# Patient Record
Sex: Female | Born: 1940 | Race: White | Hispanic: No | Smoking: Never smoker
Health system: Southern US, Community
[De-identification: ages and names within clinical notes are randomized; demographics above are authoritative.]

## PROBLEM LIST (undated history)

## (undated) DIAGNOSIS — K222 Esophageal obstruction: Secondary | ICD-10-CM

## (undated) DIAGNOSIS — H469 Unspecified optic neuritis: Secondary | ICD-10-CM

## (undated) DIAGNOSIS — K581 Irritable bowel syndrome with constipation: Secondary | ICD-10-CM

## (undated) DIAGNOSIS — K219 Gastro-esophageal reflux disease without esophagitis: Secondary | ICD-10-CM

## (undated) DIAGNOSIS — I10 Essential (primary) hypertension: Secondary | ICD-10-CM

## (undated) DIAGNOSIS — E669 Obesity, unspecified: Secondary | ICD-10-CM

## (undated) DIAGNOSIS — D649 Anemia, unspecified: Secondary | ICD-10-CM

## (undated) DIAGNOSIS — H6980 Other specified disorders of Eustachian tube, unspecified ear: Secondary | ICD-10-CM

## (undated) DIAGNOSIS — H543 Unqualified visual loss, both eyes: Secondary | ICD-10-CM

## (undated) DIAGNOSIS — G500 Trigeminal neuralgia: Secondary | ICD-10-CM

## (undated) DIAGNOSIS — I2510 Atherosclerotic heart disease of native coronary artery without angina pectoris: Secondary | ICD-10-CM

## (undated) DIAGNOSIS — K5790 Diverticulosis of intestine, part unspecified, without perforation or abscess without bleeding: Secondary | ICD-10-CM

## (undated) DIAGNOSIS — E785 Hyperlipidemia, unspecified: Secondary | ICD-10-CM

## (undated) DIAGNOSIS — E559 Vitamin D deficiency, unspecified: Secondary | ICD-10-CM

## (undated) DIAGNOSIS — G629 Polyneuropathy, unspecified: Secondary | ICD-10-CM

## (undated) DIAGNOSIS — F32A Depression, unspecified: Secondary | ICD-10-CM

## (undated) DIAGNOSIS — K279 Peptic ulcer, site unspecified, unspecified as acute or chronic, without hemorrhage or perforation: Secondary | ICD-10-CM

## (undated) DIAGNOSIS — F419 Anxiety disorder, unspecified: Secondary | ICD-10-CM

## (undated) DIAGNOSIS — R7989 Other specified abnormal findings of blood chemistry: Secondary | ICD-10-CM

## (undated) DIAGNOSIS — N318 Other neuromuscular dysfunction of bladder: Secondary | ICD-10-CM

## (undated) DIAGNOSIS — T7840XA Allergy, unspecified, initial encounter: Secondary | ICD-10-CM

## (undated) DIAGNOSIS — Z8719 Personal history of other diseases of the digestive system: Secondary | ICD-10-CM

## (undated) DIAGNOSIS — F329 Major depressive disorder, single episode, unspecified: Secondary | ICD-10-CM

## (undated) DIAGNOSIS — H6990 Unspecified Eustachian tube disorder, unspecified ear: Secondary | ICD-10-CM

## (undated) DIAGNOSIS — R945 Abnormal results of liver function studies: Secondary | ICD-10-CM

## (undated) DIAGNOSIS — K649 Unspecified hemorrhoids: Secondary | ICD-10-CM

## (undated) HISTORY — DX: Polyneuropathy, unspecified: G62.9

## (undated) HISTORY — DX: Other specified abnormal findings of blood chemistry: R79.89

## (undated) HISTORY — PX: ABDOMINAL HYSTERECTOMY: SHX81

## (undated) HISTORY — DX: Unspecified eustachian tube disorder, unspecified ear: H69.90

## (undated) HISTORY — PX: CAROTID ENDARTERECTOMY: SUR193

## (undated) HISTORY — DX: Hyperlipidemia, unspecified: E78.5

## (undated) HISTORY — DX: Unspecified hemorrhoids: K64.9

## (undated) HISTORY — DX: Other specified disorders of Eustachian tube, unspecified ear: H69.80

## (undated) HISTORY — DX: Allergy, unspecified, initial encounter: T78.40XA

## (undated) HISTORY — DX: Major depressive disorder, single episode, unspecified: F32.9

## (undated) HISTORY — DX: Trigeminal neuralgia: G50.0

## (undated) HISTORY — DX: Peptic ulcer, site unspecified, unspecified as acute or chronic, without hemorrhage or perforation: K27.9

## (undated) HISTORY — DX: Esophageal obstruction: K22.2

## (undated) HISTORY — DX: Depression, unspecified: F32.A

## (undated) HISTORY — PX: TONSILLECTOMY: SUR1361

## (undated) HISTORY — DX: Anemia, unspecified: D64.9

## (undated) HISTORY — DX: Other neuromuscular dysfunction of bladder: N31.8

## (undated) HISTORY — DX: Vitamin D deficiency, unspecified: E55.9

## (undated) HISTORY — PX: COLONOSCOPY: SHX174

## (undated) HISTORY — DX: Obesity, unspecified: E66.9

## (undated) HISTORY — DX: Unspecified optic neuritis: H46.9

## (undated) HISTORY — DX: Anxiety disorder, unspecified: F41.9

## (undated) HISTORY — DX: Essential (primary) hypertension: I10

## (undated) HISTORY — DX: Irritable bowel syndrome with constipation: K58.1

## (undated) HISTORY — DX: Personal history of other diseases of the digestive system: Z87.19

## (undated) HISTORY — DX: Gastro-esophageal reflux disease without esophagitis: K21.9

## (undated) HISTORY — DX: Abnormal results of liver function studies: R94.5

## (undated) HISTORY — DX: Unqualified visual loss, both eyes: H54.3

## (undated) HISTORY — DX: Diverticulosis of intestine, part unspecified, without perforation or abscess without bleeding: K57.90

## (undated) HISTORY — PX: APPENDECTOMY: SHX54

## (undated) HISTORY — DX: Atherosclerotic heart disease of native coronary artery without angina pectoris: I25.10

---

## 1999-04-12 ENCOUNTER — Inpatient Hospital Stay (HOSPITAL_COMMUNITY): Admission: EM | Admit: 1999-04-12 | Discharge: 1999-04-14 | Payer: Self-pay | Admitting: Internal Medicine

## 1999-04-13 ENCOUNTER — Encounter: Payer: Self-pay | Admitting: Internal Medicine

## 2000-09-24 DIAGNOSIS — K222 Esophageal obstruction: Secondary | ICD-10-CM

## 2000-09-24 HISTORY — PX: UPPER GASTROINTESTINAL ENDOSCOPY: SHX188

## 2000-09-24 HISTORY — DX: Esophageal obstruction: K22.2

## 2000-10-16 ENCOUNTER — Encounter: Payer: Self-pay | Admitting: *Deleted

## 2000-10-18 ENCOUNTER — Encounter (INDEPENDENT_AMBULATORY_CARE_PROVIDER_SITE_OTHER): Payer: Self-pay | Admitting: Specialist

## 2000-10-18 ENCOUNTER — Inpatient Hospital Stay (HOSPITAL_COMMUNITY): Admission: RE | Admit: 2000-10-18 | Discharge: 2000-10-20 | Payer: Self-pay | Admitting: *Deleted

## 2001-08-08 ENCOUNTER — Encounter: Payer: Self-pay | Admitting: Gastroenterology

## 2002-03-02 ENCOUNTER — Ambulatory Visit (HOSPITAL_COMMUNITY): Admission: RE | Admit: 2002-03-02 | Discharge: 2002-03-02 | Payer: Self-pay | Admitting: Gastroenterology

## 2002-03-02 ENCOUNTER — Encounter: Payer: Self-pay | Admitting: Gastroenterology

## 2002-04-21 ENCOUNTER — Encounter: Payer: Self-pay | Admitting: Neurology

## 2002-04-21 ENCOUNTER — Ambulatory Visit (HOSPITAL_COMMUNITY): Admission: RE | Admit: 2002-04-21 | Discharge: 2002-04-21 | Payer: Self-pay | Admitting: Neurology

## 2003-06-24 ENCOUNTER — Ambulatory Visit (HOSPITAL_COMMUNITY): Admission: RE | Admit: 2003-06-24 | Discharge: 2003-06-24 | Payer: Self-pay | Admitting: Internal Medicine

## 2003-06-24 ENCOUNTER — Encounter: Payer: Self-pay | Admitting: Internal Medicine

## 2003-12-20 ENCOUNTER — Inpatient Hospital Stay (HOSPITAL_COMMUNITY): Admission: EM | Admit: 2003-12-20 | Discharge: 2003-12-22 | Payer: Self-pay | Admitting: Internal Medicine

## 2004-02-23 ENCOUNTER — Ambulatory Visit (HOSPITAL_COMMUNITY): Admission: RE | Admit: 2004-02-23 | Discharge: 2004-02-23 | Payer: Self-pay | Admitting: Gastroenterology

## 2004-02-28 ENCOUNTER — Ambulatory Visit (HOSPITAL_COMMUNITY): Admission: RE | Admit: 2004-02-28 | Discharge: 2004-02-28 | Payer: Self-pay | Admitting: Gastroenterology

## 2004-05-24 ENCOUNTER — Encounter: Admission: RE | Admit: 2004-05-24 | Discharge: 2004-05-24 | Payer: Self-pay | Admitting: Orthopedic Surgery

## 2004-10-20 ENCOUNTER — Ambulatory Visit: Payer: Self-pay | Admitting: Internal Medicine

## 2004-10-25 ENCOUNTER — Inpatient Hospital Stay (HOSPITAL_COMMUNITY): Admission: RE | Admit: 2004-10-25 | Discharge: 2004-11-03 | Payer: Self-pay | Admitting: Psychiatry

## 2004-10-25 ENCOUNTER — Ambulatory Visit: Payer: Self-pay | Admitting: Psychiatry

## 2004-10-26 ENCOUNTER — Encounter (HOSPITAL_COMMUNITY): Payer: Self-pay | Admitting: Psychiatry

## 2004-11-09 ENCOUNTER — Ambulatory Visit: Payer: Self-pay | Admitting: Internal Medicine

## 2004-12-04 ENCOUNTER — Other Ambulatory Visit: Admission: RE | Admit: 2004-12-04 | Discharge: 2004-12-04 | Payer: Self-pay | Admitting: Gynecology

## 2004-12-05 ENCOUNTER — Ambulatory Visit: Payer: Self-pay | Admitting: Internal Medicine

## 2004-12-19 ENCOUNTER — Ambulatory Visit: Payer: Self-pay | Admitting: Internal Medicine

## 2005-02-01 ENCOUNTER — Ambulatory Visit: Payer: Self-pay | Admitting: Internal Medicine

## 2005-07-06 ENCOUNTER — Ambulatory Visit: Payer: Self-pay | Admitting: Internal Medicine

## 2005-07-10 ENCOUNTER — Ambulatory Visit: Payer: Self-pay | Admitting: Internal Medicine

## 2005-07-16 ENCOUNTER — Ambulatory Visit: Payer: Self-pay | Admitting: Internal Medicine

## 2005-07-27 ENCOUNTER — Ambulatory Visit: Payer: Self-pay | Admitting: Internal Medicine

## 2005-08-02 ENCOUNTER — Ambulatory Visit: Payer: Self-pay | Admitting: Internal Medicine

## 2005-08-07 ENCOUNTER — Ambulatory Visit: Payer: Self-pay

## 2005-08-08 ENCOUNTER — Ambulatory Visit: Payer: Self-pay | Admitting: Gastroenterology

## 2005-08-09 ENCOUNTER — Ambulatory Visit: Payer: Self-pay | Admitting: Gastroenterology

## 2005-08-09 ENCOUNTER — Encounter (INDEPENDENT_AMBULATORY_CARE_PROVIDER_SITE_OTHER): Payer: Self-pay | Admitting: *Deleted

## 2006-01-31 ENCOUNTER — Ambulatory Visit: Payer: Self-pay | Admitting: Internal Medicine

## 2006-08-01 ENCOUNTER — Ambulatory Visit: Payer: Self-pay | Admitting: Internal Medicine

## 2006-08-26 ENCOUNTER — Ambulatory Visit: Payer: Self-pay | Admitting: Gastroenterology

## 2006-08-26 ENCOUNTER — Ambulatory Visit (HOSPITAL_COMMUNITY): Admission: RE | Admit: 2006-08-26 | Discharge: 2006-08-26 | Payer: Self-pay | Admitting: Gastroenterology

## 2006-08-30 ENCOUNTER — Ambulatory Visit (HOSPITAL_COMMUNITY): Admission: RE | Admit: 2006-08-30 | Discharge: 2006-08-30 | Payer: Self-pay | Admitting: Gastroenterology

## 2006-09-04 ENCOUNTER — Ambulatory Visit: Payer: Self-pay | Admitting: Gastroenterology

## 2007-01-15 ENCOUNTER — Ambulatory Visit: Payer: Self-pay | Admitting: Gastroenterology

## 2007-02-06 ENCOUNTER — Ambulatory Visit: Payer: Self-pay | Admitting: Gastroenterology

## 2007-02-18 ENCOUNTER — Ambulatory Visit: Payer: Self-pay | Admitting: Gastroenterology

## 2007-04-17 ENCOUNTER — Ambulatory Visit: Payer: Self-pay | Admitting: Internal Medicine

## 2007-07-30 ENCOUNTER — Encounter: Payer: Self-pay | Admitting: Internal Medicine

## 2007-07-30 ENCOUNTER — Ambulatory Visit: Payer: Self-pay

## 2007-08-01 DIAGNOSIS — J3089 Other allergic rhinitis: Secondary | ICD-10-CM

## 2007-08-01 DIAGNOSIS — J4520 Mild intermittent asthma, uncomplicated: Secondary | ICD-10-CM | POA: Insufficient documentation

## 2007-08-01 DIAGNOSIS — H6980 Other specified disorders of Eustachian tube, unspecified ear: Secondary | ICD-10-CM

## 2007-08-01 DIAGNOSIS — J302 Other seasonal allergic rhinitis: Secondary | ICD-10-CM

## 2007-08-06 ENCOUNTER — Ambulatory Visit: Payer: Self-pay | Admitting: Internal Medicine

## 2007-09-01 ENCOUNTER — Telehealth (INDEPENDENT_AMBULATORY_CARE_PROVIDER_SITE_OTHER): Payer: Self-pay | Admitting: *Deleted

## 2007-09-02 ENCOUNTER — Encounter: Payer: Self-pay | Admitting: Internal Medicine

## 2007-09-03 ENCOUNTER — Ambulatory Visit: Payer: Self-pay | Admitting: Internal Medicine

## 2007-11-20 ENCOUNTER — Ambulatory Visit: Payer: Self-pay | Admitting: Internal Medicine

## 2007-11-20 DIAGNOSIS — I10 Essential (primary) hypertension: Secondary | ICD-10-CM | POA: Insufficient documentation

## 2007-12-04 ENCOUNTER — Ambulatory Visit: Payer: Self-pay | Admitting: Internal Medicine

## 2008-01-06 DIAGNOSIS — K644 Residual hemorrhoidal skin tags: Secondary | ICD-10-CM | POA: Insufficient documentation

## 2008-01-06 DIAGNOSIS — F323 Major depressive disorder, single episode, severe with psychotic features: Secondary | ICD-10-CM | POA: Insufficient documentation

## 2008-01-06 DIAGNOSIS — F411 Generalized anxiety disorder: Secondary | ICD-10-CM | POA: Insufficient documentation

## 2008-01-06 DIAGNOSIS — K648 Other hemorrhoids: Secondary | ICD-10-CM | POA: Insufficient documentation

## 2008-01-06 DIAGNOSIS — F418 Other specified anxiety disorders: Secondary | ICD-10-CM

## 2008-01-23 ENCOUNTER — Ambulatory Visit: Payer: Self-pay | Admitting: Internal Medicine

## 2008-01-23 LAB — CONVERTED CEMR LAB: Vit D, 1,25-Dihydroxy: 20 — ABNORMAL LOW (ref 30–89)

## 2008-01-26 LAB — CONVERTED CEMR LAB
ALT: 23 units/L (ref 0–35)
Alkaline Phosphatase: 110 units/L (ref 39–117)
Basophils Absolute: 0 10*3/uL (ref 0.0–0.1)
CO2: 32 meq/L (ref 19–32)
Calcium: 8.9 mg/dL (ref 8.4–10.5)
Chloride: 107 meq/L (ref 96–112)
Cholesterol: 290 mg/dL (ref 0–200)
Eosinophils Absolute: 0.1 10*3/uL (ref 0.0–0.7)
GFR calc Af Amer: 92 mL/min
GFR calc non Af Amer: 76 mL/min
HCT: 39.5 % (ref 36.0–46.0)
Hemoglobin: 13.2 g/dL (ref 12.0–15.0)
MCHC: 33.4 g/dL (ref 30.0–36.0)
MCV: 92 fL (ref 78.0–100.0)
Monocytes Absolute: 0.4 10*3/uL (ref 0.1–1.0)
Platelets: 342 10*3/uL (ref 150–400)
RDW: 12.9 % (ref 11.5–14.6)
Triglycerides: 121 mg/dL (ref 0–149)

## 2008-01-27 ENCOUNTER — Encounter: Payer: Self-pay | Admitting: Internal Medicine

## 2008-04-12 ENCOUNTER — Ambulatory Visit: Payer: Self-pay | Admitting: Internal Medicine

## 2008-04-12 LAB — CONVERTED CEMR LAB
Eosinophils Relative: 1.1 % (ref 0.0–5.0)
IgE (Immunoglobulin E), Serum: 46 intl units/mL (ref 0.0–180.0)
Lymphocytes Relative: 26.4 % (ref 12.0–46.0)
MCHC: 33.3 g/dL (ref 30.0–36.0)
Neutrophils Relative %: 67.2 % (ref 43.0–77.0)
Platelets: 322 10*3/uL (ref 150–400)
RBC: 4.51 M/uL (ref 3.87–5.11)
RDW: 12.9 % (ref 11.5–14.6)
WBC: 7 10*3/uL (ref 4.5–10.5)

## 2008-05-06 ENCOUNTER — Telehealth (INDEPENDENT_AMBULATORY_CARE_PROVIDER_SITE_OTHER): Payer: Self-pay | Admitting: *Deleted

## 2008-05-14 ENCOUNTER — Ambulatory Visit: Payer: Self-pay | Admitting: Internal Medicine

## 2008-05-14 DIAGNOSIS — R21 Rash and other nonspecific skin eruption: Secondary | ICD-10-CM | POA: Insufficient documentation

## 2008-05-19 ENCOUNTER — Ambulatory Visit: Payer: Self-pay | Admitting: Internal Medicine

## 2008-05-19 DIAGNOSIS — E559 Vitamin D deficiency, unspecified: Secondary | ICD-10-CM | POA: Insufficient documentation

## 2008-05-19 DIAGNOSIS — E785 Hyperlipidemia, unspecified: Secondary | ICD-10-CM | POA: Insufficient documentation

## 2008-05-24 ENCOUNTER — Ambulatory Visit: Payer: Self-pay | Admitting: Cardiology

## 2008-08-17 ENCOUNTER — Ambulatory Visit: Payer: Self-pay | Admitting: Internal Medicine

## 2008-08-17 DIAGNOSIS — I739 Peripheral vascular disease, unspecified: Secondary | ICD-10-CM

## 2008-08-17 DIAGNOSIS — Z8739 Personal history of other diseases of the musculoskeletal system and connective tissue: Secondary | ICD-10-CM | POA: Insufficient documentation

## 2008-08-17 LAB — CONVERTED CEMR LAB
BUN: 13 mg/dL (ref 6–23)
Calcium: 9.2 mg/dL (ref 8.4–10.5)
Chloride: 105 meq/L (ref 96–112)
Creatinine, Ser: 0.7 mg/dL (ref 0.4–1.2)
GFR calc Af Amer: 107 mL/min
GFR calc non Af Amer: 88 mL/min
Sodium: 141 meq/L (ref 135–145)

## 2008-08-27 ENCOUNTER — Telehealth: Payer: Self-pay | Admitting: Internal Medicine

## 2008-08-30 ENCOUNTER — Encounter: Payer: Self-pay | Admitting: Internal Medicine

## 2008-09-06 ENCOUNTER — Telehealth: Payer: Self-pay | Admitting: Internal Medicine

## 2008-11-12 ENCOUNTER — Ambulatory Visit: Payer: Self-pay | Admitting: Internal Medicine

## 2008-12-02 ENCOUNTER — Telehealth: Payer: Self-pay | Admitting: Internal Medicine

## 2008-12-02 ENCOUNTER — Ambulatory Visit: Payer: Self-pay | Admitting: Internal Medicine

## 2008-12-02 DIAGNOSIS — K620 Anal polyp: Secondary | ICD-10-CM | POA: Insufficient documentation

## 2008-12-02 DIAGNOSIS — K5900 Constipation, unspecified: Secondary | ICD-10-CM | POA: Insufficient documentation

## 2008-12-02 DIAGNOSIS — K5730 Diverticulosis of large intestine without perforation or abscess without bleeding: Secondary | ICD-10-CM | POA: Insufficient documentation

## 2008-12-02 DIAGNOSIS — R1084 Generalized abdominal pain: Secondary | ICD-10-CM

## 2008-12-02 DIAGNOSIS — K589 Irritable bowel syndrome without diarrhea: Secondary | ICD-10-CM

## 2008-12-02 DIAGNOSIS — K621 Rectal polyp: Secondary | ICD-10-CM

## 2008-12-02 LAB — CONVERTED CEMR LAB
Basophils Relative: 0.6 % (ref 0.0–3.0)
Eosinophils Relative: 2.1 % (ref 0.0–5.0)
HCT: 35.6 % — ABNORMAL LOW (ref 36.0–46.0)
Monocytes Absolute: 0.4 10*3/uL (ref 0.1–1.0)
Platelets: 329 10*3/uL (ref 150–400)
RDW: 13 % (ref 11.5–14.6)
WBC: 6.9 10*3/uL (ref 4.5–10.5)

## 2008-12-03 ENCOUNTER — Telehealth: Payer: Self-pay | Admitting: Physician Assistant

## 2008-12-08 ENCOUNTER — Telehealth: Payer: Self-pay | Admitting: Physician Assistant

## 2008-12-20 ENCOUNTER — Ambulatory Visit: Payer: Self-pay | Admitting: Internal Medicine

## 2009-02-17 ENCOUNTER — Encounter: Payer: Self-pay | Admitting: Internal Medicine

## 2009-03-04 ENCOUNTER — Ambulatory Visit: Payer: Self-pay | Admitting: Internal Medicine

## 2009-03-04 ENCOUNTER — Telehealth: Payer: Self-pay | Admitting: Internal Medicine

## 2009-03-04 LAB — CONVERTED CEMR LAB
Basophils Absolute: 0 10*3/uL (ref 0.0–0.1)
Bilirubin Urine: NEGATIVE
Hemoglobin: 12 g/dL (ref 12.0–15.0)
Ketones, ur: NEGATIVE mg/dL
MCV: 89.6 fL (ref 78.0–100.0)
Monocytes Relative: 8.5 % (ref 3.0–12.0)
Neutro Abs: 6.4 10*3/uL (ref 1.4–7.7)
Platelets: 242 10*3/uL (ref 150.0–400.0)
RBC: 3.92 M/uL (ref 3.87–5.11)
Specific Gravity, Urine: <=1.005 (ref 1.000–1.030)
WBC: 9.5 10*3/uL (ref 4.5–10.5)
pH: 5.5 (ref 5.0–8.0)

## 2009-03-07 ENCOUNTER — Telehealth: Payer: Self-pay | Admitting: Internal Medicine

## 2009-05-05 ENCOUNTER — Ambulatory Visit: Payer: Self-pay | Admitting: Internal Medicine

## 2009-05-05 DIAGNOSIS — M810 Age-related osteoporosis without current pathological fracture: Secondary | ICD-10-CM

## 2009-05-05 DIAGNOSIS — E663 Overweight: Secondary | ICD-10-CM

## 2009-05-05 DIAGNOSIS — M7710 Lateral epicondylitis, unspecified elbow: Secondary | ICD-10-CM | POA: Insufficient documentation

## 2009-05-12 ENCOUNTER — Telehealth: Payer: Self-pay | Admitting: Internal Medicine

## 2009-05-13 ENCOUNTER — Ambulatory Visit: Payer: Self-pay | Admitting: Internal Medicine

## 2009-05-13 LAB — CONVERTED CEMR LAB
Bilirubin Urine: NEGATIVE
Ketones, ur: NEGATIVE mg/dL
Nitrite: NEGATIVE
Urobilinogen, UA: 0.2 (ref 0.0–1.0)

## 2009-05-23 ENCOUNTER — Telehealth: Payer: Self-pay | Admitting: Internal Medicine

## 2009-06-07 ENCOUNTER — Encounter: Payer: Self-pay | Admitting: Internal Medicine

## 2009-06-29 ENCOUNTER — Ambulatory Visit: Payer: Self-pay | Admitting: Pulmonary Disease

## 2009-09-09 ENCOUNTER — Encounter: Payer: Self-pay | Admitting: Internal Medicine

## 2009-09-20 ENCOUNTER — Telehealth: Payer: Self-pay | Admitting: Internal Medicine

## 2009-11-11 ENCOUNTER — Ambulatory Visit: Payer: Self-pay | Admitting: Internal Medicine

## 2009-11-15 ENCOUNTER — Ambulatory Visit (HOSPITAL_COMMUNITY)
Admission: RE | Admit: 2009-11-15 | Discharge: 2009-11-15 | Payer: Self-pay | Source: Home / Self Care | Admitting: Internal Medicine

## 2009-11-22 ENCOUNTER — Telehealth (INDEPENDENT_AMBULATORY_CARE_PROVIDER_SITE_OTHER): Payer: Self-pay | Admitting: *Deleted

## 2009-12-09 ENCOUNTER — Ambulatory Visit: Payer: Self-pay | Admitting: Internal Medicine

## 2010-02-06 ENCOUNTER — Encounter: Payer: Self-pay | Admitting: Internal Medicine

## 2010-04-28 ENCOUNTER — Encounter: Payer: Self-pay | Admitting: Internal Medicine

## 2010-05-02 ENCOUNTER — Encounter: Payer: Self-pay | Admitting: Internal Medicine

## 2010-05-08 ENCOUNTER — Ambulatory Visit: Payer: Self-pay | Admitting: Internal Medicine

## 2010-05-08 ENCOUNTER — Telehealth: Payer: Self-pay | Admitting: Internal Medicine

## 2010-05-08 LAB — CONVERTED CEMR LAB
CRP, High Sensitivity: 2.98 (ref 0.00–5.00)
Hemoglobin: 13.4 g/dL (ref 12.0–15.0)
Lymphs Abs: 2.5 10*3/uL (ref 0.7–4.0)
MCV: 91 fL (ref 78.0–100.0)
Monocytes Relative: 5.4 % (ref 3.0–12.0)
Neutro Abs: 4.5 10*3/uL (ref 1.4–7.7)
Neutrophils Relative %: 59.2 % (ref 43.0–77.0)

## 2010-05-09 ENCOUNTER — Telehealth: Payer: Self-pay | Admitting: Internal Medicine

## 2010-05-09 ENCOUNTER — Encounter: Payer: Self-pay | Admitting: Internal Medicine

## 2010-05-11 ENCOUNTER — Ambulatory Visit: Payer: Self-pay | Admitting: Internal Medicine

## 2010-05-11 DIAGNOSIS — H469 Unspecified optic neuritis: Secondary | ICD-10-CM | POA: Insufficient documentation

## 2010-05-11 LAB — CONVERTED CEMR LAB
Alkaline Phosphatase: 117 units/L (ref 39–117)
Bilirubin, Direct: 0.1 mg/dL (ref 0.0–0.3)
CO2: 24 meq/L (ref 19–32)
Cholesterol: 287 mg/dL — ABNORMAL HIGH (ref 0–200)
Creatinine, Ser: 0.9 mg/dL (ref 0.4–1.2)
GFR calc non Af Amer: 70.11 mL/min (ref >60)
Magnesium: 2.3 mg/dL (ref 1.5–2.5)
Potassium: 4.3 meq/L (ref 3.5–5.1)
Sodium: 141 meq/L (ref 135–145)
TSH: 0.35 microintl units/mL (ref 0.35–5.50)
Total CHOL/HDL Ratio: 4
Triglycerides: 84 mg/dL (ref 0.0–149.0)
Vitamin B-12: 250 pg/mL (ref 211–911)

## 2010-05-12 ENCOUNTER — Telehealth: Payer: Self-pay | Admitting: Internal Medicine

## 2010-05-25 ENCOUNTER — Telehealth: Payer: Self-pay | Admitting: Internal Medicine

## 2010-05-26 ENCOUNTER — Encounter: Payer: Self-pay | Admitting: Internal Medicine

## 2010-06-26 ENCOUNTER — Telehealth: Payer: Self-pay | Admitting: Internal Medicine

## 2010-07-06 ENCOUNTER — Ambulatory Visit: Payer: Self-pay | Admitting: Internal Medicine

## 2010-07-06 LAB — CONVERTED CEMR LAB
Basophils Absolute: 0.1 10*3/uL (ref 0.0–0.1)
Basophils Relative: 1.3 % (ref 0.0–3.0)
Eosinophils Relative: 4 % (ref 0.0–5.0)
HDL: 65.2 mg/dL (ref >39.00)
Hemoglobin: 12.9 g/dL (ref 12.0–15.0)
MCHC: 34.2 g/dL (ref 30.0–36.0)
MCV: 90.4 fL (ref 78.0–100.0)
Monocytes Absolute: 0.4 10*3/uL (ref 0.1–1.0)
Platelets: 248 10*3/uL (ref 150.0–400.0)
RBC: 4.18 M/uL (ref 3.87–5.11)
RDW: 14.4 % (ref 11.5–14.6)
Sed Rate: 32 mm/hr — ABNORMAL HIGH (ref 0–22)
Total CHOL/HDL Ratio: 3
Triglycerides: 87 mg/dL (ref 0.0–149.0)

## 2010-07-10 ENCOUNTER — Telehealth: Payer: Self-pay | Admitting: Internal Medicine

## 2010-07-13 ENCOUNTER — Telehealth: Payer: Self-pay | Admitting: Internal Medicine

## 2010-08-09 ENCOUNTER — Encounter: Payer: Self-pay | Admitting: Internal Medicine

## 2010-09-15 ENCOUNTER — Ambulatory Visit (HOSPITAL_COMMUNITY)
Admission: RE | Admit: 2010-09-15 | Discharge: 2010-09-15 | Payer: Self-pay | Source: Home / Self Care | Attending: Psychiatry | Admitting: Psychiatry

## 2010-09-18 ENCOUNTER — Emergency Department (HOSPITAL_COMMUNITY)
Admission: EM | Admit: 2010-09-18 | Discharge: 2010-09-20 | Disposition: A | Discharge status: Other Acute Inpatient Hospital | Payer: Self-pay | Source: Home / Self Care | Admitting: Emergency Medicine

## 2010-10-15 ENCOUNTER — Encounter: Payer: Self-pay | Admitting: Internal Medicine

## 2010-10-26 NOTE — Letter (Signed)
Summary: [facility]   Imported By: Lester Tatamy 05/09/2010 08:15:12  _____________________________________________________________________  External Attachment:    Type:   Image     Comment:   External Document

## 2010-10-26 NOTE — Assessment & Plan Note (Signed)
Summary: YEARLY F/U / # / CD--OK MEN   Vital Signs:  Patient profile:   70 year old female Height:      61 inches Weight:      154 pounds BMI:     29.20 O2 Sat:      96 % on Room air Temp:     98.0 degrees F oral Pulse rate:   76 / minute BP sitting:   140 / 88  (left arm) Cuff size:   regular  Vitals Entered By: Bill Salinas CMA (May 11, 2010 10:43 AM)  O2 Flow:  Room air CC: Pt her for yearly physical/ ab  Does patient need assistance? Ambulation Impaired:Risk for fall Comments Pt lives at [facility] where her meals are prepared for her.   Primary Care Taquanna Borras:  Illene Regulus,  MD  CC:  Pt her for yearly physical/ ab.  History of Present Illness: Patient presents for annual physical exam. She has recently had sudden loss of vision in the right eye approximately 1 week ago. 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 With the sudden loss of vision she was seen by Dr. Elmer Picker. She subsequently has had MRI August 8th that was unremarkable including normal occular and  vascular structures. She was seen by neuroopthalmology at [facility] with a working diagnosis of ischemic optic neuritis. She did have an ESR 36. She has seen Dr. Pollyann Kennedy who will be doing a temporal artery biopsy. She has had additional lab with a normal CRP and CBC. She was seen by Dr. 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  Patient sees Dr. Donell Beers. 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   She has no other medical complaints.   Preventive Screening-Counseling & Management  Alcohol-Tobacco     Alcohol drinks/day: 0     Smoking Status: never  Caffeine-Diet-Exercise     Caffeine use/day: 1 cup daily     Does Patient Exercise: yes     Type of exercise: walking/ arm strecthes     Exercise (avg: min/session): <30     Times/week:  <3  Hep-HIV-STD-Contraception     Dental Visit-last 6 months no     Sun Exposure-Excessive: no  Safety-Violence-Falls     Seat Belt Use: yes     Firearms in the Home: no firearms in the home     Smoke Detectors: yes     Violence in the Home: no risk noted     Sexual Abuse: no     Fall Risk: no falls in the past year      Drug Use:  never.    Current Medications (verified): 1)  Amitriptyline Hcl 50 Mg  Tabs (Amitriptyline Hcl) .... Take One Tablet At Bedtime 2)  Zantac 150 Mg  Tabs (Ranitidine Hcl) .... Take 1 Tablet By Mouth Once A Day As Needed 3)  Chlordiazepoxide Hcl 25 Mg  Caps (Chlordiazepoxide Hcl) .... Take One Capsule  At Bedtime 4)  Chlordiazepoxide Hcl 10 Mg  Caps (Chlordiazepoxide Hcl) .... Take By Mouth As Needed 5)  Miralax   Powd (Polyethylene Glycol 3350) .... Take 1 Dose Daily Adjust For Effect 6)  Proair Hfa 108 (90 Base) Mcg/act  Aers (Albuterol Sulfate) .... 2 Puffs Four Times A Day As Needed 7)  Caltrate 600+d 600-400 Mg-Unit  Tabs (Calcium Carbonate-Vitamin D) .... Take 1  Tablet By Mouth Two Times A Day 8)  Kyolic Liquid .... 1/4 Teaspoon Once Daily 9)  Fish Oil 1000 Mg  Caps (Omega-3 Fatty Acids) .... Take 1 Tablet By Mouth Two Times A Day 10)  Flonase 50 Mcg/act  Susp (Fluticasone Propionate) .Marland Kitchen.. 1-2 Puffs Each Nostril Daily 11)  Alprazolam 0.25 Mg  Tabs (Alprazolam) .... Take 1/2 By Mouth As Needed 12)  Ambien 10 Mg Tabs (Zolpidem Tartrate) .... Take 1/2 At Bedtime If Needed. 13)  Vitamin D 2000 Unit Tabs (Cholecalciferol) .... Once Daily 14)  Coq 10 .... Take 1 Tablet By Mouth Once A Day 15)  Furosemide 20 Mg Tabs (Furosemide) .Marland Kitchen.. 1 By Mouth Qam Diuretic For Peripheral Edema 16)  Aclovate 0.05 % Crea (Alclometasone Dipropionate) .... As Needed 17)  Udo Choice Mvi For Seniors .... Take 1 Tablet By Mouth Once A Day As Needed  Allergies (verified): 1)  ! * Antibiotics Frequently Cause Thrush 2)  ! Penicillin 3)  ! Sulfa 4)  ! Doxycycline 5)  ! Zithromax  (Azithromycin) 6)  ! * Magic Mouthwash 7)  ! 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 father - deceased @ 57: CAD/MI, AAA mother - deceased @ 15: alzheimer;s Neg- breast or colon cancer; DM Sister ovarian cancer brother - lung cancer, AAA  Social History: Reviewed history from 12/09/2009 and no changes required. HSG, business college Married '59- 12yrs/ divorced 2 sons - '60,  '66; 2 granddaughters : Diplomatic Services operational officer, travel Solicitor. Lives - friends home in a studio apartment. Patient never smoked. Did have second hand snmoke exposure childhood and work. Caffeine use/day:  1 cup daily Does Patient Exercise:  yes Dental Care w/in 6 mos.:  no Sun Exposure-Excessive:  no Seat Belt Use:  yes Fall Risk:  no falls in the past year Drug Use:  never  Review of Systems  The patient denies anorexia, fever, weight loss, weight gain, chest pain, syncope, dyspnea on exertion, hemoptysis, abdominal pain, severe indigestion/heartburn, incontinence, transient blindness, difficulty walking, depression, abnormal bleeding, enlarged lymph nodes, and angioedema.    Physical Exam  General:  heavy-set white female in no acute distress who is very voluable in regard to her delusions. Head:  normocephalic, atraumatic, and no abnormalities observed.   Eyes:  deferred to recent exams Ears:  R ear normal and L ear normal.   Nose:  no external deformity and no external erythema.   Mouth:  no oral lesions noted. Neck:  supple, full ROM, and no thyromegaly.   Chest  Wall:  no deformities.   Breasts:  deferred Lungs:  normal respiratory effort, no intercostal retractions, no accessory muscle use, normal breath sounds, and no wheezes.   Heart:  normal rate and regular rhythm.   Abdomen:  soft, non-tender, and normal bowel sounds.   Genitalia:  deferred Msk:  no joint tenderness, no joint swelling, no joint warmth, and no redness over joints.   Pulses:  2+ radial puse Extremities:  no edema or deformity Neurologic:  alert & oriented X3 and cranial nerves II-XII intact except that vision was not checked. EOMI. Strength normal. Normal gait.   Skin:  turgor normal, color normal, and no rashes.  Cervical Nodes:  no anterior cervical adenopathy and no posterior cervical adenopathy.   Psych:  Oriented X3, memory intact for recent and remote, and normally interactive.  Relates a very structured and detailed paranoid delusion which she relays to other staff in a consistent fashion.   Impression & Recommendations:  Problem # 1:  OPTIC NEURITIS (ICD-377.30) Patient with acute visual loss with a working diagnosis, per neuroopthalmology at [facility], ischemic optic neuritis. Both ESR and CRP are not significantly elevated. No palpable temporal artery on exam.   Plan - she is seeing Dr. Azzie Roup for rheumatology and she has been started on high dose steroids           she is scheduled for a temporal artery biopsy.           She will continue to follow with Dr. Elmer Picker  Orders: TLB-Lipid Panel (80061-LIPID) TLB-Hepatic/Liver Function Pnl (80076-HEPATIC) TLB-TSH (Thyroid Stimulating Hormone) (84443-TSH) TLB-B12 + Folate Pnl (21308_65784-O96/EXB) TLB-Magnesium (Mg) (83735-MG)  Problem # 2:  OSTEOPOROSIS (ICD-733.00) Chart reviewed: diagnosed by Dr. Greta Doom in '09. she was intolerant of bisphosphonates. She saw Dr. Talmage Nap for endocrinology in '10 and was started on Forteo. Patient also with a history of vitamin D deficiency. No recent DXA available for review. Patient  does not report taking forteo at this time. She does take vitamin D.  Plan - copy to Dr. Greta Doom who may have a recent DXA scan.  Problem # 3:  IRRITABLE BOWEL SYNDROME (ICD-564.1) Patient has no GI complaints at this time  Problem # 4:  DYSLIPIDEMIA (ICD-272.4)  Lab reveals LDL 195! she is not on medical therapy. In a setting of possible ischemic neuritis will need to work with her to take medical therapy and will try to start with crestor 10mg  once daily.   Her updated medication list for this problem includes:    Crestor 10 Mg Tabs (Rosuvastatin calcium) .Marland Kitchen... 1 by mouth qpm for control of cholesterol  Problem # 5:  ANXIETY (ICD-300.00) Patient continues to follow with Dr. Donell Beers. She seems stable and is living in a semi-supervised setting. Her delusion does not seem to physically threaten her and although she is paranoid she does comply with her medical regimen.  Plan - defer medication management to Dr. Donell Beers  Her updated medication list for this problem includes:    Amitriptyline Hcl 50 Mg Tabs (Amitriptyline hcl) .Marland Kitchen... Take one tablet at bedtime    Chlordiazepoxide Hcl 25 Mg Caps (Chlordiazepoxide hcl) .Marland Kitchen... Take one capsule  at bedtime    Chlordiazepoxide Hcl 10 Mg Caps (Chlordiazepoxide hcl) .Marland Kitchen... Take by mouth as needed    Alprazolam 0.25 Mg Tabs (Alprazolam) .Marland Kitchen... Take 1/2 by mouth as needed  Problem # 6:  ELEVATED BLOOD PRESSURE WITHOUT DIAGNOSIS OF HYPERTENSION (ICD-796.2)  Her updated medication list for this problem includes:    Furosemide 20 Mg Tabs (Furosemide) .Marland Kitchen... 1 by mouth qam diuretic for peripheral edema  Orders: TLB-BMP (Basic Metabolic Panel-BMET) (80048-METABOL)  BP today: 140/88 Prior BP: 114/74 (12/09/2009)  Adequate control on daily furosemide  Problem # 7:  ASTHMA (ICD-493.90) Last visit to Dr. Maple Hudson in March '11 at which time she was stable. She is asymptomatic at today's visit.  Her updated medication list for this problem includes:     Proair Hfa 108 (90 Base) Mcg/act Aers (Albuterol sulfate) .Marland Kitchen... 2 puffs four times a day as needed  Problem # 8:  Preventive Health Care (ICD-V70.0)  Problems as listed. Exam is limited but aside from opthal  issues normal. She is not complaining of facial pain-previously addressed by Dr. Sandria Manly. Lab is significant for very elevated lipids but otherwise normal. Last colonoscopy '08. No mammogram report in EMR but she does see Dr. Greta Doom who may have recent reports. Immunizations: tetnus '10, pneumonia vaccine '10.  Patient lives at [facility]- she does require assistance with ADLs due to vision loss but is independent in personal care. She has well eveloped delusions but does not demonstrate signs of depression. She has increased fall and injury risk due to her visual limitations but is in a safe environment and does exercise caution.  Orders: [facility] -Subsequent Annual Wellness Visit [phone])  Complete Medication List: 1)  Amitriptyline Hcl 50 Mg Tabs (Amitriptyline hcl) .... Take one tablet at bedtime 2)  Zantac 150 Mg Tabs (Ranitidine hcl) .... Take 1 tablet by mouth once a day as needed 3)  Chlordiazepoxide Hcl 25 Mg Caps (Chlordiazepoxide hcl) .... Take one capsule  at bedtime 4)  Chlordiazepoxide Hcl 10 Mg Caps (Chlordiazepoxide hcl) .... Take by mouth as needed 5)  Miralax Powd (Polyethylene glycol 3350) .... Take 1 dose daily adjust for effect 6)  Proair Hfa 108 (90 Base) Mcg/act Aers (Albuterol sulfate) .... 2 puffs four times a day as needed 7)  Caltrate 600+d 600-400 Mg-unit Tabs (Calcium carbonate-vitamin d) .... Take 1 tablet by mouth two times a day 8)  Kyolic Liquid  .... 1/4 teaspoon once daily 9)  Fish Oil 1000 Mg Caps (Omega-3 fatty acids) .... Take 1 tablet by mouth two times a day 10)  Flonase 50 Mcg/act Susp (Fluticasone propionate) .Marland Kitchen.. 1-2 puffs each nostril daily 11)  Alprazolam 0.25 Mg Tabs (Alprazolam) .... Take 1/2 by mouth as needed 12)  Ambien 10 Mg Tabs (Zolpidem  tartrate) .... Take 1/2 at bedtime if needed. 13)  Vitamin D 2000 Unit Tabs (Cholecalciferol) .... Once daily 14)  Coq 10  .... Take 1 tablet by mouth once a day 15)  Furosemide 20 Mg Tabs (Furosemide) .Marland Kitchen.. 1 by mouth qam diuretic for peripheral edema 16)  Aclovate 0.05 % Crea (Alclometasone dipropionate) .... As needed 17)  Udo Choice Mvi For Seniors  .... Take 1 tablet by mouth once a day as needed 18)  Crestor 10 Mg Tabs (Rosuvastatin calcium) .Marland Kitchen.. 1 by mouth qpm for control of cholesterol   Patient: Jean [name] Note: All result statuses are Final unless otherwise noted.  Tests: (1) BMP (METABOL)   Sodium                    141 mEq/L                   135-145   Potassium                 4.3 mEq/L                   3.5-5.1   Chloride                  105 mEq/L                   96-112   Carbon Dioxide            24 mEq/L                    19-32   Glucose                   88  mg/dL                    16-10   BUN                  [H]  25 mg/dL                    9-60   Creatinine                0.9 mg/dL                   4.5-4.0   Calcium                   9.3 mg/dL                   9.8-11.9   GFR                       70.11 mL/min                >60  Tests: (2) Lipid Panel (LIPID)   Cholesterol          [H]  287 mg/dL                   1-478     ATP III Classification            Desirable:  < 200 mg/dL                    Borderline High:  200 - 239 mg/dL               High:  > = 240 mg/dL   Triglycerides             84.0 mg/dL                  2.9-562.1     Normal:  <150 mg/dL     Borderline High:  308 - 199 mg/dL   HDL                       65.78 mg/dL                 >46.96   VLDL Cholesterol          16.8 mg/dL                  2.9-52.8  CHO/HDL Ratio:  CHD Risk                             4                    Men          Women     1/2 Average Risk     3.4          3.3     Average Risk          5.0          4.4     2X Average Risk          9.6          7.1     3X  Average Risk          15.0          11.0  Tests: (3) Hepatic/Liver Function Panel (HEPATIC)   Total Bilirubin           0.5 mg/dL                   0.4-5.4   Direct Bilirubin          0.1 mg/dL                   0.9-8.1   Alkaline Phosphatase      117 U/L                     39-117   AST                       25 U/L                      0-37   ALT                       22 U/L                      0-35   Total Protein             6.8 g/dL                    1.9-1.4   Albumin                   4.0 g/dL                    7.8-2.9  Tests: (4) TSH (TSH)   FastTSH                   0.35 uIU/mL                 0.35-5.50  Tests: (5) B12 + Folate Panel (B12/FOL)   Vitamin B12               250 pg/mL                   211-911   Folate                    12.6 ng/mL     Deficient  0.4 - 3.4 ng/mL     Indeterminate  3.4 - 5.4 ng/mL     Normal  >5.4 ng/mL  Tests: (6) Magnesium (MG)   Magnesium                 2.3 mg/dL                   5.6-2.1  Tests: (7) Cholesterol LDL - Direct (DIRLDL)  Cholesterol LDL - Direct                             195.0 mg/dL     Optimal:  <308 mg/dL     Near or Above Optimal:  100-129 mg/dL     Borderline High:  657-846 mg/dL     High:  962-952 mg/dL     Very High:  >841 mg/dL Prescriptions: CRESTOR 10 MG TABS (ROSUVASTATIN CALCIUM) 1 by mouth qpm for control of cholesterol  #30 x 12   Entered and Authorized by:   Jacques Navy MD   Signed by:  Jacques Navy MD on 05/12/2010   Method used:   Electronically to        [facility] (retail)       [address]       [place], [place]  [address]       Ph: [phone] or [phone]       Fax: [phone]   RxID:   [id number]   Preventive Care Screening  Last Flu Shot:    Date:  07/05/2009    Results:  given

## 2010-10-26 NOTE — Assessment & Plan Note (Signed)
Summary: rov 1 month ///kp   Primary Provider/Referring Provider:  Illene Regulus,  MD  CC:  follow up visit.  History of Present Illness: 11/12/08-Asthma, allergic rhinitis, eustachian dysfunction Good winter. Plans to go on a vegetarian diet.had seasonal flu vax. No colds at all. 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 She would like to lose weight. Had a pleuritic pain after bruising rib- saw orthopod and is resolving. Uses Proair rescue inhaler very occasionally- helps.   November 11, 2009- Asthma, allergic rhinitis, eustachian dysfunction Saw Dr Shelle Iron for acut visit in October.  After trip in late September she has felt pressure frontal area and tender right eyebrow ridge  Sometimes blows from nose. Today tender in nose. For a year, if she bends over, she has been able to hear some wheeze. Needs flu vax.  December 09, 2009- Asthma, allergic rhinitis, eustachian dysfunction She notes some mild frontal headache c/w pollen, but not bad enough that she wants to do anything. CT sinuses -normal CXR- low lung volumes chronic, some basilar atelectasis, NAD stable.      Current Medications (verified): 1)  Amitriptyline Hcl 50 Mg  Tabs (Amitriptyline Hcl) .... Take One Tablet At Bedtime 2)  Zantac 150 Mg  Tabs (Ranitidine Hcl) .... Take 1 Tablet By Mouth Once A Day As Needed 3)  Chlordiazepoxide Hcl 25 Mg  Caps (Chlordiazepoxide Hcl) .... Take One Capsule  At Bedtime 4)  Chlordiazepoxide Hcl 10 Mg  Caps (Chlordiazepoxide Hcl) .... Take By Mouth As Needed 5)  Miralax   Powd (Polyethylene Glycol 3350) .... Take 1 Dose Daily Adjust For Effect 6)  Proair Hfa 108 (90 Base) Mcg/act  Aers (Albuterol Sulfate) .... 2 Puffs Four Times A Day As Needed 7)  Caltrate 600+d 600-400 Mg-Unit  Tabs (Calcium Carbonate-Vitamin D) .... Take 1 Tablet By Mouth Two Times A Day 8)  Kyolic Liquid .... 1/4 Teaspoon Once Daily 9)  Fish Oil 1000 Mg  Caps  (Omega-3 Fatty Acids) .... Take 1 Tablet By Mouth Two Times A Day 10)  Flonase 50 Mcg/act  Susp (Fluticasone Propionate) .Marland Kitchen.. 1-2 Puffs Each Nostril Daily 11)  Alprazolam 0.25 Mg  Tabs (Alprazolam) .... Take 1/2 By Mouth As Needed 12)  Ambien 10 Mg Tabs (Zolpidem Tartrate) .... Take 1/2 At Bedtime If Needed. 13)  Vitamin D 2000 Unit Tabs (Cholecalciferol) .... Once Daily 14)  Coq 10 .... Take 1 Tablet By Mouth Once A Day 15)  Furosemide 20 Mg Tabs (Furosemide) .Marland Kitchen.. 1 By Mouth Qam Diuretic For Peripheral Edema 16)  Aclovate 0.05 % Crea (Alclometasone Dipropionate) .... As Needed 17)  Udo Choice Mvi For Seniors .... Take 1 Tablet By Mouth Once A Day As Needed  Allergies (verified): 1)  ! * Antibiotics Frequently Cause Thrush 2)  ! Penicillin 3)  ! Sulfa 4)  ! Doxycycline 5)  ! Zithromax (Azithromycin) 6)  ! * Magic Mouthwash 7)  ! * Shellfish  Past History:  Past Medical History: Last updated: 12/20/2008 ESOPHAGEAL REFLUX (ICD-530.81) EUSTACHIAN TUBE DYSFUNCTION (ICD-381.81) ALLERGIC RHINITIS (ICD-477.9) ASTHMA (ICD-493.90) irritable bowel/ constipation diverticulosis peptic ulcer disease osteoporosis peripheral neuropathy trigeminal neuralgia (prior injury)   Past Surgical History: Last updated: 12/04/2007 tonsillectomy appendectomy hysterectomy carotid endarterectomy  Family History: Last updated: 08-30-2008 father - deceased @ 10: CAD/MI, AAA mother - deceased @ 65: alzheimer;s Neg- breast or colon cancer; DM Sister ovarian cancer brother - lung cancer, AAA  Social History: Last updated: 12/09/2009 HSG,  business college Married '59- 70yrs/ divorced 2 sons - '60,  '66; 2 granddaughters : Diplomatic Services operational officer, travel Solicitor. Lives - friends home in a studio apartment. Patient never smoked. Did have second hand snmoke exposure childhood and work.  Risk Factors: Smoking Status: never (12/04/2007)  Social History: HSG, business college Married '59- 10yrs/ divorced 2  sons - '60,  '66; 2 granddaughters : Diplomatic Services operational officer, travel Solicitor. Lives - friends home in a studio apartment. Patient never smoked. Did have second hand snmoke exposure childhood and work.  Review of Systems      See HPI  The patient denies anorexia, fever, weight loss, weight gain, vision loss, decreased hearing, hoarseness, chest pain, syncope, dyspnea on exertion, peripheral edema, prolonged cough, headaches, hemoptysis, abdominal pain, and severe indigestion/heartburn.    Vital Signs:  Patient profile:   70 year old female Height:      61 inches Weight:      154.25 pounds BMI:     29.25 O2 Sat:      98 % on Room air Pulse rate:   84 / minute BP sitting:   114 / 74  (left arm) Cuff size:   regular  Vitals Entered By: Reynaldo Minium CMA (December 09, 2009 2:39 PM)  O2 Flow:  Room air  Physical Exam  Additional Exam:  General: A/Ox3; pleasant and cooperative, NAD, SKIN: no rash, lesions NODES: no lymphadenopathy HEENT: [place]/AT, EOM- WNL, Conjuctivae- clear, PERRLA, TM-WNL, Nose- clear, Throat- clear and wnl,. Mallampati  II NECK: Supple w/ fair ROM, JVD- none, normal carotid impulses w/o bruits Thyroid-  CHEST: Clear to P&A HEART: RRR, no m/g/r heard ABDOMEN: Soft and nl; XBM:WUXL, nl pulses, no edema  NEURO: Grossly intact to observation      Impression & Recommendations:  Problem # 1:  ALLERGIC RHINITIS (ICD-477.9)  Flonase is doing well. Right now she doesn't need more.  Her updated medication list for this problem includes:    Flonase 50 Mcg/act Susp (Fluticasone propionate) .Marland Kitchen... 1-2 puffs each nostril daily  Problem # 2:  ASTHMA (ICD-493.90) Good control   Other Orders: Est. Patient Level III (24401)  Patient Instructions: 1)  Please schedule a follow-up appointment in 1 year. 2)  Continue the fluticasone/ flonase nose spray through the Spring. Call if needed sooner.

## 2010-10-26 NOTE — Letter (Signed)
Summary: [facility]  [facility]   Imported By: Sherian Rein 02/09/2010 15:01:20  _____________________________________________________________________  External Attachment:    Type:   Image     Comment:   External Document

## 2010-10-26 NOTE — Progress Notes (Signed)
Summary: CRESTOR  Phone Note Call from Patient Call back at Home Phone [phone]   Summary of Call: Patient is requesting generic alternative to crestor.  Initial call taken by: Lamar Sprinkles, CMA,  June 26, 2010 8:57 AM  Follow-up for Phone Call        1) No -With the medical problems present - occlusion of retinal vessels causing blindness and the need to dramatically reduce cholesterol from LDL=195 I do not believe there is a generic that can be given at a safe dose that will equal crestor. 2) needs follow-up lab since starting crestor - with dose adjustment as needed to get LDl to 80 or less.  272.4  Follow-up by: Jacques Navy MD,  June 26, 2010 9:32 AM  Additional Follow-up for Phone Call Additional follow up Details #1::        Left vm with above and to call office regarding need for labs.  Additional Follow-up by: Lamar Sprinkles, CMA,  June 26, 2010 5:34 PM    Additional Follow-up for Phone Call Additional follow up Details #2::    Pt returned call and had questions, left mess to call office back..............Marland KitchenLamar Sprinkles, CMA  June 28, 2010 5:51 PM   no answer...............Marland KitchenLamar Sprinkles, CMA  June 29, 2010 6:10 PM   Spoke with patient, she will continue crestor. Pt also needs, labs from Dr Elmer Picker, Order in [facility].  Follow-up by: Lamar Sprinkles, CMA,  July 03, 2010 11:40 AM

## 2010-10-26 NOTE — Letter (Signed)
Summary: [facility]  [facility]   Imported By: Lester Tishomingo 09/26/2009 08:00:13  _____________________________________________________________________  External Attachment:    Type:   Image     Comment:   External Document

## 2010-10-26 NOTE — Progress Notes (Signed)
Summary: CALL  Phone Note Call from Patient   Summary of Call: Patient is requesting a call back, has additional questions regarding last phone note.  Initial call taken by: Lamar Sprinkles, CMA,  May 25, 2010 9:46 AM  Follow-up for Phone Call        Spoke w/pt, she would like copy of labs mailed to her home, done. Pt wants to know if we can test her prednisone levels. She is being treated by Dr Dareen Piano w/60mg  daily. She says she feels no change from being on medication.   Per MD, no test for prednisone level avail, needs to talk to dr Dareen Piano. Follow-up by: Lamar Sprinkles, CMA,  May 25, 2010 11:53 AM  Additional Follow-up for Phone Call Additional follow up Details #1::        Pt informed, she says she will discuss with dr Elmer Picker who is "the head of all this" and see what she says.  Additional Follow-up by: Lamar Sprinkles, CMA,  May 25, 2010 11:54 AM

## 2010-10-26 NOTE — Progress Notes (Signed)
  Phone Note From Other Clinic   Summary of Call: Dr [facility] office is requesting pt to have labs at our office so she can take to MD at [facility]. They will fax order for requested labs to my attention.  Initial call taken by: Lamar Sprinkles, CMA,  May 08, 2010 9:48 AM  Follow-up for Phone Call        Patient brought in lab request. See EMR, labs were completed.  Follow-up by: Lamar Sprinkles, CMA,  May 09, 2010 1:47 PM

## 2010-10-26 NOTE — Letter (Signed)
Summary: [facility]  [facility]   Imported By: Sherian Rein 08/15/2010 12:13:04  _____________________________________________________________________  External Attachment:    Type:   Image     Comment:   External Document

## 2010-10-26 NOTE — Progress Notes (Signed)
Summary: cholestrol level  Phone Note Call from Patient   Caller: Patient Summary of Call: Wants to know results of blood work, specifically her cholesterol level--good, bad, together, since been on crestor.  Initial call taken by: Alysia Penna,  July 13, 2010 2:13 PM  Follow-up for Phone Call        LDL 86 - fabulous, HDL good, liver functions normal, CRP normal Follow-up by: Jacques Navy MD,  July 13, 2010 3:56 PM  Additional Follow-up for Phone Call Additional follow up Details #1::        informed pt of results Additional Follow-up by: Ami Bullins CMA,  July 13, 2010 4:04 PM

## 2010-10-26 NOTE — Progress Notes (Signed)
Summary: Referral  Phone Note Outgoing Call   Summary of Call: Plese call patient: cholesterol is very high. Needs to start crestor 10mg  once a day in PM. Rx eScribed to [facility] [address] Will need follow-up lab: lipid 272.4, hepatic 995.20, in 3 weeks. Remainder of lab normal. Initial call taken by: Jacques Navy MD,  May 12, 2010 4:38 AM  Follow-up for Phone Call        Phone busy, will try back later Follow-up by: Ami Bullins CMA,  May 12, 2010 11:49 AM  Additional Follow-up for Phone Call Additional follow up Details #1::        pt said she has tried crestor and it affects her muscles.  what do you advise? Additional Follow-up by: Ami Bullins CMA,  May 12, 2010 4:18 PM    Additional Follow-up for Phone Call Additional follow up Details #2::    she will need to be referred to lipid clinic. [facility] notified Follow-up by: Jacques Navy MD,  May 12, 2010 5:17 PM  Additional Follow-up for Phone Call Additional follow up Details #3:: Details for Additional Follow-up Action Taken: Left message on machine to call back to office. Lucious Groves CMA  May 15, 2010 1:32 PM   Per [facility] notes pt declined apt................Marland KitchenLamar Sprinkles, CMA  May 20, 2010 9:37 AM   Called pt: she has decided to take crestor.  Additional Follow-up by: Jacques Navy MD,  May 22, 2010 12:14 PM

## 2010-10-26 NOTE — Assessment & Plan Note (Signed)
Summary: 12 months/apc   Primary Provider/Referring Provider:  Illene Regulus,  MD  CC:  Yearly follow up visit-rattles/wheezing at times. .  History of Present Illness: 05/14/08- 70 year old woman returns for follow-up of allergic rhinitis and asthma.  Was complaining last visit that her face hurt.  Associated this with flushing or rash on cheeks of her chest and arms.  She went to an urgent care and was referred to for a second opinion  Dermatology evaluation after being given Depo-Medrol and prednisone.  The dermatology visit is pending.  She uses no make up.  Her first dermatologist biopsied. her skin and gave a cortisone topical preparation.  She still puts a cool pack on her upper chest at bed time.  She is watching her diet and exercising and with this  feels better. Laboratory: CBC, normal.  In vitro/RAST testing negative, IgE 46.  11/12/08-Asthma, allergic rhinitis, eustachian dysfunction Good winter. Plans to go on a vegetarian diet.had seasonal flu vax. No colds at all. 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 She would like to lose weight. Had a pleuritic pain after bruising rib- saw orthopod and is resolving. Uses Proair rescue inhaler very occasionally- helps.   November 11, 2009- Asthma, allergic rhinitis, eustachian dysfunction Saw Dr Shelle Iron for acut visit in October.  After trip in late September she has felt pressure frontal area and tender right eyebrow ridge  Sometimes blows from nose. Today tender in nose. For a year, if she bends over, she has been able to hear some wheeze. Needs flu vax.   Current Medications (verified): 1)  Amitriptyline Hcl 50 Mg  Tabs (Amitriptyline Hcl) .... Take One Tablet At Bedtime 2)  Zantac 150 Mg  Tabs (Ranitidine Hcl) .... Take 1 Tablet By Mouth Once A Day As Needed 3)  Chlordiazepoxide Hcl 25 Mg  Caps (Chlordiazepoxide Hcl) .... Take One Capsule  At Bedtime 4)  Chlordiazepoxide Hcl 10  Mg  Caps (Chlordiazepoxide Hcl) .... Take By Mouth As Needed 5)  Miralax   Powd (Polyethylene Glycol 3350) .... Take 1 Dose Daily Adjust For Effect 6)  Proair Hfa 108 (90 Base) Mcg/act  Aers (Albuterol Sulfate) .... 2 Puffs Four Times A Day As Needed 7)  Caltrate 600+d 600-400 Mg-Unit  Tabs (Calcium Carbonate-Vitamin D) .... Take 1 Tablet By Mouth Two Times A Day 8)  Kyolic Liquid .... 1/4 Teaspoon Once Daily 9)  Fish Oil 1000 Mg  Caps (Omega-3 Fatty Acids) .... Take 1 Tablet By Mouth Two Times A Day 10)  Flonase 50 Mcg/act  Susp (Fluticasone Propionate) .Marland Kitchen.. 1-2 Puffs Each Nostril Daily 11)  Alprazolam 0.25 Mg  Tabs (Alprazolam) .... Take 1/2 By Mouth As Needed 12)  Ambien 10 Mg Tabs (Zolpidem Tartrate) .... Take 1/2 At Bedtime If Needed. 13)  Vitamin D 2000 Unit Tabs (Cholecalciferol) .... Once Daily 14)  Coq 10 .... Take 1 Tablet By Mouth Once A Day 15)  Furosemide 20 Mg Tabs (Furosemide) .Marland Kitchen.. 1 By Mouth Qam Diuretic For Peripheral Edema 16)  Aclovate 0.05 % Crea (Alclometasone Dipropionate) .... As Needed 17)  Udo Choice Mvi For Seniors .... Take 1 Tablet By Mouth Once A Day As Needed  Allergies (verified): 1)  ! * Antibiotics Frequently Cause Thrush 2)  ! Penicillin 3)  ! Sulfa 4)  ! Doxycycline 5)  ! Zithromax (Azithromycin) 6)  ! * Magic Mouthwash 7)  ! * Shellfish  Past History:  Past Medical History: Last  updated: 12/20/2008 ESOPHAGEAL REFLUX (ICD-530.81) EUSTACHIAN TUBE DYSFUNCTION (ICD-381.81) ALLERGIC RHINITIS (ICD-477.9) ASTHMA (ICD-493.90) irritable bowel/ constipation diverticulosis peptic ulcer disease osteoporosis peripheral neuropathy trigeminal neuralgia (prior injury)   Past Surgical History: Last updated: 12/04/2007 tonsillectomy appendectomy hysterectomy carotid endarterectomy  Family History: Last updated: Sep 11, 2008 father - deceased @ 40: CAD/MI, AAA mother - deceased @ 61: alzheimer;s Neg- breast or colon cancer; DM Sister ovarian  cancer brother - lung cancer, AAA  Social History: Last updated: 11-Sep-2008 HSG, business college Married '59- 41yrs/ divorced 2 sons - '60,  '66; 2 granddaughters : Diplomatic Services operational officer, travel Solicitor. Lives - friends home in a studio apartment. Patient never smoked.   Risk Factors: Smoking Status: never (12/04/2007)  Review of Systems      See HPI  The patient denies anorexia, fever, weight loss, weight gain, vision loss, decreased hearing, hoarseness, chest pain, syncope, dyspnea on exertion, peripheral edema, prolonged cough, headaches, hemoptysis, abdominal pain, and severe indigestion/heartburn.    Vital Signs:  Patient profile:   70 year old female Height:      61 inches Weight:      151.50 pounds BMI:     28.73 O2 Sat:      98 % on Room air Pulse rate:   82 / minute BP sitting:   118 / 80  (left arm) Cuff size:   regular  Vitals Entered By: Reynaldo Minium CMA (November 11, 2009 2:19 PM)  O2 Flow:  Room air  Physical Exam  Additional Exam:  General: A/Ox3; pleasant and cooperative, NAD, SKIN: no rash, lesions NODES: no lymphadenopathy HEENT: [place]/AT, EOM- WNL, Conjuctivae- clear, PERRLA, TM-WNL, Nose- clear, Throat- clear and wnl,. Mallampati  II NECK: Supple w/ fair ROM, JVD- none, normal carotid impulses w/o bruits Thyroid-  CHEST: Clear to P&A HEART: RRR, no m/g/r heard ABDOMEN: Soft and nl; KGM:WNUU, nl pulses, no edema  NEURO: Grossly intact to observation      Impression & Recommendations:  Problem # 1:  ALLERGIC RHINITIS (ICD-477.9)  She complains of tenderness and we discussed options. I can get limited CTwith question as to whether she is describing a frontal sinusitis. Her updated medication list for this problem includes:    Flonase 50 Mcg/act Susp (Fluticasone propionate) .Marland Kitchen... 1-2 puffs each nostril daily  Problem # 2:  ASTHMA (ICD-493.90) She is anxious. I don't hear much now, but we will update CXR.  Medications Added to Medication List This  Visit: 1)  Udo Choice Mvi For Seniors  .... Take 1 tablet by mouth once a day as needed  Other Orders: Est. Patient Level III (72536) T-2 View CXR (71020TC) Radiology Referral (Radiology)  Patient Instructions: 1)  Please schedule a follow-up appointment in 1 month. 2)  A chest x-ray has been recommended.  Your imaging study may require preauthorization.  3)  A Limited Sinus CT has been recommended.  Your imaging study may require preauthorization.  4)  Scripts for [employer] were sent to your drug store Prescriptions: FLONASE 50 MCG/ACT  SUSP (FLUTICASONE PROPIONATE) 1-2 puffs each nostril daily  #1 x prn   Entered and Authorized by:   Waymon Budge MD   Signed by:   Waymon Budge MD on 11/11/2009   Method used:   Electronically to        [id number] ([facility])       [address]       [place], [place]  [address]       Ph: [phone] or [phone]  Fax: [phone]   RxID:   [id number] PROAIR HFA 108 (90 BASE) MCG/ACT  AERS (ALBUTEROL SULFATE) 2 puffs four times a day as needed  #1 x 12   Entered and Authorized by:   Waymon Budge MD   Signed by:   Waymon Budge MD on 11/11/2009   Method used:   Electronically to        [id number] (retail)       [address]       [place], [place]  [address]       Ph: [phone] or [phone]       Fax: [phone]   RxID:   [id number]

## 2010-10-26 NOTE — Progress Notes (Signed)
Summary: UPDATE  Phone Note From Other Clinic   Summary of Call: Spoke with Dr Elmer Picker - She spoke w/Dr Dareen Piano this afternoon who will be contacting the patient for consult today or first thing in the am. Dr Dareen Piano does high dose solumedrol in his office. Pt was also seen by Dr Pollyann Kennedy this am who will be setting pt up for termporal artery biopsy later this week. Both Dr Elmer Picker & Dareen Piano are leaning against admission at this time.  Initial call taken by: Lamar Sprinkles, CMA,  May 09, 2010 4:02 PM  Follow-up for Phone Call        [facility]. Info noted. Follow-up by: Jacques Navy MD,  May 10, 2010 5:01 AM

## 2010-10-26 NOTE — Letter (Signed)
Summary: [facility]  [facility]   Imported By: Lester [place] 05/11/2010 10:27:24  _____________________________________________________________________  External Attachment:    Type:   Image     Comment:   External Document

## 2010-10-26 NOTE — Letter (Signed)
Summary: [facility]  [facility]   Imported By: Lester [place]man 06/07/2010 12:53:56  _____________________________________________________________________  External Attachment:    Type:   Image     Comment:   External Document

## 2010-10-26 NOTE — Progress Notes (Signed)
Summary: test results  Phone Note Call from Patient Call back at Home Phone [phone]   Caller: Patient Call For: young Summary of Call: Wants results of ct sinus Initial call taken by: Darletta Moll,  November 22, 2009 9:38 AM  Follow-up for Phone Call        called and spoke with pt. pt aware of ct and cxr results.  Aundra Millet Reynolds LPN  November 23, 6211 9:42 AM

## 2010-10-26 NOTE — Letter (Signed)
Summary: [facility]  [facility]   Imported By: Sherian Rein 05/31/2010 15:49:31  _____________________________________________________________________  External Attachment:    Type:   Image     Comment:   External Document

## 2010-10-26 NOTE — Progress Notes (Signed)
Summary: Recent labs   Phone Note Other Incoming   Caller: Dr Deirdre Evener office Summary of Call: please fax 07-06-10 labs to Dr. Deirdre Evener office @ [phone]. Initial call taken by: Lanier Prude, [facility]),  July 10, 2010 2:51 PM  Follow-up for Phone Call        Done Follow-up by: Lamar Sprinkles, CMA,  July 12, 2010 5:49 PM

## 2010-10-26 NOTE — Letter (Signed)
Summary: [facility]  [facility]   Imported By: Lennie Odor 09/29/2009 12:03:24  _____________________________________________________________________  External Attachment:    Type:   Image     Comment:   External Document

## 2010-10-26 NOTE — Letter (Signed)
Summary: [facility]   Imported By: Lester Ilion 05/18/2010 09:37:50  _____________________________________________________________________  External Attachment:    Type:   Image     Comment:   External Document

## 2010-10-26 NOTE — Letter (Signed)
Summary: [facility]  [facility]   Imported By: Lester [place] 05/11/2010 10:25:55  _____________________________________________________________________  External Attachment:    Type:   Image     Comment:   External Document

## 2010-10-31 ENCOUNTER — Other Ambulatory Visit: Payer: Self-pay | Admitting: Internal Medicine

## 2010-10-31 ENCOUNTER — Ambulatory Visit (INDEPENDENT_AMBULATORY_CARE_PROVIDER_SITE_OTHER): Payer: MEDICARE | Admitting: Internal Medicine

## 2010-10-31 ENCOUNTER — Encounter (INDEPENDENT_AMBULATORY_CARE_PROVIDER_SITE_OTHER): Payer: Self-pay | Admitting: *Deleted

## 2010-10-31 ENCOUNTER — Encounter: Payer: Self-pay | Admitting: Internal Medicine

## 2010-10-31 ENCOUNTER — Other Ambulatory Visit: Payer: Medicare Other

## 2010-10-31 DIAGNOSIS — R030 Elevated blood-pressure reading, without diagnosis of hypertension: Secondary | ICD-10-CM

## 2010-10-31 DIAGNOSIS — H469 Unspecified optic neuritis: Secondary | ICD-10-CM

## 2010-10-31 DIAGNOSIS — E785 Hyperlipidemia, unspecified: Secondary | ICD-10-CM

## 2010-10-31 DIAGNOSIS — F329 Major depressive disorder, single episode, unspecified: Secondary | ICD-10-CM

## 2010-10-31 LAB — BASIC METABOLIC PANEL
BUN: 19 mg/dL (ref 6–23)
CO2: 27 mEq/L (ref 19–32)
GFR: 67.27 mL/min (ref >60.00)
Glucose, Bld: 94 mg/dL (ref 70–99)
Potassium: 4.2 mEq/L (ref 3.5–5.1)

## 2010-10-31 LAB — HEPATIC FUNCTION PANEL
ALT: 20 U/L (ref 0–35)
AST: 25 U/L (ref 0–37)
Alkaline Phosphatase: 115 U/L (ref 39–117)
Bilirubin, Direct: 0.1 mg/dL (ref 0.0–0.3)
Total Bilirubin: 0.3 mg/dL (ref 0.3–1.2)
Total Protein: 7.7 g/dL (ref 6.0–8.3)

## 2010-10-31 LAB — LIPID PANEL
LDL Cholesterol: 89 mg/dL (ref 0–99)
Triglycerides: 94 mg/dL (ref 0.0–149.0)
VLDL: 18.8 mg/dL (ref 0.0–40.0)

## 2010-11-01 ENCOUNTER — Telehealth: Payer: Self-pay | Admitting: Internal Medicine

## 2010-11-08 ENCOUNTER — Telehealth: Payer: Self-pay | Admitting: Internal Medicine

## 2010-11-08 ENCOUNTER — Encounter: Payer: Self-pay | Admitting: Internal Medicine

## 2010-11-09 NOTE — Assessment & Plan Note (Signed)
Summary: rx consult-lb   Vital Signs:  Patient profile:   70 year old female Height:      61 inches Weight:      146 pounds BMI:     27.69 O2 Sat:      98 % on Room air Temp:     97.5 degrees F oral Pulse rate:   80 / minute BP sitting:   154 / 92  (left arm) Cuff size:   regular  Vitals Entered By: Bill Salinas CMA (October 31, 2010 11:24 AM)  O2 Flow:  Room air CC: ov to discuss medications   Primary Care Provider:  Illene Regulus,  MD  CC:  ov to discuss medications.  History of Present Illness: Patinet presents to discuss her supplements and medications. 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 She is contemplating cataract surgery left eye.  she is concerned about the supplements she is taking. 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 She does take several psychotropic drugs under the direction of Dr. Donell Beers. She is asking for renewals on alprazolam, Remus Loffler which will be deferred to Dr. Donell Beers.   She is asking for monitoring of her lipids.   Current Medications (verified): 1)  Amitriptyline Hcl 50 Mg  Tabs (Amitriptyline Hcl) .... Take One Tablet At Bedtime 2)  Zantac 150 Mg  Tabs (Ranitidine Hcl) .... Take 1 Tablet By Mouth Once A Day As Needed 3)  Chlordiazepoxide Hcl 25 Mg  Caps (Chlordiazepoxide Hcl) .... Take One Capsule  At Bedtime 4)  Chlordiazepoxide Hcl 10 Mg  Caps (Chlordiazepoxide Hcl) .... Take By Mouth As Needed 5)  Miralax   Powd (Polyethylene Glycol 3350) .... Take 1 Dose Daily Adjust For Effect 6)  Proair Hfa 108 (90 Base) Mcg/act  Aers (Albuterol Sulfate) .... 2 Puffs Four Times A Day As Needed 7)  Caltrate 600+d 600-400 Mg-Unit  Tabs (Calcium Carbonate-Vitamin D) .... Take 1 Tablet By Mouth Two Times A Day 8)  Kyolic Liquid .... 1/4 Teaspoon Once Daily 9)  Fish Oil 1000 Mg  Caps (Omega-3 Fatty Acids) .... Take 1 Tablet By Mouth Two Times A Day 10)  Flonase 50  Mcg/act  Susp (Fluticasone Propionate) .Marland Kitchen.. 1-2 Puffs Each Nostril Daily 11)  Alprazolam 0.25 Mg  Tabs (Alprazolam) .... Take 1/2 By Mouth As Needed 12)  Ambien 10 Mg Tabs (Zolpidem Tartrate) .... Take 1/2 At Bedtime If Needed. 13)  Vitamin D 2000 Unit Tabs (Cholecalciferol) .... Once Daily 14)  Coq 10 .... Take 1 Tablet By Mouth Once A Day 15)  Furosemide 20 Mg Tabs (Furosemide) .Marland Kitchen.. 1 By Mouth Qam Diuretic For Peripheral Edema 16)  Aclovate 0.05 % Crea (Alclometasone Dipropionate) .... As Needed 17)  Udo Choice Mvi For Seniors .... Take 1 Tablet By Mouth Once A Day As Needed 18)  Crestor 10 Mg Tabs (Rosuvastatin Calcium) .Marland Kitchen.. 1 By Mouth Qpm For Control of Cholesterol 19)  Centrum Silver  Tabs (Multiple Vitamins-Minerals) .Marland Kitchen.. 1 Tab Once Daily 20)  Docusate Sodium 100 Mg Tabs (Docusate Sodium) .Marland Kitchen.. 1 Tablet Two Times A Day 21)  Lamictal 25 Mg Tabs (Lamotrigine) .Marland Kitchen.. 1 Two Times A Day 22)  Aspirin 81 Mg Tbec (Aspirin) .Marland Kitchen.. 1 Tablet Once Daily  Allergies (verified): 1)  ! * Antibiotics Frequently Cause Thrush 2)  ! Penicillin 3)  ! Sulfa 4)  ! Doxycycline 5)  ! Zithromax (Azithromycin) 6)  ! * Magic Mouthwash  7)  ! 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 Lives - [facility] in a studio apartment. Patient never smoked. Did have second hand snmoke exposure childhood and work.  Family History: Reviewed history from 2008-08-21 and no changes required. father - deceased @ 27: CAD/MI, AAA mother - deceased @ 3: alzheimer;s Neg- breast or colon cancer; DM Sister ovarian cancer brother - lung cancer, AAA  Social History: Reviewed history from 12/09/2009 and no changes required. HSG, business college Married '59- 68yrs/ divorced 2 sons - '60,  '66; 2 granddaughters : Diplomatic Services operational officer, travel Solicitor. Lives - [facility] in a studio apartment. Patient never smoked. Did have second hand snmoke exposure childhood and work.  Review of Systems       The patient complains of vision loss.  The patient denies anorexia, fever, weight loss, weight gain, hoarseness, chest pain, dyspnea on exertion, prolonged cough, abdominal pain, severe indigestion/heartburn, muscle weakness, difficulty walking, unusual weight change, and angioedema.    Physical Exam  General:  elderly white woman in no distress. Head:  normocephalic, atraumatic, and no abnormalities palpated.   Eyes:  vision grossly intact, pupils equal, pupils round, and corneas and lenses clear.   Ears:  R ear normal and L ear normal.   Nose:  no external deformity and no external erythema.   Neck:  supple and full ROM.  Easily palpable carotid artery on the left Chest Wall:  no deformities and no tenderness.   Lungs:  normal respiratory effort and normal breath sounds.   Heart:  normal rate and regular rhythm.   Abdomen:  soft and normal bowel sounds.   Msk:  no joint tenderness, no joint swelling, and no joint warmth.   Pulses:  2+ radial Neurologic:  alert & oriented X3.  blind. gait normal Cervical Nodes:  no anterior cervical adenopathy and no posterior  cervical adenopathy.   Psych:  oriented to person and place. No overt hallucinosis or psychotic  behavior.    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 She has been resistent to aggressive managment of hyperlipidemia.  Plan - defer to opthalmology  Problem # 2:  DYSLIPIDEMIA (ICD-272.4) She has been taking crestor, after much cajoling  Plan - routine lab follow -up  Her updated medication list for this problem includes:    Crestor 10 Mg Tabs (Rosuvastatin calcium) .Marland Kitchen... 1 by mouth qpm for control of cholesterol  Orders: TLB-Lipid Panel (80061-LIPID) TLB-Hepatic/Liver Function Pnl (80076-HEPATIC)  addendum - LDL 89 - at goal of less than 100  Problem # 3:  DEPRESSION (ICD-311) Patient with significant psychiatric issues now followed by Dr. Donell Beers. Will defer psychotropic medication management to him.  The following medications were removed from the medication list:    Alprazolam 0.25 Mg Tabs (Alprazolam) .Marland KitchenMarland KitchenMarland KitchenMarland Kitchen  Take 1/2 by mouth as needed Her updated medication list for this problem includes:    Amitriptyline Hcl 75 Mg Tabs (Amitriptyline hcl) .Marland Kitchen... 1 by mouth at bedtime    Chlordiazepoxide Hcl 25 Mg Caps (Chlordiazepoxide hcl) .Marland Kitchen... Take one capsule  at bedtime    Chlordiazepoxide Hcl 10 Mg Caps (Chlordiazepoxide hcl) .Marland Kitchen... Take by mouth as needed  Problem # 4:  ELEVATED BLOOD PRESSURE WITHOUT DIAGNOSIS OF HYPERTENSION (ICD-796.2) Her updated medication list for this problem includes:    Furosemide 20 Mg Tabs (Furosemide) .Marland Kitchen... 1 by mouth qam diuretic for peripheral edema  Orders: TLB-BMP (Basic Metabolic Panel-BMET) (80048-METABOL)  BP today: 154/92 Prior BP: 140/88 (05/11/2010)  suboptimal control but will not change medications at this time.  Complete Medication List: 1)  Amitriptyline Hcl 75 Mg Tabs (Amitriptyline hcl) .Marland Kitchen.. 1 by mouth at bedtime 2)  Chlordiazepoxide Hcl 25 Mg Caps (Chlordiazepoxide hcl) ....  Take one capsule  at bedtime 3)  Chlordiazepoxide Hcl 10 Mg Caps (Chlordiazepoxide hcl) .... Take by mouth as needed 4)  Proair Hfa 108 (90 Base) Mcg/act Aers (Albuterol sulfate) .... 2 puffs four times a day as needed 5)  Caltrate 600+d 600-400 Mg-unit Tabs (Calcium carbonate-vitamin d) .... Take 1 tablet by mouth two times a day 6)  Kyolic Liquid  .... 1/4 teaspoon once daily 7)  Fish Oil 1000 Mg Caps (Omega-3 fatty acids) .... Take 1 tablet by mouth two times a day 8)  Flonase 50 Mcg/act Susp (Fluticasone propionate) .Marland Kitchen.. 1-2 puffs each nostril daily 9)  Vitamin D 2000 Unit Tabs (Cholecalciferol) .... Once daily 10)  Coq 10  .... Take 1 tablet by mouth once a day 11)  Furosemide 20 Mg Tabs (Furosemide) .Marland Kitchen.. 1 by mouth qam diuretic for peripheral edema 12)  Aclovate 0.05 % Crea (Alclometasone dipropionate) .... As needed 13)  Udo Choice Mvi For Seniors  .... Take 1 tablet by mouth once a day as needed 14)  Crestor 10 Mg Tabs (Rosuvastatin calcium) .Marland Kitchen.. 1 by mouth qpm for control of cholesterol 15)  Centrum Silver Tabs (Multiple vitamins-minerals) .Marland Kitchen.. 1 tab once daily 16)  Docusate Sodium 100 Mg Tabs (Docusate sodium) .Marland Kitchen.. 1 tablet two times a day 17)  Lamictal 25 Mg Tabs (Lamotrigine) .Marland Kitchen.. 1 two times a day 18)  Aspirin 81 Mg Tbec (Aspirin) .Marland Kitchen.. 1 tablet once daily 19)  Navane 2 Mg Caps (Thiothixene) .Marland Kitchen.. 1 by mouth three times a day  Patient: KALILAH BARUA Note: All result statuses are Final unless otherwise noted.  Tests: (1) Lipid Panel (LIPID)   Cholesterol               191 mg/dL                   0-347     ATP III Classification            Desirable:  < 200 mg/dL                    Borderline High:  200 - 239 mg/dL               High:  > = 240 mg/dL   Triglycerides             94.0 mg/dL                  4.2-595.6     Normal:  <150 mg/dL     Borderline High:  387 - 199 mg/dL  HDL                       16.10 mg/dL                 >96.04   VLDL Cholesterol          18.8 mg/dL                   5.4-09.8   LDL Cholesterol           89 mg/dL                    1-19  CHO/HDL Ratio:  CHD Risk                             2                    Men          Women     1/2 Average Risk     3.4          3.3     Average Risk          5.0          4.4     2X Average Risk          9.6          7.1     3X Average Risk          15.0          11.0                           Tests: (2) Hepatic/Liver Function Panel (HEPATIC)   Total Bilirubin           0.3 mg/dL                   1.4-7.8   Direct Bilirubin          0.1 mg/dL                   2.9-5.6   Alkaline Phosphatase      115 U/L                     39-117   AST                       25 U/L                      0-37   ALT                       20 U/L                      0-35   Total Protein             7.7 g/dL                    2.1-3.0   Albumin                   4.2 g/dL                    8.6-5.7  Tests: (3) BMP (METABOL)   Sodium  142 mEq/L                   135-145   Potassium                 4.2 mEq/L                   3.5-5.1   Chloride                  102 mEq/L                   96-112   Carbon Dioxide            27 mEq/L                    19-32   Glucose                   94 mg/dL                    16-10   BUN                       19 mg/dL                    9-60   Creatinine                0.9 mg/dL                   4.5-4.0   Calcium                   9.4 mg/dL                   9.8-11.9   GFR                       67.27 mL/min                >60.00  Patient Instructions: 1)  The list of medications below is a reconciliation to the list from [facility] and our list. In regard to sleeping medications or meds for anxiety like xanax  Dr. Donell Beers should be managing these. . There is no Problem with any of your supplements. 2)  Cholesterol  - for lab today 3)  Opthal - per Dr. Elmer Picker   Orders Added: 1)  TLB-Lipid Panel [80061-LIPID] 2)  TLB-Hepatic/Liver Function Pnl  [80076-HEPATIC] 3)  TLB-BMP (Basic Metabolic Panel-BMET) [80048-METABOL] 4)  Est. Patient Level IV [14782]

## 2010-11-10 ENCOUNTER — Other Ambulatory Visit: Payer: 59

## 2010-11-10 ENCOUNTER — Encounter (INDEPENDENT_AMBULATORY_CARE_PROVIDER_SITE_OTHER): Payer: Self-pay | Admitting: *Deleted

## 2010-11-10 ENCOUNTER — Other Ambulatory Visit: Payer: Self-pay | Admitting: Internal Medicine

## 2010-11-10 ENCOUNTER — Telehealth: Payer: Self-pay | Admitting: Internal Medicine

## 2010-11-10 DIAGNOSIS — N3000 Acute cystitis without hematuria: Secondary | ICD-10-CM

## 2010-11-10 LAB — URINALYSIS, ROUTINE W REFLEX MICROSCOPIC
Bilirubin Urine: NEGATIVE
Ketones, ur: NEGATIVE
Urine Glucose: NEGATIVE
pH: 6 (ref 5.0–8.0)

## 2010-11-13 ENCOUNTER — Telehealth: Payer: Self-pay | Admitting: Internal Medicine

## 2010-11-13 ENCOUNTER — Encounter: Payer: Self-pay | Admitting: Internal Medicine

## 2010-11-15 NOTE — Progress Notes (Signed)
Summary: Urination problems  Phone Note Call from Patient   Caller: ([phone] - Jean [name], [facility] nurse) Summary of Call: Pt left vm at 5:02pm today- unable to urinate today. She took fluid pill in the am and was able to urinate. Says now it wore off and can not urinate - drank large amount of water. Pt wants order to go to [facility] for additional med to help her urinate.  Initial call taken by: Lamar Sprinkles, CMA,  November 08, 2010 5:45 PM  Follow-up for Phone Call        Attempted to call pt - no answer. [facility]. Advised that pt's MD is out of office and no other MD's avail at this time except on call MD. Nurse will access patient and if she is unable to urinate will do in and out cath to keep pt out of the ER tonight. If there are any further problems or complications nurse will call office and speak w/oncall for advisement take pt to ER.  Nurse or patient will call office first thing in the am with update Follow-up by: Lamar Sprinkles, CMA,  November 08, 2010 5:52 PM  Additional Follow-up for Phone Call Additional follow up Details #1::        Update? is the patient able to urinate? Did she have large volume at I&O cath? If still having a problem with urinary retention please contact psychiatry about possible problems with lamictal or navane.  Additional Follow-up by: Jacques Navy MD,  November 09, 2010 1:19 PM    Additional Follow-up for Phone Call Additional follow up Details #2::    Spoke w/nurse - she did I & O b/c pt was c/o not being able to urinate. There was only 95 cc and nurse feels that she is not having retention problems. Pt reports that she feels better today. Now has some bowel complaints but they are resolved from otc laxatives. Nurse will monitor and call office if needed. Follow-up by: Lamar Sprinkles, CMA,  November 09, 2010 4:15 PM

## 2010-11-15 NOTE — Progress Notes (Signed)
Summary: ?'s  Phone Note Call from Patient Call back at [phone]   Summary of Call: Patient is requesting a call back - has questions about cholesterol med. Initial call taken by: Lamar Sprinkles, CMA,  November 01, 2010 3:38 PM  Follow-up for Phone Call        left mess to call office back..........Marland KitchenLamar Sprinkles, CMA  November 01, 2010 5:57 PM   Pt had to stop her cholesterol medication b/c it was causing muscle pain. Also wants to ask more questions....................Marland KitchenLamar Sprinkles, CMA  November 03, 2010 4:35 PM   Additional Follow-up for Phone Call Additional follow up Details #1::        Spoke w/pt  1. She says crestor caused leg pain and weakness. She would like alt rx that is generic.  2. Forgot other questions - she will call office back when she remembers.  Additional Follow-up by: Lamar Sprinkles, CMA,  November 06, 2010 4:55 PM    Additional Follow-up for Phone Call Additional follow up Details #2::    1. for cholesterol : gemfibrozil 600mg : take 1/2 tablet daily x 5 days, 1/2 tablet two times a day x 5 days, 1 tablet Am 1/2 table PM x 5 days then 1 tablet two times a day. Lipid panel in 6 weeks  272.4  Follow-up by: Jacques Navy MD,  November 06, 2010 5:24 PM  Additional Follow-up for Phone Call Additional follow up Details #3:: Details for Additional Follow-up Action Taken: Pt will need written order to go to [facility] Fax [phone] - stop crestor and start new med. Pt wants to take generic lipitor 10mg .............Marland KitchenLamar Sprinkles, CMA  November 07, 2010 3:03 PM   OK for lipitor 10mg  qPM. Additional Follow-up by: Jacques Navy MD,  November 08, 2010 12:38 PM  New/Updated Medications: GEMFIBROZIL 600 MG TABS (GEMFIBROZIL) 1 by mouth bid

## 2010-11-15 NOTE — Progress Notes (Signed)
Summary: UA-RESULTS READY  Phone Note Call from Patient Call back at Home Phone [phone] P PH     Caller: Patient Reason for Call: Talk to Nurse Summary of Call: REQUEST UA, HAVING SOME PAIN Initial call taken by: Migdalia Dk,  November 10, 2010 10:31 AM  Follow-up for Phone Call        [facility] per MD, hold MD open for results. Follow-up by: Lamar Sprinkles, CMA,  November 10, 2010 10:35 AM  Additional Follow-up for Phone Call Additional follow up Details #1::        U/a ready, please advise. Additional Follow-up by: Lamar Sprinkles, CMA,  November 10, 2010 2:48 PM    Additional Follow-up for Phone Call Additional follow up Details #2::    negative UA. No indication for antibiotics. Follow-up by: Jacques Navy MD,  November 10, 2010 5:38 PM  Additional Follow-up for Phone Call Additional follow up Details #3:: Details for Additional Follow-up Action Taken: Left detailed vm on pt's hm # Additional Follow-up by: Lamar Sprinkles, CMA,  November 10, 2010 5:44 PM

## 2010-11-20 ENCOUNTER — Encounter: Payer: Self-pay | Admitting: Internal Medicine

## 2010-11-21 ENCOUNTER — Encounter: Payer: Self-pay | Admitting: Internal Medicine

## 2010-11-21 NOTE — Progress Notes (Signed)
Summary: LIPITOR  Phone Note From Other Clinic   Caller: [phone] - Melissa Summary of Call: Need to call and talk to Blaine Hamper to discuss lipitor.  Initial call taken by: Lamar Sprinkles, CMA,  November 13, 2010 12:22 PM  Follow-up for Phone Call        Spoke w/Melissa earlier this afternoon. They have faxed order for MD to address. Informed her that lipitor was req from the patient. Nurse will talk w/pt and clarify if she really has an allergy. Follow-up by: Lamar Sprinkles, CMA,  November 13, 2010 6:26 PM  Additional Follow-up for Phone Call Additional follow up Details #1::        waiting for response from nurse Additional Follow-up by: Jacques Navy MD,  November 14, 2010 1:53 PM    Additional Follow-up for Phone Call Additional follow up Details #2::    Nurse, Efraim Kaufmann called back. Pt states that pt has had muscle aches and pains from lipitor. Ok to do previous rx - gemfibrozil 600mg : take 1/2 tablet daily x 5 days, 1/2 tablet two times a day x 5 days, 1 tablet Am 1/2 table PM x 5 days then 1 tablet two times a day. Lipid panel in 6 weeks  272.4  and fax order to [phone].Lamar Sprinkles, CMA  November 15, 2010 4:11 PM   k Follow-up by: Jacques Navy MD,  November 15, 2010 4:55 PM  Additional Follow-up for Phone Call Additional follow up Details #3:: Details for Additional Follow-up Action Taken: order to be faxed tomorrow...Marland KitchenMarland KitchenLamar Sprinkles, CMA  November 15, 2010 6:02 PM

## 2010-11-21 NOTE — Medication Information (Signed)
Summary: Atrovastatin/[facility]   Imported By: Sherian Rein 11/13/2010 11:10:28  _____________________________________________________________________  External Attachment:    Type:   Image     Comment:   External Document

## 2010-11-25 ENCOUNTER — Encounter: Payer: Self-pay | Admitting: Internal Medicine

## 2010-11-30 NOTE — Miscellaneous (Signed)
Summary: Medication & labs order/[facility]  Medication & labs order/[facility]   Imported By: Sherian Rein 11/20/2010 08:07:20  _____________________________________________________________________  External Attachment:    Type:   Image     Comment:   External Document

## 2010-11-30 NOTE — Miscellaneous (Signed)
Summary: Orders/[facility]  Orders/[facility]   Imported By: Lester Goshen 11/23/2010 07:28:02  _____________________________________________________________________  External Attachment:    Type:   Image     Comment:   External Document

## 2010-11-30 NOTE — Miscellaneous (Signed)
Summary: Orders/[facility]  Orders/[facility]   Imported By: Lester Gilcrest 11/21/2010 07:37:32  _____________________________________________________________________  External Attachment:    Type:   Image     Comment:   External Document

## 2010-12-04 LAB — RAPID URINE DRUG SCREEN, HOSP PERFORMED
Amphetamines: NOT DETECTED
Barbiturates: NOT DETECTED
Benzodiazepines: POSITIVE — AB
Cocaine: NOT DETECTED
Opiates: NOT DETECTED

## 2010-12-04 LAB — DIFFERENTIAL
Lymphocytes Relative: 32 % (ref 12–46)
Lymphs Abs: 2.1 10*3/uL (ref 0.7–4.0)
Monocytes Relative: 6 % (ref 3–12)
Neutrophils Relative %: 59 % (ref 43–77)

## 2010-12-04 LAB — BASIC METABOLIC PANEL
CO2: 26 mEq/L (ref 19–32)
Calcium: 9.3 mg/dL (ref 8.4–10.5)
Chloride: 106 mEq/L (ref 96–112)
Creatinine, Ser: 0.78 mg/dL (ref 0.4–1.2)
GFR calc Af Amer: >60 mL/min (ref >60)
Sodium: 141 mEq/L (ref 135–145)

## 2010-12-04 LAB — CBC
Hemoglobin: 13.6 g/dL (ref 12.0–15.0)
MCV: 91.5 fL (ref 78.0–100.0)
Platelets: 279 10*3/uL (ref 150–400)
RBC: 4.58 MIL/uL (ref 3.87–5.11)
WBC: 6.6 10*3/uL (ref 4.0–10.5)

## 2010-12-05 NOTE — Miscellaneous (Signed)
Summary: Medication order/[facility] [place]  Medication order/[facility] [place]   Imported By: Sherian Rein 11/29/2010 12:11:27  _____________________________________________________________________  External Attachment:    Type:   Image     Comment:   External Document

## 2010-12-08 ENCOUNTER — Ambulatory Visit: Payer: Self-pay | Admitting: Internal Medicine

## 2010-12-20 ENCOUNTER — Telehealth: Payer: Self-pay | Admitting: *Deleted

## 2010-12-20 NOTE — Telephone Encounter (Signed)
Patient requesting advisement from MD. She is currently on lamictal and is concerned b/c her hair is falling out in clumps x 3 wks. She says this is a side effect of the med.

## 2010-12-20 NOTE — Telephone Encounter (Signed)
Please re-direct patient to Dr. Donell Beers in regard to this medication, he is the prescribing doc.

## 2010-12-21 NOTE — Telephone Encounter (Signed)
Patient informed. 

## 2010-12-30 ENCOUNTER — Emergency Department (HOSPITAL_COMMUNITY): Payer: Medicare Other

## 2010-12-30 ENCOUNTER — Inpatient Hospital Stay (HOSPITAL_COMMUNITY)
Admission: EM | Admit: 2010-12-30 | Discharge: 2011-01-01 | DRG: 305 | Disposition: A | Discharge status: Home / Self Care | Payer: Medicare Other | Attending: Internal Medicine | Admitting: Internal Medicine

## 2010-12-30 DIAGNOSIS — F29 Unspecified psychosis not due to a substance or known physiological condition: Secondary | ICD-10-CM | POA: Diagnosis present

## 2010-12-30 DIAGNOSIS — E785 Hyperlipidemia, unspecified: Secondary | ICD-10-CM | POA: Diagnosis present

## 2010-12-30 DIAGNOSIS — K589 Irritable bowel syndrome without diarrhea: Secondary | ICD-10-CM | POA: Diagnosis present

## 2010-12-30 DIAGNOSIS — F411 Generalized anxiety disorder: Secondary | ICD-10-CM | POA: Diagnosis present

## 2010-12-30 DIAGNOSIS — Z880 Allergy status to penicillin: Secondary | ICD-10-CM

## 2010-12-30 DIAGNOSIS — M810 Age-related osteoporosis without current pathological fracture: Secondary | ICD-10-CM | POA: Diagnosis present

## 2010-12-30 DIAGNOSIS — J45909 Unspecified asthma, uncomplicated: Secondary | ICD-10-CM | POA: Diagnosis present

## 2010-12-30 DIAGNOSIS — Z7982 Long term (current) use of aspirin: Secondary | ICD-10-CM

## 2010-12-30 DIAGNOSIS — I10 Essential (primary) hypertension: Principal | ICD-10-CM | POA: Diagnosis present

## 2010-12-30 DIAGNOSIS — K219 Gastro-esophageal reflux disease without esophagitis: Secondary | ICD-10-CM | POA: Diagnosis present

## 2010-12-30 DIAGNOSIS — H548 Legal blindness, as defined in USA: Secondary | ICD-10-CM | POA: Diagnosis present

## 2010-12-30 DIAGNOSIS — G609 Hereditary and idiopathic neuropathy, unspecified: Secondary | ICD-10-CM | POA: Diagnosis present

## 2010-12-30 DIAGNOSIS — Z882 Allergy status to sulfonamides status: Secondary | ICD-10-CM

## 2010-12-30 DIAGNOSIS — Z888 Allergy status to other drugs, medicaments and biological substances status: Secondary | ICD-10-CM

## 2010-12-30 DIAGNOSIS — H9319 Tinnitus, unspecified ear: Secondary | ICD-10-CM | POA: Diagnosis present

## 2010-12-30 DIAGNOSIS — H349 Unspecified retinal vascular occlusion: Secondary | ICD-10-CM | POA: Diagnosis present

## 2010-12-30 DIAGNOSIS — Z881 Allergy status to other antibiotic agents status: Secondary | ICD-10-CM

## 2010-12-30 LAB — CBC
HCT: 38.9 % (ref 36.0–46.0)
Hemoglobin: 13.4 g/dL (ref 12.0–15.0)
MCH: 30.5 pg (ref 26.0–34.0)
MCHC: 34.4 g/dL (ref 30.0–36.0)
MCV: 88.6 fL (ref 78.0–100.0)
RBC: 4.39 MIL/uL (ref 3.87–5.11)

## 2010-12-30 LAB — DIFFERENTIAL
Lymphs Abs: 2.8 10*3/uL (ref 0.7–4.0)
Monocytes Absolute: 0.8 10*3/uL (ref 0.1–1.0)
Monocytes Relative: 8 % (ref 3–12)
Neutro Abs: 5.4 10*3/uL (ref 1.7–7.7)
Neutrophils Relative %: 59 % (ref 43–77)

## 2010-12-31 ENCOUNTER — Emergency Department (HOSPITAL_COMMUNITY): Payer: Medicare Other

## 2010-12-31 ENCOUNTER — Inpatient Hospital Stay (HOSPITAL_COMMUNITY): Payer: Medicare Other

## 2010-12-31 DIAGNOSIS — F411 Generalized anxiety disorder: Secondary | ICD-10-CM

## 2010-12-31 DIAGNOSIS — E785 Hyperlipidemia, unspecified: Secondary | ICD-10-CM

## 2010-12-31 DIAGNOSIS — H9319 Tinnitus, unspecified ear: Secondary | ICD-10-CM

## 2010-12-31 DIAGNOSIS — I10 Essential (primary) hypertension: Secondary | ICD-10-CM

## 2010-12-31 LAB — POCT CARDIAC MARKERS: Myoglobin, poc: 53.3 ng/mL (ref 12–200)

## 2010-12-31 LAB — URINALYSIS, ROUTINE W REFLEX MICROSCOPIC
Glucose, UA: NEGATIVE mg/dL
Protein, ur: NEGATIVE mg/dL
Specific Gravity, Urine: 1.013 (ref 1.005–1.030)
pH: 6 (ref 5.0–8.0)

## 2010-12-31 LAB — BASIC METABOLIC PANEL
BUN: 15 mg/dL (ref 6–23)
CO2: 27 mEq/L (ref 19–32)
Chloride: 104 mEq/L (ref 96–112)
Creatinine, Ser: 0.72 mg/dL (ref 0.4–1.2)
Glucose, Bld: 109 mg/dL — ABNORMAL HIGH (ref 70–99)

## 2010-12-31 LAB — LIPID PANEL
HDL: 65 mg/dL (ref >39)
Triglycerides: 132 mg/dL (ref <150)
VLDL: 26 mg/dL (ref 0–40)

## 2010-12-31 LAB — COMPREHENSIVE METABOLIC PANEL
ALT: 13 U/L (ref 0–35)
AST: 19 U/L (ref 0–37)
CO2: 26 mEq/L (ref 19–32)
Calcium: 9.2 mg/dL (ref 8.4–10.5)
GFR calc Af Amer: >60 mL/min (ref >60)
Sodium: 139 mEq/L (ref 135–145)
Total Protein: 6.6 g/dL (ref 6.0–8.3)

## 2010-12-31 LAB — URINE MICROSCOPIC-ADD ON

## 2010-12-31 LAB — CK TOTAL AND CKMB (NOT AT ARMC)
Relative Index: INVALID (ref 0.0–2.5)
Total CK: 92 U/L (ref 7–177)

## 2010-12-31 LAB — CARDIAC PANEL(CRET KIN+CKTOT+MB+TROPI)
Total CK: 89 U/L (ref 7–177)
Troponin I: 0.04 ng/mL (ref 0.00–0.06)

## 2010-12-31 LAB — VITAMIN B12: Vitamin B-12: 404 pg/mL (ref 211–911)

## 2011-01-01 ENCOUNTER — Telehealth: Payer: Self-pay | Admitting: *Deleted

## 2011-01-01 LAB — BASIC METABOLIC PANEL
CO2: 27 mEq/L (ref 19–32)
Calcium: 9.8 mg/dL (ref 8.4–10.5)
Creatinine, Ser: 0.83 mg/dL (ref 0.4–1.2)
GFR calc Af Amer: >60 mL/min (ref >60)
Glucose, Bld: 107 mg/dL — ABNORMAL HIGH (ref 70–99)

## 2011-01-01 LAB — URINE CULTURE

## 2011-01-01 LAB — CBC
HCT: 37 % (ref 36.0–46.0)
Hemoglobin: 12 g/dL (ref 12.0–15.0)
MCH: 29.4 pg (ref 26.0–34.0)
MCHC: 32.4 g/dL (ref 30.0–36.0)

## 2011-01-01 NOTE — Telephone Encounter (Signed)
Left vm for pt on hm # - ok per [facility] form

## 2011-01-01 NOTE — Telephone Encounter (Signed)
Reviewed meds at time of discharge: she takes librium at bedtime. Did not want to double up on benzodiazepines. She has an appointment with Dr. Donell Beers tomorrow and he can address this issue.

## 2011-01-01 NOTE — Telephone Encounter (Signed)
Spoke with tiffany. The patient was asymptomatic. Her BP in hosptial was much better. Tiffany reports that once the patient calmed down her BP was 150/96. Declined to order xanax since the patient will be seeing her psychiatrist tomorrow.

## 2011-01-01 NOTE — Telephone Encounter (Signed)
Pt was d/c'd from hospital today. She states MD gave verbal ok to her for xanax every 6 hours prn. She needs written order faxed to assisted living center. OK?   ([facility] phone # [phone], need to call and get fax number for nurse)

## 2011-01-01 NOTE — Telephone Encounter (Signed)
Pt was discharged from the hosp today. Tiffany at [facility] took her bp at 3:50pm with reading of 190/112 Please Advise as to what they should do for pt.

## 2011-01-04 NOTE — H&P (Signed)
NAME:  Jean [name], LYN NO.:  [id number]  MEDICAL RECORD NO.:  [id number]           PATIENT TYPE:  LOCATION:                                 FACILITY:  PHYSICIAN:  Richarda Overlie, MD       DATE OF BIRTH:  16-Dec-1940  DATE OF ADMISSION:  12/31/2010 DATE OF DISCHARGE:                             HISTORY & PHYSICAL   PRIMARY CARE PHYSICIAN:  Rosalyn Gess. Norins, MD.  CHIEF COMPLAINT:  Generalized weakness and ringing in the head.  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  The patient states that she had a cataract surgery about 7-8 weeks ago in her left eye. Following surgery, she had no obvious complications.  However, she has noted that the ringing in her head has become worse.  She states that she has had "ringing in her head" for the last several months, but this is worse over the course of the last 1 month.  It is not relieved by antianxiety medications and it does not let her sleep at night.  The patient describes occasional dizziness and her symptoms are associated with a severe headache.  She denies any tinnitus or loss of hearing. She states that she had an ENT evaluation done in the past and the workup was essentially negative.  She denies any chest pain, any shortness of breath, but does complain of difficulty with ambulation over the course of the last 24 hours and feels that both her legs are significantly weaker than they were 24 hours ago.  The patient denies any recent fever, chills, and rigors.  She denies any recent weight loss or weight gain.  She denies any history of prior previous stroke.  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  PAST SURGICAL HISTORY:  Appendectomy, tonsillectomy, hysterectomy, carotid endarterectomy.  FAMILY HISTORY:  Father deceased at 31 from coronary artery disease, MI, and AAA.  Mother disease at the age of 24 from Alzheimer's.  Sister had ovarian cancer.  Brother had lung cancer and AAA  SOCIAL HISTORY:  The patient has two sons, 71 and 24.  She has two granddaughters.  She lives in friend's home in a studio apartment.  She has never smoked.  She did have second hand exposure to smoke during her childhood and at work.  REVIEW OF SYSTEMS:  Complete review of systems was done, as documented in HPI.  HOME MEDICATIONS:  Mylanta, chlordiazepoxide, acetaminophen, phenothiazine, lamotrigine, Colace, calcium, magnesium, vitamin D, vitamin D3, famotidine, coenzyme-Q, Cerovite, aspirin.  ALLERGIES:  PENICILLIN, SULFA, DOXYCYCLINE, ZITHROMAX, MAGIC MOUTHWASH, and SHELLFISH.  She is also allergic to LIPITOR, BIAXIN, FOSAMAX, STATINS, and PERFUMES.  PHYSICAL EXAMINATION:  VITAL SIGNS:  Blood pressure 172/86, pulse of 82,  respirations 18, temperature 98.6. GENERAL:  Currently anxious, comfortable, in no acute cardiopulmonary distress. HEENT:  Grossly nonfocal.  Sclerae anicteric.  Oral mucosa is dry. Grossly decreased visual acuity in both visual field. NECK:  Supple.  No JVD. LUNGS:  Clear to auscultation bilaterally.  No wheezes.  No crackles or rhonchi. CARDIOVASCULAR:  Regular rate and rhythm.  No murmurs, rubs, or gallops. ABDOMEN:  Soft, nontender, nondistended. EXTREMITIES:  Without cyanosis, clubbing, or edema. NEUROLOGIC:  Cranial nerves II-XII grossly intact.  She has mildly decreased motor strength 4/5 in bilateral lower extremities and intact motor strength 5/5 in both upper extremities. PSYCHIATRIC:   The patient appears to be quite anxious.  ASSESSMENT AND PLAN: 1. Possible transient ischemic attack.  The patient did have an     elevated blood pressure and she currently takes furosemide for her     peripheral edema, which is probably the reason for her low     potassium upon admission.  The patient's blood pressure is still     not controlled and I feel that lisinopril would be appropriate in     the setting of her hypokalemia and hypertension.  She does not     report any allergy to ACE inhibitors.  The patient's headache and     ringing, so called ringing in her head, could be related to her     hypertension.  We will do a full stroke workup including an MRI of     the brain without contrast and an MRA of the brain without     contrast.  The patient also reports occipital headache and neck     pain and therefore we will obtain an MRI of the neck as well. 2. Dyslipidemia.  The patient apparently was on Crestor at some point,     but I do not see that on her medication list from the nursing home     today.  Her outpatient medications need to be reconciled.  We will     repeat a lipid panel. 3. Anxiety.  The patient follows Dr. Donell Beers in the outpatient     setting.  She will continue with her outpatient medications namely,     amitriptyline, chlordiazepoxide, and alprazolam.  DISPOSITION:  She is a full code.     Richarda Overlie, MD     NA/MEDQ  D:  12/31/2010  T:  12/31/2010  Job:  [id number]  Electronically Signed by Richarda Overlie MD on 01/04/2011 12:37:56 PM

## 2011-01-09 ENCOUNTER — Encounter: Payer: Self-pay | Admitting: Internal Medicine

## 2011-01-09 ENCOUNTER — Ambulatory Visit (INDEPENDENT_AMBULATORY_CARE_PROVIDER_SITE_OTHER): Payer: Medicare Other | Admitting: Internal Medicine

## 2011-01-09 VITALS — BP 126/90 | HR 88 | Temp 97.5°F | Wt 136.0 lb

## 2011-01-09 DIAGNOSIS — I10 Essential (primary) hypertension: Secondary | ICD-10-CM

## 2011-01-09 DIAGNOSIS — F329 Major depressive disorder, single episode, unspecified: Secondary | ICD-10-CM

## 2011-01-09 DIAGNOSIS — F411 Generalized anxiety disorder: Secondary | ICD-10-CM

## 2011-01-09 DIAGNOSIS — Z23 Encounter for immunization: Secondary | ICD-10-CM

## 2011-01-09 MED ORDER — ZOSTER VACCINE LIVE 19400 UNT/0.65ML ~~LOC~~ SOLR: 0.6500 mL | Freq: Once | SUBCUTANEOUS | Status: DC

## 2011-01-09 NOTE — Progress Notes (Signed)
  Subjective:    Patient ID: Jean [name], female    DOB: 11/27/1940, 70 y.o.   MRN: [id number]  HPI Jean [name] was recently hospitalized for an episode of very high blood pressure and for perceived mental status changes. There was no evidence of stroke. Her blood pressure was well controlled in the hospital. She feels that she gets better control of blood pressure using alprazolam. The is on thiothixene, ativan and librium. She has been told that Dr. Donell Beers is the doctor who should be in charge of psychotropic medications.   She wants the shngles vaccine.     Review of Systems Review of Systems  Constitutional:  Negative for fever, chills, activity change and unexpected weight change.  HENT:  Negative for hearing loss, ear pain, congestion, neck stiffness and postnasal drip.   Eyes: Negative for pain, discharge and visual disturbance.  Respiratory: Negative for chest tightness and wheezing.   Cardiovascular: Negative for chest pain and palpitations.       [No decreased exercise tolerance Gastrointestinal: [No change in bowel habit. No bloating or gas. No reflux or indigestion Genitourinary: Negative for urgency, frequency, flank pain and difficulty urinating.  Musculoskeletal: Negative for myalgias, back pain, arthralgias and gait problem.  Neurological: Negative for dizziness, tremors, weakness and headaches.  Hematological: Negative for adenopathy.  Psychiatric/Behavioral: Negative for behavioral problems and dysphoric mood.       Objective:   Physical Exam  Constitutional: She is oriented to person, place, and time. She appears well-developed and well-nourished. No distress.  HENT:  Head: Normocephalic and atraumatic.  Right Ear: External ear normal.  Left Ear: External ear normal.  Eyes: Conjunctivae and EOM are normal. Pupils are equal, round, and reactive to light.  Neck: Neck supple.  Cardiovascular: Normal rate, regular rhythm and normal heart sounds.     Pulmonary/Chest: Effort normal and breath sounds normal. No respiratory distress. She has no rales. She exhibits no tenderness.  Abdominal: Soft. Bowel sounds are normal.  Musculoskeletal: Normal range of motion. She exhibits no edema and no tenderness.  Neurological: She is alert and oriented to person, place, and time. She has normal reflexes. No cranial nerve deficit.  Skin: Skin is warm and dry.  Psychiatric: She has a normal mood and affect.       A little anxious, no frank hallucinosis.           Assessment & Plan:  1. Blood pressure - good control at this time. Will continue present medication.s  2. Anxiety and hallucinosis - will continue present medications and not add routine alprazolam since she is on librium and ativan.  Will defer to Dr. Donell Beers.

## 2011-01-10 ENCOUNTER — Encounter: Payer: Self-pay | Admitting: Internal Medicine

## 2011-01-14 NOTE — Discharge Summary (Signed)
NAME:  Jean [name], Jean [name] NO.:  [id number]  MEDICAL RECORD NO.:  [id number]           PATIENT TYPE:  I  LOCATION:  3733                         FACILITY:  [facility]  PHYSICIAN:  Rosalyn Gess. Celica Kotowski, MD  DATE OF BIRTH:  June 25, 1941  DATE OF ADMISSION:  12/30/2010 DATE OF DISCHARGE:  01/01/2011                              DISCHARGE SUMMARY   ADMITTING DIAGNOSES: 1. Uncontrolled hypertension. 2. Tinnitus. 3. Dyslipidemia. 4. Anxiety with psychosis.  DISCHARGE DIAGNOSES: 1. Uncontrolled hypertension. 2. Tinnitus. 3. Dyslipidemia. 4. Anxiety with psychosis.  CONSULTANTS:  None.  PROCEDURES: 1. CT of the head performed on December 31, 2010, with unremarkable     noncontrast CT of the head. 2. Chest two-view which showed a stable exam with no acute     cardiopulmonary process.  HISTORY OF PRESENT ILLNESS:  The patient is a 70 year old woman who has a history of uncontrolled hypertension with retinal artery occlusion who is legally blind with some peripheral vision in the left eye.  The patient was in the [facility] where she was noted to have a very high blood pressure.  The patient complained of generalized weakness as well as loud ringing in her head.  This latter phenomena is chronic, but at times gets to the point where she feels that she is unable to walk or ambulate.  The patient reports that her ringing in her head has gotten worse over the last month.  It interferes with sleep.  She also describes occasional dizziness.  She has reported severe headache.  The patient has had an ENT evaluation in the past with a negative workup.  The patient denied any chest pain or any shortness of breath.  She did complain of ambulation difficulties due to the ringing in her ears and perceived weakness in her legs.  She had no fevers, chills, rigors, or any other significant symptoms.  Of note, she had no focal neurologic complaints.  Past medical  history, family history, social history are well documented in the admission note.  ADMISSION EXAMINATION:  VITAL SIGNS:  Significant for blood pressure of 172/86. HEENT:  Unremarkable except for decreased visual acuity. CARDIOVASCULAR:  Regular rate and rhythm without murmurs, rubs, or gallops. NEUROLOGIC:  Read out as showing cranial nerves II through XII grossly intact, mildly decreased motor strength 4/5 that was bilateral in the lower extremities with intact motor strength 5/5 in the upper extremities.  HOSPITAL COURSE: 1. Neuro:  The patient had no evidence of a CNS event.  She had no     focal neurologic findings.  No loss of consciousness.  The ringing     in her head is chronic.  The patient also has a known well     established peripheral neuropathy.  Due to lack of focal findings     and the lack of evidence of a CNS event, the stroke workup     including MRI of brain, MRA of brain, carotid Dopplers, and 2-D     echo was cancelled.  The patient remained stable with no focal     neurologic changes.  She was seen by Speech Pathology who found the     patient to have no deficits.  The patient was seen by Physical     Therapy who felt the patient was independent in ambulation     including stair negotiation and had no further PT needs.  The     patient's telemetry revealed a normal sinus rhythm with occasional     PVCs.  With the patient having no neurologic complaints or     problems, with a negative examination and normal evaluation with     normal laboratory including a normal B12 level, she is thought to     be stable in this regard and ready for discharge. 2. Hypertension:  The patient has a longstanding refractory     hypertension and has recently been under reasonably good control.     She evidently had an acceleration of her blood pressure at the     [facility], but during her hospital stay her     blood pressures have been within reasonable range  for her.  She had     no significant acute hypertension.  Plan is for her to continue on     her present medications at the time of discharge. 3. ID:  The patient had an ambiguous UA, normal white count, no fever.     It was felt she had no evidence to support urinary tract infection.     No antibiotics were indicated.  No Foley catheter was placed. 4. 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  She is followed by Dr. [name] on a regular     basis who manages her psychotropic medications that include     chlordiazepoxide, phenothiazine, and lamotrigine.  The patient will     continue on these medications.  She reports that she did do better     with Xanax in her hospital stay, but she is on chlordiazepoxide at     home and will not double up on her benzodiazepines.  She will     continue on her present medications.  She reports she does have an     appointment to see Dr. Donell Beers on Tuesday, January 02, 2011, and she     will keep this appointment.  With the patient is being neurologically stable with her blood pressure being well controlled, she is now ready for discharge to home.  DISCHARGE EXAMINATION:  VITAL SIGNS:  Temperature 97.9, blood pressure 169/79, pulse 81, respirations 18, and O2 sats 91% on room air. GENERAL APPEARANCE:  This is a pleasant older woman in no acute distress. HEENT:  All signs of trauma or abnormality. NECK:  Supple. CHEST:  The patient is moving air well with no rales, wheezes, or rhonchi. CARDIOVASCULAR:  2+ radial pulse.  No JVD.  No carotid bruit.  She had a quiet precordium with a regular rate and rhythm with occasional PVCs. BREASTS:  Deferred. ABDOMEN:  Soft.  No guarding or rebound.  No organo/splenomegaly.  No tenderness. GENITALIA:  Deferred. EXTREMITIES:  Without clubbing, cyanosis, or edema.  No deformities were noted. NEUROLOGIC:  The patient is awake and alert.  She is oriented to  person, place, time, and context.  Her speech is clear.  Cranial nerves II-XII revealed the patient had significant visual loss.  Extraocular muscles were intact.  Pupils were equal, round, and reactive.  Motor strength was normal.  The patient was  able to move all of her extremities to command.  Cerebellar function, no tremor was noted.  FINAL LABORATORY DATA:  Metabolic panel from the day of discharge with sodium 137, potassium 3.5, chloride 103, CO2 of 27, BUN of 18, creatinine 0.8, and glucose was 107.  CBC from the day of discharge with a white count of 7000, hemoglobin of 12 g, and platelet count 260,000. Cardiac panels were negative x3.  B12 was normal at 404.  TSH was normal at 2.6.  A1c was normal at 6%.  Lipid profile with a cholesterol 258, triglycerides 132, HDL 65, and LDL 167.  Urinalysis showed few bacteria and 3-6 wbc's per high-powered field.  DISCHARGE MEDICATIONS:  The patient will continue on her home medications and are outlined in the medication discharge reconciliation manager.  FOLLOWUP:  The patient will be seen by Dr. Donell Beers on the 10th.  The patient will be seen in the office in 7-10 days for routine followup.  The patient's condition at time of discharge dictation is stable and improved.  Rosalyn Gess Muadh Creasy, MD     MEN/MEDQ  D:  01/01/2011  T:  01/01/2011  Job:  [id number]  cc:   [name], M.D.  Electronically Signed by Illene Regulus MD on 01/14/2011 05:45:41 PM

## 2011-02-06 NOTE — Assessment & Plan Note (Signed)
[facility]                         GASTROENTEROLOGY OFFICE NOTE   Jean, [name]                         MRN:          [id number]  DATE:04/17/2007                            DOB:          04-18-1941    CHIEF COMPLAINT:  Constipation, abdominal pain.   PROBLEMS:  1. Constipation predominant irritable bowel syndrome.  2. Severe diverticulosis, colonoscopy March 21, 2007.  3. Internal/external hemorrhoids.  4. Asthma.  5. Anxiety and depression.  6. Gastroesophageal reflux disease.  7. Allergic rhinitis.  8. Tonsillectomy.  9. Prior hysterectomy.  10.Prior appendectomy.  11.Prior carotid endarterectomy.  12.History of peptic ulcer disease.  13.Osteoporosis.  14.Question component of bacterial overgrowth syndrome in the past.  15.Peripheral neuropathy.   MEDICATIONS:  Listed and reviewed in the chart.   DRUG ALLERGIES:  PENICILLIN, SULFA, Z-PAK, DOXYCYCLINE, MAGIC MOUTHWASH.   HISTORY:  She went to [facility] to visit her son and started to have  constipation problems there despite using her MiraLax daily. She stayed  a little longer because she felt poorly and finally came back here. Last  bowel movement was about 1-1/2 to 2 weeks ago after Dulcolax. She took  three doses of MiraLax yesterday as advised by our triage nurse, but has  not moved her bowels. She feels bloated and gassy. There is no fever.  She has a dull steady pain in the left lower quadrant. She has been  drinking liquids and eating soft foods like pudding. These are similar  to symptoms previously. She is not complaining of fevers or chills. She  has not vomited. There may have been some mild nausea.   PHYSICAL EXAMINATION:  Obese, pleasant white woman. Weight 146 pounds.  Pulse 76, blood pressure 134/80.  NECK: Is supple.  CHEST: Is clear anteriorly.  HEART: S1, S2. No murmurs, rubs or gallops.  ABDOMEN: Is obese, soft. Minimally tender to deep palpation in the left  lower quadrant without organomegaly or mass. There is no obvious hernia.  RECTAL: Examination in the presence of female nursing staff shows  minimal stool. There is no impaction. She has some protruding  hemorrhoids.  LOWER EXTREMITIES: She is free of edema.  NEURO/PSYCH: She is alert and oriented x3.   ASSESSMENT:  I think this is an exacerbation of her constipation  predominant irritable bowel syndrome as well as perhaps diverticular  disease. I do not think she has diverticulitis as she is not that  tender. There is no fever.   PLAN:  1. She will drink two liters of TriLyte this afternoon 15-30 minutes      apart for each glass. If that is not helpful, she is to repeat it      tomorrow.  2. After that, assuming things get opened up, she is to take MiraLax      twice a day.  3. Change from [employer] to [employer] daily.  4. If this does not help, she is to call me back.     Iva Boop, MD,FACG  Electronically Signed    CEG/MedQ  DD: 04/17/2007  DT: 04/17/2007  Job #: [id number]

## 2011-02-06 NOTE — Assessment & Plan Note (Signed)
[facility]                             PULMONARY OFFICE NOTE   Jean [name], Jean [name]                         MRN:          [id number]  DATE:08/06/2007                            DOB:          09/14/1941    PROBLEM LIST:  1. Asthma.  2. Allergic rhinitis.  3. Eustachian dysfunction.  4. Esophageal reflux.   PRIMARY CARE PHYSICIAN:  Rosalyn Gess. Norins, M.D.   HISTORY:  One-year follow-up.  She says sinus congestion has done well.  She rarely uses her Proair rescue inhaler and does not want Advair.  She  had Pneumococcal vaccine in 2007.  She feels a little tight today.  At  night sometimes lying in bed she has a hot burning flushing feeling of  her face and neck.  She puts ice on her neck.  She feels sometimes a  little short of breath talking.  She gets little exercise and has gained  some weight.  Not much sputum.  No chest pain or palpitations.  No  fever.   MEDICATIONS:  1. Amitriptyline 25 mg.  2. Clotrimazole 10 mg.  3. Zantac.  4. Fluticasone nasal spray.  5. Albuterol rescue inhaler used rarely.   ALLERGIES:  PENICILLIN, SULFA, ZITHROMAX, DOXYCYCLINE, MAGIC MOUTHWASH.   OBJECTIVE:  VITAL SIGNS:  Weight 145 pounds, blood pressure 118/70,  pulse 73, room air saturation 100%.  GENERAL:  She is talkative and seems in no evident distress.  HEART:  Sounds are regular without murmur.  NECK:  There is no neck vein distention.  No evident flushing or rash  now.  Nasal airway is clear.  Voice quality normal.  Thyroid not  enlarged.  Conjunctivae clear.  LUNGS:  Clear.   IMPRESSION:  1. Nonspecific episodes of facial flushing that she seems to associate      with lying in bed. This may be a positional effect.  2. Asthma and rhinitis, otherwise stable.   PLAN:  PFT.  Flu vaccine discussed and given.  Chest x-ray.  Schedule  return in four months, earlier p.r.n.     Clinton D. Maple Hudson, MD, Tonny Bollman, FACP  Electronically Signed    CDY/MedQ  DD:  08/10/2007  DT: 08/11/2007  Job #: [id number]

## 2011-02-06 NOTE — Assessment & Plan Note (Signed)
[facility]                               LIPID CLINIC NOTE   Jean [name], Jean [name]                         MRN:          [id number]  DATE:05/24/2008                            DOB:          Jul 07, 1941    Jean [name] is seen in the Lipid Clinic for further evaluation and  medication titration associated with hyperlipidemia in the setting of  multiple medication intolerances.  The patient states that she sees a  number of physicians.  She sees Dr. Maple Hudson, Dr. Debby Bud, Dr. Milus Glazier,  Dr. Terri Piedra, Dr. Madilyn Fireman, and Dr. Charyl Dancer.  She has had multiple  intolerances to many medications.  She has had difficulty in tolerating  lipid-lowering therapies in the past.   FAMILY HISTORY:  She, on review of family history, describes positive  family history, and she further describes that she has concern that her  sons, one aged 34 and one aged 31, want her not to take any medications  that this is a sign of weakness.   PAST MEDICAL HISTORY:  Pertinent for;  1. Hypercholesterolemia.  2. Allergic rhinitis.  3. Esophageal reflux.  4. Asthma.   CURRENT MEDICATIONS INCLUDE:  1. Amitriptyline 50 mg daily at bedtime.  2. Zantac 150 mg daily at bedtime as needed.  3. Chlordiazepoxide 25 mg at bedtime.  4. Chlordiazepoxide 10 mg daily in the morning as needed.  5. MiraLax twice daily as needed.  6. Align, discontinued.  7. Kyolic liquid one-quarter teaspoon daily.  8. Vitamin D 50,000 International Units a week for 8 weeks, on      Mondays.  9. Ambien 5 mg p.o. nightly p.r.n.  10.Alprazolam 0.125 mg every day as needed.  11.Flonase 1-2 sprays every day as needed.   ALLERGIES INCLUDE:  PENICILLIN, SULFA, Z-PAK, DOXY, and MAGIC MOUTHWASH.   REVIEW OF SYSTEMS:  As stated in the HPI and otherwise negative.   PHYSICAL EXAMINATION:  VITAL SIGNS:  Weight today is 148.25 pounds,  blood pressure is 156/82 in the left arm and 151/86 in the right arm,  heart rate is 80, and  respirations are 17.   LABORATORY VALUES:  Labs on Jan 23, 2008 revealed an LDL directly  measured of 660.6.   ASSESSMENT:  We spent greater than 45 minutes with the patient today  talking about how HMG-CoA reductase inhibitors work, how muscle aches  that are associated with these are oftentimes not associated with  meaningful muscle harm.  The patient has not had problems tolerating  this in the past.  We will ask the patient begin Coenzyme Q10 if she  will consider it and potentially try Zetia at this time if she is  unwilling to try something again like Crestor.  The samples have been  given to the patient to facilitate compliance, and I will call the  patient in 1 week to see how she is doing, and we will proceed  accordingly.      Jean [name], PharmD, BCPS, CPP  Electronically Signed      Jean Rotunda, MD, [facility]  Electronically Signed   MP/MedQ  DD: 06/04/2008  DT: 06/05/2008  Job #: [id number]   cc:   Jean Gess. Norins, MD

## 2011-02-09 NOTE — H&P (Signed)
[place]. [facility]  Patient:    Jean [name], Jean [name]                        MRN: [id number] Adm. Date:  10/18/00 Dictator:   Marlowe Kays, P.A. CC:         Dr. Langley Adie F. Alwyn Ren, M.D. [facility]   History and Physical  DATE OF BIRTH:  [birth year]  CHIEF COMPLAINT:  Carotid artery disease.  HISTORY OF PRESENT ILLNESS:  Ms. Jean [name] is a pleasant, 70 year old, white female referred by Titus Dubin. Alwyn Ren, M.D. for evaluation of carotid artery disease. During a routine physical exam at Dr. Titus Dubin. [place] office, carotid bruits were heard. The follow-up Doppler in July 2001 revealed carotid bulb disease of 70 to 80%. This was followed up with a repeat Doppler in January 2002 which revealed significant progression of the disease with continued bulb stenosis now 80 to 99%. Upon these findings, Dr. Madilyn Fireman recommended to proceed with left CEA to decrease the risk of stroke. She denies any headache, nausea, vomiting, or vertigo. She does have some "weak spells" accompanied by some dizziness which resolved quickly. She denies any history of fall, seizures, numbness, tingling, muscle weakness, speech impairment, dysphagia, vision changes, syncope or presyncope. No memory loss or confusion. 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 No recent vision changes.  PAST MEDICAL HISTORY: 1. Carotid artery disease. 2. Allergic rhinitis. 3. 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 History of asthma as a child. 5. History of hiatal hernia - GERD. 6. Hypertension. 7. Hemorrhoids. 8. Status post ______ 2001.  PAST SURGICAL HISTORY: 1. Status post tonsillectomy in the 1960s. 2. Status post hysterectomy in 1980. 3. Status post heart catheterization, 2000.  CURRENT MEDICATIONS: 1. Limbitrol DS 1 p.o.  q.h.s. The patient stopped on October 16, 2000. 2. Premarin 0.3 mg one p.o. q.d. 3. Caltrate 600 mg p.o. q.d. 4. Baby aspirin q.d. 5. Robinul 1 mg p.o. q.d. 6. Flonase nasal spray 2 p.o. in each nostril q.d. 7. Prilosec 20 mg p.o. q.d.  ALLERGIES:  The patient has multiple food, lotions, and face cream allergies for which she takes allergy shots. In drugs, she is allergic to PENICILLIN, ______ .  REVIEW OF SYSTEMS:  See HPI and past medical history for significant positives.  FAMILY HISTORY:  Mother died at 5 of MI. Father died at 26 of MI. He also had a history of triple A. One sister alive with a history of ovarian cancer, and one brother alive with a history of CVA.  SOCIAL HISTORY:  The patient is divorced. She has two children who live out of town. She is retired. She denies any tobacco or alcohol intake.  PHYSICAL EXAMINATION:  GENERAL:  This is a well-developed, well-nourished, 70 year old, white female in no acute distress. Alert and oriented x 3. Highly anxious.  VITAL SIGNS:  Blood pressure 130/80, pulse 90, respirations 20.  HEENT:  Head:  Normocephalic, atraumatic. Right PERRLA and right EOMI. In the left side, she has central vision loss with normal peripheral vision. Funduscopic exam:  Left pallor is visible.  NECK:  Supple. No JVD. Left bruit is heard. No lymphadenopathy.  CHEST:  Symmetrical on inspirations.  LUNGS:  Clear to auscultation  bilaterally.  CARDIOVASCULAR:  Regular rate and rhythm without murmurs, rubs, or gallops. There is a split S2.  ABDOMEN:  Soft, nontender. Bowel sounds x 4. No masses or bruits.  GENITOURINARY:  Deferred.  RECTAL:  Deferred.  EXTREMITIES:  No clubbing, cyanosis, or edema. No ulcerations.  SKIN:  Temperature warm.  PERIPHERAL PULSES:  Carotid 2+ bilaterally with an audible bruit on the left. Femoral, popliteal, dorsalis pedis, and posterior tibialis 2+ bilaterally.  NEUROLOGICAL:  Nonfocal. Normal gait. DTRs 2+  bilaterally. Muscle strength 5/5.  ASSESSMENT AND PLAN:  Left carotid artery disease for left carotid endarterectomy at [facility] on October 18, 2000. Dr. Madilyn Fireman has seen and evaluated this patient prior to the admission and has explained the risks and benefits involved in the procedure. The patient has agreed to continue. DD:  10/16/00 TD:  10/16/00 Job: [id number]

## 2011-02-09 NOTE — Discharge Summary (Signed)
[place]. [facility]  Patient:    Jean [name], Jean [name]                         MRN: [id number] Adm. Date:  [id number] Attending:  Melvenia Needles Dictator:   Maxwell Marion, RNFA CC:         Titus Dubin. Alwyn Ren, M.D. [facility]   Discharge Summary  DATE OF BIRTH:  [birth year]  ADMISSION DIAGNOSIS:  Left carotid artery stenosis.  PAST MEDICAL HISTORY: 1. Mild stroke at age 70, probable embolic event to her eye. 2. Hypertension. 3. Allergic rhinitis. 4. 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 5. History of hiatal hernia/GERD. 6. Status post cardiac catheterization August 2000.  ALLERGIES:  Ms. Farha is allergic to PENICILLIN and LIPITOR.  Her skin is also sensitive to MANY LOTIONS AND CREAMS.  DISCHARGE DIAGNOSIS:  Left carotid artery stenosis, status post left carotid endarterectomy.  HISTORY:   Ms. Delpino is a 70 year old white female who was evaluated in Dr. Madilyn Fireman office on January 17 after referral from her primary care Jacob Cicero, Dr. Marga Melnick.  On routine examination, Dr. Alwyn Ren heard bilateral carotid artery bruits.  A carotid Doppler study from July 2001 revealed left carotid artery stenosis estimated to be 70 to 80%. A repeat study performed January 2002 revealed significant progression of the carotid disease with the stenosis now approximately 80 to 99%.  Dr. Madilyn Fireman recommended left carotid endarterectomy as preferred treatment choice for this woman.  HOSPITAL COURSE:  On October 18, 2000, Ms. Casco was admitted to [facility] by Dr. Denman George and underwent uncomplicated left carotid endarterectomy with Dacron patch angioplasty.  At the conclusion of the procedure, she was transferred to the PACU in stable condition.  She was noted to be neurologically intact following her surgery.  Ms. Chandran postoperative course has been uneventful, and it is anticipated she will be ready for discharge home tomorrow, October 20, 2000.  CONDITION UPON DISCHARGE:  Ms. Ow condition is improved.  DISCHARGE INSTRUCTIONS:  She has been instructed regarding diet, activity, medications, wound care, and followup appointments.  DISCHARGE MEDICATIONS: 1. Tylox 1 to 2 p.o. q.4-6h. p.r.n. pain.  She has been instructed to resume her home medications 2. Limbitrol DS 1 q.h.s. 3. Centrum Silver multivitamin 1 p.o. q.d. 4. Robinul 1 mg p.o. q.d. 5. Premarin 0.3 mg p.o. q.d. 6. Caltrate 600 mg 1 q.a.m., 1 q.p.m. 7. Flonase 2 puffs q.d. 8. Aspirin 81 mg 1 p.o. q.d. 9. Fosamax weekly.  FOLLOWUP:  Ms. Glassburn has a followup appointment to see Dr. Madilyn Fireman at the [facility] office on November 04, 2000, at 12:30. DD:  10/19/00 TD:  10/19/00 Job: [id number]

## 2011-02-09 NOTE — Discharge Summary (Signed)
NAME:  Jean [name], Jean [name] NO.:  [id number]   MEDICAL RECORD NO.:  [id number]          PATIENT TYPE:  BIPS   LOCATION:                                FACILITY:  [facility]   PHYSICIAN:  Geoffery Lyons, M.D.           DATE OF BIRTH:   DATE OF ADMISSION:  10/25/2004  DATE OF DISCHARGE:  11/03/2004                                 DISCHARGE SUMMARY   CHIEF COMPLAINT AND PRESENT ILLNESS:  This was the first admission to [facility] for this 70 year old divorced white female presently  admitted.  History of psychosis having paranoid ideas, believing that things  were happening at her extended stay where she has been living while looking  for a home.  She had been hearing noises like boxes have been dropping on  her head; that people are following her with a car. Claimed that the only  time that she felt safe was with her family.  She used to feel safe when she  was in a car, but she felt like the accelerator was just going without her  even  touching it.  Wondering if people in the hotel were injecting  something into her that was causing her to hurt everywhere.   PAST PSYCHIATRIC HISTORY:  Present admission at [facility], sees Dr.  Lafayette Dragon.   ALCOHOL AND DRUG HISTORY:  Denies the use of abuse of any substances.   PAST MEDICAL HISTORY:  Positive for some nerve damage in her face, status  post carotid endarterectomy, hypercholesterolemia, reflux, and asthma.   MEDICATIONS:  Amitriptyline 15 at night, Librium 15, prednisolone for sinus  infection, being tapered.   PHYSICAL EXAMINATION:  Performed and failed show any acute findings.   LABORATORY WORKUP:  Hemoglobin 15.1. Drug screen positive for  benzodiazepines.  TSH 1.793.  CT scan was negative for acute findings.   MENTAL STATUS EXAM:  The patient is an alert female, cooperative, fair eye  contact.  Speech was rambling and tangential.  Mood was anxious, and  appeared to be guarded.  Thought processes were  positive for delusional  ideas, paranoia, no hallucination, no suicidal ideation.  Cognition well  preserved.  ADMISSION DIAGNOSIS   AXIS I:  Psychotic disorder, not otherwise specified.   AXIS II:  No diagnosis.   AXIS III:  1.  Asthma.  2.  Gastroesophageal reflux disease.  3.  Hypercholesterolemia.  4.  Status post carotid endarterectomy.   AXIS IV:  Moderate.   AXIS V:  1.  Global assessment of functioning upon admission:  30.  2.  Highest global assessment of functioning in the last year:  29.   COURSE IN THE HOSPITAL:  She was admitted and started in individual and  group psychotherapy.  She was maintained on amitriptyline 50 mg at night,  hydrocodone 5 ___________ q.6h. for pain, Librium 25 per day.  She was on a  prednisone taper 4 mg Librium was discontinued.  She was given some Haldol  5.5 in the morning and 2 mg at night, Cogentin 0.5 twice  a day as needed.  She was given clonidine 0.25 twice a day, and 0.5 at night and Geodon was  discontinued.  She endorsed that she had a sensation of being torched in  several parts of her body. She feels that there were people behind this.  Someone was trying to control her.  Felt that people were following her.  She has been staying at extended hotels for 6 months.  Felt that this  contributed to what was going on; that people were spying.  Felt unsafe,  said she was staying with a friend.  She was supposed to be going to an  apartment at [facility], but she changed her mind, unable to make a  decision.  She did say that she did have an endarterectomy and her mood  worsened after that.  When on amitriptyline, stating that she was not used  to using generic.  Her speech was both fast and pressured.  Mood was  elevated and expansive.  She was compliant with medications.  We worked with  Haldol to adjust the dose.  He had some tiredness and sluggishness in the  morning.  She was also endorsing chronic tinnitus.  He  continued to be  somatically focused, but she was sleeping better.  She was trying to be  without the Elavil.  We held the Elavil, decreased the Elavil to 25.  She  could not sleep, and so went ahead and went back to 50.  By Friday the 20th  she was better in full contact with reality.  She, herself, knew that she  was better.  Objectively, she was brighter, speech was normal, rate and  production.  No suicidal ideas, no hallucinations.  She is going to stay  with a friend and make her way to ____________ care.   DISCHARGE DIAGNOSES   AXIS I:  Psychotic disorder, not otherwise specified.  Anxiety disorder, not otherwise specified.   AXIS II:  No diagnosis.   AXIS III:  1.  Asthma.  2.  Gastroesophageal reflux disease.  3.  Hypercholesterolemia.   AXIS IV:  Moderate.   AXIS V:  Global assessment of functioning 50.   DISCHARGE MEDICATIONS:  1.  Haldol 0.5 twice a day and 2.5 at night.  2.  Pepcid 20 mg twice a day.  3.  Librium 10 in the morning and at night.  4.  Nasonex 50 mg once spray twice a day.  5.  Elavil 50 mg at night.  6.  Vicodin 5/500 one q.6h. as necessary for pain.  7.  Ambien 10 at bedtime for sleep.   FOLLOWUP:  With _____________.      IL/MEDQ  D:  12/05/2004  T:  12/06/2004  Job:  [id number]

## 2011-02-09 NOTE — Assessment & Plan Note (Signed)
[facility]                         GASTROENTEROLOGY OFFICE NOTE   BRENIYAH, ROMM                         MRN:          [id number]  DATE:01/15/2007                            DOB:          Jul 17, 1941    PROBLEM:  Chronic constipation and bloating.   HISTORY:  Kathie Rhodes comes in for followup relating that she has had a  worsening of her constipation and abdominal bloating over the past month  or so. She feels this may in part be related to a change in her diet.  She had multiple teeth extracted recently and has been on a very bland  soft diet. She says she has been drinking lots of fluids, but despite  that has had more problems with the constipation. She has had chronic  constipation always as far back as she can remember. She has been  treated with various regimens over the years. Currently, is supposed to  be taking MiraLax, but admits that she does not take it on a regular  basis though it is helpful when she takes it. She was treated in  December with a course of Xifaxan and Flora-Q for potential bacterial  overgrowth and definitely feels that that helped her symptoms quite a  bit. She actually one other course of Flora-Q and Xifaxan since that  time. She had called here last week and was started back on Xifaxan with  samples and comes in today for followup. She says she started back on  Flora-Q and asks if she can take it on a daily basis because she feels  that it is helpful. She would also like another course of Xifaxan. She  has noticed a change in her stool caliber, which is concerning to her  and says she usually has very small narrow stools and pellet like  stools, though after a regimen of Xifaxan and Flora-Q will get back to  more formed bowel movement.   CURRENT MEDICATIONS:  1. Amitriptyline 50 q nightly.  2. Chlordiapoxide 25 mg daily.  3. Zantac 150 daily p.r.n.  4. Flora-Q daily.   ALLERGIES:  1. PENICILLIN.  2. SULFA.  3.  Z-PAK.  4. DOXYCYCLINE.   PHYSICAL EXAMINATION:  Well-developed, white female in no acute  distress. Weight is 145, blood pressure 122/86, pulse 84.  CARDIOVASCULAR: Regular rate and rhythm with S1 and S2. There is no  murmur, rub or gallop.  PULMONARY: Clear to A&P.  ABDOMEN: Soft and nontender. Bowel sounds are active. She had some very  minimal tenderness in the right lower quadrant. No guarding or rebound.  No mass or hepatosplenomegaly.  RECTAL: Was not done today.   IMPRESSION:  58A 70 year old white female with chronic constipation and  bloating with component of irritable bowel syndrome. Has responded to  treatment for bacterial overgrowth previously.   PLAN:  1. Schedule colonoscopy for surveillance. Last colonoscopy was done in      October 2001, showed left colon diverticulosis and internal and      external hemorrhoids.  2. Re-encouraged her to use the MiraLax on a regular basis which she  says she will do and if once daily dosing is not effective, she may      periodically take b.i.d. dosing.  3. Continue Flora-Q one p.o. daily.  4. Re-trial of Xifaxan 200 mg b.i.d. x10 days.  5. The patient currently living at [facility] and expresses desire      to escape to [facility] as she feels she is too young for      retirement home and was encouraged per Dr. Victorino Dike to proceed      with her plan to relocate to [facility].      Mike Gip, PA-C  Electronically Signed      Ulyess Mort, MD  Electronically Signed   AE/MedQ  DD: 01/15/2007  DT: 01/15/2007  Job #: [id number]

## 2011-02-09 NOTE — Op Note (Signed)
[place]. [facility]  Patient:    CHASTELYN, ATHENS                         MRN: [id number] Proc. Date: 10/18/00 Adm. Date:  [id number] Attending:  Melvenia Needles CC:         Titus Dubin. Alwyn Ren, M.D. [facility]   Operative Report  PREOPERATIVE DIAGNOSIS:  Severe left internal carotid artery stenosis.  POSTOPERATIVE DIAGNOSIS:  Severe left internal carotid artery stenosis.  PROCEDURES:  Left carotid endarterectomy, Dacron patch angioplasty.  SURGEON:  Denman George, M.D.  ASSISTANTS:  Di Kindle. Edilia Bo, M.D., and [facility]  ANESTHESIA:  General endotracheal.  ANESTHESIOLOGIST:  Bedelia Person, M.D.  CLINICAL NOTE:  This is a 70 year old female referred for evaluation of left carotid stenosis.  She had a remote history of visual loss in her left eye. Doppler evaluation verified the severe left carotid stenosis.  The patient was seen and evaluated in the office.  It was recommended that she undergo left carotid endarterectomy for reduction of stroke risk.  The patient presented for surgery.  DESCRIPTION OF PROCEDURE:  Patient brought to the operating room in stable condition.  Placed in the supine position.  General endotracheal anesthesia induced.  Foley catheter and arterial line inserted.  Left neck prepped and draped in a sterile fashion.  Curvilinear skin incision made along the anterior border of the left sternomastoid muscle.  Incision extended and deepened through the subcutaneous tissue.  Dissection carried down with electrocautery to the platysma.  The platysma divided.  Deep dissection carried along the anterior border of the sternomastoid muscle.  The common carotid artery mobilized proximally and encircled with vessel loop.  Omohyoid muscle divided.  The carotid bifurcation exposed.  The origin of the superior thyroid and the external carotid encircled with vessel loops.  The internal carotid artery followed distally up to the  posterior belly of the digastric muscle.  The hypoglossal nerve reflected superiorly.  The vagus nerve identified posteriorly and preserved. The distal internal carotid artery encircled with a vessel loop.  The patient administered 6000 units of heparin intravenously.  Adequate circulation time permitted.  The carotid vessels controlled with clamps.  A longitudinal arteriotomy made in the distal common carotid artery.  The arteriotomy extended across the carotid bulb and up into the internal carotid artery.  There was approximately an 80% left internal carotid artery stenosis present. A shunt was inserted.  An endarterectomy elevator used to raise the plaque from the underlying vessel.  The endarterectomy carried down into the common carotid artery, where the plaque was divided transversely with Potts scissors.  The plaque then raised up into the bulb, where the superior thyroid and external carotid were endarterectomized using an eversion technique.  The plaque feathered well out of the internal carotid artery.  Fragments of plaque removed with plaque forceps.  The site irrigated with heparin and and saline solution.  A preclotted knitted Dacron patch was then placed over the endarterectomy site using running 6-0 Prolene suture.  The shunt was removed.  All vessels well flushed.  Clamps removed, directing initial antegrade flow up the external carotid artery.  Following this, the internal carotid artery was released.  Excellent pulse and Doppler signal of distal internal carotid artery.  The patient administered 30 mg of protamine intravenously.  The sternomastoid fascia then closed with running 2-0 Vicryl suture.  Platysma was closed with running 3-0 Vicryl sutures.  Skin closed  with 4-0 Monocryl. Half-inch Steri-Strips applied.  The patient transferred to recovery room in stable condition. DD:  10/18/00 TD:  10/19/00 Job: [id number]

## 2011-02-09 NOTE — Assessment & Plan Note (Signed)
[facility]                         GASTROENTEROLOGY OFFICE NOTE   SHANESHA, BEDNARZ                         MRN:          [id number]  DATE:09/04/2006                            DOB:          January 11, 1941    Jean [name] comes in, says she has not been taking her Miralax, feels very  bloated, gets abdominal pain, no blood in her stool.  She says her  bowels still cannot move.  She has had, on review of her chart,  bacterial overgrowth problems over the past, was treated with  tetracycline.  We had never followed this up any time in the recent  past.  She saw Dr. Christella Hartigan recently because she was having difficulty  urinating, and she was having some abdominal pain, complained of chronic  constipation, and she said she needed upper and lower endoscopies.  She  is worried she has a blockage of her bowel.  She is going to [facility]  over Christmas, I think she wanted to be checked out prior to that time.  She does have asthma intermittently and some rhinitis as well as  seasonal allergies, a history of GERD, lots of anxieties.  She did have  an upper endoscopy in November 2006, which revealed some Monilia, and  she also had some gastritis of chronic degree.  Her last colonoscopic  examination that I see was in 2001, it was done by Dr. Jarold Motto,  revealed some diverticular disease and some hemorrhoids.   PHYSICAL EXAMINATION:  Today she weighed 147, blood pressure 134/86,  pulse 75 and regular.  OROPHARYNX:  Negative.  NECK:  Negative.  CHEST:  Clear.  HEART:  Revealed a regular rate and rhythm without a significant murmur.  ABDOMEN:  Soft, no masses or organomegaly, nontender.  There were no  bruits or rubs.  RECTAL:  Deferred.   IMPRESSION:  1. Probably irritable bowel syndrome with a constipation component.  2. Asthma.  3. Anxiety and depression.  4. Gastroesophageal reflux disease.  5. Allergic rhinitis.  6. Tonsillectomy.  7. Hysterectomy and  appendectomy.  8. Status post carotid endarterectomy.  9. History of documented peptic ulcer disease.  10.Osteoporosis.  11.Diverticulosis and external hemorrhoids.   RECOMMENDATIONS:  We will try stronger laxatives.  I told her to try  some Senokot and Miralax in turns, Senokot to be taken at night and  Miralax until she has daily bowel activities.  I also gave her some  samples of some Xifaxan to take 2 b.i.d. for ten days, and to try some  Flora-Q.  Hopefully the above will help her.  I told her to call us back  when she returns from  [facility] after Christmas if she is not any better.  I WONDER WHETHER SHE  DOES NOT  NEED A COLONOSCOPIC EXAMINATION SOME TIME IN THE NEAR FUTURE IF HER  SYMPTOMS DO NOT ABATE.     Ulyess Mort, MD  Electronically Signed    SML/MedQ  DD: 09/04/2006  DT: 09/05/2006  Job #: [id number]

## 2011-02-09 NOTE — Discharge Summary (Signed)
NAMEMarland Kitchen  Jean [name], Jean [name] NO.:  [id number]   MEDICAL RECORD NO.:  [id number]                   PATIENT TYPE:  INP   LOCATION:  0448                                 FACILITY:  [facility]   PHYSICIAN:  Rene Paci, M.D. [facility]          DATE OF BIRTH:  10-Jun-1941   DATE OF ADMISSION:  12/20/2003  DATE OF DISCHARGE:  12/22/2003                                 DISCHARGE SUMMARY   DISCHARGE DIAGNOSES:  1. Abdominal pain. Question secondary to constipation versus resolving     diverticulosis, symptomatically improved.  2. Thrush secondary to recent antibiotic course for diverticulitis, improved     on Nystatin.  3. History of neuropathy.   DISCHARGE MEDICATIONS:  Nystatin swish and swallow one teaspoon t.i.d. times  one additional week.  No further antibiotics. Other medications as prior to  admission and include Elavil 50 mg p.o. q.h.s., Flonase p.r.n., Zantac  p.r.n.   DISPOSITION:  Discharged home.   CONDITION ON DISCHARGE:  Good.   DIET:  Tolerating regular diet without pain.   HOSPITAL FOLLOWUP:  To be arranged by patient and her primary care  physician, Dr. Debby Bud, in the next two to three weeks.   BRIEF HOSPITAL COURSE:   PROBLEM #1:  Abdominal pain.  Recent diverticulitis. The patient is a 70-  year-old woman who had been diagnosed as an outpatient with diverticulitis  approximately ten days prior to admission and treated with a clinically  diagnosed diverticulitis with Cipro plus Flagyl. She initially improved,  then had increasing intolerance of the medication secondary to thrush and  stopped the medication after approximately five days of treatment. After  three days of this she had again worsening of pain, persistent thrush and  saw her primary care physician for further information. Because of her pain  and being afebrile, she was sent over for a CT to re-evaluate diverticulitis  and to continue empiric therapy for this in addition to  beginning Nystatin  for her thrush. CT of the abdomen and pelvis showed diverticulosis without  any evidence of infection or abscess, and so no further need for antibiotic  treatment for this was pursued. Her white count was normal and she was  afebrile. Of note, the patient's abdominal pain improved greatly after a  bowel movement, prompted by the oral contrast for the CT. She is feeling  well, tolerating a regular diet, and improved on Nystatin for her thrush  symptoms. She is stable for discharge home. Other medications are as prior  to admission.                                               Rene Paci, M.D. [facility]    VL/MEDQ  D:  12/22/2003  T:  12/22/2003  Job:  [id number]

## 2011-02-09 NOTE — Assessment & Plan Note (Signed)
[facility]                         GASTROENTEROLOGY OFFICE NOTE   YANELLI, ZAPANTA                         MRN:          [id number]  DATE:08/26/2006                            DOB:          [birth year]    This is a patient of Ulyess Mort, M.D.   Ms. Blizard called today saying that she has not urinated very well in 3-  4 days.  She has also had some abdominal discomfort.  She was arranged  to be fit in for an urgent visit.   She says that since Friday, she has urinated very little, just a little  dribble a couple of times a day.  She has been drinking quite a lot of  fluids and this has not improved things.  She has gotten increasingly  uncomfortable low in her abdomen radiating down to both of her groins.  She has had no fevers or chills.  She has chronic constipation and has  not moved her bowels in 3 days.  This is a bit more than her usual  constipation.  She also has upper back pain.  She feels she needs upper  and lower endoscopies.   CURRENT MEDICATIONS:  Amitriptyline, chlordiazepoxide, clotrimazole, and  Zantac.   PHYSICAL EXAMINATION:  CONSTITUTIONAL:  Generally well appearing in no  acute distress.  NEUROLOGY:  Alert and oriented x3.  LUNGS:  Clear to auscultation bilaterally.  HEART:  Regular rate and rhythm.  ABDOMEN:  Soft, mild to moderately tender in her suprapubic region.  No  peritoneal signs.   ASSESSMENT/PLAN:  A 70 year old woman with bilateral lower abdominal  pain, trouble urinating for the past 3 days.  No fevers or chills.   Ms. Lauman was very adamant about having a colonoscopy and upper  endoscopy.  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 Corinda Gubler about a year ago showed no stricturing disease.  She does think that she has some difficulty swallowing at times.  She is  concerned that she may have a bowel blockage.  It is not clear if that  really is the case.  She does  have rather chronic constipation and she  moved her bowels Friday, but has not moved them since.  This is not very  abnormal for her, but is a bit out of the usual.  I recommend that she  try taking MiraLax for this.  The most striking thing is that she has  not really urinated very well in over 3 days.  I am concerned that she  may have a urinary blockage.  She became very tearful when I explained  that not urinating in 2-3 days can potentially be a serious problem and  she says she has several obligations tomorrow, the next day, and will be  traveling out of town soon.  I will arrange for her to have labs done  today including a complete metabolic profile, a CBC, urinalysis, and  urine culture.  I am also getting an obstructive series of her abdomen  just  to make sure there is  no air fluid levels or intestinal obstruction.  If  this workup is unrevealing, I will arrange for CT scan abd/pelvis.     Rachael Fee, MD  Electronically Signed    DPJ/MedQ  DD: 08/26/2006  DT: 08/27/2006  Job #: [id number]   cc:   Rosalyn Gess. Norins, MD  Ulyess Mort, MD

## 2011-02-09 NOTE — Assessment & Plan Note (Signed)
[facility]                               PULMONARY OFFICE NOTE   Jean [name], Jean [name]                         MRN:          [id number]  DATE:08/01/2006                            DOB:          Oct 08, 1940    PRIMARY PHYSICIAN:  Dr. Illene Regulus.   GI PHYSICIAN:  Dr. Victorino Dike.   PSYCHIATRY:  Dr. [name].   PROBLEM:  1. Asthma.  2. Allergic rhinitis.  3. Eustachian dysfunction.  4. Esophageal reflux.   HISTORY:  Since last here, she stopped Advair because she saw a TV ad that  worried her about osteoporosis from the cortisone component.  She had been  using the 100/50 dose.  She notes a little wheeze when she bends over but  otherwise not and she does not seem to have had any exacerbations or  significant shortness of breath.  She is using saline nasal spray and  Fluticasone.  This seems to have kept her nose open enough.  She does not  recognize active reflux symptoms and has not had any acute event.  She is  interested in getting her first pneumococcal vaccine and this year's flu  vaccine.  We discussed both.   MEDICATIONS:  1. Amitriptyline _________ 25 mg.  2. Clotrimazole 10 mg.  3. Zantac.  4. Fluticasone nasal spray.  5. Albuterol rescue inhaler used occasionally.   ALLERGIES:  Drug intolerant to PENICILLIN, SULFA, ZITHROMAX, DOXYCYCLINE  with GI upset, and MAGIC MOUTHWASH.   OBJECTIVE:  Weight 147 pounds, blood pressure 118/82, pulse regular 92, room  air saturation 96%.  She is talkative and seems comfortable.  There is some  white mucous in both nostrils, non obstructing, without visible polyps,  obvious postnasal drainage or pharyngeal erythema.  Voice quality is normal.  There is no stridor or neck vein distention.  Lungs are clear, breathing is  unlabored.  Heart sounds are regular without murmur.   IMPRESSION:  Stable rhinitis and mild intermittent asthma with potential for  exacerbations related to seasonal  allergy, reflux and viral triggers.  Currently she is doing well.   PLAN:  1. Continue nasal fluticasone.  2. Medication education done.  3. Stay off of Advair for now.  4. Flu vaccine and pneumococcal vaccine were discussed and given.  5. Schedule return 1 year, earlier p.r.n.     Clinton D. Maple Hudson, MD, Tonny Bollman, FACP  Electronically Signed    CDY/MedQ  DD: 08/04/2006  DT: 08/04/2006  Job #: [id number]

## 2011-02-09 NOTE — H&P (Signed)
NAMEMarland [name]  ASJIA, BERRIOS NO.:  [id number]   MEDICAL RECORD NO.:  [id number]                   PATIENT TYPE:  INP   LOCATION:  0448                                 FACILITY:  [facility]   PHYSICIAN:  Rosalyn Gess. Norins, M.D. [facility]         DATE OF BIRTH:  18-Oct-1940   DATE OF ADMISSION:  12/20/2003  DATE OF DISCHARGE:                                HISTORY & PHYSICAL   CHIEF COMPLAINT:  Abdominal pain.   HISTORY OF PRESENT ILLNESS:  Ms. Jean [name] is a 70 year old Caucasian woman who  was seen in the office approximately ten days prior to admission. She was  diagnosed by Dr. Posey Rea as having diverticulitis and was started on  Flagyl 250 mg t.i.d. and Ceftin 500 mg b.i.d. The patient completed seven  days of therapy and then developed a diffuse set of symptoms including  shaking all over and increasing tinnitus. She was advised to stop medication  on the 7th day of 10 days of treatment.  She presents to the office today  for follow up. Of note she continues to have abdominal pain and discomfort.  On examination, the patient is afebrile at 98.3. she is noted to be very  tender in the left lower quadrant of the abdomen. In addition, the patient  is complaining of painful throat and mouth, and thinks she has thrush. She  is now admitted for IV antibiotics, Nystatin, and for further evaluation to  rule out diverticular abscess.   PAST MEDICAL HISTORY:  Surgical: Tonsillectomy, remote hysterectomy in 1983  with an incidental appendectomy, carotid endarterectomy in 2001, trauma  fracture of the right wrist in 2001.  Medical history: The patient has had  anemia 20 years ago and is currently stable. She has a history of childhood  asthma with recurrence in the last year or two. She had the usual childhood  diseases. She has a history of diverticulitis times three with the last  episode in 2001. History of headache which she reports comes in rapid  succession and will have  long periods of time without headache. No formal  diagnosis has been made. History of significant GERD. History of irritable  bowel syndrome. The patient has undergone esophageal dilatation in the past.  She has a history of esophageal duodenoscopy, documented ulcers, but no  significant hemorrhage. History of allergies. History of mild hypertension.  History of hyperlipidemia with intolerance to statin drugs and Lopid.  History of osteoporosis, intolerant to Fosamax. History of tinnitus with  decreased hearing. History of retinal artery occlusion in 1985. History of  neuropathy of the left side that she attributes a dental procedure.   GYNECOLOGIC HISTORY:  The patient had normal menstrual history. She is a  gravida 2, para 2, and is status post hysterectomy as noted.   PHYSICIAN ROSTER:  Dr. Real Cons for gynecology; Dr. Sandria Manly for neurology; Dr.  Madilyn Fireman for CVTS; Dr. Andria Meuse for allergy; Dr. Venia Carbon  Montez Morita for orthopedics;  Dr. Sheryn Bison for GI.   CURRENT MEDICATIONS:  1. Amitriptyline only, unspecified dose.  2. Flonase p.r.n.  3. Zantac p.r.n.   FAMILY HISTORY:  Noted for alcoholism in her brother. Father with severe  arthritis. Sister with uterine cancer. Brother with prostate cancer. Heart  disease present in the father who died of a heart attack and grandfather as  well. Brother had a stroke. Hypertension in both parents.   SOCIAL HISTORY:  The patient has a 12th education with some community  college training. 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 She has  two sons, one who is overseas and one lives in [place]. The patient herself  recently returned from living in [place] and is working out a complex living  arrangement with her sister. The patient has two grown grandchildren.   PHYSICAL EXAMINATION:  VITAL SIGNS: On admission the patient is afebrile at  98.3, blood pressure 140/80, pulse 70, weight 142.  GENERAL APPEARANCE: This is a well-nourished woman who  looks her stated age.  She is in no acute distress, but is emotionally fragile.  HEENT: Normocephalic and atraumatic. Conjunctiva and sclera clear. EACs and  TMs normal. Oropharynx reveals a whitish coating to the tongue and posterior  pharynx.  NECK: Supple.  CHEST: Clear with no rales, rhonchi, or wheezes.  CARDIAC: 2+ radial pulse. She had a regular rate and rhythm without murmurs,  rubs, or gallops.  BREASTS: Deferred.  ABDOMEN: The patient had positive bowel sounds in all four quadrants. She  had significant tenderness in the left lower quadrant. No rebound was noted.  No peritonitis signs noted.  PELVIC/RECTAL EXAM: Deferred.  EXTREMITIES: Unremarkable.   ASSESSMENT/PLAN:  1. Diverticulitis. Patient with a history of diverticulitis in the past, now     presenting with incompletely treated diverticulitis with ongoing     abdominal pain and discomfort. Plan is to have the patient admitted to     the hospital. Will start her on IV Cipro 400 mg q.12h. Will continue     Flagyl at 500 mg t.i.d. Will give Phenergan 12.5 mg IV q.6h. for nausea.     Will obtain a CT scan of the abdomen and pelvis to confirm diverticulitis     and rule out diverticular abscess.  2. Thrush. Patient with probable thrush secondary to antibiotics. Plan is to     give the patient Nystatin swish and swallow q.4h.  3. Neuropathy. The patient will be continued on her amitriptyline.   DISPOSITION:  If the patient's laboratory is unremarkable, if her CT scan is  negative for diverticular disease, and if there is no signs of acute  diverticulitis, will try to convert the patient to oral Cipro, continue  Flagyl, and hopefully discharge the patient in 24-48 hours.                                               Rosalyn Gess Norins, M.D. [facility]    MEN/MEDQ  D:  12/20/2003  T:  12/20/2003  Job:  [id number]

## 2011-02-09 NOTE — H&P (Signed)
NAME:  Jean [name], Jean [name] NO.:  [id number]   MEDICAL RECORD NO.:  [id number]          PATIENT TYPE:  IPS   LOCATION:  0404                          [facility]ILITY[facility]  [facility]   PHYSICIAN:  Geoffery Lyons, M.D.      DATE OF BIRTH:  1940-10-27   DATE OF ADMISSION:  10/25/2004  DATE OF DISCHARGE:                         PSYCHIATRIC ADMISSION ASSESSMENT   IDENTIFYING INFORMATION:  This is a 70 year old divorced white female,  voluntarily admitted on October 25, 2004.   HISTORY OF PRESENT ILLNESS:  The patient presents with a history of  psychosis, having increased anxiety, paranoid ideation.  The patient  believes things were happening at her extended stay where the patient has  been living while looking for a home.  She has been hearing noises, feeling  like boxes have been dropping overhead.  People have been following her with  a car.  The patient feels that the only time she feels safe is with her  family.  She states that she used to feel safe when she was in a car but now  feels like the accelerator is just going without her even touching it.  The  patient reports that she has noted problems on herself such as a vein in her  arm that she has never noticed before.  She is wondering if the people at  this hotel have injected something into her that is causing her hurting  everywhere.  She also feels that these people have put something on line to  target her.  She states that she has been sleep satisfactorily, is eating  again satisfactorily.  The patient denies any hallucinations.   PAST PSYCHIATRIC HISTORY:  First admission to [facility], sees  Dr. Evelene Croon as an outpatient.   SOCIAL HISTORY:  She is a 70 year old divorced white female, has 2 older  children, 32 and 33.  Also has 2 grandchildren.  She has been living with a  friend.  She has a son that is currently living in [place].  The patient  states she was laid off while she was working at [employer].  The patient  moved  from [place], [place] 2 years ago, moving to [place].   FAMILY HISTORY:  None.   ALCOHOL DRUG HISTORY:  Nonsmoker, denies any drug use.  She reports she has  been drinking rarely.   PAST MEDICAL HISTORY:  Primary care Teren Zurcher is Dr. Maple Hudson at [place].  Sees  Dr. 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 Debby Bud.  Medical problems are status post carotid endarterectomy 4 years ago,  elevated cholesterol, reflux and asthma.   MEDICATIONS:  The patient has been on amitriptyline 15 mg at bedtime,  Librium 25 mg, prednisone for sinus infection which is currently being  tapered.   DRUG ALLERGIES:  STRONG ANTIBIOTICS, and DIFLUCAN.   PHYSICAL EXAMINATION:  The patient was assessed at [facility].  This is a  well-nourished, well-developed female, anxious appearing but otherwise no  acute distress.  Her temperature is 97.6, 95 heart rate, 20 respirations,  blood pressure is 166/88.  She  is 5 feet 2 inches, 142 pounds.   LABORATORY DATA:  CBC:  Hemoglobin is 15.1.  Urinalysis is negative.  Urine  drug screen is positive for benzodiazepines.  TSH is 1.793.   MENTAL STATUS EXAM:  She is an alert, elderly female, cooperative, fair eye  contact, nicely dressed.  Speech is rambling, tangential.  Mood is anxious,  affect is also very anxious.  Does not appear to be guarded.  Thought  processes are positive delusions with paranoid ideation, no hallucinations,  no suicide ideation.  Cognitive function intact.  Memory is fair, judgment  and insight are fair.  She is a poor, confusing historian.   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   AXIS II:  Deferred.   AXIS III:  Asthma, reflux, elevated cholesterol, and status post carotid  endarterectomy 4 years ago.   AXIS IV:  Problems with primary support group, housing problems, medical  problems.   AXIS V:  Current is 30, past year 56.   PLAN:  Stabilize  mood and thinking, place her on the 400 hall for close  monitoring.  We will resume her medications.  We will have Klonopin  available for anxiety.  We will obtain a CT scan to rule out any organic  lesions.  We will also initiate a small dose of an antipsychotic to decrease  the patient's psychotic symptoms.  We will obtain a family session with the  patient's support group.  The patient is to follow up with Dr. Evelene Croon.   TENTATIVE LENGTH OF CARE:  4-5 days or more depending on the patient's  response to medication.      JO/MEDQ  D:  11/03/2004  T:  11/03/2004  Job:  [id number]

## 2011-02-23 ENCOUNTER — Encounter: Payer: Self-pay | Admitting: Nurse Practitioner

## 2011-02-23 ENCOUNTER — Ambulatory Visit (INDEPENDENT_AMBULATORY_CARE_PROVIDER_SITE_OTHER): Payer: Medicare Other | Admitting: Nurse Practitioner

## 2011-02-23 ENCOUNTER — Telehealth: Payer: Self-pay | Admitting: Internal Medicine

## 2011-02-23 VITALS — BP 130/80 | HR 80 | Ht 65.0 in | Wt 134.2 lb

## 2011-02-23 DIAGNOSIS — R1032 Left lower quadrant pain: Secondary | ICD-10-CM | POA: Insufficient documentation

## 2011-02-23 DIAGNOSIS — K5909 Other constipation: Secondary | ICD-10-CM

## 2011-02-23 DIAGNOSIS — K5900 Constipation, unspecified: Secondary | ICD-10-CM

## 2011-02-23 MED ORDER — POLYETHYLENE GLYCOL 3350 17 GM/SCOOP PO POWD: ORAL | Status: DC

## 2011-02-23 MED ORDER — RIFAXIMIN 200 MG PO TABS: ORAL_TABLET | ORAL | Status: DC

## 2011-02-23 NOTE — Assessment & Plan Note (Addendum)
Recurrent left lower quadrant pain associated with acute on chronic constipation. Her pain is reminiscent of previous episodes. Patient gives a history of diverticulitis, I think she may be confusing this with diverticulosis. Will restart MiraLax and give her another course of Xifaxan as she had success with that a few years ago.  We called the pharmacy to check on the cost of Xifaxan for the patient. The patient does have mild to moderate left lower quadrant tenderness on exam so diverticulitis is not excluded. Will check a CBC. If symptoms don't rapidly improve with bowel purge and Xifaxan will treat her empirically for diverticulitis. Patient asked to call our office condition update on Monday morning.

## 2011-02-23 NOTE — Progress Notes (Signed)
Reviewed and agree with management. Karam Dunson T. Naava Janeway MD FACG 

## 2011-02-23 NOTE — Telephone Encounter (Signed)
Patient did come for appointment today.

## 2011-02-23 NOTE — Assessment & Plan Note (Signed)
Patient has tried various therapies through the years. For unclear reasons she has been off MiraLax but would like to give it another try.

## 2011-02-23 NOTE — Telephone Encounter (Signed)
Patient c/o LLQ pain and constipation that has been present for 3 days.  She has put herself on a clear liquid diet and is asking for antibiotics to be called in.  The patient hasn't been seen here in 2 years.  She is asking for the medicine Dr Doreatha Martin used to give me samples for per chart review was xifaxan.  Discussed with Willette Cluster RNP I am awating a return call to see if she can see her this afternoon.

## 2011-02-23 NOTE — Progress Notes (Signed)
Jean [name] [id number] [birth year]   HISTORY OR PRESENT ILLNESS : Ms. Crayton is a 70 year old female followed by Dr. Leone Payor for history of constipation predominant irritable bowel syndrome. Patient gives a history of diverticulitis but I am unable to find any documentation of such.  CT scans in 2005 and 2007 for abdominal pain revealed diverticulosis without diverticulitis.  Her recurrent left lower quadrant pain started Tuesday, it is constant. She is bloated and constipated as usual, tried warm prune juice without relief. She no longer takes MiraLax, unsure why. She is interested in trying it again. Xifaxan has helped these exact symptoms in the past and patient would like a refill but concerned about the cost.    Current Medications, Allergies, Past Medical History, Past Surgical History, Family History and Social History were reviewed in [employer] record.   PHYSICAL EXAMINATION : General: Well developed white female in no acute distress Head: Normocephalic and atraumatic Eyes:  conjunctive pink. Poor vision (legally blind) Ears: Normal auditory acuity Mouth: No deformity or lesions Neck: Supple, no masses.  Lungs: Clear throughout to auscultation Heart: Regular rate and rhythm; no murmurs heard Abdomen: Soft, nondistended, mild to moderate left lower quadrant tenderness .No masses or hepatomegaly noted. Normal bowel sounds Rectal: Not done Musculoskeletal: Symmetrical with no gross deformities  Skin: No lesions on visible extremities Extremities: No edema or deformities noted Neurological: Alert oriented x 4, grossly nonfocal Cervical Nodes:  No significant cervical adenopathy Psychological:  Alert and cooperative. Normal mood and affect  ASSESSMENT AND PLAN :

## 2011-02-23 NOTE — Patient Instructions (Signed)
Please go to the basement level to have your labs drawn.  We phoned in a prescription at [facility] for [place].  Take 1 dose of Miralax before bedtime. If you do not have a bowel movement, then take 2 doses daily until you feel like you have moved your bowels on a regular basis. Once regular you can take Miralax once daily.

## 2011-02-23 NOTE — Telephone Encounter (Signed)
Per Willette Cluster RNP patient to be put on to be seen this afternoon at 2:30.  The patient has another appt today with her eye doctor she is to call if she can't be here on time and will review with Willette Cluster RNP at the time.

## 2011-02-27 ENCOUNTER — Telehealth: Payer: Self-pay | Admitting: *Deleted

## 2011-02-27 NOTE — Telephone Encounter (Signed)
Message copied by Daphine Deutscher on Tue Feb 27, 2011 11:32 AM ------      Message from: Meredith Pel      Created: Mon Feb 26, 2011 10:05 PM       Yes, I wanted labs. Can you find out how she feels. If still having abdominal pain then I need to know because she will definitely need labs and treatment for what could be diverticulitis. Thanks      ----- Message -----         From: Daphine Deutscher, RN         Sent: 02/26/2011   2:44 PM           To: Meredith Pel, NP            Gunnar Fusi,      It looks as if you ordered a CBC/diff on this patient on 02/23/11. I do not see that she had it drawn.      Rene Kocher

## 2011-02-27 NOTE — Telephone Encounter (Signed)
Spoke with patient and she is feeling better. A little abdominal pain but nothing like last week. She is not able to get labs until Thursday. Spoke with Willette Cluster, NP and patient does not have to get labs but she should schedule an OV in the next couple of weeks. Patient aware. She wants to call back and schedule the OV since she has had a death in her family.

## 2011-03-14 ENCOUNTER — Telehealth: Payer: Self-pay | Admitting: *Deleted

## 2011-03-14 NOTE — Telephone Encounter (Signed)
Called patient about scheduling follow up from Willette Cluster, NP visit. Patient wants to call me tomorrow to schedule after she talks with her son.

## 2011-03-14 NOTE — Telephone Encounter (Signed)
Message copied by Daphine Deutscher on Wed Mar 14, 2011  4:29 PM ------      Message from: Daphine Deutscher      Created: Tue Feb 27, 2011 12:51 PM       Did patient schedule a OV for f/u per Gunnar Fusi?

## 2011-03-15 NOTE — Telephone Encounter (Signed)
Spoke with patient and scheduled her with Willette Cluster, NP on 03/20/11 at 10:30 AM.

## 2011-03-20 ENCOUNTER — Ambulatory Visit (INDEPENDENT_AMBULATORY_CARE_PROVIDER_SITE_OTHER): Admitting: Nurse Practitioner

## 2011-03-20 ENCOUNTER — Encounter: Payer: Self-pay | Admitting: Nurse Practitioner

## 2011-03-20 VITALS — BP 130/90 | HR 72 | Ht 61.0 in | Wt 133.6 lb

## 2011-03-20 DIAGNOSIS — K589 Irritable bowel syndrome without diarrhea: Secondary | ICD-10-CM

## 2011-03-20 NOTE — Patient Instructions (Addendum)
Call us if any recurrent pain.  Call [phone] and ask for [facility] or Dr Marvell Fuller nurse Lavonna Rua.

## 2011-03-20 NOTE — Assessment & Plan Note (Signed)
Constipation predominant irritable bowel syndrome associated with left lower quadrant pain. Symptoms resolved after course of Xifaxan and resumption of MiraLax. Patient is up-to-date on her colorectal cancer screening. She will call us as needed for any recurrent pain, worsening constipation, or other gastrointestinal problems.

## 2011-03-20 NOTE — Progress Notes (Signed)
HAELYN FORGEY [id number] 1941/09/24   HISTORY OR PRESENT ILLNESS : 70 year old female known to Dr. Leone Payor for history of constipation predominant irritable bowel syndrome. I saw the patient on June 1st  for evaluation of recurrent left lower abdominal pain and constipation. Patient had been off MiraLax for unclear reasons. She had tried several things such as prune juice without much improvement. For constipation the patient was restarted on MiraLax. She was also given a course of Xifaxan which has helped her with these exact symptoms in the past. Ms. Blacklock comes in today feeling much better. She is taking MiraLax daily along with Colace. Her left lower quadrant pain has totally resolved. She is walking for exercise.  Current Medications, Allergies, Past Medical History, Past Surgical History, Family History and Social History were reviewed in [employer] record.   PHYSICAL EXAMINATION : General: Well developed  female in no acute distress Head: Normocephalic and atraumatic Eyes:  sclerae anicteric,conjunctive pink. Mouth: No deformity or lesions Neck: Supple, no masses.  Lungs: Clear throughout to auscultation Heart: Regular rate and rhythm; no murmurs heard Abdomen: Soft, nondistended, nontender. No masses or hepatomegaly noted. Normal bowel sounds Rectal: not done Musculoskeletal: Symmetrical with no gross deformities  Skin: No lesions on visible extremities Extremities: No edema or deformities noted Neurological: Alert oriented x 4, grossly nonfocal Cervical Nodes:  No significant cervical adenopathy Psychological:  Alert and cooperative. Normal mood and affect  ASSESSMENT AND PLAN :

## 2011-03-21 NOTE — Progress Notes (Signed)
Agree 

## 2011-03-26 ENCOUNTER — Telehealth: Payer: Self-pay | Admitting: Internal Medicine

## 2011-03-26 MED ORDER — FLUTICASONE PROPIONATE 50 MCG/ACT NA SUSP: 2.0000 | Freq: Every day | NASAL | Status: DC | PRN

## 2011-03-26 NOTE — Telephone Encounter (Signed)
Pt requesting refill on flonase. I advised needs to sched appt, last seen over 1 yr ago. Pt is now sched to see CDY on 04/24/11 at 3 pm. She states will keep appt to get more refills.

## 2011-03-26 NOTE — Telephone Encounter (Signed)
Will close encounter, as this was creates in error and was under the wrong pt's chart.

## 2011-04-24 ENCOUNTER — Ambulatory Visit (INDEPENDENT_AMBULATORY_CARE_PROVIDER_SITE_OTHER): Payer: Medicare Other | Admitting: Internal Medicine

## 2011-04-24 ENCOUNTER — Encounter: Payer: Self-pay | Admitting: Internal Medicine

## 2011-04-24 VITALS — BP 120/80 | HR 83 | Ht 61.0 in | Wt 131.8 lb

## 2011-04-24 DIAGNOSIS — J45909 Unspecified asthma, uncomplicated: Secondary | ICD-10-CM

## 2011-04-24 DIAGNOSIS — J309 Allergic rhinitis, unspecified: Secondary | ICD-10-CM

## 2011-04-24 MED ORDER — ALBUTEROL SULFATE HFA 108 (90 BASE) MCG/ACT IN AERS: 2.0000 | INHALATION_SPRAY | Freq: Four times a day (QID) | RESPIRATORY_TRACT | Status: DC | PRN

## 2011-04-24 NOTE — Patient Instructions (Signed)
Try neosporin ointment twice daily for the sore in your right nostril   Refill script for Proair rescue inhaler

## 2011-04-24 NOTE — Assessment & Plan Note (Signed)
Mild intermittent. Current meds are sufficient.

## 2011-04-24 NOTE — Assessment & Plan Note (Signed)
Ok to continue fluticasone. Suggested neosporin for the sore in right nostril

## 2011-04-24 NOTE — Progress Notes (Signed)
Subjective:    Patient ID: Jean [name], female    DOB: 1941/02/18, 70 y.o.   MRN: [id number]  HPI 04/24/11- [age]F never smoker Last here December 09, 2009.  Since last here only needed rescue inhaler 2-3 times since last here. Watery nose at times, with sneezing.  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 Son drove her here.  Incidental small sore just came up in right nostril at verge.   Review of Systems Constitutional:   No-   weight loss, night sweats, fevers, chills, fatigue, lassitude. HEENT:   No-   headaches, difficulty swallowing, tooth/dental problems, sore throat,                  No-   sneezing, itching, ear ache, nasal congestion, post nasal drip,   CV:  No-   chest pain, orthopnea, PND, swelling in lower extremities, anasarca,                                  dizziness, palpitations  GI:  No-   heartburn, indigestion, abdominal pain, nausea, vomiting, diarrhea,                 change in bowel habits, loss of appetite  Resp: No-   shortness of breath with exertion or at rest.  No-  excess mucus,             No-   productive cough,  No non-productive cough,  No-  coughing up of blood.              No-   change in color of mucus.  No- wheezing.    Skin: No-   rash or lesions.  GU: No-   dysuria, change in color of urine, no urgency or frequency.  No- flank pain.  MS:  No-   joint pain or swelling.  No- decreased range of motion.  No- back pain.  Psych:  No- change in mood or affect. No depression or anxiety.  No memory loss.      Objective:   Physical Exam General- Alert, Oriented, Affect-appropriate, Distress- none acute   calm Skin- rash-none, lesions- none, excoriation- none Lymphadenopathy- none Head- atraumatic            Eyes-PERRLA, conjunctivae clear secretions   Able to see me only as a large shape.             Ears- Hearing, canals normal            Nose- Clear, No-Septal dev, mucus, polyps, erosion, perforation   Small  foliculitis right nostril            Throat- Mallampati II , mucosa clear , drainage- none, tonsils- atrophic Neck- flexible , trachea midline, no stridor , thyroid nl, carotid no bruit Chest - symmetrical excursion , unlabored           Heart/CV- RRR , no murmur , no gallop  , no rub, nl s1 s2                           - JVD- none , edema- none, stasis changes- none, varices- none           Lung- clear to P&A, wheeze- none, cough- none , dullness-none, rub- none           Chest wall-  Abd- tender-no, distended-no, bowel sounds-present, HSM- no Br/ Gen/ Rectal- Not done, not indicated Extrem- cyanosis- none, clubbing, none, atrophy- none, strength- nl Neuro- grossly intact to observation         Assessment & Plan:   No problem-specific assessment & plan notes found for this encounter.

## 2011-07-12 ENCOUNTER — Other Ambulatory Visit: Payer: Self-pay | Admitting: Internal Medicine

## 2011-07-13 ENCOUNTER — Telehealth: Payer: Self-pay | Admitting: Internal Medicine

## 2011-07-13 MED ORDER — FLUTICASONE PROPIONATE 50 MCG/ACT NA SUSP: 2.0000 | Freq: Every day | NASAL | Status: DC | PRN

## 2011-07-13 NOTE — Telephone Encounter (Signed)
Pt aware rx for flonase was sent to the pharmacy. Nothing further was needed

## 2011-12-25 ENCOUNTER — Telehealth: Payer: Self-pay | Admitting: *Deleted

## 2011-12-25 NOTE — Telephone Encounter (Signed)
Patient requesting Rx for Lift Chair [last OV 04.17.12].

## 2011-12-25 NOTE — Telephone Encounter (Signed)
Last ov April '12. No documentation of disability that justifies lift chair. Recommend OV

## 2011-12-25 NOTE — Telephone Encounter (Signed)
OV scheduled for Mon 04.08.13 @ 11:00am/SLS

## 2011-12-31 ENCOUNTER — Encounter: Payer: Self-pay | Admitting: Internal Medicine

## 2011-12-31 ENCOUNTER — Ambulatory Visit (INDEPENDENT_AMBULATORY_CARE_PROVIDER_SITE_OTHER): Payer: Medicare Other | Admitting: Internal Medicine

## 2011-12-31 VITALS — BP 128/98 | HR 74 | Temp 97.5°F | Resp 14 | Wt 131.0 lb

## 2011-12-31 DIAGNOSIS — F329 Major depressive disorder, single episode, unspecified: Secondary | ICD-10-CM

## 2011-12-31 DIAGNOSIS — I779 Disorder of arteries and arterioles, unspecified: Secondary | ICD-10-CM

## 2011-12-31 DIAGNOSIS — I10 Essential (primary) hypertension: Secondary | ICD-10-CM

## 2011-12-31 DIAGNOSIS — E785 Hyperlipidemia, unspecified: Secondary | ICD-10-CM

## 2011-12-31 DIAGNOSIS — H469 Unspecified optic neuritis: Secondary | ICD-10-CM

## 2011-12-31 MED ORDER — AMLODIPINE BESYLATE 5 MG PO TABS: 5.0000 mg | ORAL_TABLET | Freq: Every day | ORAL | Status: DC

## 2011-12-31 MED ORDER — EZETIMIBE 10 MG PO TABS: 10.0000 mg | ORAL_TABLET | Freq: Every day | ORAL | Status: DC

## 2011-12-31 NOTE — Patient Instructions (Signed)
Lift - chair - I am sorry to say that I cannot attest to your inability to rise from a chair since you were able to rise from the chair today. You need to look for a chair with a foot rest that is more firm and more shallow than you Lazyboy.  Blood pressure - will start amlodipine 5 mg once a day to bring your blood pressure under better control.   Cholesterol - last lab revealed an LDL of 167 with a goal of 119 or less due to your history of carotid artery disease. Plan is to start Zetia 10 mg once a day and to return for lab in 1 month.  Carotid artery disease - will get you scheduled for a repeat carotid doppler exam at [facility] on [address].

## 2011-12-31 NOTE — Progress Notes (Signed)
Subjective:    Patient ID: Jean [name], female    DOB: October 28, 1940, 71 y.o.   MRN: [id number]  HPI Jean [name] presents for certification as to need for lift-chair. She has a lazy boy and it is very difficult to get up.  She has had declining vision now with just peripheral vision remaining.   Her psychiatric condition managed by Dr. Donell Beers: she remains on Navane, ativan, elavil.  BP has been up a litte: 140's/90s on furosemide as single agent.   She continues to have joint pain unrelieved by meloxicam.   Past Medical History  Diagnosis Date  . Allergy   . Asthma   . Esophageal reflux   . Dysfunction of eustachian tube   . Irritable bowel syndrome with constipation   . Diverticulosis   . Peptic ulcer disease   . Osteoporosis   . Peripheral neuropathy   . Trigeminal neuralgia     prior injury  . 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 Appendectomy   . Abdominal hysterectomy   . Tonsillectomy   . Carotid endarterectomy    Family History  Problem Relation Age of Onset  . Alzheimer's disease Mother   . Coronary artery disease Father   . Heart attack Father   . Heart disease Father     AAA  . Ovarian cancer Sister   . Lung cancer Brother   . Heart disease Brother     AAA  . Diabetes Neg Hx   . Colon cancer Neg Hx    History   Social History  . Marital Status: Divorced    Spouse Name: N/A    Number of Children: 2  . Years of Education: N/A   Occupational History  . retired    Social History Main Topics  . Smoking status: Never Smoker   . Smokeless tobacco: Not on file  . Alcohol Use: No  . Drug Use: No  . Sexually Active: Not on file   Other Topics Concern  . Not on file   Social History Narrative   HSG, business collegeMarried 59-37yrs/divorced2 son-'60; 2 granddaughtersWork: Diplomatic Services operational officer, travel clerkLives-friends home in a studio apartmentPatient  never smoked. Did have second hand smoke exposure childhood and work    Current Outpatient Prescriptions on File Prior to Visit  Medication Sig Dispense Refill  . albuterol (PROAIR HFA) 108 (90 BASE) MCG/ACT inhaler Inhale 2 puffs into the lungs 4 (four) times daily as needed for wheezing or shortness of breath.  1 Inhaler  prn  . amitriptyline (ELAVIL) 75 MG tablet Take by mouth at bedtime.        Marland Kitchen aspirin 81 MG EC tablet Take 81 mg by mouth daily.        . Calcium Carbonate-Vitamin D (CALTRATE 600+D) 600-400 MG-UNIT per tablet Take 1 tablet by mouth 2 (two) times daily.        . carboxymethylcellulose (REFRESH) 1 % ophthalmic solution Apply 2 drops to eye 3 (three) times daily.        . chlordiazePOXIDE (LIBRIUM) 25 MG capsule Take 25 mg by mouth at bedtime and may repeat dose one time if needed.        . Cholecalciferol (VITAMIN D3) 1000 UNITS CAPS Take 2 capsules by mouth daily.        . Coenzyme Q10 (COQ-10 PO) Take 1 tablet by mouth daily.        Marland Kitchen  docusate sodium (COLACE) 100 MG capsule Take 200 mg by mouth 2 (two) times daily.       . famotidine (PEPCID) 20 MG tablet       . fish oil-omega-3 fatty acids 1000 MG capsule Take 2 g by mouth daily.       . fluticasone (FLONASE) 50 MCG/ACT nasal spray Place 2 sprays into the nose daily as needed.  16 g  3  . furosemide (LASIX) 20 MG tablet Take 20 mg by mouth every morning. 1 by mouth every am diuretic for peripheral edema       . LORazepam (ATIVAN) 0.5 MG tablet Take 0.5 mg by mouth 2 (two) times daily.        . Multiple Vitamins-Minerals (CENTRUM SILVER PO) Take 1 tablet by mouth.        . thiothixene (NAVANE) 2 MG capsule        Current Facility-Administered Medications on File Prior to Visit  Medication Dose Route Frequency Provider Last Rate Last Dose  . zoster vaccine live (PF) (ZOSTAVAX) injection 19,400 Units  0.65 mL Subcutaneous Once Jacques Navy, MD          Review of Systems System review is negative for any  constitutional, cardiac, pulmonary, GI or neuro symptoms or complaints other than as described in the HPI.     Objective:   Physical Exam Filed Vitals:   12/31/11 1115  BP: 128/98  Pulse: 74  Temp: 97.5 F (36.4 C)  Resp: 14   Wt Readings from Last 3 Encounters:  12/31/11 131 lb (59.421 kg)  04/24/11 131 lb 12.8 oz (59.784 kg)  03/20/11 133 lb 9.6 oz (60.601 kg)   Gwen'l- WNWD white woman with mild psycho-motor retardation HEENT- [place]/AT, C&S clear Neck well healed CEA scare left. Mild asymmetry in the anterior neck Chest- Clear Cor - 2+ raidal , RRR, no carotids.  Neuro - slowed affect, able to stand from seated position w/o assistance.          Assessment & Plan:  Evaluation for lift chair - patient is able to rise from seated position w/o assistance. Does not qualify for lift chair.

## 2012-01-01 NOTE — Assessment & Plan Note (Signed)
Lab Results  Component Value Date   CHOL  Value: 258               12/31/2010   HDL 65 12/31/2010   LDLCALC  Value: 167         12/31/2010   LDLDIRECT 195.0 05/11/2010   TRIG 132 12/31/2010   CHOLHDL 4.0 12/31/2010   Patient with vascular disease, s/p CAE. She has been intolerant of "statins."  Plan - start zetia 10 mg daily           Follow-up lab in 1 month with recommendations to follow

## 2012-01-01 NOTE — Assessment & Plan Note (Signed)
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 Donell Beers. She is on a complex medical regimen and seems to be doing well

## 2012-01-01 NOTE — Assessment & Plan Note (Signed)
No bruits on exam.  Plan - follow-up carotid doppler

## 2012-01-01 NOTE — Assessment & Plan Note (Signed)
BP Readings from Last 3 Encounters:  12/31/11 128/98  04/24/11 120/80  03/20/11 130/90   Good control on present medications

## 2012-01-01 NOTE — Assessment & Plan Note (Signed)
Stable with significant visual loss  Plan - follow up with Dr. Elmer Picker

## 2012-01-16 DIAGNOSIS — Z0279 Encounter for issue of other medical certificate: Secondary | ICD-10-CM

## 2012-02-01 ENCOUNTER — Other Ambulatory Visit: Payer: Self-pay | Admitting: Nurse Practitioner

## 2012-02-11 DIAGNOSIS — Z0279 Encounter for issue of other medical certificate: Secondary | ICD-10-CM

## 2012-03-19 ENCOUNTER — Other Ambulatory Visit: Payer: Self-pay | Admitting: Cardiology

## 2012-03-19 DIAGNOSIS — I6529 Occlusion and stenosis of unspecified carotid artery: Secondary | ICD-10-CM

## 2012-03-24 ENCOUNTER — Encounter (INDEPENDENT_AMBULATORY_CARE_PROVIDER_SITE_OTHER): Payer: Medicare Other

## 2012-03-24 ENCOUNTER — Other Ambulatory Visit (INDEPENDENT_AMBULATORY_CARE_PROVIDER_SITE_OTHER): Payer: Medicare Other

## 2012-03-24 DIAGNOSIS — I10 Essential (primary) hypertension: Secondary | ICD-10-CM

## 2012-03-24 DIAGNOSIS — E785 Hyperlipidemia, unspecified: Secondary | ICD-10-CM

## 2012-03-24 DIAGNOSIS — I6529 Occlusion and stenosis of unspecified carotid artery: Secondary | ICD-10-CM

## 2012-03-24 LAB — COMPREHENSIVE METABOLIC PANEL
ALT: 12 U/L (ref 0–35)
Albumin: 3.9 g/dL (ref 3.5–5.2)
BUN: 16 mg/dL (ref 6–23)
CO2: 28 mEq/L (ref 19–32)
Calcium: 9 mg/dL (ref 8.4–10.5)
Chloride: 102 mEq/L (ref 96–112)
Creatinine, Ser: 0.9 mg/dL (ref 0.4–1.2)
GFR: 69.74 mL/min (ref >60.00)

## 2012-03-24 LAB — LIPID PANEL: HDL: 80.6 mg/dL (ref >39.00)

## 2012-03-31 ENCOUNTER — Encounter: Payer: Self-pay | Admitting: Internal Medicine

## 2012-03-31 DIAGNOSIS — Z0279 Encounter for issue of other medical certificate: Secondary | ICD-10-CM

## 2012-04-01 ENCOUNTER — Telehealth: Payer: Self-pay | Admitting: *Deleted

## 2012-04-01 NOTE — Telephone Encounter (Signed)
Called patient. No answer. Will try later.  

## 2012-04-01 NOTE — Telephone Encounter (Signed)
Message copied by Elnora Morrison on Tue Apr 01, 2012  9:19 AM ------      Message from: Jacques Navy      Created: Tue Apr 01, 2012  8:05 AM       Please call patient: carotid doppler study was normal with no blockages.

## 2012-04-07 ENCOUNTER — Telehealth: Payer: Self-pay | Admitting: General Practice

## 2012-04-07 NOTE — Telephone Encounter (Signed)
Jacki Cones called from [facility] in regard to a signed medication order for July on Jean [name]  dob Oct 29, 1940.  Relayed this information to [employer].

## 2012-05-13 ENCOUNTER — Ambulatory Visit: Payer: Medicare Other | Admitting: Internal Medicine

## 2012-05-28 DIAGNOSIS — Z0279 Encounter for issue of other medical certificate: Secondary | ICD-10-CM

## 2012-06-25 ENCOUNTER — Telehealth: Payer: Self-pay | Admitting: *Deleted

## 2012-06-25 MED ORDER — EZETIMIBE 10 MG PO TABS: 10.0000 mg | ORAL_TABLET | Freq: Every day | ORAL | Status: DC

## 2012-06-25 NOTE — Telephone Encounter (Signed)
Yes please - needs to take zetia.

## 2012-06-25 NOTE — Telephone Encounter (Signed)
APARTMENT NURSE AT [facility] CALLED TO RESPOND TO LETTER SENT TO PATIENT SON OF QUESTION IF PATIENT IS STILL TAKING HER ZETIA? NURSE SANTIANNA LETTING YOU KNOW PATIENT IS NOT TAKING ZETIA. WAS NOT ON MEDICATION ORDER. IF YOU WILL SEND A NEW SCRIPT FOR ZETIA SHE WILL RESUME THE MEDICATION.Marland Kitchen FAX TO [facility] #[phone]. ANY QUESTIONS CALL SANTANNA AT [phone]

## 2012-06-26 ENCOUNTER — Telehealth: Payer: Self-pay | Admitting: *Deleted

## 2012-06-26 NOTE — Telephone Encounter (Signed)
Rx faxed to  [facility] to [facility] for patient to continue medication Zetia.

## 2012-07-07 ENCOUNTER — Telehealth: Payer: Self-pay | Admitting: Internal Medicine

## 2012-07-07 MED ORDER — POLYETHYLENE GLYCOL 3350 17 GM/SCOOP PO POWD: 17.0000 g | ORAL | Status: DC | PRN

## 2012-07-07 NOTE — Telephone Encounter (Signed)
Miralax refilled as requested.

## 2013-01-27 ENCOUNTER — Other Ambulatory Visit: Payer: Self-pay | Admitting: Internal Medicine

## 2013-01-30 ENCOUNTER — Encounter: Payer: Self-pay | Admitting: Internal Medicine

## 2013-01-30 ENCOUNTER — Ambulatory Visit (INDEPENDENT_AMBULATORY_CARE_PROVIDER_SITE_OTHER): Payer: Medicare Other | Admitting: Internal Medicine

## 2013-01-30 VITALS — BP 104/70 | HR 77 | Ht 61.0 in | Wt 137.0 lb

## 2013-01-30 DIAGNOSIS — J4520 Mild intermittent asthma, uncomplicated: Secondary | ICD-10-CM

## 2013-01-30 DIAGNOSIS — J45909 Unspecified asthma, uncomplicated: Secondary | ICD-10-CM

## 2013-01-30 DIAGNOSIS — J309 Allergic rhinitis, unspecified: Secondary | ICD-10-CM

## 2013-01-30 DIAGNOSIS — J302 Other seasonal allergic rhinitis: Secondary | ICD-10-CM

## 2013-01-30 MED ORDER — FLUTICASONE PROPIONATE 50 MCG/ACT NA SUSP: 2.0000 | Freq: Every day | NASAL | Status: DC | PRN

## 2013-01-30 NOTE — Progress Notes (Signed)
Subjective:    Patient ID: Jean [name], female    DOB: 03/29/1941, 72 y.o.   MRN: [id number]  HPI 04/24/11- [age]F never smoker Last here December 09, 2009.  Since last here only needed rescue inhaler 2-3 times since last here. Watery nose at times, with sneezing.  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 Son drove her here.  Incidental small sore just came up in right nostril at verge.   01/30/13- 71 yoF never smoker followed for asthma, allergic rhinitis FOLLOWS FOR: Breathing has been doing well. Denies chest pain/tightness, SOB or coughing. Still using Flonase, it is working well. She states she no longer wheezes and has not been needing a rescue inhaler. Her son can take her for a 1 mile walk without noticing wheezing. She asks refill of Flonase and denies epistaxis.  ROS-see HPI Constitutional:   No-   weight loss, night sweats, fevers, chills, fatigue, lassitude. HEENT:   No-  headaches, difficulty swallowing, tooth/dental problems, sore throat,       No-  sneezing, itching, ear ache, +nasal congestion, post nasal drip,  CV:  No-   chest pain, orthopnea, PND, swelling in lower extremities, anasarca,                                  dizziness, palpitations Resp: No-   shortness of breath with exertion or at rest.              No-   productive cough,  No non-productive cough,  No- coughing up of blood.              No-   change in color of mucus.  No- wheezing.   Skin: No-   rash or lesions. GI:  No-   heartburn, indigestion, abdominal pain, nausea, vomiting,  GU: . MS:  No-   joint pain or swelling.   Neuro-     nothing unusual Psych:  No- change in mood or affect. No depression or anxiety.  No memory loss.    Objective:   Physical Exam General- Alert, Oriented, Affect-appropriate, Distress- none acute   calm Skin- rash-none, lesions- none, excoriation- none Lymphadenopathy- none Head- atraumatic            Eyes-Nearly blind, able to see me only as a  large shape.             Ears- Hearing, canals normal            Nose- Clear, No-Septal dev, mucus, polyps, erosion, perforation               Throat- Mallampati II , mucosa clear , drainage- none, tonsils- atrophic Neck- flexible , trachea midline, no stridor , thyroid nl, carotid no bruit Chest - symmetrical excursion , unlabored           Heart/CV- RRR , no murmur , no gallop  , no rub, nl s1 s2                           - JVD- none , edema- none, stasis changes- none, varices- none           Lung- clear to P&A, wheeze- none, cough- none , dullness-none, rub- none           Chest wall-  Abd-  Br/ Gen/ Rectal- Not done, not indicated  Extrem- cyanosis- none, clubbing, none, atrophy- none, strength- nl Neuro- grossly intact to observation         Assessment & Plan:

## 2013-01-30 NOTE — Patient Instructions (Addendum)
Script for Flonase sent  Please call as needed   You can ask Dr Debby Bud if he would like to refill your Flonase in the future, so you would have one less doctor to see.

## 2013-02-11 NOTE — Assessment & Plan Note (Signed)
This has not been active in considerable time. She has not been wheezing and has not needed her rescue inhaler. It may be a resolved problem.

## 2013-02-11 NOTE — Assessment & Plan Note (Signed)
Seasonal exacerbation. I will refill Flonase this time as discussed. To simplify things for her, I suggested that she get her primary physician to fill this in the future, since it is all she is needing now from me.

## 2013-03-27 ENCOUNTER — Other Ambulatory Visit: Payer: Self-pay | Admitting: Internal Medicine

## 2013-03-27 MED ORDER — CHLORDIAZEPOXIDE HCL 25 MG PO CAPS: 25.0000 mg | ORAL_CAPSULE | Freq: Every evening | ORAL | Status: DC | PRN

## 2013-03-30 ENCOUNTER — Telehealth: Payer: Self-pay

## 2013-03-30 NOTE — Telephone Encounter (Signed)
Script for Chlordiazepoxide 25 mg has been faxed to [facility] [phone] for qty 40 plus 5 refills. Take 1 capsule by mouth at bedtime as needed and may repeat dose one time if needed.

## 2013-03-31 ENCOUNTER — Emergency Department (HOSPITAL_BASED_OUTPATIENT_CLINIC_OR_DEPARTMENT_OTHER): Payer: Medicare Other

## 2013-03-31 ENCOUNTER — Telehealth: Payer: Self-pay

## 2013-03-31 ENCOUNTER — Encounter (HOSPITAL_BASED_OUTPATIENT_CLINIC_OR_DEPARTMENT_OTHER): Payer: Self-pay | Admitting: *Deleted

## 2013-03-31 ENCOUNTER — Emergency Department (HOSPITAL_BASED_OUTPATIENT_CLINIC_OR_DEPARTMENT_OTHER)
Admission: EM | Admit: 2013-03-31 | Discharge: 2013-03-31 | Disposition: A | Discharge status: Skilled Nursing Facility | Payer: Medicare Other | Attending: Emergency Medicine | Admitting: Emergency Medicine

## 2013-03-31 DIAGNOSIS — W19XXXA Unspecified fall, initial encounter: Secondary | ICD-10-CM

## 2013-03-31 DIAGNOSIS — Z79899 Other long term (current) drug therapy: Secondary | ICD-10-CM | POA: Insufficient documentation

## 2013-03-31 DIAGNOSIS — Y929 Unspecified place or not applicable: Secondary | ICD-10-CM | POA: Insufficient documentation

## 2013-03-31 DIAGNOSIS — K589 Irritable bowel syndrome without diarrhea: Secondary | ICD-10-CM | POA: Insufficient documentation

## 2013-03-31 DIAGNOSIS — S42202A Unspecified fracture of upper end of left humerus, initial encounter for closed fracture: Secondary | ICD-10-CM

## 2013-03-31 DIAGNOSIS — Y9389 Activity, other specified: Secondary | ICD-10-CM | POA: Insufficient documentation

## 2013-03-31 DIAGNOSIS — H543 Unqualified visual loss, both eyes: Secondary | ICD-10-CM | POA: Insufficient documentation

## 2013-03-31 DIAGNOSIS — S42293A Other displaced fracture of upper end of unspecified humerus, initial encounter for closed fracture: Secondary | ICD-10-CM | POA: Insufficient documentation

## 2013-03-31 DIAGNOSIS — Z8669 Personal history of other diseases of the nervous system and sense organs: Secondary | ICD-10-CM | POA: Insufficient documentation

## 2013-03-31 DIAGNOSIS — Z880 Allergy status to penicillin: Secondary | ICD-10-CM | POA: Insufficient documentation

## 2013-03-31 DIAGNOSIS — Z8719 Personal history of other diseases of the digestive system: Secondary | ICD-10-CM | POA: Insufficient documentation

## 2013-03-31 DIAGNOSIS — K219 Gastro-esophageal reflux disease without esophagitis: Secondary | ICD-10-CM | POA: Insufficient documentation

## 2013-03-31 DIAGNOSIS — J45909 Unspecified asthma, uncomplicated: Secondary | ICD-10-CM | POA: Insufficient documentation

## 2013-03-31 DIAGNOSIS — M810 Age-related osteoporosis without current pathological fracture: Secondary | ICD-10-CM | POA: Insufficient documentation

## 2013-03-31 DIAGNOSIS — W1809XA Striking against other object with subsequent fall, initial encounter: Secondary | ICD-10-CM | POA: Insufficient documentation

## 2013-03-31 DIAGNOSIS — Z8711 Personal history of peptic ulcer disease: Secondary | ICD-10-CM | POA: Insufficient documentation

## 2013-03-31 DIAGNOSIS — S0990XA Unspecified injury of head, initial encounter: Secondary | ICD-10-CM | POA: Insufficient documentation

## 2013-03-31 DIAGNOSIS — Z7982 Long term (current) use of aspirin: Secondary | ICD-10-CM | POA: Insufficient documentation

## 2013-03-31 MED ORDER — TRAMADOL HCL 50 MG PO TABS
50.0000 mg | ORAL_TABLET | Freq: Four times a day (QID) | ORAL | Status: DC | PRN
Start: 2013-03-31 — End: 2013-10-01

## 2013-03-31 MED ORDER — SODIUM CHLORIDE 0.9 % IV SOLN: INTRAVENOUS | Status: DC

## 2013-03-31 NOTE — ED Notes (Signed)
Patient ambulates around RN station without difficulty. 

## 2013-03-31 NOTE — ED Notes (Signed)
Pt undressed.  Pts keys clipped onto pts purse per her request.  Pt taken to radiology

## 2013-03-31 NOTE — Telephone Encounter (Signed)
Phone call from Valatie with [facility] Hydrologist nursing dept). [phone]. She would like you to call directly so she can go over medications patient should or should not be taking.

## 2013-03-31 NOTE — ED Provider Notes (Signed)
History    CSN: [id number] Arrival date & time 03/31/13  1206  First MD Initiated Contact with Patient 03/31/13 1233     Chief Complaint  Patient presents with  . Fall  . Shoulder Pain   (Consider location/radiation/quality/duration/timing/severity/associated sxs/prior Treatment) Patient is a 72 y.o. female presenting with fall and shoulder pain. The history is provided by the patient.  Fall This is a new problem. Pertinent negatives include no chest pain, no abdominal pain, no headaches and no shortness of breath.  Shoulder Pain Pertinent negatives include no chest pain, no abdominal pain, no headaches and no shortness of breath.   patient states that she fell while going to the bathroom last night. States that she hit her head on the bathtub and has pain in her left shoulder and hip. She states she was unable to get up and is laid on the floor since then. No confusion. No loss of consciousness. No bowel pain. No chest pain. No vomiting. No dysuria Past Medical History  Diagnosis Date  . Allergy   . Asthma   . Esophageal reflux   . Dysfunction of eustachian tube   . Irritable bowel syndrome with constipation   . Diverticulosis   . Peptic ulcer disease   . Osteoporosis   . Peripheral neuropathy   . Trigeminal neuralgia     prior injury  . 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 Appendectomy    . Abdominal hysterectomy    . Tonsillectomy    . Carotid endarterectomy     Family History  Problem Relation Age of Onset  . Alzheimer's disease Mother   . Coronary artery disease Father   . Heart attack Father   . Heart disease Father     AAA  . Ovarian cancer Sister   . Lung cancer Brother   . Heart disease Brother     AAA  . Diabetes Neg Hx   . Colon cancer Neg Hx    History  Substance Use Topics  . Smoking status: Never Smoker   . Smokeless tobacco: Not  on file  . Alcohol Use: No   OB History   Grav Para Term Preterm Abortions TAB SAB Ect Mult Living                 Review of Systems  Constitutional: Negative for activity change and appetite change.  HENT: Negative for neck stiffness.   Eyes: Negative for pain.  Respiratory: Negative for chest tightness and shortness of breath.   Cardiovascular: Negative for chest pain and leg swelling.  Gastrointestinal: Negative for nausea, vomiting, abdominal pain and diarrhea.  Genitourinary: Negative for flank pain.  Musculoskeletal: Negative for back pain.       Left shoulder pain left hip pain  Skin: Negative for rash.  Neurological: Negative for weakness, numbness and headaches.  Psychiatric/Behavioral: Negative for behavioral problems.    Allergies  Azithromycin; Clarithromycin; Doxycycline; Geodon; Lipitor; Penicillins; and Sulfonamide derivatives  Home Medications   Current Outpatient Rx  Name  Route  Sig  Dispense  Refill  . albuterol (PROAIR HFA) 108 (90 BASE) MCG/ACT inhaler   Inhalation   Inhale 2 puffs into the lungs 4 (four) times daily as needed for wheezing or shortness of breath.   1 Inhaler   prn   . amitriptyline (ELAVIL) 75 MG tablet   Oral  Take by mouth at bedtime.           Marland Kitchen amLODipine (NORVASC) 5 MG tablet   Oral   Take 1 tablet (5 mg total) by mouth daily.   30 tablet   11   . aspirin 81 MG EC tablet   Oral   Take 81 mg by mouth daily.           . Calcium Carbonate-Vitamin D (CALTRATE 600+D) 600-400 MG-UNIT per tablet   Oral   Take 1 tablet by mouth 2 (two) times daily.           . carboxymethylcellulose (REFRESH) 1 % ophthalmic solution   Ophthalmic   Apply 2 drops to eye 3 (three) times daily.           . chlordiazePOXIDE (LIBRIUM) 25 MG capsule   Oral   Take 1 capsule (25 mg total) by mouth at bedtime and may repeat dose one time if needed.   40 capsule   5   . Cholecalciferol (VITAMIN D3) 1000 UNITS CAPS   Oral   Take 2  capsules by mouth daily.           . Coenzyme Q10 (COQ-10 PO)   Oral   Take 1 tablet by mouth daily.           Marland Kitchen docusate sodium (COLACE) 100 MG capsule   Oral   Take 200 mg by mouth 2 (two) times daily.          Marland Kitchen ezetimibe (ZETIA) 10 MG tablet   Oral   Take 1 tablet (10 mg total) by mouth daily.   30 tablet   11   . famotidine (PEPCID) 20 MG tablet               . fish oil-omega-3 fatty acids 1000 MG capsule   Oral   Take 2 g by mouth daily.          . fluticasone (FLONASE) 50 MCG/ACT nasal spray   Nasal   Place 2 sprays into the nose daily as needed.   16 g   prn   . furosemide (LASIX) 20 MG tablet   Oral   Take 20 mg by mouth every morning. 1 by mouth every am diuretic for peripheral edema          . LORazepam (ATIVAN) 0.5 MG tablet   Oral   Take 0.5 mg by mouth 2 (two) times daily.           . meloxicam (MOBIC) 7.5 MG tablet   Oral   Take 7.5 mg by mouth daily.         . Multiple Vitamins-Minerals (CENTRUM SILVER PO)   Oral   Take 1 tablet by mouth.           . polyethylene glycol powder (GLYCOLAX/MIRALAX) powder   Oral   Take 17 g by mouth as needed.   527 g   1   . thiothixene (NAVANE) 2 MG capsule               . traMADol (ULTRAM) 50 MG tablet   Oral   Take 1 tablet (50 mg total) by mouth every 6 (six) hours as needed for pain.   15 tablet   0    BP 142/77  Pulse 96  Temp(Src) 98.4 F (36.9 C) (Oral)  Resp 18  SpO2 96% Physical Exam  Nursing note and vitals reviewed. Constitutional: She is oriented to person, place,  and time. She appears well-developed and well-nourished.  HENT:  Head: Normocephalic and atraumatic.  Eyes: EOM are normal. Pupils are equal, round, and reactive to light.  Neck: Normal range of motion. Neck supple.  Cardiovascular: Normal rate, regular rhythm and normal heart sounds.   No murmur heard. Pulmonary/Chest: Effort normal and breath sounds normal. No respiratory distress. She has no  wheezes. She has no rales.  Abdominal: Soft. Bowel sounds are normal. She exhibits no distension. There is no tenderness. There is no rebound and no guarding.  Musculoskeletal: Normal range of motion. She exhibits tenderness.  No evidence of trauma on head. No cervical spine tenderness. Tenderness over proximal left humerus. Decreased range of motion. No tenderness of her elbow on left side. Tenderness of her wrist on left side. Neurovascularly intact over hand. Radial pulse intact. Tenderness over left hip laterally. Decreased range of motion leg due to pain. No knee or ankle tenderness. Mild tenderness to right elbow. Decreased range of motion right elbow. No deformity. No tenderness of her right wrist or right shoulder. No right lower extremity tenderness.  Neurological: She is alert and oriented to person, place, and time. No cranial nerve deficit.  Skin: Skin is warm and dry.  Psychiatric: She has a normal mood and affect. Her speech is normal.    ED Course  Procedures (including critical care time) Labs Reviewed - No data to display Dg Elbow Complete Left  03/31/2013   *RADIOLOGY REPORT*  Clinical Data: Left shoulder and left elbow pain, fall  LEFT ELBOW - COMPLETE 3+ VIEW  Comparison: None  Findings: Four views of the left elbow submitted.  No acute fracture or subluxation.  No posterior fat pad sign.  No radiopaque foreign body.  IMPRESSION: No acute fracture or subluxation.   Original Report Authenticated By: Natasha Mead, M.D.   Dg Wrist Complete Left  03/31/2013   *RADIOLOGY REPORT*  Clinical Data: Fall, shoulder pain  LEFT WRIST - COMPLETE 3+ VIEW  Comparison: None.  Findings: Four views of the left wrist submitted.  There is diffuse osteopenia.  No acute fracture or subluxation.  IMPRESSION: Diffuse osteopenia.  No acute fracture or subluxation.   Original Report Authenticated By: Natasha Mead, M.D.   Dg Hip Complete Left  03/31/2013   *RADIOLOGY REPORT*  Clinical Data: Pain post fall, left  shoulder pain  LEFT HIP - COMPLETE 2+ VIEW  Comparison: None.  Findings: Three views of the left hip submitted.  There is diffuse osteopenia.  No acute fracture or subluxation.  Mild degenerative changes bilateral hip joints with narrowing superior joint space.  IMPRESSION: No acute fracture or subluxation.  Mild degenerative changes bilateral hip joints.   Original Report Authenticated By: Natasha Mead, M.D.   Ct Head Wo Contrast  03/31/2013   *RADIOLOGY REPORT*  Clinical Data:  Fall last night.  Headache.  Shoulder pain.  CT HEAD WITHOUT CONTRAST CT CERVICAL SPINE WITHOUT CONTRAST  Technique:  Multidetector CT imaging of the head and cervical spine was performed following the standard protocol without intravenous contrast.  Multiplanar CT image reconstructions of the cervical spine were also generated.  Comparison:  CT head without contrast 12/30/2010.  CT HEAD  Findings: Soft tissue swelling is present over the high left parietal scalp without an underlying fracture.  The paranasal sinuses and mastoid air cells are clear.  The osseous skull is intact.  No acute cortical infarct, hemorrhage, or mass lesion is present. The ventricles are of normal size.  No significant extra-axial fluid  collection is present.  IMPRESSION:  1.  High left parietal scalp soft tissue swelling/hematoma. 2.  Normal CT appearance of the brain. 3.  No underlying skull fracture.  CT CERVICAL SPINE  Findings: The cervical spine is imaged from skull base through T1- 2.  The focal chronic end plate degenerative change uncovertebral spurring is present at C5-6.  There is slight degenerative retrolisthesis at C5-6.  Minimal degenerative changes are noted at C4-5.  No acute fracture or traumatic subluxation is evident.  Carotid artery calcifications are present bilaterally.  The soft tissues of the neck are otherwise within normal limits.  The lung apices are clear.  IMPRESSION:  1.  Mild degenerative changes of the cervical spine are most evident  at C5-6. 2.  No acute abnormality.   Original Report Authenticated By: Marin Roberts, M.D.   Ct Cervical Spine Wo Contrast  03/31/2013   *RADIOLOGY REPORT*  Clinical Data:  Fall last night.  Headache.  Shoulder pain.  CT HEAD WITHOUT CONTRAST CT CERVICAL SPINE WITHOUT CONTRAST  Technique:  Multidetector CT imaging of the head and cervical spine was performed following the standard protocol without intravenous contrast.  Multiplanar CT image reconstructions of the cervical spine were also generated.  Comparison:  CT head without contrast 12/30/2010.  CT HEAD  Findings: Soft tissue swelling is present over the high left parietal scalp without an underlying fracture.  The paranasal sinuses and mastoid air cells are clear.  The osseous skull is intact.  No acute cortical infarct, hemorrhage, or mass lesion is present. The ventricles are of normal size.  No significant extra-axial fluid collection is present.  IMPRESSION:  1.  High left parietal scalp soft tissue swelling/hematoma. 2.  Normal CT appearance of the brain. 3.  No underlying skull fracture.  CT CERVICAL SPINE  Findings: The cervical spine is imaged from skull base through T1- 2.  The focal chronic end plate degenerative change uncovertebral spurring is present at C5-6.  There is slight degenerative retrolisthesis at C5-6.  Minimal degenerative changes are noted at C4-5.  No acute fracture or traumatic subluxation is evident.  Carotid artery calcifications are present bilaterally.  The soft tissues of the neck are otherwise within normal limits.  The lung apices are clear.  IMPRESSION:  1.  Mild degenerative changes of the cervical spine are most evident at C5-6. 2.  No acute abnormality.   Original Report Authenticated By: Marin Roberts, M.D.   Dg Shoulder Left  03/31/2013   *RADIOLOGY REPORT*  Clinical Data: Pain post fall  LEFT SHOULDER - 2+ VIEW  Comparison: None.  Findings: Two views of the left shoulder submitted.  There is mild  displaced subcapital fracture of proximal left humerus.  IMPRESSION: Mild displaced subcapital fracture of proximal left humerus.   Original Report Authenticated By: Natasha Mead, M.D.   1. Fall, initial encounter   2. Fracture of proximal humerus, left, closed, initial encounter     MDM  Patient with fall. Proximal humerus fracture. Some hip pain, however she's been able and weight has a negative x-ray. Will be discharged back to her assisted-living/independent living. Discussed with her son also  Juliet Rude. Rubin Payor, MD 03/31/13 [phone]

## 2013-03-31 NOTE — ED Notes (Signed)
Per EMS:  [facility]  States that pt fell last night at 1230am states that she was found this morning by staff.  Pt reports (L) shoulder pain.  No deformity, swelling or bruising noted.  Denies neck or back pain

## 2013-04-01 NOTE — Telephone Encounter (Signed)
Spoke to someone at [facility] [phone]. A few meds were mentioned that I do not show on her med list we have. I was told her Co enzyme was d/c yesterday. Should she be taking Tylenol 325 mg qhs, thiothixene 2 mg BID/4 mg QHS, Dulcolax suppositories prn and also Dulcolax 5 mg po, Fleet enema prn, Milk of Mag take 30 ml po. Please advise thanks

## 2013-04-01 NOTE — Telephone Encounter (Signed)
Left message to return call 

## 2013-04-01 NOTE — Telephone Encounter (Signed)
Please call for me. Her med list there should match our med list. For any variances please note and I will address later.  Thank you

## 2013-04-01 NOTE — Telephone Encounter (Signed)
1. Thiothixene was on her list but evidently has been increased by psychiatry and this is ok.  2. Tylenol at bedtime is ok. 3. All the prn's for constipation - dulcolax, fleets, MOM are ok   Thanks for your help with this.

## 2013-04-02 ENCOUNTER — Non-Acute Institutional Stay (SKILLED_NURSING_FACILITY): Payer: Medicare Other | Admitting: Nurse Practitioner

## 2013-04-02 DIAGNOSIS — S42201A Unspecified fracture of upper end of right humerus, initial encounter for closed fracture: Secondary | ICD-10-CM | POA: Insufficient documentation

## 2013-04-02 DIAGNOSIS — K5900 Constipation, unspecified: Secondary | ICD-10-CM

## 2013-04-02 DIAGNOSIS — S42209A Unspecified fracture of upper end of unspecified humerus, initial encounter for closed fracture: Secondary | ICD-10-CM | POA: Insufficient documentation

## 2013-04-02 DIAGNOSIS — F411 Generalized anxiety disorder: Secondary | ICD-10-CM

## 2013-04-02 DIAGNOSIS — K5909 Other constipation: Secondary | ICD-10-CM

## 2013-04-02 DIAGNOSIS — E785 Hyperlipidemia, unspecified: Secondary | ICD-10-CM

## 2013-04-02 DIAGNOSIS — R609 Edema, unspecified: Secondary | ICD-10-CM

## 2013-04-02 DIAGNOSIS — I10 Essential (primary) hypertension: Secondary | ICD-10-CM

## 2013-04-02 NOTE — Assessment & Plan Note (Signed)
Controlled w/o antihypertensive agent.   

## 2013-04-02 NOTE — Assessment & Plan Note (Signed)
Not apparent edema seen

## 2013-04-02 NOTE — Progress Notes (Signed)
Patient ID: Jean [name], female   DOB: 02-18-1941, 72 y.o.   MRN: [id number] Code Status: Full Code  Allergies  Allergen Reactions  . Azithromycin   . Clarithromycin   . Doxycycline   . Geodon (Ziprasidone Hydrochloride)   . Lipitor (Atorvastatin Calcium)   . Penicillins   . Sulfonamide Derivatives     Chief Complaint  Patient presents with  . Medical Managment of Chronic Issues    s/p fall, left humerus fx.     HPI: Patient is a 72 y.o. female seen in the SNF at [facility] today for evaluation of left humerus fx,  Problem List Items Addressed This Visit   ANXIETY     With psychotic features, f/u psychiatry regularly, takes Lorazepam 05mg  bid, Thiothixene 2mg  bid, Amitriptyline 50mg  hs, and Chlordiazepoxide 25mg  hs. Will check CBC, CMP, Hgb A1c, lipid panel, and TSH. Psych hospitalization 09/20/10-10/06/10 and 10/11/10-11/10/10 at [facility]. Staff stated she has been observed crying, paranoia, and obsession behaviors.      Chronic constipation - Primary     Takes Colace 200mg  bid, prn-Dulcolax suppository I daily, Bisacodyl 5mg  po daily, Feet 4.5Oz PR daily, MOM 30mg  po daily, and MiraLax daily. The patient stated she has no had BM for a few days-will have prn laxative administered and observe the patient.     Closed fracture of unspecified part of upper end of humerus     Tylenol 650mg  hs and Meloxicam 7.5mg  daily for pain. F/u Ortho. SNF for care needs for now. Tramadol prn. ED evaluation 03/31/13 after she was found on floor in her apartment by staff at [facility]. She stated she slid and hit her left shoulder on bathtub in her bathroom around 12am and stayed on floor until next noon. Sling to support the left shoulder/arm--f/u Ortho next week.     DYSLIPIDEMIA     Takes Zetia, check lipid panel.     PERIPHERAL EDEMA     Not apparent edema seen    Unspecified essential hypertension     Controlled w/o antihypertensive agent       Review of Systems:  Review of Systems   Constitutional: Positive for malaise/fatigue. Negative for fever, chills, weight loss and diaphoresis.  HENT: Positive for hearing loss. Negative for ear pain, nosebleeds, congestion, sore throat, neck pain, tinnitus and ear discharge.   Eyes: Negative for blurred vision, double vision, photophobia, pain, discharge and redness.  Respiratory: Negative for cough, hemoptysis, sputum production, shortness of breath, wheezing and stridor.   Cardiovascular: Positive for leg swelling. Negative for chest pain, palpitations, orthopnea, claudication and PND.  Gastrointestinal: Positive for constipation. Negative for heartburn, nausea, vomiting, abdominal pain, diarrhea, blood in stool and melena.  Genitourinary: Positive for frequency (adult depends). Negative for dysuria, urgency and flank pain.  Musculoskeletal: Positive for joint pain (left shoudler) and falls. Negative for myalgias and back pain.  Skin: Negative for itching and rash.  Neurological: Positive for tremors (diffused rigidity. ) and weakness (generalized). Negative for dizziness, tingling, sensory change, speech change, focal weakness, seizures, loss of consciousness and headaches.  Endo/Heme/Allergies: Negative for environmental allergies and polydipsia. Does not bruise/bleed easily.  Psychiatric/Behavioral: Positive for depression and memory loss. Negative for suicidal ideas, hallucinations and substance abuse. The patient is nervous/anxious. The patient does not have insomnia.   Marland Kitchenros   Past Medical History  Diagnosis Date  . Allergy   . Asthma   . Esophageal reflux   . Dysfunction of eustachian tube   . Irritable bowel syndrome  with constipation   . Diverticulosis   . Peptic ulcer disease   . Osteoporosis   . Peripheral neuropathy   . Trigeminal neuralgia     prior injury  . 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 Appendectomy    . Abdominal hysterectomy    . Tonsillectomy    . Carotid endarterectomy     Social History:   reports that she has never smoked. She does not have any smokeless tobacco history on file. She reports that she does not drink alcohol or use illicit drugs.  Family History  Problem Relation Age of Onset  . Alzheimer's disease Mother   . Coronary artery disease Father   . Heart attack Father   . Heart disease Father     AAA  . Ovarian cancer Sister   . Lung cancer Brother   . Heart disease Brother     AAA  . Diabetes Neg Hx   . Colon cancer Neg Hx     Medications: Patient's Medications  New Prescriptions   No medications on file  Previous Medications   ALBUTEROL (PROAIR HFA) 108 (90 BASE) MCG/ACT INHALER    Inhale 2 puffs into the lungs 4 (four) times daily as needed for wheezing or shortness of breath.   AMITRIPTYLINE (ELAVIL) 75 MG TABLET    Take by mouth at bedtime.     AMLODIPINE (NORVASC) 5 MG TABLET    Take 1 tablet (5 mg total) by mouth daily.   ASPIRIN 81 MG EC TABLET    Take 81 mg by mouth daily.     CALCIUM CARBONATE-VITAMIN D (CALTRATE 600+D) 600-400 MG-UNIT PER TABLET    Take 1 tablet by mouth 2 (two) times daily.     CARBOXYMETHYLCELLULOSE (REFRESH) 1 % OPHTHALMIC SOLUTION    Apply 2 drops to eye 3 (three) times daily.     CHLORDIAZEPOXIDE (LIBRIUM) 25 MG CAPSULE    Take 1 capsule (25 mg total) by mouth at bedtime and may repeat dose one time if needed.   CHOLECALCIFEROL (VITAMIN D3) 1000 UNITS CAPS    Take 2 capsules by mouth daily.     COENZYME Q10 (COQ-10 PO)    Take 1 tablet by mouth daily.     DOCUSATE SODIUM (COLACE) 100 MG CAPSULE    Take 200 mg by mouth 2 (two) times daily.    EZETIMIBE (ZETIA) 10 MG TABLET    Take 1 tablet (10 mg total) by mouth daily.   FAMOTIDINE (PEPCID) 20 MG TABLET       FISH OIL-OMEGA-3 FATTY ACIDS 1000 MG CAPSULE    Take 2 g by mouth daily.    FLUTICASONE (FLONASE) 50 MCG/ACT NASAL SPRAY    Place 2 sprays into the  nose daily as needed.   FUROSEMIDE (LASIX) 20 MG TABLET    Take 20 mg by mouth every morning. 1 by mouth every am diuretic for peripheral edema    LORAZEPAM (ATIVAN) 0.5 MG TABLET    Take 0.5 mg by mouth 2 (two) times daily.     MELOXICAM (MOBIC) 7.5 MG TABLET    Take 7.5 mg by mouth daily.   MULTIPLE VITAMINS-MINERALS (CENTRUM SILVER PO)    Take 1 tablet by mouth.     POLYETHYLENE GLYCOL POWDER (GLYCOLAX/MIRALAX) POWDER    Take 17 g by mouth as needed.   THIOTHIXENE (NAVANE) 2 MG  CAPSULE       TRAMADOL (ULTRAM) 50 MG TABLET    Take 1 tablet (50 mg total) by mouth every 6 (six) hours as needed for pain.  Modified Medications   No medications on file  Discontinued Medications   No medications on file     Physical Exam: Physical Exam  Constitutional: She is oriented to person, place, and time. She appears well-developed and well-nourished. No distress.  HENT:  Head: Normocephalic and atraumatic.  Right Ear: External ear normal.  Left Ear: External ear normal.  Eyes: Conjunctivae and EOM are normal. Pupils are equal, round, and reactive to light.  Neck: Neck supple.  Cardiovascular: Normal rate, regular rhythm and normal heart sounds.   Pulmonary/Chest: Effort normal and breath sounds normal. No respiratory distress. She has no rales. She exhibits no tenderness.  Abdominal: Soft. Bowel sounds are normal.  Musculoskeletal: She exhibits no edema and no tenderness.       Left shoulder: She exhibits decreased range of motion, tenderness, bony tenderness, swelling and pain. She exhibits no effusion, no crepitus, no deformity, no laceration, no spasm, normal pulse and normal strength.  Left proximal humerus fx.   Neurological: She is alert and oriented to person, place, and time. She has normal reflexes. No cranial nerve deficit.  Skin: Skin is warm and dry.  Psychiatric: Her mood appears not anxious. Her affect is not angry, not blunt, not labile and not inappropriate. Her speech is not rapid  and/or pressured, not delayed, not tangential and not slurred. She is slowed. She is not agitated, not hyperactive, not withdrawn, not actively hallucinating and not combative. Cognition and memory are impaired. She exhibits a depressed mood. She is communicative. She exhibits abnormal recent memory.  A little anxious/flat affect/slow in responding, no frank hallucinosis.  She is attentive.    Filed Vitals:   04/02/13 1526  BP: 130/87  Pulse: 90  Temp: 98.4 F (36.9 C)  TempSrc: Tympanic  Resp: 20      Labs reviewed: Basic Metabolic Panel: No results found for this basename: NA, K, CL, CO2, GLUCOSE, BUN, CREATININE, CALCIUM, MG, PHOS, TSH,  in the last 8760 hours Liver Function Tests: No results found for this basename: AST, ALT, ALKPHOS, BILITOT, PROT, ALBUMIN,  in the last 8760 hours No results found for this basename: LIPASE, AMYLASE,  in the last 8760 hours No results found for this basename: AMMONIA,  in the last 8760 hours CBC: No results found for this basename: WBC, NEUTROABS, HGB, HCT, MCV, PLT,  in the last 8760 hours Lipid Panel: No results found for this basename: CHOL, HDL, LDLCALC, TRIG, CHOLHDL, LDLDIRECT,  in the last 8760 hours Anemia Panel: No results found for this basename: FOLATE, IRON, VITAMINB12,  in the last 8760 hours  Past Procedures: 03/31/13 X-ray L wrist IMPRESSION: Diffuse osteopenia.  No acute fracture or subluxation  03/31/13 X-ray L shoulder IMPRESSION: Mild displaced subcapital fracture of proximal left humerus.  03/31/13 X-ray L hip IMPRESSION: No acute fracture or subluxation.  Mild degenerative changes bilateral hip joints.  03/31/13 X-ray L elbow IMPRESSION: No acute fracture or subluxation  03/31/13 CT w/o CM  C-spine IMPRESSION:   1.  Mild degenerative changes of the cervical spine are most evident at C5-6. 2.  No acute abnormality.  03/31/13 CT w/o CM head IMPRESSION:   1.  High left parietal scalp soft tissue  swelling/hematoma. 2.  Normal CT appearance of the brain. 3.  No underlying skull fracture.  Assessment/Plan ANXIETY With psychotic features, f/u psychiatry regularly, takes Lorazepam 05mg  bid, Thiothixene 2mg  bid, Amitriptyline 50mg  hs, and Chlordiazepoxide 25mg  hs. Will check CBC, CMP, Hgb A1c, lipid panel, and TSH. Psych hospitalization 09/20/10-10/06/10 and 10/11/10-11/10/10 at [facility]. Staff stated she has been observed crying, paranoia, and obsession behaviors.    Closed fracture of unspecified part of upper end of humerus Tylenol 650mg  hs and Meloxicam 7.5mg  daily for pain. F/u Ortho. SNF for care needs for now. Tramadol prn. ED evaluation 03/31/13 after she was found on floor in her apartment by staff at [facility]. She stated she slid and hit her left shoulder on bathtub in her bathroom around 12am and stayed on floor until next noon. Sling to support the left shoulder/arm--f/u Ortho next week.   Chronic constipation Takes Colace 200mg  bid, prn-Dulcolax suppository I daily, Bisacodyl 5mg  po daily, Feet 4.5Oz PR daily, MOM 30mg  po daily, and MiraLax daily. The patient stated she has no had BM for a few days-will have prn laxative administered and observe the patient.   Unspecified essential hypertension Controlled w/o antihypertensive agent  DYSLIPIDEMIA Takes Zetia, check lipid panel.   PERIPHERAL EDEMA Not apparent edema seen    Family/ Staff Communication: observe the patient and provide assistance with ADL  Goals of Care: IL when able to   Labs/tests ordered: CBC, CMP, TSH, lipid, Hgb A1c

## 2013-04-02 NOTE — Assessment & Plan Note (Addendum)
Tylenol 650mg  hs and Meloxicam 7.5mg  daily for pain. F/u Ortho. SNF for care needs for now. Tramadol prn. ED evaluation 03/31/13 after she was found on floor in her apartment by staff at [facility]. She stated she slid and hit her left shoulder on bathtub in her bathroom around 12am and stayed on floor until next noon. Sling to support the left shoulder/arm--f/u Ortho next week.

## 2013-04-02 NOTE — Assessment & Plan Note (Addendum)
With psychotic features, f/u psychiatry regularly, takes Lorazepam 05mg  bid, Thiothixene 2mg  bid, Amitriptyline 50mg  hs, and Chlordiazepoxide 25mg  hs. Will check CBC, CMP, Hgb A1c, lipid panel, and TSH. Psych hospitalization 09/20/10-10/06/10 and 10/11/10-11/10/10 at [facility]. Staff stated she has been observed crying, paranoia, and obsession behaviors.

## 2013-04-02 NOTE — Assessment & Plan Note (Addendum)
Takes Colace 200mg bid, prn-Dulcolax suppository I daily, Bisacodyl 5mg po daily, Feet 4.5Oz PR daily, MOM 30mg po daily, and MiraLax daily. The patient stated she has no had BM for a few days-will have prn laxative administered and observe the patient.    

## 2013-04-02 NOTE — Assessment & Plan Note (Signed)
Takes Zetia, check lipid panel.

## 2013-04-04 LAB — HEPATIC FUNCTION PANEL
AST: 99 U/L — AB (ref 13–35)
Bilirubin, Total: 0.3 mg/dL

## 2013-04-04 LAB — BASIC METABOLIC PANEL
BUN: 24 mg/dL — AB (ref 4–21)
Creatinine: 1 mg/dL (ref 0.5–1.1)
Potassium: 3.5 mmol/L (ref 3.4–5.3)
Sodium: 137 mmol/L (ref 137–147)

## 2013-04-04 LAB — LIPID PANEL: Triglycerides: 108 mg/dL (ref 40–160)

## 2013-04-04 LAB — TSH: TSH: 1.37 u[IU]/mL (ref 0.41–5.90)

## 2013-04-06 NOTE — Telephone Encounter (Signed)
Kathie Rhodes notified med list is fine.

## 2013-04-13 ENCOUNTER — Non-Acute Institutional Stay (SKILLED_NURSING_FACILITY): Payer: Medicare Other | Admitting: Nurse Practitioner

## 2013-04-13 ENCOUNTER — Encounter: Payer: Self-pay | Admitting: Nurse Practitioner

## 2013-04-13 DIAGNOSIS — I10 Essential (primary) hypertension: Secondary | ICD-10-CM

## 2013-04-13 DIAGNOSIS — K219 Gastro-esophageal reflux disease without esophagitis: Secondary | ICD-10-CM

## 2013-04-13 DIAGNOSIS — E785 Hyperlipidemia, unspecified: Secondary | ICD-10-CM

## 2013-04-13 DIAGNOSIS — N318 Other neuromuscular dysfunction of bladder: Secondary | ICD-10-CM

## 2013-04-13 DIAGNOSIS — R748 Abnormal levels of other serum enzymes: Secondary | ICD-10-CM

## 2013-04-13 DIAGNOSIS — R609 Edema, unspecified: Secondary | ICD-10-CM

## 2013-04-13 NOTE — Assessment & Plan Note (Signed)
Controlled w/o antihypertensive agent.   

## 2013-04-13 NOTE — Progress Notes (Signed)
Patient ID: Jean [name], female   DOB: 09-May-1941, 72 y.o.   MRN: [id number] Code Status: Full Code  Allergies  Allergen Reactions  . Azithromycin   . Clarithromycin   . Doxycycline   . Geodon (Ziprasidone Hydrochloride)   . Lipitor (Atorvastatin Calcium)   . Penicillins   . Sulfonamide Derivatives     Chief Complaint  Patient presents with  . Medical Managment of Chronic Issues    urinary frequency.     HPI: Patient is a 72 y.o. female seen in the SNF  at [facility] today for urinary frequency and other chronic medical conditions.  Problem List Items Addressed This Visit   DYSLIPIDEMIA     LDL 80 04/04/13, takes Zetia.Hold Zetia 2nd to elevated LFT.     GERD (gastroesophageal reflux disease)     Stabilized on Famotidine 20mg  daily.     Hypertonicity of bladder     Will obtain UA C/S in am to rule out UTI, may consider Myrbetriq 25mg  daily.     Liver enzyme elevation - Primary     Will change routine Tylenol and Meloxicam to prn, repeat CMP in one week.     PERIPHERAL EDEMA     Trace on Furosemide 20mg  daily.     Unspecified essential hypertension     Controlled w/o antihypertensive agent         Review of Systems:  Review of Systems  Constitutional: Negative for fever, chills, weight loss, malaise/fatigue and diaphoresis.  HENT: Positive for hearing loss. Negative for ear pain, nosebleeds, congestion, sore throat, neck pain, tinnitus and ear discharge.   Eyes: Negative for blurred vision, double vision, photophobia, pain, discharge and redness.  Respiratory: Negative for cough, hemoptysis, sputum production, shortness of breath, wheezing and stridor.   Cardiovascular: Positive for leg swelling. Negative for chest pain, palpitations, orthopnea, claudication and PND.  Gastrointestinal: Positive for constipation. Negative for heartburn, nausea, vomiting, abdominal pain, diarrhea, blood in stool and melena.  Genitourinary: Positive for frequency (adult  depends). Negative for dysuria, urgency and flank pain.  Musculoskeletal: Positive for joint pain (left shoudler) and falls. Negative for myalgias and back pain.  Skin: Negative for itching and rash.  Neurological: Positive for tremors (diffused rigidity. ) and weakness (generalized). Negative for dizziness, tingling, sensory change, speech change, focal weakness, seizures, loss of consciousness and headaches.  Endo/Heme/Allergies: Negative for environmental allergies and polydipsia. Does not bruise/bleed easily.  Psychiatric/Behavioral: Positive for depression and memory loss. Negative for suicidal ideas, hallucinations and substance abuse. The patient is nervous/anxious. The patient does not have insomnia.      Past Medical History  Diagnosis Date  . Allergy   . Asthma   . Esophageal reflux   . Dysfunction of eustachian tube   . Irritable bowel syndrome with constipation   . Diverticulosis   . Peptic ulcer disease   . Osteoporosis   . Peripheral neuropathy   . Trigeminal neuralgia     prior injury  . 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 Appendectomy    . Abdominal hysterectomy    . Tonsillectomy    . Carotid endarterectomy     Social History:   reports that she has never smoked. She does not have any smokeless tobacco history on file. She reports that she does not drink alcohol or use illicit drugs.  Family History  Problem  Relation Age of Onset  . Alzheimer's disease Mother   . Coronary artery disease Father   . Heart attack Father   . Heart disease Father     AAA  . Ovarian cancer Sister   . Lung cancer Brother   . Heart disease Brother     AAA  . Diabetes Neg Hx   . Colon cancer Neg Hx     Medications: Patient's Medications  New Prescriptions   No medications on file  Previous Medications   ALBUTEROL (PROAIR HFA) 108 (90 BASE) MCG/ACT INHALER     Inhale 2 puffs into the lungs 4 (four) times daily as needed for wheezing or shortness of breath.   AMITRIPTYLINE (ELAVIL) 75 MG TABLET    Take by mouth at bedtime.     AMLODIPINE (NORVASC) 5 MG TABLET    Take 1 tablet (5 mg total) by mouth daily.   ASPIRIN 81 MG EC TABLET    Take 81 mg by mouth daily.     CALCIUM CARBONATE-VITAMIN D (CALTRATE 600+D) 600-400 MG-UNIT PER TABLET    Take 1 tablet by mouth 2 (two) times daily.     CARBOXYMETHYLCELLULOSE (REFRESH) 1 % OPHTHALMIC SOLUTION    Apply 2 drops to eye 3 (three) times daily.     CHLORDIAZEPOXIDE (LIBRIUM) 25 MG CAPSULE    Take 1 capsule (25 mg total) by mouth at bedtime and may repeat dose one time if needed.   CHOLECALCIFEROL (VITAMIN D3) 1000 UNITS CAPS    Take 2 capsules by mouth daily.     COENZYME Q10 (COQ-10 PO)    Take 1 tablet by mouth daily.     DOCUSATE SODIUM (COLACE) 100 MG CAPSULE    Take 200 mg by mouth 2 (two) times daily.    EZETIMIBE (ZETIA) 10 MG TABLET    Take 1 tablet (10 mg total) by mouth daily.   FAMOTIDINE (PEPCID) 20 MG TABLET       FISH OIL-OMEGA-3 FATTY ACIDS 1000 MG CAPSULE    Take 2 g by mouth daily.    FLUTICASONE (FLONASE) 50 MCG/ACT NASAL SPRAY    Place 2 sprays into the nose daily as needed.   FUROSEMIDE (LASIX) 20 MG TABLET    Take 20 mg by mouth every morning. 1 by mouth every am diuretic for peripheral edema    LORAZEPAM (ATIVAN) 0.5 MG TABLET    Take 0.5 mg by mouth 2 (two) times daily.     MELOXICAM (MOBIC) 7.5 MG TABLET    Take 7.5 mg by mouth daily.   MULTIPLE VITAMINS-MINERALS (CENTRUM SILVER PO)    Take 1 tablet by mouth.     POLYETHYLENE GLYCOL POWDER (GLYCOLAX/MIRALAX) POWDER    Take 17 g by mouth as needed.   THIOTHIXENE (NAVANE) 2 MG CAPSULE       TRAMADOL (ULTRAM) 50 MG TABLET    Take 1 tablet (50 mg total) by mouth every 6 (six) hours as needed for pain.  Modified Medications   No medications on file  Discontinued Medications   No medications on file     Physical Exam: Physical Exam   Constitutional: She is oriented to person, place, and time. She appears well-developed and well-nourished. No distress.  HENT:  Head: Normocephalic and atraumatic.  Right Ear: External ear normal.  Left Ear: External ear normal.  Eyes: Conjunctivae and EOM are normal. Pupils are equal, round, and reactive to light.  Neck: Neck supple.  Cardiovascular: Normal rate, regular rhythm and normal heart sounds.  Pulmonary/Chest: Effort normal and breath sounds normal. No respiratory distress. She has no rales. She exhibits no tenderness.  Abdominal: Soft. Bowel sounds are normal.  Musculoskeletal: She exhibits edema (trace). She exhibits no tenderness.       Left shoulder: She exhibits decreased range of motion, tenderness, bony tenderness, swelling and pain. She exhibits no effusion, no crepitus, no deformity, no laceration, no spasm, normal pulse and normal strength.  Left proximal humerus fx.   Neurological: She is alert and oriented to person, place, and time. She has normal reflexes. No cranial nerve deficit.  Skin: Skin is warm and dry.  Psychiatric: Her mood appears not anxious. Her affect is not angry, not blunt, not labile and not inappropriate. Her speech is not rapid and/or pressured, not delayed, not tangential and not slurred. She is slowed. She is not agitated, not hyperactive, not withdrawn, not actively hallucinating and not combative. Cognition and memory are impaired. She exhibits a depressed mood. She is communicative. She exhibits abnormal recent memory.  A little anxious/flat affect/slow in responding, no frank hallucinosis.  She is attentive.    Filed Vitals:   04/13/13 1418  BP: 130/80  Pulse: 80  Temp: 98.9 F (37.2 C)  TempSrc: Tympanic  Resp: 18      Labs reviewed: Basic Metabolic Panel:  Recent Labs  16/10/96  NA 137  K 3.5  BUN 24*  CREATININE 1.0  TSH 1.37   Liver Function Tests:  Recent Labs  04/04/13  AST 99*  ALT 97*  ALKPHOS 125   No  results found for this basename: LIPASE, AMYLASE,  in the last 8760 hours No results found for this basename: AMMONIA,  in the last 8760 hours CBC:  Recent Labs  04/04/13  WBC 10.2  HGB 10.1*  HCT 30*   Lipid Panel:  Recent Labs  04/04/13  CHOL 155  HDL 53  LDLCALC 80  TRIG 108   Anemia Panel: No results found for this basename: FOLATE, IRON, VITAMINB12,  in the last 8760 hours  Past Procedures:     Assessment/Plan Liver enzyme elevation Will change routine Tylenol and Meloxicam to prn, repeat CMP in one week.   Hypertonicity of bladder Will obtain UA C/S in am to rule out UTI, may consider Myrbetriq 25mg  daily.   GERD (gastroesophageal reflux disease) Stabilized on Famotidine 20mg  daily.   PERIPHERAL EDEMA Trace on Furosemide 20mg  daily.   Unspecified essential hypertension Controlled w/o antihypertensive agent    DYSLIPIDEMIA LDL 80 04/04/13, takes Zetia.Hold Zetia 2nd to elevated LFT.     Family/ Staff Communication:   Goals of Care:  Labs/tests ordered:

## 2013-04-13 NOTE — Assessment & Plan Note (Signed)
LDL 80 04/04/13, takes Zetia.Hold Zetia 2nd to elevated LFT.

## 2013-04-13 NOTE — Assessment & Plan Note (Signed)
Stabilized on Famotidine 20mg  daily.

## 2013-04-13 NOTE — Assessment & Plan Note (Addendum)
Will change routine Tylenol and Meloxicam to prn, repeat CMP in one week. Also will obtain US liver, gallbladder, pancrease, lipase, amylase, hepatitis panel.

## 2013-04-13 NOTE — Assessment & Plan Note (Signed)
Will obtain UA C/S in am to rule out UTI, may consider Myrbetriq 25mg  daily.

## 2013-04-13 NOTE — Assessment & Plan Note (Signed)
Trace on Furosemide 20mg  daily.

## 2013-04-21 LAB — HEPATIC FUNCTION PANEL: Bilirubin, Total: 0.3 mg/dL

## 2013-04-21 LAB — BASIC METABOLIC PANEL
BUN: 21 mg/dL (ref 4–21)
Creatinine: 0.8 mg/dL (ref 0.5–1.1)

## 2013-05-06 ENCOUNTER — Non-Acute Institutional Stay (SKILLED_NURSING_FACILITY): Payer: Medicare Other | Admitting: Nurse Practitioner

## 2013-05-06 DIAGNOSIS — F411 Generalized anxiety disorder: Secondary | ICD-10-CM

## 2013-05-06 DIAGNOSIS — R1084 Generalized abdominal pain: Secondary | ICD-10-CM

## 2013-05-06 DIAGNOSIS — K219 Gastro-esophageal reflux disease without esophagitis: Secondary | ICD-10-CM

## 2013-05-06 DIAGNOSIS — I10 Essential (primary) hypertension: Secondary | ICD-10-CM

## 2013-05-06 DIAGNOSIS — R748 Abnormal levels of other serum enzymes: Secondary | ICD-10-CM

## 2013-05-06 DIAGNOSIS — S42209A Unspecified fracture of upper end of unspecified humerus, initial encounter for closed fracture: Secondary | ICD-10-CM

## 2013-05-06 DIAGNOSIS — N318 Other neuromuscular dysfunction of bladder: Secondary | ICD-10-CM

## 2013-05-06 DIAGNOSIS — R609 Edema, unspecified: Secondary | ICD-10-CM

## 2013-05-07 ENCOUNTER — Encounter: Payer: Self-pay | Admitting: Nurse Practitioner

## 2013-05-07 NOTE — Assessment & Plan Note (Signed)
Seems better with  Myrbetriq 25mg daily.    

## 2013-05-07 NOTE — Assessment & Plan Note (Signed)
Resolved, 04/15/13 abd US unremarkable. 04/16/13 Amylase 19, Lipase <10.

## 2013-05-07 NOTE — Assessment & Plan Note (Signed)
Stabilized on Famotidine 20mg daily.  

## 2013-05-07 NOTE — Assessment & Plan Note (Signed)
With psychotic features, f/u psychiatry regularly, takes Lorazepam 05mg  bid, Thiothixene 2mg  am and 4mg  since 05/04/13 per Psychiatry, Amitriptyline 50mg  hs, and Chlordiazepoxide 25mg  hs Psych hospitalization 09/20/10-10/06/10 and 10/11/10-11/10/10 at [facility]. Staff stated she has been observed crying, paranoia, and obsession behaviors-too soon to eval since Thiothixene was increased 05/04/13

## 2013-05-07 NOTE — Progress Notes (Signed)
Patient ID: Jean [name], female   DOB: 1941/05/30, 72 y.o.   MRN: [id number] Code Status: DNR  Allergies  Allergen Reactions  . Azithromycin   . Clarithromycin   . Doxycycline   . Geodon [Ziprasidone Hydrochloride]   . Lipitor [Atorvastatin Calcium]   . Penicillins   . Sulfonamide Derivatives     Chief Complaint  Patient presents with  . Medical Managment of Chronic Issues    HPI: Patient is a 72 y.o. female seen in the SNF at [facility] today for evaluation of  Left arm pain and other chronic medical conditions.  Problem List Items Addressed This Visit   Abdominal pain, generalized - Primary     Resolved, 04/15/13 abd US unremarkable. 04/16/13 Amylase 19, Lipase <10.    ANXIETY     With psychotic features, f/u psychiatry regularly, takes Lorazepam 05mg  bid, Thiothixene 2mg  am and 4mg  since 05/04/13 per Psychiatry, Amitriptyline 50mg  hs, and Chlordiazepoxide 25mg  hs Psych hospitalization 09/20/10-10/06/10 and 10/11/10-11/10/10 at [facility]. Staff stated she has been observed crying, paranoia, and obsession behaviors-too soon to eval since Thiothixene was increased 05/04/13      Closed fracture of unspecified part of upper end of humerus     Saw Ortho 05/01/13--PT/OT full PROM L shoulder, no active motion yet and ok to dc sling and use arm at waist level w/o lifting, f/u 4 wks.     GERD (gastroesophageal reflux disease)     Stabilized on Famotidine 20mg  daily.       Hypertonicity of bladder     Seems better with  Myrbetriq 25mg  daily.       Liver enzyme elevation     Normalized since Tylenol prn and Mobic dc'd. Lipase, Amylase, abd US unremarkable.       PERIPHERAL EDEMA     Trace BLE. Takes Furosemide 20mg     Unspecified essential hypertension     Controlled w/o antihypertensive agent           Review of Systems:  Review of Systems  Constitutional: Negative for fever, chills, weight loss, malaise/fatigue and diaphoresis.  HENT: Positive for hearing  loss. Negative for ear pain, nosebleeds, congestion, sore throat, neck pain, tinnitus and ear discharge.   Eyes: Negative for blurred vision, double vision, photophobia, pain, discharge and redness.  Respiratory: Negative for cough, hemoptysis, sputum production, shortness of breath, wheezing and stridor.   Cardiovascular: Positive for leg swelling. Negative for chest pain, palpitations, orthopnea, claudication and PND.  Gastrointestinal: Positive for constipation. Negative for heartburn, nausea, vomiting, abdominal pain, diarrhea, blood in stool and melena.  Genitourinary: Positive for frequency (adult depends). Negative for dysuria, urgency and flank pain.  Musculoskeletal: Positive for joint pain (left shoudler) and falls. Negative for myalgias and back pain.  Skin: Negative for itching and rash.  Neurological: Positive for tremors (diffused rigidity. ) and weakness (generalized). Negative for dizziness, tingling, sensory change, speech change, focal weakness, seizures, loss of consciousness and headaches.  Endo/Heme/Allergies: Negative for environmental allergies and polydipsia. Does not bruise/bleed easily.  Psychiatric/Behavioral: Positive for depression and memory loss. Negative for suicidal ideas, hallucinations and substance abuse. The patient is nervous/anxious. The patient does not have insomnia.      Past Medical History  Diagnosis Date  . Allergy   . Asthma   . Esophageal reflux   . Dysfunction of eustachian tube   . Irritable bowel syndrome with constipation   . Diverticulosis   . Peptic ulcer disease   . Osteoporosis   . Peripheral  neuropathy   . Trigeminal neuralgia     prior injury  . 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 Appendectomy    . Abdominal hysterectomy    . Tonsillectomy    . Carotid endarterectomy     Social History:   reports that she has  never smoked. She does not have any smokeless tobacco history on file. She reports that she does not drink alcohol or use illicit drugs.  Family History  Problem Relation Age of Onset  . Alzheimer's disease Mother   . Coronary artery disease Father   . Heart attack Father   . Heart disease Father     AAA  . Ovarian cancer Sister   . Lung cancer Brother   . Heart disease Brother     AAA  . Diabetes Neg Hx   . Colon cancer Neg Hx     Medications: Patient's Medications  New Prescriptions   No medications on file  Previous Medications   AMITRIPTYLINE (ELAVIL) 75 MG TABLET    Take 50 mg by mouth at bedtime.    ASPIRIN 81 MG EC TABLET    Take 81 mg by mouth daily.     CALCIUM CARBONATE-VITAMIN D (CALTRATE 600+D) 600-400 MG-UNIT PER TABLET    Take 1 tablet by mouth 2 (two) times daily.     CHLORDIAZEPOXIDE (LIBRIUM) 25 MG CAPSULE    Take 1 capsule (25 mg total) by mouth at bedtime and may repeat dose one time if needed.   DOCUSATE SODIUM (COLACE) 100 MG CAPSULE    Take 200 mg by mouth 2 (two) times daily.    EZETIMIBE (ZETIA) 10 MG TABLET    Take 1 tablet (10 mg total) by mouth daily.   FAMOTIDINE (PEPCID) 20 MG TABLET       FUROSEMIDE (LASIX) 20 MG TABLET    Take 20 mg by mouth every morning. 1 by mouth every am diuretic for peripheral edema    LORAZEPAM (ATIVAN) 0.5 MG TABLET    Take 0.5 mg by mouth 2 (two) times daily.     MIRABEGRON ER (MYRBETRIQ) 25 MG TB24 TABLET    Take 25 mg by mouth daily.   MULTIPLE VITAMINS-MINERALS (CENTRUM SILVER PO)    Take 1 tablet by mouth.     POLYETHYLENE GLYCOL POWDER (GLYCOLAX/MIRALAX) POWDER    Take 17 g by mouth as needed.   THIOTHIXENE (NAVANE) 2 MG CAPSULE    Take 2 mg by mouth 2 (two) times daily. Takes one am and two qhs since 05/04/13 per [facility].   TRAMADOL (ULTRAM) 50 MG TABLET    Take 1 tablet (50 mg total) by mouth every 6 (six) hours as needed for pain.  Modified Medications   No medications on file   Discontinued Medications   ALBUTEROL (PROAIR HFA) 108 (90 BASE) MCG/ACT INHALER    Inhale 2 puffs into the lungs 4 (four) times daily as needed for wheezing or shortness of breath.   AMLODIPINE (NORVASC) 5 MG TABLET    Take 1 tablet (5 mg total) by mouth daily.   CARBOXYMETHYLCELLULOSE (REFRESH) 1 % OPHTHALMIC SOLUTION    Apply 2 drops to eye 3 (three) times daily.     CHOLECALCIFEROL (VITAMIN D3) 1000 UNITS CAPS    Take 2 capsules by mouth daily.     COENZYME Q10 (COQ-10 PO)  Take 1 tablet by mouth daily.     FISH OIL-OMEGA-3 FATTY ACIDS 1000 MG CAPSULE    Take 2 g by mouth daily.    FLUTICASONE (FLONASE) 50 MCG/ACT NASAL SPRAY    Place 2 sprays into the nose daily as needed.   MELOXICAM (MOBIC) 7.5 MG TABLET    Take 7.5 mg by mouth daily.     Physical Exam: Physical Exam  Constitutional: She is oriented to person, place, and time. She appears well-developed and well-nourished. No distress.  HENT:  Head: Normocephalic and atraumatic.  Right Ear: External ear normal.  Left Ear: External ear normal.  Eyes: Conjunctivae and EOM are normal. Pupils are equal, round, and reactive to light.  Neck: Neck supple.  Cardiovascular: Normal rate, regular rhythm and normal heart sounds.   Pulmonary/Chest: Effort normal and breath sounds normal. No respiratory distress. She has no rales. She exhibits no tenderness.  Abdominal: Soft. Bowel sounds are normal.  Musculoskeletal: She exhibits edema (trace). She exhibits no tenderness.       Left shoulder: She exhibits decreased range of motion, bony tenderness, swelling and pain. She exhibits no effusion, no crepitus, no deformity, no laceration, no spasm, normal pulse and normal strength.  Left proximal humerus fx-healing.  Neurological: She is alert and oriented to person, place, and time. She has normal reflexes. No cranial nerve deficit.  Skin: Skin is warm and dry.  Psychiatric: Her mood appears not anxious. Her affect is not angry, not blunt, not  labile and not inappropriate. Her speech is not rapid and/or pressured, not delayed, not tangential and not slurred. She is slowed. She is not agitated, not hyperactive, not withdrawn, not actively hallucinating and not combative. Thought content is not paranoid and not delusional. Cognition and memory are impaired. She does not express impulsivity or inappropriate judgment. She exhibits a depressed mood. She expresses no homicidal and no suicidal ideation. She expresses no suicidal plans and no homicidal plans. She is communicative. She exhibits abnormal recent memory.  A little anxious/flat affect/slow in responding, no frank hallucinosis.  She is attentive.    Filed Vitals:   05/07/13 1053  BP: 140/82  Pulse: 80  Temp: 98.3 F (36.8 C)  TempSrc: Tympanic  Resp: 16      Labs reviewed: Basic Metabolic Panel:  Recent Labs  16/10/96 04/21/13  NA 137 140  K 3.5 4.4  BUN 24* 21  CREATININE 1.0 0.8  TSH 1.37  --    Liver Function Tests:  Recent Labs  04/04/13 04/21/13  AST 99* 16  ALT 97* 10  ALKPHOS 125 111    CBC:  Recent Labs  04/04/13  WBC 10.2  HGB 10.1*  HCT 30*   Lipid Panel:  Recent Labs  04/04/13  CHOL 155  HDL 53  LDLCALC 80  TRIG 108    Past Procedures: 04/15/13 Liver US: mild diffuse fatty infiltration of the live with otherwise normal sonographic appearance to the liver and gallbladder.     Assessment/Plan Abdominal pain, generalized Resolved, 04/15/13 abd US unremarkable. 04/16/13 Amylase 19, Lipase <10.  ANXIETY With psychotic features, f/u psychiatry regularly, takes Lorazepam 05mg  bid, Thiothixene 2mg  am and 4mg  since 05/04/13 per Psychiatry, Amitriptyline 50mg  hs, and Chlordiazepoxide 25mg  hs Psych hospitalization 09/20/10-10/06/10 and 10/11/10-11/10/10 at [facility]. Staff stated she has been observed crying, paranoia, and obsession behaviors-too soon to eval since Thiothixene was increased 05/04/13    Hypertonicity of bladder Seems better  with  Myrbetriq 25mg  daily.     Liver  enzyme elevation Normalized since Tylenol prn and Mobic dc'd. Lipase, Amylase, abd US unremarkable.     Unspecified essential hypertension Controlled w/o antihypertensive agent      GERD (gastroesophageal reflux disease) Stabilized on Famotidine 20mg  daily.     PERIPHERAL EDEMA Trace BLE. Takes Furosemide 20mg   Closed fracture of unspecified part of upper end of humerus Saw Ortho 05/01/13--PT/OT full PROM L shoulder, no active motion yet and ok to dc sling and use arm at waist level w/o lifting, f/u 4 wks.     Family/ Staff Communication: observe the patient  Goals of Care: SNF  Labs/tests ordered: none

## 2013-05-07 NOTE — Assessment & Plan Note (Addendum)
Saw Ortho 05/01/13--PT/OT full PROM L shoulder, no active motion yet and ok to dc sling and use arm at waist level w/o lifting, f/u 4 wks.

## 2013-05-07 NOTE — Assessment & Plan Note (Signed)
Trace BLE. Takes Furosemide 20mg 

## 2013-05-07 NOTE — Assessment & Plan Note (Signed)
Controlled w/o antihypertensive agent.   

## 2013-05-07 NOTE — Assessment & Plan Note (Signed)
Normalized since Tylenol prn and Mobic dc'd. Lipase, Amylase, abd US unremarkable.

## 2013-06-03 ENCOUNTER — Non-Acute Institutional Stay (SKILLED_NURSING_FACILITY): Payer: Medicare Other | Admitting: Nurse Practitioner

## 2013-06-03 ENCOUNTER — Encounter: Payer: Self-pay | Admitting: Nurse Practitioner

## 2013-06-03 DIAGNOSIS — F411 Generalized anxiety disorder: Secondary | ICD-10-CM

## 2013-06-03 DIAGNOSIS — S42209A Unspecified fracture of upper end of unspecified humerus, initial encounter for closed fracture: Secondary | ICD-10-CM

## 2013-06-03 DIAGNOSIS — K219 Gastro-esophageal reflux disease without esophagitis: Secondary | ICD-10-CM

## 2013-06-03 DIAGNOSIS — J4520 Mild intermittent asthma, uncomplicated: Secondary | ICD-10-CM

## 2013-06-03 DIAGNOSIS — K5900 Constipation, unspecified: Secondary | ICD-10-CM

## 2013-06-03 DIAGNOSIS — J45909 Unspecified asthma, uncomplicated: Secondary | ICD-10-CM

## 2013-06-03 DIAGNOSIS — I10 Essential (primary) hypertension: Secondary | ICD-10-CM

## 2013-06-03 DIAGNOSIS — N318 Other neuromuscular dysfunction of bladder: Secondary | ICD-10-CM

## 2013-06-03 DIAGNOSIS — R609 Edema, unspecified: Secondary | ICD-10-CM

## 2013-06-03 NOTE — Assessment & Plan Note (Signed)
Takes Colace 200mg  bid, prn-Dulcolax suppository I daily, Bisacodyl 5mg  po daily, Feet 4.5Oz PR daily, MOM 30mg  po daily, and MiraLax daily. The patient stated she has no had BM for a few days-will have prn laxative administered and observe the patient.

## 2013-06-03 NOTE — Progress Notes (Signed)
Patient ID: Jean [name], female   DOB: 06-06-1941, 72 y.o.   MRN: [id number] Code Status: DNR  Allergies  Allergen Reactions  . Azithromycin   . Clarithromycin   . Doxycycline   . Geodon [Ziprasidone Hydrochloride]   . Lipitor [Atorvastatin Calcium]   . Penicillins   . Sulfonamide Derivatives     Chief Complaint  Patient presents with  . Medical Managment of Chronic Issues    HPI: Patient is a 72 y.o. female seen in the SNF at [facility] today for evaluation of  Left arm pain and other chronic medical conditions.  Problem List Items Addressed This Visit   ANXIETY - Primary     With psychotic features, f/u psychiatry regularly, takes Lorazepam 05mg  bid, Thiothixene 2mg  am and 4mg  since 05/04/13 per Psychiatry, Amitriptyline 50mg  hs, and Chlordiazepoxide 25mg  hs Psych hospitalization 09/20/10-10/06/10 and 10/11/10-11/10/10 at [facility]. Stabilized.         Asthma, mild intermittent     Stable     Closed fracture of unspecified part of upper end of humerus     Left proximal humerus fx-f/u Ortho 05/29/13-advanced to active ROM as tolerated and f/u in 4weeks/X-ray. Limited over head ROM    CONSTIPATION     Takes Colace 200mg  bid, prn-Dulcolax suppository I daily, Bisacodyl 5mg  po daily, Feet 4.5Oz PR daily, MOM 30mg  po daily, and MiraLax daily. The patient stated she has no had BM for a few days-will have prn laxative administered and observe the patient.       GERD (gastroesophageal reflux disease)     Stabilized on Famotidine 20mg  daily.         Hypertonicity of bladder     Seems better with  Myrbetriq 25mg  daily.         PERIPHERAL EDEMA     Not apparent edema seen. Takes Furosemide 20mg  daily. Bun/creat 21/0.77 04/21/13      Unspecified essential hypertension     Controlled w/o antihypertensive agent             Review of Systems:  Review of Systems  Constitutional: Negative for fever, chills, weight loss, malaise/fatigue and diaphoresis.   HENT: Positive for hearing loss. Negative for ear pain, nosebleeds, congestion, sore throat, neck pain, tinnitus and ear discharge.   Eyes: Negative for blurred vision, double vision, photophobia, pain, discharge and redness.  Respiratory: Negative for cough, hemoptysis, sputum production, shortness of breath, wheezing and stridor.   Cardiovascular: Positive for leg swelling. Negative for chest pain, palpitations, orthopnea, claudication and PND.  Gastrointestinal: Positive for constipation. Negative for heartburn, nausea, vomiting, abdominal pain, diarrhea, blood in stool and melena.  Genitourinary: Positive for frequency (adult depends). Negative for dysuria, urgency and flank pain.  Musculoskeletal: Positive for joint pain (left shoudler) and falls. Negative for myalgias and back pain.  Skin: Negative for itching and rash.  Neurological: Positive for tremors (diffused rigidity. ) and weakness (generalized). Negative for dizziness, tingling, sensory change, speech change, focal weakness, seizures, loss of consciousness and headaches.  Endo/Heme/Allergies: Negative for environmental allergies and polydipsia. Does not bruise/bleed easily.  Psychiatric/Behavioral: Positive for depression and memory loss. Negative for suicidal ideas, hallucinations and substance abuse. The patient is nervous/anxious. The patient does not have insomnia.      Past Medical History  Diagnosis Date  . Allergy   . Asthma   . Esophageal reflux   . Dysfunction of eustachian tube   . Irritable bowel syndrome with constipation   . Diverticulosis   .  Peptic ulcer disease   . Osteoporosis   . Peripheral neuropathy   . Trigeminal neuralgia     prior injury  . 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 Appendectomy    . Abdominal hysterectomy    . Tonsillectomy    . Carotid endarterectomy     Social  History:   reports that she has never smoked. She does not have any smokeless tobacco history on file. She reports that she does not drink alcohol or use illicit drugs.  Family History  Problem Relation Age of Onset  . Alzheimer's disease Mother   . Coronary artery disease Father   . Heart attack Father   . Heart disease Father     AAA  . Ovarian cancer Sister   . Lung cancer Brother   . Heart disease Brother     AAA  . Diabetes Neg Hx   . Colon cancer Neg Hx     Medications: Patient's Medications  New Prescriptions   No medications on file  Previous Medications   AMITRIPTYLINE (ELAVIL) 75 MG TABLET    Take 50 mg by mouth at bedtime.    ASPIRIN 81 MG EC TABLET    Take 81 mg by mouth daily.     CALCIUM CARBONATE-VITAMIN D (CALTRATE 600+D) 600-400 MG-UNIT PER TABLET    Take 1 tablet by mouth 2 (two) times daily.     CHLORDIAZEPOXIDE (LIBRIUM) 25 MG CAPSULE    Take 1 capsule (25 mg total) by mouth at bedtime and may repeat dose one time if needed.   DOCUSATE SODIUM (COLACE) 100 MG CAPSULE    Take 200 mg by mouth 2 (two) times daily.    EZETIMIBE (ZETIA) 10 MG TABLET    Take 1 tablet (10 mg total) by mouth daily.   FAMOTIDINE (PEPCID) 20 MG TABLET       FUROSEMIDE (LASIX) 20 MG TABLET    Take 20 mg by mouth every morning. 1 by mouth every am diuretic for peripheral edema    LORAZEPAM (ATIVAN) 0.5 MG TABLET    Take 0.5 mg by mouth 2 (two) times daily.     MIRABEGRON ER (MYRBETRIQ) 25 MG TB24 TABLET    Take 25 mg by mouth daily.   MULTIPLE VITAMINS-MINERALS (CENTRUM SILVER PO)    Take 1 tablet by mouth.     POLYETHYLENE GLYCOL POWDER (GLYCOLAX/MIRALAX) POWDER    Take 17 g by mouth as needed.   THIOTHIXENE (NAVANE) 2 MG CAPSULE    Take 2 mg by mouth 2 (two) times daily. Takes one am and two qhs since 05/04/13 per [facility].   TRAMADOL (ULTRAM) 50 MG TABLET    Take 1 tablet (50 mg total) by mouth every 6 (six) hours as needed for pain.  Modified Medications   No  medications on file  Discontinued Medications   No medications on file     Physical Exam: Physical Exam  Constitutional: She is oriented to person, place, and time. She appears well-developed and well-nourished. No distress.  HENT:  Head: Normocephalic and atraumatic.  Right Ear: External ear normal.  Left Ear: External ear normal.  Eyes: Conjunctivae and EOM are normal. Pupils are equal, round, and reactive to light.  Neck: Neck supple.  Cardiovascular: Normal rate, regular rhythm and normal heart sounds.   Pulmonary/Chest: Effort normal and breath sounds normal. No  respiratory distress. She has no rales. She exhibits no tenderness.  Abdominal: Soft. Bowel sounds are normal.  Musculoskeletal: She exhibits edema (trace). She exhibits no tenderness.       Left shoulder: She exhibits decreased range of motion, bony tenderness, swelling and pain. She exhibits no effusion, no crepitus, no deformity, no laceration, no spasm, normal pulse and normal strength.  Left proximal humerus fx-healing.  Neurological: She is alert and oriented to person, place, and time. She has normal reflexes. No cranial nerve deficit.  Skin: Skin is warm and dry.  Psychiatric: Her mood appears not anxious. Her affect is not angry, not blunt, not labile and not inappropriate. Her speech is not rapid and/or pressured, not delayed, not tangential and not slurred. She is slowed. She is not agitated, not hyperactive, not withdrawn, not actively hallucinating and not combative. Thought content is not paranoid and not delusional. Cognition and memory are impaired. She does not express impulsivity or inappropriate judgment. She exhibits a depressed mood. She expresses no homicidal and no suicidal ideation. She expresses no suicidal plans and no homicidal plans. She is communicative. She exhibits abnormal recent memory.  A little anxious/flat affect/slow in responding, no frank hallucinosis.  She is attentive.    Filed Vitals:    06/03/13 1517  BP: 127/74  Pulse: 79  Temp: 98.2 F (36.8 C)  TempSrc: Tympanic  Resp: 19      Labs reviewed: Basic Metabolic Panel:  Recent Labs  16/10/96 04/21/13  NA 137 140  K 3.5 4.4  BUN 24* 21  CREATININE 1.0 0.8  TSH 1.37  --    Liver Function Tests:  Recent Labs  04/04/13 04/21/13  AST 99* 16  ALT 97* 10  ALKPHOS 125 111    CBC:  Recent Labs  04/04/13  WBC 10.2  HGB 10.1*  HCT 30*   Lipid Panel:  Recent Labs  04/04/13  CHOL 155  HDL 53  LDLCALC 80  TRIG 108    Past Procedures: 04/15/13 Liver US: mild diffuse fatty infiltration of the live with otherwise normal sonographic appearance to the liver and gallbladder.     Assessment/Plan ANXIETY With psychotic features, f/u psychiatry regularly, takes Lorazepam 05mg  bid, Thiothixene 2mg  am and 4mg  since 05/04/13 per Psychiatry, Amitriptyline 50mg  hs, and Chlordiazepoxide 25mg  hs Psych hospitalization 09/20/10-10/06/10 and 10/11/10-11/10/10 at [facility]. Stabilized.       Asthma, mild intermittent Stable   CONSTIPATION Takes Colace 200mg  bid, prn-Dulcolax suppository I daily, Bisacodyl 5mg  po daily, Feet 4.5Oz PR daily, MOM 30mg  po daily, and MiraLax daily. The patient stated she has no had BM for a few days-will have prn laxative administered and observe the patient.     Hypertonicity of bladder Seems better with  Myrbetriq 25mg  daily.       Closed fracture of unspecified part of upper end of humerus Left proximal humerus fx-f/u Ortho 05/29/13-advanced to active ROM as tolerated and f/u in 4weeks/X-ray. Limited over head ROM  Unspecified essential hypertension Controlled w/o antihypertensive agent        GERD (gastroesophageal reflux disease) Stabilized on Famotidine 20mg  daily.       PERIPHERAL EDEMA Not apparent edema seen. Takes Furosemide 20mg  daily. Bun/creat 21/0.77 04/21/13      Family/ Staff Communication: observe the patient  Goals of Care:  SNF  Labs/tests ordered: none

## 2013-06-03 NOTE — Assessment & Plan Note (Signed)
Seems better with  Myrbetriq 25mg daily.    

## 2013-06-03 NOTE — Assessment & Plan Note (Signed)
With psychotic features, f/u psychiatry regularly, takes Lorazepam 05mg bid, Thiothixene 2mg am and 4mg since 05/04/13 per Psychiatry, Amitriptyline 50mg hs, and Chlordiazepoxide 25mg hs Psych hospitalization 09/20/10-10/06/10 and 10/11/10-11/10/10 at [place]. Stabilized.        

## 2013-06-03 NOTE — Assessment & Plan Note (Signed)
Stable

## 2013-06-03 NOTE — Assessment & Plan Note (Signed)
Stabilized on Famotidine 20mg daily.  

## 2013-06-03 NOTE — Assessment & Plan Note (Signed)
Controlled w/o antihypertensive agent.   

## 2013-06-03 NOTE — Assessment & Plan Note (Signed)
Not apparent edema seen. Takes Furosemide 20mg  daily. Bun/creat 21/0.77 04/21/13

## 2013-06-03 NOTE — Assessment & Plan Note (Signed)
Left proximal humerus fx-f/u Ortho 05/29/13-advanced to active ROM as tolerated and f/u in 4weeks/X-ray. Limited over head ROM

## 2013-07-06 ENCOUNTER — Encounter: Payer: Self-pay | Admitting: *Deleted

## 2013-10-01 ENCOUNTER — Non-Acute Institutional Stay: Payer: Medicare Other | Admitting: Nurse Practitioner

## 2013-10-01 ENCOUNTER — Encounter: Payer: Self-pay | Admitting: Nurse Practitioner

## 2013-10-01 DIAGNOSIS — D638 Anemia in other chronic diseases classified elsewhere: Secondary | ICD-10-CM | POA: Insufficient documentation

## 2013-10-01 DIAGNOSIS — J3089 Other allergic rhinitis: Secondary | ICD-10-CM

## 2013-10-01 DIAGNOSIS — J309 Allergic rhinitis, unspecified: Secondary | ICD-10-CM

## 2013-10-01 DIAGNOSIS — K219 Gastro-esophageal reflux disease without esophagitis: Secondary | ICD-10-CM

## 2013-10-01 DIAGNOSIS — J302 Other seasonal allergic rhinitis: Secondary | ICD-10-CM

## 2013-10-01 DIAGNOSIS — N318 Other neuromuscular dysfunction of bladder: Secondary | ICD-10-CM

## 2013-10-01 DIAGNOSIS — R05 Cough: Secondary | ICD-10-CM | POA: Insufficient documentation

## 2013-10-01 DIAGNOSIS — F329 Major depressive disorder, single episode, unspecified: Secondary | ICD-10-CM

## 2013-10-01 DIAGNOSIS — K5909 Other constipation: Secondary | ICD-10-CM

## 2013-10-01 DIAGNOSIS — R609 Edema, unspecified: Secondary | ICD-10-CM

## 2013-10-01 DIAGNOSIS — S42209A Unspecified fracture of upper end of unspecified humerus, initial encounter for closed fracture: Secondary | ICD-10-CM

## 2013-10-01 DIAGNOSIS — I10 Essential (primary) hypertension: Secondary | ICD-10-CM

## 2013-10-01 DIAGNOSIS — K5900 Constipation, unspecified: Secondary | ICD-10-CM

## 2013-10-01 DIAGNOSIS — F3289 Other specified depressive episodes: Secondary | ICD-10-CM

## 2013-10-01 DIAGNOSIS — R059 Cough, unspecified: Secondary | ICD-10-CM | POA: Insufficient documentation

## 2013-10-01 NOTE — Assessment & Plan Note (Signed)
Seems better with  Myrbetriq 25mg daily.    

## 2013-10-01 NOTE — Assessment & Plan Note (Signed)
With psychotic features, f/u psychiatry regularly, takes Lorazepam 05mg  bid, Thiothixene 2mg  am and 4mg  since 05/04/13 per Psychiatry, Amitriptyline 50mg  hs, and Chlordiazepoxide 25mg  hs Psych hospitalization 09/20/10-10/06/10 and 10/11/10-11/10/10 at [facility]. Stabilized.

## 2013-10-01 NOTE — Assessment & Plan Note (Signed)
Controlled w/o antihypertensive agent.   

## 2013-10-01 NOTE — Assessment & Plan Note (Signed)
Healed. Takes Tylenol nightly and prn-adequate for pain control.

## 2013-10-01 NOTE — Assessment & Plan Note (Signed)
Takes Colace 200mg  bid and prn Laxatives-adequate.

## 2013-10-01 NOTE — Assessment & Plan Note (Signed)
Stabilized on Omeprazole 20mg  daily.

## 2013-10-01 NOTE — Assessment & Plan Note (Signed)
Mild, last Hgb was 10.1 04/04/13, update CBC

## 2013-10-01 NOTE — Assessment & Plan Note (Addendum)
Hacking, non-productive, doesn't interfere her sleep at night, not associated with swallow, CXR 09/22/13 unremarkable, will try Tussionex 18ml bid for 4 weeks. Continue with Flonase.

## 2013-10-01 NOTE — Progress Notes (Signed)
Patient ID: Jean [name], female   DOB: 1941-04-20, 73 y.o.   MRN: [id number]   Code Status: DNR  Allergies  Allergen Reactions  . Azithromycin   . Clarithromycin   . Doxycycline   . Geodon [Ziprasidone Hydrochloride]   . Lipitor [Atorvastatin Calcium]   . Penicillins   . Sulfonamide Derivatives     Chief Complaint  Patient presents with  . Medical Managment of Chronic Issues    cough  . Acute Visit    HPI: Patient is a 73 y.o. female seen in the [facility] at [facility] today for evaluation of cough  and other chronic medical conditions.  Problem List Items Addressed This Visit   DEPRESSION     With psychotic features, f/u psychiatry regularly, takes Lorazepam 05mg  bid, Thiothixene 2mg  am and 4mg  since 05/04/13 per Psychiatry, Amitriptyline 50mg  hs, and Chlordiazepoxide 25mg  hs Psych hospitalization 09/20/10-10/06/10 and 10/11/10-11/10/10 at [facility]. Stabilized.           Seasonal and perennial allergic rhinitis     Stable on Flonase     Relevant Medications      fluticasone (FLONASE) 50 MCG/ACT nasal spray   PERIPHERAL EDEMA     Not apparent, update BMP    Unspecified essential hypertension     Controlled w/o antihypertensive agent            Chronic constipation     Takes Colace 200mg  bid and prn Laxatives-adequate.         Closed fracture of unspecified part of upper end of humerus     Healed. Takes Tylenol nightly and prn-adequate for pain control.     Hypertonicity of bladder     Seems better with  Myrbetriq 25mg  daily.           GERD (gastroesophageal reflux disease)     Stabilized on Omeprazole 20mg  daily.           Relevant Medications      omeprazole (PRILOSEC) 20 MG capsule   Cough - Primary     Hacking, non-productive, doesn't interfere her sleep at night, not associated with swallow, CXR 09/22/13 unremarkable, will try Tussionex 43ml bid for 4 weeks. Continue with Flonase.     Anemia of chronic disease   Mild, last Hgb was 10.1 04/04/13, update CBC       Review of Systems:  Review of Systems  Constitutional: Negative for fever, chills, weight loss, malaise/fatigue and diaphoresis.  HENT: Positive for hearing loss. Negative for congestion, ear discharge, ear pain, nosebleeds, sore throat and tinnitus.   Eyes: Negative for blurred vision, double vision, photophobia, pain, discharge and redness.  Respiratory: Positive for cough. Negative for hemoptysis, sputum production, shortness of breath, wheezing and stridor.   Cardiovascular: Negative for chest pain, palpitations, orthopnea, claudication, leg swelling and PND.  Gastrointestinal: Positive for constipation. Negative for heartburn, nausea, vomiting, abdominal pain, diarrhea, blood in stool and melena.  Genitourinary: Positive for frequency (adult depends). Negative for dysuria, urgency and flank pain.  Musculoskeletal: Positive for falls and joint pain (left shoudler). Negative for back pain, myalgias and neck pain.  Skin: Negative for itching and rash.  Neurological: Positive for tremors (diffused rigidity. ). Negative for dizziness, tingling, sensory change, speech change, focal weakness, seizures, loss of consciousness, weakness (generalized) and headaches.  Endo/Heme/Allergies: Negative for environmental allergies and polydipsia. Does not bruise/bleed easily.  Psychiatric/Behavioral: Positive for depression and memory loss. Negative for suicidal ideas, hallucinations and substance abuse. The patient is nervous/anxious. The patient does  not have insomnia.      Past Medical History  Diagnosis Date  . Allergy   . Asthma   . Esophageal reflux   . Dysfunction of eustachian tube   . Irritable bowel syndrome with constipation   . Diverticulosis   . Peptic ulcer disease   . Osteoporosis   . Peripheral neuropathy   . Trigeminal neuralgia     prior injury  . 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 Appendectomy    . Abdominal hysterectomy    . Tonsillectomy    . Carotid endarterectomy     Social History:   reports that she has never smoked. She does not have any smokeless tobacco history on file. She reports that she does not drink alcohol or use illicit drugs.  Family History  Problem Relation Age of Onset  . Alzheimer's disease Mother   . Coronary artery disease Father   . Heart attack Father   . Heart disease Father     AAA  . Ovarian cancer Sister   . Lung cancer Brother   . Heart disease Brother     AAA  . Diabetes Neg Hx   . Colon cancer Neg Hx     Medications: Patient's Medications  New Prescriptions   No medications on file  Previous Medications   ACETAMINOPHEN (TYLENOL) 325 MG TABLET    Take 650 mg by mouth at bedtime. And q6hr prn   AMITRIPTYLINE (ELAVIL) 75 MG TABLET    Take 50 mg by mouth at bedtime.    ASPIRIN 81 MG EC TABLET    Take 81 mg by mouth daily.     CALCIUM CARBONATE-VITAMIN D (CALTRATE 600+D) 600-400 MG-UNIT PER TABLET    Take 1 tablet by mouth 2 (two) times daily.     CHLORDIAZEPOXIDE (LIBRIUM) 25 MG CAPSULE    Take 1 capsule (25 mg total) by mouth at bedtime and may repeat dose one time if needed.   DOCUSATE SODIUM (COLACE) 100 MG CAPSULE    Take 200 mg by mouth 2 (two) times daily.    FLUTICASONE (FLONASE) 50 MCG/ACT NASAL SPRAY    Place 1 spray into both nostrils 2 (two) times daily.   FUROSEMIDE (LASIX) 20 MG TABLET    Take 20 mg by mouth every morning. 1 by mouth every am diuretic for peripheral edema    LORAZEPAM (ATIVAN) 0.5 MG TABLET    Take 0.5 mg by mouth 2 (two) times daily.     MIRABEGRON ER (MYRBETRIQ) 25 MG TB24 TABLET    Take 25 mg by mouth daily.   MULTIPLE VITAMINS-MINERALS (CENTRUM SILVER PO)    Take 1 tablet by mouth.     OMEPRAZOLE (PRILOSEC) 20 MG CAPSULE    Take 20 mg by mouth daily.   POLYETHYLENE GLYCOL POWDER (GLYCOLAX/MIRALAX) POWDER    Take 17  g by mouth as needed.   THIOTHIXENE (NAVANE) 2 MG CAPSULE    Take 2 mg by mouth 2 (two) times daily. Takes one am and two qhs since 05/04/13 per [place].  Modified Medications   No medications on file  Discontinued Medications   EZETIMIBE (ZETIA) 10 MG TABLET    Take 1 tablet (10 mg total) by mouth daily.   FAMOTIDINE (PEPCID) 20 MG TABLET    Take 20 mg by mouth daily.    MELOXICAM (MOBIC)  7.5 MG TABLET    Take 7.5 mg by mouth daily as needed for pain. Take with food.   TRAMADOL (ULTRAM) 50 MG TABLET    Take 1 tablet (50 mg total) by mouth every 6 (six) hours as needed for pain.   TRAMADOL (ULTRAM-ER) 100 MG 24 HR TABLET    Take 100 mg by mouth daily as needed for pain. Take 1 tablet by mouth every 6 hours as needed for severe pain.     Physical Exam: Physical Exam  Constitutional: She is oriented to person, place, and time. She appears well-developed and well-nourished. No distress.  HENT:  Head: Normocephalic and atraumatic.  Right Ear: External ear normal.  Left Ear: External ear normal.  Eyes: Conjunctivae and EOM are normal. Pupils are equal, round, and reactive to light.  Neck: Neck supple.  Cardiovascular: Normal rate, regular rhythm and normal heart sounds.   Pulmonary/Chest: Effort normal and breath sounds normal. No respiratory distress. She has no rales. She exhibits no tenderness.  Abdominal: Soft. Bowel sounds are normal.  Musculoskeletal: She exhibits no edema (trace) and no tenderness.       Left shoulder: She exhibits decreased range of motion, bony tenderness, swelling and pain. She exhibits no effusion, no crepitus, no deformity, no laceration, no spasm, normal pulse and normal strength.  Left proximal humerus fx-healed  Neurological: She is alert and oriented to person, place, and time. She has normal reflexes. No cranial nerve deficit.  involuntary lip movements.   Skin: Skin is warm and dry.  Psychiatric: Her mood appears not anxious. Her  affect is not angry, not blunt, not labile and not inappropriate. Her speech is not rapid and/or pressured, not delayed, not tangential and not slurred. She is slowed. She is not agitated, not hyperactive, not withdrawn, not actively hallucinating and not combative. Thought content is not paranoid and not delusional. Cognition and memory are impaired. She does not express impulsivity or inappropriate judgment. She exhibits a depressed mood. She expresses no homicidal and no suicidal ideation. She expresses no suicidal plans and no homicidal plans. She is communicative. She exhibits abnormal recent memory.  A little anxious/flat affect/slow in responding, no frank hallucinosis.  She is attentive.    Filed Vitals:   10/01/13 1347  BP: 118/74  Pulse: 80  Temp: 98.2 F (36.8 C)  TempSrc: Tympanic  Resp: 18      Labs reviewed: Basic Metabolic Panel:  Recent Labs  67/61/95 04/21/13  NA 137 140  K 3.5 4.4  BUN 24* 21  CREATININE 1.0 0.8  TSH 1.37  --    Liver Function Tests:  Recent Labs  04/04/13 04/21/13  AST 99* 16  ALT 97* 10  ALKPHOS 125 111   CBC:  Recent Labs  04/04/13  WBC 10.2  HGB 10.1*  HCT 30*   Lipid Panel:  Recent Labs  04/04/13  CHOL 155  HDL 53  LDLCALC 80  TRIG 108     Past Procedures:  09/22/13 CXR no cardiomeagly, pulmonary vascular congestion, or pleural effusion, no inflammatory consolidate or suspicious nodule, minimal atelectasis at eh mid and lower lungs bilaterally.   Assessment/Plan Cough Hacking, non-productive, doesn't interfere her sleep at night, not associated with swallow, CXR 09/22/13 unremarkable, will try Tussionex 30ml bid for 4 weeks. Continue with Flonase.   GERD (gastroesophageal reflux disease) Stabilized on Omeprazole 20mg  daily.         Hypertonicity of bladder Seems better with  Myrbetriq 25mg  daily.  Closed fracture of unspecified part of upper end of humerus Healed. Takes Tylenol nightly and  prn-adequate for pain control.   Chronic constipation Takes Colace 200mg  bid and prn Laxatives-adequate.       Unspecified essential hypertension Controlled w/o antihypertensive agent          PERIPHERAL EDEMA Not apparent, update BMP  Seasonal and perennial allergic rhinitis Stable on Flonase   DEPRESSION With psychotic features, f/u psychiatry regularly, takes Lorazepam 05mg  bid, Thiothixene 2mg  am and 4mg  since 05/04/13 per Psychiatry, Amitriptyline 50mg  hs, and Chlordiazepoxide 25mg  hs Psych hospitalization 09/20/10-10/06/10 and 10/11/10-11/10/10 at [facility]. Stabilized.         Anemia of chronic disease Mild, last Hgb was 10.1 04/04/13, update CBC    Family/ Staff Communication: observe the patient.   Goals of Care: AL  Labs/tests ordered: CBC and BMP

## 2013-10-01 NOTE — Assessment & Plan Note (Signed)
Stable on Flonase.

## 2013-10-01 NOTE — Assessment & Plan Note (Signed)
Not apparent, update BMP

## 2013-10-08 LAB — BASIC METABOLIC PANEL
BUN: 17 mg/dL (ref 4–21)
CREATININE: 0.9 mg/dL (ref 0.5–1.1)
Glucose: 71 mg/dL
POTASSIUM: 4.4 mmol/L (ref 3.4–5.3)
Sodium: 138 mmol/L (ref 137–147)

## 2013-10-08 LAB — CBC AND DIFFERENTIAL
HCT: 38 % (ref 36–46)
Hemoglobin: 13.1 g/dL (ref 12.0–16.0)
PLATELETS: 344 10*3/uL (ref 150–399)
WBC: 7 10^3/mL

## 2013-12-18 ENCOUNTER — Encounter: Payer: Self-pay | Admitting: Internal Medicine

## 2013-12-24 ENCOUNTER — Encounter: Payer: Self-pay | Admitting: Internal Medicine

## 2014-02-04 ENCOUNTER — Non-Acute Institutional Stay: Payer: Medicare Other | Admitting: Internal Medicine

## 2014-02-04 ENCOUNTER — Encounter: Payer: Self-pay | Admitting: Internal Medicine

## 2014-02-04 VITALS — BP 120/70 | HR 66 | Temp 98.2°F | Resp 18 | Wt 139.8 lb

## 2014-02-04 DIAGNOSIS — R609 Edema, unspecified: Secondary | ICD-10-CM

## 2014-02-04 DIAGNOSIS — K5900 Constipation, unspecified: Secondary | ICD-10-CM

## 2014-02-04 DIAGNOSIS — R05 Cough: Secondary | ICD-10-CM

## 2014-02-04 DIAGNOSIS — K5909 Other constipation: Secondary | ICD-10-CM

## 2014-02-04 DIAGNOSIS — I10 Essential (primary) hypertension: Secondary | ICD-10-CM

## 2014-02-04 DIAGNOSIS — K219 Gastro-esophageal reflux disease without esophagitis: Secondary | ICD-10-CM

## 2014-02-04 DIAGNOSIS — R059 Cough, unspecified: Secondary | ICD-10-CM

## 2014-02-09 MED ORDER — SENNOSIDES-DOCUSATE SODIUM 8.6-50 MG PO TABS: ORAL_TABLET | ORAL | Status: DC

## 2014-02-09 NOTE — Progress Notes (Signed)
Patient ID: Jean [name], female   DOB: October 07, 1940, 73 y.o.   MRN: [id number]    Location:  [facility]  Place of Service: CLINIC   Allergies  Allergen Reactions  . Azithromycin   . Clarithromycin   . Doxycycline   . Geodon [Ziprasidone Hydrochloride]   . Lipitor [Atorvastatin Calcium]   . Penicillins   . Sulfonamide Derivatives     Chief Complaint  Patient presents with  . Medical Management of Chronic Issues    HPI:  Constipation: using Colace. Miralax seemed too strong  Hypertension: controlled  PERIPHERAL EDEMA: improved  Cough: improved  GERD (gastroesophageal reflux disease): asymptomatic    Medications: Patient's Medications  New Prescriptions   No medications on file  Previous Medications   ACETAMINOPHEN (TYLENOL) 325 MG TABLET    Take 650 mg by mouth at bedtime. And q6hr prn   AMITRIPTYLINE (ELAVIL) 75 MG TABLET    Take 50 mg by mouth at bedtime.    ASPIRIN 81 MG EC TABLET    Take 81 mg by mouth daily.     CALCIUM CARBONATE-VITAMIN D (CALTRATE 600+D) 600-400 MG-UNIT PER TABLET    Take 1 tablet by mouth 2 (two) times daily.     CHLORDIAZEPOXIDE (LIBRIUM) 25 MG CAPSULE    Take 1 capsule (25 mg total) by mouth at bedtime and may repeat dose one time if needed.   DOCUSATE SODIUM (COLACE) 100 MG CAPSULE    Take 200 mg by mouth 2 (two) times daily.    FLUTICASONE (FLONASE) 50 MCG/ACT NASAL SPRAY    Place 1 spray into both nostrils 2 (two) times daily.   FUROSEMIDE (LASIX) 20 MG TABLET    Take 20 mg by mouth every morning. 1 by mouth every am diuretic for peripheral edema    LORAZEPAM (ATIVAN) 0.5 MG TABLET    Take 0.5 mg by mouth 2 (two) times daily.     MIRABEGRON ER (MYRBETRIQ) 25 MG TB24 TABLET    Take 25 mg by mouth daily.   MULTIPLE VITAMINS-MINERALS (CENTRUM SILVER PO)    Take 1 tablet by mouth.     OMEPRAZOLE (PRILOSEC) 20 MG CAPSULE    Take 20 mg by mouth daily.   POLYETHYLENE GLYCOL POWDER (GLYCOLAX/MIRALAX) POWDER    Take 17 g by mouth as needed.   THIOTHIXENE (NAVANE) 2 MG CAPSULE    Take 2 mg by mouth 2 (two) times daily. Takes one am and two qhs since 05/04/13 per [place].  Modified Medications   No medications on file  Discontinued Medications   No medications on file     Review of Systems  Constitutional: Negative for fever, chills, activity change, appetite change and fatigue.  HENT: Positive for hearing loss. Negative for sneezing, sore throat, tinnitus and trouble swallowing.   Eyes: Negative.   Respiratory: Positive for cough and shortness of breath. Negative for choking.   Cardiovascular: Negative for chest pain, palpitations and leg swelling.  Gastrointestinal: Positive for constipation. Negative for diarrhea, blood in stool and abdominal distention.  Endocrine: Negative.   Genitourinary: Negative for dysuria, frequency, flank pain, vaginal discharge, enuresis and difficulty urinating.  Skin: Negative.   Allergic/Immunologic: Negative.   Neurological:       Memory loss  Psychiatric/Behavioral: Positive for sleep disturbance and dysphoric mood. Negative for behavioral problems and confusion. The patient is nervous/anxious.     Filed Vitals:   02/04/14 1604  BP: 120/70  Pulse: 66  Temp: 98.2 F (36.8 C)  TempSrc:  Oral  Resp: 18  Weight: 139 lb 12.8 oz (63.413 kg)   Body mass index is 26.43 kg/(m^2).  Physical Exam  Constitutional: She appears well-developed and well-nourished. No distress.  HENT:  Right Ear: External ear normal.  Left Ear: External ear normal.  Nose: Nose normal.  Mouth/Throat: Oropharynx is clear and moist. No oropharyngeal exudate.  Eyes: Conjunctivae and EOM are normal. Pupils are equal, round, and reactive to light.  Neck: No JVD present. No tracheal deviation present. No thyromegaly present.  Cardiovascular: Normal rate, regular rhythm, normal heart sounds and intact distal pulses.  Exam reveals no gallop and no friction rub.   No murmur heard. Abdominal:  She exhibits no distension and no mass. There is no tenderness.  Musculoskeletal: Normal range of motion. She exhibits no edema and no tenderness.  Lymphadenopathy:    She has no cervical adenopathy.  Neurological: She is alert. No cranial nerve deficit. Coordination normal.  Memory loss  Skin: No rash noted. No erythema. No pallor.  Psychiatric: She has a normal mood and affect. Her behavior is normal. Thought content normal.     Labs reviewed: No visits with results within 3 Month(s) from this visit. Latest known visit with results is:  Nursing Home on 05/06/2013  Component Date Value Ref Range Status  . Glucose 04/21/2013 83   Final  . BUN 04/21/2013 21  4 - 21 mg/dL Final  . Creatinine 04/21/2013 0.8  0.5 - 1.1 mg/dL Final  . Potassium 04/21/2013 4.4  3.4 - 5.3 mmol/L Final  . Sodium 04/21/2013 140  137 - 147 mmol/L Final  . Alkaline Phosphatase 04/21/2013 111  25 - 125 U/L Final  . ALT 04/21/2013 10  7 - 35 U/L Final  . AST 04/21/2013 16  13 - 35 U/L Final  . Bilirubin, Total 04/21/2013 0.3   Final      Assessment/Plan  1. Constipation - senna-docusate (SENOKOT S) 8.6-50 MG per tablet; 2 tablets nightly to help constipation  Dispense: 60 tablet; Refill: 5  2. Hypertension controlled  3. PERIPHERAL EDEMA improved  4. Cough improved  5. GERD (gastroesophageal reflux disease) stable

## 2014-02-11 ENCOUNTER — Encounter: Payer: Self-pay | Admitting: Nurse Practitioner

## 2014-02-11 ENCOUNTER — Other Ambulatory Visit: Payer: Self-pay | Admitting: Nurse Practitioner

## 2014-02-11 ENCOUNTER — Non-Acute Institutional Stay: Payer: Medicare Other | Admitting: Nurse Practitioner

## 2014-02-11 VITALS — BP 110/74 | HR 80 | Wt 142.0 lb

## 2014-02-11 DIAGNOSIS — K5909 Other constipation: Secondary | ICD-10-CM

## 2014-02-11 DIAGNOSIS — N318 Other neuromuscular dysfunction of bladder: Secondary | ICD-10-CM

## 2014-02-11 DIAGNOSIS — F411 Generalized anxiety disorder: Secondary | ICD-10-CM

## 2014-02-11 DIAGNOSIS — K219 Gastro-esophageal reflux disease without esophagitis: Secondary | ICD-10-CM

## 2014-02-11 DIAGNOSIS — F329 Major depressive disorder, single episode, unspecified: Secondary | ICD-10-CM

## 2014-02-11 DIAGNOSIS — K5900 Constipation, unspecified: Secondary | ICD-10-CM

## 2014-02-11 DIAGNOSIS — R29818 Other symptoms and signs involving the nervous system: Secondary | ICD-10-CM

## 2014-02-11 DIAGNOSIS — F3289 Other specified depressive episodes: Secondary | ICD-10-CM

## 2014-02-11 DIAGNOSIS — I10 Essential (primary) hypertension: Secondary | ICD-10-CM

## 2014-02-11 DIAGNOSIS — K649 Unspecified hemorrhoids: Secondary | ICD-10-CM | POA: Insufficient documentation

## 2014-02-11 DIAGNOSIS — R609 Edema, unspecified: Secondary | ICD-10-CM

## 2014-02-11 NOTE — Assessment & Plan Note (Addendum)
Persisted since MiraLax daily and Senokot II qhs. Adding Amitiza 28mcg bid. Observe. Continue prn Dulcolax suppository daily, Dulcolax 5mg  po daily prn, Fleet enema qod prn, MOM 72ml daily prn.

## 2014-02-11 NOTE — Assessment & Plan Note (Signed)
Takes Tylenol 650mg qhs-adequate 

## 2014-02-11 NOTE — Assessment & Plan Note (Signed)
Stabilized on Omeprazole 20mg  daily.

## 2014-02-11 NOTE — Assessment & Plan Note (Signed)
Controlled.  

## 2014-02-11 NOTE — Assessment & Plan Note (Signed)
Managed with Amitriptyline 50mg  hs, Lorazepam 0.5mg  bid, and Librium 25mg  hs.

## 2014-02-11 NOTE — Assessment & Plan Note (Signed)
Not apparent. Takes Furosemide 20mg.  

## 2014-02-11 NOTE — Assessment & Plan Note (Signed)
Takes Tylenol 650mg  nightly-adequate.

## 2014-02-11 NOTE — Assessment & Plan Note (Signed)
Stable. Off Myrbetriq 25mg  daily.

## 2014-02-11 NOTE — Assessment & Plan Note (Signed)
External and internal: no bleeding.

## 2014-02-11 NOTE — Assessment & Plan Note (Signed)
With psychotic features, f/u psychiatry regularly, takes Lorazepam 05mg bid,  Amitriptyline 50mg hs, and Chlordiazepoxide 25mg hs, and Fluphenazine 1mg daily.  Psych hospitalization x2 in the past.  Stabilized.  

## 2014-02-11 NOTE — Assessment & Plan Note (Signed)
Better controlled since Senokot II qhs and MiraLax daily since 02/04/14

## 2014-02-11 NOTE — Progress Notes (Signed)
Patient ID: Jean [name], female   DOB: [birth year], 73 y.o.   MRN: [id number]   Code Status: DNR  Allergies  Allergen Reactions  . Azithromycin   . Clarithromycin   . Doxycycline   . Geodon [Ziprasidone Hydrochloride]   . Lipitor [Atorvastatin Calcium]   . Penicillins   . Sulfonamide Derivatives     Chief Complaint  Patient presents with  . Constipation    still constipated, now has gas from new medication.  c/o bottom is sore.    HPI: Patient is a 73 y.o. female seen in the Clinic at [facility] today for evaluation of persisted constipation and other chronic medical conditions.  Problem List Items Addressed This Visit   ANXIETY     With psychotic features, f/u psychiatry regularly, takes Lorazepam 05mg  bid,  Amitriptyline 50mg  hs, and Chlordiazepoxide 25mg  hs, and Fluphenazine 1mg  daily.  Psych hospitalization x2 in the past.  Stabilized.        Constipation - Primary     Persisted since MiraLax daily and Senokot II qhs. Adding Amitiza 71mcg bid. Observe. Continue prn Dulcolax suppository daily, Dulcolax 5mg  po daily prn, Fleet enema qod prn, MOM 19ml daily prn.     Relevant Medications      bisacodyl (DULCOLAX) 10 MG suppository      bisacodyl (BISACODYL) 5 MG EC tablet      magnesium hydroxide (MILK OF MAGNESIA) 400 MG/5ML suspension   GERD (gastroesophageal reflux disease)     Stable. Continue Omeprazole 20mg  and prn Mintox 71ml q4h prn.     Relevant Medications      bisacodyl (DULCOLAX) 10 MG suppository      bisacodyl (BISACODYL) 5 MG EC tablet      magnesium hydroxide (MILK OF MAGNESIA) 400 MG/5ML suspension      alum & mag hydroxide-simeth (MYLANTA) 200-200-20 MG/5ML suspension   Hemorrhoids     External and internal: no bleeding.     Hypertension     Controlled     MUSCULOSKELETAL PAIN     Takes Tylenol 650mg  qhs-adequate.     PERIPHERAL EDEMA     Not apparent. Takes Furosemide 20mg         Review of Systems:  Review of Systems    Constitutional: Negative for fever, chills, weight loss, malaise/fatigue and diaphoresis.  HENT: Positive for hearing loss. Negative for congestion, ear discharge, ear pain, nosebleeds, sore throat and tinnitus.   Eyes: Negative for blurred vision, double vision, photophobia, pain, discharge and redness.  Respiratory: Positive for cough. Negative for hemoptysis, sputum production, shortness of breath, wheezing and stridor.   Cardiovascular: Negative for chest pain, palpitations, orthopnea, claudication, leg swelling and PND.  Gastrointestinal: Positive for constipation. Negative for heartburn, nausea, vomiting, abdominal pain, diarrhea, blood in stool and melena.  Genitourinary: Positive for frequency (adult depends). Negative for dysuria, urgency and flank pain.  Musculoskeletal: Positive for falls and joint pain (left shoudler). Negative for back pain, myalgias and neck pain.  Skin: Negative for itching and rash.  Neurological: Positive for tremors (diffused rigidity. ). Negative for dizziness, tingling, sensory change, speech change, focal weakness, seizures, loss of consciousness, weakness (generalized) and headaches.       Lips smacking  Endo/Heme/Allergies: Negative for environmental allergies and polydipsia. Does not bruise/bleed easily.  Psychiatric/Behavioral: Positive for depression and memory loss. Negative for suicidal ideas, hallucinations and substance abuse. The patient is nervous/anxious. The patient does not have insomnia.      Past Medical History  Diagnosis Date  .  Allergy   . Asthma   . Esophageal reflux   . Dysfunction of eustachian tube   . Irritable bowel syndrome with constipation   . Diverticulosis   . Peptic ulcer disease   . Osteoporosis   . Peripheral neuropathy   . Trigeminal neuralgia     prior injury  . 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 Appendectomy    . Abdominal hysterectomy    . Tonsillectomy    . Carotid endarterectomy     Social History:   reports that she has never smoked. She does not have any smokeless tobacco history on file. She reports that she does not drink alcohol or use illicit drugs.  Family History  Problem Relation Age of Onset  . Alzheimer's disease Mother   . Coronary artery disease Father   . Heart attack Father   . Heart disease Father     AAA  . Ovarian cancer Sister   . Lung cancer Brother   . Heart disease Brother     AAA  . Diabetes Neg Hx   . Colon cancer Neg Hx     Medications: Patient's Medications  New Prescriptions   No medications on file  Previous Medications   ACETAMINOPHEN (TYLENOL) 325 MG TABLET    Take 650 mg by mouth at bedtime. And q6hr prn   ALUM & MAG HYDROXIDE-SIMETH (MYLANTA) 846-962-95 MG/5ML SUSPENSION    Take by mouth. Take 40ml every 4 hours as needed for indigestion   AMITRIPTYLINE (ELAVIL) 75 MG TABLET    Take 50 mg by mouth at bedtime.    ASPIRIN 81 MG EC TABLET    Take 81 mg by mouth daily.     BISACODYL (BISACODYL) 5 MG EC TABLET    Take 5 mg by mouth. Take one tablet by mouth every day as needed for constipation   BISACODYL (DULCOLAX) 10 MG SUPPOSITORY    Place 10 mg rectally. Insert 1 supp rectally every day as needed for constipation   CALCIUM CARBONATE-VITAMIN D (CALTRATE 600+D) 600-400 MG-UNIT PER TABLET    Take 1 tablet by mouth 2 (two) times daily.     CHLORDIAZEPOXIDE (LIBRIUM) 25 MG CAPSULE    Take 1 capsule (25 mg total) by mouth at bedtime and may repeat dose one time if needed.   FLUPHENAZINE (PROLIXIN) 1 MG TABLET    Take 1 mg by mouth at bedtime.   FLUTICASONE (FLONASE) 50 MCG/ACT NASAL SPRAY    Place 1 spray into both nostrils 2 (two) times daily.   FUROSEMIDE (LASIX) 20 MG TABLET    Take 20 mg by mouth every morning. 1 by mouth every am diuretic for peripheral edema    LORAZEPAM (ATIVAN) 0.5 MG TABLET    Take 0.5 mg by mouth 2 (two) times  daily.     MAGNESIUM HYDROXIDE (MILK OF MAGNESIA) 400 MG/5ML SUSPENSION    Take by mouth. Take 30 ml by mouth every day as needed for constipation   MIRABEGRON ER (MYRBETRIQ) 25 MG TB24 TABLET    Take 25 mg by mouth daily.   MULTIPLE VITAMINS-MINERALS (CENTRUM SILVER PO)    Take 1 tablet by mouth.     OMEPRAZOLE (PRILOSEC) 20 MG CAPSULE    Take 20 mg by mouth daily.   POLYETHYLENE GLYCOL POWDER (GLYCOLAX/MIRALAX) POWDER    Take 17 g by mouth as needed.  SENNA-DOCUSATE (SENOKOT S) 8.6-50 MG PER TABLET    2 tablets nightly to help constipation   THIOTHIXENE (NAVANE) 2 MG CAPSULE    Take 2 mg by mouth 2 (two) times daily. Takes one am and two qhs since 05/04/13 per [place].  Modified Medications   No medications on file  Discontinued Medications   No medications on file     Physical Exam: Physical Exam  Constitutional: She is oriented to person, place, and time. She appears well-developed and well-nourished. No distress.  HENT:  Head: Normocephalic and atraumatic.  Right Ear: External ear normal.  Left Ear: External ear normal.  Eyes: Conjunctivae and EOM are normal. Pupils are equal, round, and reactive to light.  Neck: Neck supple.  Cardiovascular: Normal rate, regular rhythm and normal heart sounds.   Pulmonary/Chest: Effort normal and breath sounds normal. No respiratory distress. She has no rales. She exhibits no tenderness.  Abdominal: Soft. Bowel sounds are normal. She exhibits no distension. There is no tenderness. There is no rebound and no guarding.  External hemorrhoids-not inflamed.   Musculoskeletal: She exhibits no edema (trace) and no tenderness.       Left shoulder: She exhibits decreased range of motion, bony tenderness, swelling and pain. She exhibits no effusion, no crepitus, no deformity, no laceration, no spasm, normal pulse and normal strength.  Left proximal humerus fx-healed  Neurological: She is alert and oriented to person, place, and  time. She has normal reflexes. No cranial nerve deficit.  involuntary lip movements.   Skin: Skin is warm and dry.  Psychiatric: Her mood appears not anxious. Her affect is not angry, not blunt, not labile and not inappropriate. Her speech is not rapid and/or pressured, not delayed, not tangential and not slurred. She is slowed. She is not agitated, not hyperactive, not withdrawn, not actively hallucinating and not combative. Thought content is not paranoid and not delusional. Cognition and memory are impaired. She does not express impulsivity or inappropriate judgment. She exhibits a depressed mood. She expresses no homicidal and no suicidal ideation. She expresses no suicidal plans and no homicidal plans. She is communicative. She exhibits abnormal recent memory.  A little anxious/flat affect/slow in responding, no frank hallucinosis.  She is attentive.    Filed Vitals:   02/11/14 1340  BP: 110/74  Pulse: 80  Weight: 142 lb (64.411 kg)      Labs reviewed: Basic Metabolic Panel:  Recent Labs  04/04/13 04/21/13 10/08/13  NA 137 140 138  K 3.5 4.4 4.4  BUN 24* 21 17  CREATININE 1.0 0.8 0.9  TSH 1.37  --   --    Liver Function Tests:  Recent Labs  04/04/13 04/21/13  AST 99* 16  ALT 97* 10  ALKPHOS 125 111   CBC:  Recent Labs  04/04/13 10/08/13  WBC 10.2 7.0  HGB 10.1* 13.1  HCT 30* 38  PLT  --  344   Lipid Panel:  Recent Labs  04/04/13  CHOL 155  HDL 53  LDLCALC 80  TRIG 108     Past Procedures:  09/22/13 CXR no cardiomeagly, pulmonary vascular congestion, or pleural effusion, no inflammatory consolidate or suspicious nodule, minimal atelectasis at eh mid and lower lungs bilaterally.   Assessment/Plan Constipation Persisted since MiraLax daily and Senokot II qhs. Adding Amitiza 30mcg bid. Observe. Continue prn Dulcolax suppository daily, Dulcolax 5mg  po daily prn, Fleet enema qod prn, MOM 62ml daily prn.   ANXIETY With psychotic features, f/u psychiatry  regularly,  takes Lorazepam 05mg  bid,  Amitriptyline 50mg  hs, and Chlordiazepoxide 25mg  hs, and Fluphenazine 1mg  daily.  Psych hospitalization x2 in the past.  Stabilized.      Hypertension Controlled   PERIPHERAL EDEMA Not apparent. Takes Furosemide 20mg    GERD (gastroesophageal reflux disease) Stable. Continue Omeprazole 20mg  and prn Mintox 22ml q4h prn.   MUSCULOSKELETAL PAIN Takes Tylenol 650mg  qhs-adequate.   Hemorrhoids External and internal: no bleeding.     Family/ Staff Communication: observe the patient.   Goals of Care: AL  Labs/tests ordered: none

## 2014-02-11 NOTE — Assessment & Plan Note (Signed)
Not apparent, taking Furosmide 20mg  daily.

## 2014-02-11 NOTE — Assessment & Plan Note (Addendum)
Stable. Continue Omeprazole 20mg and prn Mintox 30ml q4h prn.  

## 2014-02-11 NOTE — Assessment & Plan Note (Signed)
Flat affect. But able to function in AL setting.

## 2014-02-25 ENCOUNTER — Non-Acute Institutional Stay: Payer: Medicare Other | Admitting: Nurse Practitioner

## 2014-02-25 ENCOUNTER — Encounter: Payer: Self-pay | Admitting: Nurse Practitioner

## 2014-02-25 VITALS — BP 140/82 | HR 80 | Wt 141.0 lb

## 2014-02-25 DIAGNOSIS — K5909 Other constipation: Secondary | ICD-10-CM

## 2014-02-25 DIAGNOSIS — F411 Generalized anxiety disorder: Secondary | ICD-10-CM

## 2014-02-25 DIAGNOSIS — R29818 Other symptoms and signs involving the nervous system: Secondary | ICD-10-CM

## 2014-02-25 DIAGNOSIS — R609 Edema, unspecified: Secondary | ICD-10-CM

## 2014-02-25 DIAGNOSIS — K219 Gastro-esophageal reflux disease without esophagitis: Secondary | ICD-10-CM

## 2014-02-25 DIAGNOSIS — K5900 Constipation, unspecified: Secondary | ICD-10-CM

## 2014-02-25 NOTE — Assessment & Plan Note (Addendum)
C/o loose stools, no abd pain, but did c/o her stomach swollen all way up to her rib cage. Takes MiraLax daily and Senokot II qhs and Amitiza 25mcg bid. Observe. Continue prn Dulcolax suppository daily, Dulcolax 5mg  po daily prn, Fleet enema qod prn, MOM 30ml daily prn. CBC, CMP, TSH. GI consultation needed.

## 2014-02-25 NOTE — Assessment & Plan Note (Signed)
Not apparent. Takes Furosemide 20mg . Update CMP

## 2014-02-25 NOTE — Assessment & Plan Note (Signed)
Stable. Continue Omeprazole 20mg and prn Mintox 30ml q4h prn.  

## 2014-02-25 NOTE — Progress Notes (Signed)
Patient ID: Jean [name], female   DOB: Jul 12, 1941, 73 y.o.   MRN: [id number]   Code Status: DNR  Allergies  Allergen Reactions  . Azithromycin   . Clarithromycin   . Doxycycline   . Geodon [Ziprasidone Hydrochloride]   . Lipitor [Atorvastatin Calcium]   . Penicillins   . Sulfonamide Derivatives     Chief Complaint  Patient presents with  . Medical Management of Chronic Issues    constipation follow-up after starting new medication    HPI: Patient is a 73 y.o. female seen in the Clinic at [facility] today for evaluation of persisted constipation and other chronic medical conditions.  Problem List Items Addressed This Visit   PERIPHERAL EDEMA     Not apparent. Takes Furosemide 20mg . Update CMP     MUSCULOSKELETAL PAIN     Takes Tylenol 650mg  qhs-adequate    GERD (gastroesophageal reflux disease)     Stable. Continue Omeprazole 20mg  and prn Mintox 59ml q4h prn.      Relevant Medications      sodium phosphate (FLEET) 7-19 GM/118ML ENEM      lubiprostone (AMITIZA) 24 MCG capsule   Constipation - Primary     C/o loose stools, no abd pain, but did c/o her stomach swollen all way up to her rib cage. Takes MiraLax daily and Senokot II qhs and Amitiza 46mcg bid. Observe. Continue prn Dulcolax suppository daily, Dulcolax 5mg  po daily prn, Fleet enema qod prn, MOM 42ml daily prn. CBC, CMP, TSH. GI consultation needed.     Relevant Medications      sodium phosphate (FLEET) 7-19 GM/118ML ENEM   ANXIETY     With psychotic features, f/u psychiatry regularly, takes Lorazepam 05mg  bid,  Amitriptyline 50mg  hs, and Chlordiazepoxide 25mg  hs, and Fluphenazine 1mg  daily.  Psych hospitalization x2 in the past.  Stabilized.           Review of Systems:  Review of Systems  Constitutional: Negative for fever, chills, weight loss, malaise/fatigue and diaphoresis.  HENT: Positive for hearing loss. Negative for congestion, ear discharge, ear pain, nosebleeds, sore throat and  tinnitus.   Eyes: Negative for blurred vision, double vision, photophobia, pain, discharge and redness.  Respiratory: Positive for cough. Negative for hemoptysis, sputum production, shortness of breath, wheezing and stridor.   Cardiovascular: Negative for chest pain, palpitations, orthopnea, claudication, leg swelling and PND.  Gastrointestinal: Positive for constipation. Negative for heartburn, nausea, vomiting, abdominal pain, diarrhea, blood in stool and melena.  Genitourinary: Positive for frequency (adult depends). Negative for dysuria, urgency and flank pain.  Musculoskeletal: Positive for falls and joint pain (left shoudler). Negative for back pain, myalgias and neck pain.  Skin: Negative for itching and rash.  Neurological: Positive for tremors (diffused rigidity. ). Negative for dizziness, tingling, sensory change, speech change, focal weakness, seizures, loss of consciousness, weakness (generalized) and headaches.       Lips smacking  Endo/Heme/Allergies: Negative for environmental allergies and polydipsia. Does not bruise/bleed easily.  Psychiatric/Behavioral: Positive for depression and memory loss. Negative for suicidal ideas, hallucinations and substance abuse. The patient is nervous/anxious. The patient does not have insomnia.      Past Medical History  Diagnosis Date  . Allergy   . Asthma   . Esophageal reflux   . Dysfunction of eustachian tube   . Irritable bowel syndrome with constipation   . Diverticulosis   . Peptic ulcer disease   . Osteoporosis   . Peripheral neuropathy   . Trigeminal neuralgia  prior injury  . 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 Appendectomy    . Abdominal hysterectomy    . Tonsillectomy    . Carotid endarterectomy     Social History:   reports that she has never smoked. She does not have any smokeless tobacco history on  file. She reports that she does not drink alcohol or use illicit drugs.  Family History  Problem Relation Age of Onset  . Alzheimer's disease Mother   . Coronary artery disease Father   . Heart attack Father   . Heart disease Father     AAA  . Ovarian cancer Sister   . Lung cancer Brother   . Heart disease Brother     AAA  . Diabetes Neg Hx   . Colon cancer Neg Hx     Medications: Patient's Medications  New Prescriptions   No medications on file  Previous Medications   ACETAMINOPHEN (TYLENOL) 325 MG TABLET    Take 650 mg by mouth at bedtime. And q6hr prn   ALUM & MAG HYDROXIDE-SIMETH (MYLANTA) 409-811-91 MG/5ML SUSPENSION    Take by mouth. Take 61ml every 4 hours as needed for indigestion   AMITRIPTYLINE (ELAVIL) 75 MG TABLET    Take 50 mg by mouth at bedtime.    ASPIRIN 81 MG EC TABLET    Take 81 mg by mouth daily.     BISACODYL (BISACODYL) 5 MG EC TABLET    Take 5 mg by mouth. Take one tablet by mouth every day as needed for constipation   BISACODYL (DULCOLAX) 10 MG SUPPOSITORY    Place 10 mg rectally. Insert 1 supp rectally every day as needed for constipation   CALCIUM CARBONATE-VITAMIN D (CALTRATE 600+D) 600-400 MG-UNIT PER TABLET    Take 1 tablet by mouth 2 (two) times daily.     CHLORDIAZEPOXIDE (LIBRIUM) 25 MG CAPSULE    Take 1 capsule (25 mg total) by mouth at bedtime and may repeat dose one time if needed.   FLUPHENAZINE (PROLIXIN) 1 MG TABLET    Take 1 mg by mouth at bedtime.   FLUTICASONE (FLONASE) 50 MCG/ACT NASAL SPRAY    Place 1 spray into both nostrils 2 (two) times daily.   FUROSEMIDE (LASIX) 20 MG TABLET    Take 20 mg by mouth every morning. 1 by mouth every am diuretic for peripheral edema    LORAZEPAM (ATIVAN) 0.5 MG TABLET    Take 0.5 mg by mouth 2 (two) times daily.     LUBIPROSTONE (AMITIZA) 24 MCG CAPSULE    Take 24 mcg by mouth. Take one twice daily   MAGNESIUM HYDROXIDE (MILK OF MAGNESIA) 400 MG/5ML SUSPENSION    Take by mouth. Take 30 ml by mouth every  day as needed for constipation   MIRABEGRON ER (MYRBETRIQ) 25 MG TB24 TABLET    Take 25 mg by mouth daily.   MULTIPLE VITAMINS-MINERALS (CENTRUM SILVER PO)    Take 1 tablet by mouth.     OMEPRAZOLE (PRILOSEC) 20 MG CAPSULE    Take 20 mg by mouth daily.   POLYETHYLENE GLYCOL POWDER (GLYCOLAX/MIRALAX) POWDER    Take 17 g by mouth as needed.   SENNA-DOCUSATE (SENOKOT S) 8.6-50 MG PER TABLET    2 tablets nightly to help constipation   SODIUM PHOSPHATE (FLEET) 7-19 GM/118ML ENEM    Place 1 enema rectally. One rectally  daily as needed   THIOTHIXENE (NAVANE) 2 MG CAPSULE    Take 2 mg by mouth 2 (two) times daily. Takes one am and two qhs since 05/04/13 per [place].   TRAMADOL (ULTRAM) 50 MG TABLET    Take by mouth. Take one every 6 hours as needed for pain  Modified Medications   No medications on file  Discontinued Medications   No medications on file     Physical Exam: Physical Exam  Constitutional: She is oriented to person, place, and time. She appears well-developed and well-nourished. No distress.  HENT:  Head: Normocephalic and atraumatic.  Right Ear: External ear normal.  Left Ear: External ear normal.  Eyes: Conjunctivae and EOM are normal. Pupils are equal, round, and reactive to light.  Neck: Neck supple.  Cardiovascular: Normal rate, regular rhythm and normal heart sounds.   Pulmonary/Chest: Effort normal and breath sounds normal. No respiratory distress. She has no rales. She exhibits no tenderness.  Abdominal: Soft. Bowel sounds are normal. She exhibits no distension. There is no tenderness. There is no rebound and no guarding.  External hemorrhoids-not inflamed.   Musculoskeletal: She exhibits no edema (trace) and no tenderness.       Left shoulder: She exhibits decreased range of motion, bony tenderness, swelling and pain. She exhibits no effusion, no crepitus, no deformity, no laceration, no spasm, normal pulse and normal strength.  Left proximal  humerus fx-healed  Neurological: She is alert and oriented to person, place, and time. She has normal reflexes. No cranial nerve deficit.  involuntary lip movements.   Skin: Skin is warm and dry.  Psychiatric: Her mood appears not anxious. Her affect is not angry, not blunt, not labile and not inappropriate. Her speech is not rapid and/or pressured, not delayed, not tangential and not slurred. She is slowed. She is not agitated, not hyperactive, not withdrawn, not actively hallucinating and not combative. Thought content is not paranoid and not delusional. Cognition and memory are impaired. She does not express impulsivity or inappropriate judgment. She exhibits a depressed mood. She expresses no homicidal and no suicidal ideation. She expresses no suicidal plans and no homicidal plans. She is communicative. She exhibits abnormal recent memory.  A little anxious/flat affect/slow in responding, no frank hallucinosis.  She is attentive.    Filed Vitals:   02/25/14 1442  BP: 140/82  Pulse: 80  Weight: 141 lb (63.957 kg)      Labs reviewed: Basic Metabolic Panel:  Recent Labs  04/04/13 04/21/13 10/08/13  NA 137 140 138  K 3.5 4.4 4.4  BUN 24* 21 17  CREATININE 1.0 0.8 0.9  TSH 1.37  --   --    Liver Function Tests:  Recent Labs  04/04/13 04/21/13  AST 99* 16  ALT 97* 10  ALKPHOS 125 111   CBC:  Recent Labs  04/04/13 10/08/13  WBC 10.2 7.0  HGB 10.1* 13.1  HCT 30* 38  PLT  --  344   Lipid Panel:  Recent Labs  04/04/13  CHOL 155  HDL 53  LDLCALC 80  TRIG 108     Past Procedures:  09/22/13 CXR no cardiomeagly, pulmonary vascular congestion, or pleural effusion, no inflammatory consolidate or suspicious nodule, minimal atelectasis at eh mid and lower lungs bilaterally.   Assessment/Plan Constipation C/o loose stools, no abd pain, but did c/o her stomach swollen all way up to her rib cage. Takes MiraLax daily and Senokot II qhs and Amitiza 22mcg bid. Observe.  Continue prn Dulcolax suppository daily, Dulcolax 5mg  po daily prn, Fleet enema qod prn, MOM 59ml daily prn. CBC, CMP, TSH. GI consultation needed.   PERIPHERAL EDEMA Not apparent. Takes Furosemide 20mg . Update CMP   GERD (gastroesophageal reflux disease) Stable. Continue Omeprazole 20mg  and prn Mintox 88ml q4h prn.    ANXIETY With psychotic features, f/u psychiatry regularly, takes Lorazepam 05mg  bid,  Amitriptyline 50mg  hs, and Chlordiazepoxide 25mg  hs, and Fluphenazine 1mg  daily.  Psych hospitalization x2 in the past.  Stabilized.      MUSCULOSKELETAL PAIN Takes Tylenol 650mg  qhs-adequate    Family/ Staff Communication: observe the patient. GI referral.   Goals of Care: AL  Labs/tests ordered: CBC, CMP, TSH

## 2014-02-25 NOTE — Assessment & Plan Note (Signed)
Takes Tylenol 650mg  qhs-adequate

## 2014-02-25 NOTE — Assessment & Plan Note (Signed)
With psychotic features, f/u psychiatry regularly, takes Lorazepam 05mg bid,  Amitriptyline 50mg hs, and Chlordiazepoxide 25mg hs, and Fluphenazine 1mg daily.  Psych hospitalization x2 in the past.  Stabilized.  

## 2014-03-02 ENCOUNTER — Ambulatory Visit: Payer: Medicare Other | Admitting: Gastroenterology

## 2014-03-02 ENCOUNTER — Encounter: Payer: Self-pay | Admitting: Gastroenterology

## 2014-03-02 ENCOUNTER — Ambulatory Visit (INDEPENDENT_AMBULATORY_CARE_PROVIDER_SITE_OTHER): Payer: Medicare Other | Admitting: Gastroenterology

## 2014-03-02 VITALS — BP 110/70 | HR 70 | Ht 61.0 in | Wt 143.0 lb

## 2014-03-02 DIAGNOSIS — K5900 Constipation, unspecified: Secondary | ICD-10-CM

## 2014-03-02 DIAGNOSIS — R198 Other specified symptoms and signs involving the digestive system and abdomen: Secondary | ICD-10-CM

## 2014-03-02 DIAGNOSIS — R194 Change in bowel habit: Secondary | ICD-10-CM | POA: Insufficient documentation

## 2014-03-02 LAB — BASIC METABOLIC PANEL
BUN: 17 mg/dL (ref 4–21)
Creatinine: 0.8 mg/dL (ref 0.5–1.1)
GLUCOSE: 82 mg/dL
POTASSIUM: 4.3 mmol/L (ref 3.4–5.3)
SODIUM: 139 mmol/L (ref 137–147)

## 2014-03-02 LAB — TSH: TSH: 2.48 u[IU]/mL (ref 0.41–5.90)

## 2014-03-02 LAB — CBC AND DIFFERENTIAL
HCT: 35 % — AB (ref 36–46)
HEMOGLOBIN: 11.5 g/dL — AB (ref 12.0–16.0)
Platelets: 451 10*3/uL — AB (ref 150–399)
WBC: 8.3 10^3/mL

## 2014-03-02 LAB — HEPATIC FUNCTION PANEL
ALT: 17 U/L (ref 7–35)
AST: 19 U/L (ref 13–35)
Alkaline Phosphatase: 115 U/L (ref 25–125)
Bilirubin, Total: 0.2 mg/dL

## 2014-03-02 MED ORDER — METAMUCIL CLEAR & NATURAL PO POWD: ORAL | Status: DC

## 2014-03-02 MED ORDER — LUBIPROSTONE 8 MCG PO CAPS: 8.0000 ug | ORAL_CAPSULE | Freq: Two times a day (BID) | ORAL | Status: DC

## 2014-03-02 MED ORDER — MOVIPREP 100 G PO SOLR: 1.0000 | Freq: Once | ORAL | Status: DC

## 2014-03-02 NOTE — Progress Notes (Signed)
     03/02/2014 Jean [name] [id number] 26-Dec-1940   History of Present Illness:  Patient is a 73 year old female who was previously known to Dr. Velora Heckler.  She tells me that her care was turned over to Dr. Carlean Purl and that she has seen him before, but I only see where she has seen our NP in the past (on one occasion a note was signed by Dr. Fuller Plan and on another occasion a note was signed by Dr. Henrene Pastor).  But either way, patient is requesting Dr. Carlean Purl for her new physician.  She comes in to our office today for complaints of constipation and bloating.  She says that she passes liquid contents from her rectum several times per day (she does not understand that this is stool just not in solid form).  She is on SEVERAL medications including Miralax daily, Amitiza 24 mcg BID (which she says was just started a couple of weeks ago), senokot-S 2 tablets at bedtime, MOM prn, dulcolax suppositories and tablets prn.  She says that she just started the [place] a couple of weeks ago.  She complains of constant bloating and discomfort in her lower abdomen.  She is requesting a colonoscopy so we can "figure out what is wrong".  CBC and BMP were normal in 09/2013.  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  Her last colonoscopy was in 01/2007 by Dr. Velora Heckler at which time she was found to have diverticulosis and internal hemorrhoids.     Current Medications, Allergies, Past Medical History, Past Surgical History, Family History and Social History were reviewed in [employer] record.   Physical Exam: BP 110/70  Pulse 70  Ht 5\' 1"  (1.549 m)  Wt 143 lb (64.864 kg)  BMI 27.03 kg/m2 General: Well developed white female in no acute distress Head: Normocephalic and atraumatic Eyes:  Sclerae anicteric, conjunctiva pink  Ears: Normal auditory acuity Lungs: Clear throughout to auscultation Heart: Regular rate and rhythm Abdomen: Soft,  non-distended.  Normal bowel sounds.  Minimal lower abdominal TTP without R/R/G. Rectal:  Deferred.  Will be done at the time of colonoscopy. Musculoskeletal: Symmetrical with no gross deformities  Extremities: No edema  Neurological: Alert oriented x 4, grossly non-focal; has lip-smacking Psychological:  Alert and cooperative. Normal mood and affect  Assessment and Recommendations: -Change in bowel habits with constipation:  Saying that she has liquid stools now.  She is requesting colonoscopy.  Her last was 7 years ago.  She is on SEVERAL medications for constipation, which likely explains the liquid stools.  Will decrease the amitiza from 24 mcg BID to amitiza 8 mcg BID and it should be taken with food.  Discontinue miralax and Senna-S for now.  Need to added Metamucil daily.

## 2014-03-02 NOTE — Patient Instructions (Signed)
You have been scheduled for a colonoscopy with propofol with Dr. Carlean Purl. Please follow written instructions given to you at your visit today.  Please pick up your prep kit at the pharmacy within the next 1-3 days. If you use inhalers (even only as needed), please bring them with you on the day of your procedure. Your physician has requested that you go to www.startemmi.com and enter the access code given to you at your visit today. This web site gives a general overview about your procedure. However, you should still follow specific instructions given to you by our office regarding your preparation for the procedure.

## 2014-03-04 ENCOUNTER — Other Ambulatory Visit: Payer: Self-pay | Admitting: Nurse Practitioner

## 2014-03-04 DIAGNOSIS — R1032 Left lower quadrant pain: Secondary | ICD-10-CM

## 2014-03-04 DIAGNOSIS — R748 Abnormal levels of other serum enzymes: Secondary | ICD-10-CM

## 2014-03-04 DIAGNOSIS — F411 Generalized anxiety disorder: Secondary | ICD-10-CM

## 2014-03-04 DIAGNOSIS — D638 Anemia in other chronic diseases classified elsewhere: Secondary | ICD-10-CM

## 2014-03-04 NOTE — Progress Notes (Signed)
Agree 

## 2014-03-12 ENCOUNTER — Encounter: Payer: Self-pay | Admitting: Internal Medicine

## 2014-03-18 ENCOUNTER — Encounter: Payer: Self-pay | Admitting: Nurse Practitioner

## 2014-03-18 ENCOUNTER — Ambulatory Visit (AMBULATORY_SURGERY_CENTER): Payer: Medicare Other | Admitting: Internal Medicine

## 2014-03-18 ENCOUNTER — Encounter: Payer: Self-pay | Admitting: Internal Medicine

## 2014-03-18 VITALS — BP 135/74 | HR 74 | Temp 96.0°F | Resp 31 | Ht 61.0 in | Wt 143.0 lb

## 2014-03-18 DIAGNOSIS — K644 Residual hemorrhoidal skin tags: Secondary | ICD-10-CM

## 2014-03-18 DIAGNOSIS — R198 Other specified symptoms and signs involving the digestive system and abdomen: Secondary | ICD-10-CM

## 2014-03-18 DIAGNOSIS — K5730 Diverticulosis of large intestine without perforation or abscess without bleeding: Secondary | ICD-10-CM

## 2014-03-18 MED ORDER — SODIUM CHLORIDE 0.9 % IV SOLN: 500.0000 mL | INTRAVENOUS | Status: DC

## 2014-03-18 NOTE — Op Note (Signed)
[place]  [address]. [place], [address]   COLONOSCOPY PROCEDURE REPORT  PATIENT: Jean [name], Jean [name]  MR#: [id number] BIRTHDATE: [birth year] , 33  yrs. old GENDER: Female ENDOSCOPIST: Gatha Mayer, MD, [facility] PROCEDURE DATE:  03/18/2014 PROCEDURE:   Colonoscopy, diagnostic First Screening Colonoscopy - Avg.  risk and is 50 yrs.  old or older - No.  Prior Negative Screening - Now for repeat screening. N/A  History of Adenoma - Now for follow-up colonoscopy & has been > or = to 3 yrs.  N/A  Polyps Removed Today? No.  Recommend repeat exam, <10 yrs? No. ASA CLASS:   Class III INDICATIONS:Change in bowel habits. MEDICATIONS: Propofol (Diprivan) 180 mg IV, MAC sedation, administered by CRNA, and These medications were titrated to patient response per physician's verbal order  DESCRIPTION OF PROCEDURE:   After the risks benefits and alternatives of the procedure were thoroughly explained, informed consent was obtained.  A digital rectal exam revealed no rectal mass and A digital rectal exam revealed several skin tags.   The LB [id number]-[id number]  endoscope was introduced through the anus and advanced to the cecum, which was identified by both the appendix and ileocecal valve. No adverse events experienced.   The quality of the prep was adequate, using MoviPrep  The instrument was then slowly withdrawn as the colon was fully examined.  COLON FINDINGS: Severe diverticulosis was noted in the sigmoid colon.   Mild diverticulosis was noted The finding was in the right colon.   The colon mucosa was otherwise normal.   A right colon retroflexion was performed.  Retroflexed views revealed no abnormalities. The time to cecum=3 minutes 05 seconds.  Withdrawal time=7 minutes 28 seconds.  The scope was withdrawn and the procedure completed. COMPLICATIONS: There were no complications.  ENDOSCOPIC IMPRESSION: 1.   Severe diverticulosis was noted in the sigmoid colon 2.   Mild  diverticulosis was noted in the right colon 3.   The colon mucosa was otherwise normal - adequate prep  RECOMMENDATIONS: If current regimen is not adequately treating constipation then she needs to return to see me for more thorough rectal evaluation (? defecation dysfunction) and consideration of therapy changes   eSigned:  Gatha Mayer, MD, [facility] 03/18/2014 9:34 AM   cc: [facility]

## 2014-03-18 NOTE — Patient Instructions (Addendum)
You have diverticulosis and some anal skin tags. No active hemorrhoids. Cause of constipation could be from many things - medications, weak muscles.  If your current regimen is not helping then schedule a follow-up visit to see me in the office and we can adjust things.  I appreciate the opportunity to care for you. Gatha Mayer, MD, FACG  YOU HAD AN ENDOSCOPIC PROCEDURE TODAY AT [facility]: Refer to the procedure report that was given to you for any specific questions about what was found during the examination.  If the procedure report does not answer your questions, please call your gastroenterologist to clarify.  If you requested that your care partner not be given the details of your procedure findings, then the procedure report has been included in a sealed envelope for you to review at your convenience later.  YOU SHOULD EXPECT: Some feelings of bloating in the abdomen. Passage of more gas than usual.  Walking can help get rid of the air that was put into your GI tract during the procedure and reduce the bloating. If you had a lower endoscopy (such as a colonoscopy or flexible sigmoidoscopy) you may notice spotting of blood in your stool or on the toilet paper. If you underwent a bowel prep for your procedure, then you may not have a normal bowel movement for a few days.  DIET: Your first meal following the procedure should be a light meal and then it is ok to progress to your normal diet.  A half-sandwich or bowl of soup is an example of a good first meal.  Heavy or fried foods are harder to digest and may make you feel nauseous or bloated.  Likewise meals heavy in dairy and vegetables can cause extra gas to form and this can also increase the bloating.  Drink plenty of fluids but you should avoid alcoholic beverages for 24 hours.  ACTIVITY: Your care partner should take you home directly after the procedure.  You should plan to take it easy, moving slowly for the rest of  the day.  You can resume normal activity the day after the procedure however you should NOT DRIVE or use heavy machinery for 24 hours (because of the sedation medicines used during the test).    SYMPTOMS TO REPORT IMMEDIATELY: A gastroenterologist can be reached at any hour.  During normal business hours, 8:30 AM to 5:00 PM Monday through Friday, call [phone].  After hours and on weekends, please call the GI answering service at [phone] who will take a message and have the physician on call contact you.   Following lower endoscopy (colonoscopy or flexible sigmoidoscopy):  Excessive amounts of blood in the stool  Significant tenderness or worsening of abdominal pains  Swelling of the abdomen that is new, acute  Fever of 100F or higher      FOLLOW UP: If any biopsies were taken you will be contacted by phone or by letter within the next 1-3 weeks.  Call your gastroenterologist if you have not heard about the biopsies in 3 weeks.  Our staff will call the home number listed on your records the next business day following your procedure to check on you and address any questions or concerns that you may have at that time regarding the information given to you following your procedure. This is a courtesy call and so if there is no answer at the home number and we have not heard from you through the emergency physician  on call, we will assume that you have returned to your regular daily activities without incident.  SIGNATURES/CONFIDENTIALITY: You and/or your care partner have signed paperwork which will be entered into your electronic medical record.  These signatures attest to the fact that that the information above on your After Visit Summary has been reviewed and is understood.  Full responsibility of the confidentiality of this discharge information lies with you and/or your care-partner.  Diverticulosis information given.

## 2014-03-18 NOTE — Progress Notes (Signed)
Report to PACU, RN, vss, BBS= Clear.  

## 2014-03-18 NOTE — Progress Notes (Signed)
Pt. Returned to  [facility] with CNA via transportation.

## 2014-03-19 ENCOUNTER — Telehealth: Payer: Self-pay

## 2014-03-19 NOTE — Telephone Encounter (Signed)
Unable to reach anyone and unable to leave message.

## 2014-04-22 ENCOUNTER — Ambulatory Visit (INDEPENDENT_AMBULATORY_CARE_PROVIDER_SITE_OTHER): Payer: Medicare Other | Admitting: Internal Medicine

## 2014-04-22 ENCOUNTER — Ambulatory Visit: Payer: Medicare Other | Admitting: Internal Medicine

## 2014-04-22 ENCOUNTER — Encounter: Payer: Self-pay | Admitting: Internal Medicine

## 2014-04-22 ENCOUNTER — Encounter (INDEPENDENT_AMBULATORY_CARE_PROVIDER_SITE_OTHER): Payer: Self-pay

## 2014-04-22 VITALS — BP 122/80 | HR 72 | Ht 61.0 in | Wt 138.8 lb

## 2014-04-22 DIAGNOSIS — J45909 Unspecified asthma, uncomplicated: Secondary | ICD-10-CM

## 2014-04-22 DIAGNOSIS — J302 Other seasonal allergic rhinitis: Secondary | ICD-10-CM

## 2014-04-22 DIAGNOSIS — J3089 Other allergic rhinitis: Principal | ICD-10-CM

## 2014-04-22 DIAGNOSIS — J4520 Mild intermittent asthma, uncomplicated: Secondary | ICD-10-CM

## 2014-04-22 DIAGNOSIS — J309 Allergic rhinitis, unspecified: Secondary | ICD-10-CM

## 2014-04-22 NOTE — Patient Instructions (Signed)
Ok to continue flonase 1 puff twice daily in each nostril  Since you are doing so well, your primary physician can take care of your breathing needs. We will be happy to see you again if needed.

## 2014-04-22 NOTE — Progress Notes (Signed)
Subjective:    Patient ID: Jean [name], female    DOB: 09/08/1941, 73 y.o.   MRN: [id number]  HPI 04/24/11- [age]F never smoker Last here December 09, 2009.  Since last here only needed rescue inhaler 2-3 times since last here. Watery nose at times, with sneezing.  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 Son drove her here.  Incidental small sore just came up in right nostril at verge.   01/30/13- 71 yoF never smoker followed for asthma, allergic rhinitis FOLLOWS FOR: Breathing has been doing well. Denies chest pain/tightness, SOB or coughing. Still using Flonase, it is working well. She states she no longer wheezes and has not been needing a rescue inhaler. Her son can take her for a 1 mile walk without noticing wheezing. She asks refill of Flonase and denies epistaxis.  04/22/14- 72 yoF never smoker followed for asthma, allergic rhinitis, complicated by blind Follows [facility] runny nose, sneezing, sob or wheezing Breathing comfortably. Lives at assisted living. No epistaxis with nasal steroid spray. Chest clear and she does not feel she needs a medication. Does admit some dry cough without wheeze.  ROS-see HPI Constitutional:   No-   weight loss, night sweats, fevers, chills, fatigue, lassitude. HEENT:   No-  headaches, difficulty swallowing, tooth/dental problems, sore throat,       No-  sneezing, itching, ear ache, +nasal congestion, post nasal drip,  CV:  No-   chest pain, orthopnea, PND, swelling in lower extremities, anasarca, dizziness, palpitations Resp: No-   shortness of breath with exertion or at rest.              No-   productive cough,  + non-productive cough,  No- coughing up of blood.              No-   change in color of mucus.  No- wheezing.   Skin: No-   rash or lesions. GI:  No-   heartburn, indigestion, abdominal pain, nausea, vomiting,  GU: . MS:  No-   joint pain or swelling.   Neuro-     nothing unusual Psych:  No- change in mood or  affect. No depression or anxiety.  No memory loss.    Objective:   Physical Exam General- Alert, Oriented, Affect-appropriate, Distress- none acute   calm Skin- rash-none, lesions- none, excoriation- none Lymphadenopathy- none Head- atraumatic            Eyes-+Nearly blind, able to see me only as a large shape.             Ears- Hearing, canals normal            Nose- Clear, No-Septal dev, mucus, polyps, erosion, perforation               Throat- Mallampati II , mucosa clear , drainage- none, tonsils- atrophic Neck- flexible , trachea midline, no stridor , thyroid nl, carotid no bruit Chest - symmetrical excursion , unlabored           Heart/CV- RRR , no murmur , no gallop  , no rub, nl s1 s2                           - JVD- none , edema- none, stasis changes- none, varices- none           Lung- clear to P&A, wheeze- none, cough- none , dullness-none, rub- none  Chest wall-  Abd-  Br/ Gen/ Rectal- Not done, not indicated Extrem- cyanosis- none, clubbing, none, atrophy- none, strength- nl Neuro- grossly intact to observation         Assessment & Plan:

## 2014-05-27 ENCOUNTER — Encounter: Payer: Self-pay | Admitting: Gastroenterology

## 2014-06-10 ENCOUNTER — Non-Acute Institutional Stay: Payer: Medicare Other | Admitting: Nurse Practitioner

## 2014-06-10 ENCOUNTER — Encounter: Payer: Self-pay | Admitting: Nurse Practitioner

## 2014-06-10 DIAGNOSIS — K5732 Diverticulitis of large intestine without perforation or abscess without bleeding: Secondary | ICD-10-CM

## 2014-06-10 DIAGNOSIS — R198 Other specified symptoms and signs involving the digestive system and abdomen: Secondary | ICD-10-CM

## 2014-06-10 DIAGNOSIS — F3289 Other specified depressive episodes: Secondary | ICD-10-CM

## 2014-06-10 DIAGNOSIS — R748 Abnormal levels of other serum enzymes: Secondary | ICD-10-CM

## 2014-06-10 DIAGNOSIS — R194 Change in bowel habit: Secondary | ICD-10-CM

## 2014-06-10 DIAGNOSIS — K5909 Other constipation: Secondary | ICD-10-CM

## 2014-06-10 DIAGNOSIS — F329 Major depressive disorder, single episode, unspecified: Secondary | ICD-10-CM

## 2014-06-10 DIAGNOSIS — N318 Other neuromuscular dysfunction of bladder: Secondary | ICD-10-CM

## 2014-06-10 DIAGNOSIS — K219 Gastro-esophageal reflux disease without esophagitis: Secondary | ICD-10-CM

## 2014-06-10 DIAGNOSIS — D638 Anemia in other chronic diseases classified elsewhere: Secondary | ICD-10-CM

## 2014-06-10 DIAGNOSIS — F411 Generalized anxiety disorder: Secondary | ICD-10-CM

## 2014-06-10 DIAGNOSIS — K5900 Constipation, unspecified: Secondary | ICD-10-CM

## 2014-06-10 DIAGNOSIS — R609 Edema, unspecified: Secondary | ICD-10-CM

## 2014-06-10 DIAGNOSIS — I10 Essential (primary) hypertension: Secondary | ICD-10-CM

## 2014-06-10 NOTE — Assessment & Plan Note (Signed)
Controlled.  

## 2014-06-10 NOTE — Assessment & Plan Note (Signed)
Stable. Continue Omeprazole 20mg and prn Mintox 30ml q4h prn.  

## 2014-06-10 NOTE — Assessment & Plan Note (Signed)
With psychotic features, f/u psychiatry regularly, takes Lorazepam 05mg bid,  Amitriptyline 50mg hs, and Chlordiazepoxide 25mg hs, and Fluphenazine 1mg daily.  Psych hospitalization x2 in the past.  Stabilized.  

## 2014-06-10 NOTE — Assessment & Plan Note (Addendum)
Per patient. Presenting symptoms abd pain which is on and off chronically. Denied nausea, vomiting, or diarrhea. She is afebrile. Will empirical treating: Flagyl 500mg  q8hr and Cipro 500mg  bid po(the patient declined Cipro) x total 10 days along with FloraStor bid. Observe the patient. Update CBC and BMP

## 2014-06-10 NOTE — Assessment & Plan Note (Signed)
Seems better with  Myrbetriq 25mg  daily.

## 2014-06-10 NOTE — Assessment & Plan Note (Signed)
Resolved. 03/02/14 AST 19, ALT 17, Alk phos 115, total bilirubin total 0.2

## 2014-06-10 NOTE — Assessment & Plan Note (Signed)
Not apparent. Takes Furosemide 20mg .

## 2014-06-10 NOTE — Assessment & Plan Note (Signed)
Takes Colace 200mg  bid, prn-Dulcolax suppository I daily, Bisacodyl 5mg  po daily, Feet 4.5Oz PR daily, MOM 30mg  po daily, and MiraLax daily.

## 2014-06-10 NOTE — Progress Notes (Signed)
Patient ID: Jean [name], female   DOB: 1941/04/27, 73 y.o.   MRN: [id number]   Code Status: DNR  Allergies  Allergen Reactions  . Azithromycin   . Clarithromycin   . Doxycycline   . Geodon [Ziprasidone Hydrochloride]   . Lipitor [Atorvastatin Calcium]   . Penicillins   . Sulfonamide Derivatives     Chief Complaint  Patient presents with  . Medical Management of Chronic Issues  . Acute Visit    flare up diverticulitis.     HPI: Patient is a 73 y.o. female seen in the Clinic at [facility] today for evaluation of abd pain-flare up diverticulitis per patient and other chronic medical conditions.  Problem List Items Addressed This Visit   PERIPHERAL EDEMA     Not apparent. Takes Furosemide 45m.     Liver enzyme elevation     Resolved. 03/02/14 AST 19, ALT 17, Alk phos 115, total bilirubin total 0.2     Hypertonicity of bladder     Seems better with  Myrbetriq 228mdaily.       Hypertension     Controlled      GERD (gastroesophageal reflux disease)     Stable. Continue Omeprazole 2097mnd prn Mintox 30m28mh prn.     Diverticulitis of colon (without mention of hemorrhage) - Primary     Per patient. Presenting symptoms abd pain which is on and off chronically. Denied nausea, vomiting, or diarrhea. She is afebrile. Will empirical treating: Flagyl 500mg58mr and Cipro 500mg 50mpo(the patient declined Cipro) x total 10 days along with FloraStor bid. Observe the patient. Update CBC and BMP    DEPRESSION     With psychotic features, f/u psychiatry regularly, takes Lorazepam 05mg b13m Amitriptyline 50mg hs63md Chlordiazepoxide 25mg hs,39m Fluphenazine 1mg daily35mPsych hospitalization x2 in the past.  Stabilized.        Constipation     03/18/14 Dr: Gessner CoCarlean Purlpy: severe diverticulosis was noted in the sigmoid colon, mild diverticulosis was noted in the right colon, the colon mucosa was otherwise normal.   If current regimen is not adequately treating  constipation then she needs to return to see me for more thorough rectal evaluation(?defecation dysfunction).      Change in bowel habits     Takes Colace 200mg bid, 48mDulcolax suppository I daily, Bisacodyl 5mg po dail74mFeet 4.5Oz PR daily, MOM 30mg po dail25mnd MiraLax daily.       ANXIETY     With psychotic features, f/u psychiatry regularly, takes Lorazepam 05mg bid,  Am40mptyline 50mg hs, and C80mdiazepoxide 25mg hs, and Fl18mnazine 1mg daily.  Psyc57mospitalization x2 in the past.  Stabilized.       Anemia of chronic disease     03/02/14 Hgb 11.5        Review of Systems:  Review of Systems  Constitutional: Negative for fever, chills, weight loss, malaise/fatigue and diaphoresis.  HENT: Positive for hearing loss. Negative for congestion, ear discharge, ear pain, nosebleeds, sore throat and tinnitus.   Eyes: Negative for blurred vision, double vision, photophobia, pain, discharge and redness.  Respiratory: Positive for cough. Negative for hemoptysis, sputum production, shortness of breath, wheezing and stridor.   Cardiovascular: Negative for chest pain, palpitations, orthopnea, claudication, leg swelling and PND.  Gastrointestinal: Positive for abdominal pain and constipation. Negative for heartburn, nausea, vomiting, diarrhea, blood in stool and melena.       C/o lower abd pain L>R-worse with walking-had similar abd  pain in the past when fare up( hx of diverticulitis)  Genitourinary: Positive for frequency (adult depends). Negative for dysuria, urgency and flank pain.  Musculoskeletal: Positive for falls and joint pain (left shoudler). Negative for back pain, myalgias and neck pain.  Skin: Negative for itching and rash.  Neurological: Positive for tremors (diffused rigidity. ). Negative for dizziness, tingling, sensory change, speech change, focal weakness, seizures, loss of consciousness, weakness (generalized) and headaches.       Lips smacking  Endo/Heme/Allergies:  Negative for environmental allergies and polydipsia. Does not bruise/bleed easily.  Psychiatric/Behavioral: Positive for depression and memory loss. Negative for suicidal ideas, hallucinations and substance abuse. The patient is nervous/anxious. The patient does not have insomnia.      Past Medical History  Diagnosis Date  . Allergy   . Asthma   . Esophageal reflux   . Dysfunction of eustachian tube   . Irritable bowel syndrome with constipation   . Diverticulosis   . Peptic ulcer disease   . Osteoporosis   . Peripheral neuropathy   . Trigeminal neuralgia     prior injury  . 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 Vitamin D deficiency   . HLD (hyperlipidemia)   . Obesity   . Anxiety   . Depression   . Optic neuritis   . Hemorrhoids   . History of rectal polyps   . HTN (hypertension)   . CAD (coronary artery disease)   . Elevated LFTs   . Hypertonicity of bladder   . Anemia   . Esophageal stricture 2002   Past Surgical History  Procedure Laterality Date  . Appendectomy    . Abdominal hysterectomy    . Tonsillectomy    . Carotid endarterectomy    . Colonoscopy  2008, 2015  . Upper gastrointestinal endoscopy  2002   Social History:   reports that she has never smoked. She does not have any smokeless tobacco history on file. She reports that she does not drink alcohol or use illicit drugs.  Family History  Problem Relation Age of Onset  . Alzheimer's disease Mother   . Coronary artery disease Father   . Heart attack Father   . Heart disease Father   . Ovarian cancer Sister   . Lung cancer Brother   . Heart disease Brother   . Diabetes Neg Hx   . Colon cancer Neg Hx   . Anuerysm Father     AAA  . Anuerysm Brother     AAA    Medications: Patient's Medications  New Prescriptions   No medications on file  Previous Medications   ACETAMINOPHEN (TYLENOL) 325 MG TABLET    Take 650 mg by mouth at  bedtime. And q6hr prn   ALUM & MAG HYDROXIDE-SIMETH (MYLANTA) 323-557-32 MG/5ML SUSPENSION    Take by mouth. Take 59m every 4 hours as needed for indigestion   AMITRIPTYLINE (ELAVIL) 75 MG TABLET    Take 50 mg by mouth at bedtime.    ASPIRIN 81 MG EC TABLET    Take 81 mg by mouth daily.     BISACODYL (BISACODYL) 5 MG EC TABLET    Take 5 mg by mouth. Take one tablet by mouth every day as needed for constipation   BISACODYL (DULCOLAX) 10 MG SUPPOSITORY    Place 10 mg rectally. Insert 1 supp rectally every day as needed for constipation   CALCIUM CARBONATE-VITAMIN D (CALTRATE 600+D)  600-400 MG-UNIT PER TABLET    Take 1 tablet by mouth 2 (two) times daily.     CHLORDIAZEPOXIDE (LIBRIUM) 25 MG CAPSULE    Take 1 capsule (25 mg total) by mouth at bedtime and may repeat dose one time if needed.   FLUPHENAZINE (PROLIXIN) 1 MG TABLET    Take 1 mg by mouth at bedtime.   FLUTICASONE (FLONASE) 50 MCG/ACT NASAL SPRAY    Place 1 spray into both nostrils 2 (two) times daily.   FUROSEMIDE (LASIX) 20 MG TABLET    Take 20 mg by mouth every morning. 1 by mouth every am diuretic for peripheral edema    INULIN (METAMUCIL CLEAR & NATURAL) POWD    Take capful daily   LORAZEPAM (ATIVAN) 0.5 MG TABLET    Take 0.5 mg by mouth 2 (two) times daily.     LUBIPROSTONE (AMITIZA) 8 MCG CAPSULE    Take 1 capsule (8 mcg total) by mouth 2 (two) times daily with a meal.   MAGNESIUM HYDROXIDE (MILK OF MAGNESIA) 400 MG/5ML SUSPENSION    Take by mouth. Take 30 ml by mouth every day as needed for constipation   MULTIPLE VITAMINS-MINERALS (CENTRUM SILVER PO)    Take 1 tablet by mouth.     OMEPRAZOLE (PRILOSEC) 20 MG CAPSULE    Take 20 mg by mouth daily.   POLYETHYLENE GLYCOL POWDER (GLYCOLAX/MIRALAX) POWDER    Take 17 g by mouth as needed.   SENNA-DOCUSATE (SENOKOT S) 8.6-50 MG PER TABLET    2 tablets nightly to help constipation   SODIUM PHOSPHATE (FLEET) 7-19 GM/118ML ENEM    Place 1 enema rectally. One rectally daily as needed    TRAMADOL (ULTRAM) 50 MG TABLET    Take by mouth. Take one every 6 hours as needed for pain  Modified Medications   No medications on file  Discontinued Medications   No medications on file     Physical Exam: Physical Exam  Constitutional: She is oriented to person, place, and time. She appears well-developed and well-nourished. No distress.  HENT:  Head: Normocephalic and atraumatic.  Right Ear: External ear normal.  Left Ear: External ear normal.  Eyes: Conjunctivae and EOM are normal. Pupils are equal, round, and reactive to light.  Neck: Neck supple.  Cardiovascular: Normal rate, regular rhythm and normal heart sounds.   Pulmonary/Chest: Effort normal and breath sounds normal. No respiratory distress. She has no rales. She exhibits no tenderness.  Abdominal: Soft. Bowel sounds are normal. She exhibits no distension. There is no tenderness. There is no rebound and no guarding.  External hemorrhoids-not inflamed.   Musculoskeletal: She exhibits no edema (trace) and no tenderness.       Left shoulder: She exhibits decreased range of motion, bony tenderness, swelling and pain. She exhibits no effusion, no crepitus, no deformity, no laceration, no spasm, normal pulse and normal strength.  Left proximal humerus fx-healed  Neurological: She is alert and oriented to person, place, and time. She has normal reflexes. No cranial nerve deficit.  involuntary lip movements.   Skin: Skin is warm and dry.  Psychiatric: Her mood appears not anxious. Her affect is not angry, not blunt, not labile and not inappropriate. Her speech is not rapid and/or pressured, not delayed, not tangential and not slurred. She is slowed. She is not agitated, not hyperactive, not withdrawn, not actively hallucinating and not combative. Thought content is not paranoid and not delusional. Cognition and memory are impaired. She does not express impulsivity or inappropriate  judgment. She exhibits a depressed mood. She expresses  no homicidal and no suicidal ideation. She expresses no suicidal plans and no homicidal plans. She is communicative. She exhibits abnormal recent memory.  A little anxious/flat affect/slow in responding, no frank hallucinosis.  She is attentive.    Filed Vitals:   06/10/14 1145  BP: 138/72  Pulse: 60  Temp: 98 F (36.7 C)  TempSrc: Tympanic  Resp: 20      Labs reviewed: Basic Metabolic Panel:  Recent Labs  10/08/13 03/02/14  NA 138 139  K 4.4 4.3  BUN 17 17  CREATININE 0.9 0.8  TSH  --  2.48   Liver Function Tests:  Recent Labs  03/02/14  AST 19  ALT 17  ALKPHOS 115   CBC:  Recent Labs  10/08/13 03/02/14  WBC 7.0 8.3  HGB 13.1 11.5*  HCT 38 35*  PLT 344 451*   Lipid Panel: No results found for this basename: CHOL, HDL, LDLCALC, TRIG, CHOLHDL, LDLDIRECT,  in the last 8760 hours   Past Procedures:  09/22/13 CXR no cardiomeagly, pulmonary vascular congestion, or pleural effusion, no inflammatory consolidate or suspicious nodule, minimal atelectasis at eh mid and lower lungs bilaterally.   Assessment/Plan Diverticulitis of colon (without mention of hemorrhage) Per patient. Presenting symptoms abd pain which is on and off chronically. Denied nausea, vomiting, or diarrhea. She is afebrile. Will empirical treating: Flagyl 553m q8hr and Cipro 507mbid po(the patient declined Cipro) x total 10 days along with FloraStor bid. Observe the patient. Update CBC and BMP  Anemia of chronic disease 03/02/14 Hgb 11.5   ANXIETY With psychotic features, f/u psychiatry regularly, takes Lorazepam 0566mid,  Amitriptyline 63m69m, and Chlordiazepoxide 25mg5m and Fluphenazine 1mg d18my.  Psych hospitalization x2 in the past.  Stabilized.     Change in bowel habits Takes Colace 200mg b30mprn-Dulcolax suppository I daily, Bisacodyl 5mg po 51mly, Feet 4.5Oz PR daily, MOM 30mg po 24my, and MiraLax daily.     Constipation 03/18/14 Dr: Gessner CCarlean Purlopy: severe  diverticulosis was noted in the sigmoid colon, mild diverticulosis was noted in the right colon, the colon mucosa was otherwise normal.   If current regimen is not adequately treating constipation then she needs to return to see me for more thorough rectal evaluation(?defecation dysfunction).    DEPRESSION With psychotic features, f/u psychiatry regularly, takes Lorazepam 05mg bid,2mitriptyline 63mg hs, a90mhlordiazepoxide 25mg hs, an70muphenazine 1mg daily.  65mch hospitalization x2 in the past.  Stabilized.      GERD (gastroesophageal reflux disease) Stable. Continue Omeprazole 20mg and prn 25mox 30ml q4h prn. 63mpertension Controlled    Hypertonicity of bladder Seems better with  Myrbetriq 25mg daily.    81mer enzyme elevation Resolved. 03/02/14 AST 19, ALT 17, Alk phos 115, total bilirubin total 0.2   PERIPHERAL EDEMA Not apparent. Takes Furosemide 20mg.     Family63maff Communication: observe the patient. GI referral.   Goals of Care: AL  Labs/tests ordered: CBC and BMP and FOBT x3

## 2014-06-10 NOTE — Assessment & Plan Note (Signed)
03/18/14 Dr: Carlean Purl Colonoscopy: severe diverticulosis was noted in the sigmoid colon, mild diverticulosis was noted in the right colon, the colon mucosa was otherwise normal.   If current regimen is not adequately treating constipation then she needs to return to see me for more thorough rectal evaluation(?defecation dysfunction).

## 2014-06-10 NOTE — Assessment & Plan Note (Signed)
03/02/14 Hgb 11.5

## 2014-06-15 LAB — BASIC METABOLIC PANEL
BUN: 18 mg/dL (ref 4–21)
Creatinine: 0.9 mg/dL (ref 0.5–1.1)
Glucose: 86 mg/dL
Potassium: 4.1 mmol/L (ref 3.4–5.3)
Sodium: 143 mmol/L (ref 137–147)

## 2014-06-15 LAB — CBC AND DIFFERENTIAL
HEMATOCRIT: 37 % (ref 36–46)
Hemoglobin: 11.5 g/dL — AB (ref 12.0–16.0)
PLATELETS: 295 10*3/uL (ref 150–399)
WBC: 5.5 10^3/mL

## 2014-06-16 ENCOUNTER — Ambulatory Visit: Payer: Medicare Other | Admitting: Internal Medicine

## 2014-06-28 ENCOUNTER — Encounter: Payer: Self-pay | Admitting: Internal Medicine

## 2014-06-28 ENCOUNTER — Ambulatory Visit (INDEPENDENT_AMBULATORY_CARE_PROVIDER_SITE_OTHER): Payer: Medicare Other | Admitting: Internal Medicine

## 2014-06-28 VITALS — BP 138/80 | HR 84 | Ht 61.0 in | Wt 144.1 lb

## 2014-06-28 DIAGNOSIS — K5900 Constipation, unspecified: Secondary | ICD-10-CM

## 2014-06-28 DIAGNOSIS — N8184 Pelvic muscle wasting: Secondary | ICD-10-CM

## 2014-06-28 DIAGNOSIS — K5909 Other constipation: Secondary | ICD-10-CM

## 2014-06-28 DIAGNOSIS — M6289 Other specified disorders of muscle: Secondary | ICD-10-CM | POA: Insufficient documentation

## 2014-06-28 DIAGNOSIS — N816 Rectocele: Secondary | ICD-10-CM | POA: Insufficient documentation

## 2014-06-28 NOTE — Progress Notes (Addendum)
   Subjective:    Patient ID: Jean [name], female    DOB: [birth year], 73 y.o.   MRN: [id number]  HPI The patient is here for followup of constipation which is chronic. She had a colonoscopy because of change in bowel habits earlier this year and it showed diverticulosis. She describes an urge to defecate but she can only produced a small incomplete soft stool that is unsatisfying. This has really been a problem for many years. She is on Linzess now, 290 mcg daily. There is a host of when necessary including bisacodyl, and Fleet enema which are not use. She believes she is on Colace and says she wants to stop that. She also takes prunes and milk of magnesia without benefit other than the small soft stool. She denies any urinary incontinence. Has difficulty with urination also. Medications, allergies, past medical history, past surgical history, family history and social history are reviewed and updated in the EMR.   Review of Systems As above    Objective:   Physical Exam The patient is alert and appropriate. She has oral signs of tardive dyskinesia. Abdomen is soft and nontender no hernia Rectal exam shows moderately large anal tags, this is performed in the presence of female staff. There is slightly reduced resting tone, there is no mass. There is no significant stool in the vault. There is a small-to-moderate rectocele. Anal wink was present. Voluntary squeeze is reduced. There is appropriate abdominal contraction but reduced descent and relaxation with simulated defecation.    Assessment & Plan:   1. Chronic constipation   2. Pelvic floor dysfunction   3. Rectocele     It sounds like she has pelvic floor problems. I think she seems to get an urge to defecate cannot produce a good stool. I'm going to schedule her for an anorectal manometry. We'll stop Colace if she is on that. Have her get a Fleet enema every 3 days. Further plans pending the above.   I appreciate the  opportunity to care for this patient. Will copy Donne Hazel.D.

## 2014-06-28 NOTE — Patient Instructions (Addendum)
You have been set up for an anal rectal manometry test at [facility] on October 12th 2015 at 10:30am, arrive at 10:00am. Have nothing to eat or drink after midnight the night before the test.  Stop colace if taking.   See other orders.   I appreciate the opportunity to care for you.

## 2014-07-05 ENCOUNTER — Ambulatory Visit (HOSPITAL_COMMUNITY)
Admission: RE | Admit: 2014-07-05 | Discharge: 2014-07-05 | Disposition: A | Discharge status: Home / Self Care | Payer: Medicare Other | Source: Ambulatory Visit | Attending: General Surgery | Admitting: General Surgery

## 2014-07-05 ENCOUNTER — Encounter (HOSPITAL_COMMUNITY)
Admission: RE | Disposition: A | Discharge status: Home / Self Care | Payer: Self-pay | Source: Ambulatory Visit | Attending: General Surgery

## 2014-07-05 DIAGNOSIS — J4520 Mild intermittent asthma, uncomplicated: Secondary | ICD-10-CM | POA: Insufficient documentation

## 2014-07-05 DIAGNOSIS — E785 Hyperlipidemia, unspecified: Secondary | ICD-10-CM | POA: Insufficient documentation

## 2014-07-05 DIAGNOSIS — K648 Other hemorrhoids: Secondary | ICD-10-CM | POA: Diagnosis not present

## 2014-07-05 DIAGNOSIS — F411 Generalized anxiety disorder: Secondary | ICD-10-CM | POA: Insufficient documentation

## 2014-07-05 DIAGNOSIS — K589 Irritable bowel syndrome without diarrhea: Secondary | ICD-10-CM | POA: Diagnosis not present

## 2014-07-05 DIAGNOSIS — E559 Vitamin D deficiency, unspecified: Secondary | ICD-10-CM | POA: Diagnosis not present

## 2014-07-05 DIAGNOSIS — I10 Essential (primary) hypertension: Secondary | ICD-10-CM | POA: Diagnosis not present

## 2014-07-05 DIAGNOSIS — R194 Change in bowel habit: Secondary | ICD-10-CM | POA: Insufficient documentation

## 2014-07-05 DIAGNOSIS — H6980 Other specified disorders of Eustachian tube, unspecified ear: Secondary | ICD-10-CM | POA: Insufficient documentation

## 2014-07-05 DIAGNOSIS — Z880 Allergy status to penicillin: Secondary | ICD-10-CM | POA: Diagnosis not present

## 2014-07-05 DIAGNOSIS — Z881 Allergy status to other antibiotic agents status: Secondary | ICD-10-CM | POA: Diagnosis not present

## 2014-07-05 DIAGNOSIS — N816 Rectocele: Secondary | ICD-10-CM | POA: Diagnosis not present

## 2014-07-05 DIAGNOSIS — N8184 Pelvic muscle wasting: Secondary | ICD-10-CM | POA: Diagnosis not present

## 2014-07-05 DIAGNOSIS — F329 Major depressive disorder, single episode, unspecified: Secondary | ICD-10-CM | POA: Insufficient documentation

## 2014-07-05 DIAGNOSIS — Z888 Allergy status to other drugs, medicaments and biological substances status: Secondary | ICD-10-CM | POA: Insufficient documentation

## 2014-07-05 DIAGNOSIS — H469 Unspecified optic neuritis: Secondary | ICD-10-CM | POA: Insufficient documentation

## 2014-07-05 DIAGNOSIS — N318 Other neuromuscular dysfunction of bladder: Secondary | ICD-10-CM | POA: Diagnosis not present

## 2014-07-05 DIAGNOSIS — K5730 Diverticulosis of large intestine without perforation or abscess without bleeding: Secondary | ICD-10-CM | POA: Insufficient documentation

## 2014-07-05 DIAGNOSIS — K219 Gastro-esophageal reflux disease without esophagitis: Secondary | ICD-10-CM | POA: Insufficient documentation

## 2014-07-05 DIAGNOSIS — E663 Overweight: Secondary | ICD-10-CM | POA: Diagnosis not present

## 2014-07-05 DIAGNOSIS — M810 Age-related osteoporosis without current pathological fracture: Secondary | ICD-10-CM | POA: Diagnosis not present

## 2014-07-05 DIAGNOSIS — I779 Disorder of arteries and arterioles, unspecified: Secondary | ICD-10-CM | POA: Insufficient documentation

## 2014-07-05 DIAGNOSIS — K5900 Constipation, unspecified: Secondary | ICD-10-CM | POA: Insufficient documentation

## 2014-07-05 HISTORY — PX: ANAL RECTAL MANOMETRY: SHX6358

## 2014-07-05 SURGERY — MANOMETRY, ANORECTAL

## 2014-07-06 ENCOUNTER — Encounter (HOSPITAL_COMMUNITY): Payer: Self-pay | Admitting: General Surgery

## 2014-07-08 ENCOUNTER — Other Ambulatory Visit: Payer: Self-pay | Admitting: *Deleted

## 2014-07-08 MED ORDER — CHLORDIAZEPOXIDE HCL 25 MG PO CAPS: ORAL_CAPSULE | ORAL | Status: DC

## 2014-07-08 NOTE — Telephone Encounter (Signed)
[facility] 

## 2014-07-15 ENCOUNTER — Encounter: Payer: Self-pay | Admitting: Nurse Practitioner

## 2014-07-19 ENCOUNTER — Telehealth: Payer: Self-pay

## 2014-07-19 NOTE — Telephone Encounter (Signed)
Left message for [facility] to call me back to set up appointment.  Left message for [facility] to call me back to discuss the information Dr. Carlean Purl listed below.

## 2014-07-19 NOTE — Telephone Encounter (Signed)
Message copied by [place], Chetara Kropp E on Mon Jul 19, 2014  2:05 PM ------      Message from: Gatha Mayer      Created: Mon Jul 19, 2014 12:58 PM       Let them./patient know that the manometry shows some pelvic floor dysfunction.            We need to refer her to Ile[facility] Roup PT to consider for possible biofeedback therapy.            Stay on q 3 d enema but not necessary to give if having adequate defecation                   ----- Message -----         From: Aaro Meyers E [place], [place]: 07/19/2014  12:00 PM           To: Gatha Mayer, MD            [facility] would like to know since the patient has had her rectal manometry test how long would you like to continue the order for the fleets enema every 3 days.  Also they want to know if they hold it on that 3 day if bowels are "running".   Please advise Sir.       ------

## 2014-07-21 NOTE — Telephone Encounter (Signed)
Spoke with [facility] and told them we are awaiting to set Jean [name] up an appointment for pelvic floor dysfunction and told her the enema orders as well.  I will fax this to her once I get the appointment date/time.

## 2014-07-21 NOTE — Telephone Encounter (Signed)
Left message for [facility] to call me back to discuss orders.

## 2014-07-23 ENCOUNTER — Ambulatory Visit: Payer: Medicare Other | Admitting: Internal Medicine

## 2014-07-23 ENCOUNTER — Telehealth: Payer: Self-pay | Admitting: Internal Medicine

## 2014-07-23 NOTE — Telephone Encounter (Signed)
Unable to reach patient and no voice mail. Will try again later.

## 2014-07-23 NOTE — Telephone Encounter (Signed)
See 10/26 phone note She has pelvic floor dysfunction' We are trying to arrange PT

## 2014-07-23 NOTE — Telephone Encounter (Signed)
Patient given results and recommendations. 

## 2014-07-23 NOTE — Telephone Encounter (Signed)
Patient is calling for manometry results. Please, advise. 

## 2014-07-26 NOTE — Telephone Encounter (Signed)
Left another message for Melody at [facility] to call me back regarding patient's appointment.

## 2014-07-27 NOTE — Telephone Encounter (Signed)
Melody with [facility] called back with appointment. I have spoken to Shawn at [facility] (phone [phone]) who confirms that she has gotten our faxed information regarding patient's appointment at [facility] on 08/12/14 @ 12:00 pm.

## 2014-07-29 ENCOUNTER — Non-Acute Institutional Stay: Payer: Medicare Other | Admitting: Nurse Practitioner

## 2014-07-29 ENCOUNTER — Encounter: Payer: Self-pay | Admitting: Nurse Practitioner

## 2014-07-29 VITALS — BP 120/68 | HR 72 | Temp 98.1°F | Ht 61.0 in | Wt 144.0 lb

## 2014-07-29 DIAGNOSIS — Z299 Encounter for prophylactic measures, unspecified: Secondary | ICD-10-CM

## 2014-07-29 DIAGNOSIS — K219 Gastro-esophageal reflux disease without esophagitis: Secondary | ICD-10-CM

## 2014-07-29 DIAGNOSIS — Z418 Encounter for other procedures for purposes other than remedying health state: Secondary | ICD-10-CM

## 2014-07-29 DIAGNOSIS — K649 Unspecified hemorrhoids: Secondary | ICD-10-CM

## 2014-07-29 DIAGNOSIS — F411 Generalized anxiety disorder: Secondary | ICD-10-CM

## 2014-07-29 DIAGNOSIS — K5900 Constipation, unspecified: Secondary | ICD-10-CM

## 2014-07-29 DIAGNOSIS — F418 Other specified anxiety disorders: Secondary | ICD-10-CM

## 2014-07-29 DIAGNOSIS — K5909 Other constipation: Secondary | ICD-10-CM

## 2014-07-29 DIAGNOSIS — R609 Edema, unspecified: Secondary | ICD-10-CM

## 2014-07-29 DIAGNOSIS — N318 Other neuromuscular dysfunction of bladder: Secondary | ICD-10-CM

## 2014-07-29 DIAGNOSIS — D638 Anemia in other chronic diseases classified elsewhere: Secondary | ICD-10-CM

## 2014-07-29 DIAGNOSIS — I10 Essential (primary) hypertension: Secondary | ICD-10-CM

## 2014-07-29 NOTE — Assessment & Plan Note (Signed)
With psychotic features, f/u psychiatry regularly, takes Lorazepam 05mg bid,  Amitriptyline 50mg hs, and Chlordiazepoxide 25mg hs, and Fluphenazine 1mg daily.  Psych hospitalization x2 in the past.  Stabilized.  

## 2014-07-29 NOTE — Assessment & Plan Note (Signed)
Stable. Continue Omeprazole 20mg and prn Mintox 30ml q4h prn.  

## 2014-07-29 NOTE — Assessment & Plan Note (Signed)
03/02/14 Hgb 11.5 922/15 Hgb 11.5

## 2014-07-29 NOTE — Assessment & Plan Note (Addendum)
03/18/14 Dr: Carlean Purl Colonoscopy: severe diverticulosis was noted in the sigmoid colon, mild diverticulosis was noted in the right colon, the colon mucosa was otherwise normal.   If current regimen is not adequately treating constipation then she needs to return to see me for more thorough rectal evaluation(?defecation dysfunction).   Taking Fleet enema q3days, Linzess 237mcg daily, MOM nightly, and prn Dulcolax PR/PO  07/29/14 f/u GI for further recommendation.

## 2014-07-29 NOTE — Assessment & Plan Note (Signed)
Controlled.  

## 2014-07-29 NOTE — Assessment & Plan Note (Signed)
Not apparent. Takes Furosemide 20mg .

## 2014-07-29 NOTE — Assessment & Plan Note (Signed)
No change, off Myrbetriq

## 2014-08-01 NOTE — Assessment & Plan Note (Signed)
Adequate control with Flonase

## 2014-08-01 NOTE — Assessment & Plan Note (Signed)
Satisfactory control. No intervention needed now

## 2014-08-02 NOTE — Assessment & Plan Note (Signed)
With psychotic features, f/u psychiatry regularly, takes Lorazepam 05mg bid,  Amitriptyline 50mg hs, and Chlordiazepoxide 25mg hs, and Fluphenazine 1mg daily.  Psych hospitalization x2 in the past.  Stabilized.  

## 2014-08-02 NOTE — Progress Notes (Signed)
Patient ID: Jean [name], female   DOB: Sep 15, 1941, 73 y.o.   MRN: [id number]   Code Status: DNR  Allergies  Allergen Reactions  . Azithromycin   . Biaxin [Clarithromycin]   . Clarithromycin   . Doxycycline   . Fosamax [Alendronate Sodium]   . Geodon [Ziprasidone Hydrochloride]   . Lipitor [Atorvastatin Calcium]   . Penicillins   . Pravachol [Pravastatin Sodium]   . Sulfonamide Derivatives   . Zocor [Simvastatin]     Chief Complaint  Patient presents with  . Annual Exam    Comprehensive exam: blood pressure, cholesterol, IBS  . Abdominal Pain    swelling, needs to go to bathroom for BM and can't go    HPI: Patient is a 73 y.o. female seen in the Clinic at [facility] today for evaluation of persisted constipation and chronic medical conditions.  Problem List Items Addressed This Visit    PERIPHERAL EDEMA    Not apparent. Takes Furosemide 20mg .      Hypertonicity of bladder    No change, off Myrbetriq       Hemorrhoids    External x2-not injured.     GERD (gastroesophageal reflux disease)    Stable. Continue Omeprazole 20mg  and prn Mintox 79ml q4h prn.      Essential hypertension    Controlled       Depression with anxiety    With psychotic features, f/u psychiatry regularly, takes Lorazepam 05mg  bid,  Amitriptyline 50mg  hs, and Chlordiazepoxide 25mg  hs, and Fluphenazine 1mg  daily.  Psych hospitalization x2 in the past.  Stabilized.         Constipation - Primary    03/18/14 Dr: Carlean Purl Colonoscopy: severe diverticulosis was noted in the sigmoid colon, mild diverticulosis was noted in the right colon, the colon mucosa was otherwise normal.   If current regimen is not adequately treating constipation then she needs to return to see me for more thorough rectal evaluation(?defecation dysfunction).   Taking Fleet enema q3days, Linzess 275mcg daily, MOM nightly, and prn Dulcolax PR/PO  07/29/14 f/u GI for further recommendation.     Anxiety state      With psychotic features, f/u psychiatry regularly, takes Lorazepam 05mg  bid,  Amitriptyline 50mg  hs, and Chlordiazepoxide 25mg  hs, and Fluphenazine 1mg  daily.  Psych hospitalization x2 in the past.  Stabilized.        Anemia of chronic disease    03/02/14 Hgb 11.5 922/15 Hgb 11.5      Other Visit Diagnoses    Preventive measure        Relevant Orders       DNR (Do Not Resuscitate)       Review of Systems:  Review of Systems  Constitutional: Negative for fever, chills, weight loss, malaise/fatigue and diaphoresis.  HENT: Positive for hearing loss. Negative for congestion, ear discharge, ear pain, nosebleeds, sore throat and tinnitus.   Eyes: Negative for blurred vision, double vision, photophobia, pain, discharge and redness.       Legally blind.   Respiratory: Positive for cough. Negative for hemoptysis, sputum production, shortness of breath, wheezing and stridor.   Cardiovascular: Negative for chest pain, palpitations, orthopnea, claudication, leg swelling and PND.  Gastrointestinal: Positive for constipation. Negative for heartburn, nausea, vomiting, abdominal pain, diarrhea, blood in stool and melena.       C/o abd pain on and off. Not upon today's visit.   Genitourinary: Positive for frequency (adult depends). Negative for dysuria, urgency and flank pain.  Musculoskeletal: Positive for  joint pain (left shoudler) and falls. Negative for myalgias, back pain and neck pain.  Skin: Negative for itching and rash.  Neurological: Positive for tremors (diffused rigidity. ). Negative for dizziness, tingling, sensory change, speech change, focal weakness, seizures, loss of consciousness, weakness (generalized) and headaches.       Lips smacking  Endo/Heme/Allergies: Negative for environmental allergies and polydipsia. Does not bruise/bleed easily.  Psychiatric/Behavioral: Positive for depression and memory loss. Negative for suicidal ideas, hallucinations and substance abuse. The  patient is nervous/anxious. The patient does not have insomnia.      Past Medical History  Diagnosis Date  . Allergy   . Asthma   . Esophageal reflux   . Dysfunction of eustachian tube   . Irritable bowel syndrome with constipation   . Diverticulosis   . Peptic ulcer disease   . Osteoporosis   . Peripheral neuropathy   . Trigeminal neuralgia     prior injury  . 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 Vitamin D deficiency   . HLD (hyperlipidemia)   . Obesity   . Anxiety   . Depression   . Optic neuritis   . Hemorrhoids   . History of rectal polyps   . HTN (hypertension)   . CAD (coronary artery disease)   . Elevated LFTs   . Hypertonicity of bladder   . Anemia   . Esophageal stricture 2002   Past Surgical History  Procedure Laterality Date  . Appendectomy    . Abdominal hysterectomy    . Tonsillectomy    . Carotid endarterectomy    . Colonoscopy  2008, 2015  . Upper gastrointestinal endoscopy  2002  . Anal rectal manometry N/A 07/05/2014    Procedure: ANO RECTAL MANOMETRY;  Surgeon: Leighton Ruff, MD;  Location: [facility];  Service: Endoscopy;  Laterality: N/A;   Social History:   reports that she has never smoked. She has never used smokeless tobacco. She reports that she does not drink alcohol or use illicit drugs.  Family History  Problem Relation Age of Onset  . Alzheimer's disease Mother   . Coronary artery disease Father   . Heart attack Father   . Heart disease Father   . Ovarian cancer Sister   . Lung cancer Brother   . Heart disease Brother   . Diabetes Neg Hx   . Colon cancer Neg Hx   . Anuerysm Father     AAA  . Anuerysm Brother     AAA    Medications: Patient's Medications  New Prescriptions   No medications on file  Previous Medications   ACETAMINOPHEN (TYLENOL) 325 MG TABLET    Take 650 mg by mouth at bedtime. And q6hr prn   ALUM & MAG HYDROXIDE-SIMETH (MYLANTA)  536-644-03 MG/5ML SUSPENSION    Take by mouth. Take 58ml every 4 hours as needed for indigestion   AMITRIPTYLINE (ELAVIL) 75 MG TABLET    Take 50 mg by mouth at bedtime.    ASPIRIN 81 MG EC TABLET    Take 81 mg by mouth daily.     BISACODYL (BISACODYL) 5 MG EC TABLET    Take 5 mg by mouth. Take one tablet by mouth every day as needed for constipation   BISACODYL (DULCOLAX) 10 MG SUPPOSITORY    Place 10 mg rectally. Insert 1 supp rectally every day as needed for constipation   CALCIUM CARBONATE-VITAMIN D (CALTRATE 600+D) 600-400 MG-UNIT PER  TABLET    Take 1 tablet by mouth 2 (two) times daily. 250/125   CHLORDIAZEPOXIDE (LIBRIUM) 25 MG CAPSULE    Take one capsule by mouth at bedtime for anxiety   FLUPHENAZINE (PROLIXIN) 1 MG TABLET    Take 1 mg by mouth at bedtime.   FLUTICASONE (FLONASE) 50 MCG/ACT NASAL SPRAY    Place 1 spray into both nostrils 2 (two) times daily.   FUROSEMIDE (LASIX) 20 MG TABLET    Take 20 mg by mouth every morning. 1 by mouth every am diuretic for peripheral edema    LINACLOTIDE (LINZESS) 290 MCG CAPS CAPSULE    Take 290 mcg by mouth daily.   LORAZEPAM (ATIVAN) 0.5 MG TABLET    Take 0.5 mg by mouth 2 (two) times daily.     MAGNESIUM HYDROXIDE (MILK OF MAGNESIA) 400 MG/5ML SUSPENSION    Take by mouth. Take 30 ml by mouth every day as needed for constipation   MULTIPLE VITAMINS-MINERALS (CENTRUM SILVER PO)    Take 1 tablet by mouth.     OMEPRAZOLE (PRILOSEC) 20 MG CAPSULE    Take 20 mg by mouth daily.   SODIUM PHOSPHATE (FLEET) 7-19 GM/118ML ENEM    Place 1 enema rectally. One rectally daily as needed   TRAMADOL (ULTRAM) 50 MG TABLET    Take by mouth. Take one every 6 hours as needed for pain  Modified Medications   No medications on file  Discontinued Medications   No medications on file     Physical Exam: Physical Exam  Constitutional: She is oriented to person, place, and time. She appears well-developed and well-nourished. No distress.  HENT:  Head: Normocephalic  and atraumatic.  Right Ear: External ear normal.  Left Ear: External ear normal.  Eyes: Conjunctivae and EOM are normal. Pupils are equal, round, and reactive to light.  Neck: Neck supple.  Cardiovascular: Normal rate, regular rhythm and normal heart sounds.   Pulmonary/Chest: Effort normal and breath sounds normal. No respiratory distress. She has no rales. She exhibits no tenderness.  Abdominal: Soft. Bowel sounds are normal. She exhibits no distension. There is no tenderness. There is no rebound and no guarding.  External hemorrhoidsx2-not inflamed.   Genitourinary: Guaiac negative stool.  No hard stools in her rectal vault.   Musculoskeletal: She exhibits no edema (trace) or tenderness.       Left shoulder: She exhibits decreased range of motion, bony tenderness, swelling and pain. She exhibits no effusion, no crepitus, no deformity, no laceration, no spasm, normal pulse and normal strength.  Left proximal humerus fx-healed  Neurological: She is alert and oriented to person, place, and time. She has normal reflexes. No cranial nerve deficit.  involuntary lip movements.   Skin: Skin is warm and dry.  Psychiatric: Her mood appears not anxious. Her affect is not angry, not blunt, not labile and not inappropriate. Her speech is not rapid and/or pressured, not delayed, not tangential and not slurred. She is slowed. She is not agitated, not hyperactive, not withdrawn, not actively hallucinating and not combative. Thought content is not paranoid and not delusional. Cognition and memory are impaired. She does not express impulsivity or inappropriate judgment. She exhibits a depressed mood. She expresses no homicidal and no suicidal ideation. She expresses no suicidal plans and no homicidal plans. She is communicative. She exhibits abnormal recent memory.  A little anxious/flat affect/slow in responding, no frank hallucinosis.  She is attentive.    Filed Vitals:   07/29/14 1622  BP: 120/68  Pulse:  72  Temp: 98.1 F (36.7 C)  TempSrc: Oral  Height: 5\' 1"  (1.549 m)  Weight: 144 lb (65.318 kg)  SpO2: 99%      Labs reviewed: Basic Metabolic Panel:  Recent Labs  10/08/13 03/02/14 06/15/14  NA 138 139 143  K 4.4 4.3 4.1  BUN 17 17 18   CREATININE 0.9 0.8 0.9  TSH  --  2.48  --    Liver Function Tests:  Recent Labs  03/02/14  AST 19  ALT 17  ALKPHOS 115   CBC:  Recent Labs  10/08/13 03/02/14 06/15/14  WBC 7.0 8.3 5.5  HGB 13.1 11.5* 11.5*  HCT 38 35* 37  PLT 344 451* 295   Lipid Panel: No results for input(s): CHOL, HDL, LDLCALC, TRIG, CHOLHDL, LDLDIRECT in the last 8760 hours.   Past Procedures:  09/22/13 CXR no cardiomeagly, pulmonary vascular congestion, or pleural effusion, no inflammatory consolidate or suspicious nodule, minimal atelectasis at eh mid and lower lungs bilaterally.   Assessment/Plan Constipation 03/18/14 Dr: Carlean Purl Colonoscopy: severe diverticulosis was noted in the sigmoid colon, mild diverticulosis was noted in the right colon, the colon mucosa was otherwise normal.   If current regimen is not adequately treating constipation then she needs to return to see me for more thorough rectal evaluation(?defecation dysfunction).   Taking Fleet enema q3days, Linzess 244mcg daily, MOM nightly, and prn Dulcolax PR/PO  07/29/14 f/u GI for further recommendation.   GERD (gastroesophageal reflux disease) Stable. Continue Omeprazole 20mg  and prn Mintox 37ml q4h prn.    PERIPHERAL EDEMA Not apparent. Takes Furosemide 20mg .    Depression with anxiety With psychotic features, f/u psychiatry regularly, takes Lorazepam 05mg  bid,  Amitriptyline 50mg  hs, and Chlordiazepoxide 25mg  hs, and Fluphenazine 1mg  daily.  Psych hospitalization x2 in the past.  Stabilized.       Essential hypertension Controlled     Hypertonicity of bladder No change, off Myrbetriq     Anemia of chronic disease 03/02/14 Hgb 11.5 922/15 Hgb  11.5   Hemorrhoids External x2-not injured.   Anxiety state With psychotic features, f/u psychiatry regularly, takes Lorazepam 05mg  bid,  Amitriptyline 50mg  hs, and Chlordiazepoxide 25mg  hs, and Fluphenazine 1mg  daily.  Psych hospitalization x2 in the past.  Stabilized.        Family/ Staff Communication: observe the patient. GI f/u  Goals of Care: AL  Labs/tests ordered: none.

## 2014-08-02 NOTE — Assessment & Plan Note (Signed)
External x2-not injured.

## 2014-08-06 ENCOUNTER — Encounter: Payer: Self-pay | Admitting: Physician Assistant

## 2014-08-06 ENCOUNTER — Ambulatory Visit (INDEPENDENT_AMBULATORY_CARE_PROVIDER_SITE_OTHER): Payer: Medicare Other | Admitting: Physician Assistant

## 2014-08-06 ENCOUNTER — Other Ambulatory Visit (INDEPENDENT_AMBULATORY_CARE_PROVIDER_SITE_OTHER): Payer: Medicare Other

## 2014-08-06 VITALS — BP 120/64 | HR 80 | Ht 60.05 in | Wt 142.0 lb

## 2014-08-06 DIAGNOSIS — N8184 Pelvic muscle wasting: Secondary | ICD-10-CM

## 2014-08-06 DIAGNOSIS — K5909 Other constipation: Secondary | ICD-10-CM | POA: Insufficient documentation

## 2014-08-06 DIAGNOSIS — M6289 Other specified disorders of muscle: Secondary | ICD-10-CM

## 2014-08-06 LAB — URINALYSIS, ROUTINE W REFLEX MICROSCOPIC
Bilirubin Urine: NEGATIVE
Ketones, ur: NEGATIVE
LEUKOCYTES UA: NEGATIVE
NITRITE: NEGATIVE
Specific Gravity, Urine: 1.01 (ref 1.000–1.030)
Total Protein, Urine: NEGATIVE
Urine Glucose: NEGATIVE
Urobilinogen, UA: 0.2 (ref 0.0–1.0)
pH: 7.5 (ref 5.0–8.0)

## 2014-08-06 LAB — URINALYSIS
Bilirubin Urine: NEGATIVE
Ketones, ur: NEGATIVE
Leukocytes, UA: NEGATIVE
NITRITE: NEGATIVE
Specific Gravity, Urine: 1.01 (ref 1.000–1.030)
TOTAL PROTEIN, URINE-UPE24: NEGATIVE
URINE GLUCOSE: NEGATIVE
Urobilinogen, UA: 0.2 (ref 0.0–1.0)
pH: 7.5 (ref 5.0–8.0)

## 2014-08-06 NOTE — Progress Notes (Addendum)
Patient ID: Jean [name], female   DOB: April 22, 1941, 73 y.o.   MRN: [id number]     History of Present Illness: His follow-up for this 73 year old female with chronic constipation. Currently she is on Linzess 290 g daily and an enema every third day. She states she is still having very small bowel movements and feels distended. At her last visit she was noted to have reduced rectal sphincter tone and a rectocele. She was sent for anal rectal manometry which revealed some pelvic floor dysfunction. The patient is here with continued complaints of constipation. A referral had been made to Ileana Roup of physical therapy at [facility] for some pelvic floor strengthening but the patient was not aware of this.She states she has had some mild burning on urination and feels for the past several days that she has to strain to start her urinary flow. She denies fever or chills.   Past Medical History  Diagnosis Date  . Allergy   . Asthma   . Esophageal reflux   . Dysfunction of eustachian tube   . Irritable bowel syndrome with constipation   . Diverticulosis   . Peptic ulcer disease   . Osteoporosis   . Peripheral neuropathy   . Trigeminal neuralgia     prior injury  . 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 Vitamin D deficiency   . HLD (hyperlipidemia)   . Obesity   . Anxiety   . Depression   . Optic neuritis   . Hemorrhoids   . History of rectal polyps   . HTN (hypertension)   . CAD (coronary artery disease)   . Elevated LFTs   . Hypertonicity of bladder   . Anemia   . Esophageal stricture 2002    Past Surgical History  Procedure Laterality Date  . Appendectomy    . Abdominal hysterectomy    . Tonsillectomy    . Carotid endarterectomy    . Colonoscopy  2008, 2015  . Upper gastrointestinal endoscopy  2002  . Anal rectal manometry N/A 07/05/2014    Procedure: ANO RECTAL MANOMETRY;  Surgeon: Leighton Ruff, MD;  Location:  [facility];  Service: Endoscopy;  Laterality: N/A;   Family History  Problem Relation Age of Onset  . Alzheimer's disease Mother   . Coronary artery disease Father   . Heart attack Father   . Heart disease Father   . Ovarian cancer Sister   . Lung cancer Brother   . Heart disease Brother   . Diabetes Neg Hx   . Colon cancer Neg Hx   . Anuerysm Father     AAA  . Anuerysm Brother     AAA   History  Substance Use Topics  . Smoking status: Never Smoker   . Smokeless tobacco: Never Used  . Alcohol Use: No   Current Outpatient Prescriptions  Medication Sig Dispense Refill  . acetaminophen (TYLENOL) 325 MG tablet Take 650 mg by mouth at bedtime. And q6hr prn    . alum & mag hydroxide-simeth (MYLANTA) 200-200-20 MG/5ML suspension Take by mouth. Take 26ml every 4 hours as needed for indigestion    . amitriptyline (ELAVIL) 75 MG tablet Take 50 mg by mouth at bedtime.     Marland Kitchen aspirin 81 MG EC tablet Take 81 mg by mouth daily.      . bisacodyl (BISACODYL) 5 MG EC tablet Take 5 mg by mouth. Take one tablet by mouth  every day as needed for constipation    . bisacodyl (DULCOLAX) 10 MG suppository Place 10 mg rectally. Insert 1 supp rectally every day as needed for constipation    . Calcium Carbonate-Vitamin D (CALTRATE 600+D) 600-400 MG-UNIT per tablet Take 1 tablet by mouth 2 (two) times daily. 250/125    . chlordiazePOXIDE (LIBRIUM) 25 MG capsule Take one capsule by mouth at bedtime for anxiety (Patient taking differently: Take one capsule by mouth at bedtime for anxiety. Take one daily as needed) 30 capsule 5  . fluPHENAZine (PROLIXIN) 1 MG tablet Take 1 mg by mouth at bedtime.    . fluticasone (FLONASE) 50 MCG/ACT nasal spray Place 1 spray into both nostrils 2 (two) times daily.    . furosemide (LASIX) 20 MG tablet Take 20 mg by mouth every morning. 1 by mouth every am diuretic for peripheral edema     . Linaclotide (LINZESS) 290 MCG CAPS capsule Take 290 mcg by mouth daily.    Marland Kitchen LORazepam  (ATIVAN) 0.5 MG tablet Take 0.5 mg by mouth 2 (two) times daily.      . magnesium hydroxide (MILK OF MAGNESIA) 400 MG/5ML suspension Take by mouth. Take 30 ml by mouth every day as needed for constipation    . Multiple Vitamins-Minerals (CENTRUM SILVER PO) Take 1 tablet by mouth.      Marland Kitchen omeprazole (PRILOSEC) 20 MG capsule Take 20 mg by mouth daily.    . sodium phosphate (FLEET) 7-19 GM/118ML ENEM Place 1 enema rectally. One rectally daily as needed    . traMADol (ULTRAM) 50 MG tablet Take by mouth. Take one every 6 hours as needed for pain     Current Facility-Administered Medications  Medication Dose Route Frequency Provider Last Rate Last Dose  . zoster vaccine live (PF) (ZOSTAVAX) injection 19,400 Units  0.65 mL Subcutaneous Once Neena Rhymes, MD       Allergies  Allergen Reactions  . Azithromycin   . Biaxin [Clarithromycin]   . Clarithromycin   . Doxycycline   . Fosamax [Alendronate Sodium]   . Geodon [Ziprasidone Hydrochloride]   . Lipitor [Atorvastatin Calcium]   . Penicillins   . Pravachol [Pravastatin Sodium]   . Sulfonamide Derivatives   . Zocor [Simvastatin]       Review of Systems: Gen: Denies any fever, chills, sweats, anorexia, fatigue, weakness, malaise, weight loss, and sleep disorder CV: Denies chest pain, angina, palpitations, syncope, orthopnea, PND, peripheral edema, and claudication. Resp: Denies dyspnea at rest, dyspnea with exercise, cough, sputum, wheezing, coughing up blood, and pleurisy. GI: Denies vomiting blood, jaundice, and fecal incontinence.   Denies dysphagia or odynophagia. GU : Denies urinary burning, blood in urine, urinary frequency, urinary hesitancy, nocturnal urination, and urinary incontinence. MS: Denies joint pain, limitation of movement, and swelling, stiffness, low back pain, extremity pain. Denies muscle weakness, cramps, atrophy.  Derm: Denies rash, itching, dry skin, hives, moles, warts, or unhealing ulcers.  Psych: Denies  depression, anxiety, memory loss, suicidal ideation, hallucinations, paranoia, and confusion. Heme: Denies bruising, bleeding, and enlarged lymph nodes. Neuro:  Denies any headaches, dizziness, paresthesia Endo:  Denies any problems with DM, thyroid, adrenal    Physical Exam: General: Pleasant, well developed ,female in no acute distress Head: Normocephalic and atraumatic. Lip smacking Eyes:  sclerae anicteric, conjunctiva pink  Ears: Normal auditory acuity Lungs: Clear throughout to auscultation Heart: Regular rate and rhythm Abdomen: Soft, non distended, mild diffuse tenderness but no rebound or guarding,No masses, no hepatomegaly. Normal bowel sounds Musculoskeletal: Symmetrical  with no gross deformities  Extremities: No edema  Neurological: Alert oriented x 4, grossly nonfocal Psychological:  Alert and cooperative. Normal mood and affect  Assessment and Recommendations: Patient is a 73 year old female with chronic constipation and pelvic floor dysfunction here for follow-up.She has been instructed to continue her current dose of Linzess and continue a Fleet's enema every third day. She has been scheduled to start physical therapy with Ileana Roup at [facility] next week. A urinalysis and urine culture and sensitivity have been ordered due to her complaints of dysuria. She will follow up in 2-3 months, sooner if needed.   Delvecchio Madole, Deloris Ping 08/06/2014,   Agree w/ Ms. Kaimen Peine's note and mangement.

## 2014-08-06 NOTE — Patient Instructions (Signed)
Keep your appointment with [facility] on 08/12/2014 at 12pm  Shawn at [facility] is aware of this appointment   Continue Linzess Use Enemas every three days as needed  Go to the basements for urin test today

## 2014-08-10 ENCOUNTER — Telehealth: Payer: Self-pay | Admitting: Physician Assistant

## 2014-08-10 NOTE — Telephone Encounter (Signed)
Left detailed message for Maudie Mercury at [facility] that I was faxing office notes over for patient's upcoming visit.

## 2014-08-18 ENCOUNTER — Ambulatory Visit: Payer: Medicare Other | Admitting: Internal Medicine

## 2014-10-20 ENCOUNTER — Encounter: Payer: Self-pay | Admitting: Nurse Practitioner

## 2014-10-20 NOTE — Progress Notes (Signed)
This encounter was created in error - please disregard.

## 2014-10-27 ENCOUNTER — Telehealth: Payer: Self-pay | Admitting: Internal Medicine

## 2014-10-27 NOTE — Telephone Encounter (Signed)
Please advise Sir? 

## 2014-10-28 MED ORDER — SENNA 8.6 MG PO TABS: 2.0000 | ORAL_TABLET | Freq: Every day | ORAL | Status: DC

## 2014-10-28 NOTE — Telephone Encounter (Signed)
Informed Tywan what Dr Carlean Purl advised.  [facility] , fax # [phone] per her request.

## 2014-10-28 NOTE — Telephone Encounter (Signed)
Try SenoKot OTC 2 each night

## 2014-12-02 ENCOUNTER — Other Ambulatory Visit: Payer: Self-pay | Admitting: Nurse Practitioner

## 2015-01-21 ENCOUNTER — Encounter: Payer: Self-pay | Admitting: Nurse Practitioner

## 2015-01-21 ENCOUNTER — Non-Acute Institutional Stay: Payer: Medicare Other | Admitting: Nurse Practitioner

## 2015-01-21 DIAGNOSIS — I10 Essential (primary) hypertension: Secondary | ICD-10-CM

## 2015-01-21 DIAGNOSIS — F411 Generalized anxiety disorder: Secondary | ICD-10-CM

## 2015-01-21 DIAGNOSIS — S83207A Unspecified tear of unspecified meniscus, current injury, left knee, initial encounter: Secondary | ICD-10-CM | POA: Insufficient documentation

## 2015-01-21 DIAGNOSIS — S83209A Unspecified tear of unspecified meniscus, current injury, unspecified knee, initial encounter: Secondary | ICD-10-CM

## 2015-01-21 DIAGNOSIS — K5900 Constipation, unspecified: Secondary | ICD-10-CM

## 2015-01-21 DIAGNOSIS — H9313 Tinnitus, bilateral: Secondary | ICD-10-CM | POA: Diagnosis not present

## 2015-01-21 DIAGNOSIS — J302 Other seasonal allergic rhinitis: Secondary | ICD-10-CM

## 2015-01-21 DIAGNOSIS — M79605 Pain in left leg: Secondary | ICD-10-CM

## 2015-01-21 DIAGNOSIS — K5909 Other constipation: Secondary | ICD-10-CM

## 2015-01-21 DIAGNOSIS — J3089 Other allergic rhinitis: Secondary | ICD-10-CM

## 2015-01-21 DIAGNOSIS — R609 Edema, unspecified: Secondary | ICD-10-CM | POA: Diagnosis not present

## 2015-01-21 DIAGNOSIS — J309 Allergic rhinitis, unspecified: Secondary | ICD-10-CM | POA: Diagnosis not present

## 2015-01-21 NOTE — Assessment & Plan Note (Signed)
Not apparent. Takes Furosemide 20mg , update CMP

## 2015-01-21 NOTE — Assessment & Plan Note (Signed)
03/18/14 Dr: Carlean Purl Colonoscopy: severe diverticulosis was noted in the sigmoid colon, mild diverticulosis was noted in the right colon, the colon mucosa was otherwise normal.   If current regimen is not adequately treating constipation then she needs to return to see me for more thorough rectal evaluation(?defecation dysfunction).   Taking Linzess 272mcg daily, MOM nightly, and prn Dulcolax PR/PO  Seeing GI regularly.

## 2015-01-21 NOTE — Assessment & Plan Note (Signed)
With psychotic features, f/u psychiatry regularly, takes Lorazepam 05mg  bid,  Amitriptyline 50mg  hs, and Chlordiazepoxide 25mg  hs and daily prn, and Fluphenazine 1mg  daily.  Psych hospitalization x2 in the past.  Stabilized.

## 2015-01-21 NOTE — Assessment & Plan Note (Signed)
Managed with Flonase bid.

## 2015-01-21 NOTE — Progress Notes (Signed)
Patient ID: Jean [name], female   DOB: 05-Dec-1940, 74 y.o.   MRN: [id number]   Code Status: DNR  Allergies  Allergen Reactions  . Azithromycin   . Biaxin [Clarithromycin]   . Clarithromycin   . Doxycycline   . Fosamax [Alendronate Sodium]   . Geodon [Ziprasidone Hydrochloride]   . Lipitor [Atorvastatin Calcium]   . Penicillins   . Pravachol [Pravastatin Sodium]   . Sulfonamide Derivatives   . Zocor [Simvastatin]     Chief Complaint  Patient presents with  . Medical Management of Chronic Issues  . Acute Visit    left hip/thigh/knee pain since fall 01/13/15, ringing in ears    HPI: Patient is a 74 y.o. female seen in the [facility] at [facility] today for evaluation of left hip/thigh/knee pain with ambulation since her fall 01/13/15, ringing in ears,  and other chronic medical conditions.  Problem List Items Addressed This Visit    Anxiety state    With psychotic features, f/u psychiatry regularly, takes Lorazepam 05mg  bid,  Amitriptyline 50mg  hs, and Chlordiazepoxide 25mg  hs and daily prn, and Fluphenazine 1mg  daily.  Psych hospitalization x2 in the past.  Stabilized.          Seasonal and perennial allergic rhinitis    Managed with Flonase bid.       PERIPHERAL EDEMA    Not apparent. Takes Furosemide 20mg , update CMP      Essential hypertension    Controlled        Constipation    03/18/14 Dr: Carlean Purl Colonoscopy: severe diverticulosis was noted in the sigmoid colon, mild diverticulosis was noted in the right colon, the colon mucosa was otherwise normal.   If current regimen is not adequately treating constipation then she needs to return to see me for more thorough rectal evaluation(?defecation dysfunction).   Taking Linzess 282mcg daily, MOM nightly, and prn Dulcolax PR/PO  Seeing GI regularly.        Tinnitus of both ears - Primary   Left leg pain    Sustained from the fall 01/13/15. Pain in her left hip, thigh, and knee with ambulation. Negative  straight leg raise. Will X-ray pelvis, left hip, femur, and knee to r/o fxs. Tylenol prn in addition to 650mg  qhs available to her which is effective. CBC and UA C/S as well. Observe the patient.          Review of Systems:  Review of Systems  Constitutional: Negative for fever, chills and diaphoresis.  HENT: Positive for hearing loss and tinnitus. Negative for congestion, ear discharge, ear pain, nosebleeds and sore throat.   Eyes: Negative for photophobia, pain, discharge and redness.       Legally blind.   Respiratory: Positive for cough. Negative for shortness of breath, wheezing and stridor.   Cardiovascular: Negative for chest pain, palpitations and leg swelling.  Gastrointestinal: Positive for constipation. Negative for nausea, vomiting, abdominal pain, diarrhea and blood in stool.       C/o abd pain on and off. Not upon today's visit.   Endocrine: Negative for polydipsia.  Genitourinary: Positive for frequency (adult depends). Negative for dysuria, urgency and flank pain.  Musculoskeletal: Positive for gait problem. Negative for myalgias, back pain and neck pain.       C/o left hip, thigh, and knee pain with walking since her fall 01/13/15  Skin: Negative for rash.  Allergic/Immunologic: Negative for environmental allergies.  Neurological: Positive for tremors (diffused rigidity. ). Negative for dizziness, seizures, weakness (generalized) and  headaches.       Lips smacking  Hematological: Does not bruise/bleed easily.  Psychiatric/Behavioral: Negative for suicidal ideas and hallucinations. The patient is nervous/anxious.     Past Medical History  Diagnosis Date  . Allergy   . Asthma   . Esophageal reflux   . Dysfunction of eustachian tube   . Irritable bowel syndrome with constipation   . Diverticulosis   . Peptic ulcer disease   . Osteoporosis   . Peripheral neuropathy   . Trigeminal neuralgia     prior injury  . Visual loss, bilateral     left- retinal artery  occulusion vs  optic neuritis, 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  . Vitamin D deficiency   . HLD (hyperlipidemia)   . Obesity   . Anxiety   . Depression   . Optic neuritis   . Hemorrhoids   . History of rectal polyps   . HTN (hypertension)   . CAD (coronary artery disease)   . Elevated LFTs   . Hypertonicity of bladder   . Anemia   . Esophageal stricture 2002   Past Surgical History  Procedure Laterality Date  . Appendectomy    . Abdominal hysterectomy    . Tonsillectomy    . Carotid endarterectomy    . Colonoscopy  2008, 2015  . Upper gastrointestinal endoscopy  2002  . Anal rectal manometry N/A 07/05/2014    Procedure: ANO RECTAL MANOMETRY;  Surgeon: Leighton Ruff, MD;  Location: [facility];  Service: Endoscopy;  Laterality: N/A;   Social History:   reports that she has never smoked. She has never used smokeless tobacco. She reports that she does not drink alcohol or use illicit drugs.  Family History  Problem Relation Age of Onset  . Alzheimer's disease Mother   . Coronary artery disease Father   . Heart attack Father   . Heart disease Father   . Ovarian cancer Sister   . Lung cancer Brother   . Heart disease Brother   . Diabetes Neg Hx   . Colon cancer Neg Hx   . Anuerysm Father     AAA  . Anuerysm Brother     AAA    Medications: Patient's Medications  New Prescriptions   No medications on file  Previous Medications   ACETAMINOPHEN (TYLENOL) 325 MG TABLET    Take 650 mg by mouth at bedtime. And q6hr prn   ALUM & MAG HYDROXIDE-SIMETH (MYLANTA) 233-007-62 MG/5ML SUSPENSION    Take by mouth. Take 64ml every 4 hours as needed for indigestion   AMITRIPTYLINE (ELAVIL) 75 MG TABLET    Take 50 mg by mouth at bedtime.    ASPIRIN 81 MG EC TABLET    Take 81 mg by mouth daily.     BISACODYL (BISACODYL) 5 MG EC TABLET    Take 5 mg by mouth. Take one tablet by mouth every day as needed for constipation   BISACODYL (DULCOLAX) 10 MG SUPPOSITORY    Place 10 mg rectally. Insert 1 supp  rectally every day as needed for constipation   CALCIUM CARBONATE-VITAMIN D (CALTRATE 600+D) 600-400 MG-UNIT PER TABLET    Take 1 tablet by mouth 2 (two) times daily. 250/125   CHLORDIAZEPOXIDE (LIBRIUM) 25 MG CAPSULE    Take one capsule by mouth at bedtime for anxiety   FLUPHENAZINE (PROLIXIN) 1 MG TABLET    Take 1 mg by mouth at bedtime.   FLUTICASONE (FLONASE) 50 MCG/ACT NASAL SPRAY    Place 1 spray into both nostrils  2 (two) times daily.   FUROSEMIDE (LASIX) 20 MG TABLET    Take 20 mg by mouth every morning. 1 by mouth every am diuretic for peripheral edema    LORAZEPAM (ATIVAN) 0.5 MG TABLET    Take 0.5 mg by mouth 2 (two) times daily.     MAGNESIUM HYDROXIDE (MILK OF MAGNESIA) 400 MG/5ML SUSPENSION    Take by mouth. Take 30 ml by mouth every day as needed for constipation   MULTIPLE VITAMINS-MINERALS (CENTRUM SILVER PO)    Take 1 tablet by mouth.     OMEPRAZOLE (PRILOSEC) 20 MG CAPSULE    Take 20 mg by mouth daily.   SENNA (SENOKOT) 8.6 MG TABS TABLET    Take 2 tablets (17.2 mg total) by mouth at bedtime.   SODIUM PHOSPHATE (FLEET) 7-19 GM/118ML ENEM    Place 1 enema rectally. One rectally daily as needed  Modified Medications   No medications on file  Discontinued Medications   No medications on file     Physical Exam: Physical Exam  Constitutional: She is oriented to person, place, and time. She appears well-developed and well-nourished. No distress.  HENT:  Head: Normocephalic and atraumatic.  Right Ear: External ear normal. No lacerations. No drainage, swelling or tenderness. No foreign bodies. No mastoid tenderness. Tympanic membrane is not injected, not scarred, not perforated, not erythematous, not retracted and not bulging. No middle ear effusion. No hemotympanum. Decreased hearing is noted.  Left Ear: External ear normal. No lacerations. No drainage, swelling or tenderness. No foreign bodies. No mastoid tenderness. Tympanic membrane is not injected, not scarred, not  perforated, not erythematous, not retracted and not bulging.  No middle ear effusion. No hemotympanum. Decreased hearing is noted.  Eyes: Conjunctivae and EOM are normal. Pupils are equal, round, and reactive to light.  Neck: Neck supple.  Cardiovascular: Normal rate, regular rhythm and normal heart sounds.   Pulmonary/Chest: Effort normal and breath sounds normal. No respiratory distress. She has no rales. She exhibits no tenderness.  Abdominal: Soft. Bowel sounds are normal. She exhibits no distension. There is no tenderness. There is no rebound and no guarding.  External hemorrhoidsx2-not inflamed.   Genitourinary: Guaiac negative stool.  No hard stools in her rectal vault.   Musculoskeletal: She exhibits no edema (trace) or tenderness.       Left shoulder: She exhibits decreased range of motion, bony tenderness, swelling and pain. She exhibits no effusion, no crepitus, no deformity, no laceration, no spasm, normal pulse and normal strength.  Left proximal humerus fx-healed  Neurological: She is alert and oriented to person, place, and time. She has normal reflexes. No cranial nerve deficit.  involuntary lip movements.   Skin: Skin is warm and dry.  Psychiatric: Her mood appears not anxious. Her affect is not angry, not blunt, not labile and not inappropriate. Her speech is not rapid and/or pressured, not delayed, not tangential and not slurred. She is slowed. She is not agitated, not hyperactive, not withdrawn, not actively hallucinating and not combative. Thought content is not paranoid and not delusional. Cognition and memory are impaired. She does not express impulsivity or inappropriate judgment. She exhibits a depressed mood. She expresses no homicidal and no suicidal ideation. She expresses no suicidal plans and no homicidal plans. She is communicative. She exhibits abnormal recent memory.  A little anxious/flat affect/slow in responding, no frank hallucinosis.  She is attentive.     Filed Vitals:   01/20/15 1042  BP: 131/75  Pulse: 72  Temp:  98.4 F (36.9 C)  TempSrc: Tympanic  Resp: 18      Labs reviewed: Basic Metabolic Panel:  Recent Labs  03/02/14 06/15/14  NA 139 143  K 4.3 4.1  BUN 17 18  CREATININE 0.8 0.9  TSH 2.48  --    Liver Function Tests:  Recent Labs  03/02/14  AST 19  ALT 17  ALKPHOS 115   No results for input(s): LIPASE, AMYLASE in the last 8760 hours. No results for input(s): AMMONIA in the last 8760 hours. CBC:  Recent Labs  03/02/14 06/15/14  WBC 8.3 5.5  HGB 11.5* 11.5*  HCT 35* 37  PLT 451* 295   Lipid Panel: No results for input(s): CHOL, HDL, LDLCALC, TRIG, CHOLHDL, LDLDIRECT in the last 8760 hours.   Past Procedures:  09/22/13 CXR no cardiomeagly, pulmonary vascular congestion, or pleural effusion, no inflammatory consolidate or suspicious nodule, minimal atelectasis at eh mid and lower lungs bilaterally.   Assessment/Plan Left leg pain Sustained from the fall 01/13/15. Pain in her left hip, thigh, and knee with ambulation. Negative straight leg raise. Will X-ray pelvis, left hip, femur, and knee to r/o fxs. Tylenol prn in addition to 650mg  qhs available to her which is effective. CBC and UA C/S as well. Observe the patient.    Anxiety state With psychotic features, f/u psychiatry regularly, takes Lorazepam 05mg  bid,  Amitriptyline 50mg  hs, and Chlordiazepoxide 25mg  hs and daily prn, and Fluphenazine 1mg  daily.  Psych hospitalization x2 in the past.  Stabilized.       Constipation 03/18/14 Dr: Carlean Purl Colonoscopy: severe diverticulosis was noted in the sigmoid colon, mild diverticulosis was noted in the right colon, the colon mucosa was otherwise normal.   If current regimen is not adequately treating constipation then she needs to return to see me for more thorough rectal evaluation(?defecation dysfunction).   Taking Linzess 238mcg daily, MOM nightly, and prn Dulcolax PR/PO  Seeing GI regularly.      PERIPHERAL EDEMA Not apparent. Takes Furosemide 20mg , update CMP   Essential hypertension Controlled     Seasonal and perennial allergic rhinitis Managed with Flonase bid.      Family/ Staff Communication: observe the patient.   Goals of Care: AL  Labs/tests ordered: X-ray pelvis, left hip, left femur, left knee, CBC, CMP, UA C/S

## 2015-01-21 NOTE — Assessment & Plan Note (Signed)
Controlled.  

## 2015-01-21 NOTE — Assessment & Plan Note (Addendum)
Sustained from the fall 01/13/15. Pain in her left hip, thigh, and knee with ambulation. Negative straight leg raise. Will X-ray pelvis, left hip, femur, and knee to r/o fxs. Tylenol prn in addition to 650mg  qhs available to her which is effective. CBC and UA C/S as well. Observe the patient.

## 2015-01-22 LAB — BASIC METABOLIC PANEL
BUN: 15 mg/dL (ref 4–21)
Creatinine: 0.9 mg/dL (ref 0.5–1.1)
Glucose: 96 mg/dL
Potassium: 4.2 mmol/L (ref 3.4–5.3)
SODIUM: 139 mmol/L (ref 137–147)

## 2015-01-22 LAB — CBC AND DIFFERENTIAL
HCT: 38 % (ref 36–46)
HEMOGLOBIN: 12.5 g/dL (ref 12.0–16.0)
Platelets: 351 10*3/uL (ref 150–399)
WBC: 7.1 10^3/mL

## 2015-01-22 LAB — HEPATIC FUNCTION PANEL
ALT: 16 U/L (ref 7–35)
AST: 22 U/L (ref 13–35)
Alkaline Phosphatase: 141 U/L — AB (ref 25–125)
Bilirubin, Total: 0.3 mg/dL

## 2015-01-22 LAB — TSH: TSH: 1.99 u[IU]/mL (ref 0.41–5.90)

## 2015-01-27 ENCOUNTER — Encounter: Payer: Self-pay | Admitting: Nurse Practitioner

## 2015-01-27 ENCOUNTER — Non-Acute Institutional Stay: Payer: Medicare Other | Admitting: Nurse Practitioner

## 2015-01-27 DIAGNOSIS — K5909 Other constipation: Secondary | ICD-10-CM

## 2015-01-27 DIAGNOSIS — F411 Generalized anxiety disorder: Secondary | ICD-10-CM | POA: Diagnosis not present

## 2015-01-27 DIAGNOSIS — I10 Essential (primary) hypertension: Secondary | ICD-10-CM

## 2015-01-27 DIAGNOSIS — K219 Gastro-esophageal reflux disease without esophagitis: Secondary | ICD-10-CM

## 2015-01-27 DIAGNOSIS — M79605 Pain in left leg: Secondary | ICD-10-CM

## 2015-01-27 DIAGNOSIS — R609 Edema, unspecified: Secondary | ICD-10-CM

## 2015-01-27 DIAGNOSIS — N390 Urinary tract infection, site not specified: Secondary | ICD-10-CM

## 2015-01-27 NOTE — Assessment & Plan Note (Signed)
Stable. Continue Omeprazole 20mg  and prn Mintox 28ml q4h prn.

## 2015-01-27 NOTE — Assessment & Plan Note (Signed)
With psychotic features, f/u psychiatry regularly, takes Lorazepam 05mg  bid,  Amitriptyline 50mg  hs, and Chlordiazepoxide 25mg  hs and daily prn, and Fluphenazine 1mg  daily.  Psych hospitalization x2 in the past.  Stabilized.

## 2015-01-27 NOTE — Assessment & Plan Note (Signed)
03/18/14 Dr: Carlean Purl Colonoscopy: severe diverticulosis was noted in the sigmoid colon, mild diverticulosis was noted in the right colon, the colon mucosa was otherwise normal.   If current regimen is not adequately treating constipation then she needs to return to see me for more thorough rectal evaluation(?defecation dysfunction).   Taking Linzess 245mcg daily, MOM nightly, and prn Dulcolax PR/PO  Seeing GI regularly.

## 2015-01-27 NOTE — Assessment & Plan Note (Signed)
Not apparent. Takes Furosemide 20mg ,

## 2015-01-27 NOTE — Progress Notes (Signed)
Patient ID: Jean [name], female   DOB: 09-10-1941, 74 y.o.   MRN: [id number]   Code Status: DNR  Allergies  Allergen Reactions  . Azithromycin   . Biaxin [Clarithromycin]   . Clarithromycin   . Doxycycline   . Fosamax [Alendronate Sodium]   . Geodon [Ziprasidone Hydrochloride]   . Lipitor [Atorvastatin Calcium]   . Penicillins   . Pravachol [Pravastatin Sodium]   . Sulfonamide Derivatives   . Zocor [Simvastatin]     Chief Complaint  Patient presents with  . Medical Management of Chronic Issues  . Acute Visit    UTI, aches allover, left lower leg pain.     HPI: Patient is a 74 y.o. female seen in the [facility] at [facility] today for evaluation of left lateral lower leg pain since fall 01/13/15, aching all over, UTI,  and other chronic medical conditions.  Problem List Items Addressed This Visit    Anxiety state    With psychotic features, f/u psychiatry regularly, takes Lorazepam 05mg  bid,  Amitriptyline 50mg  hs, and Chlordiazepoxide 25mg  hs and daily prn, and Fluphenazine 1mg  daily.  Psych hospitalization x2 in the past.  Stabilized.          PERIPHERAL EDEMA    Not apparent. Takes Furosemide 20mg ,      Essential hypertension    Controlled, takes Furosemide 20mg  daily.        GERD (gastroesophageal reflux disease)    Stable. Continue Omeprazole 20mg  and prn Mintox 80ml q4h prn.         Other constipation    03/18/14 Dr: Carlean Purl Colonoscopy: severe diverticulosis was noted in the sigmoid colon, mild diverticulosis was noted in the right colon, the colon mucosa was otherwise normal.   If current regimen is not adequately treating constipation then she needs to return to see me for more thorough rectal evaluation(?defecation dysfunction).   Taking Linzess 216mcg daily, MOM nightly, and prn Dulcolax PR/PO  Seeing GI regularly.         Left leg pain - Primary    01/20/15 X-ray pelvis, left hip, femur, and knee: no acute fx 01/27/15 c/o lateral left  lower leg pain with ambulation. X-ray Left knee, tibia, fibula, and ankle-adding Flexeril 2.5mg  daily in addition to Tylenol for pain.        UTI (urinary tract infection)    01/25/15 Urine culture enterococcus 7 day course of Nitrofurantoin 100mg  bid started           Review of Systems:  Review of Systems  Constitutional: Negative for fever, chills and diaphoresis.  HENT: Positive for hearing loss and tinnitus. Negative for congestion, ear discharge, ear pain, nosebleeds and sore throat.   Eyes: Negative for photophobia, pain, discharge and redness.       Legally blind.   Respiratory: Positive for cough. Negative for shortness of breath, wheezing and stridor.   Cardiovascular: Negative for chest pain, palpitations and leg swelling.  Gastrointestinal: Positive for constipation. Negative for nausea, vomiting, abdominal pain, diarrhea and blood in stool.       C/o abd pain on and off. Not upon today's visit.   Endocrine: Negative for polydipsia.  Genitourinary: Positive for frequency (adult depends). Negative for dysuria, urgency and flank pain.       Pressure in her bladder area.   Musculoskeletal: Positive for gait problem. Negative for myalgias, back pain and neck pain.       C/o left hip, thigh, and knee pain with walking since her fall  01/13/15 New c/o of the lateral left lower leg pain and aches allover  Skin: Negative for rash.  Allergic/Immunologic: Negative for environmental allergies.  Neurological: Positive for tremors (diffused rigidity. ). Negative for dizziness, seizures, weakness (generalized) and headaches.       Lips smacking  Hematological: Does not bruise/bleed easily.  Psychiatric/Behavioral: Negative for suicidal ideas and hallucinations. The patient is nervous/anxious.     Past Medical History  Diagnosis Date  . Allergy   . Asthma   . Esophageal reflux   . Dysfunction of eustachian tube   . Irritable bowel syndrome with constipation   . Diverticulosis   .  Peptic ulcer disease   . Osteoporosis   . Peripheral neuropathy   . Trigeminal neuralgia     prior injury  . 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 Vitamin D deficiency   . HLD (hyperlipidemia)   . Obesity   . Anxiety   . Depression   . Optic neuritis   . Hemorrhoids   . History of rectal polyps   . HTN (hypertension)   . CAD (coronary artery disease)   . Elevated LFTs   . Hypertonicity of bladder   . Anemia   . Esophageal stricture 2002   Past Surgical History  Procedure Laterality Date  . Appendectomy    . Abdominal hysterectomy    . Tonsillectomy    . Carotid endarterectomy    . Colonoscopy  2008, 2015  . Upper gastrointestinal endoscopy  2002  . Anal rectal manometry N/A 07/05/2014    Procedure: ANO RECTAL MANOMETRY;  Surgeon: Leighton Ruff, MD;  Location: [facility];  Service: Endoscopy;  Laterality: N/A;   Social History:   reports that she has never smoked. She has never used smokeless tobacco. She reports that she does not drink alcohol or use illicit drugs.  Family History  Problem Relation Age of Onset  . Alzheimer's disease Mother   . Coronary artery disease Father   . Heart attack Father   . Heart disease Father   . Ovarian cancer Sister   . Lung cancer Brother   . Heart disease Brother   . Diabetes Neg Hx   . Colon cancer Neg Hx   . Anuerysm Father     AAA  . Anuerysm Brother     AAA    Medications: Patient's Medications  New Prescriptions   No medications on file  Previous Medications   ACETAMINOPHEN (TYLENOL) 325 MG TABLET    Take 650 mg by mouth at bedtime. And q6hr prn   ALUM & MAG HYDROXIDE-SIMETH (MYLANTA) 409-811-91 MG/5ML SUSPENSION    Take by mouth. Take 76ml every 4 hours as needed for indigestion   AMITRIPTYLINE (ELAVIL) 75 MG TABLET    Take 50 mg by mouth at bedtime.    ASPIRIN 81 MG EC TABLET    Take 81 mg by mouth daily.     BISACODYL (BISACODYL) 5 MG EC TABLET     Take 5 mg by mouth. Take one tablet by mouth every day as needed for constipation   BISACODYL (DULCOLAX) 10 MG SUPPOSITORY    Place 10 mg rectally. Insert 1 supp rectally every day as needed for constipation   CALCIUM CARBONATE-VITAMIN D (CALTRATE 600+D) 600-400 MG-UNIT PER TABLET    Take 1 tablet by mouth 2 (two) times daily. 250/125   CHLORDIAZEPOXIDE (LIBRIUM) 25 MG CAPSULE    Take one capsule by  mouth at bedtime for anxiety   FLUPHENAZINE (PROLIXIN) 1 MG TABLET    Take 1 mg by mouth at bedtime.   FLUTICASONE (FLONASE) 50 MCG/ACT NASAL SPRAY    Place 1 spray into both nostrils 2 (two) times daily.   FUROSEMIDE (LASIX) 20 MG TABLET    Take 20 mg by mouth every morning. 1 by mouth every am diuretic for peripheral edema    LORAZEPAM (ATIVAN) 0.5 MG TABLET    Take 0.5 mg by mouth 2 (two) times daily.     MAGNESIUM HYDROXIDE (MILK OF MAGNESIA) 400 MG/5ML SUSPENSION    Take by mouth. Take 30 ml by mouth every day as needed for constipation   MULTIPLE VITAMINS-MINERALS (CENTRUM SILVER PO)    Take 1 tablet by mouth.     OMEPRAZOLE (PRILOSEC) 20 MG CAPSULE    Take 20 mg by mouth daily.   SENNA (SENOKOT) 8.6 MG TABS TABLET    Take 2 tablets (17.2 mg total) by mouth at bedtime.   SODIUM PHOSPHATE (FLEET) 7-19 GM/118ML ENEM    Place 1 enema rectally. One rectally daily as needed  Modified Medications   No medications on file  Discontinued Medications   No medications on file     Physical Exam: Physical Exam  Constitutional: She is oriented to person, place, and time. She appears well-developed and well-nourished. No distress.  HENT:  Head: Normocephalic and atraumatic.  Right Ear: External ear normal. No lacerations. No drainage, swelling or tenderness. No foreign bodies. No mastoid tenderness. Tympanic membrane is not injected, not scarred, not perforated, not erythematous, not retracted and not bulging. No middle ear effusion. No hemotympanum. Decreased hearing is noted.  Left Ear: External ear  normal. No lacerations. No drainage, swelling or tenderness. No foreign bodies. No mastoid tenderness. Tympanic membrane is not injected, not scarred, not perforated, not erythematous, not retracted and not bulging.  No middle ear effusion. No hemotympanum. Decreased hearing is noted.  Eyes: Conjunctivae and EOM are normal. Pupils are equal, round, and reactive to light.  Neck: Neck supple.  Cardiovascular: Normal rate, regular rhythm and normal heart sounds.   Pulmonary/Chest: Effort normal and breath sounds normal. No respiratory distress. She has no rales. She exhibits no tenderness.  Abdominal: Soft. Bowel sounds are normal. She exhibits no distension. There is no tenderness. There is no rebound and no guarding.  External hemorrhoidsx2-not inflamed.   Genitourinary: Guaiac negative stool.  No hard stools in her rectal vault.   Musculoskeletal: She exhibits no edema (trace) or tenderness.       Left shoulder: She exhibits decreased range of motion, bony tenderness, swelling and pain. She exhibits no effusion, no crepitus, no deformity, no laceration, no spasm, normal pulse and normal strength.  Left proximal humerus fx-healed C/o lateral left lower leg pain with walking.   Neurological: She is alert and oriented to person, place, and time. She has normal reflexes. No cranial nerve deficit.  involuntary lip movements.   Skin: Skin is warm and dry.  Psychiatric: Her mood appears not anxious. Her affect is not angry, not blunt, not labile and not inappropriate. Her speech is not rapid and/or pressured, not delayed, not tangential and not slurred. She is slowed. She is not agitated, not hyperactive, not withdrawn, not actively hallucinating and not combative. Thought content is not paranoid and not delusional. Cognition and memory are impaired. She does not express impulsivity or inappropriate judgment. She exhibits a depressed mood. She expresses no homicidal and no suicidal ideation. She  expresses  no suicidal plans and no homicidal plans. She is communicative. She exhibits abnormal recent memory.  A little anxious/flat affect/slow in responding, no frank hallucinosis.  She is attentive.    Filed Vitals:   01/27/15 1131  BP: 140/70  Pulse: 66  Temp: 96.8 F (36 C)  TempSrc: Tympanic  Resp: 20      Labs reviewed: Basic Metabolic Panel:  Recent Labs  03/02/14 06/15/14 01/22/15  NA 139 143 139  K 4.3 4.1 4.2  BUN 17 18 15   CREATININE 0.8 0.9 0.9  TSH 2.48  --  1.99   Liver Function Tests:  Recent Labs  03/02/14 01/22/15  AST 19 22  ALT 17 16  ALKPHOS 115 141*   No results for input(s): LIPASE, AMYLASE in the last 8760 hours. No results for input(s): AMMONIA in the last 8760 hours. CBC:  Recent Labs  03/02/14 06/15/14 01/22/15  WBC 8.3 5.5 7.1  HGB 11.5* 11.5* 12.5  HCT 35* 37 38  PLT 451* 295 351   Lipid Panel: No results for input(s): CHOL, HDL, LDLCALC, TRIG, CHOLHDL, LDLDIRECT in the last 8760 hours.   Past Procedures:  09/22/13 CXR no cardiomeagly, pulmonary vascular congestion, or pleural effusion, no inflammatory consolidate or suspicious nodule, minimal atelectasis at eh mid and lower lungs bilaterally.   01/20/15 X-ray L femur, hip, knee, pelvis: no acute fracture.   Assessment/Plan Left leg pain 01/20/15 X-ray pelvis, left hip, femur, and knee: no acute fx 01/27/15 c/o lateral left lower leg pain with ambulation. X-ray Left knee, tibia, fibula, and ankle-adding Flexeril 2.5mg  daily in addition to Tylenol for pain.     UTI (urinary tract infection) 01/25/15 Urine culture enterococcus 7 day course of Nitrofurantoin 100mg  bid started     Other constipation 03/18/14 Dr: Carlean Purl Colonoscopy: severe diverticulosis was noted in the sigmoid colon, mild diverticulosis was noted in the right colon, the colon mucosa was otherwise normal.   If current regimen is not adequately treating constipation then she needs to return to see me for more thorough  rectal evaluation(?defecation dysfunction).   Taking Linzess 213mcg daily, MOM nightly, and prn Dulcolax PR/PO  Seeing GI regularly.      Essential hypertension Controlled, takes Furosemide 20mg  daily.     GERD (gastroesophageal reflux disease) Stable. Continue Omeprazole 20mg  and prn Mintox 64ml q4h prn.      Anxiety state With psychotic features, f/u psychiatry regularly, takes Lorazepam 05mg  bid,  Amitriptyline 50mg  hs, and Chlordiazepoxide 25mg  hs and daily prn, and Fluphenazine 1mg  daily.  Psych hospitalization x2 in the past.  Stabilized.       PERIPHERAL EDEMA Not apparent. Takes Furosemide 20mg ,     Family/ Staff Communication: observe the patient.   Goals of Care: AL  Labs/tests ordered: X-ray left knee, tibia, fibula, and ankle.

## 2015-01-27 NOTE — Assessment & Plan Note (Signed)
01/25/15 Urine culture enterococcus 7 day course of Nitrofurantoin 100mg  bid started

## 2015-01-27 NOTE — Assessment & Plan Note (Signed)
Controlled, takes Furosemide 20mg  daily.

## 2015-01-27 NOTE — Assessment & Plan Note (Signed)
01/20/15 X-ray pelvis, left hip, femur, and knee: no acute fx 01/27/15 c/o lateral left lower leg pain with ambulation. X-ray Left knee, tibia, fibula, and ankle-adding Flexeril 2.5mg  daily in addition to Tylenol for pain.

## 2015-02-17 ENCOUNTER — Encounter: Payer: Self-pay | Admitting: Nurse Practitioner

## 2015-02-17 DIAGNOSIS — R0781 Pleurodynia: Secondary | ICD-10-CM | POA: Insufficient documentation

## 2015-03-03 ENCOUNTER — Encounter: Payer: Self-pay | Admitting: Nurse Practitioner

## 2015-03-14 ENCOUNTER — Other Ambulatory Visit: Payer: Self-pay | Admitting: *Deleted

## 2015-03-24 ENCOUNTER — Ambulatory Visit (INDEPENDENT_AMBULATORY_CARE_PROVIDER_SITE_OTHER): Payer: Medicare Other | Admitting: Internal Medicine

## 2015-03-24 ENCOUNTER — Encounter: Payer: Self-pay | Admitting: Internal Medicine

## 2015-03-24 VITALS — BP 124/74 | HR 66 | Ht 61.0 in | Wt 146.0 lb

## 2015-03-24 DIAGNOSIS — N8184 Pelvic muscle wasting: Secondary | ICD-10-CM | POA: Diagnosis not present

## 2015-03-24 DIAGNOSIS — K5909 Other constipation: Secondary | ICD-10-CM

## 2015-03-24 DIAGNOSIS — K5900 Constipation, unspecified: Secondary | ICD-10-CM | POA: Diagnosis not present

## 2015-03-24 DIAGNOSIS — K589 Irritable bowel syndrome without diarrhea: Secondary | ICD-10-CM | POA: Diagnosis not present

## 2015-03-24 DIAGNOSIS — M6289 Other specified disorders of muscle: Secondary | ICD-10-CM

## 2015-03-24 DIAGNOSIS — R140 Abdominal distension (gaseous): Secondary | ICD-10-CM | POA: Diagnosis not present

## 2015-03-24 MED ORDER — VSL#3 PO CAPS: 2.0000 | ORAL_CAPSULE | Freq: Every day | ORAL | Status: DC

## 2015-03-24 NOTE — Patient Instructions (Signed)
   Please start taking VSL # 3 2 capsules daily to help bloating. See if you can wean down on the Norco  I appreciate the opportunity to care for you. Gatha Mayer, MD, [facility]

## 2015-03-24 NOTE — Progress Notes (Signed)
   Subjective:    Patient ID: Jean [name], female    DOB: 11-Nov-1940, 74 y.o.   MRN: [id number] Cc bloating HPI Returns in f/u w/ known IBS, chronic constipation and pelvic floor dysfx. Daily defecation w/ Linzess 290 ug qd, prunes, senokot . Also some MOM. Having bloating "from here (pelvis) to my boobs". Feels better after defecation. Fell 2 months ago and hairline tibia Fx left - on Norco bid since.  Medications, allergies, past medical history, past surgical history, family history and social history are reviewed and updated in the EMR.    Review of Systems As above    Objective:   Physical Exam @BP  146/84 mmHg  Pulse 66  Ht 5\' 1"  (1.549 m)  Wt 146 lb (66.225 kg)  BMI 27.60 kg/m2@  General:  NAD Eyes:   Anicteric, legally blind Mouth: Tardive dyskinesia Abdomen:  Protuberant soft and nontender, BS+     Data Reviewed:  Wt Readings from Last 3 Encounters:  03/24/15 146 lb (66.225 kg)  08/06/14 142 lb (64.411 kg)  07/29/14 144 lb (65.318 kg)   [facility] notes, med lists      Assessment & Plan:  IBS (irritable bowel syndrome)  Chronic constipation  Pelvic floor dysfunction  Bloating  Overall is improved w/ Linzess vs. Before Will add VSL # 3 2 caps daily (probiotic) Reassured Try to wean NOrco See me prn  Cc: [facility]

## 2015-04-01 ENCOUNTER — Non-Acute Institutional Stay: Payer: Medicare Other | Admitting: Nurse Practitioner

## 2015-04-01 ENCOUNTER — Encounter: Payer: Self-pay | Admitting: Nurse Practitioner

## 2015-04-01 DIAGNOSIS — K5900 Constipation, unspecified: Secondary | ICD-10-CM

## 2015-04-01 DIAGNOSIS — N390 Urinary tract infection, site not specified: Secondary | ICD-10-CM

## 2015-04-01 DIAGNOSIS — R609 Edema, unspecified: Secondary | ICD-10-CM | POA: Diagnosis not present

## 2015-04-01 DIAGNOSIS — F418 Other specified anxiety disorders: Secondary | ICD-10-CM | POA: Diagnosis not present

## 2015-04-01 DIAGNOSIS — K219 Gastro-esophageal reflux disease without esophagitis: Secondary | ICD-10-CM

## 2015-04-01 DIAGNOSIS — K5909 Other constipation: Secondary | ICD-10-CM

## 2015-04-01 DIAGNOSIS — R0781 Pleurodynia: Secondary | ICD-10-CM | POA: Diagnosis not present

## 2015-04-01 DIAGNOSIS — I10 Essential (primary) hypertension: Secondary | ICD-10-CM | POA: Diagnosis not present

## 2015-04-01 DIAGNOSIS — K589 Irritable bowel syndrome without diarrhea: Secondary | ICD-10-CM | POA: Diagnosis not present

## 2015-04-01 NOTE — Assessment & Plan Note (Signed)
Controlled, takes Furosemide 20mg  daily.

## 2015-04-01 NOTE — Assessment & Plan Note (Signed)
With psychotic features, f/u psychiatry regularly, takes Lorazepam 05mg  bid,  Amitriptyline 50mg  hs, and Chlordiazepoxide 25mg  hs, and Fluphenazine 1mg  daily.  Psych hospitalization x2 in the past.  Stabilized.

## 2015-04-01 NOTE — Assessment & Plan Note (Signed)
03/18/14 Dr: Carlean Purl Colonoscopy: severe diverticulosis was noted in the sigmoid colon, mild diverticulosis was noted in the right colon, the colon mucosa was otherwise normal.   If current regimen is not adequately treating constipation then she needs to return to see me for more thorough rectal evaluation(?defecation dysfunction).   Taking Linzess 269mcg daily, MOM nightly, and prn Dulcolax PR/PO  Seeing GI regularly.   03/24/15 GI recommended to reduce Norco

## 2015-04-01 NOTE — Assessment & Plan Note (Signed)
03/24/15 GI consult: weaning Norco-reduce Norco to daily from Bid.

## 2015-04-01 NOTE — Assessment & Plan Note (Signed)
Not apparent. Takes Furosemide 20mg ,

## 2015-04-01 NOTE — Progress Notes (Signed)
Patient ID: Jean [name], female   DOB: 23-Nov-1940, 74 y.o.   MRN: [id number]   Code Status: DNR  Allergies  Allergen Reactions  . Azithromycin   . Biaxin [Clarithromycin]   . Clarithromycin   . Doxycycline   . Fosamax [Alendronate Sodium]   . Geodon [Ziprasidone Hydrochloride]   . Lipitor [Atorvastatin Calcium]   . Penicillins   . Pravachol [Pravastatin Sodium]   . Sulfonamide Derivatives   . Zocor [Simvastatin]     Chief Complaint  Patient presents with  . Medical Management of Chronic Issues    HPI: Patient is a 74 y.o. female seen in the [facility] at [facility] today for evaluate of chronic medical conditions.  Problem List Items Addressed This Visit    Depression with anxiety    With psychotic features, f/u psychiatry regularly, takes Lorazepam 05mg  bid,  Amitriptyline 50mg  hs, and Chlordiazepoxide 25mg  hs, and Fluphenazine 1mg  daily.  Psych hospitalization x2 in the past.  Stabilized.       Irritable bowel syndrome - Primary    03/24/15 GI consult: weaning Norco-reduce Norco to daily from Bid.      PERIPHERAL EDEMA    Not apparent. Takes Furosemide 20mg ,      Essential hypertension    Controlled, takes Furosemide 20mg  daily.        Constipation    03/18/14 Dr: Carlean Purl Colonoscopy: severe diverticulosis was noted in the sigmoid colon, mild diverticulosis was noted in the right colon, the colon mucosa was otherwise normal.   If current regimen is not adequately treating constipation then she needs to return to see me for more thorough rectal evaluation(?defecation dysfunction).   Taking Linzess 257mcg daily, MOM nightly, and prn Dulcolax PR/PO  Seeing GI regularly.   03/24/15 GI recommended to reduce Norco        GERD (gastroesophageal reflux disease)    Stable. Continue Omeprazole 20mg  and prn Mintox 73ml q4h prn.       UTI (urinary tract infection)    01/25/15 Urine culture enterococcus 7 day course of Nitrofurantoin 100mg  bid started  03/26/15  urine culture 55,000c/ml K. Pneumoniae and enterococcus-will observe the patient.       Rib pain on right side    02/17/15 X-ray R rib and thoracic spine: no definite radiographic evidence of acute fracture or dislocation of ribs in these projections. No pneumothorax, mild cardiomegaly, mild osteoporosis demonstrated, mild spondylosis. L1(20% loss of height). Norco 5/325mg  bid.   04/01/15 reduce Norco 325mg  po daily.          Review of Systems:  Review of Systems  Constitutional: Negative for fever, chills and diaphoresis.  HENT: Positive for hearing loss and tinnitus. Negative for congestion, ear discharge, ear pain, nosebleeds and sore throat.   Eyes: Negative for photophobia, pain, discharge and redness.       Legally blind.   Respiratory: Positive for cough. Negative for shortness of breath, wheezing and stridor.   Cardiovascular: Negative for chest pain, palpitations and leg swelling.  Gastrointestinal: Positive for constipation. Negative for nausea, vomiting, abdominal pain, diarrhea and blood in stool.       C/o abd pain on and off. Not upon today's visit.   Endocrine: Negative for polydipsia.  Genitourinary: Positive for frequency (adult depends). Negative for dysuria, urgency and flank pain.       Pressure in her bladder area.   Musculoskeletal: Positive for gait problem. Negative for myalgias, back pain and neck pain.       C/o  left hip, thigh, and knee pain with walking since her fall 01/13/15 New c/o of the lateral left lower leg pain and aches allover  Skin: Negative for rash.  Allergic/Immunologic: Negative for environmental allergies.  Neurological: Positive for tremors (diffused rigidity. ). Negative for dizziness, seizures, weakness (generalized) and headaches.       Lips smacking  Hematological: Does not bruise/bleed easily.  Psychiatric/Behavioral: Negative for suicidal ideas and hallucinations. The patient is nervous/anxious.     Past Medical History  Diagnosis  Date  . Allergy   . Asthma   . Esophageal reflux   . Dysfunction of eustachian tube   . Irritable bowel syndrome with constipation   . Diverticulosis   . Peptic ulcer disease   . Osteoporosis   . Peripheral neuropathy   . Trigeminal neuralgia     prior injury  . 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 Vitamin D deficiency   . HLD (hyperlipidemia)   . Obesity   . Anxiety   . Depression   . Optic neuritis   . Hemorrhoids   . History of rectal polyps   . HTN (hypertension)   . CAD (coronary artery disease)   . Elevated LFTs   . Hypertonicity of bladder   . Anemia   . Esophageal stricture 2002   Past Surgical History  Procedure Laterality Date  . Appendectomy    . Abdominal hysterectomy    . Tonsillectomy    . Carotid endarterectomy    . Colonoscopy  2008, 2015  . Upper gastrointestinal endoscopy  2002  . Anal rectal manometry N/A 07/05/2014    Procedure: ANO RECTAL MANOMETRY;  Surgeon: Leighton Ruff, MD;  Location: [facility];  Service: Endoscopy;  Laterality: N/A;   Social History:   reports that she has never smoked. She has never used smokeless tobacco. She reports that she does not drink alcohol or use illicit drugs.  Family History  Problem Relation Age of Onset  . Alzheimer's disease Mother   . Coronary artery disease Father   . Heart attack Father   . Heart disease Father   . Ovarian cancer Sister   . Lung cancer Brother   . Heart disease Brother   . Diabetes Neg Hx   . Colon cancer Neg Hx   . Anuerysm Father     AAA  . Anuerysm Brother     AAA    Medications: Patient's Medications  New Prescriptions   No medications on file  Previous Medications   ACETAMINOPHEN (TYLENOL) 325 MG TABLET    Take 650 mg by mouth at bedtime. And q6hr prn   ALUM & MAG HYDROXIDE-SIMETH (MYLANTA) [id number] MG/5ML SUSPENSION    Take by mouth. Take 52ml every 4 hours as needed for indigestion   AMITRIPTYLINE  (ELAVIL) 75 MG TABLET    Take 50 mg by mouth at bedtime.    ASPIRIN 81 MG EC TABLET    Take 81 mg by mouth daily.     BISACODYL (BISACODYL) 5 MG EC TABLET    Take 5 mg by mouth. Take one tablet by mouth every day as needed for constipation   BISACODYL (DULCOLAX) 10 MG SUPPOSITORY    Place 10 mg rectally. Insert 1 supp rectally every day as needed for constipation   CALCIUM CARBONATE-VITAMIN D (CALTRATE 600+D) 600-400 MG-UNIT PER TABLET    Take 1 tablet by mouth 2 (two) times daily. 250/125   CHLORDIAZEPOXIDE (  LIBRIUM) 25 MG CAPSULE    Take one capsule by mouth at bedtime for anxiety   FLUPHENAZINE (PROLIXIN) 1 MG TABLET    Take 1 mg by mouth at bedtime.   FLUTICASONE (FLONASE) 50 MCG/ACT NASAL SPRAY    Place 1 spray into both nostrils 2 (two) times daily.   FUROSEMIDE (LASIX) 20 MG TABLET    Take 20 mg by mouth every morning. 1 by mouth every am diuretic for peripheral edema    LINACLOTIDE (LINZESS) 290 MCG CAPS CAPSULE    Take 290 mcg by mouth daily.   LORAZEPAM (ATIVAN) 0.5 MG TABLET    Take 0.5 mg by mouth 2 (two) times daily.     MAGNESIUM HYDROXIDE (MILK OF MAGNESIA) 400 MG/5ML SUSPENSION    Take by mouth. Take 30 ml by mouth every day as needed for constipation   MULTIPLE VITAMINS-MINERALS (CENTRUM SILVER PO)    Take 1 tablet by mouth.     OMEPRAZOLE (PRILOSEC) 20 MG CAPSULE    Take 20 mg by mouth daily.   PROBIOTIC PRODUCT (VSL#3) CAPS    Take 2 capsules by mouth daily.   SENNA (SENOKOT) 8.6 MG TABS TABLET    Take 2 tablets (17.2 mg total) by mouth at bedtime.   SODIUM PHOSPHATE (FLEET) 7-19 GM/118ML ENEM    Place 1 enema rectally. One rectally daily as needed  Modified Medications   No medications on file  Discontinued Medications   No medications on file     Physical Exam: Physical Exam  Constitutional: She is oriented to person, place, and time. She appears well-developed and well-nourished. No distress.  HENT:  Head: Normocephalic and atraumatic.  Right Ear: External ear  normal. No lacerations. No drainage, swelling or tenderness. No foreign bodies. No mastoid tenderness. Tympanic membrane is not injected, not scarred, not perforated, not erythematous, not retracted and not bulging. No middle ear effusion. No hemotympanum. Decreased hearing is noted.  Left Ear: External ear normal. No lacerations. No drainage, swelling or tenderness. No foreign bodies. No mastoid tenderness. Tympanic membrane is not injected, not scarred, not perforated, not erythematous, not retracted and not bulging.  No middle ear effusion. No hemotympanum. Decreased hearing is noted.  Eyes: Conjunctivae and EOM are normal. Pupils are equal, round, and reactive to light.  Neck: Neck supple.  Cardiovascular: Normal rate, regular rhythm and normal heart sounds.   Pulmonary/Chest: Effort normal and breath sounds normal. No respiratory distress. She has no rales. She exhibits no tenderness.  Abdominal: Soft. Bowel sounds are normal. She exhibits no distension. There is no tenderness. There is no rebound and no guarding.  External hemorrhoidsx2-not inflamed.   Genitourinary: Guaiac negative stool.  No hard stools in her rectal vault.   Musculoskeletal: She exhibits no edema (trace) or tenderness.       Left shoulder: She exhibits decreased range of motion, bony tenderness, swelling and pain. She exhibits no effusion, no crepitus, no deformity, no laceration, no spasm, normal pulse and normal strength.  Left proximal humerus fx-healed C/o lateral left lower leg pain with walking.   Neurological: She is alert and oriented to person, place, and time. She has normal reflexes. No cranial nerve deficit.  involuntary lip movements.   Skin: Skin is warm and dry.  Psychiatric: Her mood appears not anxious. Her affect is not angry, not blunt, not labile and not inappropriate. Her speech is not rapid and/or pressured, not delayed, not tangential and not slurred. She is slowed. She is not agitated, not  hyperactive, not withdrawn, not actively hallucinating and not combative. Thought content is not paranoid and not delusional. Cognition and memory are impaired. She does not express impulsivity or inappropriate judgment. She exhibits a depressed mood. She expresses no homicidal and no suicidal ideation. She expresses no suicidal plans and no homicidal plans. She is communicative. She exhibits abnormal recent memory.  A little anxious/flat affect/slow in responding, no frank hallucinosis.  She is attentive.    Filed Vitals:   04/01/15 1503  BP: 140/82  Pulse: 62  Temp: 97.8 F (36.6 C)  TempSrc: Tympanic  Resp: 18      Labs reviewed: Basic Metabolic Panel:  Recent Labs  06/15/14 01/22/15  NA 143 139  K 4.1 4.2  BUN 18 15  CREATININE 0.9 0.9  TSH  --  1.99   Liver Function Tests:  Recent Labs  01/22/15  AST 22  ALT 16  ALKPHOS 141*   No results for input(s): LIPASE, AMYLASE in the last 8760 hours. No results for input(s): AMMONIA in the last 8760 hours. CBC:  Recent Labs  06/15/14 01/22/15  WBC 5.5 7.1  HGB 11.5* 12.5  HCT 37 38  PLT 295 351   Lipid Panel: No results for input(s): CHOL, HDL, LDLCALC, TRIG, CHOLHDL, LDLDIRECT in the last 8760 hours.  Past Procedures:  09/22/13 CXR no cardiomeagly, pulmonary vascular congestion, or pleural effusion, no inflammatory consolidate or suspicious nodule, minimal atelectasis at eh mid and lower lungs bilaterally.   01/20/15 X-ray L femur, hip, knee, pelvis: no acute fracture.   Assessment/Plan Irritable bowel syndrome 03/24/15 GI consult: weaning Norco-reduce Norco to daily from Bid.  Constipation 03/18/14 Dr: Carlean Purl Colonoscopy: severe diverticulosis was noted in the sigmoid colon, mild diverticulosis was noted in the right colon, the colon mucosa was otherwise normal.   If current regimen is not adequately treating constipation then she needs to return to see me for more thorough rectal evaluation(?defecation  dysfunction).   Taking Linzess 217mcg daily, MOM nightly, and prn Dulcolax PR/PO  Seeing GI regularly.   03/24/15 GI recommended to reduce Norco    PERIPHERAL EDEMA Not apparent. Takes Furosemide 20mg ,  GERD (gastroesophageal reflux disease) Stable. Continue Omeprazole 20mg  and prn Mintox 12ml q4h prn.   Depression with anxiety With psychotic features, f/u psychiatry regularly, takes Lorazepam 05mg  bid,  Amitriptyline 50mg  hs, and Chlordiazepoxide 25mg  hs, and Fluphenazine 1mg  daily.  Psych hospitalization x2 in the past.  Stabilized.   Essential hypertension Controlled, takes Furosemide 20mg  daily.    Rib pain on right side 02/17/15 X-ray R rib and thoracic spine: no definite radiographic evidence of acute fracture or dislocation of ribs in these projections. No pneumothorax, mild cardiomegaly, mild osteoporosis demonstrated, mild spondylosis. L1(20% loss of height). Norco 5/325mg  bid.   04/01/15 reduce Norco 325mg  po daily.   UTI (urinary tract infection) 01/25/15 Urine culture enterococcus 7 day course of Nitrofurantoin 100mg  bid started  03/26/15 urine culture 55,000c/ml K. Pneumoniae and enterococcus-will observe the patient.     Family/ Staff Communication: observe the patient.   Goals of Care: AL  Labs/tests ordered: none

## 2015-04-01 NOTE — Assessment & Plan Note (Signed)
02/17/15 X-ray R rib and thoracic spine: no definite radiographic evidence of acute fracture or dislocation of ribs in these projections. No pneumothorax, mild cardiomegaly, mild osteoporosis demonstrated, mild spondylosis. L1(20% loss of height). Norco 5/325mg  bid.   04/01/15 reduce Norco 325mg  po daily.

## 2015-04-01 NOTE — Assessment & Plan Note (Signed)
01/25/15 Urine culture enterococcus 7 day course of Nitrofurantoin 100mg  bid started  03/26/15 urine culture 55,000c/ml K. Pneumoniae and enterococcus-will observe the patient.

## 2015-04-01 NOTE — Assessment & Plan Note (Signed)
Stable. Continue Omeprazole 20mg  and prn Mintox 9ml q4h prn.

## 2015-04-25 ENCOUNTER — Encounter: Payer: Self-pay | Admitting: Internal Medicine

## 2015-05-19 ENCOUNTER — Other Ambulatory Visit: Payer: Self-pay

## 2015-05-19 MED ORDER — HYDROCODONE-ACETAMINOPHEN 5-325 MG PO TABS: 1.0000 | ORAL_TABLET | Freq: Two times a day (BID) | ORAL | Status: DC

## 2015-05-19 NOTE — Telephone Encounter (Signed)
RX sent to [facility] @ [phone], phone number [phone]. This is a nursing home refill request.   

## 2015-06-30 ENCOUNTER — Non-Acute Institutional Stay: Payer: Medicare Other | Admitting: Nurse Practitioner

## 2015-06-30 DIAGNOSIS — Z8739 Personal history of other diseases of the musculoskeletal system and connective tissue: Secondary | ICD-10-CM | POA: Diagnosis not present

## 2015-06-30 DIAGNOSIS — I10 Essential (primary) hypertension: Secondary | ICD-10-CM

## 2015-06-30 DIAGNOSIS — D638 Anemia in other chronic diseases classified elsewhere: Secondary | ICD-10-CM | POA: Diagnosis not present

## 2015-06-30 DIAGNOSIS — K219 Gastro-esophageal reflux disease without esophagitis: Secondary | ICD-10-CM | POA: Diagnosis not present

## 2015-06-30 DIAGNOSIS — K5900 Constipation, unspecified: Secondary | ICD-10-CM

## 2015-06-30 DIAGNOSIS — F418 Other specified anxiety disorders: Secondary | ICD-10-CM | POA: Diagnosis not present

## 2015-06-30 DIAGNOSIS — K5909 Other constipation: Secondary | ICD-10-CM

## 2015-06-30 NOTE — Progress Notes (Signed)
Patient ID: Jean [name], female   DOB: [birth year], 74 y.o.   MRN: [id number]  Location:  [facility] Provider:  Marlana Latus NP  Code Status:  DNR Goals of care: Advanced Directive information    Chief Complaint  Patient presents with  . Medical Management of Chronic Issues  . Acute Visit    pain and aches allover     HPI: Patient is a 74 y.o. female seen in the [facility] at [facility] today for evaluation of  C/o pain allover, but pointed to her stomach upon my visit, denied nausea or vomiting, has BM yesterday, afebrile, still walking independent.   Review of Systems:  Review of Systems  Constitutional: Negative for fever, chills and diaphoresis.  HENT: Positive for hearing loss and tinnitus. Negative for congestion, ear discharge, ear pain and nosebleeds.   Eyes: Negative for photophobia, pain and discharge.       Legally blind.   Respiratory: Positive for cough. Negative for shortness of breath and wheezing.   Cardiovascular: Negative for chest pain, palpitations and leg swelling.  Gastrointestinal: Positive for abdominal pain and constipation. Negative for nausea, vomiting and diarrhea.       C/o abd pain on and off. Not upon today's visit.   Genitourinary: Positive for frequency (adult depends). Negative for dysuria, urgency and flank pain.       Pressure in her bladder area.   Musculoskeletal: Negative for myalgias, back pain and neck pain.       C/o left hip, thigh, and knee pain with walking since her fall 01/13/15 New c/o of the lateral left lower leg pain and aches allover  Skin: Negative for rash.  Neurological: Positive for tremors (diffused rigidity. ). Negative for dizziness, seizures, weakness (generalized) and headaches.       Lips smacking  Psychiatric/Behavioral: Negative for suicidal ideas. The patient is nervous/anxious.        Delusion    Past Medical History  Diagnosis Date  . Allergy   . Asthma   . Esophageal reflux   . Dysfunction of eustachian  tube   . Irritable bowel syndrome with constipation   . Diverticulosis   . Peptic ulcer disease   . Osteoporosis   . Peripheral neuropathy ([place])   . Trigeminal neuralgia     prior injury  . 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 Vitamin D deficiency   . HLD (hyperlipidemia)   . Obesity   . Anxiety   . Depression   . Optic neuritis   . Hemorrhoids   . History of rectal polyps   . HTN (hypertension)   . CAD (coronary artery disease)   . Elevated LFTs   . Hypertonicity of bladder   . Anemia   . Esophageal stricture 2002    Patient Active Problem List   Diagnosis Date Noted  . Rib pain on right side 02/17/2015  . UTI (urinary tract infection) 01/27/2015  . Tinnitus of both ears 01/21/2015  . Meniscus tear 01/21/2015  . Other constipation 08/06/2014  . Rectocele 06/28/2014  . Pelvic floor dysfunction 06/28/2014  . Diverticulitis of colon (without mention of hemorrhage) 06/10/2014  . Change in bowel habits 03/02/2014  . Unspecified constipation 03/02/2014  . Hemorrhoids 02/11/2014  . Cough 10/01/2013  . Anemia of chronic disease 10/01/2013  . Liver enzyme elevation 04/13/2013  . Hypertonicity of bladder 04/13/2013  . GERD (gastroesophageal reflux disease) 04/13/2013  .  Closed fracture of unspecified part of upper end of humerus 04/02/2013  . Carotid artery disease ([place]) 12/31/2011  . Constipation 02/23/2011  . OPTIC NEURITIS 05/11/2010  . OVERWEIGHT 05/05/2009  . LATERAL EPICONDYLITIS OF ELBOW 05/05/2009  . OSTEOPOROSIS 05/05/2009  . DIVERTICULOSIS-COLON 12/02/2008  . Irritable bowel syndrome 12/02/2008  . RECTAL POLYPS 12/02/2008  . Abdominal pain, generalized 12/02/2008  . History of muscle pain 08/17/2008  . PERIPHERAL EDEMA 08/17/2008  . VITAMIN D DEFICIENCY 05/19/2008  . Hyperlipidemia 05/19/2008  . SKIN RASH 05/14/2008  . Anxiety state 01/06/2008  . Depression with anxiety 01/06/2008  .  INTERNAL HEMORRHOIDS 01/06/2008  . EXTERNAL HEMORRHOIDS 01/06/2008  . Essential hypertension 11/20/2007  . EUSTACHIAN TUBE DYSFUNCTION 08/01/2007  . Seasonal and perennial allergic rhinitis 08/01/2007  . Asthma, mild intermittent 08/01/2007    Allergies  Allergen Reactions  . Azithromycin   . Biaxin [Clarithromycin]   . Clarithromycin   . Doxycycline   . Fosamax [Alendronate Sodium]   . Geodon [Ziprasidone Hydrochloride]   . Lipitor [Atorvastatin Calcium]   . Penicillins   . Pravachol [Pravastatin Sodium]   . Sulfonamide Derivatives   . Zocor [Simvastatin]     Medications: Patient's Medications  New Prescriptions   No medications on file  Previous Medications   ACETAMINOPHEN (TYLENOL) 325 MG TABLET    Take 650 mg by mouth at bedtime. And q6hr prn   ALUM & MAG HYDROXIDE-SIMETH (MYLANTA) 409-811-91 MG/5ML SUSPENSION    Take by mouth. Take 46ml every 4 hours as needed for indigestion   AMITRIPTYLINE (ELAVIL) 75 MG TABLET    Take 50 mg by mouth at bedtime.    ASPIRIN 81 MG EC TABLET    Take 81 mg by mouth daily.     BISACODYL (BISACODYL) 5 MG EC TABLET    Take 5 mg by mouth. Take one tablet by mouth every day as needed for constipation   BISACODYL (DULCOLAX) 10 MG SUPPOSITORY    Place 10 mg rectally. Insert 1 supp rectally every day as needed for constipation   CALCIUM CARBONATE-VITAMIN D (CALTRATE 600+D) 600-400 MG-UNIT PER TABLET    Take 1 tablet by mouth 2 (two) times daily. 250/125   CHLORDIAZEPOXIDE (LIBRIUM) 25 MG CAPSULE    Take one capsule by mouth at bedtime for anxiety   FLUPHENAZINE (PROLIXIN) 1 MG TABLET    Take 1 mg by mouth at bedtime.   FLUTICASONE (FLONASE) 50 MCG/ACT NASAL SPRAY    Place 1 spray into both nostrils 2 (two) times daily.   FUROSEMIDE (LASIX) 20 MG TABLET    Take 20 mg by mouth every morning. 1 by mouth every am diuretic for peripheral edema    HYDROCODONE-ACETAMINOPHEN (NORCO/VICODIN) 5-325 MG PER TABLET    Take 1 tablet by mouth 2 (two) times daily.     LINACLOTIDE (LINZESS) 290 MCG CAPS CAPSULE    Take 290 mcg by mouth daily.   LORAZEPAM (ATIVAN) 0.5 MG TABLET    Take 0.5 mg by mouth 2 (two) times daily.     MAGNESIUM HYDROXIDE (MILK OF MAGNESIA) 400 MG/5ML SUSPENSION    Take by mouth. Take 30 ml by mouth every day as needed for constipation   MULTIPLE VITAMINS-MINERALS (CENTRUM SILVER PO)    Take 1 tablet by mouth.     OMEPRAZOLE (PRILOSEC) 20 MG CAPSULE    Take 20 mg by mouth daily.   PROBIOTIC PRODUCT (VSL#3) CAPS    Take 2 capsules by mouth daily.   SENNA (SENOKOT) 8.6 MG TABS TABLET  Take 2 tablets (17.2 mg total) by mouth at bedtime.   SODIUM PHOSPHATE (FLEET) 7-19 GM/118ML ENEM    Place 1 enema rectally. One rectally daily as needed  Modified Medications   No medications on file  Discontinued Medications   No medications on file    Physical Exam: Filed Vitals:   06/30/15 0946  BP: 136/80  Pulse: 72  Temp: 97.2 F (36.2 C)  TempSrc: Tympanic  Resp: 16   There is no weight on file to calculate BMI.  Physical Exam  Constitutional: She is oriented to person, place, and time. She appears well-developed and well-nourished. No distress.  HENT:  Head: Normocephalic and atraumatic.  Right Ear: External ear normal. No lacerations. No drainage, swelling or tenderness. No foreign bodies. No mastoid tenderness. Tympanic membrane is not injected, not scarred, not perforated, not erythematous, not retracted and not bulging. No middle ear effusion. No hemotympanum. Decreased hearing is noted.  Left Ear: External ear normal. No lacerations. No drainage, swelling or tenderness. No foreign bodies. No mastoid tenderness. Tympanic membrane is not injected, not scarred, not perforated, not erythematous, not retracted and not bulging.  No middle ear effusion. No hemotympanum. Decreased hearing is noted.  Eyes: Conjunctivae and EOM are normal. Pupils are equal, round, and reactive to light.  Neck: Neck supple.  Cardiovascular: Normal rate,  regular rhythm and normal heart sounds.   Pulmonary/Chest: Effort normal and breath sounds normal. No respiratory distress. She has no rales. She exhibits no tenderness.  Abdominal: Soft. Bowel sounds are normal. She exhibits no distension. There is tenderness. There is no rebound and no guarding.  External hemorrhoidsx2-not inflamed from previous exam. Epigastric area tenderness when palpated.   Genitourinary: Guaiac negative stool.  No hard stools in her rectal vault.   Musculoskeletal: She exhibits no edema (trace) or tenderness.       Left shoulder: She exhibits decreased range of motion, bony tenderness, swelling and pain. She exhibits no effusion, no crepitus, no deformity, no laceration, no spasm, normal pulse and normal strength.  Left proximal humerus fx-healed C/o lateral left lower leg pain with walking.   Neurological: She is alert and oriented to person, place, and time. She has normal reflexes. No cranial nerve deficit.  involuntary lip movements.   Skin: Skin is warm and dry.  Psychiatric: Her mood appears not anxious. Her affect is not angry, not blunt, not labile and not inappropriate. Her speech is not rapid and/or pressured, not delayed, not tangential and not slurred. She is slowed. She is not agitated, not hyperactive, not withdrawn, not actively hallucinating and not combative. Thought content is not paranoid and not delusional. Cognition and memory are impaired. She does not express impulsivity or inappropriate judgment. She exhibits a depressed mood. She expresses no homicidal and no suicidal ideation. She expresses no suicidal plans and no homicidal plans. She is communicative. She exhibits abnormal recent memory.  A little anxious/flat affect/slow in responding, no frank hallucinosis.  She is attentive.    Labs reviewed: Basic Metabolic Panel:  Recent Labs  01/22/15 07/02/15  NA 139 138  K 4.2 4.4  BUN 15 10  CREATININE 0.9 0.8    Liver Function Tests:  Recent  Labs  01/22/15 07/02/15  AST 22 28  ALT 16 19  ALKPHOS 141* 123    CBC:  Recent Labs  01/22/15 07/02/15  WBC 7.1 6.8  HGB 12.5 12.3  HCT 38 38  PLT 351 369    Lab Results  Component Value Date  TSH 1.87 07/02/2015   Lab Results  Component Value Date   HGBA1C * 12/31/2010    6.0 (NOTE)                                                                       According to the ADA Clinical Practice Recommendations for 2011, when HbA1c is used as a screening test:   >=6.5%   Diagnostic of Diabetes Mellitus           (if abnormal result  is confirmed)  5.7-6.4%   Increased risk of developing Diabetes Mellitus  References:Diagnosis and Classification of Diabetes Mellitus,Diabetes GHWE,9937,16(RCVEL 1):S62-S69 and Standards of Medical Care in         Diabetes - 2011,Diabetes Care,2011,34  (Suppl 1):S11-S61.   Lab Results  Component Value Date   CHOL 155 04/04/2013   HDL 53 04/04/2013   LDLCALC 80 04/04/2013   LDLDIRECT 190.0 03/24/2012   TRIG 108 04/04/2013   CHOLHDL 4 03/24/2012    Significant Diagnostic Results since last visit: none  Patient Care Team: Estill Dooms, MD as PCP - General (Internal Medicine) [name], MD (Ophthalmology) [name], MD (Psychiatry) Braiden Rodman X, NP as Nurse Practitioner (Nurse Practitioner)  Assessment/Plan Problem List Items Addressed This Visit    History of muscle pain    Occasionally c/o pain allover, takes Tylenol 650mg  nightly, daily Flexeril 5mg  tid, Norco 325/5 mg bid.       GERD (gastroesophageal reflux disease) - Primary    Complains pain/aches allover, then pointed to her stomach, denied nausea, vomiting, or constipation. CBC, CMP, TSH, KUB unremarkable, continue Famotidine 20mg  and prn Mintox 66ml q4h prn. Adding Carafate 10mg  po ac and hs.       Essential hypertension    Blood pressure is controlled, continue Furosemide 20mg , last Bun 10, creat 0.82 07/02/15.       Depression with anxiety    With psychotic  features, stated there is a chip inserted into her body,  f/u psychiatry regularly, takes Lorazepam 05mg  bid,  Amitriptyline 50mg  hs, and Chlordiazepoxide 25mg  hs, and Fluphenazine 1mg  daily.  Psych hospitalization x2 in the past.        Constipation    03/18/14 Dr: Carlean Purl Colonoscopy: severe diverticulosis was noted in the sigmoid colon, mild diverticulosis was noted in the right colon, the colon mucosa was otherwise normal.   If current regimen is not adequately treating constipation then she needs to return to see me for more thorough rectal evaluation(?defecation dysfunction).   Taking Linzess 276mcg daily, MOM nightly, and prn Dulcolax PR/PO  Seeing GI regularly.   03/24/15 GI recommended to reduce Norco      Anemia of chronic disease    07/02/15 Hgb 12.3          Family/ staff Communication: referral to Psychiatrist ASAP  Labs/tests ordered:  CBC, CMP, TSH, UA C/S, KUB  ManXie Bionca Mckey NP [facility] [address], [place] [address] On Call:  [phone] & follow prompts after 5pm & weekends Office Phone:  [phone] Office Fax:  [phone]

## 2015-07-02 LAB — CBC AND DIFFERENTIAL
HCT: 38 % (ref 36–46)
HEMOGLOBIN: 12.3 g/dL (ref 12.0–16.0)
Platelets: 369 10*3/uL (ref 150–399)
WBC: 6.8 10^3/mL

## 2015-07-02 LAB — BASIC METABOLIC PANEL
BUN: 10 mg/dL (ref 4–21)
Creatinine: 0.8 mg/dL (ref 0.5–1.1)
GLUCOSE: 72 mg/dL
POTASSIUM: 4.4 mmol/L (ref 3.4–5.3)
SODIUM: 138 mmol/L (ref 137–147)

## 2015-07-02 LAB — HEPATIC FUNCTION PANEL
ALK PHOS: 123 U/L (ref 25–125)
ALT: 19 U/L (ref 7–35)
AST: 28 U/L (ref 13–35)
BILIRUBIN, TOTAL: 0.3 mg/dL

## 2015-07-02 LAB — TSH: TSH: 1.87 u[IU]/mL (ref 0.41–5.90)

## 2015-07-07 ENCOUNTER — Encounter: Payer: Self-pay | Admitting: Nurse Practitioner

## 2015-07-07 NOTE — Assessment & Plan Note (Signed)
Blood pressure is controlled, continue Furosemide 20mg , last Bun 10, creat 0.82 07/02/15.

## 2015-07-07 NOTE — Assessment & Plan Note (Signed)
07/02/15 Hgb 12.3

## 2015-07-07 NOTE — Assessment & Plan Note (Signed)
Occasionally c/o pain allover, takes Tylenol 650mg  nightly, daily Flexeril 5mg  tid, Norco 325/5 mg bid.

## 2015-07-07 NOTE — Assessment & Plan Note (Signed)
Complains pain/aches allover, then pointed to her stomach, denied nausea, vomiting, or constipation. CBC, CMP, TSH, KUB unremarkable, continue Famotidine 20mg  and prn Mintox 55ml q4h prn. Adding Carafate 10mg  po ac and hs.

## 2015-07-07 NOTE — Assessment & Plan Note (Signed)
With psychotic features, stated there is a chip inserted into her body,  f/u psychiatry regularly, takes Lorazepam 05mg  bid,  Amitriptyline 50mg  hs, and Chlordiazepoxide 25mg  hs, and Fluphenazine 1mg  daily.  Psych hospitalization x2 in the past.

## 2015-07-07 NOTE — Assessment & Plan Note (Signed)
03/18/14 Dr: Gessner Colonoscopy: severe diverticulosis was noted in the sigmoid colon, mild diverticulosis was noted in the right colon, the colon mucosa was otherwise normal.   If current regimen is not adequately treating constipation then she needs to return to see me for more thorough rectal evaluation(?defecation dysfunction).   Taking Linzess 290mcg daily, MOM nightly, and prn Dulcolax PR/PO  Seeing GI regularly.   03/24/15 GI recommended to reduce Norco   

## 2015-08-11 ENCOUNTER — Other Ambulatory Visit: Payer: Self-pay | Admitting: *Deleted

## 2015-08-11 MED ORDER — HYDROCODONE-ACETAMINOPHEN 5-325 MG PO TABS: ORAL_TABLET | ORAL | Status: DC

## 2015-08-11 NOTE — Telephone Encounter (Signed)
[facility] 

## 2015-09-06 ENCOUNTER — Other Ambulatory Visit: Payer: Self-pay | Admitting: *Deleted

## 2015-09-06 MED ORDER — HYDROCODONE-ACETAMINOPHEN 5-325 MG PO TABS: ORAL_TABLET | ORAL | Status: DC

## 2015-09-06 NOTE — Telephone Encounter (Signed)
[facility] 

## 2015-10-05 ENCOUNTER — Other Ambulatory Visit: Payer: Self-pay | Admitting: *Deleted

## 2015-10-05 MED ORDER — HYDROCODONE-ACETAMINOPHEN 5-325 MG PO TABS: ORAL_TABLET | ORAL | Status: DC

## 2015-10-05 NOTE — Telephone Encounter (Signed)
[facility] 

## 2015-10-18 ENCOUNTER — Other Ambulatory Visit: Payer: Self-pay | Admitting: *Deleted

## 2015-10-18 MED ORDER — LORAZEPAM 0.5 MG PO TABS: ORAL_TABLET | ORAL | Status: DC

## 2015-10-18 NOTE — Telephone Encounter (Signed)
[facility] 

## 2015-10-24 ENCOUNTER — Non-Acute Institutional Stay: Payer: Medicare Other | Admitting: Nurse Practitioner

## 2015-10-24 ENCOUNTER — Encounter: Payer: Self-pay | Admitting: Nurse Practitioner

## 2015-10-24 DIAGNOSIS — F418 Other specified anxiety disorders: Secondary | ICD-10-CM | POA: Diagnosis not present

## 2015-10-24 DIAGNOSIS — K5900 Constipation, unspecified: Secondary | ICD-10-CM

## 2015-10-24 DIAGNOSIS — Z8739 Personal history of other diseases of the musculoskeletal system and connective tissue: Secondary | ICD-10-CM

## 2015-10-24 DIAGNOSIS — I10 Essential (primary) hypertension: Secondary | ICD-10-CM

## 2015-10-24 DIAGNOSIS — K219 Gastro-esophageal reflux disease without esophagitis: Secondary | ICD-10-CM

## 2015-10-24 DIAGNOSIS — R609 Edema, unspecified: Secondary | ICD-10-CM | POA: Diagnosis not present

## 2015-10-24 NOTE — Assessment & Plan Note (Signed)
Occasionally c/o pain allover, takes Tylenol 650mg  nightly, prn Flexeril 5mg  tid, Norco 325/5 mg bid.

## 2015-10-24 NOTE — Progress Notes (Signed)
Patient ID: Jean [name], female   DOB: 10/03/1940, 75 y.o.   MRN: [id number]  Location:  [facility] Provider:  Marlana Latus NP  Code Status:  DNR Goals of care: Advanced Directive information Does patient have an advance directive?: Yes, Type of Advance Directive: Out of facility DNR (pink MOST or yellow form);Healthcare Power of Geographical information systems officer Complaint  Patient presents with  . Medical Management of Chronic Issues    Routine Visit     HPI: Patient is a 75 y.o. female seen in the [facility] at [facility] today for evaluation of constipation, has been stable, Hx of GERD, anxiety, depression, muscle and joints aches pains, HTN, chronic ankle edema.   Review of Systems:  Review of Systems  Constitutional: Negative for fever, chills and diaphoresis.  HENT: Positive for hearing loss and tinnitus. Negative for congestion, ear discharge, ear pain and nosebleeds.   Eyes: Negative for photophobia, pain and discharge.       Legally blind.   Respiratory: Positive for cough. Negative for shortness of breath and wheezing.   Cardiovascular: Negative for chest pain, palpitations and leg swelling.  Gastrointestinal: Positive for constipation. Negative for nausea, vomiting, abdominal pain and diarrhea.       C/o abd pain on and off. Not upon today's visit.   Genitourinary: Positive for frequency (adult depends). Negative for dysuria, urgency and flank pain.       Pressure in her bladder area.   Musculoskeletal: Negative for myalgias, back pain and neck pain.       C/o left hip, thigh, and knee pain with walking since her fall 01/13/15 New c/o of the lateral left lower leg pain and aches allover  Skin: Negative for rash.  Neurological: Positive for tremors (diffused rigidity. ). Negative for dizziness, seizures, weakness (generalized) and headaches.       Lips smacking  Psychiatric/Behavioral: Negative for suicidal ideas. The patient is nervous/anxious.        Hx of delusion, well controlled.  Stated she wakes up a few times at night, but no difficulty return to sleep.     Past Medical History  Diagnosis Date  . Allergy   . Asthma   . Esophageal reflux   . Dysfunction of eustachian tube   . Irritable bowel syndrome with constipation   . Diverticulosis   . Peptic ulcer disease   . Osteoporosis   . Peripheral neuropathy ([place])   . Trigeminal neuralgia     prior injury  . 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 Vitamin D deficiency   . HLD (hyperlipidemia)   . Obesity   . Anxiety   . Depression   . Optic neuritis   . Hemorrhoids   . History of rectal polyps   . HTN (hypertension)   . CAD (coronary artery disease)   . Elevated LFTs   . Hypertonicity of bladder   . Anemia   . Esophageal stricture 2002    Patient Active Problem List   Diagnosis Date Noted  . Rib pain on right side 02/17/2015  . UTI (urinary tract infection) 01/27/2015  . Tinnitus of both ears 01/21/2015  . Meniscus tear 01/21/2015  . Other constipation 08/06/2014  . Rectocele 06/28/2014  . Pelvic floor dysfunction 06/28/2014  . Diverticulitis of colon (without mention of hemorrhage) 06/10/2014  . Change in bowel habits 03/02/2014  . Constipation 03/02/2014  . Hemorrhoids 02/11/2014  . Cough  10/01/2013  . Anemia of chronic disease 10/01/2013  . Liver enzyme elevation 04/13/2013  . Hypertonicity of bladder 04/13/2013  . GERD (gastroesophageal reflux disease) 04/13/2013  . Closed fracture of unspecified part of upper end of humerus 04/02/2013  . Carotid artery disease ([place]) 12/31/2011  . Constipation 02/23/2011  . OPTIC NEURITIS 05/11/2010  . OVERWEIGHT 05/05/2009  . LATERAL EPICONDYLITIS OF ELBOW 05/05/2009  . OSTEOPOROSIS 05/05/2009  . DIVERTICULOSIS-COLON 12/02/2008  . Irritable bowel syndrome 12/02/2008  . RECTAL POLYPS 12/02/2008  . Abdominal pain, generalized 12/02/2008  . History of muscle pain 08/17/2008  . PERIPHERAL  EDEMA 08/17/2008  . VITAMIN D DEFICIENCY 05/19/2008  . Hyperlipidemia 05/19/2008  . SKIN RASH 05/14/2008  . Anxiety state 01/06/2008  . Depression with anxiety 01/06/2008  . INTERNAL HEMORRHOIDS 01/06/2008  . EXTERNAL HEMORRHOIDS 01/06/2008  . Essential hypertension 11/20/2007  . EUSTACHIAN TUBE DYSFUNCTION 08/01/2007  . Seasonal and perennial allergic rhinitis 08/01/2007  . Asthma, mild intermittent 08/01/2007    Allergies  Allergen Reactions  . Azithromycin   . Biaxin [Clarithromycin]   . Clarithromycin   . Doxycycline   . Fosamax [Alendronate Sodium]   . Geodon [Ziprasidone Hydrochloride]   . Lipitor [Atorvastatin Calcium]   . Penicillins   . Pravachol [Pravastatin Sodium]   . Statins   . Sulfonamide Derivatives   . Zocor [Simvastatin]     Medications: Patient's Medications  New Prescriptions   No medications on file  Previous Medications   ACETAMINOPHEN (TYLENOL) 325 MG TABLET    Take 650 mg by mouth at bedtime. And q6hr prn   ALUM & MAG HYDROXIDE-SIMETH (MYLANTA) [id number] MG/5ML SUSPENSION    Take by mouth. Take 21ml every 4 hours as needed for indigestion   AMITRIPTYLINE (ELAVIL) 50 MG TABLET    Take 50 mg by mouth at bedtime.   ASPIRIN 81 MG EC TABLET    Take 81 mg by mouth daily.     BISACODYL (BISACODYL) 5 MG EC TABLET    Take 5 mg by mouth. Take one tablet by mouth every day as needed for constipation   BISACODYL (DULCOLAX) 10 MG SUPPOSITORY    Place 10 mg rectally. Insert 1 supp rectally every day as needed for constipation   CALCIUM CARBONATE-VITAMIN D (CALTRATE 600+D) 600-400 MG-UNIT PER TABLET    Take 1 tablet by mouth 2 (two) times daily. 250/125   CHLORDIAZEPOXIDE (LIBRIUM) 25 MG CAPSULE    Take one capsule by mouth at bedtime for anxiety   CYCLOBENZAPRINE (FLEXERIL) 5 MG TABLET    Take 5 mg by mouth 3 (three) times daily as needed for muscle spasms.   FAMOTIDINE (PEPCID) 20 MG TABLET    Take 20 mg by mouth at bedtime.   FLUTICASONE (FLONASE) 50 MCG/ACT  NASAL SPRAY    Place 1 spray into both nostrils 2 (two) times daily.   FUROSEMIDE (LASIX) 20 MG TABLET    Take 20 mg by mouth every morning. 1 by mouth every am diuretic for peripheral edema    HYDROCODONE-ACETAMINOPHEN (NORCO/VICODIN) 5-325 MG TABLET    Take one tablet by mouth twice daily for pain   LINACLOTIDE (LINZESS) 290 MCG CAPS CAPSULE    Take 290 mcg by mouth daily.   LORAZEPAM (ATIVAN) 0.5 MG TABLET    Take one tablet by mouth twice daily for anxiety   MAGNESIUM HYDROXIDE (MILK OF MAGNESIA) 400 MG/5ML SUSPENSION    Take by mouth. Take 30 ml by mouth every day as needed for constipation Take with prune juice  at bedtime   MULTIPLE VITAMINS-MINERALS (CENTRUM SILVER PO)    Take 1 tablet by mouth.     OMEPRAZOLE (PRILOSEC) 20 MG CAPSULE    Take 20 mg by mouth daily.   POLYVINYL ALCOHOL (LIQUIFILM TEARS) 1.4 % OPHTHALMIC SOLUTION    Place 1 drop into both eyes 3 (three) times daily.   RISPERIDONE (RISPERDAL) 2 MG TABLET    Take 2 mg by mouth at bedtime.  Modified Medications   No medications on file  Discontinued Medications   AMITRIPTYLINE (ELAVIL) 75 MG TABLET    Take 50 mg by mouth at bedtime. Reported on 10/24/2015   FLUPHENAZINE (PROLIXIN) 1 MG TABLET    Take 1 mg by mouth at bedtime.   PROBIOTIC PRODUCT (VSL#3) CAPS    Take 2 capsules by mouth daily.   SENNA (SENOKOT) 8.6 MG TABS TABLET    Take 2 tablets (17.2 mg total) by mouth at bedtime.   SODIUM PHOSPHATE (FLEET) 7-19 GM/118ML ENEM    Place 1 enema rectally. One rectally daily as needed    Physical Exam: There were no vitals filed for this visit. There is no weight on file to calculate BMI.  Physical Exam  Constitutional: She is oriented to person, place, and time. She appears well-developed and well-nourished. No distress.  HENT:  Head: Normocephalic and atraumatic.  Right Ear: External ear normal. No lacerations. No drainage, swelling or tenderness. No foreign bodies. No mastoid tenderness. Tympanic membrane is not  injected, not scarred, not perforated, not erythematous, not retracted and not bulging. No middle ear effusion. No hemotympanum. Decreased hearing is noted.  Left Ear: External ear normal. No lacerations. No drainage, swelling or tenderness. No foreign bodies. No mastoid tenderness. Tympanic membrane is not injected, not scarred, not perforated, not erythematous, not retracted and not bulging.  No middle ear effusion. No hemotympanum. Decreased hearing is noted.  Eyes: Conjunctivae and EOM are normal. Pupils are equal, round, and reactive to light.  Neck: Neck supple.  Cardiovascular: Normal rate, regular rhythm and normal heart sounds.   Pulmonary/Chest: Effort normal and breath sounds normal. No respiratory distress. She has no rales. She exhibits no tenderness.  Abdominal: Soft. Bowel sounds are normal. She exhibits no distension. There is no tenderness. There is no rebound and no guarding.  External hemorrhoidsx2-not inflamed from previous exam.   Genitourinary: Guaiac negative stool.  No hard stools in her rectal vault.   Musculoskeletal: She exhibits no edema (trace) or tenderness.       Left shoulder: She exhibits decreased range of motion, bony tenderness, swelling and pain. She exhibits no effusion, no crepitus, no deformity, no laceration, no spasm, normal pulse and normal strength.  Left proximal humerus fx-healed C/o lateral left lower leg pain with walking.   Neurological: She is alert and oriented to person, place, and time. She has normal reflexes. No cranial nerve deficit.  involuntary lip movements.   Skin: Skin is warm and dry.  Psychiatric: Her mood appears not anxious. Her affect is not angry, not blunt, not labile and not inappropriate. Her speech is not rapid and/or pressured, not delayed, not tangential and not slurred. She is slowed. She is not agitated, not hyperactive, not withdrawn, not actively hallucinating and not combative. Thought content is not paranoid and not  delusional. Cognition and memory are impaired. She does not express impulsivity or inappropriate judgment. She exhibits a depressed mood. She expresses no homicidal and no suicidal ideation. She expresses no suicidal plans and no homicidal plans. She  is communicative. She exhibits abnormal recent memory.  A little anxious/flat affect/slow in responding, no frank hallucinosis.  She is attentive.    Labs reviewed: Basic Metabolic Panel:  Recent Labs  01/22/15 07/02/15  NA 139 138  K 4.2 4.4  BUN 15 10  CREATININE 0.9 0.8    Liver Function Tests:  Recent Labs  01/22/15 07/02/15  AST 22 28  ALT 16 19  ALKPHOS 141* 123    CBC:  Recent Labs  01/22/15 07/02/15  WBC 7.1 6.8  HGB 12.5 12.3  HCT 38 38  PLT 351 369    Lab Results  Component Value Date   TSH 1.87 07/02/2015   Lab Results  Component Value Date   HGBA1C * 12/31/2010    6.0 (NOTE)                                                                       According to the ADA Clinical Practice Recommendations for 2011, when HbA1c is used as a screening test:   >=6.5%   Diagnostic of Diabetes Mellitus           (if abnormal result  is confirmed)  5.7-6.4%   Increased risk of developing Diabetes Mellitus  References:Diagnosis and Classification of Diabetes Mellitus,Diabetes [id number] 1):S62-S69 and Standards of Medical Care in         Diabetes - 2011,Diabetes Care,2011,34  (Suppl 1):S11-S61.   Lab Results  Component Value Date   CHOL 155 04/04/2013   HDL 53 04/04/2013   LDLCALC 80 04/04/2013   LDLDIRECT 190.0 03/24/2012   TRIG 108 04/04/2013   CHOLHDL 4 03/24/2012    Significant Diagnostic Results since last visit: none  Patient Care Team: Estill Dooms, MD as PCP - General (Internal Medicine) [name], MD (Ophthalmology) [name], MD (Psychiatry) Man Mast X, NP as Nurse Practitioner (Nurse Practitioner)  Assessment/Plan Problem List Items Addressed This Visit    PERIPHERAL EDEMA     Only trace edema seen in ankles, continue Furosemide 20mg  daily.       History of muscle pain    Occasionally c/o pain allover, takes Tylenol 650mg  nightly, prn Flexeril 5mg  tid, Norco 325/5 mg bid.       GERD (gastroesophageal reflux disease)    Stable, continue Famotidine 20mg , Omeprazole 20mg ,  prn Mintox 27ml q4h prn.       Relevant Medications   famotidine (PEPCID) 20 MG tablet   Essential hypertension    Blood pressure is controlled, continue Furosemide 20mg , last Bun 10, creat 0.82 07/02/15.       Depression with anxiety    With psychotic features, hx of c/o  there is a chip inserted into her body,  f/u psychiatry regularly, takes Lorazepam 05mg  bid,  Amitriptyline 50mg  hs, and Chlordiazepoxide 25mg  hs, and Risperdal 2mg  daily.  Psych hospitalization x2 in the past.        Constipation - Primary    03/18/14 Dr: Carlean Purl Colonoscopy: severe diverticulosis was noted in the sigmoid colon, mild diverticulosis was noted in the right colon, the colon mucosa was otherwise normal.   If current regimen is not adequately treating constipation then she needs to return to see me for more thorough rectal evaluation(?defecation dysfunction).   Seeing  GI regularly.   03/24/15 GI recommended to reduce Norco  1/301/7 stable, continue Linzess 240mcg daily, prn MOM,  Dulcolax PR/PO          Family/ staff Communication: referral to Psychiatrist ASAP  Labs/tests ordered:  CBC, CMP, TSH, UA C/S, KUB  ManXie Mast NP [facility] [address], [place] [address] On Call:  [phone] & follow prompts after 5pm & weekends Office Phone:  [phone] Office Fax:  [phone]

## 2015-10-24 NOTE — Assessment & Plan Note (Signed)
03/18/14 Dr: Carlean Purl Colonoscopy: severe diverticulosis was noted in the sigmoid colon, mild diverticulosis was noted in the right colon, the colon mucosa was otherwise normal.   If current regimen is not adequately treating constipation then she needs to return to see me for more thorough rectal evaluation(?defecation dysfunction).   Seeing GI regularly.   03/24/15 GI recommended to reduce Norco  1/301/7 stable, continue Linzess 256mcg daily, prn MOM,  Dulcolax PR/PO

## 2015-10-24 NOTE — Assessment & Plan Note (Signed)
With psychotic features, hx of c/o  there is a chip inserted into her body,  f/u psychiatry regularly, takes Lorazepam 05mg  bid,  Amitriptyline 50mg  hs, and Chlordiazepoxide 25mg  hs, and Risperdal 2mg  daily.  Psych hospitalization x2 in the past.

## 2015-10-24 NOTE — Assessment & Plan Note (Signed)
Only trace edema seen in ankles, continue Furosemide 20mg daily.  

## 2015-10-24 NOTE — Assessment & Plan Note (Signed)
Stable, continue Famotidine 20mg , Omeprazole 20mg ,  prn Mintox 66ml q4h prn.

## 2015-10-24 NOTE — Assessment & Plan Note (Signed)
Blood pressure is controlled, continue Furosemide 20mg, last Bun 10, creat 0.82 07/02/15.  

## 2015-12-01 ENCOUNTER — Encounter: Payer: Self-pay | Admitting: Nurse Practitioner

## 2015-12-01 ENCOUNTER — Non-Acute Institutional Stay: Payer: Medicare Other | Admitting: Nurse Practitioner

## 2015-12-01 DIAGNOSIS — R609 Edema, unspecified: Secondary | ICD-10-CM

## 2015-12-01 DIAGNOSIS — K5900 Constipation, unspecified: Secondary | ICD-10-CM | POA: Diagnosis not present

## 2015-12-01 DIAGNOSIS — K219 Gastro-esophageal reflux disease without esophagitis: Secondary | ICD-10-CM

## 2015-12-01 DIAGNOSIS — I10 Essential (primary) hypertension: Secondary | ICD-10-CM | POA: Diagnosis not present

## 2015-12-01 DIAGNOSIS — Z8739 Personal history of other diseases of the musculoskeletal system and connective tissue: Secondary | ICD-10-CM | POA: Diagnosis not present

## 2015-12-01 DIAGNOSIS — F418 Other specified anxiety disorders: Secondary | ICD-10-CM | POA: Diagnosis not present

## 2015-12-01 NOTE — Assessment & Plan Note (Signed)
Stable, continue Famotidine 20mg , Omeprazole 20mg ,  prn Mintox 66ml q4h prn.

## 2015-12-01 NOTE — Assessment & Plan Note (Signed)
03/18/14 Dr: Carlean Purl Colonoscopy: severe diverticulosis was noted in the sigmoid colon, mild diverticulosis was noted in the right colon, the colon mucosa was otherwise normal.   If current regimen is not adequately treating constipation then she needs to return to see me for more thorough rectal evaluation(?defecation dysfunction).   Seeing GI regularly.   03/24/15 GI recommended to reduce Norco  1/301/7 stable, continue Linzess 21mcg daily, prn MOM,  Dulcolax PR/PO  12/01/15 still c/o constipation, last BM this am. F/u GI

## 2015-12-01 NOTE — Assessment & Plan Note (Signed)
Blood pressure is controlled, continue Furosemide 20mg, last Bun 10, creat 0.82 07/02/15.  

## 2015-12-01 NOTE — Assessment & Plan Note (Signed)
With psychotic features, hx of c/o  there is a chip inserted into her body,  f/u psychiatry regularly, takes Lorazepam 05mg  bid,  Amitriptyline 50mg  hs, and Prolixin 1mg  hs.  Psych hospitalization x2 in the past.

## 2015-12-01 NOTE — Assessment & Plan Note (Signed)
Occasionally c/o pain allover, takes Tylenol 650mg  nightly, prn Flexeril 5mg  tid, Norco 325/5 mg bid.

## 2015-12-01 NOTE — Progress Notes (Signed)
Patient ID: Jean [name], female   DOB: 14-Mar-1941, 75 y.o.   MRN: [id number]  Location:  [place] Room Number: [id number] Place of Service: [facility] Provider:  Lennie Odor Dione Mccombie NP  Estill Dooms, MD  Patient Care Team: Estill Dooms, MD as PCP - General (Internal Medicine) [name], MD (Ophthalmology) [name], MD (Psychiatry) Niyla Marone X, NP as Nurse Practitioner (Nurse Practitioner)  Extended Emergency Contact Information Primary Emergency Contact: [name], [place] Work Phone: [phone] Relation: Son Secondary Emergency Contact: [name] [place] Phone: [phone] Relation: Sister  Code Status:  DNR Goals of care: Advanced Directive information Advanced Directives 12/01/2015  Does patient have an advance directive? Yes  Type of Paramedic of [place];Out of facility DNR (pink MOST or yellow form)  Does patient want to make changes to advanced directive? No - Patient declined  Copy of advanced directive(s) in chart? Yes     Chief Complaint  Patient presents with  . Medical Management of Chronic Issues    Routine visit    HPI:  Pt is a 75 y.o. female seen today for medical management of chronic diseases.  Hx of constipation, has been stable, f/u GI,  Hx of GERD, stable while on Famotidine and Omeprazole, anxiety, depression, mood has been stable while on Amitriptyline 50mg , Librium 25mg , Lorazepam bid, muscle and joints aches pains, pain is managed with Norco bid and Flexeril prn HTN, controlled,  chronic ankle edema is minimal while taking Furosemide 20mg .     Past Medical History  Diagnosis Date  . Allergy   . Asthma   . Esophageal reflux   . Dysfunction of eustachian tube   . Irritable bowel syndrome with constipation   . Diverticulosis   . Peptic ulcer disease   . Osteoporosis   . Peripheral neuropathy ([place])   . Trigeminal neuralgia       prior injury  . 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 Vitamin D deficiency   . HLD (hyperlipidemia)   . Obesity   . Anxiety   . Depression   . Optic neuritis   . Hemorrhoids   . History of rectal polyps   . HTN (hypertension)   . CAD (coronary artery disease)   . Elevated LFTs   . Hypertonicity of bladder   . Anemia   . Esophageal stricture 2002   Past Surgical History  Procedure Laterality Date  . Appendectomy    . Abdominal hysterectomy    . Tonsillectomy    . Carotid endarterectomy    . Colonoscopy  2008, 2015  . Upper gastrointestinal endoscopy  2002  . Anal rectal manometry N/A 07/05/2014    Procedure: ANO RECTAL MANOMETRY;  Surgeon: Leighton Ruff, MD;  Location: [facility];  Service: Endoscopy;  Laterality: N/A;    Allergies  Allergen Reactions  . Azithromycin   . Biaxin [Clarithromycin]   . Clarithromycin   . Doxycycline   . Fosamax [Alendronate Sodium]   . Geodon [Ziprasidone Hydrochloride]   . Lipitor [Atorvastatin Calcium]   . Penicillins   . Pravachol [Pravastatin Sodium]   . Statins   . Sulfonamide Derivatives   . Zocor [Simvastatin]       Medication List       This list is accurate as of: 12/01/15  5:55 PM.  Always use your most recent med list.               acetaminophen 325 MG tablet  Commonly known as:  TYLENOL  Take 650 mg by mouth at bedtime. And q6hr prn     amitriptyline 50 MG tablet  Commonly known as:  ELAVIL  Take 50 mg by mouth at bedtime.     aspirin 81 MG EC tablet  Take 81 mg by mouth daily.     bisacodyl 10 MG suppository  Commonly known as:  DULCOLAX  Place 10 mg rectally. Insert 1 supp rectally every day as needed for constipation     bisacodyl 5 MG EC tablet  Generic drug:  bisacodyl  Take 5 mg by mouth. Take one tablet by mouth every day as needed for constipation     calcium-vitamin D 250-125 MG-UNIT tablet  Commonly known as:  OSCAL WITH D   Take 1 tablet by mouth 2 (two) times daily.     CENTRUM SILVER PO  Take 1 tablet by mouth.     chlordiazePOXIDE 25 MG capsule  Commonly known as:  LIBRIUM  Take one capsule by mouth at bedtime for anxiety     cyclobenzaprine 5 MG tablet  Commonly known as:  FLEXERIL  Take 5 mg by mouth 3 (three) times daily as needed for muscle spasms.     famotidine 20 MG tablet  Commonly known as:  PEPCID  Take 20 mg by mouth at bedtime.     fluPHENAZine 1 MG tablet  Commonly known as:  PROLIXIN  Take 1 mg by mouth at bedtime.     fluticasone 50 MCG/ACT nasal spray  Commonly known as:  FLONASE  Place 1 spray into both nostrils 2 (two) times daily.     furosemide 20 MG tablet  Commonly known as:  LASIX  Take 20 mg by mouth every morning. 1 by mouth every am diuretic for peripheral edema     HYDROcodone-acetaminophen 5-325 MG tablet  Commonly known as:  NORCO/VICODIN  Take one tablet by mouth twice daily for pain     LINZESS 290 MCG Caps capsule  Generic drug:  Linaclotide  Take 290 mcg by mouth daily.     LORazepam 0.5 MG tablet  Commonly known as:  ATIVAN  Take one tablet by mouth twice daily for anxiety     magnesium hydroxide 400 MG/5ML suspension  Commonly known as:  MILK OF MAGNESIA  Take by mouth. Take 30 ml by mouth every day as needed for constipation Take with prune juice at bedtime     MYLANTA [id number] MG/5ML suspension  Generic drug:  alum & mag hydroxide-simeth  Take by mouth. Take 60ml every 4 hours as needed for indigestion     omeprazole 20 MG capsule  Commonly known as:  PRILOSEC  Take 20 mg by mouth daily.     polyvinyl alcohol 1.4 % ophthalmic solution  Commonly known as:  LIQUIFILM TEARS  Place 1 drop into both eyes 3 (three) times daily.        Review of Systems  Constitutional: Negative for fever, chills and diaphoresis.  HENT: Positive for hearing loss and tinnitus. Negative for congestion, ear discharge, ear pain and nosebleeds.   Eyes:  Negative for photophobia, pain and discharge.       Legally blind.   Respiratory: Positive for cough. Negative for shortness of breath and wheezing.   Cardiovascular: Negative for chest pain, palpitations and leg swelling.  Gastrointestinal: Positive  for constipation. Negative for nausea, vomiting, abdominal pain and diarrhea.       C/o abd pain on and off. Not upon today's visit.   Genitourinary: Positive for frequency (adult depends). Negative for dysuria, urgency and flank pain.       Pressure in her bladder area.   Musculoskeletal: Negative for myalgias, back pain and neck pain.       C/o left hip, thigh, and knee pain with walking since her fall 01/13/15 New c/o of the lateral left lower leg pain and aches allover  Skin: Negative for rash.  Neurological: Positive for tremors (diffused rigidity. ). Negative for dizziness, seizures, weakness (generalized) and headaches.       Lips smacking  Psychiatric/Behavioral: Negative for suicidal ideas. The patient is nervous/anxious.        Hx of delusion, well controlled. Stated she wakes up a few times at night, but no difficulty return to sleep.     Immunization History  Administered Date(s) Administered  . Influenza Split 07/23/2013  . Influenza Whole 07/05/2009  . Influenza-Unspecified 07/29/2014, 06/14/2015  . PPD Test 06/17/2013  . Pneumococcal Polysaccharide-23 05/05/2009  . Td 05/05/2009  . Zoster 01/09/2011   Pertinent  Health Maintenance Due  Topic Date Due  . DEXA SCAN  07/12/2004  . PNA vac Low Risk Adult (2 of 2 - PCV13) 05/05/2010  . INFLUENZA VACCINE  04/24/2016   Fall Risk  07/29/2014  Falls in the past year? No   Functional Status Survey:    Filed Vitals:   12/01/15 0943  BP: 136/71  Pulse: 70  Temp: 96.5 F (35.8 C)  TempSrc: Oral  Resp: 18  Height: 5\' 1"  (1.549 m)  Weight: 149 lb 6.4 oz (67.767 kg)   Body mass index is 28.24 kg/(m^2). Physical Exam  Constitutional: She is oriented to person, place, and  time. She appears well-developed and well-nourished. No distress.  HENT:  Head: Normocephalic and atraumatic.  Right Ear: External ear normal. No lacerations. No drainage, swelling or tenderness. No foreign bodies. No mastoid tenderness. Tympanic membrane is not injected, not scarred, not perforated, not erythematous, not retracted and not bulging. No middle ear effusion. No hemotympanum. Decreased hearing is noted.  Left Ear: External ear normal. No lacerations. No drainage, swelling or tenderness. No foreign bodies. No mastoid tenderness. Tympanic membrane is not injected, not scarred, not perforated, not erythematous, not retracted and not bulging.  No middle ear effusion. No hemotympanum. Decreased hearing is noted.  Eyes: Conjunctivae and EOM are normal. Pupils are equal, round, and reactive to light.  Neck: Neck supple.  Cardiovascular: Normal rate, regular rhythm and normal heart sounds.   Pulmonary/Chest: Effort normal and breath sounds normal. No respiratory distress. She has no rales. She exhibits no tenderness.  Abdominal: Soft. Bowel sounds are normal. She exhibits no distension. There is no tenderness. There is no rebound and no guarding.  External hemorrhoidsx2-not inflamed from previous exam.   Genitourinary: Guaiac negative stool.  No hard stools in her rectal vault.   Musculoskeletal: She exhibits no edema (trace) or tenderness.       Left shoulder: She exhibits decreased range of motion, bony tenderness, swelling and pain. She exhibits no effusion, no crepitus, no deformity, no laceration, no spasm, normal pulse and normal strength.  Left proximal humerus fx-healed C/o lateral left lower leg pain with walking.   Neurological: She is alert and oriented to person, place, and time. She has normal reflexes. No cranial nerve deficit.  involuntary lip movements.  Skin: Skin is warm and dry.  Psychiatric: Her mood appears not anxious. Her affect is not angry, not blunt, not labile  and not inappropriate. Her speech is not rapid and/or pressured, not delayed, not tangential and not slurred. She is slowed. She is not agitated, not hyperactive, not withdrawn, not actively hallucinating and not combative. Thought content is not paranoid and not delusional. Cognition and memory are impaired. She does not express impulsivity or inappropriate judgment. She exhibits a depressed mood. She expresses no homicidal and no suicidal ideation. She expresses no suicidal plans and no homicidal plans. She is communicative. She exhibits abnormal recent memory.  A little anxious/flat affect/slow in responding, no frank hallucinosis.  She is attentive.    Labs reviewed:  Recent Labs  01/22/15 07/02/15  NA 139 138  K 4.2 4.4  BUN 15 10  CREATININE 0.9 0.8    Recent Labs  01/22/15 07/02/15  AST 22 28  ALT 16 19  ALKPHOS 141* 123    Recent Labs  01/22/15 07/02/15  WBC 7.1 6.8  HGB 12.5 12.3  HCT 38 38  PLT 351 369   Lab Results  Component Value Date   TSH 1.87 07/02/2015   Lab Results  Component Value Date   HGBA1C * 12/31/2010    6.0 (NOTE)                                                                       According to the ADA Clinical Practice Recommendations for 2011, when HbA1c is used as a screening test:   >=6.5%   Diagnostic of Diabetes Mellitus           (if abnormal result  is confirmed)  5.7-6.4%   Increased risk of developing Diabetes Mellitus  References:Diagnosis and Classification of Diabetes Mellitus,Diabetes [id number] 1):S62-S69 and Standards of Medical Care in         Diabetes - 2011,Diabetes Care,2011,34  (Suppl 1):S11-S61.   Lab Results  Component Value Date   CHOL 155 04/04/2013   HDL 53 04/04/2013   LDLCALC 80 04/04/2013   LDLDIRECT 190.0 03/24/2012   TRIG 108 04/04/2013   CHOLHDL 4 03/24/2012    Significant Diagnostic Results in last 30 days:  No results found.  Assessment/Plan  Constipation 03/18/14 Dr: Carlean Purl Colonoscopy:  severe diverticulosis was noted in the sigmoid colon, mild diverticulosis was noted in the right colon, the colon mucosa was otherwise normal.   If current regimen is not adequately treating constipation then she needs to return to see me for more thorough rectal evaluation(?defecation dysfunction).   Seeing GI regularly.   03/24/15 GI recommended to reduce Norco  1/301/7 stable, continue Linzess 261mcg daily, prn MOM,  Dulcolax PR/PO  12/01/15 still c/o constipation, last BM this am. F/u GI  PERIPHERAL EDEMA Only trace edema seen in ankles, continue Furosemide 20mg  daily.   History of muscle pain Occasionally c/o pain allover, takes Tylenol 650mg  nightly, prn Flexeril 5mg  tid, Norco 325/5 mg bid.  GERD (gastroesophageal reflux disease) Stable, continue Famotidine 20mg , Omeprazole 20mg ,  prn Mintox 40ml q4h prn.   Essential hypertension Blood pressure is controlled, continue Furosemide 20mg , last Bun 10, creat 0.82 07/02/15.  Depression with anxiety With psychotic features, hx of c/o  there  is a chip inserted into her body,  f/u psychiatry regularly, takes Lorazepam 05mg  bid,  Amitriptyline 50mg  hs, and Prolixin 1mg  hs.  Psych hospitalization x2 in the past.     Family/ staff Communication: continue [facility] for care needs.   Labs/tests ordered:  none

## 2015-12-01 NOTE — Assessment & Plan Note (Signed)
Only trace edema seen in ankles, continue Furosemide 20mg daily.  

## 2015-12-12 ENCOUNTER — Other Ambulatory Visit: Payer: Self-pay | Admitting: *Deleted

## 2015-12-12 MED ORDER — HYDROCODONE-ACETAMINOPHEN 5-325 MG PO TABS: ORAL_TABLET | ORAL | Status: DC

## 2015-12-12 NOTE — Telephone Encounter (Signed)
[facility] 

## 2015-12-27 ENCOUNTER — Encounter: Payer: Self-pay | Admitting: Internal Medicine

## 2015-12-27 ENCOUNTER — Ambulatory Visit (INDEPENDENT_AMBULATORY_CARE_PROVIDER_SITE_OTHER): Payer: Medicare Other | Admitting: Internal Medicine

## 2015-12-27 VITALS — BP 170/100 | HR 88 | Ht 61.0 in | Wt 154.4 lb

## 2015-12-27 DIAGNOSIS — K589 Irritable bowel syndrome without diarrhea: Secondary | ICD-10-CM

## 2015-12-27 DIAGNOSIS — R140 Abdominal distension (gaseous): Secondary | ICD-10-CM | POA: Diagnosis not present

## 2015-12-27 DIAGNOSIS — N8184 Pelvic muscle wasting: Secondary | ICD-10-CM | POA: Diagnosis not present

## 2015-12-27 DIAGNOSIS — K648 Other hemorrhoids: Secondary | ICD-10-CM

## 2015-12-27 DIAGNOSIS — M6289 Other specified disorders of muscle: Secondary | ICD-10-CM

## 2015-12-27 MED ORDER — ALIGN 4 MG PO CAPS: 1.0000 | ORAL_CAPSULE | Freq: Every day | ORAL | Status: DC

## 2015-12-27 NOTE — Patient Instructions (Signed)
   Your problems are listed in the visit diagnoses.  1) start Align 1 each day 2) Stay on a low fiber diet  I appreciate the opportunity to care for you. Gatha Mayer, MD, [facility]

## 2015-12-27 NOTE — Progress Notes (Signed)
   Subjective:    Patient ID: Jean [name], female    DOB: Aug 09, 1941, 75 y.o.   MRN: [id number] Chief complaint: Bloating constipation HPI Patient is here for chronic problems with bloating and constipation. She says she can't fit into her close because of the bloating that she has gained 10 pounds in a year. Her problems seem to be chronic from the history, postprandial bloating and abdominal distention, constipation which is treated successfully with Linzess and prune juice however. Hemorrhoids will flare and get irritated from time to time. Her current bowel regimen does seem to be working her main problem is gas and bloating which she has had for a long time unfortunately.  Medications, allergies, past medical history, past surgical history, family history and social history are reviewed and updated in the EMR.  Review of Systems As above    Objective:   Physical Exam BP 170/100 mmHg  Pulse 88  Ht 5\' 1"  (1.549 m)  Wt 154 lb 6 oz (70.024 kg)  BMI 29.18 kg/m2 NAD + tardive dyskinesia abd obese, soft and NT, no ascites Ext no edema  I have reviewed nursing home visit notes, my last office visit of June 2016 October 2016 TSH liver chemistries basic metabolic panel and CBC which were all normal.     Assessment & Plan:   1. IBS (irritable bowel syndrome)   2. Pelvic floor dysfunction   3. Hemorrhoids with complication   4. Bloating    These are all chronic problems which I think are treated about as well as we can. I am going to try align 1 each day and put her on a low fiber diet to see if that helps. I don't think any imaging workup etc. is needed given the chronicity of the symptoms. I realize they are bothersome but I don't think there is much more we can do at this point in her situation.  CC: GREEN, Viviann Spare, MD

## 2016-02-02 ENCOUNTER — Telehealth: Payer: Self-pay | Admitting: Internal Medicine

## 2016-02-02 NOTE — Telephone Encounter (Signed)
Caller name: Luetta Nutting Relation to [phone] - [facility] Call back number: [phone]  Ext [phone]   Reason for call:  Sending over a fax that needs to be signed.  fyi

## 2016-02-02 NOTE — Telephone Encounter (Signed)
No, I'll go look on Dr. Celesta Aver desk.

## 2016-02-02 NOTE — Telephone Encounter (Signed)
I called and spoke with Helene Kelp.  She asked that I disregard she has the orders she needed

## 2016-02-02 NOTE — Telephone Encounter (Signed)
PJ have you seen anything on this?

## 2016-02-09 ENCOUNTER — Other Ambulatory Visit: Payer: Self-pay | Admitting: *Deleted

## 2016-02-09 ENCOUNTER — Non-Acute Institutional Stay: Payer: Medicare Other | Admitting: Nurse Practitioner

## 2016-02-09 ENCOUNTER — Encounter: Payer: Self-pay | Admitting: Nurse Practitioner

## 2016-02-09 DIAGNOSIS — K219 Gastro-esophageal reflux disease without esophagitis: Secondary | ICD-10-CM | POA: Diagnosis not present

## 2016-02-09 DIAGNOSIS — F418 Other specified anxiety disorders: Secondary | ICD-10-CM

## 2016-02-09 DIAGNOSIS — R609 Edema, unspecified: Secondary | ICD-10-CM | POA: Diagnosis not present

## 2016-02-09 DIAGNOSIS — K5900 Constipation, unspecified: Secondary | ICD-10-CM | POA: Diagnosis not present

## 2016-02-09 DIAGNOSIS — Z8739 Personal history of other diseases of the musculoskeletal system and connective tissue: Secondary | ICD-10-CM

## 2016-02-09 DIAGNOSIS — I10 Essential (primary) hypertension: Secondary | ICD-10-CM

## 2016-02-09 MED ORDER — HYDROCODONE-ACETAMINOPHEN 5-325 MG PO TABS: ORAL_TABLET | ORAL | Status: DC

## 2016-02-09 NOTE — Progress Notes (Signed)
Patient ID: Jean [name], female   DOB: [birth year], 75 y.o.   MRN: [id number]  Location:  [facility] Room Number: [id number] Place of Service: [facility] Provider:  Lennie Odor Danton Palmateer NP  Estill Dooms, MD  Patient Care Team: Estill Dooms, MD as PCP - General (Internal Medicine) [name], MD (Ophthalmology) [name], MD (Psychiatry) Argelio Granier X, NP as Nurse Practitioner (Nurse Practitioner)  Extended Emergency Contact Information Primary Emergency Contact: Jean [name], [place] Work Phone: [phone] Relation: Son Secondary Emergency Contact: Jean [name] [place] Phone: [phone] Relation: Sister  Code Status:  DNR Goals of care: Advanced Directive information Advanced Directives 02/09/2016  Does patient have an advance directive? Yes  Type of Paramedic of [place];Living will;Out of facility DNR (pink MOST or yellow form)  Does patient want to make changes to advanced directive? No - Patient declined  Copy of advanced directive(s) in chart? Yes     Chief Complaint  Patient presents with  . Medical Management of Chronic Issues    Routine Visit    HPI:  Pt is a 75 y.o. female seen today for medical management of chronic diseases.  Hx of constipation, has been stable, f/u GI,  Hx of GERD, stable while on Famotidine and Omeprazole, anxiety, depression, mood has been stable while on Amitriptyline 50mg , Librium 25mg , Lorazepam bid, muscle and joints aches pains, pain is managed with Norco bid and Flexeril prn HTN, controlled,  chronic ankle edema is minimal while taking Furosemide 20mg .   Past Medical History  Diagnosis Date  . Allergy   . Asthma   . Esophageal reflux   . Dysfunction of eustachian tube   . Irritable bowel syndrome with constipation   . Diverticulosis   . Peptic ulcer disease   . Osteoporosis   . Peripheral neuropathy ([place])   . Trigeminal  neuralgia     prior injury  . 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 Vitamin D deficiency   . HLD (hyperlipidemia)   . Obesity   . Anxiety   . Depression   . Optic neuritis   . Hemorrhoids   . History of rectal polyps   . HTN (hypertension)   . CAD (coronary artery disease)   . Elevated LFTs   . Hypertonicity of bladder   . Anemia   . Esophageal stricture 2002   Past Surgical History  Procedure Laterality Date  . Appendectomy    . Abdominal hysterectomy    . Tonsillectomy    . Carotid endarterectomy    . Colonoscopy  2008, 2015  . Upper gastrointestinal endoscopy  2002  . Anal rectal manometry N/A 07/05/2014    Procedure: ANO RECTAL MANOMETRY;  Surgeon: Leighton Ruff, MD;  Location: [facility];  Service: Endoscopy;  Laterality: N/A;    Allergies  Allergen Reactions  . Azithromycin   . Biaxin [Clarithromycin]   . Clarithromycin   . Doxycycline   . Fosamax [Alendronate Sodium]   . Geodon [Ziprasidone Hydrochloride]   . Lipitor [Atorvastatin Calcium]   . Penicillins   . Pravachol [Pravastatin Sodium]   . Statins   . Sulfonamide Derivatives   . Zocor [Simvastatin]       Medication List       This list is accurate as of: 02/09/16  3:14 PM.  Always  use your most recent med list.               acetaminophen 325 MG tablet  Commonly known as:  TYLENOL  Take 650 mg by mouth every 6 (six) hours as needed. And take 2 tabs by mouth at bedtime     ALIGN 4 MG Caps  Take 1 capsule (4 mg total) by mouth daily.     amitriptyline 50 MG tablet  Commonly known as:  ELAVIL  Take 50 mg by mouth at bedtime.     aspirin 81 MG EC tablet  Take 81 mg by mouth daily.     bisacodyl 10 MG suppository  Commonly known as:  DULCOLAX  Place 10 mg rectally. Insert 1 supp rectally every day as needed for constipation     bisacodyl 5 MG EC tablet  Generic drug:  bisacodyl  Take 5 mg by mouth. Take one tablet by  mouth every day as needed for constipation     calcium-vitamin D 250-125 MG-UNIT tablet  Commonly known as:  OSCAL WITH D  Take 1 tablet by mouth 2 (two) times daily. Reported on 12/27/2015     CENTRUM SILVER PO  Take 1 tablet by mouth.     chlordiazePOXIDE 25 MG capsule  Commonly known as:  LIBRIUM  Take one capsule by mouth at bedtime for anxiety     cyclobenzaprine 5 MG tablet  Commonly known as:  FLEXERIL  Take 5 mg by mouth 3 (three) times daily as needed for muscle spasms.     famotidine 20 MG tablet  Commonly known as:  PEPCID  Take 20 mg by mouth at bedtime.     fluPHENAZine 1 MG tablet  Commonly known as:  PROLIXIN  Take 1 mg by mouth at bedtime.     fluticasone 50 MCG/ACT nasal spray  Commonly known as:  FLONASE  Place 1 spray into both nostrils 2 (two) times daily.     furosemide 20 MG tablet  Commonly known as:  LASIX  1 by mouth every am diuretic for peripheral edema     HYDROcodone-acetaminophen 5-325 MG tablet  Commonly known as:  NORCO/VICODIN  Take one tablet by mouth twice daily for pain     LINZESS 290 MCG Caps capsule  Generic drug:  linaclotide  Take 290 mcg by mouth daily.     LORazepam 0.5 MG tablet  Commonly known as:  ATIVAN  Take one tablet by mouth twice daily for anxiety     magnesium hydroxide 400 MG/5ML suspension  Commonly known as:  MILK OF MAGNESIA  Take by mouth. Take 30 ml by mouth every day as needed for constipation Take with prune juice at bedtime     MYLANTA [id number] MG/5ML suspension  Generic drug:  alum & mag hydroxide-simeth  Take by mouth. Take 55ml every 4 hours as needed and in the evening at 9pm for indigestion     omeprazole 20 MG capsule  Commonly known as:  PRILOSEC  Take 20 mg by mouth daily.     polyvinyl alcohol 1.4 % ophthalmic solution  Commonly known as:  LIQUIFILM TEARS  Place 1 drop into both eyes 3 (three) times daily.        Review of Systems  Constitutional: Negative for fever, chills and  diaphoresis.  HENT: Positive for hearing loss and tinnitus. Negative for congestion, ear discharge, ear pain and nosebleeds.   Eyes: Negative for photophobia, pain and discharge.       Legally  blind.   Respiratory: Positive for cough. Negative for shortness of breath and wheezing.   Cardiovascular: Negative for chest pain, palpitations and leg swelling.  Gastrointestinal: Positive for constipation. Negative for nausea, vomiting, abdominal pain and diarrhea.       C/o abd pain on and off. Not upon today's visit.   Genitourinary: Positive for frequency (adult depends). Negative for dysuria, urgency and flank pain.       Pressure in her bladder area.   Musculoskeletal: Negative for myalgias, back pain and neck pain.       C/o left hip, thigh, and knee pain with walking since her fall 01/13/15 New c/o of the lateral left lower leg pain and aches allover  Skin: Negative for rash.  Neurological: Positive for tremors (diffused rigidity. ). Negative for dizziness, seizures, weakness (generalized) and headaches.       Lips smacking  Psychiatric/Behavioral: Negative for suicidal ideas. The patient is nervous/anxious.        Hx of delusion, well controlled. Stated she wakes up a few times at night, but no difficulty return to sleep.     Immunization History  Administered Date(s) Administered  . Influenza Split 07/23/2013  . Influenza Whole 07/05/2009  . Influenza-Unspecified 07/29/2014, 06/14/2015  . PPD Test 06/17/2013  . Pneumococcal Polysaccharide-23 05/05/2009  . Td 05/05/2009  . Zoster 01/09/2011   Pertinent  Health Maintenance Due  Topic Date Due  . DEXA SCAN  07/12/2004  . PNA vac Low Risk Adult (2 of 2 - PCV13) 05/05/2010  . INFLUENZA VACCINE  04/24/2016   Fall Risk  07/29/2014  Falls in the past year? No   Functional Status Survey:    Filed Vitals:   02/09/16 1032  BP: 126/62  Pulse: 64  Temp: 97.5 F (36.4 C)  TempSrc: Oral  Resp: 16  Height: 5\' 1"  (1.549 m)  Weight:  149 lb 9.6 oz (67.858 kg)   Body mass index is 28.28 kg/(m^2). Physical Exam  Constitutional: She is oriented to person, place, and time. She appears well-developed and well-nourished. No distress.  HENT:  Head: Normocephalic and atraumatic.  Right Ear: External ear normal. No lacerations. No drainage, swelling or tenderness. No foreign bodies. No mastoid tenderness. Tympanic membrane is not injected, not scarred, not perforated, not erythematous, not retracted and not bulging. No middle ear effusion. No hemotympanum. Decreased hearing is noted.  Left Ear: External ear normal. No lacerations. No drainage, swelling or tenderness. No foreign bodies. No mastoid tenderness. Tympanic membrane is not injected, not scarred, not perforated, not erythematous, not retracted and not bulging.  No middle ear effusion. No hemotympanum. Decreased hearing is noted.  Eyes: Conjunctivae and EOM are normal. Pupils are equal, round, and reactive to light.  Neck: Neck supple.  Cardiovascular: Normal rate, regular rhythm and normal heart sounds.   Pulmonary/Chest: Effort normal and breath sounds normal. No respiratory distress. She has no rales. She exhibits no tenderness.  Abdominal: Soft. Bowel sounds are normal. She exhibits no distension. There is no tenderness. There is no rebound and no guarding.  External hemorrhoidsx2-not inflamed from previous exam.   Genitourinary: Guaiac negative stool.  No hard stools in her rectal vault.   Musculoskeletal: She exhibits no edema (trace) or tenderness.       Left shoulder: She exhibits decreased range of motion, bony tenderness, swelling and pain. She exhibits no effusion, no crepitus, no deformity, no laceration, no spasm, normal pulse and normal strength.  Left proximal humerus fx-healed C/o lateral left lower leg  pain with walking.   Neurological: She is alert and oriented to person, place, and time. She has normal reflexes. No cranial nerve deficit.  involuntary lip  movements.   Skin: Skin is warm and dry.  Psychiatric: Her mood appears not anxious. Her affect is not angry, not blunt, not labile and not inappropriate. Her speech is not rapid and/or pressured, not delayed, not tangential and not slurred. She is slowed. She is not agitated, not hyperactive, not withdrawn, not actively hallucinating and not combative. Thought content is not paranoid and not delusional. Cognition and memory are impaired. She does not express impulsivity or inappropriate judgment. She exhibits a depressed mood. She expresses no homicidal and no suicidal ideation. She expresses no suicidal plans and no homicidal plans. She is communicative. She exhibits abnormal recent memory.  A little anxious/flat affect/slow in responding, no frank hallucinosis.  She is attentive.    Labs reviewed:  Recent Labs  07/02/15  NA 138  K 4.4  BUN 10  CREATININE 0.8    Recent Labs  07/02/15  AST 28  ALT 19  ALKPHOS 123    Recent Labs  07/02/15  WBC 6.8  HGB 12.3  HCT 38  PLT 369   Lab Results  Component Value Date   TSH 1.87 07/02/2015   Lab Results  Component Value Date   HGBA1C * 12/31/2010    6.0 (NOTE)                                                                       According to the ADA Clinical Practice Recommendations for 2011, when HbA1c is used as a screening test:   >=6.5%   Diagnostic of Diabetes Mellitus           (if abnormal result  is confirmed)  5.7-6.4%   Increased risk of developing Diabetes Mellitus  References:Diagnosis and Classification of Diabetes Mellitus,Diabetes [id number] 1):S62-S69 and Standards of Medical Care in         Diabetes - 2011,Diabetes Care,2011,34  (Suppl 1):S11-S61.   Lab Results  Component Value Date   CHOL 155 04/04/2013   HDL 53 04/04/2013   LDLCALC 80 04/04/2013   LDLDIRECT 190.0 03/24/2012   TRIG 108 04/04/2013   CHOLHDL 4 03/24/2012    Significant Diagnostic Results in last 30 days:  No results  found.  Assessment/Plan  PERIPHERAL EDEMA Only trace edema seen in ankles, continue Furosemide 20mg  daily. Update CMP   History of muscle pain Occasionally c/o pain allover, takes Tylenol 650mg  nightly, prn Flexeril 5mg  tid, Norco 325/5 mg bid.    GERD (gastroesophageal reflux disease) Stable, continue Famotidine 20mg , Omeprazole 20mg ,  prn Mintox 37ml q4h prn.    Essential hypertension Blood pressure is controlled, continue Furosemide 20mg , last Bun 10, creat 0.82 07/02/15. Update CMP   Depression with anxiety With psychotic features, hx of c/o  there is a chip inserted into her body,  f/u psychiatry regularly, takes Lorazepam 05mg  bid,  Amitriptyline 50mg  hs, and Prolixin 1mg  hs.  Psych hospitalization x2 in the past.  02/09/16 update CBC, TSH, Hgb a1c   Constipation 03/18/14 Dr: Carlean Purl Colonoscopy: severe diverticulosis was noted in the sigmoid colon, mild diverticulosis was noted in the right colon, the colon mucosa was  otherwise normal.   If current regimen is not adequately treating constipation then she needs to return to see me for more thorough rectal evaluation(?defecation dysfunction).   Seeing GI regularly.   03/24/15 GI recommended to reduce Norco  1/301/7 stable, continue Linzess 275mcg daily, prn MOM,  Dulcolax PR/PO  12/01/15 still c/o constipation, last BM this am. F/u GI     Family/ staff Communication: continue [facility] for care needs.   Labs/tests ordered: CBC, CMP, TSH, Hgb a1c

## 2016-02-09 NOTE — Assessment & Plan Note (Signed)
03/18/14 Dr: Carlean Purl Colonoscopy: severe diverticulosis was noted in the sigmoid colon, mild diverticulosis was noted in the right colon, the colon mucosa was otherwise normal.   If current regimen is not adequately treating constipation then she needs to return to see me for more thorough rectal evaluation(?defecation dysfunction).   Seeing GI regularly.   03/24/15 GI recommended to reduce Norco  1/301/7 stable, continue Linzess 253mcg daily, prn MOM,  Dulcolax PR/PO  12/01/15 still c/o constipation, last BM this am. F/u GI

## 2016-02-09 NOTE — Assessment & Plan Note (Signed)
Occasionally c/o pain allover, takes Tylenol 650mg  nightly, prn Flexeril 5mg  tid, Norco 325/5 mg bid.

## 2016-02-09 NOTE — Assessment & Plan Note (Signed)
Only trace edema seen in ankles, continue Furosemide 20mg  daily. Update CMP

## 2016-02-09 NOTE — Assessment & Plan Note (Signed)
Blood pressure is controlled, continue Furosemide 20mg , last Bun 10, creat 0.82 07/02/15. Update CMP

## 2016-02-09 NOTE — Telephone Encounter (Signed)
[facility] 

## 2016-02-09 NOTE — Assessment & Plan Note (Signed)
Stable, continue Famotidine 20mg , Omeprazole 20mg ,  prn Mintox 68ml q4h prn.

## 2016-02-09 NOTE — Assessment & Plan Note (Signed)
With psychotic features, hx of c/o  there is a chip inserted into her body,  f/u psychiatry regularly, takes Lorazepam 05mg  bid,  Amitriptyline 50mg  hs, and Prolixin 1mg  hs.  Psych hospitalization x2 in the past.  02/09/16 update CBC, TSH, Hgb a1c

## 2016-02-14 LAB — TSH: TSH: 1.36 u[IU]/mL (ref <=5.90)

## 2016-02-14 LAB — HEPATIC FUNCTION PANEL
ALK PHOS: 117 U/L (ref 25–125)
ALT: 17 U/L (ref 7–35)
AST: 24 U/L (ref 13–35)
BILIRUBIN, TOTAL: 0.3 mg/dL

## 2016-02-14 LAB — CBC AND DIFFERENTIAL
HEMATOCRIT: 36 % (ref 36–46)
HEMOGLOBIN: 12.1 g/dL (ref 12.0–16.0)
Platelets: 320 10*3/uL (ref 150–399)
WBC: 6.9 10^3/mL

## 2016-02-14 LAB — HEMOGLOBIN A1C: Hemoglobin A1C: 6

## 2016-02-14 LAB — BASIC METABOLIC PANEL
BUN: 13 mg/dL (ref 4–21)
Creatinine: 1 mg/dL (ref <=1.1)
Glucose: 75 mg/dL
Potassium: 4.3 mmol/L (ref 3.4–5.3)
SODIUM: 139 mmol/L (ref 137–147)

## 2016-02-16 ENCOUNTER — Encounter: Payer: Self-pay | Admitting: *Deleted

## 2016-03-13 ENCOUNTER — Non-Acute Institutional Stay: Payer: Medicare Other | Admitting: Nurse Practitioner

## 2016-03-13 ENCOUNTER — Encounter: Payer: Self-pay | Admitting: Nurse Practitioner

## 2016-03-13 DIAGNOSIS — R21 Rash and other nonspecific skin eruption: Secondary | ICD-10-CM | POA: Diagnosis not present

## 2016-03-13 DIAGNOSIS — K2100 Gastro-esophageal reflux disease with esophagitis, without bleeding: Secondary | ICD-10-CM

## 2016-03-13 DIAGNOSIS — I10 Essential (primary) hypertension: Secondary | ICD-10-CM

## 2016-03-13 DIAGNOSIS — N318 Other neuromuscular dysfunction of bladder: Secondary | ICD-10-CM

## 2016-03-13 DIAGNOSIS — M25561 Pain in right knee: Secondary | ICD-10-CM | POA: Diagnosis not present

## 2016-03-13 DIAGNOSIS — K5900 Constipation, unspecified: Secondary | ICD-10-CM

## 2016-03-13 DIAGNOSIS — K5909 Other constipation: Secondary | ICD-10-CM

## 2016-04-15 DIAGNOSIS — M25561 Pain in right knee: Secondary | ICD-10-CM

## 2016-04-15 DIAGNOSIS — M25562 Pain in left knee: Secondary | ICD-10-CM

## 2016-04-15 DIAGNOSIS — G8929 Other chronic pain: Secondary | ICD-10-CM | POA: Insufficient documentation

## 2016-04-15 NOTE — Assessment & Plan Note (Signed)
03/18/14 Dr: Carlean Purl Colonoscopy: severe diverticulosis was noted in the sigmoid colon, mild diverticulosis was noted in the right colon, the colon mucosa was otherwise normal.   If current regimen is not adequately treating constipation then she needs to return to see me for more thorough rectal evaluation(?defecation dysfunction).   Seeing GI regularly.   03/24/15 GI recommended to reduce Norco  1/301/7 stable, continue Linzess 27mcg daily, prn MOM,  Dulcolax PR/PO  12/01/15 still c/o constipation, last BM this am. F/u GI  Chronic condition, has been managed by GI

## 2016-04-15 NOTE — Assessment & Plan Note (Signed)
Blood pressure is controlled, continue Furosemide 20mg , last Bun 10, creat 0.82 07/02/15

## 2016-04-15 NOTE — Progress Notes (Signed)
Patient ID: Jean [name], female   DOB: [birth year], 75 y.o.   MRN: [id number]  Location:    [facility] Room Number: [id number] Place of Service: [facility] Provider:  [facility] Blair Mesina NP  [name], MD  Patient Care Team: Estill Dooms, MD as PCP - General (Internal Medicine) [name], MD (Ophthalmology) [name], MD (Psychiatry) Kendallyn Lippold Otho Darner, NP as Nurse Practitioner (Nurse Practitioner)  Extended Emergency Contact Information Primary Emergency Contact: [name], [place] Work Phone: [phone] Relation: Son Secondary Emergency Contact: [name] [place] Phone: [phone] Relation: Sister  Code Status:  DNR Goals of care: Advanced Directive information Advanced Directives 03/13/2016  Does patient have an advance directive? Yes  Type of Par[employer] of [place];Living will;Out of facility DNR (pink MOST or yellow form)  Does patient want to make changes to advanced directive? No - Patient declined  Copy of advanced directive(s) in chart? Yes  Pre-existing out of facility DNR order (yellow form or pink MOST form) -     Chief Complaint  Patient presents with  . Acute Visit    pain Rt leg x 5 days ago    HPI:  Pt is a 75 y.o. female seen today for medical management of chronic diseases.  Hx of constipation, has been stable, f/u GI,  Hx of GERD, stable while on Famotidine and Omeprazole, anxiety, depression, mood has been stable while on Amitriptyline 50mg , Librium 25mg , Lorazepam bid, muscle and joints aches pains, pain is managed with Norco bid and Flexeril prn HTN, controlled,  chronic ankle edema is minimal while taking Furosemide 20mg .    C/o the right knee pain for 4-5 days, able to ambulate, pain worsens with weight bearing and ROM, denied injury, onset is gradual, no focal weakness or swelling or deformity.   Past Medical History:  Diagnosis Date  . Allergy   . Anemia     . Anxiety   . Asthma   . CAD (coronary artery disease)   . Depression   . Diverticulosis   . Dysfunction of eustachian tube   . Elevated LFTs   . Esophageal reflux   . Esophageal stricture 2002  . Hemorrhoids   . History of rectal polyps   . HLD (hyperlipidemia)   . HTN (hypertension)   . Hypertonicity of bladder   . Irritable bowel syndrome with constipation   . Obesity   . Optic neuritis   . Osteoporosis   . Peptic ulcer disease   . Peripheral neuropathy ([place])   . Trigeminal neuralgia    prior injury  . 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 Vitamin D deficiency    Past Surgical History:  Procedure Laterality Date  . ABDOMINAL HYSTERECTOMY    . ANAL RECTAL MANOMETRY N/A 07/05/2014   Procedure: ANO RECTAL MANOMETRY;  Surgeon: Leighton Ruff, MD;  Location: [facility];  Service: Endoscopy;  Laterality: N/A;  . APPENDECTOMY    . CAROTID ENDARTERECTOMY    . COLONOSCOPY  2008, 2015  . TONSILLECTOMY    . UPPER GASTROINTESTINAL ENDOSCOPY  2002    Allergies  Allergen Reactions  . Azithromycin   . Biaxin [Clarithromycin]   . Clarithromycin   . Doxycycline   . Fosamax [Alendronate Sodium]   . Geodon [Ziprasidone Hydrochloride]   . Lipitor [Atorvastatin Calcium]   . Penicillins   .  Pravachol [Pravastatin Sodium]   . Statins   . Sulfonamide Derivatives   . Zocor [Simvastatin]       Medication List       Accurate as of 03/13/16 11:59 PM. Always use your most recent med list.          acetaminophen 325 MG tablet Commonly known as:  TYLENOL Take 650 mg by mouth every 6 (six) hours as needed. And take 2 tabs by mouth at bedtime   amitriptyline 50 MG tablet Commonly known as:  ELAVIL Take 50 mg by mouth at bedtime.   aspirin 81 MG EC tablet Take 81 mg by mouth daily.   bisacodyl 10 MG suppository Commonly known as:  DULCOLAX Place 10 mg rectally. Insert 1 supp rectally every day as needed for  constipation   bisacodyl 5 MG EC tablet Generic drug:  bisacodyl Take 5 mg by mouth. Take one tablet by mouth every day as needed for constipation   calcium-vitamin D 250-125 MG-UNIT tablet Commonly known as:  OSCAL WITH D Take 1 tablet by mouth 2 (two) times daily. Reported on 12/27/2015   CENTRUM SILVER PO Take 1 tablet by mouth.   chlordiazePOXIDE 25 MG capsule Commonly known as:  LIBRIUM Take one capsule by mouth at bedtime for anxiety   cyclobenzaprine 5 MG tablet Commonly known as:  FLEXERIL Take 5 mg by mouth 3 (three) times daily as needed for muscle spasms.   famotidine 20 MG tablet Commonly known as:  PEPCID Take 20 mg by mouth at bedtime.   fluPHENAZine 1 MG tablet Commonly known as:  PROLIXIN Take 1 mg by mouth at bedtime.   fluticasone 50 MCG/ACT nasal spray Commonly known as:  FLONASE Place 1 spray into both nostrils 2 (two) times daily.   furosemide 20 MG tablet Commonly known as:  LASIX 1 by mouth every am diuretic for peripheral edema   HYDROcodone-acetaminophen 5-325 MG tablet Commonly known as:  NORCO/VICODIN Take one tablet by mouth twice daily for pain   LINZESS 290 MCG Caps capsule Generic drug:  linaclotide Take 290 mcg by mouth daily.   LORazepam 0.5 MG tablet Commonly known as:  ATIVAN Take one tablet by mouth twice daily for anxiety   magnesium hydroxide 400 MG/5ML suspension Commonly known as:  MILK OF MAGNESIA Take by mouth. Take 30 ml by mouth every day as needed for constipation Take with prune juice at bedtime   MYLANTA [id number] MG/5ML suspension Generic drug:  alum & mag hydroxide-simeth Take by mouth. Take 27ml every 4 hours as needed and in the evening at 9pm for indigestion   omeprazole 20 MG capsule Commonly known as:  PRILOSEC Take 20 mg by mouth daily.   polyvinyl alcohol 1.4 % ophthalmic solution Commonly known as:  LIQUIFILM TEARS Place 1 drop into both eyes 3 (three) times daily.       Review of Systems    Constitutional: Negative for chills, diaphoresis and fever.  HENT: Positive for hearing loss and tinnitus. Negative for congestion, ear discharge, ear pain and nosebleeds.   Eyes: Negative for photophobia, pain and discharge.       Legally blind.   Respiratory: Positive for cough. Negative for shortness of breath and wheezing.   Cardiovascular: Negative for chest pain, palpitations and leg swelling.  Gastrointestinal: Positive for constipation. Negative for abdominal pain, diarrhea, nausea and vomiting.       C/o abd pain on and off. Not upon today's visit.   Genitourinary: Positive for frequency (adult  depends). Negative for dysuria, flank pain and urgency.       Pressure in her bladder area.   Musculoskeletal: Negative for back pain, myalgias and neck pain.       C/o right knee pain for 4-5 days, still able to ambulate with walker as usual.   Skin: Negative for rash.  Neurological: Positive for tremors (diffused rigidity. ). Negative for dizziness, seizures, weakness (generalized) and headaches.       Lips smacking  Psychiatric/Behavioral: Negative for suicidal ideas. The patient is nervous/anxious.        Hx of delusion, well controlled. Stated she wakes up a few times at night, but no difficulty return to sleep.     Immunization History  Administered Date(s) Administered  . Influenza Split 07/23/2013  . Influenza Whole 07/05/2009  . Influenza-Unspecified 07/29/2014, 06/14/2015  . PPD Test 06/17/2013  . Pneumococcal Polysaccharide-23 05/05/2009  . Td 05/05/2009  . Zoster 01/09/2011   Pertinent  Health Maintenance Due  Topic Date Due  . DEXA SCAN  07/12/2004  . PNA vac Low Risk Adult (2 of 2 - PCV13) 05/05/2010  . INFLUENZA VACCINE  04/24/2016   Fall Risk  07/29/2014  Falls in the past year? No   Functional Status Survey:    Vitals:   03/13/16 1504  BP: (!) 128/56  Pulse: 64  Resp: 16  Temp: (!) 96.7 F (35.9 C)  TempSrc: Oral  Weight: 149 lb 9.6 oz (67.9 kg)   Height: 5\' 1"  (1.549 m)   Body mass index is 28.27 kg/m. Physical Exam  Constitutional: She is oriented to person, place, and time. She appears well-developed and well-nourished. No distress.  HENT:  Head: Normocephalic and atraumatic.  Right Ear: External ear normal. No lacerations. No drainage, swelling or tenderness. No foreign bodies. No mastoid tenderness. Tympanic membrane is not injected, not scarred, not perforated, not erythematous, not retracted and not bulging. No middle ear effusion. No hemotympanum. Decreased hearing is noted.  Left Ear: External ear normal. No lacerations. No drainage, swelling or tenderness. No foreign bodies. No mastoid tenderness. Tympanic membrane is not injected, not scarred, not perforated, not erythematous, not retracted and not bulging.  No middle ear effusion. No hemotympanum. Decreased hearing is noted.  Eyes: Conjunctivae and EOM are normal. Pupils are equal, round, and reactive to light.  Neck: Neck supple.  Cardiovascular: Normal rate, regular rhythm and normal heart sounds.   Pulmonary/Chest: Effort normal and breath sounds normal. No respiratory distress. She has no rales. She exhibits no tenderness.  Abdominal: Soft. Bowel sounds are normal. She exhibits no distension. There is no tenderness. There is no rebound and no guarding.  External hemorrhoidsx2-not inflamed from previous exam.   Genitourinary: Rectal exam shows guaiac negative stool.  Genitourinary Comments: No hard stools in her rectal vault.   Musculoskeletal: She exhibits no edema (trace) or tenderness.       Left shoulder: She exhibits decreased range of motion, bony tenderness, swelling and pain. She exhibits no effusion, no crepitus, no deformity, no laceration, no spasm, normal pulse and normal strength.  C/o right knee pain with walking. No focal swelling, warmth, or deformity.   Neurological: She is alert and oriented to person, place, and time. She has normal reflexes. No  cranial nerve deficit.  involuntary lip movements.   Skin: Skin is warm and dry.  Psychiatric: Her mood appears not anxious. Her affect is not angry, not blunt, not labile and not inappropriate. Her speech is not rapid and/or pressured, not  delayed, not tangential and not slurred. She is slowed. She is not agitated, not hyperactive, not withdrawn, not actively hallucinating and not combative. Thought content is not paranoid and not delusional. Cognition and memory are impaired. She does not express impulsivity or inappropriate judgment. She exhibits a depressed mood. She expresses no homicidal and no suicidal ideation. She expresses no suicidal plans and no homicidal plans. She is communicative. She exhibits abnormal recent memory.  A little anxious/flat affect/slow in responding, no frank hallucinosis.  She is attentive.    Labs reviewed:  Recent Labs  07/02/15 02/14/16  NA 138 139  K 4.4 4.3  BUN 10 13  CREATININE 0.8 1.0    Recent Labs  07/02/15 02/14/16  AST 28 24  ALT 19 17  ALKPHOS 123 117    Recent Labs  07/02/15 02/14/16  WBC 6.8 6.9  HGB 12.3 12.1  HCT 38 36  PLT 369 320   Lab Results  Component Value Date   TSH 1.36 02/14/2016   Lab Results  Component Value Date   HGBA1C 6.0 02/14/2016   Lab Results  Component Value Date   CHOL 155 04/04/2013   HDL 53 04/04/2013   LDLCALC 80 04/04/2013   LDLDIRECT 190.0 03/24/2012   TRIG 108 04/04/2013   CHOLHDL 4 03/24/2012    Significant Diagnostic Results in last 30 days:  No results found.  Assessment/Plan  Right knee pain Activity as tolerated, X-ray AP and lateral views to evaluate further, prn Tylenol available to her.   Essential hypertension Blood pressure is controlled, continue Furosemide 20mg , last Bun 10, creat 0.82 07/02/15  Constipation 03/18/14 Dr: Carlean Purl Colonoscopy: severe diverticulosis was noted in the sigmoid colon, mild diverticulosis was noted in the right colon, the colon mucosa was  otherwise normal.   If current regimen is not adequately treating constipation then she needs to return to see me for more thorough rectal evaluation(?defecation dysfunction).   Seeing GI regularly.   03/24/15 GI recommended to reduce Norco  1/301/7 stable, continue Linzess 251mcg daily, prn MOM,  Dulcolax PR/PO  12/01/15 still c/o constipation, last BM this am. F/u GI  Chronic condition, has been managed by GI   GERD (gastroesophageal reflux disease) Stable, continue Famotidine 20mg , Omeprazole 20mg ,  prn Mintox 73ml q4h prn.     SKIN RASH Hydrocortisone cream bid to affected area until healed.   Hypertonicity of bladder Adult depends for urine leakage   Family/ staff Communication: continue [facility] for care needs.   Labs/tests ordered: X-ray AP and lateral views of the right knee. Dictation #1 [id number]  [id number]

## 2016-04-15 NOTE — Assessment & Plan Note (Signed)
Hydrocortisone cream bid to affected area until healed.

## 2016-04-15 NOTE — Assessment & Plan Note (Signed)
Stable, continue Famotidine 20mg , Omeprazole 20mg ,  prn Mintox 74ml q4h prn.

## 2016-04-15 NOTE — Assessment & Plan Note (Signed)
Activity as tolerated, X-ray AP and lateral views to evaluate further, prn Tylenol available to her.

## 2016-04-15 NOTE — Assessment & Plan Note (Signed)
Adult depends for urine leakage 

## 2016-05-02 ENCOUNTER — Encounter: Payer: Self-pay | Admitting: Nurse Practitioner

## 2016-05-02 ENCOUNTER — Non-Acute Institutional Stay: Payer: Medicare Other | Admitting: Nurse Practitioner

## 2016-05-02 DIAGNOSIS — R609 Edema, unspecified: Secondary | ICD-10-CM

## 2016-05-02 DIAGNOSIS — K5900 Constipation, unspecified: Secondary | ICD-10-CM | POA: Diagnosis not present

## 2016-05-02 DIAGNOSIS — D638 Anemia in other chronic diseases classified elsewhere: Secondary | ICD-10-CM | POA: Diagnosis not present

## 2016-05-02 DIAGNOSIS — K219 Gastro-esophageal reflux disease without esophagitis: Secondary | ICD-10-CM

## 2016-05-02 DIAGNOSIS — I10 Essential (primary) hypertension: Secondary | ICD-10-CM

## 2016-05-02 DIAGNOSIS — Z8739 Personal history of other diseases of the musculoskeletal system and connective tissue: Secondary | ICD-10-CM | POA: Diagnosis not present

## 2016-05-02 DIAGNOSIS — M25561 Pain in right knee: Secondary | ICD-10-CM | POA: Diagnosis not present

## 2016-05-02 DIAGNOSIS — K581 Irritable bowel syndrome with constipation: Secondary | ICD-10-CM

## 2016-05-02 DIAGNOSIS — F418 Other specified anxiety disorders: Secondary | ICD-10-CM | POA: Diagnosis not present

## 2016-05-02 DIAGNOSIS — N318 Other neuromuscular dysfunction of bladder: Secondary | ICD-10-CM

## 2016-05-02 DIAGNOSIS — K5909 Other constipation: Secondary | ICD-10-CM

## 2016-05-02 NOTE — Assessment & Plan Note (Signed)
Adult depends for urine leakage 

## 2016-05-02 NOTE — Assessment & Plan Note (Addendum)
Chronic, continue to manage pain, ambulate with walker. 03/19/16 X-ray R knee no definite radiographic evidence of acute fx, no joint effusion.  MRI 05/09/16, s/p inj, f/u Ortho, lateral right knee pain, worsen with walking with lifting up

## 2016-05-02 NOTE — Assessment & Plan Note (Signed)
Baseline Hgb 11-12

## 2016-05-02 NOTE — Assessment & Plan Note (Addendum)
Blood pressure is controlled, continue Furosemide 20mg ,  02/14/16 Na 139, K 4.3, Bun 13, creat 0.96

## 2016-05-02 NOTE — Assessment & Plan Note (Addendum)
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  Psych hospitalization x2 in the past.  02/14/16 TSH 1.37

## 2016-05-02 NOTE — Assessment & Plan Note (Addendum)
Only trace edema seen in ankles, continue Furosemide 20mg  daily. 02/14/16 Na 139, K 4.3, Bun 13, creat 0.96

## 2016-05-02 NOTE — Assessment & Plan Note (Signed)
Stable, continue Famotidine 20mg , Omeprazole 20mg ,  prn Mintox 66ml q4h prn.

## 2016-05-02 NOTE — Progress Notes (Signed)
Location:   [place] Room Number: [id number] Place of Service:  SNF (31) Provider:  Marlana Latus  NP   Patient Care Team: Estill Dooms, MD as PCP - General (Internal Medicine) [name], MD (Ophthalmology) [name], MD (Psychiatry) Juvenal Umar Otho Darner, NP as Nurse Practitioner (Nurse Practitioner)c  Extended Emergency Contact Information Primary Emergency Contact: [name], [place] Work Phone: [phone] Relation: Son Secondary Emergency Contact: [name] [place] Phone: [phone] Relation: Sister  Code Status:  DNR Goals of care: Advanced Directive information Advanced Directives 05/02/2016  Does patient have an advance directive? Yes  Type of Paramedic of [place];Living will;Out of facility DNR (pink MOST or yellow form)  Does patient want to make changes to advanced directive? No - Patient declined  Copy of advanced directive(s) in chart? Yes  Pre-existing out of facility DNR order (yellow form or pink MOST form) -     Chief Complaint  Patient presents with  . Medical Management of Chronic Issues    HPI:  Pt is a 75 y.o. female seen today for medical management of chronic diseases.     Hx of constipation, has been stable, f/u GI,  Hx of GERD, stable while on Famotidine and Omeprazole, anxiety, depression, mood has been stable while on Amitriptyline 50mg , Librium 25mg , Lorazepam bid, muscle and joints aches pains, R knee pain,  pain is managed with Norco bid, Tylenol, Flexeril prn HTN, controlled,  chronic ankle edema is minimal while taking Furosemide 20mg    Past Medical History:  Diagnosis Date  . Allergy   . Anemia   . Anxiety   . Asthma   . CAD (coronary artery disease)   . Depression   . Diverticulosis   . Dysfunction of eustachian tube   . Elevated LFTs   . Esophageal reflux   . Esophageal stricture 2002  . Hemorrhoids   . History  of rectal polyps   . HLD (hyperlipidemia)   . HTN (hypertension)   . Hypertonicity of bladder   . Irritable bowel syndrome with constipation   . Obesity   . Optic neuritis   . Osteoporosis   . Peptic ulcer disease   . Peripheral neuropathy ([place])   . Trigeminal neuralgia    prior injury  . 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 Vitamin D deficiency    Past Surgical History:  Procedure Laterality Date  . ABDOMINAL HYSTERECTOMY    . ANAL RECTAL MANOMETRY N/A 07/05/2014   Procedure: ANO RECTAL MANOMETRY;  Surgeon: Leighton Ruff, MD;  Location: [facility];  Service: Endoscopy;  Laterality: N/A;  . APPENDECTOMY    . CAROTID ENDARTERECTOMY    . COLONOSCOPY  2008, 2015  . TONSILLECTOMY    . UPPER GASTROINTESTINAL ENDOSCOPY  2002    Allergies  Allergen Reactions  . Azithromycin   . Biaxin [Clarithromycin]   . Clarithromycin   . Doxycycline   . Fosamax [Alendronate Sodium]   . Geodon [Ziprasidone Hydrochloride]   . Lipitor [Atorvastatin Calcium]   . Penicillins   . Pravachol [Pravastatin Sodium]   . Statins   . Sulfonamide Derivatives   . Zocor [Simvastatin]       Medication List       Accurate as of 05/02/16 11:59 PM. Always use your most recent med list.  acetaminophen 325 MG tablet Commonly known as:  TYLENOL Take 650 mg by mouth every 6 (six) hours as needed. And take 2 tabs by mouth at bedtime   amitriptyline 50 MG tablet Commonly known as:  ELAVIL Take 50 mg by mouth at bedtime.   aspirin 81 MG EC tablet Take 81 mg by mouth daily.   bisacodyl 10 MG suppository Commonly known as:  DULCOLAX Place 10 mg rectally. Insert 1 supp rectally every day as needed for constipation   bisacodyl 5 MG EC tablet Generic drug:  bisacodyl Take 5 mg by mouth. Take one tablet by mouth every day as needed for constipation   calcium-vitamin D 250-125 MG-UNIT tablet Commonly known as:  OSCAL WITH D Take  1 tablet by mouth 2 (two) times daily. Reported on 12/27/2015   CENTRUM SILVER PO Take 1 tablet by mouth.   chlordiazePOXIDE 25 MG capsule Commonly known as:  LIBRIUM Take one capsule by mouth at bedtime for anxiety   cyclobenzaprine 5 MG tablet Commonly known as:  FLEXERIL Take 5 mg by mouth 3 (three) times daily as needed for muscle spasms.   famotidine 20 MG tablet Commonly known as:  PEPCID Take 20 mg by mouth at bedtime.   fluPHENAZine 1 MG tablet Commonly known as:  PROLIXIN Take 1 mg by mouth at bedtime.   fluticasone 50 MCG/ACT nasal spray Commonly known as:  FLONASE Place 1 spray into both nostrils 2 (two) times daily.   furosemide 20 MG tablet Commonly known as:  LASIX 1 by mouth every am diuretic for peripheral edema   HYDROcodone-acetaminophen 5-325 MG tablet Commonly known as:  NORCO/VICODIN Take one tablet by mouth twice daily for pain   LINZESS 290 MCG Caps capsule Generic drug:  linaclotide Take 290 mcg by mouth daily.   LORazepam 0.5 MG tablet Commonly known as:  ATIVAN Take one tablet by mouth twice daily for anxiety   magnesium hydroxide 400 MG/5ML suspension Commonly known as:  MILK OF MAGNESIA Take by mouth. Take 30 ml by mouth every day as needed for constipation Take with prune juice at bedtime   MYLANTA [id number] MG/5ML suspension Generic drug:  alum & mag hydroxide-simeth Take by mouth. Take 82ml every 4 hours as needed and in the evening at 9pm for indigestion   omeprazole 20 MG capsule Commonly known as:  PRILOSEC Take 20 mg by mouth daily.   polyvinyl alcohol 1.4 % ophthalmic solution Commonly known as:  LIQUIFILM TEARS Place 1 drop into both eyes 3 (three) times daily.       Review of Systems  Constitutional: Negative for chills, diaphoresis and fever.  HENT: Positive for hearing loss and tinnitus. Negative for congestion, ear discharge, ear pain and nosebleeds.   Eyes: Negative for photophobia, pain and discharge.        Legally blind.   Respiratory: Positive for cough. Negative for shortness of breath and wheezing.   Cardiovascular: Negative for chest pain, palpitations and leg swelling.  Gastrointestinal: Positive for constipation. Negative for abdominal pain, diarrhea, nausea and vomiting.       C/o abd pain on and off. Not upon today's visit.   Genitourinary: Positive for frequency (adult depends). Negative for dysuria, flank pain and urgency.       Pressure in her bladder area.   Musculoskeletal: Negative for back pain, myalgias and neck pain.       C/o right knee pain for 4-5 days, still able to ambulate with walker as usual.   Skin:  Negative for rash.  Neurological: Positive for tremors (diffused rigidity. ). Negative for dizziness, seizures, weakness (generalized) and headaches.       Lips smacking  Psychiatric/Behavioral: Negative for suicidal ideas. The patient is nervous/anxious.        Hx of delusion, well controlled. Stated she wakes up a few times at night, but no difficulty return to sleep.     Immunization History  Administered Date(s) Administered  . Influenza Split 07/23/2013  . Influenza Whole 07/05/2009  . Influenza-Unspecified 07/29/2014, 06/14/2015  . PPD Test 06/17/2013  . Pneumococcal Polysaccharide-23 05/05/2009  . Td 05/05/2009  . Zoster 01/09/2011   Pertinent  Health Maintenance Due  Topic Date Due  . DEXA SCAN  07/12/2004  . PNA vac Low Risk Adult (2 of 2 - PCV13) 05/05/2010  . INFLUENZA VACCINE  04/24/2016   Fall Risk  07/29/2014  Falls in the past year? No   Functional Status Survey:    Vitals:   05/02/16 1238  BP: 135/80  Pulse: 80  Resp: 18  Temp: (!) 96.9 F (36.1 C)  Weight: 150 lb 3.2 oz (68.1 kg)  Height: 5\' 1"  (1.549 m)   Body mass index is 28.38 kg/m. Physical Exam  Constitutional: She is oriented to person, place, and time. She appears well-developed and well-nourished. No distress.  HENT:  Head: Normocephalic and atraumatic.  Right Ear:  External ear normal. No lacerations. No drainage, swelling or tenderness. No foreign bodies. No mastoid tenderness. Tympanic membrane is not injected, not scarred, not perforated, not erythematous, not retracted and not bulging. No middle ear effusion. No hemotympanum. Decreased hearing is noted.  Left Ear: External ear normal. No lacerations. No drainage, swelling or tenderness. No foreign bodies. No mastoid tenderness. Tympanic membrane is not injected, not scarred, not perforated, not erythematous, not retracted and not bulging.  No middle ear effusion. No hemotympanum. Decreased hearing is noted.  Eyes: Conjunctivae and EOM are normal. Pupils are equal, round, and reactive to light.  Neck: Neck supple.  Cardiovascular: Normal rate, regular rhythm and normal heart sounds.   Pulmonary/Chest: Effort normal and breath sounds normal. No respiratory distress. She has no rales. She exhibits no tenderness.  Abdominal: Soft. Bowel sounds are normal. She exhibits no distension. There is no tenderness. There is no rebound and no guarding.  External hemorrhoidsx2-not inflamed from previous exam.   Genitourinary: Rectal exam shows guaiac negative stool.  Genitourinary Comments: No hard stools in her rectal vault.   Musculoskeletal: She exhibits no edema (trace) or tenderness.       Left shoulder: She exhibits decreased range of motion, bony tenderness, swelling and pain. She exhibits no effusion, no crepitus, no deformity, no laceration, no spasm, normal pulse and normal strength.  C/o right knee pain with walking. No focal swelling, warmth, or deformity.   Neurological: She is alert and oriented to person, place, and time. She has normal reflexes. No cranial nerve deficit.  involuntary lip movements.   Skin: Skin is warm and dry.  Psychiatric: Her mood appears not anxious. Her affect is not angry, not blunt, not labile and not inappropriate. Her speech is not rapid and/or pressured, not delayed, not  tangential and not slurred. She is slowed. She is not agitated, not hyperactive, not withdrawn, not actively hallucinating and not combative. Thought content is not paranoid and not delusional. Cognition and memory are impaired. She does not express impulsivity or inappropriate judgment. She exhibits a depressed mood. She expresses no homicidal and no suicidal ideation. She  expresses no suicidal plans and no homicidal plans. She is communicative. She exhibits abnormal recent memory.  A little anxious/flat affect/slow in responding, no frank hallucinosis.  She is attentive.    Labs reviewed:  Recent Labs  07/02/15 02/14/16  NA 138 139  K 4.4 4.3  BUN 10 13  CREATININE 0.8 1.0    Recent Labs  07/02/15 02/14/16  AST 28 24  ALT 19 17  ALKPHOS 123 117    Recent Labs  07/02/15 02/14/16  WBC 6.8 6.9  HGB 12.3 12.1  HCT 38 36  PLT 369 320   Lab Results  Component Value Date   TSH 1.36 02/14/2016   Lab Results  Component Value Date   HGBA1C 6.0 02/14/2016   Lab Results  Component Value Date   CHOL 155 04/04/2013   HDL 53 04/04/2013   LDLCALC 80 04/04/2013   LDLDIRECT 190.0 03/24/2012   TRIG 108 04/04/2013   CHOLHDL 4 03/24/2012    Significant Diagnostic Results in last 30 days:  No results found.  Assessment/Plan There are no diagnoses linked to this encounter.Essential hypertension Blood pressure is controlled, continue Furosemide 20mg ,  02/14/16 Na 139, K 4.3, Bun 13, creat 0.96    Irritable bowel syndrome 03/18/14 Dr: Carlean Purl Colonoscopy: severe diverticulosis was noted in the sigmoid colon, mild diverticulosis was noted in the right colon, the colon mucosa was otherwise normal.   If current regimen is not adequately treating constipation then she needs to return to see me for more thorough rectal evaluation(?defecation dysfunction).   Seeing GI regularly.   03/24/15 GI recommended to reduce Norco  1/301/7 stable, continue Linzess 235mcg daily, prn MOM,   Dulcolax PR/PO  12/01/15 still c/o constipation, last BM this am. F/u GI  Chronic condition, has been managed by GI    GERD (gastroesophageal reflux disease) Stable, continue Famotidine 20mg , Omeprazole 20mg ,  prn Mintox 29ml q4h prn.   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  Psych hospitalization x2 in the past.  02/14/16 TSH 1.37    History of muscle pain Occasionally c/o pain allover, takes Tylenol 650mg  nightly, prn Flexeril 5mg  tid, Norco 325/5 mg bid.     PERIPHERAL EDEMA Only trace edema seen in ankles, continue Furosemide 20mg  daily. 02/14/16 Na 139, K 4.3, Bun 13, creat 0.96   Anemia of chronic disease Baseline Hgb 11-12  Right knee pain Chronic, continue to manage pain, ambulate with walker. 03/19/16 X-ray R knee no definite radiographic evidence of acute fx, no joint effusion.  MRI 05/09/16, s/p inj, f/u Ortho, lateral right knee pain, worsen with walking with lifting up   Constipation 03/18/14 Dr: Carlean Purl Colonoscopy: severe diverticulosis was noted in the sigmoid colon, mild diverticulosis was noted in the right colon, the colon mucosa was otherwise normal.   If current regimen is not adequately treating constipation then she needs to return to see me for more thorough rectal evaluation(?defecation dysfunction).   Seeing GI regularly.   03/24/15 GI recommended to reduce Norco  1/301/7 stable, continue Linzess 263mcg daily, prn MOM,  Dulcolax PR/PO  12/01/15 still c/o constipation, last BM this am. F/u GI  Chronic condition, has been managed by GI       Family/ staff Communication: continue AL for care assistance  Labs/tests ordered: none

## 2016-05-02 NOTE — Assessment & Plan Note (Signed)
03/18/14 Dr: Carlean Purl Colonoscopy: severe diverticulosis was noted in the sigmoid colon, mild diverticulosis was noted in the right colon, the colon mucosa was otherwise normal.   If current regimen is not adequately treating constipation then she needs to return to see me for more thorough rectal evaluation(?defecation dysfunction).   Seeing GI regularly.   03/24/15 GI recommended to reduce Norco  1/301/7 stable, continue Linzess 262mcg daily, prn MOM,  Dulcolax PR/PO  12/01/15 still c/o constipation, last BM this am. F/u GI  Chronic condition, has been managed by GI

## 2016-05-02 NOTE — Assessment & Plan Note (Signed)
Occasionally c/o pain allover, takes Tylenol 650mg  nightly, prn Flexeril 5mg  tid, Norco 325/5 mg bid.

## 2016-05-03 NOTE — Assessment & Plan Note (Signed)
03/18/14 Dr: Carlean Purl Colonoscopy: severe diverticulosis was noted in the sigmoid colon, mild diverticulosis was noted in the right colon, the colon mucosa was otherwise normal.   If current regimen is not adequately treating constipation then she needs to return to see me for more thorough rectal evaluation(?defecation dysfunction).   Seeing GI regularly.   03/24/15 GI recommended to reduce Norco  1/301/7 stable, continue Linzess 237mcg daily, prn MOM,  Dulcolax PR/PO  12/01/15 still c/o constipation, last BM this am. F/u GI  Chronic condition, has been managed by GI

## 2016-06-01 ENCOUNTER — Non-Acute Institutional Stay: Payer: Medicare Other | Admitting: Internal Medicine

## 2016-06-01 ENCOUNTER — Encounter: Payer: Self-pay | Admitting: Internal Medicine

## 2016-06-01 DIAGNOSIS — M25561 Pain in right knee: Secondary | ICD-10-CM

## 2016-06-01 DIAGNOSIS — I10 Essential (primary) hypertension: Secondary | ICD-10-CM | POA: Diagnosis not present

## 2016-06-01 DIAGNOSIS — H6123 Impacted cerumen, bilateral: Secondary | ICD-10-CM | POA: Diagnosis not present

## 2016-06-01 DIAGNOSIS — H6120 Impacted cerumen, unspecified ear: Secondary | ICD-10-CM | POA: Insufficient documentation

## 2016-06-01 NOTE — Progress Notes (Addendum)
Progress Note    Location:   [facility] Room Number: [place] of Service:  [facility] [phone]) Provider:  [name]  MD  [name], MD  Patient Care Team: Estill Dooms, MD as PCP - General (Internal Medicine) [name], MD (Ophthalmology) [name], MD (Psychiatry) Man Otho Darner, NP as Nurse Practitioner (Nurse Practitioner)  Extended Emergency Contact Information Primary Emergency Contact: [name], [place] Work Phone: [phone] Relation: Son Secondary Emergency Contact: [name] [place] of [place] Phone: [phone] Relation: Sister  Code Status:  DNR Goals of care: Advanced Directive information Advanced Directives 06/01/2016  Does patient have an advance directive? Yes  Type of Paramedic of [place];Living will;Out of facility DNR (pink MOST or yellow form)  Does patient want to make changes to advanced directive? No - Patient declined  Copy of advanced directive(s) in chart? Yes  Pre-existing out of facility DNR order (yellow form or pink MOST form) -     Chief Complaint  Patient presents with  . Acute Visit    Pain in mouth,radiating back to ear     HPI:  Pt is a 75 y.o. female seen today for an acute visit for Evaluation of discomfort in the left ear and along the left jawline. Patient is afebrile. She denies difficulty swallowing or pharyngitis. And no excessive nasal drainage.   Past Medical History:  Diagnosis Date  . Allergy   . Anemia   . Anxiety   . Asthma   . CAD (coronary artery disease)   . Depression   . Diverticulosis   . Dysfunction of eustachian tube   . Elevated LFTs   . Esophageal reflux   . Esophageal stricture 2002  . Hemorrhoids   . History of rectal polyps   . HLD (hyperlipidemia)   . HTN (hypertension)   . Hypertonicity of bladder   . Irritable bowel syndrome with constipation   . Obesity   . Optic  neuritis   . Osteoporosis   . Peptic ulcer disease   . Peripheral neuropathy ([place])   . Trigeminal neuralgia    prior injury  . 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 Vitamin D deficiency    Past Surgical History:  Procedure Laterality Date  . ABDOMINAL HYSTERECTOMY    . ANAL RECTAL MANOMETRY N/A 07/05/2014   Procedure: ANO RECTAL MANOMETRY;  Surgeon: Leighton Ruff, MD;  Location: [facility];  Service: Endoscopy;  Laterality: N/A;  . APPENDECTOMY    . CAROTID ENDARTERECTOMY    . COLONOSCOPY  2008, 2015  . TONSILLECTOMY    . UPPER GASTROINTESTINAL ENDOSCOPY  2002    Allergies  Allergen Reactions  . Azithromycin   . Biaxin [Clarithromycin]   . Clarithromycin   . Doxycycline   . Fosamax [Alendronate Sodium]   . Geodon [Ziprasidone Hydrochloride]   . Lipitor [Atorvastatin Calcium]   . Penicillins   . Pravachol [Pravastatin Sodium]   . Statins   . Sulfonamide Derivatives   . Zocor [Simvastatin]       Medication List       Accurate as of 06/01/16 10:55 AM. Always use your most recent med list.          acetaminophen 325 MG tablet Commonly known as:  TYLENOL Take 650 mg by mouth every 6 (six) hours  as needed. And take 2 tabs by mouth at bedtime   amitriptyline 50 MG tablet Commonly known as:  ELAVIL Take 50 mg by mouth at bedtime.   aspirin 81 MG EC tablet Take 81 mg by mouth daily.   bisacodyl 10 MG suppository Commonly known as:  DULCOLAX Place 10 mg rectally. Insert 1 supp rectally every day as needed for constipation   bisacodyl 5 MG EC tablet Generic drug:  bisacodyl Take 5 mg by mouth. Take one tablet by mouth every day as needed for constipation   calcium-vitamin D 250-125 MG-UNIT tablet Commonly known as:  OSCAL WITH D Take 1 tablet by mouth 2 (two) times daily. Reported on 12/27/2015   CENTRUM SILVER PO Take 1 tablet by mouth.   chlordiazePOXIDE 25 MG capsule Commonly known as:   LIBRIUM Take one capsule by mouth at bedtime for anxiety   cyclobenzaprine 5 MG tablet Commonly known as:  FLEXERIL Take 5 mg by mouth 3 (three) times daily as needed for muscle spasms.   famotidine 20 MG tablet Commonly known as:  PEPCID Take 20 mg by mouth at bedtime.   fluPHENAZine 1 MG tablet Commonly known as:  PROLIXIN Take 1 mg by mouth at bedtime.   fluticasone 50 MCG/ACT nasal spray Commonly known as:  FLONASE Place 1 spray into both nostrils 2 (two) times daily.   furosemide 20 MG tablet Commonly known as:  LASIX 1 by mouth every am diuretic for peripheral edema   HYDROcodone-acetaminophen 5-325 MG tablet Commonly known as:  NORCO/VICODIN Take one tablet by mouth twice daily for pain   LINZESS 290 MCG Caps capsule Generic drug:  linaclotide Take 290 mcg by mouth daily.   LORazepam 0.5 MG tablet Commonly known as:  ATIVAN Take one tablet by mouth twice daily for anxiety   magnesium hydroxide 400 MG/5ML suspension Commonly known as:  MILK OF MAGNESIA Take by mouth. Take 30 ml by mouth every day as needed for constipation Take with prune juice at bedtime   MYLANTA [id number] MG/5ML suspension Generic drug:  alum & mag hydroxide-simeth Take by mouth. Take 5ml every 4 hours as needed and in the evening at 9pm for indigestion   omeprazole 20 MG capsule Commonly known as:  PRILOSEC Take 20 mg by mouth daily.   polyvinyl alcohol 1.4 % ophthalmic solution Commonly known as:  LIQUIFILM TEARS Place 1 drop into both eyes 3 (three) times daily.       Review of Systems  Constitutional: Negative for chills, diaphoresis and fever.  HENT: Positive for dental problem (Tender along the mandible, so she doesn't wear her dentures.), ear pain, hearing loss and tinnitus. Negative for congestion, ear discharge, facial swelling, nosebleeds, sinus pressure, sore throat, trouble swallowing and voice change.   Eyes: Negative for photophobia, pain and discharge.       Legally  blind.   Respiratory: Negative for cough, shortness of breath and wheezing.   Cardiovascular: Negative for chest pain, palpitations and leg swelling.  Gastrointestinal: Positive for constipation. Negative for abdominal pain, diarrhea, nausea and vomiting.       C/o abd pain on and off. Not upon today's visit.   Genitourinary: Positive for frequency (adult depends). Negative for dysuria, flank pain and urgency.       Pressure in her bladder area.   Musculoskeletal: Negative for back pain, myalgias and neck pain.       C/o right knee pain that, still able to ambulate with walker as usual.   Skin:  Negative for rash.  Neurological: Positive for tremors (diffused rigidity. ). Negative for dizziness, seizures, weakness (generalized) and headaches.       Lips smacking  Psychiatric/Behavioral: Negative for suicidal ideas. The patient is nervous/anxious.        Hx of delusion, well controlled. Stated she wakes up a few times at night, but no difficulty return to sleep.     Immunization History  Administered Date(s) Administered  . Influenza Split 07/23/2013  . Influenza Whole 07/05/2009  . Influenza-Unspecified 07/29/2014, 06/14/2015  . PPD Test 06/17/2013  . Pneumococcal Polysaccharide-23 05/05/2009  . Td 05/05/2009  . Zoster 01/09/2011   Pertinent  Health Maintenance Due  Topic Date Due  . DEXA SCAN  07/12/2004  . PNA vac Low Risk Adult (2 of 2 - PCV13) 05/05/2010  . INFLUENZA VACCINE  04/24/2016   Fall Risk  07/29/2014  Falls in the past year? No   Functional Status Survey:    Vitals:   06/01/16 1054  BP: 132/84  Pulse: 71  Resp: 18  Temp: (!) 96.5 F (35.8 C)  Weight: 150 lb 3.2 oz (68.1 kg)  Height: 5\' 1"  (1.549 m)   Body mass index is 28.38 kg/m. Physical Exam  Constitutional: She is oriented to person, place, and time. She appears well-developed and well-nourished. No distress.  HENT:  Head: Normocephalic and atraumatic.  Right Ear: No lacerations. No drainage,  swelling or tenderness. No foreign bodies. No mastoid tenderness. Tympanic membrane is not injected, not scarred, not perforated, not erythematous, not retracted and not bulging. No middle ear effusion. No hemotympanum. Decreased hearing is noted.  Left Ear: No lacerations. No drainage, swelling or tenderness. No foreign bodies. No mastoid tenderness. Tympanic membrane is not injected, not scarred, not perforated, not erythematous, not retracted and not bulging.  No middle ear effusion. No hemotympanum. Decreased hearing is noted.  Mild discomfort at thr left TMJ and along the left mandible. There may be a little swelling of the left parotid. Very hard wax occluding both EAC. Chronic hearing loss bilaterally.  Eyes: Conjunctivae and EOM are normal. Pupils are equal, round, and reactive to light.  Neck: Neck supple.  Cardiovascular: Normal rate, regular rhythm and normal heart sounds.   Pulmonary/Chest: Effort normal and breath sounds normal. No respiratory distress. She has no rales. She exhibits no tenderness.  Abdominal: Soft. Bowel sounds are normal. She exhibits no distension. There is no tenderness. There is no rebound and no guarding.  Hx of External hemorrhoidsx2-  Genitourinary: Rectal exam shows guaiac negative stool.  Musculoskeletal: She exhibits no edema or tenderness.       Left shoulder: She exhibits decreased range of motion, bony tenderness, swelling and pain. She exhibits no effusion, no crepitus, no deformity, no laceration, no spasm, normal pulse and normal strength.  C/o right knee pain with walking. No focal swelling, warmth, or deformity.   Neurological: She is alert and oriented to person, place, and time. She has normal reflexes. No cranial nerve deficit.  involuntary lip movements.   Skin: Skin is warm and dry.  Psychiatric: Her mood appears not anxious. Her affect is not angry, not blunt, not labile and not inappropriate. Her speech is not rapid and/or pressured, not  delayed, not tangential and not slurred. She is slowed. She is not agitated, not hyperactive, not withdrawn, not actively hallucinating and not combative. Thought content is not paranoid and not delusional. Cognition and memory are impaired. She does not express impulsivity or inappropriate judgment. She exhibits a  depressed mood. She expresses no homicidal and no suicidal ideation. She expresses no suicidal plans and no homicidal plans. She is communicative. She exhibits abnormal recent memory.  A little anxious/flat affect/slow in responding, no frank hallucinosis.  She is attentive.    Labs reviewed:  Recent Labs  07/02/15 02/14/16  NA 138 139  K 4.4 4.3  BUN 10 13  CREATININE 0.8 1.0    Recent Labs  07/02/15 02/14/16  AST 28 24  ALT 19 17  ALKPHOS 123 117    Recent Labs  07/02/15 02/14/16  WBC 6.8 6.9  HGB 12.3 12.1  HCT 38 36  PLT 369 320   Lab Results  Component Value Date   TSH 1.36 02/14/2016   Lab Results  Component Value Date   HGBA1C 6.0 02/14/2016   Lab Results  Component Value Date   CHOL 155 04/04/2013   HDL 53 04/04/2013   LDLCALC 80 04/04/2013   LDLDIRECT 190.0 03/24/2012   TRIG 108 04/04/2013   CHOLHDL 4 03/24/2012   Assessment/Plan 1. Cerumen impaction, bilateral Soften wax with olive oil Schedule appt in clinic for lavage in 2 weeks Because of the mild tenderness in the left parotid, I added Cleocin for 7 days.(Penicillin allergy noted)  2. Essential hypertension controlled  3. Right knee pain Continue too see Dr. Tonita Cong for injections

## 2016-06-28 ENCOUNTER — Non-Acute Institutional Stay: Payer: Medicare Other | Admitting: Nurse Practitioner

## 2016-06-28 DIAGNOSIS — F411 Generalized anxiety disorder: Secondary | ICD-10-CM

## 2016-06-28 DIAGNOSIS — N318 Other neuromuscular dysfunction of bladder: Secondary | ICD-10-CM

## 2016-06-28 DIAGNOSIS — M25561 Pain in right knee: Secondary | ICD-10-CM | POA: Diagnosis not present

## 2016-06-28 DIAGNOSIS — R609 Edema, unspecified: Secondary | ICD-10-CM

## 2016-06-28 DIAGNOSIS — K5909 Other constipation: Secondary | ICD-10-CM | POA: Diagnosis not present

## 2016-06-28 DIAGNOSIS — I10 Essential (primary) hypertension: Secondary | ICD-10-CM | POA: Diagnosis not present

## 2016-06-28 DIAGNOSIS — K219 Gastro-esophageal reflux disease without esophagitis: Secondary | ICD-10-CM

## 2016-06-28 DIAGNOSIS — H6120 Impacted cerumen, unspecified ear: Secondary | ICD-10-CM | POA: Diagnosis not present

## 2016-06-28 NOTE — Assessment & Plan Note (Signed)
Chronic, continue to manage pain, ambulate with walker. 03/19/16 X-ray R knee no definite radiographic evidence of acute fx, no joint effusion.  MRI 05/09/16, s/p inj, f/u Ortho, lateral right knee pain, worsen with walking with lifting up

## 2016-06-28 NOTE — Progress Notes (Signed)
Location:   [place] of Service:   clinic  Provider:  Marlana Latus  NP   Patient Care Team: Estill Dooms, MD as PCP - General (Internal Medicine) [name], MD (Ophthalmology) [name], MD (Psychiatry) Alanny Rivers Otho Darner, NP as Nurse Practitioner (Nurse Practitioner) [name], MD as Consulting Physician (Orthopedic Surgery)c  Extended Emergency Contact Information Primary Emergency Contact: [name], [place] Work Phone: [phone] Relation: Son Secondary Emergency Contact: [name] [place] of [place] Phone: [phone] Relation: Sister  Code Status:  DNR Goals of care: Advanced Directive information Advanced Directives 06/01/2016  Does patient have an advance directive? Yes  Type of Paramedic of [place];Living will;Out of facility DNR (pink MOST or yellow form)  Does patient want to make changes to advanced directive? No - Patient declined  Copy of advanced directive(s) in chart? Yes  Pre-existing out of facility DNR order (yellow form or pink MOST form) -     No chief complaint on file.   HPI:  Pt is a 75 y.o. female seen today for medical management of chronic diseases.    Impacted cerumen, removed with ear lavage   Hx of constipation, has been stable, f/u GI,  Hx of GERD, stable while on Famotidine and Omeprazole, anxiety, depression, mood has been stable while on Amitriptyline 50mg , Librium 25mg , Lorazepam bid, muscle and joints aches pains, R knee pain,  pain is managed with Norco bid, Tylenol, Flexeril prn HTN, controlled,  chronic ankle edema is minimal while taking Furosemide 20mg    Past Medical History:  Diagnosis Date  . Allergy   . Anemia   . Anxiety   . Asthma   . CAD (coronary artery disease)   . Depression   . Diverticulosis   . Dysfunction of eustachian tube   . Elevated LFTs   . Esophageal reflux   . Esophageal stricture 2002    . Hemorrhoids   . History of rectal polyps   . HLD (hyperlipidemia)   . HTN (hypertension)   . Hypertonicity of bladder   . Irritable bowel syndrome with constipation   . Obesity   . Optic neuritis   . Osteoporosis   . Peptic ulcer disease   . Peripheral neuropathy ([place])   . Trigeminal neuralgia    prior injury  . 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 Vitamin D deficiency    Past Surgical History:  Procedure Laterality Date  . ABDOMINAL HYSTERECTOMY    . ANAL RECTAL MANOMETRY N/A 07/05/2014   Procedure: ANO RECTAL MANOMETRY;  Surgeon: Leighton Ruff, MD;  Location: [facility];  Service: Endoscopy;  Laterality: N/A;  . APPENDECTOMY    . CAROTID ENDARTERECTOMY    . COLONOSCOPY  2008, 2015  . TONSILLECTOMY    . UPPER GASTROINTESTINAL ENDOSCOPY  2002    Allergies  Allergen Reactions  . Azithromycin   . Biaxin [Clarithromycin]   . Clarithromycin   . Doxycycline   . Fosamax [Alendronate Sodium]   . Geodon [Ziprasidone Hydrochloride]   . Lipitor [Atorvastatin Calcium]   . Penicillins   . Pravachol [Pravastatin Sodium]   . Statins   . Sulfonamide Derivatives   . Zocor [Simvastatin]       Medication List       Accurate as of 06/28/16  4:14 PM. Always use your most recent  med list.          acetaminophen 325 MG tablet Commonly known as:  TYLENOL Take 650 mg by mouth every 6 (six) hours as needed. And take 2 tabs by mouth at bedtime   amitriptyline 50 MG tablet Commonly known as:  ELAVIL Take 50 mg by mouth at bedtime.   aspirin 81 MG EC tablet Take 81 mg by mouth daily.   bisacodyl 10 MG suppository Commonly known as:  DULCOLAX Place 10 mg rectally. Insert 1 supp rectally every day as needed for constipation   bisacodyl 5 MG EC tablet Generic drug:  bisacodyl Take 5 mg by mouth. Take one tablet by mouth every day as needed for constipation   calcium-vitamin D 250-125 MG-UNIT tablet Commonly  known as:  OSCAL WITH D Take 1 tablet by mouth 2 (two) times daily. Reported on 12/27/2015   CENTRUM SILVER PO Take 1 tablet by mouth.   chlordiazePOXIDE 25 MG capsule Commonly known as:  LIBRIUM Take one capsule by mouth at bedtime for anxiety   cyclobenzaprine 5 MG tablet Commonly known as:  FLEXERIL Take 5 mg by mouth 3 (three) times daily as needed for muscle spasms.   famotidine 20 MG tablet Commonly known as:  PEPCID Take 20 mg by mouth at bedtime.   fluPHENAZine 1 MG tablet Commonly known as:  PROLIXIN Take 1 mg by mouth at bedtime.   fluticasone 50 MCG/ACT nasal spray Commonly known as:  FLONASE Place 1 spray into both nostrils 2 (two) times daily.   furosemide 20 MG tablet Commonly known as:  LASIX 1 by mouth every am diuretic for peripheral edema   HYDROcodone-acetaminophen 5-325 MG tablet Commonly known as:  NORCO/VICODIN Take one tablet by mouth twice daily for pain   LINZESS 290 MCG Caps capsule Generic drug:  linaclotide Take 290 mcg by mouth daily.   LORazepam 0.5 MG tablet Commonly known as:  ATIVAN Take one tablet by mouth twice daily for anxiety   magnesium hydroxide 400 MG/5ML suspension Commonly known as:  MILK OF MAGNESIA Take by mouth. Take 30 ml by mouth every day as needed for constipation Take with prune juice at bedtime   MYLANTA [id number] MG/5ML suspension Generic drug:  alum & mag hydroxide-simeth Take by mouth. Take 62ml every 4 hours as needed and in the evening at 9pm for indigestion   omeprazole 20 MG capsule Commonly known as:  PRILOSEC Take 20 mg by mouth daily.   polyvinyl alcohol 1.4 % ophthalmic solution Commonly known as:  LIQUIFILM TEARS Place 1 drop into both eyes 3 (three) times daily.       Review of Systems  Constitutional: Negative for chills, diaphoresis and fever.  HENT: Positive for hearing loss and tinnitus. Negative for congestion, ear discharge, ear pain and nosebleeds.   Eyes: Negative for photophobia,  pain and discharge.       Legally blind.   Respiratory: Negative for cough, shortness of breath and wheezing.   Cardiovascular: Negative for chest pain, palpitations and leg swelling.  Gastrointestinal: Positive for constipation. Negative for abdominal pain, diarrhea, nausea and vomiting.       C/o abd pain on and off. Not upon today's visit.   Genitourinary: Positive for frequency (adult depends). Negative for dysuria, flank pain and urgency.       Pressure in her bladder area.   Musculoskeletal: Negative for back pain, myalgias and neck pain.       C/o right knee pain for 4-5 days, still able  to ambulate with walker as usual.   Skin: Negative for rash.  Neurological: Positive for tremors (diffused rigidity. ). Negative for dizziness, seizures, weakness (generalized) and headaches.       Lips smacking  Psychiatric/Behavioral: Negative for suicidal ideas. The patient is nervous/anxious.        Hx of delusion, well controlled. Stated she wakes up a few times at night, but no difficulty return to sleep.     Immunization History  Administered Date(s) Administered  . Influenza Split 07/23/2013  . Influenza Whole 07/05/2009  . Influenza-Unspecified 07/29/2014, 06/14/2015  . PPD Test 06/17/2013  . Pneumococcal Polysaccharide-23 05/05/2009  . Td 05/05/2009  . Zoster 01/09/2011   Pertinent  Health Maintenance Due  Topic Date Due  . DEXA SCAN  07/12/2004  . PNA vac Low Risk Adult (2 of 2 - PCV13) 05/05/2010  . INFLUENZA VACCINE  04/24/2016   Fall Risk  07/29/2014  Falls in the past year? No   Functional Status Survey:    There were no vitals filed for this visit. There is no height or weight on file to calculate BMI. Physical Exam  Constitutional: She is oriented to person, place, and time. She appears well-developed and well-nourished. No distress.  HENT:  Head: Normocephalic and atraumatic.  Right Ear: External ear normal. No lacerations. No drainage, swelling or tenderness. No  foreign bodies. No mastoid tenderness. Tympanic membrane is not injected, not scarred, not perforated, not erythematous, not retracted and not bulging. No middle ear effusion. No hemotympanum. Decreased hearing is noted.  Left Ear: External ear normal. No lacerations. No drainage, swelling or tenderness. No foreign bodies. No mastoid tenderness. Tympanic membrane is not injected, not scarred, not perforated, not erythematous, not retracted and not bulging.  No middle ear effusion. No hemotympanum. Decreased hearing is noted.  Impacted cerumen, lavage, removed.   Eyes: Conjunctivae and EOM are normal. Pupils are equal, round, and reactive to light.  Neck: Neck supple.  Cardiovascular: Normal rate, regular rhythm and normal heart sounds.   Pulmonary/Chest: Effort normal and breath sounds normal. No respiratory distress. She has no rales. She exhibits no tenderness.  Abdominal: Soft. Bowel sounds are normal. She exhibits no distension. There is no tenderness. There is no rebound and no guarding.  External hemorrhoidsx2-not inflamed from previous exam.   Genitourinary: Rectal exam shows guaiac negative stool.  Genitourinary Comments: No hard stools in her rectal vault.   Musculoskeletal: She exhibits no edema (trace) or tenderness.       Left shoulder: She exhibits decreased range of motion, bony tenderness, swelling and pain. She exhibits no effusion, no crepitus, no deformity, no laceration, no spasm, normal pulse and normal strength.  C/o right knee pain with walking. No focal swelling, warmth, or deformity.   Neurological: She is alert and oriented to person, place, and time. She has normal reflexes. No cranial nerve deficit.  involuntary lip movements.   Skin: Skin is warm and dry.  Psychiatric: Her mood appears not anxious. Her affect is not angry, not blunt, not labile and not inappropriate. Her speech is not rapid and/or pressured, not delayed, not tangential and not slurred. She is slowed. She  is not agitated, not hyperactive, not withdrawn, not actively hallucinating and not combative. Thought content is not paranoid and not delusional. Cognition and memory are impaired. She does not express impulsivity or inappropriate judgment. She exhibits a depressed mood. She expresses no homicidal and no suicidal ideation. She expresses no suicidal plans and no homicidal plans. She  is communicative. She exhibits abnormal recent memory.  A little anxious/flat affect/slow in responding, no frank hallucinosis.  She is attentive.    Labs reviewed:  Recent Labs  07/02/15 02/14/16  NA 138 139  K 4.4 4.3  BUN 10 13  CREATININE 0.8 1.0    Recent Labs  07/02/15 02/14/16  AST 28 24  ALT 19 17  ALKPHOS 123 117    Recent Labs  07/02/15 02/14/16  WBC 6.8 6.9  HGB 12.3 12.1  HCT 38 36  PLT 369 320   Lab Results  Component Value Date   TSH 1.36 02/14/2016   Lab Results  Component Value Date   HGBA1C 6.0 02/14/2016   Lab Results  Component Value Date   CHOL 155 04/04/2013   HDL 53 04/04/2013   LDLCALC 80 04/04/2013   LDLDIRECT 190.0 03/24/2012   TRIG 108 04/04/2013   CHOLHDL 4 03/24/2012    Significant Diagnostic Results in last 30 days:  No results found.  Assessment/Plan There are no diagnoses linked to this encounter.Cerumen impaction Removed, ear lavage, will have debrox weekly.   Essential hypertension Blood pressure is controlled, continue Furosemide 20mg ,  02/14/16 Na 139, K 4.3, Bun 13, creat 0.96    Constipation 03/18/14 Dr: Carlean Purl Colonoscopy: severe diverticulosis was noted in the sigmoid colon, mild diverticulosis was noted in the right colon, the colon mucosa was otherwise normal.   If current regimen is not adequately treating constipation then she needs to return to see me for more thorough rectal evaluation(?defecation dysfunction).   Seeing GI regularly.   03/24/15 GI recommended to reduce Norco  1/301/7 stable, continue Linzess 246mcg daily, prn  MOM,  Dulcolax PR/PO  12/01/15 still c/o constipation, last BM this am. F/u GI  Chronic condition, has been managed by GI    GERD (gastroesophageal reflux disease) Stable, continue Famotidine 20mg , Omeprazole 20mg ,  prn Mintox 77ml q4h prn.    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  Psych hospitalization x2 in the past.  02/14/16 TSH 1.37    PERIPHERAL EDEMA Only trace edema seen in ankles, continue Furosemide 20mg  daily. 02/14/16 Na 139, K 4.3, Bun 13, creat 0.96    Right knee pain Chronic, continue to manage pain, ambulate with walker. 03/19/16 X-ray R knee no definite radiographic evidence of acute fx, no joint effusion.  MRI 05/09/16, s/p inj, f/u Ortho, lateral right knee pain, worsen with walking with lifting up       Family/ staff Communication: continue AL for care assistance  Labs/tests ordered: none

## 2016-06-28 NOTE — Assessment & Plan Note (Signed)
Only trace edema seen in ankles, continue Furosemide 20mg  daily. 02/14/16 Na 139, K 4.3, Bun 13, creat 0.96

## 2016-06-28 NOTE — Assessment & Plan Note (Signed)
Blood pressure is controlled, continue Furosemide 20mg ,  02/14/16 Na 139, K 4.3, Bun 13, creat 0.96

## 2016-06-28 NOTE — Assessment & Plan Note (Signed)
Removed, ear lavage, will have debrox weekly.

## 2016-06-28 NOTE — Assessment & Plan Note (Signed)
Stable, continue Famotidine 20mg , Omeprazole 20mg ,  prn Mintox 93ml q4h prn.

## 2016-06-28 NOTE — Assessment & Plan Note (Signed)
03/18/14 Dr: Carlean Purl Colonoscopy: severe diverticulosis was noted in the sigmoid colon, mild diverticulosis was noted in the right colon, the colon mucosa was otherwise normal.   If current regimen is not adequately treating constipation then she needs to return to see me for more thorough rectal evaluation(?defecation dysfunction).   Seeing GI regularly.   03/24/15 GI recommended to reduce Norco  1/301/7 stable, continue Linzess 245mcg daily, prn MOM,  Dulcolax PR/PO  12/01/15 still c/o constipation, last BM this am. F/u GI  Chronic condition, has been managed by GI

## 2016-06-28 NOTE — Assessment & Plan Note (Signed)
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  Psych hospitalization x2 in the past.  02/14/16 TSH 1.37

## 2016-06-28 NOTE — Assessment & Plan Note (Signed)
Adult depends for urine leakage 

## 2016-07-25 ENCOUNTER — Non-Acute Institutional Stay: Payer: Medicare Other | Admitting: Nurse Practitioner

## 2016-07-25 ENCOUNTER — Encounter: Payer: Self-pay | Admitting: Nurse Practitioner

## 2016-07-25 DIAGNOSIS — K219 Gastro-esophageal reflux disease without esophagitis: Secondary | ICD-10-CM | POA: Diagnosis not present

## 2016-07-25 DIAGNOSIS — K581 Irritable bowel syndrome with constipation: Secondary | ICD-10-CM

## 2016-07-25 DIAGNOSIS — F411 Generalized anxiety disorder: Secondary | ICD-10-CM | POA: Diagnosis not present

## 2016-07-25 DIAGNOSIS — M25561 Pain in right knee: Secondary | ICD-10-CM

## 2016-07-25 DIAGNOSIS — F418 Other specified anxiety disorders: Secondary | ICD-10-CM

## 2016-07-25 DIAGNOSIS — I10 Essential (primary) hypertension: Secondary | ICD-10-CM | POA: Diagnosis not present

## 2016-07-25 DIAGNOSIS — R609 Edema, unspecified: Secondary | ICD-10-CM | POA: Diagnosis not present

## 2016-07-25 DIAGNOSIS — N318 Other neuromuscular dysfunction of bladder: Secondary | ICD-10-CM

## 2016-07-25 NOTE — Assessment & Plan Note (Addendum)
Chronic, continue to manage pain, ambulate with walker. 03/19/16 X-ray R knee no definite radiographic evidence of acute fx, no joint effusion.  MRI 05/09/16, s/p inj, f/u Ortho, lateral right knee pain, worsen with walking with lifting up 05/15/16 Ortho, inj x2, bone bruising and lateral meniscal tear, not recommend surgery Continue Tylenol bid and prn, Norco bid,  Flexeril prn

## 2016-07-25 NOTE — Assessment & Plan Note (Signed)
Only trace edema seen in ankles, continue Furosemide 20mg  daily. 02/14/16 Na 139, K 4.3, Bun 13, creat 0.96 Update CMP

## 2016-07-25 NOTE — Assessment & Plan Note (Signed)
Adult depends for urine leakage 

## 2016-07-25 NOTE — Progress Notes (Signed)
Location:  [facility] Room Number: [phone] Place of Service:  ALF [phone]) Provider:  Anagha Loseke, Manxie    NP [name], MD  Patient Care Team: Estill Dooms, MD as PCP - General (Internal Medicine) [name], MD (Ophthalmology) [name], MD (Psychiatry) [place] Whedbee Otho Darner, NP as Nurse Practitioner (Nurse Practitioner) [name], MD as Consulting Physician (Orthopedic Surgery)  Extended Emergency Contact Information Primary Emergency Contact: [name], [place] of [place] Work Phone: [phone] Relation: Son Secondary Emergency Contact: [name] [place] of [place] Phone: [phone] Relation: Sister  Code Status:  DNR  Goals of care: Advanced Directive information Advanced Directives 07/25/2016  Does patient have an advance directive? Yes  Type of Paramedic of [place];Living will;Out of facility DNR (pink MOST or yellow form)  Does patient want to make changes to advanced directive? No - Patient declined  Copy of advanced directive(s) in chart? Yes  Pre-existing out of facility DNR order (yellow form or pink MOST form) -     Chief Complaint  Patient presents with  . Medical Management of Chronic Issues    HPI:  Pt is a 75 y.o. female seen today for medical management of chronic diseases.      Hx of constipation, has been stable, f/u GI, Hx of GERD, stable while on Famotidine and Omeprazole, anxiety, depression, mood has been stable while on Amitriptyline 50mg , Librium 25mg , Lorazepam bid, muscle and joints aches pains, R knee pain,  pain is managed with Norco bid, Tylenol, Flexeril prn HTN, controlled, chronic ankle edema is minimal while taking Furosemide 20mg , Bun/creat 13/1.0 02/14/16 Past Medical History:  Diagnosis Date  . Allergy   . Anemia   . Anxiety   . Asthma   . CAD (coronary artery disease)   . Depression   . Diverticulosis   . Dysfunction of eustachian  tube   . Elevated LFTs   . Esophageal reflux   . Esophageal stricture 2002  . Hemorrhoids   . History of rectal polyps   . HLD (hyperlipidemia)   . HTN (hypertension)   . Hypertonicity of bladder   . Irritable bowel syndrome with constipation   . Obesity   . Optic neuritis   . Osteoporosis   . Peptic ulcer disease   . Peripheral neuropathy ([place])   . Trigeminal neuralgia    prior injury  . 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 Vitamin D deficiency    Past Surgical History:  Procedure Laterality Date  . ABDOMINAL HYSTERECTOMY    . ANAL RECTAL MANOMETRY N/A 07/05/2014   Procedure: ANO RECTAL MANOMETRY;  Surgeon: Leighton Ruff, MD;  Location: [facility];  Service: Endoscopy;  Laterality: N/A;  . APPENDECTOMY    . CAROTID ENDARTERECTOMY    . COLONOSCOPY  2008, 2015  . TONSILLECTOMY    . UPPER GASTROINTESTINAL ENDOSCOPY  2002    Allergies  Allergen Reactions  . Azithromycin   . Biaxin [Clarithromycin]   . Clarithromycin   . Doxycycline   . Fosamax [Alendronate Sodium]   . Geodon [Ziprasidone Hydrochloride]   . Lipitor [Atorvastatin Calcium]   . Penicillins   . Pravachol [Pravastatin Sodium]   . Statins   . Sulfonamide Derivatives   . Zocor [Simvastatin]       Medication List       Accurate as of 07/25/16  1:57 PM. Always use your most recent med list.          acetaminophen 325 MG tablet Commonly known as:  TYLENOL Take 650 mg by mouth every 6 (six) hours as needed. And take 2 tabs by mouth at bedtime   amitriptyline 50 MG tablet Commonly known as:  ELAVIL Take 50 mg by mouth at bedtime.   aspirin 81 MG EC tablet Take 81 mg by mouth daily.   bisacodyl 10 MG suppository Commonly known as:  DULCOLAX Place 10 mg rectally. Insert 1 supp rectally every day as needed for constipation   bisacodyl 5 MG EC tablet Generic drug:  bisacodyl Take 5 mg by mouth. Take one tablet by mouth every day as  needed for constipation   calcium-vitamin D 250-125 MG-UNIT tablet Commonly known as:  OSCAL WITH D Take 1 tablet by mouth 2 (two) times daily. Reported on 12/27/2015   CENTRUM SILVER PO Take 1 tablet by mouth.   chlordiazePOXIDE 25 MG capsule Commonly known as:  LIBRIUM Take one capsule by mouth at bedtime for anxiety   cyclobenzaprine 5 MG tablet Commonly known as:  FLEXERIL Take 5 mg by mouth 3 (three) times daily as needed for muscle spasms.   famotidine 20 MG tablet Commonly known as:  PEPCID Take 20 mg by mouth at bedtime.   fluPHENAZine 1 MG tablet Commonly known as:  PROLIXIN Take 1 mg by mouth at bedtime.   fluticasone 50 MCG/ACT nasal spray Commonly known as:  FLONASE Place 1 spray into both nostrils 2 (two) times daily.   furosemide 20 MG tablet Commonly known as:  LASIX 1 by mouth every am diuretic for peripheral edema   HYDROcodone-acetaminophen 5-325 MG tablet Commonly known as:  NORCO/VICODIN Take one tablet by mouth twice daily for pain   LINZESS 290 MCG Caps capsule Generic drug:  linaclotide Take 290 mcg by mouth daily.   LORazepam 0.5 MG tablet Commonly known as:  ATIVAN Take one tablet by mouth twice daily for anxiety   magnesium hydroxide 400 MG/5ML suspension Commonly known as:  MILK OF MAGNESIA Take by mouth. Take 30 ml by mouth every day as needed for constipation Take with prune juice at bedtime   mometasone 0.1 % cream Commonly known as:  ELOCON Apply 1 application topically 2 (two) times daily as needed. To face   MYLANTA 200-200-20 MG/5ML suspension Generic drug:  alum & mag hydroxide-simeth Take by mouth. Take 95ml every 4 hours as needed and in the evening at 9pm for indigestion   omeprazole 20 MG capsule Commonly known as:  PRILOSEC Take 20 mg by mouth daily.   polyvinyl alcohol 1.4 % ophthalmic solution Commonly known as:  LIQUIFILM TEARS Place 1 drop into both eyes 3 (three) times daily.       Review of Systems    Constitutional: Negative for chills, diaphoresis and fever.  HENT: Positive for hearing loss and tinnitus. Negative for congestion, ear discharge, ear pain and nosebleeds.   Eyes: Negative for photophobia, pain and discharge.       Legally blind.   Respiratory: Negative for cough, shortness of breath and wheezing.   Cardiovascular: Negative for chest pain, palpitations and leg swelling.  Gastrointestinal: Positive for constipation. Negative for abdominal pain, diarrhea, nausea and vomiting.       C/o abd pain on and off. Not upon today's visit.   Genitourinary: Positive for frequency (adult depends). Negative for dysuria, flank pain and urgency.  Pressure in her bladder area.   Musculoskeletal: Negative for back pain, myalgias and neck pain.       C/o right knee pain for 4-5 days, still able to ambulate with walker as usual.   Skin: Negative for rash.  Neurological: Positive for tremors (diffused rigidity. ). Negative for dizziness, seizures, weakness (generalized) and headaches.       Lips smacking  Psychiatric/Behavioral: Negative for suicidal ideas. The patient is nervous/anxious.        Hx of delusion, well controlled. Stated she wakes up a few times at night, but no difficulty return to sleep.     Immunization History  Administered Date(s) Administered  . Influenza Split 07/23/2013  . Influenza Whole 07/05/2009  . Influenza-Unspecified 07/29/2014, 06/14/2015  . PPD Test 06/17/2013  . Pneumococcal Polysaccharide-23 05/05/2009  . Td 05/05/2009  . Zoster 01/09/2011   Pertinent  Health Maintenance Due  Topic Date Due  . DEXA SCAN  07/12/2004  . PNA vac Low Risk Adult (2 of 2 - PCV13) 05/05/2010  . INFLUENZA VACCINE  04/24/2016   Fall Risk  07/29/2014  Falls in the past year? No   Functional Status Survey:    Vitals:   07/25/16 1311  BP: 130/70  Pulse: 74  Resp: 18  Temp: 97.6 F (36.4 C)  Weight: 149 lb 6.4 oz (67.8 kg)  Height: 5' 1.5" (1.562 m)   Body mass  index is 27.77 kg/m. Physical Exam  Constitutional: She is oriented to person, place, and time. She appears well-developed and well-nourished. No distress.  HENT:  Head: Normocephalic and atraumatic.  Right Ear: External ear normal. No lacerations. No drainage, swelling or tenderness. No foreign bodies. No mastoid tenderness. Tympanic membrane is not injected, not scarred, not perforated, not erythematous, not retracted and not bulging. No middle ear effusion. No hemotympanum. Decreased hearing is noted.  Left Ear: External ear normal. No lacerations. No drainage, swelling or tenderness. No foreign bodies. No mastoid tenderness. Tympanic membrane is not injected, not scarred, not perforated, not erythematous, not retracted and not bulging.  No middle ear effusion. No hemotympanum. Decreased hearing is noted.  Impacted cerumen, lavage, removed.   Eyes: Conjunctivae and EOM are normal. Pupils are equal, round, and reactive to light.  Neck: Neck supple.  Cardiovascular: Normal rate, regular rhythm and normal heart sounds.   Pulmonary/Chest: Effort normal and breath sounds normal. No respiratory distress. She has no rales. She exhibits no tenderness.  Abdominal: Soft. Bowel sounds are normal. She exhibits no distension. There is no tenderness. There is no rebound and no guarding.  External hemorrhoidsx2-not inflamed from previous exam.   Genitourinary: Rectal exam shows guaiac negative stool.  Genitourinary Comments: No hard stools in her rectal vault.   Musculoskeletal: She exhibits no edema (trace) or tenderness.       Left shoulder: She exhibits decreased range of motion, bony tenderness, swelling and pain. She exhibits no effusion, no crepitus, no deformity, no laceration, no spasm, normal pulse and normal strength.  C/o right knee pain with walking. No focal swelling, warmth, or deformity.   Neurological: She is alert and oriented to person, place, and time. She has normal reflexes. No cranial  nerve deficit.  involuntary lip movements.   Skin: Skin is warm and dry.  Psychiatric: Her mood appears not anxious. Her affect is not angry, not blunt, not labile and not inappropriate. Her speech is not rapid and/or pressured, not delayed, not tangential and not slurred. She is slowed. She is not agitated,  not hyperactive, not withdrawn, not actively hallucinating and not combative. Thought content is not paranoid and not delusional. Cognition and memory are impaired. She does not express impulsivity or inappropriate judgment. She exhibits a depressed mood. She expresses no homicidal and no suicidal ideation. She expresses no suicidal plans and no homicidal plans. She is communicative. She exhibits abnormal recent memory.  A little anxious/flat affect/slow in responding, no frank hallucinosis.  She is attentive.    Labs reviewed:  Recent Labs  02/14/16  NA 139  K 4.3  BUN 13  CREATININE 1.0    Recent Labs  02/14/16  AST 24  ALT 17  ALKPHOS 117    Recent Labs  02/14/16  WBC 6.9  HGB 12.1  HCT 36  PLT 320   Lab Results  Component Value Date   TSH 1.36 02/14/2016   Lab Results  Component Value Date   HGBA1C 6.0 02/14/2016   Lab Results  Component Value Date   CHOL 155 04/04/2013   HDL 53 04/04/2013   LDLCALC 80 04/04/2013   LDLDIRECT 190.0 03/24/2012   TRIG 108 04/04/2013   CHOLHDL 4 03/24/2012    Significant Diagnostic Results in last 30 days:  No results found.  Assessment/Plan Essential hypertension Blood pressure is controlled, continue Furosemide 20mg ,  02/14/16 Na 139, K 4.3, Bun 13, creat 0.96 Update CMP  Irritable bowel syndrome 03/18/14 Dr: Carlean Purl Colonoscopy: severe diverticulosis was noted in the sigmoid colon, mild diverticulosis was noted in the right colon, the colon mucosa was otherwise normal.   If current regimen is not adequately treating constipation then she needs to return to see me for more thorough rectal evaluation(?defecation  dysfunction).   Seeing GI regularly.   03/24/15 GI recommended to reduce Norco  1/301/7 stable, continue Linzess 261mcg daily, prn MOM,  Dulcolax PR/PO  12/01/15 still c/o constipation, last BM this am. F/u GI Continue Linzess, MOM, prn Bisacodyl po/PR, prn enema.   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  Psych hospitalization x2 in the past.  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  Psych hospitalization x2 in the past.  02/14/16 TSH 1.37   PERIPHERAL EDEMA Only trace edema seen in ankles, continue Furosemide 20mg  daily. 02/14/16 Na 139, K 4.3, Bun 13, creat 0.96 Update CMP  Right knee pain Chronic, continue to manage pain, ambulate with walker. 03/19/16 X-ray R knee no definite radiographic evidence of acute fx, no joint effusion.  MRI 05/09/16, s/p inj, f/u Ortho, lateral right knee pain, worsen with walking with lifting up 05/15/16 Ortho, inj x2, bone bruising and lateral meniscal tear, not recommend surgery Continue Tylenol bid and prn, Norco bid,  Flexeril prn      Family/ staff Communication: continue AL for care assistance.   Labs/tests ordered:  CBC CMP

## 2016-07-25 NOTE — Assessment & Plan Note (Signed)
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  Psych hospitalization x2 in the past.  02/14/16 TSH 1.37

## 2016-07-25 NOTE — Assessment & Plan Note (Addendum)
Stable, continue Famotidine 20mg , Omeprazole 20mg , Mylanta qhs and prn Update CBC

## 2016-07-25 NOTE — Assessment & Plan Note (Addendum)
03/18/14 Dr: Carlean Purl Colonoscopy: severe diverticulosis was noted in the sigmoid colon, mild diverticulosis was noted in the right colon, the colon mucosa was otherwise normal.   If current regimen is not adequately treating constipation then she needs to return to see me for more thorough rectal evaluation(?defecation dysfunction).   Seeing GI regularly.   03/24/15 GI recommended to reduce Norco  1/301/7 stable, continue Linzess 261mcg daily, prn MOM,  Dulcolax PR/PO  12/01/15 still c/o constipation, last BM this am. F/u GI Continue Linzess, MOM, prn Bisacodyl po/PR, prn enema.   Chronic condition, has been managed by GI

## 2016-07-25 NOTE — Assessment & Plan Note (Signed)
Blood pressure is controlled, continue Furosemide 20mg ,  02/14/16 Na 139, K 4.3, Bun 13, creat 0.96 Update CMP

## 2016-07-26 LAB — CBC AND DIFFERENTIAL
HCT: 36 % (ref 36–46)
Hemoglobin: 12.1 g/dL (ref 12.0–16.0)
PLATELETS: 306 10*3/uL (ref 150–399)
WBC: 7.6 10*3/mL

## 2016-07-26 LAB — BASIC METABOLIC PANEL
BUN: 16 mg/dL (ref 4–21)
CREATININE: 0.9 mg/dL (ref <=1.1)
GLUCOSE: 89 mg/dL
Potassium: 4.2 mmol/L (ref 3.4–5.3)
Sodium: 141 mmol/L (ref 137–147)

## 2016-07-26 LAB — HEPATIC FUNCTION PANEL
ALT: 17 U/L (ref 7–35)
AST: 24 U/L (ref 13–35)
Alkaline Phosphatase: 124 U/L (ref 25–125)
Bilirubin, Total: 0.3 mg/dL

## 2016-08-01 ENCOUNTER — Other Ambulatory Visit: Payer: Self-pay | Admitting: *Deleted

## 2016-08-08 ENCOUNTER — Other Ambulatory Visit: Payer: Self-pay | Admitting: *Deleted

## 2016-08-08 MED ORDER — HYDROCODONE-ACETAMINOPHEN 5-325 MG PO TABS: ORAL_TABLET | ORAL | 0 refills | Status: DC

## 2016-08-08 NOTE — Telephone Encounter (Signed)
[facility] [phone] Fax: [phone]

## 2016-09-06 ENCOUNTER — Non-Acute Institutional Stay: Payer: Medicare Other | Admitting: Nurse Practitioner

## 2016-09-06 ENCOUNTER — Encounter: Payer: Self-pay | Admitting: Nurse Practitioner

## 2016-09-06 DIAGNOSIS — K581 Irritable bowel syndrome with constipation: Secondary | ICD-10-CM

## 2016-09-06 DIAGNOSIS — F411 Generalized anxiety disorder: Secondary | ICD-10-CM

## 2016-09-06 DIAGNOSIS — I10 Essential (primary) hypertension: Secondary | ICD-10-CM | POA: Diagnosis not present

## 2016-09-06 DIAGNOSIS — N318 Other neuromuscular dysfunction of bladder: Secondary | ICD-10-CM

## 2016-09-06 DIAGNOSIS — K219 Gastro-esophageal reflux disease without esophagitis: Secondary | ICD-10-CM | POA: Diagnosis not present

## 2016-09-06 DIAGNOSIS — M25561 Pain in right knee: Secondary | ICD-10-CM

## 2016-09-06 NOTE — Assessment & Plan Note (Signed)
Adult depends for urine leakage 

## 2016-09-06 NOTE — Assessment & Plan Note (Signed)
03/18/14 Dr: Carlean Purl Colonoscopy: severe diverticulosis was noted in the sigmoid colon, mild diverticulosis was noted in the right colon, the colon mucosa was otherwise normal.   If current regimen is not adequately treating constipation then she needs to return to see me for more thorough rectal evaluation(?defecation dysfunction).   Seeing GI regularly.   03/24/15 GI recommended to reduce Norco  1/301/7 stable, continue Linzess 227mcg daily, prn MOM,  Dulcolax PR/PO  12/01/15 still c/o constipation, last BM this am. F/u GI Continue Linzess, MOM, prn Bisacodyl po/PR, prn enema.   Chronic condition, has been managed by GI

## 2016-09-06 NOTE — Assessment & Plan Note (Addendum)
Blood pressure is controlled, continue Furosemide 20mg , 07/26/16 wbc 7.6, Hgb 12.1, plt 306, Na 141, K 4.2, Bun 16, creat 0.87, TP 6.9, albumin 3.9

## 2016-09-06 NOTE — Assessment & Plan Note (Addendum)
Chronic, continue to manage pain, ambulate with walker. 03/19/16 X-ray R knee no definite radiographic evidence of acute fx, no joint effusion.  MRI 05/09/16, s/p inj, f/u Ortho, lateral right knee pain, worsen with walking with lifting up 05/15/16 Ortho, inj x2, bone bruising and lateral meniscal tear, not recommend surgery Continue Tylenol hs and prn, Norco bid,  Flexeril prn

## 2016-09-06 NOTE — Assessment & Plan Note (Signed)
Stable, continue Famotidine 20mg, Omeprazole 20mg, Mylanta qhs and prn 

## 2016-09-06 NOTE — Assessment & Plan Note (Signed)
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  Psych hospitalization x2 in the past.  02/14/16 TSH 1.37

## 2016-09-06 NOTE — Progress Notes (Signed)
Location:  [facility] Room Number: [phone] Place of Service:  SNF (31) Provider:  Mast, Manxie  NP  [name], MD  Patient Care Team: Estill Dooms, MD as PCP - General (Internal Medicine) [name], MD (Ophthalmology) [name], MD (Psychiatry) Man Otho Darner, NP as Nurse Practitioner (Nurse Practitioner) [name], MD as Consulting Physician (Orthopedic Surgery)  Extended Emergency Contact Information Primary Emergency Contact: [name], [place] Work Phone: [phone] Relation: Son Secondary Emergency Contact: [name] [place] Phone: [phone] Relation: Sister  Code Status:  DNR Goals of care: Advanced Directive information Advanced Directives 09/06/2016  Does Patient Have a Medical Advance Directive? Yes  Type of Paramed[place] of [place];Living will;Out of facility DNR (pink MOST or yellow form)  Does patient want to make changes to medical advance directive? No - Patient declined  Copy of [place] in Chart? Yes  Pre-existing out of facility DNR order (yellow form or pink MOST form) -     Chief Complaint  Patient presents with  . Medical Management of Chronic Issues    HPI:  Pt is a 75 y.o. female seen today for medical management of chronic diseases.    Hx of constipation, has been stable, f/u GI, Hx of GERD, stable while on Famotidine and Omeprazole, anxiety, depression, mood has been stable while on Amitriptyline 50mg , Librium 25mg , Lorazepam bid, muscle and joints aches pains, R knee pain, pain is managed with Norco bid, Tylenol, Flexeril prn HTN, controlled, chronic ankle edema is minimal while taking Furosemide 20mg , Bun/creat 16/0.9 07/26/16   Past Medical History:  Diagnosis Date  . Allergy   . Anemia   . Anxiety   . Asthma   . CAD (coronary artery disease)   . Depression   . Diverticulosis   .  Dysfunction of eustachian tube   . Elevated LFTs   . Esophageal reflux   . Esophageal stricture 2002  . Hemorrhoids   . History of rectal polyps   . HLD (hyperlipidemia)   . HTN (hypertension)   . Hypertonicity of bladder   . Irritable bowel syndrome with constipation   . Obesity   . Optic neuritis   . Osteoporosis   . Peptic ulcer disease   . Peripheral neuropathy ([employer])   . Trigeminal neuralgia    prior injury  . 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 Vitamin D deficiency    Past Surgical History:  Procedure Laterality Date  . ABDOMINAL HYSTERECTOMY    . ANAL RECTAL MANOMETRY N/A 07/05/2014   Procedure: ANO RECTAL MANOMETRY;  Surgeon: Leighton Ruff, MD;  Location: [facility];  Service: Endoscopy;  Laterality: N/A;  . APPENDECTOMY    . CAROTID ENDARTERECTOMY    . COLONOSCOPY  2008, 2015  . TONSILLECTOMY    . UPPER GASTROINTESTINAL ENDOSCOPY  2002    Allergies  Allergen Reactions  . Azithromycin   . Biaxin [Clarithromycin]   . Clarithromycin   . Doxycycline   . Fosamax [Alendronate Sodium]   . Geodon [Ziprasidone Hydrochloride]   . Lipitor [Atorvastatin Calcium]   . Penicillins   . Pravachol [Pravastatin Sodium]   . Statins   . Sulfonamide Derivatives   . Zocor [Simvastatin]       Medication List       Accurate as of 09/06/16  1:24 PM. Always use your most recent med list.          acetaminophen 325 MG tablet Commonly known as:  TYLENOL Take 650 mg by mouth every 6 (six) hours as needed. And take 2 tabs by mouth at bedtime   amitriptyline 50 MG tablet Commonly known as:  ELAVIL Take 50 mg by mouth at bedtime.   aspirin 81 MG EC tablet Take 81 mg by mouth daily.   bisacodyl 10 MG suppository Commonly known as:  DULCOLAX Place 10 mg rectally. Insert 1 supp rectally every day as needed for constipation   bisacodyl 5 MG EC tablet Generic drug:  bisacodyl Take 5 mg by mouth. Take one  tablet by mouth every day as needed for constipation   calcium-vitamin D 250-125 MG-UNIT tablet Commonly known as:  OSCAL WITH D Take 1 tablet by mouth 2 (two) times daily. Reported on 12/27/2015   carbamide peroxide 6.5 % otic solution Commonly known as:  DEBROX Place 5 drops into both ears 2 (two) times daily.   CENTRUM SILVER PO Take 1 tablet by mouth.   chlordiazePOXIDE 25 MG capsule Commonly known as:  LIBRIUM Take one capsule by mouth at bedtime for anxiety   cyclobenzaprine 5 MG tablet Commonly known as:  FLEXERIL Take 5 mg by mouth 3 (three) times daily as needed for muscle spasms.   famotidine 20 MG tablet Commonly known as:  PEPCID Take 20 mg by mouth at bedtime.   fluPHENAZine 1 MG tablet Commonly known as:  PROLIXIN Take 1 mg by mouth at bedtime.   fluticasone 50 MCG/ACT nasal spray Commonly known as:  FLONASE Place 1 spray into both nostrils 2 (two) times daily.   furosemide 20 MG tablet Commonly known as:  LASIX 1 by mouth every am diuretic for peripheral edema   HYDROcodone-acetaminophen 5-325 MG tablet Commonly known as:  NORCO/VICODIN Take one tablet by mouth twice daily for pain   hydrocortisone cream 1 % Apply 1 application topically 2 (two) times daily.   LINZESS 290 MCG Caps capsule Generic drug:  linaclotide Take 290 mcg by mouth daily.   LORazepam 0.5 MG tablet Commonly known as:  ATIVAN Take one tablet by mouth twice daily for anxiety   magnesium hydroxide 400 MG/5ML suspension Commonly known as:  MILK OF MAGNESIA Take by mouth. Take 30 ml by mouth every day as needed for constipation Take with prune juice at bedtime   mometasone 0.1 % cream Commonly known as:  ELOCON Apply 1 application topically 2 (two) times daily as needed. To face   MYLANTA 200-200-20 MG/5ML suspension Generic drug:  alum & mag hydroxide-simeth Take by mouth. Take 67ml every 4 hours as needed and in the evening at 9pm for indigestion   omeprazole 20 MG  capsule Commonly known as:  PRILOSEC Take 20 mg by mouth daily.   polyvinyl alcohol 1.4 % ophthalmic solution Commonly known as:  LIQUIFILM TEARS Place 1 drop into both eyes 3 (three) times daily.   saccharomyces boulardii 250 MG capsule Commonly known as:  FLORASTOR Take 250 mg by mouth 2 (two) times daily.       Review of Systems  Constitutional: Negative for chills, diaphoresis and fever.  HENT: Positive for hearing loss and tinnitus. Negative for congestion, ear discharge, ear pain and nosebleeds.   Eyes: Negative for photophobia, pain and discharge.       Legally blind.   Respiratory: Negative for cough, shortness of breath and wheezing.   Cardiovascular: Negative  for chest pain, palpitations and leg swelling.  Gastrointestinal: Positive for constipation. Negative for abdominal pain, diarrhea, nausea and vomiting.       C/o abd pain on and off. Not upon today's visit.   Genitourinary: Positive for frequency (adult depends). Negative for dysuria, flank pain and urgency.       Pressure in her bladder area.   Musculoskeletal: Negative for back pain, myalgias and neck pain.       C/o right knee pain for 4-5 days, still able to ambulate with walker as usual.   Skin: Negative for rash.  Neurological: Positive for tremors (diffused rigidity. ). Negative for dizziness, seizures, weakness (generalized) and headaches.       Lips smacking  Psychiatric/Behavioral: Negative for suicidal ideas. The patient is nervous/anxious.        Hx of delusion, well controlled. Stated she wakes up a few times at night, but no difficulty return to sleep.     Immunization History  Administered Date(s) Administered  . Influenza Split 07/23/2013  . Influenza Whole 07/05/2009  . Influenza-Unspecified 07/29/2014, 06/14/2015, 07/11/2016  . PPD Test 06/17/2013  . Pneumococcal Polysaccharide-23 05/05/2009  . Td 05/05/2009  . Zoster 01/09/2011   Pertinent  Health Maintenance Due  Topic Date Due  .  DEXA SCAN  07/12/2004  . PNA vac Low Risk Adult (2 of 2 - PCV13) 05/05/2010  . INFLUENZA VACCINE  Completed   Fall Risk  07/29/2014  Falls in the past year? No   Functional Status Survey:    Vitals:   09/06/16 1206  BP: 138/72  Pulse: 68  Resp: 16  Temp: 98.3 F (36.8 C)  Weight: 150 lb 3.2 oz (68.1 kg)  Height: 5' 1.5" (1.562 m)   Body mass index is 27.92 kg/m. Physical Exam  Constitutional: She is oriented to person, place, and time. She appears well-developed and well-nourished. No distress.  HENT:  Head: Normocephalic and atraumatic.  Right Ear: External ear normal. No lacerations. No drainage, swelling or tenderness. No foreign bodies. No mastoid tenderness. Tympanic membrane is not injected, not scarred, not perforated, not erythematous, not retracted and not bulging. No middle ear effusion. No hemotympanum. Decreased hearing is noted.  Left Ear: External ear normal. No lacerations. No drainage, swelling or tenderness. No foreign bodies. No mastoid tenderness. Tympanic membrane is not injected, not scarred, not perforated, not erythematous, not retracted and not bulging.  No middle ear effusion. No hemotympanum. Decreased hearing is noted.  Impacted cerumen, lavage, removed.   Eyes: Conjunctivae and EOM are normal. Pupils are equal, round, and reactive to light.  Neck: Neck supple.  Cardiovascular: Normal rate, regular rhythm and normal heart sounds.   Pulmonary/Chest: Effort normal and breath sounds normal. No respiratory distress. She has no rales. She exhibits no tenderness.  Abdominal: Soft. Bowel sounds are normal. She exhibits no distension. There is no tenderness. There is no rebound and no guarding.  External hemorrhoidsx2-not inflamed from previous exam.   Genitourinary: Rectal exam shows guaiac negative stool.  Genitourinary Comments: No hard stools in her rectal vault.   Musculoskeletal: She exhibits no edema (trace) or tenderness.       Left shoulder: She  exhibits decreased range of motion, bony tenderness, swelling and pain. She exhibits no effusion, no crepitus, no deformity, no laceration, no spasm, normal pulse and normal strength.  C/o right knee pain with walking. No focal swelling, warmth, or deformity.   Neurological: She is alert and oriented to person, place, and time. She has  normal reflexes. No cranial nerve deficit.  involuntary lip movements.   Skin: Skin is warm and dry.  Psychiatric: Her mood appears not anxious. Her affect is not angry, not blunt, not labile and not inappropriate. Her speech is not rapid and/or pressured, not delayed, not tangential and not slurred. She is slowed. She is not agitated, not hyperactive, not withdrawn, not actively hallucinating and not combative. Thought content is not paranoid and not delusional. Cognition and memory are impaired. She does not express impulsivity or inappropriate judgment. She exhibits a depressed mood. She expresses no homicidal and no suicidal ideation. She expresses no suicidal plans and no homicidal plans. She is communicative. She exhibits abnormal recent memory.  A little anxious/flat affect/slow in responding, no frank hallucinosis.  She is attentive.    Labs reviewed:  Recent Labs  02/14/16 07/26/16  NA 139 141  K 4.3 4.2  BUN 13 16  CREATININE 1.0 0.9    Recent Labs  02/14/16 07/26/16  AST 24 24  ALT 17 17  ALKPHOS 117 124    Recent Labs  02/14/16 07/26/16  WBC 6.9 7.6  HGB 12.1 12.1  HCT 36 36  PLT 320 306   Lab Results  Component Value Date   TSH 1.36 02/14/2016   Lab Results  Component Value Date   HGBA1C 6.0 02/14/2016   Lab Results  Component Value Date   CHOL 155 04/04/2013   HDL 53 04/04/2013   LDLCALC 80 04/04/2013   LDLDIRECT 190.0 03/24/2012   TRIG 108 04/04/2013   CHOLHDL 4 03/24/2012    Significant Diagnostic Results in last 30 days:  No results found.  Assessment/Plan Essential hypertension Blood pressure is controlled,  continue Furosemide 20mg , 07/26/16 wbc 7.6, Hgb 12.1, plt 306, Na 141, K 4.2, Bun 16, creat 0.87, TP 6.9, albumin 3.9    Irritable bowel syndrome 03/18/14 Dr: Carlean Purl Colonoscopy: severe diverticulosis was noted in the sigmoid colon, mild diverticulosis was noted in the right colon, the colon mucosa was otherwise normal.   If current regimen is not adequately treating constipation then she needs to return to see me for more thorough rectal evaluation(?defecation dysfunction).   Seeing GI regularly.   03/24/15 GI recommended to reduce Norco  1/301/7 stable, continue Linzess 29mcg daily, prn MOM,  Dulcolax PR/PO  12/01/15 still c/o constipation, last BM this am. F/u GI Continue Linzess, MOM, prn Bisacodyl po/PR, prn enema.   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  Psych hospitalization x2 in the past.  02/14/16 TSH 1.37   Right knee pain Chronic, continue to manage pain, ambulate with walker. 03/19/16 X-ray R knee no definite radiographic evidence of acute fx, no joint effusion.  MRI 05/09/16, s/p inj, f/u Ortho, lateral right knee pain, worsen with walking with lifting up 05/15/16 Ortho, inj x2, bone bruising and lateral meniscal tear, not recommend surgery Continue Tylenol hs and prn, Norco bid,  Flexeril prn      Family/ staff Communication: AL  Labs/tests ordered:  none

## 2016-09-07 ENCOUNTER — Other Ambulatory Visit: Payer: Self-pay

## 2016-09-07 MED ORDER — HYDROCODONE-ACETAMINOPHEN 5-325 MG PO TABS: ORAL_TABLET | ORAL | 0 refills | Status: DC

## 2016-09-07 NOTE — Telephone Encounter (Signed)
RX faxed to [facility] @ [phone], phone number [phone] 

## 2016-09-26 ENCOUNTER — Encounter: Payer: Self-pay | Admitting: Nurse Practitioner

## 2016-09-26 DIAGNOSIS — J189 Pneumonia, unspecified organism: Secondary | ICD-10-CM | POA: Insufficient documentation

## 2016-09-26 DIAGNOSIS — J209 Acute bronchitis, unspecified: Secondary | ICD-10-CM | POA: Insufficient documentation

## 2016-10-08 ENCOUNTER — Non-Acute Institutional Stay: Payer: Medicare Other | Admitting: Internal Medicine

## 2016-10-08 ENCOUNTER — Encounter: Payer: Self-pay | Admitting: Internal Medicine

## 2016-10-08 DIAGNOSIS — I10 Essential (primary) hypertension: Secondary | ICD-10-CM | POA: Diagnosis not present

## 2016-10-08 DIAGNOSIS — F418 Other specified anxiety disorders: Secondary | ICD-10-CM | POA: Diagnosis not present

## 2016-10-08 DIAGNOSIS — E785 Hyperlipidemia, unspecified: Secondary | ICD-10-CM | POA: Diagnosis not present

## 2016-10-08 DIAGNOSIS — K581 Irritable bowel syndrome with constipation: Secondary | ICD-10-CM

## 2016-10-08 NOTE — Progress Notes (Signed)
Progress Note    Location:  [facility] Room Number: [place] Place of Service:  ALF [phone]) Provider:  [name], MD  Patient Care Team: Estill Dooms, MD as PCP - General (Internal Medicine) [name], MD (Ophthalmology) [name], MD (Psychiatry) Man Otho Darner, NP as Nurse Practitioner (Nurse Practitioner) [name], MD as Consulting Physician (Orthopedic Surgery)  Extended Emergency Contact Information Primary Emergency Contact: [name], [place] of [place] Work Phone: [phone] Relation: Son Secondary Emergency Contact: [name] [place] of [place] Phone: [phone] Relation: Sister  Code Status:  DNR Goals of care: Advanced Directive information Advanced Directives 10/08/2016  Does Patient Have a Medical Advance Directive? Yes  Type of Paramedic of [place];Living will;Out of facility DNR (pink MOST or yellow form)  Does patient want to make changes to medical advance directive? -  Copy of [place] in Chart? Yes  Pre-existing out of facility DNR order (yellow form or pink MOST form) Yellow form placed in chart (order not valid for inpatient use)     Chief Complaint  Patient presents with  . Medical Management of Chronic Issues    routine visit    HPI:  Pt is a 76 y.o. female seen today for medical management of chronic diseases.    Acute bronchitis, unspecified organism - treated with Medrol dosepak, Augmentin, and Mucinex in late Dec 2017. Resolved.  Essential hypertension - controlled  Hyperlipidemia, unspecified hyperlipidemia type - controlled  Irritable bowel syndrome with constipation - doing better  Depression with anxiety - cheerful today.      Past Medical History:  Diagnosis Date  . Allergy   . Anemia   . Anxiety   . Asthma   . CAD (coronary artery disease)   . Depression   . Diverticulosis   .  Dysfunction of eustachian tube   . Elevated LFTs   . Esophageal reflux   . Esophageal stricture 2002  . Hemorrhoids   . History of rectal polyps   . HLD (hyperlipidemia)   . HTN (hypertension)   . Hypertonicity of bladder   . Irritable bowel syndrome with constipation   . Obesity   . Optic neuritis   . Osteoporosis   . Peptic ulcer disease   . Peripheral neuropathy ([place])   . Trigeminal neuralgia    prior injury  . 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 Vitamin D deficiency    Past Surgical History:  Procedure Laterality Date  . ABDOMINAL HYSTERECTOMY    . ANAL RECTAL MANOMETRY N/A 07/05/2014   Procedure: ANO RECTAL MANOMETRY;  Surgeon: Leighton Ruff, MD;  Location: [facility];  Service: Endoscopy;  Laterality: N/A;  . APPENDECTOMY    . CAROTID ENDARTERECTOMY    . COLONOSCOPY  2008, 2015  . TONSILLECTOMY    . UPPER GASTROINTESTINAL ENDOSCOPY  2002    Allergies  Allergen Reactions  . Azithromycin   . Biaxin [Clarithromycin]   . Clarithromycin   . Doxycycline   . Fosamax [Alendronate Sodium]   . Geodon [Ziprasidone Hydrochloride]   . Lipitor [Atorvastatin Calcium]   . Penicillins   . Pravachol [Pravastatin Sodium]   . Statins   . Sulfonamide Derivatives   . Zocor [Simvastatin]     Allergies as of 10/08/2016      Reactions   Azithromycin  Biaxin [clarithromycin]    Clarithromycin    Doxycycline    Fosamax [alendronate Sodium]    Geodon [ziprasidone Hydrochloride]    Lipitor [atorvastatin Calcium]    Penicillins    Pravachol [pravastatin Sodium]    Statins    Sulfonamide Derivatives    Zocor [simvastatin]       Medication List       Accurate as of 10/08/16  2:04 PM. Always use your most recent med list.          acetaminophen 325 MG tablet Commonly known as:  TYLENOL Take 650 mg by mouth every 6 (six) hours as needed. And take 2 tabs by mouth at bedtime   amitriptyline 50 MG  tablet Commonly known as:  ELAVIL Take 50 mg by mouth at bedtime.   aspirin 81 MG EC tablet Take 81 mg by mouth daily.   bisacodyl 10 MG suppository Commonly known as:  DULCOLAX Place 10 mg rectally. Insert 1 supp rectally every day as needed for constipation   bisacodyl 5 MG EC tablet Generic drug:  bisacodyl Take 5 mg by mouth. Take one tablet by mouth every day as needed for constipation   calcium-vitamin D 250-125 MG-UNIT tablet Commonly known as:  OSCAL WITH D Take 1 tablet by mouth 2 (two) times daily. Reported on 12/27/2015   carbamide peroxide 6.5 % otic solution Commonly known as:  DEBROX Place 5 drops into both ears 2 (two) times daily.   CENTRUM SILVER PO Take 1 tablet by mouth.   chlordiazePOXIDE 25 MG capsule Commonly known as:  LIBRIUM Take one capsule by mouth at bedtime for anxiety   cyclobenzaprine 5 MG tablet Commonly known as:  FLEXERIL Take 5 mg by mouth 3 (three) times daily as needed for muscle spasms.   famotidine 20 MG tablet Commonly known as:  PEPCID Take 20 mg by mouth at bedtime.   fluPHENAZine 1 MG tablet Commonly known as:  PROLIXIN Take 1 mg by mouth at bedtime.   fluticasone 50 MCG/ACT nasal spray Commonly known as:  FLONASE Place 1 spray into both nostrils 2 (two) times daily.   furosemide 20 MG tablet Commonly known as:  LASIX 1 by mouth every am diuretic for peripheral edema   HYDROcodone-acetaminophen 5-325 MG tablet Commonly known as:  NORCO/VICODIN Take one tablet by mouth twice daily for pain   hydrocortisone cream 1 % Apply 1 application topically 2 (two) times daily.   LINZESS 290 MCG Caps capsule Generic drug:  linaclotide Take 290 mcg by mouth daily.   LORazepam 0.5 MG tablet Commonly known as:  ATIVAN Take one tablet by mouth twice daily for anxiety   magnesium hydroxide 400 MG/5ML suspension Commonly known as:  MILK OF MAGNESIA Take by mouth. Take 30 ml by mouth every day as needed for constipation Take with  prune juice at bedtime   mometasone 0.1 % cream Commonly known as:  ELOCON Apply 1 application topically 2 (two) times daily as needed. To face   MYLANTA 200-200-20 MG/5ML suspension Generic drug:  alum & mag hydroxide-simeth Take by mouth. Take 29ml every 4 hours as needed and at bedtime for indigestion   omeprazole 20 MG capsule Commonly known as:  PRILOSEC Take 20 mg by mouth daily.   polyvinyl alcohol 1.4 % ophthalmic solution Commonly known as:  LIQUIFILM TEARS Place 1 drop into both eyes 3 (three) times daily.   saccharomyces boulardii 250 MG capsule Commonly known as:  FLORASTOR Take 250 mg by mouth. Take one  capsule once a day   sodium phosphate Pediatric 3.5-9.5 GM/59ML enema Place 1 enema rectally. Insert one dose every three days       Review of Systems  Constitutional: Negative for chills, diaphoresis and fever.  HENT: Positive for hearing loss and tinnitus. Negative for congestion, ear discharge, ear pain and nosebleeds.   Eyes: Negative for photophobia, pain and discharge.       Legally blind.   Respiratory: Negative for cough, shortness of breath and wheezing.   Cardiovascular: Negative for chest pain, palpitations and leg swelling.  Gastrointestinal: Positive for constipation. Negative for abdominal pain, diarrhea, nausea and vomiting.       C/o abd pain on and off. Not upon today's visit.   Genitourinary: Positive for frequency (adult depends). Negative for dysuria, flank pain and urgency.       Pressure in her bladder area.   Musculoskeletal: Negative for back pain, myalgias and neck pain.       C/o chronic right knee pain of variable intensity, still able to ambulate with walker as usual.   Skin: Negative for rash.  Neurological: Positive for tremors (diffused rigidity. ). Negative for dizziness, seizures, weakness (generalized) and headaches.       Lips smacking  Psychiatric/Behavioral: Negative for suicidal ideas. The patient is nervous/anxious.         Hx of delusion, well controlled. Stated she wakes up a few times at night, but no difficulty return to sleep.     Immunization History  Administered Date(s) Administered  . Influenza Split 07/23/2013  . Influenza Whole 07/05/2009  . Influenza-Unspecified 07/29/2014, 06/14/2015, 07/11/2016  . PPD Test 06/17/2013  . Pneumococcal Polysaccharide-23 05/05/2009  . Td 05/05/2009  . Zoster 01/09/2011   Pertinent  Health Maintenance Due  Topic Date Due  . DEXA SCAN  07/12/2004  . PNA vac Low Risk Adult (2 of 2 - PCV13) 05/05/2010  . INFLUENZA VACCINE  Completed   Fall Risk  07/29/2014  Falls in the past year? No   Functional Status Survey:    Vitals:   10/08/16 1351  BP: 128/72  Pulse: 70  Resp: 18  Temp: 97.1 F (36.2 C)  Weight: 152 lb (68.9 kg)  Height: 5' 1.5" (1.562 m)   Body mass index is 28.26 kg/m. Physical Exam  Constitutional: She is oriented to person, place, and time. She appears well-developed and well-nourished. No distress.  HENT:  Head: Normocephalic and atraumatic.  Right Ear: External ear normal. No lacerations. No drainage, swelling or tenderness. No foreign bodies. No mastoid tenderness. Tympanic membrane is not injected, not scarred, not perforated, not erythematous, not retracted and not bulging. No middle ear effusion. No hemotympanum. Decreased hearing is noted.  Left Ear: External ear normal. No lacerations. No drainage, swelling or tenderness. No foreign bodies. No mastoid tenderness. Tympanic membrane is not injected, not scarred, not perforated, not erythematous, not retracted and not bulging.  No middle ear effusion. No hemotympanum. Decreased hearing is noted.  Impacted cerumen, lavage, removed.   Eyes: Conjunctivae and EOM are normal. Pupils are equal, round, and reactive to light.  Neck: Neck supple.  Cardiovascular: Normal rate, regular rhythm and normal heart sounds.   Pulmonary/Chest: Effort normal and breath sounds normal. No respiratory  distress. She has no rales. She exhibits no tenderness.  Abdominal: Soft. Bowel sounds are normal. She exhibits no distension. There is no tenderness. There is no rebound and no guarding.  External hemorrhoidsx2-not inflamed; from previous exam.  Genitourinary: Rectal exam shows guaiac  negative stool.  Genitourinary Comments: Prior exam did not find hard stools in her rectal vault.   Musculoskeletal: She exhibits no edema (trace) or tenderness.       Left shoulder: She exhibits decreased range of motion, bony tenderness, swelling and pain. She exhibits no effusion, no crepitus, no deformity, no laceration, no spasm, normal pulse and normal strength.  C/o right knee pain with walking. No focal swelling, warmth, or deformity.   Neurological: She is alert and oriented to person, place, and time. She has normal reflexes. No cranial nerve deficit.  involuntary lip movements.   Skin: Skin is warm and dry.  Psychiatric: Her mood appears not anxious. Her affect is not angry, not blunt, not labile and not inappropriate. Her speech is not rapid and/or pressured, not delayed, not tangential and not slurred. She is slowed. She is not agitated, not hyperactive, not withdrawn, not actively hallucinating and not combative. Thought content is not paranoid and not delusional. Cognition and memory are impaired. She does not express impulsivity or inappropriate judgment. She exhibits a depressed mood. She expresses no homicidal and no suicidal ideation. She expresses no suicidal plans and no homicidal plans. She is communicative. She exhibits abnormal recent memory.  A little anxious/flat affect/slow in responding, no frank hallucinosis.  She is attentive.    Labs reviewed:  Recent Labs  02/14/16 07/26/16  NA 139 141  K 4.3 4.2  BUN 13 16  CREATININE 1.0 0.9    Recent Labs  02/14/16 07/26/16  AST 24 24  ALT 17 17  ALKPHOS 117 124    Recent Labs  02/14/16 07/26/16  WBC 6.9 7.6  HGB 12.1 12.1  HCT 36  36  PLT 320 306   Lab Results  Component Value Date   TSH 1.36 02/14/2016   Lab Results  Component Value Date   HGBA1C 6.0 02/14/2016   Lab Results  Component Value Date   CHOL 155 04/04/2013   HDL 53 04/04/2013   LDLCALC 80 04/04/2013   LDLDIRECT 190.0 03/24/2012   TRIG 108 04/04/2013   CHOLHDL 4 03/24/2012    Assessment/Plan  1. Essential hypertension The current medical regimen is effective;  continue present plan and medications.  2. Hyperlipidemia, unspecified hyperlipidemia type The current medical regimen is effective;  continue present plan and medications.  3. Irritable bowel syndrome with constipation The current medical regimen is effective;  continue present plan and medications.  4. Depression with anxiety The current medical regimen is effective;  continue present plan and medications.

## 2016-11-07 ENCOUNTER — Encounter: Payer: Self-pay | Admitting: Nurse Practitioner

## 2016-11-07 NOTE — Progress Notes (Signed)
Location:  [place] Room Number: [phone] Place of Service:  ALF [phone]) Provider:  Mast, Manxie  NP  [name], MD  Patient Care Team: Estill Dooms, MD as PCP - General (Internal Medicine) [name], MD (Ophthalmology) [name], MD (Psychiatry) Man Otho Darner, NP as Nurse Practitioner (Nurse Practitioner) [name], MD as Consulting Physician (Orthopedic Surgery)  Extended Emergency Contact Information Primary Emergency Contact: [name], [place] Work Phone: [phone] Relation: Son Secondary Emergency Contact: [name] [place] of [place] Phone: [phone] Relation: Sister  Code Status:  DNR Goals of care: Advanced Directive information Advanced Directives 11/07/2016  Does Patient Have a Medical Advance Directive? Yes  Type of Paramedic of [place];Living will;Out of facility DNR (pink MOST or yellow form)  Does patient want to make changes to medical advance directive? No - Patient declined  Copy of [place] in Chart? Yes  Pre-existing out of facility DNR order (yellow form or pink MOST form) Yellow form placed in chart (order not valid for inpatient use)     Chief Complaint  Patient presents with  . Medical Management of Chronic Issues    HPI:  Pt is a 76 y.o. female seen today for medical management of chronic diseases.     Past Medical History:  Diagnosis Date  . Allergy   . Anemia   . Anxiety   . Asthma   . CAD (coronary artery disease)   . Depression   . Diverticulosis   . Dysfunction of eustachian tube   . Elevated LFTs   . Esophageal reflux   . Esophageal stricture 2002  . Hemorrhoids   . History of rectal polyps   . HLD (hyperlipidemia)   . HTN (hypertension)   . Hypertonicity of bladder   . Irritable bowel syndrome with constipation   . Obesity   . Optic neuritis   . Osteoporosis   . Peptic ulcer  disease   . Peripheral neuropathy ([place])   . Trigeminal neuralgia    prior injury  . 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 Vitamin D deficiency    Past Surgical History:  Procedure Laterality Date  . ABDOMINAL HYSTERECTOMY    . ANAL RECTAL MANOMETRY N/A 07/05/2014   Procedure: ANO RECTAL MANOMETRY;  Surgeon: Leighton Ruff, MD;  Location: [facility];  Service: Endoscopy;  Laterality: N/A;  . APPENDECTOMY    . CAROTID ENDARTERECTOMY    . COLONOSCOPY  2008, 2015  . TONSILLECTOMY    . UPPER GASTROINTESTINAL ENDOSCOPY  2002    Allergies  Allergen Reactions  . Azithromycin   . Biaxin [Clarithromycin]   . Clarithromycin   . Doxycycline   . Fosamax [Alendronate Sodium]   . Geodon [Ziprasidone Hydrochloride]   . Lipitor [Atorvastatin Calcium]   . Penicillins   . Pravachol [Pravastatin Sodium]   . Statins   . Sulfonamide Derivatives   . Zocor [Simvastatin]     Allergies as of 11/07/2016      Reactions   Azithromycin    Biaxin [clarithromycin]    Clarithromycin    Doxycycline    Fosamax [alendronate Sodium]    Geodon [ziprasidone Hydrochloride]    Lipitor [atorvastatin Calcium]    Penicillins    Pravachol [pravastatin Sodium]    Statins    Sulfonamide Derivatives    Zocor [  simvastatin]       Medication List       Accurate as of 11/07/16 11:06 AM. Always use your most recent med list.          acetaminophen 325 MG tablet Commonly known as:  TYLENOL Take 650 mg by mouth every 6 (six) hours as needed. And take 2 tabs by mouth at bedtime   amitriptyline 50 MG tablet Commonly known as:  ELAVIL Take 50 mg by mouth at bedtime.   aspirin 81 MG EC tablet Take 81 mg by mouth daily.   bisacodyl 10 MG suppository Commonly known as:  DULCOLAX Place 10 mg rectally. Insert 1 supp rectally every day as needed for constipation   bisacodyl 5 MG EC tablet Generic drug:  bisacodyl Take 5 mg by mouth. Take one  tablet by mouth every day as needed for constipation   calcium-vitamin D 250-125 MG-UNIT tablet Commonly known as:  OSCAL WITH D Take 1 tablet by mouth 2 (two) times daily. Reported on 12/27/2015   carbamide peroxide 6.5 % otic solution Commonly known as:  DEBROX Place 5 drops into both ears 2 (two) times daily.   CENTRUM SILVER PO Take 1 tablet by mouth.   chlordiazePOXIDE 25 MG capsule Commonly known as:  LIBRIUM Take one capsule by mouth at bedtime for anxiety   cyclobenzaprine 5 MG tablet Commonly known as:  FLEXERIL Take 5 mg by mouth 3 (three) times daily as needed for muscle spasms.   famotidine 20 MG tablet Commonly known as:  PEPCID Take 20 mg by mouth at bedtime.   fluPHENAZine 1 MG tablet Commonly known as:  PROLIXIN Take 1 mg by mouth at bedtime.   fluticasone 50 MCG/ACT nasal spray Commonly known as:  FLONASE Place 1 spray into both nostrils 2 (two) times daily.   furosemide 20 MG tablet Commonly known as:  LASIX 1 by mouth every am diuretic for peripheral edema   HYDROcodone-acetaminophen 5-325 MG tablet Commonly known as:  NORCO/VICODIN Take one tablet by mouth twice daily for pain   hydrocortisone cream 1 % Apply 1 application topically 2 (two) times daily.   LINZESS 290 MCG Caps capsule Generic drug:  linaclotide Take 290 mcg by mouth daily.   LORazepam 0.5 MG tablet Commonly known as:  ATIVAN Take one tablet by mouth twice daily for anxiety   magnesium hydroxide 400 MG/5ML suspension Commonly known as:  MILK OF MAGNESIA Take by mouth. Take 30 ml by mouth every day as needed for constipation Take with prune juice at bedtime   mometasone 0.1 % cream Commonly known as:  ELOCON Apply 1 application topically 2 (two) times daily as needed. To face   MYLANTA 200-200-20 MG/5ML suspension Generic drug:  alum & mag hydroxide-simeth Take by mouth. Take 39ml every 4 hours as needed and at bedtime for indigestion   omeprazole 20 MG capsule Commonly  known as:  PRILOSEC Take 20 mg by mouth daily.   polyvinyl alcohol 1.4 % ophthalmic solution Commonly known as:  LIQUIFILM TEARS Place 1 drop into both eyes 3 (three) times daily.   ROBAFEN DM 10-100 MG/5ML liquid Generic drug:  Dextromethorphan-Guaifenesin Take 5 mLs by mouth every 6 (six) hours as needed. For cough x 48 hours   saccharomyces boulardii 250 MG capsule Commonly known as:  FLORASTOR Take 250 mg by mouth. Take one capsule once a day   sodium phosphate Pediatric 3.5-9.5 GM/59ML enema Place 1 enema rectally. Insert one dose every three days  Review of Systems  Immunization History  Administered Date(s) Administered  . Influenza Split 07/23/2013  . Influenza Whole 07/05/2009  . Influenza-Unspecified 07/29/2014, 06/14/2015, 07/11/2016  . PPD Test 06/17/2013  . Pneumococcal Polysaccharide-23 05/05/2009  . Td 05/05/2009  . Zoster 01/09/2011   Pertinent  Health Maintenance Due  Topic Date Due  . DEXA SCAN  07/12/2004  . PNA vac Low Risk Adult (2 of 2 - PCV13) 05/05/2010  . INFLUENZA VACCINE  Completed   Fall Risk  07/29/2014  Falls in the past year? No   Functional Status Survey:    Vitals:   11/07/16 1044  BP: 128/72  Pulse: 68  Resp: 18  Temp: 98 F (36.7 C)  Weight: 152 lb (68.9 kg)  Height: 5' 1.5" (1.562 m)   Body mass index is 28.26 kg/m. Physical Exam  Labs reviewed:  Recent Labs  02/14/16 07/26/16  NA 139 141  K 4.3 4.2  BUN 13 16  CREATININE 1.0 0.9    Recent Labs  02/14/16 07/26/16  AST 24 24  ALT 17 17  ALKPHOS 117 124    Recent Labs  02/14/16 07/26/16  WBC 6.9 7.6  HGB 12.1 12.1  HCT 36 36  PLT 320 306   Lab Results  Component Value Date   TSH 1.36 02/14/2016   Lab Results  Component Value Date   HGBA1C 6.0 02/14/2016   Lab Results  Component Value Date   CHOL 155 04/04/2013   HDL 53 04/04/2013   LDLCALC 80 04/04/2013   LDLDIRECT 190.0 03/24/2012   TRIG 108 04/04/2013   CHOLHDL 4 03/24/2012     Significant Diagnostic Results in last 30 days:  No results found.  Assessment/Plan There are no diagnoses linked to this encounter.   Family/ staff Communication:   Labs/tests ordered:      This encounter was created in error - please disregard.

## 2016-12-05 ENCOUNTER — Other Ambulatory Visit: Payer: Self-pay | Admitting: *Deleted

## 2016-12-05 MED ORDER — HYDROCODONE-ACETAMINOPHEN 5-325 MG PO TABS: ORAL_TABLET | ORAL | 0 refills | Status: DC

## 2016-12-05 NOTE — Telephone Encounter (Signed)
[facility] 

## 2016-12-19 ENCOUNTER — Encounter: Payer: Self-pay | Admitting: Nurse Practitioner

## 2016-12-19 ENCOUNTER — Non-Acute Institutional Stay: Payer: Medicare Other | Admitting: Nurse Practitioner

## 2016-12-19 DIAGNOSIS — M25561 Pain in right knee: Secondary | ICD-10-CM

## 2016-12-19 DIAGNOSIS — R609 Edema, unspecified: Secondary | ICD-10-CM | POA: Diagnosis not present

## 2016-12-19 DIAGNOSIS — K219 Gastro-esophageal reflux disease without esophagitis: Secondary | ICD-10-CM | POA: Diagnosis not present

## 2016-12-19 DIAGNOSIS — N318 Other neuromuscular dysfunction of bladder: Secondary | ICD-10-CM | POA: Diagnosis not present

## 2016-12-19 DIAGNOSIS — F411 Generalized anxiety disorder: Secondary | ICD-10-CM

## 2016-12-19 DIAGNOSIS — I10 Essential (primary) hypertension: Secondary | ICD-10-CM

## 2016-12-19 DIAGNOSIS — K5909 Other constipation: Secondary | ICD-10-CM | POA: Diagnosis not present

## 2016-12-19 DIAGNOSIS — F418 Other specified anxiety disorders: Secondary | ICD-10-CM

## 2016-12-19 NOTE — Assessment & Plan Note (Signed)
Stable, continue Lorazepam 05mg  bid,  Amitriptyline 50mg  hs, Librium 25mg  prn, and Prolixin 1mg  hs.  Psych hospitalization x2 in the past.

## 2016-12-19 NOTE — Assessment & Plan Note (Signed)
Continue Linzess, MOM, prn Bisacodyl po/PR, prn enema.   Chronic condition, has been managed by GI

## 2016-12-19 NOTE — Assessment & Plan Note (Signed)
Blood pressure is controlled, continue Furosemide 20mg 

## 2016-12-19 NOTE — Assessment & Plan Note (Signed)
Continue Tylenol hs and prn, Norco bid,  Flexeril prn

## 2016-12-19 NOTE — Progress Notes (Signed)
Location:  [facility] Room Number: [id number] Place of Service:  ALF [phone]) Provider: Marlana Latus  NP  Patient Care Team: Estill Dooms, MD as PCP - General (Internal Medicine) [name], MD (Ophthalmology) [name], MD (Psychiatry) Jasmon Graffam Otho Darner, NP as Nurse Practitioner (Nurse Practitioner) [name], MD as Consulting Physician (Orthopedic Surgery)  Extended Emergency Contact Information Primary Emergency Contact: [name], [place] Work Phone: [phone] Relation: Son Secondary Emergency Contact: [name] [place] Phone: [phone] Relation: Sister  Code Status: DNR Goals of Care: Advanced Directive information Advanced Directives 12/19/2016  Does Patient Have a Medical Advance Directive? Yes  Type of Paramedic of [place];Living will;Out of facility DNR (pink MOST or yellow form)  Does patient want to make changes to medical advance directive? No - Patient declined  Copy of [place] in Chart? Yes  Pre-existing out of facility DNR order (yellow form or pink MOST form) Yellow form placed in chart (order not valid for inpatient use)     Chief Complaint  Patient presents with  . Medical Management of Chronic Issues    HPI: Patient is a 76 y.o. female seen in today for evaluation of chronic medical conditions.     Hx of constipation, has been stable, f/u GI, Hx of GERD, stable while on Famotidine and Omeprazole, anxiety, depression, mood has been stable while on Amitriptyline 50mg , Librium 25mg  prn, Fluphenazine 1mg  hs, Lorazepam bid, muscle and joints aches pains, R knee pain, pain is managed with Norco bid, Tylenol hs and prn, Flexeril prn HTN, controlled, chronic ankle edema is minimal while taking Furosemide 20mg , Bun/creat 16/0.9 07/26/16 Depression screen PHQ 2/9 07/29/2014  Decreased Interest 0  Down, Depressed, Hopeless  0  PHQ - 2 Score 0    Fall Risk  07/29/2014  Falls in the past year? No   No flowsheet data found.   Health Maintenance  Topic Date Due  . DEXA SCAN  07/12/2004  . PNA vac Low Risk Adult (2 of 2 - PCV13) 05/05/2010  . TETANUS/TDAP  05/06/2019  . INFLUENZA VACCINE  Completed    Urinary incontinence? Functional Status Survey:   Exercise? Diet? No exam data present Hearing:   Dentition: Pain:  Past Medical History:  Diagnosis Date  . Allergy   . Anemia   . Anxiety   . Asthma   . CAD (coronary artery disease)   . Depression   . Diverticulosis   . Dysfunction of eustachian tube   . Elevated LFTs   . Esophageal reflux   . Esophageal stricture 2002  . Hemorrhoids   . History of rectal polyps   . HLD (hyperlipidemia)   . HTN (hypertension)   . Hypertonicity of bladder   . Irritable bowel syndrome with constipation   . Obesity   . Optic neuritis   . Osteoporosis   . Peptic ulcer disease   . Peripheral neuropathy ([place])   . Trigeminal neuralgia    prior injury  . 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 Vitamin D deficiency     Past Surgical History:  Procedure Laterality Date  . ABDOMINAL HYSTERECTOMY    . ANAL RECTAL MANOMETRY N/A 07/05/2014   Procedure: ANO RECTAL MANOMETRY;  Surgeon: Leighton Ruff, MD;  Location: [facility];  Service: Endoscopy;  Laterality: N/A;  .  APPENDECTOMY    . CAROTID ENDARTERECTOMY    . COLONOSCOPY  2008, 2015  . TONSILLECTOMY    . UPPER GASTROINTESTINAL ENDOSCOPY  2002    Family History  Problem Relation Age of Onset  . Alzheimer's disease Mother   . Coronary artery disease Father   . Heart attack Father   . Heart disease Father   . Anuerysm Father     AAA  . Ovarian cancer Sister   . Lung cancer Brother   . Heart disease Brother   . Anuerysm Brother     AAA  . Diabetes Neg Hx   . Colon cancer Neg Hx     Social History   Social History  . Marital  status: Divorced    Spouse name: N/A  . Number of children: 2  . Years of education: N/A   Occupational History  . retired Retired   Social History Main Topics  . Smoking status: Never Smoker  . Smokeless tobacco: Never Used  . Alcohol use No  . Drug use: No  . Sexual activity: No   Other Topics Concern  . None   Social History Narrative   HSG, business college   Married 59-63yrs/divorced   2 son-'60; 2 granddaughters   Work: Network engineer, travel clerk   [place] home in a studio apartment, moved to [place] 04/2013   Patient never smoked. Did have second hand smoke exposure childhood and work   Alcohol none   Walks with walker    reports that she has never smoked. She has never used smokeless tobacco. She reports that she does not drink alcohol or use drugs.   Allergies  Allergen Reactions  . Azithromycin   . Biaxin [Clarithromycin]   . Clarithromycin   . Doxycycline   . Fosamax [Alendronate Sodium]   . Geodon [Ziprasidone Hydrochloride]   . Lipitor [Atorvastatin Calcium]   . Penicillins   . Pravachol [Pravastatin Sodium]   . Statins   . Sulfonamide Derivatives   . Zocor [Simvastatin]     Allergies as of 12/19/2016      Reactions   Azithromycin    Biaxin [clarithromycin]    Clarithromycin    Doxycycline    Fosamax [alendronate Sodium]    Geodon [ziprasidone Hydrochloride]    Lipitor [atorvastatin Calcium]    Penicillins    Pravachol [pravastatin Sodium]    Statins    Sulfonamide Derivatives    Zocor [simvastatin]       Medication List       Accurate as of 12/19/16  2:17 PM. Always use your most recent med list.          acetaminophen 325 MG tablet Commonly known as:  TYLENOL Take 650 mg by mouth every 6 (six) hours as needed. And take 2 tabs by mouth at bedtime   amitriptyline 50 MG tablet Commonly known as:  ELAVIL Take 50 mg by mouth at bedtime.   aspirin 81 MG EC tablet Take 81 mg by mouth daily.   bisacodyl 10 MG suppository Commonly  known as:  DULCOLAX Place 10 mg rectally. Insert 1 supp rectally every day as needed for constipation   bisacodyl 5 MG EC tablet Generic drug:  bisacodyl Take 5 mg by mouth. Take one tablet by mouth every day as needed for constipation   calcium-vitamin D 250-125 MG-UNIT tablet Commonly known as:  OSCAL WITH D Take 1 tablet by mouth 2 (two) times daily. Reported on 12/27/2015   carbamide peroxide 6.5 % otic solution  Commonly known as:  DEBROX Place 5 drops into both ears 2 (two) times daily.   CENTRUM SILVER PO Take 1 tablet by mouth.   chlordiazePOXIDE 25 MG capsule Commonly known as:  LIBRIUM Take one capsule by mouth at bedtime for anxiety   cyclobenzaprine 5 MG tablet Commonly known as:  FLEXERIL Take 5 mg by mouth 3 (three) times daily as needed for muscle spasms.   famotidine 20 MG tablet Commonly known as:  PEPCID Take 20 mg by mouth at bedtime.   fluPHENAZine 1 MG tablet Commonly known as:  PROLIXIN Take 1 mg by mouth at bedtime.   fluticasone 50 MCG/ACT nasal spray Commonly known as:  FLONASE Place 1 spray into both nostrils 2 (two) times daily.   furosemide 20 MG tablet Commonly known as:  LASIX 1 by mouth every am diuretic for peripheral edema   HYDROcodone-acetaminophen 5-325 MG tablet Commonly known as:  NORCO/VICODIN Take one tablet by mouth twice daily for pain   hydrocortisone cream 1 % Apply 1 application topically 2 (two) times daily.   LINZESS 290 MCG Caps capsule Generic drug:  linaclotide Take 290 mcg by mouth daily.   LORazepam 0.5 MG tablet Commonly known as:  ATIVAN Take one tablet by mouth twice daily for anxiety   magnesium hydroxide 400 MG/5ML suspension Commonly known as:  MILK OF MAGNESIA Take by mouth. Take 30 ml by mouth every day as needed for constipation Take with prune juice at bedtime   mometasone 0.1 % cream Commonly known as:  ELOCON Apply 1 application topically 2 (two) times daily as needed. To face   MYLANTA  200-200-20 MG/5ML suspension Generic drug:  alum & mag hydroxide-simeth Take by mouth. Take 55ml every 4 hours as needed and at bedtime for indigestion   omeprazole 20 MG capsule Commonly known as:  PRILOSEC Take 20 mg by mouth daily.   polyvinyl alcohol 1.4 % ophthalmic solution Commonly known as:  LIQUIFILM TEARS Place 1 drop into both eyes 3 (three) times daily.   ROBAFEN DM 10-100 MG/5ML liquid Generic drug:  Dextromethorphan-Guaifenesin Take 5 mLs by mouth every 6 (six) hours as needed. For cough x 48 hours   saccharomyces boulardii 250 MG capsule Commonly known as:  FLORASTOR Take 250 mg by mouth. Take one capsule once a day   sodium phosphate Pediatric 3.5-9.5 GM/59ML enema Place 1 enema rectally. Insert one dose every three days        Review of Systems:  Review of Systems  Constitutional: Negative for chills, diaphoresis and fever.  HENT: Positive for hearing loss and tinnitus. Negative for congestion, ear discharge, ear pain and nosebleeds.   Eyes: Negative for photophobia, pain and discharge.       Legally blind.   Respiratory: Negative for cough, shortness of breath and wheezing.   Cardiovascular: Negative for chest pain, palpitations and leg swelling.  Gastrointestinal: Positive for constipation. Negative for abdominal pain, diarrhea, nausea and vomiting.       C/o abd pain on and off. Not upon today's visit.   Genitourinary: Positive for frequency (adult depends). Negative for dysuria, flank pain and urgency.       Pressure in her bladder area.   Musculoskeletal: Negative for back pain, myalgias and neck pain.       C/o chronic right knee pain of variable intensity, still able to ambulate with walker as usual.   Skin: Negative for rash.  Neurological: Positive for tremors (diffused rigidity. ). Negative for dizziness, seizures, weakness (generalized)  and headaches.       Lips smacking  Psychiatric/Behavioral: Negative for suicidal ideas. The patient is  nervous/anxious.        Hx of delusion, well controlled. Stated she wakes up a few times at night, but no difficulty return to sleep.     Physical Exam: Vitals:   12/19/16 1238  BP: 138/66  Pulse: 70  Resp: 18  Temp: 97.3 F (36.3 C)  Weight: 153 lb (69.4 kg)  Height: 5' 1.5" (1.562 m)   Body mass index is 28.44 kg/m. Physical Exam  Constitutional: She is oriented to person, place, and time. She appears well-developed and well-nourished. No distress.  HENT:  Head: Normocephalic and atraumatic.  Right Ear: External ear normal. No lacerations. No drainage, swelling or tenderness. No foreign bodies. No mastoid tenderness. Tympanic membrane is not injected, not scarred, not perforated, not erythematous, not retracted and not bulging. No middle ear effusion. No hemotympanum. Decreased hearing is noted.  Left Ear: External ear normal. No lacerations. No drainage, swelling or tenderness. No foreign bodies. No mastoid tenderness. Tympanic membrane is not injected, not scarred, not perforated, not erythematous, not retracted and not bulging.  No middle ear effusion. No hemotympanum. Decreased hearing is noted.  Impacted cerumen, lavage, removed.   Eyes: Conjunctivae and EOM are normal. Pupils are equal, round, and reactive to light.  Neck: Neck supple.  Cardiovascular: Normal rate, regular rhythm and normal heart sounds.   Pulmonary/Chest: Effort normal and breath sounds normal. No respiratory distress. She has no rales. She exhibits no tenderness.  Abdominal: Soft. Bowel sounds are normal. She exhibits no distension. There is no tenderness. There is no rebound and no guarding.  External hemorrhoidsx2-not inflamed; from previous exam.  Genitourinary: Rectal exam shows guaiac negative stool.  Genitourinary Comments: Prior exam did not find hard stools in her rectal vault.   Musculoskeletal: She exhibits no edema (trace) or tenderness.       Left shoulder: She exhibits decreased range of  motion, bony tenderness, swelling and pain. She exhibits no effusion, no crepitus, no deformity, no laceration, no spasm, normal pulse and normal strength.  C/o right knee pain with walking. No focal swelling, warmth, or deformity.   Neurological: She is alert and oriented to person, place, and time. She has normal reflexes. No cranial nerve deficit.  involuntary lip movements.   Skin: Skin is warm and dry.  Psychiatric: Her mood appears not anxious. Her affect is not angry, not blunt, not labile and not inappropriate. Her speech is not rapid and/or pressured, not delayed, not tangential and not slurred. She is slowed. She is not agitated, not hyperactive, not withdrawn, not actively hallucinating and not combative. Thought content is not paranoid and not delusional. Cognition and memory are impaired. She does not express impulsivity or inappropriate judgment. She exhibits a depressed mood. She expresses no homicidal and no suicidal ideation. She expresses no suicidal plans and no homicidal plans. She is communicative. She exhibits abnormal recent memory.  A little anxious/flat affect/slow in responding, no frank hallucinosis.  She is attentive.    Labs reviewed: Basic Metabolic Panel:  Recent Labs  02/14/16 07/26/16  NA 139 141  K 4.3 4.2  BUN 13 16  CREATININE 1.0 0.9  TSH 1.36  --    Liver Function Tests:  Recent Labs  02/14/16 07/26/16  AST 24 24  ALT 17 17  ALKPHOS 117 124   No results for input(s): LIPASE, AMYLASE in the last 8760 hours. No results for  input(s): AMMONIA in the last 8760 hours. CBC:  Recent Labs  02/14/16 07/26/16  WBC 6.9 7.6  HGB 12.1 12.1  HCT 36 36  PLT 320 306   Lipid Panel: No results for input(s): CHOL, HDL, LDLCALC, TRIG, CHOLHDL, LDLDIRECT in the last 8760 hours. Lab Results  Component Value Date   HGBA1C 6.0 02/14/2016    Procedures: No results found.  Assessment/Plan Essential hypertension Blood pressure is controlled, continue  Furosemide 20mg   GERD (gastroesophageal reflux disease) Stable, continue Famotidine 20mg , Omeprazole 20mg , Mylanta qhs and prn  Constipation Continue Linzess, MOM, prn Bisacodyl po/PR, prn enema.   Chronic condition, has been managed by GI   Hypertonicity of bladder Adult depends for urine leakage  Depression with anxiety Stable, continue Lorazepam 05mg  bid,  Amitriptyline 50mg  hs, Librium 25mg  prn, and Prolixin 1mg  hs.  Psych hospitalization x2 in the past.   PERIPHERAL EDEMA Only trace edema seen in ankles, continue Furosemide 20mg  daily.   Right knee pain Continue Tylenol hs and prn, Norco bid,  Flexeril prn  Anxiety state f/u psychiatry regularly, takes Lorazepam 05mg  bid,  Amitriptyline 50mg  hs, Librium 25mg  prn, and Prolixin 1mg  hs.      Labs/tests ordered: none Next appt:  prn

## 2016-12-19 NOTE — Assessment & Plan Note (Signed)
Stable, continue Famotidine 20mg , Omeprazole 20mg , Mylanta qhs and prn

## 2016-12-19 NOTE — Assessment & Plan Note (Signed)
f/u psychiatry regularly, takes Lorazepam 05mg  bid,  Amitriptyline 50mg  hs, Librium 25mg  prn, and Prolixin 1mg  hs.

## 2016-12-19 NOTE — Assessment & Plan Note (Signed)
Adult depends for urine leakage

## 2016-12-19 NOTE — Assessment & Plan Note (Signed)
Only trace edema seen in ankles, continue Furosemide 20mg  daily.

## 2016-12-20 ENCOUNTER — Encounter: Payer: Self-pay | Admitting: Internal Medicine

## 2016-12-20 ENCOUNTER — Other Ambulatory Visit (INDEPENDENT_AMBULATORY_CARE_PROVIDER_SITE_OTHER): Payer: Medicare Other

## 2016-12-20 ENCOUNTER — Ambulatory Visit (INDEPENDENT_AMBULATORY_CARE_PROVIDER_SITE_OTHER): Payer: Medicare Other | Admitting: Internal Medicine

## 2016-12-20 VITALS — BP 140/94 | HR 84 | Ht 59.5 in | Wt 149.2 lb

## 2016-12-20 DIAGNOSIS — K581 Irritable bowel syndrome with constipation: Secondary | ICD-10-CM | POA: Diagnosis not present

## 2016-12-20 DIAGNOSIS — R1032 Left lower quadrant pain: Secondary | ICD-10-CM

## 2016-12-20 DIAGNOSIS — R10814 Left lower quadrant abdominal tenderness: Secondary | ICD-10-CM

## 2016-12-20 LAB — CREATININE, SERUM: Creatinine, Ser: 1.01 mg/dL (ref 0.40–1.20)

## 2016-12-20 LAB — BUN: BUN: 15 mg/dL (ref 6–23)

## 2016-12-20 NOTE — Progress Notes (Signed)
   Jean [name] 76 y.o. 1941-04-15 [id number]  Assessment & Plan:   Encounter Diagnoses  Name Primary?  Marland Kitchen LLQ pain Yes  . Left lower quadrant abdominal tenderness without rebound tenderness   . Irritable bowel syndrome with constipation    I think this is probably all her IBS again but she insists things are worse since she does have some focal tenderness that could represent diverticulitis. I am going to evaluate with a CT of the abdomen and pelvis. Further plans pending that.  I appreciate the opportunity to care for this patient. CC: Jean Hubert, MD    Subjective:   Chief Complaint:Bloating, abdominal pain  HPI Jean [name] is here complaining of intensification of lower abdominal pain especially in the left lower quadrant, bloating gas and pressure that actually says it pushes on her bladder. While she's had chronic symptoms like this she swears it's worse. She has tried to lose weight to see if that would help but it doesn't. She is asking for an MRI to find out what is going on. As a liquid stool every morning using Linzess Wt Readings from Last 3 Encounters:  12/20/16 149 lb 4 oz (67.7 kg)  12/19/16 153 lb (69.4 kg)  11/07/16 152 lb (68.9 kg)   Medications, allergies, past medical history, past surgical history, family history and social history are reviewed and updated in the EMR.  Review of Systems As above  Objective:   Physical Exam BP (!) 140/94 (BP Location: Right Arm, Patient Position: Sitting, Cuff Size: Normal)   Pulse 84   Ht 4' 11.5" (1.511 m) Comment: height measured without shoes  Wt 149 lb 4 oz (67.7 kg)   BMI 29.64 kg/m  Mildly chronically ill elderly no acute distress Eyes anicteric Abdomen is mildly distended, narrative is a ventral hernia below the umbilicus, soft nontender there but she has some focal left lower quadrant tenderness, I'll suprapubic tenderness as well. No rebound. She appears mildly anxious but hasn't otherwise appropriate  affect alert and oriented Gait is slow require some assistance she generally uses a walker  I reviewed primary care note from yesterday, I reviewed labs in the computer from late 2017 Gen. normal CBC and chemistries then lastly 2015 diverticulosis hemorrhoids

## 2016-12-20 NOTE — Patient Instructions (Addendum)
Your physician has requested that you go to the basement for the lab work before leaving today.     You have been scheduled for a CT scan of the abdomen and pelvis at [facility] ([address]---this is in the same building as Press [facility]).   You are scheduled on 12/31/16 at 1:00pm. You should arrive 15 minutes prior to your appointment time for registration. Please follow the written instructions below on the day of your exam:  WARNING: IF YOU ARE ALLERGIC TO IODINE/X-RAY DYE, PLEASE NOTIFY RADIOLOGY IMMEDIATELY AT [phone]! YOU WILL BE GIVEN A 13 HOUR PREMEDICATION PREP.  1) Do not eat or drink anything after 9:00AM (4 hours prior to your test) 2) You have been given 2 bottles of oral contrast to drink. The solution may taste   better if refrigerated, but do NOT add ice or any other liquid to this solution. Shake well before drinking.    Drink 1 bottle of contrast @ 11:00am (2 hours prior to your exam)  Drink 1 bottle of contrast @ 12:00pm (1 hour prior to your exam)  You may take any medications as prescribed with a small amount of water except for the following: Metformin, Glucophage, Glucovance, Avandamet, Riomet, Fortamet, Actoplus Met, Janumet, Glumetza or Metaglip. The above medications must be held the day of the exam AND 48 hours after the exam.  The purpose of you drinking the oral contrast is to aid in the visualization of your intestinal tract. The contrast solution may cause some diarrhea. Before your exam is started, you will be given a small amount of fluid to drink. Depending on your individual set of symptoms, you may also receive an intravenous injection of x-ray contrast/dye. Plan on being at [facility] for 30 minutes or longer, depending on the type of exam you are having performed.  This test typically takes 30-45 minutes to complete.  If you have any questions regarding your exam or if you need to reschedule, you may call the CT department  at [phone] between the hours of 8:00 am and 5:00 pm, Monday-Friday.  ________________________________________________________________________    I appreciate the opportunity to care for you. Silvano Rusk, MD, [facility]

## 2016-12-28 ENCOUNTER — Other Ambulatory Visit: Payer: Medicare Other

## 2016-12-31 ENCOUNTER — Ambulatory Visit (INDEPENDENT_AMBULATORY_CARE_PROVIDER_SITE_OTHER)
Admission: RE | Admit: 2016-12-31 | Discharge: 2016-12-31 | Disposition: A | Discharge status: Home / Self Care | Payer: Medicare Other | Source: Ambulatory Visit | Attending: Internal Medicine | Admitting: Internal Medicine

## 2016-12-31 DIAGNOSIS — R10814 Left lower quadrant abdominal tenderness: Secondary | ICD-10-CM

## 2016-12-31 DIAGNOSIS — R1032 Left lower quadrant pain: Secondary | ICD-10-CM

## 2016-12-31 MED ORDER — IOPAMIDOL (ISOVUE-300) INJECTION 61%
100.0000 mL | Freq: Once | INTRAVENOUS | Status: AC | PRN
  Administered 2016-12-31: 100 mL via INTRAVENOUS

## 2017-01-01 ENCOUNTER — Encounter: Payer: Self-pay | Admitting: Internal Medicine

## 2017-01-01 NOTE — Progress Notes (Signed)
OK Please refer her to Dr. Bjorn Loser at [facility] urology re: pelvic floor laxity/weakness also has exophytic kidney lesion - small and probably benign but please weigh in I spoke to Dr. Matilde Sprang and he thinks might be able to offer non-operative Tx Am ccing Dr. Elder Negus her PCP

## 2017-01-01 NOTE — Progress Notes (Signed)
CT shows extreme weakness and dropping of pelvic floor. There is a small lesion, cyst most likely on the kidney that is slightly bigger than it was in 2007 and very unlikely to be anything bad. We or PCP can consider f/u imaging 1 year.   I think surgery would be necessary to fix this pelvic floor issue but she may not be a candidate. Ask her if she has seen urology in past - I thought she had but cannot find notes.

## 2017-01-02 ENCOUNTER — Telehealth: Payer: Self-pay | Admitting: Internal Medicine

## 2017-01-02 NOTE — Telephone Encounter (Signed)
See CT results for details

## 2017-01-07 ENCOUNTER — Other Ambulatory Visit: Payer: Self-pay | Admitting: *Deleted

## 2017-01-07 MED ORDER — HYDROCODONE-ACETAMINOPHEN 5-325 MG PO TABS: ORAL_TABLET | ORAL | 0 refills | Status: DC

## 2017-01-07 NOTE — Telephone Encounter (Signed)
[facility] #[phone] Fax: [phone]  

## 2017-01-08 ENCOUNTER — Other Ambulatory Visit: Payer: Self-pay | Admitting: *Deleted

## 2017-01-08 MED ORDER — HYDROCODONE-ACETAMINOPHEN 5-325 MG PO TABS: ORAL_TABLET | ORAL | 0 refills | Status: DC

## 2017-01-08 NOTE — Telephone Encounter (Signed)
[facility] #[phone] Fax: [phone]  

## 2017-01-21 ENCOUNTER — Encounter: Payer: Self-pay | Admitting: Internal Medicine

## 2017-01-21 ENCOUNTER — Non-Acute Institutional Stay: Payer: Medicare Other | Admitting: Internal Medicine

## 2017-01-21 DIAGNOSIS — I10 Essential (primary) hypertension: Secondary | ICD-10-CM | POA: Diagnosis not present

## 2017-01-21 DIAGNOSIS — K581 Irritable bowel syndrome with constipation: Secondary | ICD-10-CM | POA: Diagnosis not present

## 2017-01-21 DIAGNOSIS — R609 Edema, unspecified: Secondary | ICD-10-CM

## 2017-01-21 DIAGNOSIS — M25561 Pain in right knee: Secondary | ICD-10-CM | POA: Diagnosis not present

## 2017-01-21 DIAGNOSIS — K219 Gastro-esophageal reflux disease without esophagitis: Secondary | ICD-10-CM | POA: Diagnosis not present

## 2017-01-21 NOTE — Progress Notes (Signed)
Progress Note    Location:  [facility] Room Number: [phone] Place of Service:  ALF [phone]) Provider:  [name], MD  Patient Care Team: Estill Dooms, MD as PCP - General (Internal Medicine) [name], MD (Ophthalmology) [name], MD (Psychiatry) Man Otho Darner, NP as Nurse Practitioner (Nurse Practitioner) [name], MD as Consulting Physician (Orthopedic Surgery)  Extended Emergency Contact Information Primary Emergency Contact: [name], [place] of [place] Work Phone: [phone] Relation: Son Secondary Emergency Contact: [name] [place] of [place] Phone: [phone] Relation: Sister  Code Status:  DNR Goals of care: Advanced Directive information Advanced Directives 01/21/2017  Does Patient Have a Medical Advance Directive? Yes  Type of Paramedic of [place];Living will;Out of facility DNR (pink MOST or yellow form)  Does patient want to make changes to medical advance directive? -  Copy of Advance in Chart? Yes  Pre-existing out of facility DNR order (yellow form or pink MOST form) Yellow form placed in chart (order not valid for inpatient use)     Chief Complaint  Patient presents with  . Medical Management of Chronic Issues    routine visit    HPI:  Pt is a 76 y.o. female seen today for medical management of chronic diseases.    Essential hypertension - controlled  Gastroesophageal reflux disease without esophagitis - denies symptoms  Irritable bowel syndrome with constipation - no recent problems with abd discomfort, pain, constipoation, or diarrhea.  Edema, unspecified type - improved  Right knee pain, unspecified chronicity - unchanged. Limits walking.     Past Medical History:  Diagnosis Date  . Allergy   . Anemia   . Anxiety   . Asthma   . CAD (coronary artery disease)   . Depression   . Diverticulosis   .  Dysfunction of eustachian tube   . Elevated LFTs   . Esophageal reflux   . Esophageal stricture 2002  . Hemorrhoids   . History of rectal polyps   . HLD (hyperlipidemia)   . HTN (hypertension)   . Hypertonicity of bladder   . Irritable bowel syndrome with constipation   . Obesity   . Optic neuritis   . Osteoporosis   . Peptic ulcer disease   . Peripheral neuropathy   . Trigeminal neuralgia    prior injury  . 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 Vitamin D deficiency    Past Surgical History:  Procedure Laterality Date  . ABDOMINAL HYSTERECTOMY    . ANAL RECTAL MANOMETRY N/A 07/05/2014   Procedure: ANO RECTAL MANOMETRY;  Surgeon: Leighton Ruff, MD;  Location: [facility];  Service: Endoscopy;  Laterality: N/A;  . APPENDECTOMY    . CAROTID ENDARTERECTOMY    . COLONOSCOPY  2008, 2015  . TONSILLECTOMY    . UPPER GASTROINTESTINAL ENDOSCOPY  2002    Allergies  Allergen Reactions  . Azithromycin   . Biaxin [Clarithromycin]   . Clarithromycin   . Doxycycline   . Fosamax [Alendronate Sodium]   . Geodon [Ziprasidone Hydrochloride]   . Lipitor [Atorvastatin Calcium]   . Penicillins   . Pravachol [Pravastatin Sodium]   . Statins   . Sulfonamide Derivatives   . Zocor [Simvastatin]     Outpatient Encounter Prescriptions as of 01/21/2017  Medication Sig  . acetaminophen (TYLENOL) 325 MG tablet  Take 650 mg by mouth every 6 (six) hours as needed. And take 2 tabs by mouth at bedtime  . acetaminophen (TYLENOL) 650 MG CR tablet Take 650 mg by mouth at bedtime as needed for pain.  Marland Kitchen alum & mag hydroxide-simeth (MYLANTA) 200-200-20 MG/5ML suspension Take by mouth. Take 85ml every 4 hours as needed and at bedtime for indigestion  . amitriptyline (ELAVIL) 50 MG tablet Take 50 mg by mouth at bedtime.  Marland Kitchen aspirin 81 MG EC tablet Take 81 mg by mouth daily.    . bisacodyl (BISACODYL) 5 MG EC tablet Take 5 mg by mouth. Take one  tablet by mouth every day as needed for constipation  . bisacodyl (DULCOLAX) 10 MG suppository Place 10 mg rectally. Insert 1 supp rectally every day as needed for constipation  . calcium-vitamin D (OSCAL WITH D) 250-125 MG-UNIT tablet Take 1 tablet by mouth 2 (two) times daily. Reported on 12/27/2015  . carbamide peroxide (DEBROX) 6.5 % otic solution Place 5 drops into both ears. Once a day on Wednesday  . chlordiazePOXIDE (LIBRIUM) 25 MG capsule Take 25 mg by mouth. Take one capsule daily as needed for anxiety; take one capsule at bedtime  . cyclobenzaprine (FLEXERIL) 5 MG tablet Take 5 mg by mouth 3 (three) times daily as needed for muscle spasms.  . famotidine (PEPCID) 20 MG tablet Take 20 mg by mouth at bedtime.  . fluPHENAZine (PROLIXIN) 1 MG tablet Take 1 mg by mouth at bedtime.  . fluticasone (FLONASE) 50 MCG/ACT nasal spray Place 1 spray into both nostrils 2 (two) times daily.  . furosemide (LASIX) 20 MG tablet 1 by mouth every am diuretic for peripheral edema  . HYDROcodone-acetaminophen (NORCO/VICODIN) 5-325 MG tablet Take one tablet by mouth twice daily for pain  . hydrocortisone cream 1 % Apply 1 application topically 2 (two) times daily.  . Linaclotide (LINZESS) 290 MCG CAPS capsule Take 290 mcg by mouth daily.  Marland Kitchen LORazepam (ATIVAN) 0.5 MG tablet Take one tablet by mouth twice daily for anxiety  . magnesium hydroxide (MILK OF MAGNESIA) 400 MG/5ML suspension Take by mouth. Take 30 ml by mouth every day as needed for constipation Take with prune juice at bedtime  . mometasone (ELOCON) 0.1 % cream Apply 1 application topically 2 (two) times daily as needed. To face  . Multiple Vitamins-Minerals (CENTRUM SILVER PO) Take 1 tablet by mouth.    Marland Kitchen omeprazole (PRILOSEC) 20 MG capsule Take 20 mg by mouth daily.  . polyvinyl alcohol (LIQUIFILM TEARS) 1.4 % ophthalmic solution Place 1 drop into both eyes 3 (three) times daily.  Marland Kitchen saccharomyces boulardii (FLORASTOR) 250 MG capsule Take 250 mg by  mouth. Take one capsule once a day  . sodium phosphate Pediatric (FLEET) 3.5-9.5 GM/59ML enema Place 1 enema rectally. Insert one dose every three days  . [DISCONTINUED] chlordiazePOXIDE (LIBRIUM) 25 MG capsule Take one capsule by mouth at bedtime for anxiety (Patient taking differently: Take one capsule by mouth at bedtime for anxiety. Tak one capsule daily as needed for anxiety)  . [DISCONTINUED] Dextromethorphan-Guaifenesin (ROBAFEN DM) 10-100 MG/5ML liquid Take 5 mLs by mouth every 6 (six) hours as needed. For cough x 48 hours   No facility-administered encounter medications on file as of 01/21/2017.     Review of Systems  Constitutional: Negative for chills, diaphoresis and fever.  HENT: Positive for hearing loss and tinnitus. Negative for congestion, ear discharge, ear pain and nosebleeds.   Eyes: Negative for photophobia, pain and discharge.  Legally blind.   Respiratory: Negative for cough, shortness of breath and wheezing.   Cardiovascular: Negative for chest pain, palpitations and leg swelling.  Gastrointestinal: Negative for abdominal pain, constipation, diarrhea, nausea and vomiting.       C/o abd pain on and off. Not upon today's visit.   Genitourinary: Positive for frequency (adult depends). Negative for dysuria, flank pain and urgency.       Pressure in her bladder area.   Musculoskeletal: Negative for back pain, myalgias and neck pain.       C/o chronic right knee pain of variable intensity, still able to ambulate with walker as usual.   Skin: Negative for rash.  Neurological: Positive for tremors (diffused rigidity. ). Negative for dizziness, seizures, weakness (generalized) and headaches.       Lips smacking  Psychiatric/Behavioral: Negative for suicidal ideas. The patient is nervous/anxious.        Hx of delusion, well controlled. Stated she wakes up a few times at night, but no difficulty return to sleep.     Immunization History  Administered Date(s) Administered   . Influenza Split 07/23/2013  . Influenza Whole 07/05/2009  . Influenza-Unspecified 07/29/2014, 06/14/2015, 07/11/2016  . PPD Test 06/17/2013  . Pneumococcal Polysaccharide-23 05/05/2009  . Td 05/05/2009  . Zoster 01/09/2011   Pertinent  Health Maintenance Due  Topic Date Due  . DEXA SCAN  07/12/2004  . PNA vac Low Risk Adult (2 of 2 - PCV13) 05/05/2010  . INFLUENZA VACCINE  04/24/2017   Fall Risk  07/29/2014  Falls in the past year? No   Functional Status Survey:    Vitals:   01/21/17 1639  BP: (!) 118/56  Pulse: 64  Resp: 16  Temp: 97.2 F (36.2 C)  Weight: 148 lb 12.8 oz (67.5 kg)  Height: 5' 1.5" (1.562 m)   Body mass index is 27.66 kg/m. Physical Exam  Constitutional: She is oriented to person, place, and time. She appears well-developed and well-nourished. No distress.  HENT:  Head: Normocephalic and atraumatic.  Right Ear: External ear normal. No lacerations. No drainage, swelling or tenderness. No foreign bodies. No mastoid tenderness. Tympanic membrane is not injected, not scarred, not perforated, not erythematous, not retracted and not bulging. No middle ear effusion. No hemotympanum. Decreased hearing is noted.  Left Ear: External ear normal. No lacerations. No drainage, swelling or tenderness. No foreign bodies. No mastoid tenderness. Tympanic membrane is not injected, not scarred, not perforated, not erythematous, not retracted and not bulging.  No middle ear effusion. No hemotympanum. Decreased hearing is noted.  Impacted cerumen, lavage, removed.   Eyes: Conjunctivae and EOM are normal. Pupils are equal, round, and reactive to light.  Neck: Neck supple.  Cardiovascular: Normal rate, regular rhythm and normal heart sounds.   Pulmonary/Chest: Effort normal and breath sounds normal. No respiratory distress. She has no rales. She exhibits no tenderness.  Abdominal: Soft. Bowel sounds are normal. She exhibits no distension. There is no tenderness. There is no  rebound and no guarding.  External hemorrhoidsx2-not inflamed; from previous exam.  Genitourinary: Rectal exam shows guaiac negative stool.  Genitourinary Comments: Prior exam did not find hard stools in her rectal vault.   Musculoskeletal: She exhibits no edema (trace) or tenderness.       Left shoulder: She exhibits decreased range of motion, bony tenderness, swelling and pain. She exhibits no effusion, no crepitus, no deformity, no laceration, no spasm, normal pulse and normal strength.  C/o right knee pain with walking. No  focal swelling, warmth, or deformity.   Neurological: She is alert and oriented to person, place, and time. She has normal reflexes. No cranial nerve deficit.  involuntary lip movements.   Skin: Skin is warm and dry.  Psychiatric: Her mood appears not anxious. Her affect is not angry, not blunt, not labile and not inappropriate. Her speech is not rapid and/or pressured, not delayed, not tangential and not slurred. She is slowed. She is not agitated, not hyperactive, not withdrawn, not actively hallucinating and not combative. Thought content is not paranoid and not delusional. Cognition and memory are impaired. She does not express impulsivity or inappropriate judgment. She exhibits a depressed mood. She expresses no homicidal and no suicidal ideation. She expresses no suicidal plans and no homicidal plans. She is communicative. She exhibits abnormal recent memory.  A little anxious/flat affect/slow in responding, no frank hallucinosis.  She is attentive.    Labs reviewed:  Recent Labs  02/14/16 07/26/16 12/20/16 1644  NA 139 141  --   K 4.3 4.2  --   BUN 13 16 15   CREATININE 1.0 0.9 1.01    Recent Labs  02/14/16 07/26/16  AST 24 24  ALT 17 17  ALKPHOS 117 124    Recent Labs  02/14/16 07/26/16  WBC 6.9 7.6  HGB 12.1 12.1  HCT 36 36  PLT 320 306   Lab Results  Component Value Date   TSH 1.36 02/14/2016   Lab Results  Component Value Date   HGBA1C 6.0  02/14/2016   Lab Results  Component Value Date   CHOL 155 04/04/2013   HDL 53 04/04/2013   LDLCALC 80 04/04/2013   LDLDIRECT 190.0 03/24/2012   TRIG 108 04/04/2013   CHOLHDL 4 03/24/2012    Significant Diagnostic Results in last 30 days:  Ct Abdomen Pelvis W Contrast  Result Date: 12/31/2016 CLINICAL DATA:  6 months to 1 year hx of intermittent and bloating, increased gas, LLQ pain and tenderness. EXAM: CT ABDOMEN AND PELVIS WITH CONTRAST TECHNIQUE: Multidetector CT imaging of the abdomen and pelvis was performed using the standard protocol following bolus administration of intravenous contrast. CONTRAST:  131mL ISOVUE-300 IOPAMIDOL (ISOVUE-300) INJECTION 61% COMPARISON:  Multiple exams, including 08/30/2006 FINDINGS: Lower chest: 4 by 3 mm right middle lobe pleural-based nodule, image 2/3, no change from 2007, considered benign. Mild adjacent scarring in the right middle lobe. Mild scarring in the posterior basal segments of both lower lobes. Mild cardiomegaly with atherosclerotic calcification of the descending thoracic aorta. Small type 1 hiatal hernia. Hepatobiliary: Unremarkable Pancreas: Stable pancreatic atrophy. Spleen: Unremarkable Adrenals/Urinary Tract: 1.2 cm exophytic lesion of the left kidney upper pole, image 23/2, formerly 0.7 cm. This lesion has a portal venous phase density of 40 Hounsfield units and a delayed phase density of 37 Hounsfield units on the axial images, indicating complexity. 1.0 cm fluid density exophytic lesion of the left kidney lower pole, likely a cyst given the appearance. Several other tiny hypodense lesions of the left kidney are technically nonspecific although statistically likely to be cysts. Adrenal glands normal. Stomach/Bowel: Sigmoid colon diverticulosis. No current active diverticulitis. No dilated small bowel. Vascular/Lymphatic: Aortoiliac atherosclerotic vascular disease. Small retroperitoneal and pelvic lymph nodes are not pathologically enlarged by  size criteria. Reproductive: Uterus absent.  Adnexa unremarkable. Other: No supplemental non-categorized findings. Musculoskeletal: Mild rectus diastases with a small umbilical hernia. Pelvic floor laxity with low position of the anorectal junction and herniation of adipose tissue from the cul-de-sac region well below the pubococcygeal line  as shown on image 54/7. No cystocele noted. 40% superior endplate compression fracture at L1, likely old. Hemangiomas in the T9 and T12 vertebral bodies. Lumbar spondylosis and degenerative disc disease likely leading to mild impingement at L4-5 and L5-S1. Moderate to prominent degenerative chondral thinning in both hips. IMPRESSION: 1. Sigmoid diverticulosis without active diverticulitis. 2. 1.2 cm exophytic lesion from the left kidney upper pole is complex with portal venous phase density of 40 Hounsfield units and delayed phase density 37 Hounsfield units. This lesion was present in 2007 but smaller. Statistically this is most likely to be a complex but benign cyst, but a small slow growing renal cell carcinomas not entirely excluded. I do not have precontrast exam to compare to in order to be able to definitively assigned a Bosniak classification. Taking into account the very small size of the lesion and the patient's age, reasonable pathways might include renal protocol MRI with and without contrast to definitively characterize this lesion, or periodic surveillance for example in 1 years time on follow up CT in order to further assess. 3. Other imaging findings of potential clinical significance: Mild cardiomegaly. Small type 1 hiatal hernia. Aortoiliac atherosclerotic vascular disease. Pelvic floor laxity. Chronic 40% superior endplate compression fracture at L1. Potential mild impingement at L4-5 and L5-S1 due to spondylosis and degenerative disc disease. Electronically Signed   By: Van Clines M.D.   On: 12/31/2016 16:12    Assessment/Plan 1. Essential  hypertension controlled  2. Gastroesophageal reflux disease without esophagitis improved  3. Irritable bowel syndrome with constipation stable  4. Edema, unspecified type improved  5. Right knee pain, unspecified chronicity unchanged

## 2017-02-04 ENCOUNTER — Encounter: Payer: Self-pay | Admitting: Nurse Practitioner

## 2017-02-04 ENCOUNTER — Non-Acute Institutional Stay: Payer: Medicare Other | Admitting: Nurse Practitioner

## 2017-02-04 DIAGNOSIS — M25561 Pain in right knee: Secondary | ICD-10-CM | POA: Diagnosis not present

## 2017-02-04 DIAGNOSIS — R609 Edema, unspecified: Secondary | ICD-10-CM

## 2017-02-04 DIAGNOSIS — K5909 Other constipation: Secondary | ICD-10-CM

## 2017-02-04 DIAGNOSIS — F418 Other specified anxiety disorders: Secondary | ICD-10-CM | POA: Diagnosis not present

## 2017-02-04 DIAGNOSIS — K219 Gastro-esophageal reflux disease without esophagitis: Secondary | ICD-10-CM | POA: Diagnosis not present

## 2017-02-04 DIAGNOSIS — N318 Other neuromuscular dysfunction of bladder: Secondary | ICD-10-CM

## 2017-02-04 DIAGNOSIS — I10 Essential (primary) hypertension: Secondary | ICD-10-CM | POA: Diagnosis not present

## 2017-02-04 NOTE — Assessment & Plan Note (Signed)
Stable, continue Famotidine 20mg , Omeprazole 20mg , Mylanta qhs and prn

## 2017-02-04 NOTE — Progress Notes (Signed)
Location:  [facility] Room Number: [phone] Place of Service:  ALF [id number]) Provider:  Mast, Manxie  NP  Estill Dooms, MD  Patient Care Team: Estill Dooms, MD as PCP - General (Internal Medicine) [name], MD (Ophthalmology) [name], MD (Psychiatry) Mast, Man X, NP as Nurse Practitioner (Nurse Practitioner) [name], MD as Consulting Physician (Orthopedic Surgery)  Extended Emergency Contact Information Primary Emergency Contact: [name], [place] of [place] Work Phone: [phone] Relation: Son Secondary Emergency Contact: [name] [place] of [place] Phone: [phone] Relation: Sister  Code Status:  DNR Goals of care: Advanced Directive information Advanced Directives 02/04/2017  Does Patient Have a Medical Advance Directive? Yes  Type of Paramedic of [place];Living will;Out of facility DNR (pink MOST or yellow form)  Does patient want to make changes to medical advance directive? No - Patient declined  Copy of [place] in Chart? Yes  Pre-existing out of facility DNR order (yellow form or pink MOST form) Yellow form placed in chart (order not valid for inpatient use)     Chief Complaint  Patient presents with  . Acute Visit    (L) foot swelling,no pain,warmth, or redness.    HPI:  Pt is a 76 y.o. female seen today for an acute visit for    Past Medical History:  Diagnosis Date  . Allergy   . Anemia   . Anxiety   . Asthma   . CAD (coronary artery disease)   . Depression   . Diverticulosis   . Dysfunction of eustachian tube   . Elevated LFTs   . Esophageal reflux   . Esophageal stricture 2002  . Hemorrhoids   . History of rectal polyps   . HLD (hyperlipidemia)   . HTN (hypertension)   . Hypertonicity of bladder   . Irritable bowel syndrome with constipation   . Obesity   . Optic neuritis   . Osteoporosis   . Peptic  ulcer disease   . Peripheral neuropathy   . Trigeminal neuralgia    prior injury  . 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 Vitamin D deficiency    Past Surgical History:  Procedure Laterality Date  . ABDOMINAL HYSTERECTOMY    . ANAL RECTAL MANOMETRY N/A 07/05/2014   Procedure: ANO RECTAL MANOMETRY;  Surgeon: Leighton Ruff, MD;  Location: [facility];  Service: Endoscopy;  Laterality: N/A;  . APPENDECTOMY    . CAROTID ENDARTERECTOMY    . COLONOSCOPY  2008, 2015  . TONSILLECTOMY    . UPPER GASTROINTESTINAL ENDOSCOPY  2002    Allergies  Allergen Reactions  . Azithromycin   . Biaxin [Clarithromycin]   . Clarithromycin   . Doxycycline   . Fosamax [Alendronate Sodium]   . Geodon [Ziprasidone Hydrochloride]   . Lipitor [Atorvastatin Calcium]   . Penicillins   . Pravachol [Pravastatin Sodium]   . Statins   . Sulfonamide Derivatives   . Zocor [Simvastatin]     Outpatient Encounter Prescriptions as of 02/04/2017  Medication Sig  . acetaminophen (TYLENOL) 325 MG tablet Take 650 mg by mouth every 6 (six) hours as needed. And take 2 tabs by mouth at bedtime  . acetaminophen (TYLENOL) 650 MG CR tablet Take 650 mg by mouth at bedtime as needed for pain.  Marland Kitchen alum & mag hydroxide-simeth (MYLANTA) 200-200-20 MG/5ML  suspension Take by mouth. Take 9ml every 4 hours as needed and at bedtime for indigestion  . amitriptyline (ELAVIL) 50 MG tablet Take 50 mg by mouth at bedtime.  Marland Kitchen aspirin 81 MG EC tablet Take 81 mg by mouth daily.    . bisacodyl (BISACODYL) 5 MG EC tablet Take 5 mg by mouth. Take one tablet by mouth every day as needed for constipation  . bisacodyl (DULCOLAX) 10 MG suppository Place 10 mg rectally. Insert 1 supp rectally every day as needed for constipation  . calcium-vitamin D (OSCAL WITH D) 250-125 MG-UNIT tablet Take 1 tablet by mouth 2 (two) times daily. Reported on 12/27/2015  . carbamide peroxide (DEBROX) 6.5 %  otic solution Place 5 drops into both ears. Once a day on Wednesday  . chlordiazePOXIDE (LIBRIUM) 25 MG capsule Take 25 mg by mouth. Take one capsule daily as needed for anxiety; take one capsule at bedtime  . cyclobenzaprine (FLEXERIL) 5 MG tablet Take 5 mg by mouth 3 (three) times daily as needed for muscle spasms.  . famotidine (PEPCID) 20 MG tablet Take 20 mg by mouth at bedtime.  . fluPHENAZine (PROLIXIN) 1 MG tablet Take 1 mg by mouth at bedtime.  . fluticasone (FLONASE) 50 MCG/ACT nasal spray Place 1 spray into both nostrils 2 (two) times daily.  . furosemide (LASIX) 20 MG tablet 1 by mouth every am diuretic for peripheral edema  . HYDROcodone-acetaminophen (NORCO/VICODIN) 5-325 MG tablet Take one tablet by mouth twice daily for pain  . hydrocortisone cream 1 % Apply 1 application topically 2 (two) times daily.  . Linaclotide (LINZESS) 290 MCG CAPS capsule Take 290 mcg by mouth daily.  Marland Kitchen LORazepam (ATIVAN) 0.5 MG tablet Take one tablet by mouth twice daily for anxiety  . magnesium hydroxide (MILK OF MAGNESIA) 400 MG/5ML suspension Take by mouth. Take 30 ml by mouth every day as needed for constipation Take with prune juice at bedtime  . mometasone (ELOCON) 0.1 % cream Apply 1 application topically 2 (two) times daily as needed. To face  . Multiple Vitamins-Minerals (CENTRUM SILVER PO) Take 1 tablet by mouth.    Marland Kitchen omeprazole (PRILOSEC) 20 MG capsule Take 20 mg by mouth daily.  . polyvinyl alcohol (LIQUIFILM TEARS) 1.4 % ophthalmic solution Place 1 drop into both eyes 3 (three) times daily.  Marland Kitchen saccharomyces boulardii (FLORASTOR) 250 MG capsule Take 250 mg by mouth. Take one capsule once a day  . sodium phosphate Pediatric (FLEET) 3.5-9.5 GM/59ML enema Place 1 enema rectally. Insert one dose every three days   No facility-administered encounter medications on file as of 02/04/2017.     Review of Systems  Immunization History  Administered Date(s) Administered  . Influenza Split  07/23/2013  . Influenza Whole 07/05/2009  . Influenza-Unspecified 07/29/2014, 06/14/2015, 07/11/2016  . PPD Test 06/17/2013  . Pneumococcal Polysaccharide-23 05/05/2009  . Td 05/05/2009  . Zoster 01/09/2011   Pertinent  Health Maintenance Due  Topic Date Due  . DEXA SCAN  07/12/2004  . PNA vac Low Risk Adult (2 of 2 - PCV13) 05/05/2010  . INFLUENZA VACCINE  04/24/2017   Fall Risk  07/29/2014  Falls in the past year? No   Functional Status Survey:    Vitals:   02/04/17 1403  BP: 130/80  Pulse: 66  Resp: 16  Temp: 97.6 F (36.4 C)  Weight: 152 lb 3.2 oz (69 kg)  Height: 5' 1.5" (1.562 m)   Body mass index is 28.29 kg/m. Physical Exam  Labs reviewed:  Recent Labs  02/14/16 07/26/16 12/20/16 1644  NA 139 141  --   K 4.3 4.2  --   BUN 13 16 15   CREATININE 1.0 0.9 1.01    Recent Labs  02/14/16 07/26/16  AST 24 24  ALT 17 17  ALKPHOS 117 124    Recent Labs  02/14/16 07/26/16  WBC 6.9 7.6  HGB 12.1 12.1  HCT 36 36  PLT 320 306   Lab Results  Component Value Date   TSH 1.36 02/14/2016   Lab Results  Component Value Date   HGBA1C 6.0 02/14/2016   Lab Results  Component Value Date   CHOL 155 04/04/2013   HDL 53 04/04/2013   LDLCALC 80 04/04/2013   LDLDIRECT 190.0 03/24/2012   TRIG 108 04/04/2013   CHOLHDL 4 03/24/2012    Significant Diagnostic Results in last 30 days:  No results found.  Assessment/Plan 1. Edema, unspecified type   2. Hypertonicity of bladder   3. Essential hypertension   4. Gastroesophageal reflux disease without esophagitis   5. Constipation   6. Depression with anxiety   7. Right knee pain, unspecified chronicity     Family/ staff Communication:   Labs/tests ordered:

## 2017-02-04 NOTE — Assessment & Plan Note (Signed)
Blood pressure is controlled, continue Furosemide 20mg , Spironolactone 25mg  qd 5/141/8

## 2017-02-04 NOTE — Assessment & Plan Note (Signed)
Weight gain #7Ibs in 2 weeks, update CBC CMP TSH Hgb a1c UA C/S BNP, adding Spironolactone 25mg  qd, continue Furosemide 20mg , monitor wt ac breakfast.

## 2017-02-04 NOTE — Assessment & Plan Note (Signed)
Stable, continue Lorazepam 05mg  bid,  Amitriptyline 50mg  hs, Librium 25mg  prn, and Prolixin 1mg  hs.  Psych hospitalization x2 in the past.

## 2017-02-04 NOTE — Assessment & Plan Note (Signed)
Adult depends for urine leakage, f/u Urology

## 2017-02-04 NOTE — Assessment & Plan Note (Signed)
Continue Linzess, MOM, prn Bisacodyl po/PR, prn enema.   Chronic condition, has been managed by GI

## 2017-02-04 NOTE — Progress Notes (Signed)
Location:  [facility] Room Number: [phone] Place of Service:  ALF [phone]) Provider: Marlana Latus  NP  Patient Care Team: Estill Dooms, MD as PCP - General (Internal Medicine) [name], MD (Ophthalmology) [name], MD (Psychiatry) Bertrum Helmstetter X, NP as Nurse Practitioner (Nurse Practitioner) [name], MD as Consulting Physician (Orthopedic Surgery)  Extended Emergency Contact Information Primary Emergency Contact: [name], [place] Work Phone: [phone] Relation: Son Secondary Emergency Contact: [name] [place] Phone: [phone] Relation: Sister  Code Status: DNR Goals of Care: Advanced Directive information Advanced Directives 02/04/2017  Does Patient Have a Medical Advance Directive? Yes  Type of Paramedic of [place];Living will;Out of facility DNR (pink MOST or yellow form)  Does patient want to make changes to medical advance directive? No - Patient declined  Copy of [place] in Chart? Yes  Pre-existing out of facility DNR order (yellow form or pink MOST form) Yellow form placed in chart (order not valid for inpatient use)     Chief Complaint  Patient presents with  . Acute Visit    (L) foot swelling,no pain,warmth, or redness.    HPI: Patient is a 77 y.o. female seen in today for Weight gain #7Ibs in 2 weeks, c/o swelling in ankles, L>R, no noted redness, warmth, tenderness associated with the ankle edema. Denied SOB, cough, phlegm production, paroxsymal nocturnal orthopnea, chest pain/pressure/palpitation.     Hx of constipation, has been stable, f/u GI, Hx of GERD, stable while on Famotidine and Omeprazole, anxiety, depression, mood has been stable while on Amitriptyline 50mg , Librium 25mg  prn, Fluphenazine 1mg  hs, Lorazepam bid, muscle and joints aches pains, R knee pain, pain is managed with Norco bid,  Tylenol hs and prn, Flexeril prn HTN, controlled, chronic ankle edema is minimal while taking Furosemide 20mg , Bun/creat 16/0.9 07/26/16,  Depression screen PHQ 2/9 07/29/2014  Decreased Interest 0  Down, Depressed, Hopeless 0  PHQ - 2 Score 0    Fall Risk  07/29/2014  Falls in the past year? No   No flowsheet data found.   Health Maintenance  Topic Date Due  . DEXA SCAN  07/12/2004  . PNA vac Low Risk Adult (2 of 2 - PCV13) 05/05/2010  . INFLUENZA VACCINE  04/24/2017  . TETANUS/TDAP  05/06/2019    Urinary incontinence? Functional Status Survey:   Exercise? Diet? No exam data present Hearing:   Dentition: Pain:  Past Medical History:  Diagnosis Date  . Allergy   . Anemia   . Anxiety   . Asthma   . CAD (coronary artery disease)   . Depression   . Diverticulosis   . Dysfunction of eustachian tube   . Elevated LFTs   . Esophageal reflux   . Esophageal stricture 2002  . Hemorrhoids   . History of rectal polyps   . HLD (hyperlipidemia)   . HTN (hypertension)   . Hypertonicity of bladder   . Irritable bowel syndrome with constipation   . Obesity   . Optic neuritis   . Osteoporosis   . Peptic ulcer disease   . Peripheral neuropathy   . Trigeminal neuralgia    prior injury  . 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 Vitamin D deficiency     Past Surgical History:  Procedure Laterality Date  .  ABDOMINAL HYSTERECTOMY    . ANAL RECTAL MANOMETRY N/A 07/05/2014   Procedure: ANO RECTAL MANOMETRY;  Surgeon: Leighton Ruff, MD;  Location: [facility];  Service: Endoscopy;  Laterality: N/A;  . APPENDECTOMY    . CAROTID ENDARTERECTOMY    . COLONOSCOPY  2008, 2015  . TONSILLECTOMY    . UPPER GASTROINTESTINAL ENDOSCOPY  2002    Family History  Problem Relation Age of Onset  . Alzheimer's disease Mother   . Coronary artery disease Father   . Heart attack Father   . Heart disease Father   . Anuerysm  Father        AAA  . Ovarian cancer Sister   . Lung cancer Brother   . Heart disease Brother   . Anuerysm Brother        AAA  . Diabetes Neg Hx   . Colon cancer Neg Hx     Social History   Social History  . Marital status: Divorced    Spouse name: N/A  . Number of children: 2  . Years of education: N/A   Occupational History  . retired Retired   Social History Main Topics  . Smoking status: Never Smoker  . Smokeless tobacco: Never Used  . Alcohol use No  . Drug use: No  . Sexual activity: No   Other Topics Concern  . None   Social History Narrative   HSG, business college   Married 59-43yrs/divorced   2 son-'60; 2 granddaughters   Work: Network engineer, travel clerk   [place] home in a studio apartment, moved to [place] 04/2013   Patient never smoked. Did have second hand smoke exposure childhood and work   Alcohol none   Walks with walker    reports that she has never smoked. She has never used smokeless tobacco. She reports that she does not drink alcohol or use drugs.   Allergies  Allergen Reactions  . Azithromycin   . Biaxin [Clarithromycin]   . Clarithromycin   . Doxycycline   . Fosamax [Alendronate Sodium]   . Geodon [Ziprasidone Hydrochloride]   . Lipitor [Atorvastatin Calcium]   . Penicillins   . Pravachol [Pravastatin Sodium]   . Statins   . Sulfonamide Derivatives   . Zocor [Simvastatin]     Allergies as of 02/04/2017      Reactions   Azithromycin    Biaxin [clarithromycin]    Clarithromycin    Doxycycline    Fosamax [alendronate Sodium]    Geodon [ziprasidone Hydrochloride]    Lipitor [atorvastatin Calcium]    Penicillins    Pravachol [pravastatin Sodium]    Statins    Sulfonamide Derivatives    Zocor [simvastatin]       Medication List       Accurate as of 02/04/17  4:09 PM. Always use your most recent med list.          acetaminophen 650 MG CR tablet Commonly known as:  TYLENOL Take 650 mg by mouth at bedtime as needed for  pain.   acetaminophen 325 MG tablet Commonly known as:  TYLENOL Take 650 mg by mouth every 6 (six) hours as needed. And take 2 tabs by mouth at bedtime   amitriptyline 50 MG tablet Commonly known as:  ELAVIL Take 50 mg by mouth at bedtime.   aspirin 81 MG EC tablet Take 81 mg by mouth daily.   bisacodyl 10 MG suppository Commonly known as:  DULCOLAX Place 10 mg rectally. Insert 1 supp rectally every day as needed  for constipation   bisacodyl 5 MG EC tablet Generic drug:  bisacodyl Take 5 mg by mouth. Take one tablet by mouth every day as needed for constipation   calcium-vitamin D 250-125 MG-UNIT tablet Commonly known as:  OSCAL WITH D Take 1 tablet by mouth 2 (two) times daily. Reported on 12/27/2015   carbamide peroxide 6.5 % otic solution Commonly known as:  DEBROX Place 5 drops into both ears. Once a day on Wednesday   CENTRUM SILVER PO Take 1 tablet by mouth.   chlordiazePOXIDE 25 MG capsule Commonly known as:  LIBRIUM Take 25 mg by mouth. Take one capsule daily as needed for anxiety; take one capsule at bedtime   cyclobenzaprine 5 MG tablet Commonly known as:  FLEXERIL Take 5 mg by mouth 3 (three) times daily as needed for muscle spasms.   famotidine 20 MG tablet Commonly known as:  PEPCID Take 20 mg by mouth at bedtime.   fluPHENAZine 1 MG tablet Commonly known as:  PROLIXIN Take 1 mg by mouth at bedtime.   fluticasone 50 MCG/ACT nasal spray Commonly known as:  FLONASE Place 1 spray into both nostrils 2 (two) times daily.   furosemide 20 MG tablet Commonly known as:  LASIX 1 by mouth every am diuretic for peripheral edema   HYDROcodone-acetaminophen 5-325 MG tablet Commonly known as:  NORCO/VICODIN Take one tablet by mouth twice daily for pain   hydrocortisone cream 1 % Apply 1 application topically 2 (two) times daily.   LINZESS 290 MCG Caps capsule Generic drug:  linaclotide Take 290 mcg by mouth daily.   LORazepam 0.5 MG tablet Commonly  known as:  ATIVAN Take one tablet by mouth twice daily for anxiety   magnesium hydroxide 400 MG/5ML suspension Commonly known as:  MILK OF MAGNESIA Take by mouth. Take 30 ml by mouth every day as needed for constipation Take with prune juice at bedtime   mometasone 0.1 % cream Commonly known as:  ELOCON Apply 1 application topically 2 (two) times daily as needed. To face   MYLANTA 200-200-20 MG/5ML suspension Generic drug:  alum & mag hydroxide-simeth Take by mouth. Take 18ml every 4 hours as needed and at bedtime for indigestion   omeprazole 20 MG capsule Commonly known as:  PRILOSEC Take 20 mg by mouth daily.   polyvinyl alcohol 1.4 % ophthalmic solution Commonly known as:  LIQUIFILM TEARS Place 1 drop into both eyes 3 (three) times daily.   saccharomyces boulardii 250 MG capsule Commonly known as:  FLORASTOR Take 250 mg by mouth. Take one capsule once a day   sodium phosphate Pediatric 3.5-9.5 GM/59ML enema Place 1 enema rectally. Insert one dose every three days        Review of Systems:  Review of Systems  Constitutional: Positive for unexpected weight change. Negative for chills, diaphoresis and fever.       Weight gain from #142Ibs to 155Ibs in 2 weeks.   HENT: Positive for hearing loss and tinnitus. Negative for congestion, ear discharge, ear pain and nosebleeds.   Eyes: Negative for photophobia, pain and discharge.       Legally blind.   Respiratory: Negative for cough, shortness of breath and wheezing.   Cardiovascular: Positive for leg swelling. Negative for chest pain and palpitations.  Gastrointestinal: Positive for constipation. Negative for abdominal pain, diarrhea, nausea and vomiting.       C/o abd pain on and off. Not upon today's visit.   Genitourinary: Positive for frequency (adult depends). Negative for  dysuria, flank pain and urgency.       Pressure in her bladder area.   Musculoskeletal: Negative for back pain, myalgias and neck pain.       C/o  chronic right knee pain of variable intensity, still able to ambulate with walker as usual.   Skin: Negative for rash.  Neurological: Positive for tremors (diffused rigidity. ). Negative for dizziness, seizures, weakness (generalized) and headaches.       Lips smacking  Psychiatric/Behavioral: Negative for suicidal ideas. The patient is nervous/anxious.        Hx of delusion, well controlled. Stated she wakes up a few times at night, but no difficulty return to sleep.     Physical Exam: Vitals:   02/04/17 1403  BP: 130/80  Pulse: 66  Resp: 16  Temp: 97.6 F (36.4 C)  Weight: 152 lb 3.2 oz (69 kg)  Height: 5' 1.5" (1.562 m)   Body mass index is 28.29 kg/m. Physical Exam  Constitutional: She is oriented to person, place, and time. She appears well-developed and well-nourished. No distress.  HENT:  Head: Normocephalic and atraumatic.  Right Ear: External ear normal. No lacerations. No drainage, swelling or tenderness. No foreign bodies. No mastoid tenderness. Tympanic membrane is not injected, not scarred, not perforated, not erythematous, not retracted and not bulging. No middle ear effusion. No hemotympanum. Decreased hearing is noted.  Left Ear: External ear normal. No lacerations. No drainage, swelling or tenderness. No foreign bodies. No mastoid tenderness. Tympanic membrane is not injected, not scarred, not perforated, not erythematous, not retracted and not bulging.  No middle ear effusion. No hemotympanum. Decreased hearing is noted.  Impacted cerumen, lavage, removed.   Eyes: Conjunctivae and EOM are normal. Pupils are equal, round, and reactive to light.  Neck: Neck supple.  Cardiovascular: Normal rate, regular rhythm and normal heart sounds.   Pulmonary/Chest: Effort normal and breath sounds normal. No respiratory distress. She has no rales. She exhibits no tenderness.  Abdominal: Soft. Bowel sounds are normal. She exhibits no distension. There is no tenderness. There is no  rebound and no guarding.  External hemorrhoidsx2-not inflamed; from previous exam.  Genitourinary: Rectal exam shows guaiac negative stool.  Genitourinary Comments: Prior exam did not find hard stools in her rectal vault.   Musculoskeletal: She exhibits edema (trace). She exhibits no tenderness.       Left shoulder: She exhibits decreased range of motion, bony tenderness, swelling and pain. She exhibits no effusion, no crepitus, no deformity, no laceration, no spasm, normal pulse and normal strength.  C/o right knee pain with walking. No focal swelling, warmth, or deformity.  BLE edema 1+  Neurological: She is alert and oriented to person, place, and time. She has normal reflexes. No cranial nerve deficit.  involuntary lip movements.   Skin: Skin is warm and dry.  Psychiatric: Her mood appears not anxious. Her affect is not angry, not blunt, not labile and not inappropriate. Her speech is not rapid and/or pressured, not delayed, not tangential and not slurred. She is slowed. She is not agitated, not hyperactive, not withdrawn, not actively hallucinating and not combative. Thought content is not paranoid and not delusional. Cognition and memory are impaired. She does not express impulsivity or inappropriate judgment. She exhibits a depressed mood. She expresses no homicidal and no suicidal ideation. She expresses no suicidal plans and no homicidal plans. She is communicative. She exhibits abnormal recent memory.  A little anxious/flat affect/slow in responding, no frank hallucinosis.  She is attentive.  Labs reviewed: Basic Metabolic Panel:  Recent Labs  02/14/16 07/26/16 12/20/16 1644  NA 139 141  --   K 4.3 4.2  --   BUN 13 16 15   CREATININE 1.0 0.9 1.01  TSH 1.36  --   --    Liver Function Tests:  Recent Labs  02/14/16 07/26/16  AST 24 24  ALT 17 17  ALKPHOS 117 124   No results for input(s): LIPASE, AMYLASE in the last 8760 hours. No results for input(s): AMMONIA in the last  8760 hours. CBC:  Recent Labs  02/14/16 07/26/16  WBC 6.9 7.6  HGB 12.1 12.1  HCT 36 36  PLT 320 306   Lipid Panel: No results for input(s): CHOL, HDL, LDLCALC, TRIG, CHOLHDL, LDLDIRECT in the last 8760 hours. Lab Results  Component Value Date   HGBA1C 6.0 02/14/2016    Procedures: No results found.  Assessment/Plan PERIPHERAL EDEMA Weight gain #7Ibs in 2 weeks, update CBC CMP TSH Hgb a1c UA C/S BNP, adding Spironolactone 25mg  qd, continue Furosemide 20mg , monitor wt ac breakfast.    Hypertonicity of bladder Adult depends for urine leakage, f/u Urology  Essential hypertension Blood pressure is controlled, continue Furosemide 20mg , Spironolactone 25mg  qd 5/141/8  GERD (gastroesophageal reflux disease) Stable, continue Famotidine 20mg , Omeprazole 20mg , Mylanta qhs and prn  Constipation Continue Linzess, MOM, prn Bisacodyl po/PR, prn enema.   Chronic condition, has been managed by GI  Depression with anxiety Stable, continue Lorazepam 05mg  bid,  Amitriptyline 50mg  hs, Librium 25mg  prn, and Prolixin 1mg  hs.  Psych hospitalization x2 in the past.   Right knee pain Continue Tylenol hs and prn, Norco bid,  Flexeril prn. Continue to ambulate with walker.      Labs/tests ordered: CBC CMP TSH Hgb a1c UA C/S BNP

## 2017-02-04 NOTE — Assessment & Plan Note (Addendum)
Continue Tylenol hs and prn, Norco bid,  Flexeril prn. Continue to ambulate with walker.

## 2017-02-05 LAB — HEPATIC FUNCTION PANEL
ALK PHOS: 99 U/L (ref 25–125)
ALT: 15 U/L (ref 7–35)
AST: 24 U/L (ref 13–35)
Bilirubin, Total: 0.2 mg/dL

## 2017-02-05 LAB — BASIC METABOLIC PANEL
BUN: 14 mg/dL (ref 4–21)
CREATININE: 1.1 mg/dL (ref <=1.1)
Glucose: 89 mg/dL
Sodium: 139 mmol/L (ref 137–147)

## 2017-02-05 LAB — HEMOGLOBIN A1C: Hemoglobin A1C: 5.9

## 2017-02-05 LAB — CBC AND DIFFERENTIAL
HCT: 35 % — AB (ref 36–46)
Hemoglobin: 11.4 g/dL — AB (ref 12.0–16.0)
PLATELETS: 337 10*3/uL (ref 150–399)
WBC: 6.2 10^3/mL

## 2017-02-07 ENCOUNTER — Encounter: Payer: Self-pay | Admitting: Nurse Practitioner

## 2017-02-07 ENCOUNTER — Other Ambulatory Visit: Payer: Self-pay | Admitting: *Deleted

## 2017-02-13 ENCOUNTER — Other Ambulatory Visit: Payer: Self-pay | Admitting: Urology

## 2017-02-13 DIAGNOSIS — D3002 Benign neoplasm of left kidney: Secondary | ICD-10-CM

## 2017-03-01 ENCOUNTER — Ambulatory Visit (HOSPITAL_COMMUNITY)
Admission: RE | Admit: 2017-03-01 | Discharge: 2017-03-01 | Disposition: A | Discharge status: Home / Self Care | Payer: Medicare Other | Source: Ambulatory Visit | Attending: Urology | Admitting: Urology

## 2017-03-01 DIAGNOSIS — K869 Disease of pancreas, unspecified: Secondary | ICD-10-CM | POA: Diagnosis not present

## 2017-03-01 DIAGNOSIS — N281 Cyst of kidney, acquired: Secondary | ICD-10-CM | POA: Insufficient documentation

## 2017-03-01 DIAGNOSIS — D3002 Benign neoplasm of left kidney: Secondary | ICD-10-CM | POA: Diagnosis present

## 2017-03-01 MED ORDER — GADOBENATE DIMEGLUMINE 529 MG/ML IV SOLN
15.0000 mL | Freq: Once | INTRAVENOUS | Status: AC | PRN
  Administered 2017-03-01: 14 mL via INTRAVENOUS

## 2017-03-21 ENCOUNTER — Other Ambulatory Visit: Payer: Self-pay | Admitting: *Deleted

## 2017-03-21 LAB — BASIC METABOLIC PANEL
BUN: 17 (ref 4–21)
Creatinine: 1.1 (ref <=1.1)
GLUCOSE: 84
Potassium: 4.4 (ref 3.4–5.3)
Sodium: 137 (ref 137–147)

## 2017-04-11 ENCOUNTER — Encounter: Payer: Self-pay | Admitting: Nurse Practitioner

## 2017-05-23 ENCOUNTER — Non-Acute Institutional Stay: Payer: Medicare Other | Admitting: Internal Medicine

## 2017-05-23 ENCOUNTER — Encounter: Payer: Self-pay | Admitting: Internal Medicine

## 2017-05-23 DIAGNOSIS — M25562 Pain in left knee: Secondary | ICD-10-CM | POA: Diagnosis not present

## 2017-05-23 DIAGNOSIS — K219 Gastro-esophageal reflux disease without esophagitis: Secondary | ICD-10-CM | POA: Diagnosis not present

## 2017-05-23 DIAGNOSIS — I10 Essential (primary) hypertension: Secondary | ICD-10-CM

## 2017-05-23 DIAGNOSIS — T402X5A Adverse effect of other opioids, initial encounter: Secondary | ICD-10-CM

## 2017-05-23 DIAGNOSIS — M25561 Pain in right knee: Secondary | ICD-10-CM

## 2017-05-23 DIAGNOSIS — G8929 Other chronic pain: Secondary | ICD-10-CM

## 2017-05-23 DIAGNOSIS — K5903 Drug induced constipation: Secondary | ICD-10-CM

## 2017-05-23 NOTE — Progress Notes (Signed)
Location:  [facility] Room Number: [phone] Place of Service:  ALF [phone]) Provider:  Blanchie Serve MD  Blanchie Serve, MD  Patient Care Team: Blanchie Serve, MD as PCP - General (Internal Medicine) [name], MD (Ophthalmology) [name], MD (Psychiatry) Mast, Man X, NP as Nurse Practitioner (Nurse Practitioner) [name], MD as Consulting Physician (Orthopedic Surgery)  Extended Emergency Contact Information Primary Emergency Contact: [name], [place] Work Phone: [phone] Relation: Son Secondary Emergency Contact: [name] [place] Phone: [phone] Relation: Sister  Code Status: DNR Goals of care: Advanced Directive information Advanced Directives 02/04/2017  Does Patient Have a Medical Advance Directive? Yes  Type of Paramedic of [place];Living will;Out of facility DNR (pink MOST or yellow form)  Does patient want to make changes to medical advance directive? No - Patient declined  Copy of [place] in Chart? Yes  Pre-existing out of facility DNR order (yellow form or pink MOST form) Yellow form placed in chart (order not valid for inpatient use)     Chief Complaint  Patient presents with  . Medical Management of Chronic Issues    Routine Visit     HPI:  Pt is a 76 y.o. female seen today for medical management of chronic diseases.  She is seen in her room today. She is legally blind. She gets around with her walker. No fall reported. She has ongoing behavioral issue and follows with psychiatry. Her tray has to be set up and then she feeds herself. She has chronic constipation and medication has been helpful. 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 Her nerves are calm with lorazepam. She gets muscle relaxant as needed. Her reflux has been under control.    Past Medical History:  Diagnosis Date  . Allergy   .  Anemia   . Anxiety   . Asthma   . CAD (coronary artery disease)   . Depression   . Diverticulosis   . Dysfunction of eustachian tube   . Elevated LFTs   . Esophageal reflux   . Esophageal stricture 2002  . Hemorrhoids   . History of rectal polyps   . HLD (hyperlipidemia)   . HTN (hypertension)   . Hypertonicity of bladder   . Irritable bowel syndrome with constipation   . Obesity   . Optic neuritis   . Osteoporosis   . Peptic ulcer disease   . Peripheral neuropathy   . Trigeminal neuralgia    prior injury  . 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 Vitamin D deficiency    Past Surgical History:  Procedure Laterality Date  . ABDOMINAL HYSTERECTOMY    . ANAL RECTAL MANOMETRY N/A 07/05/2014   Procedure: ANO RECTAL MANOMETRY;  Surgeon: Leighton Ruff, MD;  Location: [facility];  Service: Endoscopy;  Laterality: N/A;  . APPENDECTOMY    . CAROTID ENDARTERECTOMY    . COLONOSCOPY  2008, 2015  . TONSILLECTOMY    . UPPER GASTROINTESTINAL ENDOSCOPY  2002    Allergies  Allergen Reactions  . Azithromycin   . Biaxin [Clarithromycin]   . Clarithromycin   . Doxycycline   . Fosamax [Alendronate Sodium]   . Geodon [Ziprasidone Hydrochloride]   . Lipitor [Atorvastatin Calcium]   . Penicillins   . Pravachol [Pravastatin Sodium]   . Statins   .  Sulfonamide Derivatives   . Zocor [Simvastatin]     Outpatient Encounter Prescriptions as of 05/23/2017  Medication Sig  . acetaminophen (TYLENOL) 325 MG tablet Take 650 mg by mouth every 6 (six) hours as needed.   Marland Kitchen acetaminophen (TYLENOL) 325 MG tablet Take 650 mg by mouth at bedtime. Take as needed in addition to the scheduled bedtime dose  . acetaminophen (TYLENOL) 650 MG CR tablet Take 650 mg by mouth daily.   Marland Kitchen alum & mag hydroxide-simeth (MYLANTA) 200-200-20 MG/5ML suspension Take by mouth. Take 30ml every 4 hours as needed and at bedtime for indigestion  . amitriptyline  (ELAVIL) 50 MG tablet Take 50 mg by mouth at bedtime.  Marland Kitchen aspirin 81 MG EC tablet Take 81 mg by mouth daily.    . bisacodyl (BISACODYL) 5 MG EC tablet Take 5 mg by mouth daily as needed.   . bisacodyl (DULCOLAX) 10 MG suppository Place 10 mg rectally daily as needed.   . calcium-vitamin D (OSCAL WITH D) 250-125 MG-UNIT tablet Take 1 tablet by mouth 2 (two) times daily. Reported on 12/27/2015  . carbamide peroxide (DEBROX) 6.5 % otic solution Place 5 drops into both ears. Once a day on Wednesday  . chlordiazePOXIDE (LIBRIUM) 25 MG capsule Take 25 mg by mouth daily as needed (in addition to the scheduled dose at bedtime).   . chlordiazePOXIDE (LIBRIUM) 25 MG capsule Take 25 mg by mouth at bedtime.  . cyclobenzaprine (FLEXERIL) 5 MG tablet Take 5 mg by mouth 3 (three) times daily as needed for muscle spasms.  . famotidine (PEPCID) 20 MG tablet Take 20 mg by mouth at bedtime.  . fluPHENAZine (PROLIXIN) 1 MG tablet Take 1 mg by mouth at bedtime.  . fluticasone (FLONASE) 50 MCG/ACT nasal spray Place 1 spray into both nostrils 2 (two) times daily.  . furosemide (LASIX) 20 MG tablet Take 20 mg by mouth daily.   Marland Kitchen HYDROcodone-acetaminophen (NORCO/VICODIN) 5-325 MG tablet Take one tablet by mouth twice daily for pain  . hydrocortisone 2.5 % cream Apply 1 application topically 2 (two) times daily as needed.  . hydrocortisone cream 1 % Apply 1 application topically 2 (two) times daily.  . Linaclotide (LINZESS) 290 MCG CAPS capsule Take 290 mcg by mouth daily.  Marland Kitchen LORazepam (ATIVAN) 0.5 MG tablet Take one tablet by mouth twice daily for anxiety  . magnesium hydroxide (MILK OF MAGNESIA) 400 MG/5ML suspension Take 30 mLs by mouth at bedtime.   . mometasone (ELOCON) 0.1 % cream Apply 1 application topically 2 (two) times daily as needed. To face  . Multiple Vitamins-Minerals (CENTRUM SILVER PO) Take 1 tablet by mouth.    Marland Kitchen omeprazole (PRILOSEC) 20 MG capsule Take 20 mg by mouth daily.  . polyvinyl alcohol  (LIQUIFILM TEARS) 1.4 % ophthalmic solution Place 1 drop into both eyes 3 (three) times daily.  Marland Kitchen saccharomyces boulardii (FLORASTOR) 250 MG capsule Take 250 mg by mouth daily.   . sodium phosphate Pediatric (FLEET) 3.5-9.5 GM/59ML enema Place 1 enema rectally. Insert one dose every three days  . spironolactone (ALDACTONE) 25 MG tablet Take 25 mg by mouth daily.   No facility-administered encounter medications on file as of 05/23/2017.     Review of Systems  Constitutional: Negative for appetite change, chills, fatigue and fever.  HENT: Negative for congestion, mouth sores, sore throat and trouble swallowing.   Eyes: Positive for visual disturbance.  Respiratory: Negative for cough and shortness of breath.   Cardiovascular: Negative for chest pain, palpitations and leg  swelling.  Gastrointestinal: Negative for abdominal pain, blood in stool, diarrhea, nausea and vomiting.  Genitourinary: Negative for dysuria and frequency.  Musculoskeletal: Positive for gait problem.       Unsteady gait  Skin: Negative for rash and wound.  Neurological: Negative for seizures and headaches.  Hematological: Does not bruise/bleed easily.  Psychiatric/Behavioral: Positive for behavioral problems. Negative for confusion.    Immunization History  Administered Date(s) Administered  . Influenza Split 07/23/2013  . Influenza Whole 07/05/2009  . Influenza-Unspecified 07/29/2014, 06/14/2015, 07/11/2016  . PPD Test 06/17/2013  . Pneumococcal Polysaccharide-23 05/05/2009  . Td 05/05/2009  . Zoster 01/09/2011   Pertinent  Health Maintenance Due  Topic Date Due  . DEXA SCAN  07/12/2004  . PNA vac Low Risk Adult (2 of 2 - PCV13) 05/05/2010  . INFLUENZA VACCINE  04/24/2017   Fall Risk  07/29/2014  Falls in the past year? No   Functional Status Survey:    Vitals:   05/23/17 1336  BP: 130/68  Pulse: 70  Resp: 18  Temp: 98.2 F (36.8 C)  TempSrc: Oral  Weight: 148 lb 12.8 oz (67.5 kg)  Height: 5'  1.5" (1.562 m)   Body mass index is 27.66 kg/m. Physical Exam  Constitutional: She is oriented to person, place, and time. She appears well-developed and well-nourished.  HENT:  Head: Normocephalic and atraumatic.  Mouth/Throat: Oropharynx is clear and moist.  Eyes: Conjunctivae are normal. Right eye exhibits no discharge. Left eye exhibits no discharge.  Neck: Normal range of motion. Neck supple. No thyromegaly present.  Cardiovascular: Normal rate and regular rhythm.   Pulmonary/Chest: Effort normal and breath sounds normal. She has no wheezes. She has no rales.  Abdominal: Soft. Bowel sounds are normal. There is no tenderness.  Musculoskeletal: She exhibits no edema.  Can move all 4 extremities, uses a walker  Lymphadenopathy:    She has no cervical adenopathy.  Neurological: She is alert and oriented to person, place, and time.  Skin: Skin is warm and dry. No rash noted.  Psychiatric: She has a normal mood and affect.    Labs reviewed:  Recent Labs  07/26/16 12/20/16 1644 02/05/17 03/21/17  NA 141  --  139 137  K 4.2  --   --  4.4  BUN 16 15 14 17   CREATININE 0.9 1.01 1.1 1.1    Recent Labs  07/26/16 02/05/17  AST 24 24  ALT 17 15  ALKPHOS 124 99    Recent Labs  07/26/16 02/05/17  WBC 7.6 6.2  HGB 12.1 11.4*  HCT 36 35*  PLT 306 337   Lab Results  Component Value Date   TSH 1.36 02/14/2016   Lab Results  Component Value Date   HGBA1C 5.9 02/05/2017   Lab Results  Component Value Date   CHOL 155 04/04/2013   HDL 53 04/04/2013   LDLCALC 80 04/04/2013   LDLDIRECT 190.0 03/24/2012   TRIG 108 04/04/2013   CHOLHDL 4 03/24/2012    Significant Diagnostic Results in last 30 days:  No results found.  Assessment/Plan  Chronic Knee pain With OA and meniscus tear. currently on norco 5-325 mg bid, denies any pain this visit.  change this to norco 5-325 mg daily in am and keep evening dose only as prn for now and monitor. Continue tylenol 650 mg bid with  650 mg q6h prn for breakthrough pain. Also on prn flexeril, change this to bid prn and monitor  Chronic constipation With her being on opioid.  Continue linzess, bisacodyl and mylanta for now. Maintain hydration. Continue prn suppository and enema. Check TSH  gerd Denies heartburn. On omeprazole 20 mg daily, decrease this to 10 mg daily and monitor. Continue her famotidine  HTN Controlled BP reading. Currently on spironolactone 25 mg daily and furosemide 20 mg daily. Check BMP. Decrease spironolactone to 12.5 mg daily and monitor. No known history of CHF.  Family/ staff Communication: reviewed care plan with patient and charge nurse.    Labs/tests ordered:  TSH  Blanchie Serve, MD Internal Medicine [facility] [address] [place], [place] [address] Cell Phone (Monday-Friday 8 am - 5 pm): [phone] On Call: [phone] and follow prompts after 5 pm and on weekends Office Phone: [phone] Office Fax: [phone]

## 2017-05-28 LAB — TSH: TSH: 2.55 (ref <=5.90)

## 2017-05-29 ENCOUNTER — Other Ambulatory Visit: Payer: Self-pay | Admitting: *Deleted

## 2017-06-04 ENCOUNTER — Non-Acute Institutional Stay (SKILLED_NURSING_FACILITY): Payer: Medicare Other

## 2017-06-04 DIAGNOSIS — Z0000 Encounter for general adult medical examination without abnormal findings: Secondary | ICD-10-CM | POA: Diagnosis not present

## 2017-06-04 NOTE — Progress Notes (Signed)
Subjective:   Jean [name] is a 76 y.o. female who presents for Medicare Annual (Subsequent) preventive examination at [facility]  Last AWV-05/11/2010    Objective:     Vitals: BP 130/78 (BP Location: Left Arm, Patient Position: Sitting)   Pulse 77   Temp 97.7 F (36.5 C) (Oral)   Ht 5\' 2"  (1.575 m)   Wt 149 lb (67.6 kg)   SpO2 95%   BMI 27.25 kg/m   Body mass index is 27.25 kg/m.   Tobacco History  Smoking Status  . Never Smoker  Smokeless Tobacco  . Never Used     Counseling given: Not Answered   Past Medical History:  Diagnosis Date  . Allergy   . Anemia   . Anxiety   . Asthma   . CAD (coronary artery disease)   . Depression   . Diverticulosis   . Dysfunction of eustachian tube   . Elevated LFTs   . Esophageal reflux   . Esophageal stricture 2002  . Hemorrhoids   . History of rectal polyps   . HLD (hyperlipidemia)   . HTN (hypertension)   . Hypertonicity of bladder   . Irritable bowel syndrome with constipation   . Obesity   . Optic neuritis   . Osteoporosis   . Peptic ulcer disease   . Peripheral neuropathy   . Trigeminal neuralgia    prior injury  . 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 Vitamin D deficiency    Past Surgical History:  Procedure Laterality Date  . ABDOMINAL HYSTERECTOMY    . ANAL RECTAL MANOMETRY N/A 07/05/2014   Procedure: ANO RECTAL MANOMETRY;  Surgeon: Leighton Ruff, MD;  Location: [facility];  Service: Endoscopy;  Laterality: N/A;  . APPENDECTOMY    . CAROTID ENDARTERECTOMY    . COLONOSCOPY  2008, 2015  . TONSILLECTOMY    . UPPER GASTROINTESTINAL ENDOSCOPY  2002   Family History  Problem Relation Age of Onset  . Alzheimer's disease Mother   . Coronary artery disease Father   . Heart attack Father   . Heart disease Father   . Anuerysm Father        AAA  . Ovarian cancer Sister   . Lung cancer Brother   . Heart  disease Brother   . Anuerysm Brother        AAA  . Diabetes Neg Hx   . Colon cancer Neg Hx    History  Sexual Activity  . Sexual activity: No    Outpatient Encounter Prescriptions as of 06/04/2017  Medication Sig  . acetaminophen (TYLENOL) 325 MG tablet Take 650 mg by mouth every 6 (six) hours as needed.   Marland Kitchen acetaminophen (TYLENOL) 325 MG tablet Take 650 mg by mouth at bedtime. Take as needed in addition to the scheduled bedtime dose  . acetaminophen (TYLENOL) 650 MG CR tablet Take 650 mg by mouth daily.   Marland Kitchen alum & mag hydroxide-simeth (MYLANTA) 200-200-20 MG/5ML suspension Take by mouth. Take 52ml every 4 hours as needed and at bedtime for indigestion  . amitriptyline (ELAVIL) 50 MG tablet Take 50 mg by mouth at bedtime.  Marland Kitchen aspirin 81 MG EC tablet Take 81 mg by mouth daily.    . bisacodyl (BISACODYL) 5 MG EC tablet Take 5 mg by mouth daily as needed.   . calcium-vitamin D (OSCAL WITH D) 250-125 MG-UNIT tablet Take 1 tablet by  mouth 2 (two) times daily. Reported on 12/27/2015  . carbamide peroxide (DEBROX) 6.5 % otic solution Place 5 drops into both ears. Once a day on Wednesday  . chlordiazePOXIDE (LIBRIUM) 25 MG capsule Take 25 mg by mouth daily as needed (in addition to the scheduled dose at bedtime).   . chlordiazePOXIDE (LIBRIUM) 25 MG capsule Take 25 mg by mouth at bedtime.  . cyclobenzaprine (FLEXERIL) 5 MG tablet Take 5 mg by mouth 3 (three) times daily as needed for muscle spasms.  . famotidine (PEPCID) 20 MG tablet Take 20 mg by mouth at bedtime.  . fluPHENAZine (PROLIXIN) 1 MG tablet Take 1 mg by mouth at bedtime.  . fluticasone (FLONASE) 50 MCG/ACT nasal spray Place 1 spray into both nostrils 2 (two) times daily.  . furosemide (LASIX) 20 MG tablet Take 20 mg by mouth daily.   Marland Kitchen HYDROcodone-acetaminophen (NORCO/VICODIN) 5-325 MG tablet Take one tablet by mouth twice daily for pain  . hydrocortisone 2.5 % cream Apply 1 application topically 2 (two) times daily as needed.  .  hydrocortisone cream 1 % Apply 1 application topically 2 (two) times daily.  . Linaclotide (LINZESS) 290 MCG CAPS capsule Take 290 mcg by mouth daily.  Marland Kitchen LORazepam (ATIVAN) 0.5 MG tablet Take one tablet by mouth twice daily for anxiety  . magnesium hydroxide (MILK OF MAGNESIA) 400 MG/5ML suspension Take 30 mLs by mouth at bedtime.   . mometasone (ELOCON) 0.1 % cream Apply 1 application topically 2 (two) times daily as needed. To face  . Multiple Vitamins-Minerals (CENTRUM SILVER PO) Take 1 tablet by mouth.    Marland Kitchen omeprazole (PRILOSEC) 10 MG capsule Take 10 mg by mouth daily.  . polyvinyl alcohol (LIQUIFILM TEARS) 1.4 % ophthalmic solution Place 1 drop into both eyes 3 (three) times daily.  Marland Kitchen saccharomyces boulardii (FLORASTOR) 250 MG capsule Take 250 mg by mouth daily.   . sodium phosphate Pediatric (FLEET) 3.5-9.5 GM/59ML enema Place 1 enema rectally. Insert one dose every three days  . spironolactone (ALDACTONE) 12.5 mg TABS tablet Take 12.5 mg by mouth daily.  . [DISCONTINUED] bisacodyl (DULCOLAX) 10 MG suppository Place 10 mg rectally daily as needed.   . [DISCONTINUED] omeprazole (PRILOSEC) 20 MG capsule Take 20 mg by mouth daily.  . [DISCONTINUED] spironolactone (ALDACTONE) 25 MG tablet Take 25 mg by mouth daily.   No facility-administered encounter medications on file as of 06/04/2017.     Activities of Daily Living In your present state of health, do you have any difficulty performing the following activities: 06/04/2017  Hearing? Y  Vision? Y  Difficulty concentrating or making decisions? N  Walking or climbing stairs? Y  Dressing or bathing? Y  Doing errands, shopping? Y  Preparing Food and eating ? Y  Using the Toilet? Y  In the past six months, have you accidently leaked urine? Y  Do you have problems with loss of bowel control? N  Managing your Medications? Y  Managing your Finances? Y  Housekeeping or managing your Housekeeping? Y  Some recent data might be hidden     Patient Care Team: Blanchie Serve, MD as PCP - General (Internal Medicine) [name], MD (Ophthalmology) [name], MD (Psychiatry) Mast, Man X, NP as Nurse Practitioner (Nurse Practitioner) [name], MD as Consulting Physician (Orthopedic Surgery)    Assessment:     Exercise Activities and Dietary recommendations Current Exercise Habits: Structured exercise class, Type of exercise: stretching;walking;strength training/weights, Time (Minutes): 30, Frequency (Times/Week): 5, Weekly Exercise (Minutes/Week): 150, Intensity: Mild,  Exercise limited by: Other - see comments (legally blind)  Goals    None     Fall Risk Fall Risk  06/04/2017 07/29/2014  Falls in the past year? Yes No  Number falls in past yr: 1 -  Injury with Fall? No -   Depression Screen PHQ 2/9 Scores 06/04/2017 07/29/2014  PHQ - 2 Score 0 0     Cognitive Function MMSE - Mini Mental State Exam 06/04/2017  Orientation to time 4  Orientation to Place 5  Registration 3  Attention/ Calculation 5  Recall 2  Language- name 2 objects 2  Language- repeat 1  Language- follow 3 step command 3  Language- read & follow direction 0  Write a sentence 0  Copy design 0  Total score 25        Immunization History  Administered Date(s) Administered  . Influenza Split 07/23/2013  . Influenza Whole 07/05/2009  . Influenza-Unspecified 07/29/2014, 06/14/2015, 07/11/2016  . PPD Test 06/17/2013  . Pneumococcal Polysaccharide-23 05/05/2009  . Td 05/05/2009  . Zoster 01/09/2011   Screening Tests Health Maintenance  Topic Date Due  . DEXA SCAN  07/12/2004  . PNA vac Low Risk Adult (2 of 2 - PCV13) 05/05/2010  . INFLUENZA VACCINE  04/24/2017  . TETANUS/TDAP  05/06/2019      Plan:    I have personally reviewed and addressed the Medicare Annual Wellness questionnaire and have noted the following in the patient's chart:  A. Medical and social history B. Use of alcohol, tobacco or illicit drugs   C. Current medications and supplements D. Functional ability and status E.  Nutritional status F.  Physical activity G. Advance directives H. List of other physicians I.  Hospitalizations, surgeries, and ER visits in previous 12 months J.  [place] to include hearing, vision, cognitive, depression L. Referrals and appointments - none  In addition, I have reviewed and discussed with patient certain preventive protocols, quality metrics, and best practice recommendations. A written personalized care plan for preventive services as well as general preventive health recommendations were provided to patient.  See attached scanned questionnaire for additional information.   Signed,   Rich Reining, RN Nurse Health Advisor   Quick Notes   Health Maintenance: Prevnar and DEXA due     Abnormal Screen: 25/27 MMSE. Patient is legally blind and unable to complete vision portion of test.     Patient Concerns: None     Nurse Concerns: None

## 2017-06-04 NOTE — Patient Instructions (Signed)
Jean [name] , Thank you for taking time to come for your Medicare Wellness Visit. I appreciate your ongoing commitment to your health goals. Please review the following plan we discussed and let me know if I can assist you in the future.   Screening recommendations/referrals: Colonoscopy excluded, pt over age 78 Mammogram excluded, pt over age 57 Bone Density due Recommended yearly ophthalmology/optometry visit for glaucoma screening and checkup Recommended yearly dental visit for hygiene and checkup  Vaccinations: Influenza vaccine due Pneumococcal vaccine 13 due Tdap vaccine up to date. Due 05/06/19 Shingles vaccine not in records    Advanced directives: Up to Date  Conditions/risks identified: None  Next appointment: Dr. Bubba Camp makes rounds   Preventive Care 64 Years and Older, Female Preventive care refers to lifestyle choices and visits with your health care provider that can promote health and wellness. What does preventive care include?  A yearly physical exam. This is also called an annual well check.  Dental exams once or twice a year.  Routine eye exams. Ask your health care provider how often you should have your eyes checked.  Personal lifestyle choices, including:  Daily care of your teeth and gums.  Regular physical activity.  Eating a healthy diet.  Avoiding tobacco and drug use.  Limiting alcohol use.  Practicing safe sex.  Taking low-dose aspirin every day.  Taking vitamin and mineral supplements as recommended by your health care provider. What happens during an annual well check? The services and screenings done by your health care provider during your annual well check will depend on your age, overall health, lifestyle risk factors, and family history of disease. Counseling  Your health care provider may ask you questions about your:  Alcohol use.  Tobacco use.  Drug use.  Emotional well-being.  Home and relationship  well-being.  Sexual activity.  Eating habits.  History of falls.  Memory and ability to understand (cognition).  Work and work Statistician.  Reproductive health. Screening  You may have the following tests or measurements:  Height, weight, and BMI.  Blood pressure.  Lipid and cholesterol levels. These may be checked every 5 years, or more frequently if you are over 2 years old.  Skin check.  Lung cancer screening. You may have this screening every year starting at age 42 if you have a 30-pack-year history of smoking and currently smoke or have quit within the past 15 years.  Fecal occult blood test (FOBT) of the stool. You may have this test every year starting at age 31.  Flexible sigmoidoscopy or colonoscopy. You may have a sigmoidoscopy every 5 years or a colonoscopy every 10 years starting at age 80.  Hepatitis C blood test.  Hepatitis B blood test.  Sexually transmitted disease (STD) testing.  Diabetes screening. This is done by checking your blood sugar (glucose) after you have not eaten for a while (fasting). You may have this done every 1-3 years.  Bone density scan. This is done to screen for osteoporosis. You may have this done starting at age 60.  Mammogram. This may be done every 1-2 years. Talk to your health care provider about how often you should have regular mammograms. Talk with your health care provider about your test results, treatment options, and if necessary, the need for more tests. Vaccines  Your health care provider may recommend certain vaccines, such as:  Influenza vaccine. This is recommended every year.  Tetanus, diphtheria, and acellular pertussis (Tdap, Td) vaccine. You may need a Td booster  every 10 years.  Zoster vaccine. You may need this after age 22.  Pneumococcal 13-valent conjugate (PCV13) vaccine. One dose is recommended after age 14.  Pneumococcal polysaccharide (PPSV23) vaccine. One dose is recommended after age  75. Talk to your health care provider about which screenings and vaccines you need and how often you need them. This information is not intended to replace advice given to you by your health care provider. Make sure you discuss any questions you have with your health care provider. Document Released: 10/07/2015 Document Revised: 05/30/2016 Document Reviewed: 07/12/2015 Elsevier Interactive Patient Education  2017 [place] Prevention in the Home Falls can cause injuries. They can happen to people of all ages. There are many things you can do to make your home safe and to help prevent falls. What can I do on the outside of my home?  Regularly fix the edges of walkways and driveways and fix any cracks.  Remove anything that might make you trip as you walk through a door, such as a raised step or threshold.  Trim any bushes or trees on the path to your home.  Use bright outdoor lighting.  Clear any walking paths of anything that might make someone trip, such as rocks or tools.  Regularly check to see if handrails are loose or broken. Make sure that both sides of any steps have handrails.  Any raised decks and porches should have guardrails on the edges.  Have any leaves, snow, or ice cleared regularly.  Use sand or salt on walking paths during winter.  Clean up any spills in your garage right away. This includes oil or grease spills. What can I do in the bathroom?  Use night lights.  Install grab bars by the toilet and in the tub and shower. Do not use towel bars as grab bars.  Use non-skid mats or decals in the tub or shower.  If you need to sit down in the shower, use a plastic, non-slip stool.  Keep the floor dry. Clean up any water that spills on the floor as soon as it happens.  Remove soap buildup in the tub or shower regularly.  Attach bath mats securely with double-sided non-slip rug tape.  Do not have throw rugs and other things on the floor that can make  you trip. What can I do in the bedroom?  Use night lights.  Make sure that you have a light by your bed that is easy to reach.  Do not use any sheets or blankets that are too big for your bed. They should not hang down onto the floor.  Have a firm chair that has side arms. You can use this for support while you get dressed.  Do not have throw rugs and other things on the floor that can make you trip. What can I do in the kitchen?  Clean up any spills right away.  Avoid walking on wet floors.  Keep items that you use a lot in easy-to-reach places.  If you need to reach something above you, use a strong step stool that has a grab bar.  Keep electrical cords out of the way.  Do not use floor polish or wax that makes floors slippery. If you must use wax, use non-skid floor wax.  Do not have throw rugs and other things on the floor that can make you trip. What can I do with my stairs?  Do not leave any items on the stairs.  Make  sure that there are handrails on both sides of the stairs and use them. Fix handrails that are broken or loose. Make sure that handrails are as long as the stairways.  Check any carpeting to make sure that it is firmly attached to the stairs. Fix any carpet that is loose or worn.  Avoid having throw rugs at the top or bottom of the stairs. If you do have throw rugs, attach them to the floor with carpet tape.  Make sure that you have a light switch at the top of the stairs and the bottom of the stairs. If you do not have them, ask someone to add them for you. What else can I do to help prevent falls?  Wear shoes that:  Do not have high heels.  Have rubber bottoms.  Are comfortable and fit you well.  Are closed at the toe. Do not wear sandals.  If you use a stepladder:  Make sure that it is fully opened. Do not climb a closed stepladder.  Make sure that both sides of the stepladder are locked into place.  Ask someone to hold it for you, if  possible.  Clearly mark and make sure that you can see:  Any grab bars or handrails.  First and last steps.  Where the edge of each step is.  Use tools that help you move around (mobility aids) if they are needed. These include:  Canes.  Walkers.  Scooters.  Crutches.  Turn on the lights when you go into a dark area. Replace any light bulbs as soon as they burn out.  Set up your furniture so you have a clear path. Avoid moving your furniture around.  If any of your floors are uneven, fix them.  If there are any pets around you, be aware of where they are.  Review your medicines with your doctor. Some medicines can make you feel dizzy. This can increase your chance of falling. Ask your doctor what other things that you can do to help prevent falls. This information is not intended to replace advice given to you by your health care provider. Make sure you discuss any questions you have with your health care provider. Document Released: 07/07/2009 Document Revised: 02/16/2016 Document Reviewed: 10/15/2014 Elsevier Interactive Patient Education  2017 [employer].

## 2017-06-13 LAB — HM DEXA SCAN: HM DEXA SCAN: -3.6

## 2017-06-14 ENCOUNTER — Non-Acute Institutional Stay: Payer: Medicare Other | Admitting: Internal Medicine

## 2017-06-14 ENCOUNTER — Encounter: Payer: Self-pay | Admitting: Internal Medicine

## 2017-06-14 DIAGNOSIS — R600 Localized edema: Secondary | ICD-10-CM | POA: Diagnosis not present

## 2017-06-14 DIAGNOSIS — R2681 Unsteadiness on feet: Secondary | ICD-10-CM

## 2017-06-14 DIAGNOSIS — M810 Age-related osteoporosis without current pathological fracture: Secondary | ICD-10-CM | POA: Diagnosis not present

## 2017-06-14 NOTE — Progress Notes (Signed)
Location:  [facility] Room Number: [id number] Place of Service:  ALF [phone]) Provider:  Blanchie Serve MD  Blanchie Serve, MD  Patient Care Team: Blanchie Serve, MD as PCP - General (Internal Medicine) [name], MD (Ophthalmology) [name], MD (Psychiatry) Mast, Man X, NP as Nurse Practitioner (Nurse Practitioner) [name], MD as Consulting Physician (Orthopedic Surgery)  Extended Emergency Contact Information Primary Emergency Contact: [name], [place] Work Phone: [phone] Relation: Son Secondary Emergency Contact: [name] [place] Phone: [phone] Relation: Sister  Code Status: DNR Goals of care: Advanced Directive information Advanced Directives 06/14/2017  Does Patient Have a Medical Advance Directive? Yes  Type of Paramedic of [place];Out of facility DNR (pink MOST or yellow form)  Does patient want to make changes to medical advance directive? No - Patient declined  Copy of [place] in Chart? Yes  Pre-existing out of facility DNR order (yellow form or pink MOST form) Yellow form placed in chart (order not valid for inpatient use);Pink MOST form placed in chart (order not valid for inpatient use)     Chief Complaint  Patient presents with  . Acute Visit    Abnormal dexa scan     HPI:  Pt is a 76 y.o. female seen today for osteoporosis. Her dexa scan resulted with t score of -3.6. She has not had a fall or fracture recently. She is legally blind, gets around with her walker and is at high risk for fall. She has known history of osteoporosis and did not tolerate fosamax in past.   Past Medical History:  Diagnosis Date  . Allergy   . Anemia   . Anxiety   . Asthma   . CAD (coronary artery disease)   . Depression   . Diverticulosis   . Dysfunction of eustachian tube   . Elevated LFTs   . Esophageal reflux     . Esophageal stricture 2002  . Hemorrhoids   . History of rectal polyps   . HLD (hyperlipidemia)   . HTN (hypertension)   . Hypertonicity of bladder   . Irritable bowel syndrome with constipation   . Obesity   . Optic neuritis   . Osteoporosis   . Peptic ulcer disease   . Peripheral neuropathy   . Trigeminal neuralgia    prior injury  . 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 Vitamin D deficiency    Past Surgical History:  Procedure Laterality Date  . ABDOMINAL HYSTERECTOMY    . ANAL RECTAL MANOMETRY N/A 07/05/2014   Procedure: ANO RECTAL MANOMETRY;  Surgeon: Leighton Ruff, MD;  Location: [facility];  Service: Endoscopy;  Laterality: N/A;  . APPENDECTOMY    . CAROTID ENDARTERECTOMY    . COLONOSCOPY  2008, 2015  . TONSILLECTOMY    . UPPER GASTROINTESTINAL ENDOSCOPY  2002    Allergies  Allergen Reactions  . Azithromycin   . Biaxin [Clarithromycin]   . Clarithromycin   . Doxycycline   . Fosamax [Alendronate Sodium]   . Geodon [Ziprasidone Hydrochloride]   . Lipitor [Atorvastatin Calcium]   . Penicillins   . Pravachol [Pravastatin Sodium]   . Statins   . Sulfonamide Derivatives   . Zocor [Simvastatin]     Outpatient Encounter Prescriptions as of 06/14/2017  Medication Sig  . Acetaminophen (TYLENOL ARTHRITIS EXT  RELIEF PO) Take 650 mg by mouth at bedtime as needed.  Marland Kitchen acetaminophen (TYLENOL) 325 MG tablet Take 650 mg by mouth every 6 (six) hours as needed.   Marland Kitchen acetaminophen (TYLENOL) 650 MG CR tablet Take 650 mg by mouth 2 (two) times daily.   Marland Kitchen alum & mag hydroxide-simeth (MYLANTA) 200-200-20 MG/5ML suspension Take 30 mLs by mouth every 4 (four) hours as needed for indigestion or heartburn (in addition to the scheduled dose).   Marland Kitchen alum & mag hydroxide-simeth (MYLANTA) 200-200-20 MG/5ML suspension Take 30 mLs by mouth at bedtime.  Marland Kitchen amitriptyline (ELAVIL) 50 MG tablet Take 50 mg by mouth at bedtime.  Marland Kitchen  aspirin 81 MG EC tablet Take 81 mg by mouth daily.    . bisacodyl (BISACODYL) 5 MG EC tablet Take 5 mg by mouth daily as needed.   . bisacodyl (DULCOLAX) 10 MG suppository Place 10 mg rectally daily as needed for moderate constipation.  . calcium-vitamin D (OSCAL WITH D) 250-125 MG-UNIT tablet Take 1 tablet by mouth 2 (two) times daily. Reported on 12/27/2015  . carbamide peroxide (DEBROX) 6.5 % otic solution Place 1-2 drops into both ears. Once a day on Wednesday  . chlordiazePOXIDE (LIBRIUM) 25 MG capsule Take 25 mg by mouth daily as needed (in addition to the scheduled dose at bedtime).   . chlordiazePOXIDE (LIBRIUM) 25 MG capsule Take 25 mg by mouth at bedtime.  . cyclobenzaprine (FLEXERIL) 5 MG tablet Take 5 mg by mouth 3 (three) times daily as needed for muscle spasms.  . famotidine (PEPCID) 20 MG tablet Take 20 mg by mouth at bedtime.  . fluPHENAZine (PROLIXIN) 1 MG tablet Take 1 mg by mouth at bedtime.  . fluticasone (FLONASE) 50 MCG/ACT nasal spray Place 1 spray into both nostrils 2 (two) times daily.  . furosemide (LASIX) 20 MG tablet Take 20 mg by mouth daily.   Marland Kitchen HYDROcodone-acetaminophen (NORCO/VICODIN) 5-325 MG tablet Take 1 tablet by mouth daily.  Marland Kitchen HYDROcodone-acetaminophen (NORCO/VICODIN) 5-325 MG tablet Take 1 tablet by mouth daily as needed for moderate pain.  . hydrocortisone 2.5 % cream Apply 1 application topically 2 (two) times daily as needed.  . hydrocortisone cream 1 % Apply 1 application topically 2 (two) times daily.  . Linaclotide (LINZESS) 290 MCG CAPS capsule Take 290 mcg by mouth daily.  Marland Kitchen LORazepam (ATIVAN) 0.5 MG tablet Take one tablet by mouth twice daily for anxiety  . magnesium hydroxide (MILK OF MAGNESIA) 400 MG/5ML suspension Take 30 mLs by mouth at bedtime.   . mometasone (ELOCON) 0.1 % cream Apply 1 application topically 2 (two) times daily as needed. To face  . Multiple Vitamins-Minerals (CENTRUM SILVER PO) Take 1 tablet by mouth.    Marland Kitchen omeprazole  (PRILOSEC) 10 MG capsule Take 10 mg by mouth daily.  . polyvinyl alcohol (LIQUIFILM TEARS) 1.4 % ophthalmic solution Place 1 drop into both eyes 3 (three) times daily.  Marland Kitchen saccharomyces boulardii (FLORASTOR) 250 MG capsule Take 250 mg by mouth daily.   . sodium phosphate Pediatric (FLEET) 3.5-9.5 GM/59ML enema Place 1 enema rectally. Insert one dose every three days  . spironolactone (ALDACTONE) 12.5 mg TABS tablet Take 12.5 mg by mouth daily.  . [DISCONTINUED] acetaminophen (TYLENOL) 325 MG tablet Take 650 mg by mouth at bedtime.   . [DISCONTINUED] HYDROcodone-acetaminophen (NORCO/VICODIN) 5-325 MG tablet Take one tablet by mouth twice daily for pain   No facility-administered encounter medications on file as of 06/14/2017.     Review of Systems  Constitutional: Negative  for appetite change, fatigue and fever.  HENT: Negative for mouth sores, postnasal drip, sore throat and trouble swallowing.   Eyes: Positive for visual disturbance.  Respiratory: Negative for cough and shortness of breath.   Cardiovascular: Negative for chest pain and leg swelling.  Gastrointestinal: Negative for abdominal pain, blood in stool, diarrhea, nausea and vomiting.  Genitourinary: Negative for dysuria.  Musculoskeletal: Positive for arthralgias and gait problem.       Unsteady gait  Skin: Negative for rash.  Neurological: Negative for seizures and headaches.  Hematological: Does not bruise/bleed easily.    Immunization History  Administered Date(s) Administered  . Influenza Split 07/23/2013  . Influenza Whole 07/05/2009  . Influenza-Unspecified 07/29/2014, 06/14/2015, 07/11/2016  . PPD Test 06/17/2013  . Pneumococcal Polysaccharide-23 05/05/2009  . Td 05/05/2009  . Zoster 01/09/2011   Pertinent  Health Maintenance Due  Topic Date Due  . INFLUENZA VACCINE  06/24/2017 (Originally 04/24/2017)  . PNA vac Low Risk Adult (2 of 2 - PCV13) 07/25/2017 (Originally 05/05/2010)  . DEXA SCAN  Completed   Fall Risk   06/04/2017 07/29/2014  Falls in the past year? Yes No  Number falls in past yr: 1 -  Injury with Fall? No -   Functional Status Survey:    Vitals:   06/14/17 1414  BP: 132/78  Pulse: 78  Resp: 18  Temp: 97.8 F (36.6 C)  TempSrc: Oral  Weight: 150 lb 9.6 oz (68.3 kg)  Height: 5\' 2"  (1.575 m)   Body mass index is 27.55 kg/m. Physical Exam  Constitutional: She is oriented to person, place, and time. She appears well-developed and well-nourished.  HENT:  Head: Normocephalic and atraumatic.  Mouth/Throat: Oropharynx is clear and moist.  Eyes: Conjunctivae are normal.  Neck: Normal range of motion. Neck supple.  Cardiovascular: Normal rate and regular rhythm.   Pulmonary/Chest: Effort normal and breath sounds normal.  Abdominal: Soft. Bowel sounds are normal. There is no tenderness.  Musculoskeletal: She exhibits edema.  Can move all 4 extremities, uses a walker, trace edema to right and 1+ around left ankle  Lymphadenopathy:    She has no cervical adenopathy.  Neurological: She is alert and oriented to person, place, and time.  Skin: Skin is warm and dry. No rash noted.  Psychiatric: She has a normal mood and affect.    Labs reviewed:  Recent Labs  07/26/16 12/20/16 1644 02/05/17 03/21/17  NA 141  --  139 137  K 4.2  --   --  4.4  BUN 16 15 14 17   CREATININE 0.9 1.01 1.1 1.1    Recent Labs  07/26/16 02/05/17  AST 24 24  ALT 17 15  ALKPHOS 124 99    Recent Labs  07/26/16 02/05/17  WBC 7.6 6.2  HGB 12.1 11.4*  HCT 36 35*  PLT 306 337   Lab Results  Component Value Date   TSH 2.55 05/28/2017   Lab Results  Component Value Date   HGBA1C 5.9 02/05/2017   Lab Results  Component Value Date   CHOL 155 04/04/2013   HDL 53 04/04/2013   LDLCALC 80 04/04/2013   LDLDIRECT 190.0 03/24/2012   TRIG 108 04/04/2013   CHOLHDL 4 03/24/2012    Significant Diagnostic Results in last 30 days:  No results found.  Assessment/Plan  Osteoporosis No old dexa  scan available to compare. t score in this dexa is -3.6. Has not tolerated fosamax in past. Has history of fracture. Reviewed renal function from 01/2017. Start prolia  injection 60 mg  q6 month and monitr. Repeat dexa scan in 2 years. Continue calcium and vitamin d supplement.   Unsteady gait To use walker. Fall precautions.   Leg edema Good dp, no signs of infection. To keep legs elevated at rest and add ted stockings.   Family/ staff Communication: reviewed care plan with patient and charge nurse.    Labs/tests ordered:  dexa in 2 years  Blanchie Serve, MD Internal Medicine [facility] [address] [place], [place] [address] Cell Phone (Monday-Friday 8 am - 5 pm): [phone] On Call: [phone] and follow prompts after 5 pm and on weekends Office Phone: [phone] Office Fax: [phone]

## 2017-08-01 ENCOUNTER — Encounter: Payer: Self-pay | Admitting: Nurse Practitioner

## 2017-08-01 ENCOUNTER — Non-Acute Institutional Stay: Payer: Medicare Other | Admitting: Nurse Practitioner

## 2017-08-01 DIAGNOSIS — L03011 Cellulitis of right finger: Secondary | ICD-10-CM | POA: Diagnosis not present

## 2017-08-01 DIAGNOSIS — M25561 Pain in right knee: Secondary | ICD-10-CM | POA: Diagnosis not present

## 2017-08-01 DIAGNOSIS — R609 Edema, unspecified: Secondary | ICD-10-CM | POA: Diagnosis not present

## 2017-08-01 DIAGNOSIS — M25562 Pain in left knee: Secondary | ICD-10-CM

## 2017-08-01 DIAGNOSIS — G8929 Other chronic pain: Secondary | ICD-10-CM

## 2017-08-01 NOTE — Assessment & Plan Note (Addendum)
pain, erythema noted in lateral of the right 2nd finger nail x 2 weeks, no apparent drainage, denied trauma, no better on Neosporin OTC ointment. Will apply Diclofenac gel 1% qid x 2weeks. Observe. Update CBC/diff, uric acid, BMP

## 2017-08-01 NOTE — Progress Notes (Signed)
Location:  [facility] Number: [phone] Place of Service:  SNF ([phone]) Provider:  Feliberto Stockley, [place]  NP  Blanchie Serve, MD  Patient Care Team: Blanchie Serve, MD as PCP - General (Internal Medicine) [name], MD (Ophthalmology) [name], MD (Psychiatry) Joyel Chenette X, NP as Nurse Practitioner (Nurse Practitioner) [name], MD as Consulting Physician (Orthopedic Surgery)  Extended Emergency Contact Information Primary Emergency Contact: [name], [place] of [place] Work Phone: [phone] Relation: Son Secondary Emergency Contact: [name] [place] of [place] Phone: [phone] Relation: Sister  Code Status:  DNR Goals of care: Advanced Directive information Advanced Directives 06/14/2017  Does Patient Have a Medical Advance Directive? Yes  Type of Paramedic of [place];Out of facility DNR (pink MOST or yellow form)  Does patient want to make changes to medical advance directive? No - Patient declined  Copy of [place] in Chart? Yes  Pre-existing out of facility DNR order (yellow form or pink MOST form) Yellow form placed in chart (order not valid for inpatient use);Pink MOST form placed in chart (order not valid for inpatient use)     Chief Complaint  Patient presents with  . Acute Visit    pain in the pointer finger    HPI:  Pt is a 76 y.o. female seen today for an acute visit for pain, erythema noted in lateral of the right 2nd finger nail x 2 weeks, no apparent drainage, denied trauma, no better on Neosporin OTC ointment. She takes Norco daily for knee pain, controlled, ambulates with walker and transfer independently.     She takes Spironolactone 12.5mg  qd, Furosemide 20mg  qd, trace edema noted in the left ankle, she wears compression hosiery daily.  Past Medical History:  Diagnosis Date  . Allergy   . Anemia   . Anxiety   . Asthma   .  CAD (coronary artery disease)   . Depression   . Diverticulosis   . Dysfunction of eustachian tube   . Elevated LFTs   . Esophageal reflux   . Esophageal stricture 2002  . Hemorrhoids   . History of rectal polyps   . HLD (hyperlipidemia)   . HTN (hypertension)   . Hypertonicity of bladder   . Irritable bowel syndrome with constipation   . Obesity   . Optic neuritis   . Osteoporosis   . Peptic ulcer disease   . Peripheral neuropathy   . Trigeminal neuralgia    prior injury  . 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 Vitamin D deficiency    Past Surgical History:  Procedure Laterality Date  . ABDOMINAL HYSTERECTOMY    . APPENDECTOMY    . CAROTID ENDARTERECTOMY    . COLONOSCOPY  2008, 2015  . TONSILLECTOMY    . UPPER GASTROINTESTINAL ENDOSCOPY  2002    Allergies  Allergen Reactions  . Azithromycin   . Biaxin [Clarithromycin]   . Clarithromycin   . Doxycycline   . Fosamax [Alendronate Sodium]   . Geodon [Ziprasidone Hydrochloride]   . Lipitor [Atorvastatin Calcium]   . Penicillins   . Pravachol [Pravastatin Sodium]   . Statins   . Sulfonamide Derivatives   . Zocor [Simvastatin]     Outpatient Encounter Medications as of 08/01/2017  Medication Sig  . Acetaminophen (TYLENOL ARTHRITIS EXT RELIEF PO) Take 650 mg by mouth at  bedtime as needed.  Marland Kitchen acetaminophen (TYLENOL) 325 MG tablet Take 650 mg by mouth every 6 (six) hours as needed.   Marland Kitchen acetaminophen (TYLENOL) 650 MG CR tablet Take 650 mg by mouth 2 (two) times daily.   Marland Kitchen alum & mag hydroxide-simeth (MYLANTA) 200-200-20 MG/5ML suspension Take 30 mLs by mouth every 4 (four) hours as needed for indigestion or heartburn (in addition to the scheduled dose).   Marland Kitchen alum & mag hydroxide-simeth (MYLANTA) 200-200-20 MG/5ML suspension Take 30 mLs by mouth at bedtime.  Marland Kitchen amitriptyline (ELAVIL) 50 MG tablet Take 50 mg by mouth at bedtime.  Marland Kitchen aspirin 81 MG EC tablet Take 81  mg by mouth daily.    . bisacodyl (BISACODYL) 5 MG EC tablet Take 5 mg by mouth daily as needed.   . bisacodyl (DULCOLAX) 10 MG suppository Place 10 mg rectally daily as needed for moderate constipation.  . calcium-vitamin D (OSCAL WITH D) 250-125 MG-UNIT tablet Take 1 tablet by mouth 2 (two) times daily. Reported on 12/27/2015  . carbamide peroxide (DEBROX) 6.5 % otic solution Place 1-2 drops into both ears. Once a day on Wednesday  . chlordiazePOXIDE (LIBRIUM) 25 MG capsule Take 25 mg by mouth daily as needed (in addition to the scheduled dose at bedtime).   . cyclobenzaprine (FLEXERIL) 5 MG tablet Take 5 mg by mouth 3 (three) times daily as needed for muscle spasms.  Marland Kitchen denosumab (PROLIA) 60 MG/ML SOLN injection Inject 60 mg every 6 (six) months into the skin. Administer in upper arm, thigh, or abdomen  . famotidine (PEPCID) 20 MG tablet Take 20 mg by mouth at bedtime.  . fluPHENAZine (PROLIXIN) 1 MG tablet Take 1 mg by mouth at bedtime.  . fluticasone (FLONASE) 50 MCG/ACT nasal spray Place 1 spray into both nostrils 2 (two) times daily.  . furosemide (LASIX) 20 MG tablet Take 20 mg by mouth daily.   Marland Kitchen HYDROcodone-acetaminophen (NORCO/VICODIN) 5-325 MG tablet Take 1 tablet by mouth daily.  Marland Kitchen HYDROcodone-acetaminophen (NORCO/VICODIN) 5-325 MG tablet Take 1 tablet by mouth daily as needed for moderate pain.  . hydrocortisone 2.5 % cream Apply 1 application topically 2 (two) times daily as needed.  . hydrocortisone cream 1 % Apply 1 application topically 2 (two) times daily.  . Linaclotide (LINZESS) 290 MCG CAPS capsule Take 290 mcg by mouth daily.  Marland Kitchen LORazepam (ATIVAN) 0.5 MG tablet Take one tablet by mouth twice daily for anxiety  . magnesium hydroxide (MILK OF MAGNESIA) 400 MG/5ML suspension Take 30 mLs by mouth at bedtime.   . mometasone (ELOCON) 0.1 % cream Apply 1 application topically 2 (two) times daily as needed. To face  . Multiple Vitamins-Minerals (CENTRUM SILVER PO) Take 1 tablet by  mouth.    Marland Kitchen omeprazole (PRILOSEC) 10 MG capsule Take 10 mg by mouth daily.  . polyvinyl alcohol (LIQUIFILM TEARS) 1.4 % ophthalmic solution Place 1 drop into both eyes 3 (three) times daily.  Marland Kitchen saccharomyces boulardii (FLORASTOR) 250 MG capsule Take 250 mg by mouth daily.   . sodium phosphate Pediatric (FLEET) 3.5-9.5 GM/59ML enema Place 1 enema rectally. Insert one dose every three days  . spironolactone (ALDACTONE) 12.5 mg TABS tablet Take 12.5 mg by mouth daily.  . [DISCONTINUED] chlordiazePOXIDE (LIBRIUM) 25 MG capsule Take 25 mg by mouth at bedtime.   No facility-administered encounter medications on file as of 08/01/2017.     Review of Systems  Constitutional: Negative for activity change, appetite change, chills, diaphoresis, fatigue and fever.  Respiratory: Negative for cough,  choking, chest tightness and shortness of breath.   Cardiovascular: Positive for leg swelling. Negative for chest pain and palpitations.  Musculoskeletal: Positive for arthralgias, gait problem and joint swelling. Negative for back pain, myalgias, neck pain and neck stiffness.       Pain, erythema noted in lateral of the right 2nd finger nail x 2 weeks, no apparent drainage, denied trauma, no better on Neosporin OTC ointment.     Psychiatric/Behavioral: Negative for agitation, behavioral problems and confusion.    Immunization History  Administered Date(s) Administered  . Influenza Split 07/23/2013  . Influenza Whole 07/05/2009  . Influenza-Unspecified 07/29/2014, 06/14/2015, 07/11/2016  . PPD Test 06/17/2013  . Pneumococcal Polysaccharide-23 05/05/2009  . Td 05/05/2009  . Zoster 01/09/2011   Pertinent  Health Maintenance Due  Topic Date Due  . PNA vac Low Risk Adult (2 of 2 - PCV13) 05/05/2010  . INFLUENZA VACCINE  04/24/2017  . DEXA SCAN  Completed   Fall Risk  06/04/2017 07/29/2014  Falls in the past year? Yes No  Number falls in past yr: 1 -  Injury with Fall? No -   Functional Status  Survey:    Vitals:   08/01/17 1130  BP: 136/70  Pulse: 74  Resp: 18  Temp: 97.8 F (36.6 C)  Weight: 148 lb (67.1 kg)  Height: 5\' 2"  (1.575 m)   Body mass index is 27.07 kg/m. Physical Exam  Constitutional: She is oriented to person, place, and time. She appears well-developed and well-nourished.  HENT:  Head: Normocephalic and atraumatic.  Cardiovascular: Normal rate and regular rhythm.  No murmur heard. Pulmonary/Chest: She has no wheezes. She has no rales.  Musculoskeletal: Normal range of motion. She exhibits edema and tenderness. She exhibits no deformity.  pain, erythema noted in lateral of the right 2nd finger nail x 2 weeks, no apparent drainage, no deformity or decreases ROM, denied trauma, no better on Neosporin OTC ointment. Trace edema in the left ankle    Neurological: She is alert and oriented to person, place, and time.  Skin: Skin is warm and dry. No rash noted. There is erythema. No pallor.  The right lateral aspect of the 2nd finger next to the nail area erythema and painful when examined  Psychiatric: She has a normal mood and affect. Her behavior is normal.    Labs reviewed: Recent Labs    12/20/16 1644 02/05/17 03/21/17  NA  --  139 137  K  --   --  4.4  BUN 15 14 17   CREATININE 1.01 1.1 1.1   Recent Labs    02/05/17  AST 24  ALT 15  ALKPHOS 99   Recent Labs    02/05/17  WBC 6.2  HGB 11.4*  HCT 35*  PLT 337   Lab Results  Component Value Date   TSH 2.55 05/28/2017   Lab Results  Component Value Date   HGBA1C 5.9 02/05/2017   Lab Results  Component Value Date   CHOL 155 04/04/2013   HDL 53 04/04/2013   LDLCALC 80 04/04/2013   LDLDIRECT 190.0 03/24/2012   TRIG 108 04/04/2013   CHOLHDL 4 03/24/2012    Significant Diagnostic Results in last 30 days:  No results found.  Assessment/Plan Paronychia of finger, right pain, erythema noted in lateral of the right 2nd finger nail x 2 weeks, no apparent drainage, denied trauma, no  better on Neosporin OTC ointment. Will apply Diclofenac gel 1% qid x 2weeks. Observe. Update CBC/diff, uric acid, BMP  PERIPHERAL EDEMA She takes Spironolactone 12.5mg  qd, Furosemide 20mg  qd, trace edema noted in the left ankle, she wears compression hosiery daily.    Chronic pain of both knees She takes Norco daily for knee pain, controlled, ambulates with walker and transfer independently.       Family/ staff Communication: plan of care reviewed with the patient and charge nurse  Labs/tests ordered: CBC/diff, BMP, uric acid.   Time spend 25 minutes.

## 2017-08-01 NOTE — Assessment & Plan Note (Signed)
She takes Spironolactone 12.5mg  qd, Furosemide 20mg  qd, trace edema noted in the left ankle, she wears compression hosiery daily.

## 2017-08-01 NOTE — Assessment & Plan Note (Signed)
She takes Norco daily for knee pain, controlled, ambulates with walker and transfer independently.

## 2017-08-21 ENCOUNTER — Encounter: Payer: Self-pay | Admitting: Nurse Practitioner

## 2017-08-21 ENCOUNTER — Non-Acute Institutional Stay: Payer: Medicare Other | Admitting: Nurse Practitioner

## 2017-08-21 DIAGNOSIS — G8929 Other chronic pain: Secondary | ICD-10-CM

## 2017-08-21 DIAGNOSIS — R609 Edema, unspecified: Secondary | ICD-10-CM

## 2017-08-21 DIAGNOSIS — K5909 Other constipation: Secondary | ICD-10-CM

## 2017-08-21 DIAGNOSIS — K219 Gastro-esophageal reflux disease without esophagitis: Secondary | ICD-10-CM

## 2017-08-21 DIAGNOSIS — N318 Other neuromuscular dysfunction of bladder: Secondary | ICD-10-CM | POA: Diagnosis not present

## 2017-08-21 DIAGNOSIS — M25562 Pain in left knee: Secondary | ICD-10-CM

## 2017-08-21 DIAGNOSIS — F418 Other specified anxiety disorders: Secondary | ICD-10-CM

## 2017-08-21 DIAGNOSIS — M25561 Pain in right knee: Secondary | ICD-10-CM | POA: Diagnosis not present

## 2017-08-21 DIAGNOSIS — M8000XA Age-related osteoporosis with current pathological fracture, unspecified site, initial encounter for fracture: Secondary | ICD-10-CM

## 2017-08-21 NOTE — Progress Notes (Signed)
Location:  [facility] Room Number: [id number] Place of Service:  ALF [phone]) Provider:  Dionicio Shelnutt, Manxie  NP  Blanchie Serve, MD  Patient Care Team: Blanchie Serve, MD as PCP - General (Internal Medicine) [name], MD (Ophthalmology) [name], MD (Psychiatry) Dayton Kenley X, NP as Nurse Practitioner (Nurse Practitioner) [name], MD as Consulting Physician (Orthopedic Surgery)  Extended Emergency Contact Information Primary Emergency Contact: [name], [place] of [place] Work Phone: [phone] Relation: Son Secondary Emergency Contact: [name] [place] of [place] Phone: [phone] Relation: Sister  Code Status:  DNR Goals of care: Advanced Directive information Advanced Directives 08/21/2017  Does Patient Have a Medical Advance Directive? Yes  Type of Paramedic of [place];Out of facility DNR (pink MOST or yellow form)  Does patient want to make changes to medical advance directive? No - Patient declined  Copy of [place] in Chart? Yes  Pre-existing out of facility DNR order (yellow form or pink MOST form) Yellow form placed in chart (order not valid for inpatient use);Pink MOST form placed in chart (order not valid for inpatient use)     Chief Complaint  Patient presents with  . Medical Management of Chronic Issues    HPI:  Pt is a 76 y.o. female seen today for medical management of chronic diseases.     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 Edema in legs, mild, on Furosemide 20mg  qd, Spironolactone 12.5mg  qd.    Past Medical History:  Diagnosis Date  . Allergy   . Anemia   . Anxiety   . Asthma   . CAD (coronary artery disease)   . Depression   . Diverticulosis   . Dysfunction of eustachian tube   . Elevated LFTs   . Esophageal reflux   . Esophageal stricture 2002  . Hemorrhoids   . History of rectal polyps   . HLD (hyperlipidemia)   . HTN (hypertension)   . Hypertonicity of bladder   . Irritable bowel syndrome with constipation   . Obesity   . Optic neuritis   . Osteoporosis   . Peptic ulcer disease   . Peripheral neuropathy   . Trigeminal neuralgia    prior injury  . 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 Vitamin D deficiency    Past Surgical History:  Procedure Laterality Date  . ABDOMINAL HYSTERECTOMY    . ANAL RECTAL MANOMETRY N/A 07/05/2014   Procedure: ANO RECTAL MANOMETRY;  Surgeon: Leighton Ruff, MD;  Location: [facility];  Service: Endoscopy;  Laterality: N/A;  . APPENDECTOMY    . CAROTID ENDARTERECTOMY    . COLONOSCOPY  2008, 2015  . TONSILLECTOMY    . UPPER GASTROINTESTINAL ENDOSCOPY  2002    Allergies  Allergen Reactions  . Azithromycin   .  Biaxin [Clarithromycin]   . Clarithromycin   . Doxycycline   . Fosamax [Alendronate Sodium]   . Geodon [Ziprasidone Hydrochloride]   . Lipitor [Atorvastatin Calcium]   . Penicillins   . Pravachol [Pravastatin Sodium]   . Statins   . Sulfonamide Derivatives   . Zocor [Simvastatin]     Outpatient Encounter Medications as of 08/21/2017  Medication Sig  . Acetaminophen (TYLENOL ARTHRITIS EXT RELIEF PO) Take 650 mg by mouth at bedtime as needed.  Marland Kitchen acetaminophen (TYLENOL) 325 MG tablet Take 650 mg by mouth every 6 (six) hours as  needed.   Marland Kitchen acetaminophen (TYLENOL) 650 MG CR tablet Take 650 mg by mouth 2 (two) times daily.   Marland Kitchen alum & mag hydroxide-simeth (MYLANTA) 200-200-20 MG/5ML suspension Take 30 mLs by mouth every 4 (four) hours as needed for indigestion or heartburn (in addition to the scheduled dose).   Marland Kitchen alum & mag hydroxide-simeth (MYLANTA) 200-200-20 MG/5ML suspension Take 30 mLs by mouth at bedtime.  Marland Kitchen amitriptyline (ELAVIL) 50 MG tablet Take 50 mg by mouth at bedtime.  Marland Kitchen aspirin 81 MG EC tablet Take 81 mg by mouth daily.    . bisacodyl (BISACODYL) 5 MG EC tablet Take 5 mg by mouth daily as needed.   . bisacodyl (DULCOLAX) 10 MG suppository Place 10 mg rectally daily as needed for moderate constipation.  . calcium-vitamin D (OSCAL WITH D) 250-125 MG-UNIT tablet Take 1 tablet by mouth 2 (two) times daily. Reported on 12/27/2015  . carbamide peroxide (DEBROX) 6.5 % otic solution Place 1-2 drops into both ears. Once a day on Wednesday  . chlordiazePOXIDE (LIBRIUM) 25 MG capsule Take 25 mg by mouth daily as needed (in addition to the scheduled dose at bedtime).   . cyclobenzaprine (FLEXERIL) 5 MG tablet Take 5 mg by mouth 3 (three) times daily as needed for muscle spasms.  Marland Kitchen denosumab (PROLIA) 60 MG/ML SOLN injection Inject 60 mg every 6 (six) months into the skin. Administer in upper arm, thigh, or abdomen  . famotidine (PEPCID) 20 MG tablet Take 20 mg by mouth at bedtime.  . fluPHENAZine (PROLIXIN) 1 MG tablet Take 1 mg by mouth at bedtime.  . fluticasone (FLONASE) 50 MCG/ACT nasal spray Place 1 spray into both nostrils 2 (two) times daily.  . furosemide (LASIX) 20 MG tablet Take 20 mg by mouth daily.   Marland Kitchen HYDROcodone-acetaminophen (NORCO/VICODIN) 5-325 MG tablet Take 1 tablet by mouth daily.  Marland Kitchen HYDROcodone-acetaminophen (NORCO/VICODIN) 5-325 MG tablet Take 1 tablet by mouth daily as needed for severe pain.   . hydrocortisone 2.5 % cream Apply 1 application topically 2 (two) times daily as needed.  . hydrocortisone  cream 1 % Apply 1 application topically 2 (two) times daily.  . Linaclotide (LINZESS) 290 MCG CAPS capsule Take 290 mcg by mouth daily.  Marland Kitchen LORazepam (ATIVAN) 0.5 MG tablet Take one tablet by mouth twice daily for anxiety  . magnesium hydroxide (MILK OF MAGNESIA) 400 MG/5ML suspension Take 30 mLs by mouth at bedtime.   . Menthol, Topical Analgesic, (BIOFREEZE) 4 % GEL Apply topically 4 (four) times daily. Apply to right 2nd finger x 2 weeks.  . mometasone (ELOCON) 0.1 % cream Apply 1 application topically 2 (two) times daily as needed. To face  . Multiple Vitamins-Minerals (CENTRUM SILVER PO) Take 1 tablet by mouth.    Marland Kitchen omeprazole (PRILOSEC) 10 MG capsule Take 10 mg by mouth daily.  . polyvinyl alcohol (LIQUIFILM TEARS) 1.4 % ophthalmic solution Place 1 drop into both eyes  3 (three) times daily.  Marland Kitchen saccharomyces boulardii (FLORASTOR) 250 MG capsule Take 250 mg by mouth daily.   Marland Kitchen spironolactone (ALDACTONE) 12.5 mg TABS tablet Take 12.5 mg by mouth daily.  Marland Kitchen tolterodine (DETROL LA) 4 MG 24 hr capsule Take 4 mg by mouth daily.  . [DISCONTINUED] sodium phosphate Pediatric (FLEET) 3.5-9.5 GM/59ML enema Place 1 enema rectally. Insert one dose every three days   No facility-administered encounter medications on file as of 08/21/2017.     Review of Systems  Constitutional: Negative for activity change, appetite change, chills, diaphoresis, fatigue and fever.  HENT: Positive for hearing loss. Negative for congestion, trouble swallowing and voice change.   Eyes: Negative for visual disturbance.  Respiratory: Negative for cough, chest tightness, shortness of breath and wheezing.   Cardiovascular: Negative for chest pain, palpitations and leg swelling.  Gastrointestinal: Negative for abdominal distention, abdominal pain, constipation, diarrhea, nausea and vomiting.  Endocrine: Negative for cold intolerance.  Genitourinary: Negative for difficulty urinating, dysuria, frequency and urgency.    Musculoskeletal: Positive for gait problem. Negative for arthralgias, back pain, joint swelling and myalgias.       Knee pain, L>R, ambulates with walker.   Skin: Negative for pallor, rash and wound.  Neurological: Negative for tremors, speech difficulty, weakness and headaches.  Psychiatric/Behavioral: Negative for agitation, behavioral problems, confusion, hallucinations and sleep disturbance. The patient is not nervous/anxious.     Immunization History  Administered Date(s) Administered  . Influenza Split 07/23/2013  . Influenza Whole 07/05/2009  . Influenza-Unspecified 07/29/2014, 06/14/2015, 07/11/2016, 07/15/2017  . PPD Test 06/17/2013  . Pneumococcal Conjugate-13 07/22/2017  . Pneumococcal Polysaccharide-23 05/05/2009  . Td 05/05/2009  . Zoster 01/09/2011   Pertinent  Health Maintenance Due  Topic Date Due  . INFLUENZA VACCINE  Completed  . DEXA SCAN  Completed  . PNA vac Low Risk Adult  Completed   Fall Risk  06/04/2017 07/29/2014  Falls in the past year? Yes No  Number falls in past yr: 1 -  Injury with Fall? No -   Functional Status Survey:    Vitals:   08/21/17 1328  BP: 132/64  Pulse: 72  Resp: 20  Temp: 97.7 F (36.5 C)  SpO2: 95%  Weight: 148 lb (67.1 kg)  Height: 5\' 2"  (1.575 m)   Body mass index is 27.07 kg/m. Physical Exam  Constitutional: She is oriented to person, place, and time. She appears well-developed and well-nourished.  HENT:  Head: Normocephalic and atraumatic.  Eyes: Conjunctivae and EOM are normal. Pupils are equal, round, and reactive to light.  Neck: Normal range of motion. Neck supple. No JVD present. No thyromegaly present.  Cardiovascular: Normal rate and regular rhythm.  No murmur heard. Pulmonary/Chest: Effort normal and breath sounds normal. She has no wheezes. She has no rales.  Abdominal: Soft. Bowel sounds are normal. She exhibits no distension. There is no tenderness.  Genitourinary:  Genitourinary Comments: No  urination at night.   Musculoskeletal: Normal range of motion. She exhibits tenderness. She exhibits no edema.  Knees L>R, s/p left knee inj at Ortho 08/20/17. Ambulates with walker, transfer self.   Neurological: She is alert and oriented to person, place, and time. No cranial nerve deficit. She exhibits normal muscle tone. Coordination normal.  Skin: Skin is warm and dry. No rash noted. No erythema. No pallor.  Psychiatric: She has a normal mood and affect. Her behavior is normal. Judgment and thought content normal.    Labs reviewed: Recent Labs    12/20/16  1644 02/05/17 03/21/17  NA  --  139 137  K  --   --  4.4  BUN 15 14 17   CREATININE 1.01 1.1 1.1   Recent Labs    02/05/17  AST 24  ALT 15  ALKPHOS 99   Recent Labs    02/05/17  WBC 6.2  HGB 11.4*  HCT 35*  PLT 337   Lab Results  Component Value Date   TSH 2.55 05/28/2017   Lab Results  Component Value Date   HGBA1C 5.9 02/05/2017   Lab Results  Component Value Date   CHOL 155 04/04/2013   HDL 53 04/04/2013   LDLCALC 80 04/04/2013   LDLDIRECT 190.0 03/24/2012   TRIG 108 04/04/2013   CHOLHDL 4 03/24/2012    Significant Diagnostic Results in last 30 days:  No results found.  Assessment/Plan Hypertonicity of bladder The patient has history of OAB, on Detrol LA 4mg  qd, stable.   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    PERIPHERAL EDEMA Edema  in legs, mild, continue Furosemide 20mg  qd, Spironolactone 12.5mg  qd. Continue compression hosiery.       Family/ staff Communication: plan of care reviewed with the patient and charge nurse.   Labs/tests ordered:  None   Time spend 25 minutes.

## 2017-08-21 NOTE — Assessment & Plan Note (Signed)
GERD stable on Omeprazole 10mg  qd, Famotidine 20mg  qd.

## 2017-08-21 NOTE — Assessment & Plan Note (Signed)
Edema in legs, mild, continue Furosemide 20mg  qd, Spironolactone 12.5mg  qd. Continue compression hosiery.

## 2017-08-21 NOTE — Assessment & Plan Note (Signed)
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

## 2017-08-21 NOTE — Assessment & Plan Note (Signed)
Osteoporosis: treated with Prolia q6 months, Ca, Vit D

## 2017-08-21 NOTE — Assessment & Plan Note (Signed)
her mood is stable, on Elavil 50mg  qhs, Chordiazepoxide 25mg  daily prn, Lorazepam 0.5mg  bid,  Prolixin 1mg  qhs

## 2017-08-21 NOTE — Assessment & Plan Note (Signed)
The patient has history of OAB, on Detrol LA 4mg  qd, stable.

## 2017-08-21 NOTE — Assessment & Plan Note (Signed)
constipation, managed with MOM 50ml qhs, Bisacodyl 5mg  tab daily prn, 10mg  suppository daily prn

## 2017-12-01 ENCOUNTER — Emergency Department (HOSPITAL_COMMUNITY)
Admission: EM | Admit: 2017-12-01 | Discharge: 2017-12-01 | Disposition: A | Discharge status: Home / Self Care | Payer: Medicare Other | Attending: Emergency Medicine | Admitting: Emergency Medicine

## 2017-12-01 ENCOUNTER — Other Ambulatory Visit: Payer: Self-pay

## 2017-12-01 ENCOUNTER — Emergency Department (HOSPITAL_COMMUNITY): Payer: Medicare Other

## 2017-12-01 ENCOUNTER — Encounter (HOSPITAL_COMMUNITY): Payer: Self-pay

## 2017-12-01 DIAGNOSIS — I2510 Atherosclerotic heart disease of native coronary artery without angina pectoris: Secondary | ICD-10-CM | POA: Insufficient documentation

## 2017-12-01 DIAGNOSIS — R1033 Periumbilical pain: Secondary | ICD-10-CM | POA: Diagnosis present

## 2017-12-01 DIAGNOSIS — I10 Essential (primary) hypertension: Secondary | ICD-10-CM | POA: Diagnosis not present

## 2017-12-01 DIAGNOSIS — Z79899 Other long term (current) drug therapy: Secondary | ICD-10-CM | POA: Insufficient documentation

## 2017-12-01 DIAGNOSIS — K5900 Constipation, unspecified: Secondary | ICD-10-CM | POA: Insufficient documentation

## 2017-12-01 LAB — COMPREHENSIVE METABOLIC PANEL
ALBUMIN: 4.2 g/dL (ref 3.5–5.0)
ALK PHOS: 117 U/L (ref 38–126)
ALT: 19 U/L (ref 14–54)
ANION GAP: 9 (ref 5–15)
AST: 29 U/L (ref 15–41)
BILIRUBIN TOTAL: 0.4 mg/dL (ref 0.3–1.2)
BUN: 19 mg/dL (ref 6–20)
CALCIUM: 9.5 mg/dL (ref 8.9–10.3)
CO2: 27 mmol/L (ref 22–32)
CREATININE: 1.18 mg/dL — AB (ref 0.44–1.00)
Chloride: 105 mmol/L (ref 101–111)
GFR calc Af Amer: 50 mL/min — ABNORMAL LOW (ref >60)
GFR calc non Af Amer: 43 mL/min — ABNORMAL LOW (ref >60)
GLUCOSE: 95 mg/dL (ref 65–99)
Potassium: 4 mmol/L (ref 3.5–5.1)
SODIUM: 141 mmol/L (ref 135–145)
Total Protein: 8.1 g/dL (ref 6.5–8.1)

## 2017-12-01 LAB — CBC WITH DIFFERENTIAL/PLATELET
BASOS PCT: 1 %
Basophils Absolute: 0.1 10*3/uL (ref 0.0–0.1)
EOS PCT: 3 %
Eosinophils Absolute: 0.2 10*3/uL (ref 0.0–0.7)
HEMATOCRIT: 43.9 % (ref 36.0–46.0)
Hemoglobin: 14.2 g/dL (ref 12.0–15.0)
Lymphocytes Relative: 42 %
Lymphs Abs: 2.2 10*3/uL (ref 0.7–4.0)
MCH: 30.1 pg (ref 26.0–34.0)
MCHC: 32.3 g/dL (ref 30.0–36.0)
MCV: 93.2 fL (ref 78.0–100.0)
MONO ABS: 0.5 10*3/uL (ref 0.1–1.0)
Monocytes Relative: 10 %
NEUTROS ABS: 2.3 10*3/uL (ref 1.7–7.7)
Neutrophils Relative %: 44 %
Platelets: 309 10*3/uL (ref 150–400)
RBC: 4.71 MIL/uL (ref 3.87–5.11)
RDW: 14.6 % (ref 11.5–15.5)
WBC: 5.2 10*3/uL (ref 4.0–10.5)

## 2017-12-01 LAB — URINALYSIS, ROUTINE W REFLEX MICROSCOPIC
BILIRUBIN URINE: NEGATIVE
GLUCOSE, UA: NEGATIVE mg/dL
Hgb urine dipstick: NEGATIVE
KETONES UR: NEGATIVE mg/dL
Leukocytes, UA: NEGATIVE
NITRITE: NEGATIVE
PH: 7 (ref 5.0–8.0)
Protein, ur: NEGATIVE mg/dL
Specific Gravity, Urine: 1.015 (ref 1.005–1.030)

## 2017-12-01 MED ORDER — POLYETHYLENE GLYCOL 3350 17 G PO PACK: 17.0000 g | PACK | Freq: Every day | ORAL | 0 refills | Status: DC

## 2017-12-01 NOTE — ED Notes (Signed)
Bed: QJ19 Expected date:  Expected time:  Means of arrival:  Comments: Held for EMS

## 2017-12-01 NOTE — ED Triage Notes (Signed)
Pt comes from Assisted living facility. Pt arrived via [facility]. Pt called 911 due to pain in abdomen. Pt stated she feels like she cant used the restroom. Last time pt urinated was yesterday. RN at facility stated pt did take Lasix this morning however still no urine. Pt is AOx4, Pt is legally blind. Pt is ambulatory with minimal assistance according to Assisted Living facility care RN's.

## 2017-12-01 NOTE — ED Provider Notes (Signed)
[facility] DEPT Provider Note   CSN: [id number] Arrival date & time: 12/01/17  0915     History   Chief Complaint Chief Complaint  Patient presents with  . Abdominal Pain    HPI Jean [name] is a 77 y.o. female.  Pt presents to the ED today with constipation and urinary retention.  Pt also feels like her pelvis is pressing on her bladder.  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  She has last urinated last night.  She is requesting a MRI of her pelvis.  No f/c.  No n/v.      Past Medical History:  Diagnosis Date  . Allergy   . Anemia   . Anxiety   . Asthma   . CAD (coronary artery disease)   . Depression   . Diverticulosis   . Dysfunction of eustachian tube   . Elevated LFTs   . Esophageal reflux   . Esophageal stricture 2002  . Hemorrhoids   . History of rectal polyps   . HLD (hyperlipidemia)   . HTN (hypertension)   . Hypertonicity of bladder   . Irritable bowel syndrome with constipation   . Obesity   . Optic neuritis   . Osteoporosis   . Peptic ulcer disease   . Peripheral neuropathy   . Trigeminal neuralgia    prior injury  . 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 Vitamin D deficiency     Patient Active Problem List   Diagnosis Date Noted  . Therapeutic opioid induced constipation 05/23/2017  . Chronic pain of both knees 04/15/2016  . UTI (urinary tract infection) 01/27/2015  . Tinnitus of both ears 01/21/2015  . Meniscus tear 01/21/2015  . Rectocele 06/28/2014  . Pelvic floor dysfunction 06/28/2014  . Hemorrhoids 02/11/2014  . Hypertonicity of bladder 04/13/2013  . GERD (gastroesophageal reflux disease) 04/13/2013  . Closed fracture of unspecified part of upper end of humerus 04/02/2013  . Carotid artery disease ([place]) 12/31/2011  . Constipation 02/23/2011  . OPTIC  NEURITIS 05/11/2010  . Overweight 05/05/2009  . Osteoporosis 05/05/2009  . Diverticulosis of large intestine without diverticulitis 12/02/2008  . Irritable bowel syndrome 12/02/2008  . RECTAL POLYPS 12/02/2008  . Abdominal pain, generalized 12/02/2008  . History of muscle pain 08/17/2008  . PERIPHERAL EDEMA 08/17/2008  . VITAMIN D DEFICIENCY 05/19/2008  . HLD (hyperlipidemia) 05/19/2008  . Anxiety state 01/06/2008  . Depression with anxiety 01/06/2008  . Essential hypertension 11/20/2007  . Seasonal and perennial allergic rhinitis 08/01/2007  . Asthma, mild intermittent 08/01/2007    Past Surgical History:  Procedure Laterality Date  . ABDOMINAL HYSTERECTOMY    . ANAL RECTAL MANOMETRY N/A 07/05/2014   Procedure: ANO RECTAL MANOMETRY;  Surgeon: Leighton Ruff, MD;  Location: [facility];  Service: Endoscopy;  Laterality: N/A;  . APPENDECTOMY    . CAROTID ENDARTERECTOMY    . COLONOSCOPY  2008, 2015  . TONSILLECTOMY    . UPPER GASTROINTESTINAL ENDOSCOPY  2002    OB History    No data available       Home Medications    Prior to Admission medications   Medication Sig Start Date End Date Taking? Authorizing Provider  acetaminophen (TYLENOL) 325 MG tablet Take 650 mg by mouth every 6 (six) hours as needed.    Yes  [provider]  acetaminophen (TYLENOL) 650 MG CR tablet Take 650 mg by mouth 2 (two) times daily.    Yes [provider]  alum & mag hydroxide-simeth (MYLANTA) 200-200-20 MG/5ML suspension Take 30 mLs by mouth at bedtime.   Yes [provider]  amitriptyline (ELAVIL) 50 MG tablet Take 50 mg by mouth at bedtime.   Yes [provider]  aspirin 81 MG EC tablet Take 81 mg by mouth daily.     Yes [provider]  calcium-vitamin D (OSCAL WITH D) 250-125 MG-UNIT tablet Take 1 tablet by mouth 2 (two) times daily. Reported on 12/27/2015   Yes [provider]  carbamide peroxide (DEBROX) 6.5 % otic solution Place 1-2 drops  into both ears. Once a day on Wednesday   Yes [provider]  chlordiazePOXIDE (LIBRIUM) 25 MG capsule Take 25 mg by mouth daily as needed (in addition to the scheduled dose at bedtime).    Yes [provider]  cyclobenzaprine (FLEXERIL) 5 MG tablet Take 5 mg by mouth 3 (three) times daily as needed for muscle spasms.   Yes [provider]  famotidine (PEPCID) 20 MG tablet Take 20 mg by mouth at bedtime.   Yes [provider]  fluPHENAZine (PROLIXIN) 1 MG tablet Take 1 mg by mouth at bedtime.   Yes [provider]  fluticasone (FLONASE) 50 MCG/ACT nasal spray Place 1 spray into both nostrils 2 (two) times daily.   Yes [provider]  furosemide (LASIX) 20 MG tablet Take 20 mg by mouth daily.    Yes [provider]  HYDROcodone-acetaminophen (NORCO/VICODIN) 5-325 MG tablet Take 1 tablet by mouth daily.   Yes [provider]  HYDROcodone-acetaminophen (NORCO/VICODIN) 5-325 MG tablet Take 1 tablet by mouth daily as needed for severe pain. In the evening   Yes [provider]  hydrocortisone 2.5 % cream Apply 1 application topically 2 (two) times daily as needed.   Yes [provider]  hydrocortisone cream 1 % Apply 1 application topically 2 (two) times daily.   Yes [provider]  ibuprofen (ADVIL,MOTRIN) 200 MG tablet Take 200 mg by mouth 3 (three) times daily as needed (pain).   Yes [provider]  linaclotide (LINZESS) 290 MCG CAPS capsule Take 290 mcg by mouth daily.   Yes [provider]  loratadine (CLARITIN) 10 MG tablet Take 10 mg by mouth daily.   Yes [provider]  LORazepam (ATIVAN) 0.5 MG tablet Take one tablet by mouth twice daily for anxiety 10/18/15  Yes Estill Dooms, MD  magnesium hydroxide (MILK OF MAGNESIA) 400 MG/5ML suspension Take 30 mLs by mouth at bedtime.    Yes [provider]  mometasone (ELOCON) 0.1 % cream Apply 1 application topically  2 (two) times daily as needed. To face   Yes [provider]  Multiple Vitamins-Minerals (CENTRUM SILVER PO) Take 1 tablet by mouth.     Yes [provider]  polyvinyl alcohol (LIQUIFILM TEARS) 1.4 % ophthalmic solution Place 1 drop into both eyes 3 (three) times daily.   Yes [provider]  saccharomyces boulardii (FLORASTOR) 250 MG capsule Take 250 mg by mouth daily.    Yes [provider]  spironolactone (ALDACTONE) 12.5 mg TABS tablet Take 12.5 mg by mouth daily.   Yes [provider]  denosumab (PROLIA) 60 MG/ML SOLN injection Inject 60 mg every 6 (six) months into the skin. Administer in upper arm, thigh, or abdomen  [provider]  polyethylene glycol (MIRALAX) packet Take 17 g by mouth daily. 12/01/17   Isla Pence, MD    Family History Family History  Problem Relation Age of Onset  . Alzheimer's disease Mother   . Coronary artery disease Father   . Heart attack Father   . Heart disease Father   . Anuerysm Father        AAA  . Ovarian cancer Sister   . Lung cancer Brother   . Heart disease Brother   . Anuerysm Brother        AAA  . Diabetes Neg Hx   . Colon cancer Neg Hx     Social History Social History   Tobacco Use  . Smoking status: Never Smoker  . Smokeless tobacco: Never Used  Substance Use Topics  . Alcohol use: No    Alcohol/week: 0.0 oz  . Drug use: No     Allergies   Azithromycin; Biaxin [clarithromycin]; Clarithromycin; Doxycycline; Fosamax [alendronate sodium]; Geodon [ziprasidone hydrochloride]; Lipitor [atorvastatin calcium]; Penicillins; Pravachol [pravastatin sodium]; Statins; Sulfonamide derivatives; and Zocor [simvastatin]   Review of Systems Review of Systems  Gastrointestinal: Positive for constipation.  Genitourinary: Positive for difficulty urinating.  All other systems reviewed and are negative.    Physical Exam Updated Vital Signs BP 135/60 (BP Location: Left Arm)    Pulse 71   Temp (!) 97.5 F (36.4 C) (Oral)   Resp 16   Ht 5' (1.524 m)   Wt 64.9 kg (143 lb)   SpO2 99%   BMI 27.93 kg/m   Physical Exam  Constitutional: She is oriented to person, place, and time. She appears well-developed and well-nourished.  HENT:  Head: Normocephalic and atraumatic.  Eyes: EOM are normal.  Pt is blind  Cardiovascular: Normal rate and regular rhythm.  Pulmonary/Chest: Effort normal and breath sounds normal.  Abdominal: Normal appearance. Bowel sounds are decreased. There is tenderness in the suprapubic area.  Neurological: She is alert and oriented to person, place, and time.  Skin: Skin is warm and dry. Capillary refill takes less than 2 seconds.  Psychiatric: She has a normal mood and affect. Her behavior is normal.  Nursing note and vitals reviewed.    ED Treatments / Results  Labs (all labs ordered are listed, but only abnormal results are displayed) Labs Reviewed  COMPREHENSIVE METABOLIC PANEL - Abnormal; Notable for the following components:      Result Value   Creatinine, Ser 1.18 (*)    GFR calc non Af Amer 43 (*)    GFR calc Af Amer 50 (*)    All other components within normal limits  CBC WITH DIFFERENTIAL/PLATELET  URINALYSIS, ROUTINE W REFLEX MICROSCOPIC    EKG  EKG Interpretation None       Radiology Dg Abdomen Acute W/chest  Result Date: 12/01/2017 CLINICAL DATA:  Constipation. EXAM: DG ABDOMEN ACUTE W/ 1V CHEST COMPARISON:  Chest x-ray dated 12/31/2010. Abdominal x-ray dated 12/02/2008 FINDINGS: Single-view of the chest: Cardiomegaly is stable. Overall cardiomediastinal silhouette is stable. Lungs are clear. Osseous structures about the chest are unremarkable. Supine and decubitus views of the abdomen: Numerous nonspecific air-fluid levels throughout the abdomen and pelvis. No dilated large or small bowel loops appreciated. No evidence of soft tissue mass or free intraperitoneal air. Dextroscoliosis of the thoracolumbar spine. No  acute or suspicious osseous finding. IMPRESSION: 1. No active cardiopulmonary disease. No evidence of pneumonia or pulmonary edema. Stable cardiomegaly. 2. No radiographic evidence of constipation. No evidence of bowel  obstruction. Numerous air-fluid levels throughout the abdomen and pelvis suggests ileus. Electronically Signed   By: Franki Cabot M.D.   On: 12/01/2017 10:19    Procedures Procedures (including critical care time)  Medications Ordered in ED Medications - No data to display   Initial Impression / Assessment and Plan / ED Course  I have reviewed the triage vital signs and the nursing notes.  Pertinent labs & imaging results that were available during my care of the patient were reviewed by me and considered in my medical decision making (see chart for details).    Pt did not have bowel obstruction or large constipation on xray.  She did not have much urine in bladder.  I suspect she urinated when she had her bowel movement earlier today.  Pt will be given rx for miralax to help keep her bowels moving.  She is instructed to f/u with pcp.  Return if worse.  Final Clinical Impressions(s) / ED Diagnoses   Final diagnoses:  Constipation, unspecified constipation type    ED Discharge Orders        Ordered    polyethylene glycol (MIRALAX) packet  Daily     12/01/17 1304       Isla Pence, MD 12/01/17 1307

## 2017-12-01 NOTE — ED Notes (Signed)
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 RN has signed for pt.

## 2017-12-01 NOTE — ED Notes (Signed)
[facility] has been contacted 

## 2017-12-01 NOTE — ED Notes (Signed)
Patient transported to X-ray 

## 2017-12-03 ENCOUNTER — Encounter: Payer: Self-pay | Admitting: Nurse Practitioner

## 2017-12-03 ENCOUNTER — Non-Acute Institutional Stay: Payer: Medicare Other | Admitting: Nurse Practitioner

## 2017-12-03 DIAGNOSIS — M25561 Pain in right knee: Secondary | ICD-10-CM | POA: Diagnosis not present

## 2017-12-03 DIAGNOSIS — G8929 Other chronic pain: Secondary | ICD-10-CM

## 2017-12-03 DIAGNOSIS — M25562 Pain in left knee: Secondary | ICD-10-CM

## 2017-12-03 DIAGNOSIS — F411 Generalized anxiety disorder: Secondary | ICD-10-CM | POA: Diagnosis not present

## 2017-12-03 DIAGNOSIS — K5909 Other constipation: Secondary | ICD-10-CM

## 2017-12-03 NOTE — Assessment & Plan Note (Signed)
Managed with Tylneol bid, Norco 5/325 daily and qd prn. This may contributory to her constipation.

## 2017-12-03 NOTE — Assessment & Plan Note (Addendum)
Hx of anxiety, stable on Librium 25mg  daily prn, Prolixin 1mg  qhs, Lorazepam 0.5mg  bid, Amitriptyline 50mg  hs

## 2017-12-03 NOTE — Assessment & Plan Note (Signed)
Will try MiraLax 17gm +4oz fluid po bid, continue Linzess 26mcg po daily, prn MOM daily   ED visit 12/01/17 for constipation. She had X-ray chest/abd showed no acute cardiopulmonary, constipation, bowel obstruction. 12/01/17 Na 141, K 4.0, Bun 19, creat 1.18, wbc 5.2, Hgb 14.1, plt 309, UA clear negative nitrite/leukocytes.

## 2017-12-03 NOTE — Progress Notes (Signed)
Location:  [facility] Room Number: [id number] Place of Service:  ALF [phone]) Provider:  Mast Manxie  NP  Blanchie Serve, MD  Patient Care Team: Blanchie Serve, MD as PCP - General (Internal Medicine) [name], MD (Ophthalmology) [name], MD (Psychiatry) Mast, Man X, NP as Nurse Practitioner (Nurse Practitioner) [name], MD as Consulting Physician (Orthopedic Surgery)  Extended Emergency Contact Information Primary Emergency Contact: [name], [place] Work Phone: [phone] Relation: Son Secondary Emergency Contact: [name] [place] Phone: [phone] Relation: Sister  Code Status:  DNR Goals of care: Advanced Directive information Advanced Directives 12/01/2017  Does Patient Have a Medical Advance Directive? Yes  Type of Advance Directive Out of facility DNR (pink MOST or yellow form)  Does patient want to make changes to medical advance directive? -  Copy of [place] in Chart? -  Pre-existing out of facility DNR order (yellow form or pink MOST form) Yellow form placed in chart (order not valid for inpatient use)     Chief Complaint  Patient presents with  . Acute Visit    Constipation    HPI:  Pt is a 77 y.o. Jean [name] seen today for an acute visit for    Past Medical History:  Diagnosis Date  . Allergy   . Anemia   . Anxiety   . Asthma   . CAD (coronary artery disease)   . Depression   . Diverticulosis   . Dysfunction of eustachian tube   . Elevated LFTs   . Esophageal reflux   . Esophageal stricture 2002  . Hemorrhoids   . History of rectal polyps   . HLD (hyperlipidemia)   . HTN (hypertension)   . Hypertonicity of bladder   . Irritable bowel syndrome with constipation   . Obesity   . Optic neuritis   . Osteoporosis   . Peptic ulcer disease   . Peripheral neuropathy   . Trigeminal neuralgia    prior injury  . Visual  loss, bilateral    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, 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  . Vitamin D deficiency    Past Surgical History:  Procedure Laterality Date  . ABDOMINAL HYSTERECTOMY    . ANAL RECTAL MANOMETRY N/A 07/05/2014   Procedure: ANO RECTAL MANOMETRY;  Surgeon: Leighton Ruff, MD;  Location: [facility];  Service: Endoscopy;  Laterality: N/A;  . APPENDECTOMY    . CAROTID ENDARTERECTOMY    . COLONOSCOPY  2008, 2015  . TONSILLECTOMY    . UPPER GASTROINTESTINAL ENDOSCOPY  2002    Allergies  Allergen Reactions  . Azithromycin   . Biaxin [Clarithromycin]   . Clarithromycin   . Doxycycline   . Fosamax [Alendronate Sodium]   . Geodon [Ziprasidone Hydrochloride]   . Lipitor [Atorvastatin Calcium]   . Penicillins   . Pravachol [Pravastatin Sodium]   . Statins   . Sulfonamide Derivatives   . Zocor [Simvastatin]     Outpatient Encounter Medications as of 12/03/2017  Medication Sig  . acetaminophen (TYLENOL) 325 MG tablet Take 650 mg by mouth every 6 (six) hours as needed.   Marland Kitchen acetaminophen (TYLENOL) 650 MG CR tablet Take 650 mg by mouth 2 (two) times daily.   Marland Kitchen alum & mag hydroxide-simeth (MYLANTA) 200-200-20 MG/5ML suspension Take 30 mLs by mouth at bedtime.  Marland Kitchen amitriptyline (ELAVIL) 50 MG tablet Take 50 mg by mouth at bedtime.  Marland Kitchen  aspirin 81 MG EC tablet Take 81 mg by mouth daily.    . calcium-vitamin D (OSCAL WITH D) 250-125 MG-UNIT tablet Take 1 tablet by mouth 2 (two) times daily. Reported on 12/27/2015  . carbamide peroxide (DEBROX) 6.5 % otic solution Place 1-2 drops into both ears. Once a day on Wednesday  . chlordiazePOXIDE (LIBRIUM) 25 MG capsule Take 25 mg by mouth daily as needed (in addition to the scheduled dose at bedtime).   . cyclobenzaprine (FLEXERIL) 5 MG tablet Take 5 mg by mouth 3 (three) times daily as needed for muscle spasms.  . famotidine (PEPCID) 20 MG tablet Take 20 mg by mouth at bedtime.  . fluPHENAZine (PROLIXIN) 1 MG tablet Take 1 mg by  mouth at bedtime.  . fluticasone (FLONASE) 50 MCG/ACT nasal spray Place 1 spray into both nostrils 2 (two) times daily.  . furosemide (LASIX) 20 MG tablet Take 20 mg by mouth daily.   Marland Kitchen HYDROcodone-acetaminophen (NORCO/VICODIN) 5-325 MG tablet Take 1 tablet by mouth every morning.   Marland Kitchen HYDROcodone-acetaminophen (NORCO/VICODIN) 5-325 MG tablet Take 1 tablet by mouth daily as needed for severe pain. In the evening  . hydrocortisone 2.5 % cream Apply 1 application topically 2 (two) times daily as needed.  . hydrocortisone 2.5 % cream Apply 1 application topically 2 (two) times daily as needed.  Marland Kitchen ibuprofen (ADVIL,MOTRIN) 200 MG tablet Take 200 mg by mouth 3 (three) times daily as needed (pain).  Marland Kitchen linaclotide (LINZESS) 290 MCG CAPS capsule Take 290 mcg by mouth daily.  Marland Kitchen LORazepam (ATIVAN) 0.5 MG tablet Take one tablet by mouth twice daily for anxiety  . magnesium hydroxide (MILK OF MAGNESIA) 400 MG/5ML suspension Take 30 mLs by mouth at bedtime.   . Multiple Vitamins-Minerals (CENTRUM SILVER PO) Take 1 tablet by mouth.    . polyethylene glycol (MIRALAX) packet Take 17 g by mouth daily.  . polyvinyl alcohol (LIQUIFILM TEARS) 1.4 % ophthalmic solution Place 1 drop into both eyes 3 (three) times daily.  Marland Kitchen saccharomyces boulardii (FLORASTOR) 250 MG capsule Take 250 mg by mouth daily.   Marland Kitchen spironolactone (ALDACTONE) 12.5 mg TABS tablet Take 12.5 mg by mouth daily.  Marland Kitchen denosumab (PROLIA) 60 MG/ML SOLN injection Inject 60 mg every 6 (six) months into the skin. Administer in upper arm, thigh, or abdomen  . [DISCONTINUED] hydrocortisone cream 1 % Apply 1 application topically 2 (two) times daily.  . [DISCONTINUED] loratadine (CLARITIN) 10 MG tablet Take 10 mg by mouth daily.  . [DISCONTINUED] mometasone (ELOCON) 0.1 % cream Apply 1 application topically 2 (two) times daily as needed. To face   No facility-administered encounter medications on file as of 12/03/2017.     Review of Systems  Immunization  History  Administered Date(s) Administered  . Influenza Split 07/23/2013  . Influenza Whole 07/05/2009  . Influenza-Unspecified 07/29/2014, 06/14/2015, 07/11/2016, 07/15/2017  . PPD Test 06/17/2013  . Pneumococcal Conjugate-13 07/22/2017  . Pneumococcal Polysaccharide-23 05/05/2009  . Td 05/05/2009  . Zoster 01/09/2011   Pertinent  Health Maintenance Due  Topic Date Due  . INFLUENZA VACCINE  Completed  . DEXA SCAN  Completed  . PNA vac Low Risk Adult  Completed   Fall Risk  06/04/2017 07/29/2014  Falls in the past year? Yes No  Number falls in past yr: 1 -  Injury with Fall? No -   Functional Status Survey:    Vitals:   12/03/17 1106  BP: 120/70  Pulse: 72  Resp: 18  Temp: (!) 97.2 F (36.2  C)  SpO2: 94%  Weight: 143 lb 9.6 oz (65.1 kg)  Height: 5\' 2"  (1.575 m)   Body mass index is 26.26 kg/m. Physical Exam  Labs reviewed: Recent Labs    02/05/17 03/21/17 12/01/17 0926  NA 139 137 141  K  --  4.4 4.0  CL  --   --  105  CO2  --   --  27  GLUCOSE  --   --  95  BUN 14 17 19   CREATININE 1.1 1.1 1.18*  CALCIUM  --   --  9.5   Recent Labs    02/05/17 12/01/17 0926  AST 24 29  ALT 15 19  ALKPHOS 99 117  BILITOT  --  0.4  PROT  --  8.1  ALBUMIN  --  4.2   Recent Labs    02/05/17 12/01/17 0926  WBC 6.2 5.2  NEUTROABS  --  2.3  HGB 11.4* 14.2  HCT 35* 43.9  MCV  --  93.2  PLT 337 309   Lab Results  Component Value Date   TSH 2.55 05/28/2017   Lab Results  Component Value Date   HGBA1C 5.9 02/05/2017   Lab Results  Component Value Date   CHOL 155 04/04/2013   HDL 53 04/04/2013   LDLCALC 80 04/04/2013   LDLDIRECT 190.0 03/24/2012   TRIG 108 04/04/2013   CHOLHDL 4 03/24/2012    Significant Diagnostic Results in last 30 days:  Dg Abdomen Acute W/chest  Result Date: 12/01/2017 CLINICAL DATA:  Constipation. EXAM: DG ABDOMEN ACUTE W/ 1V CHEST COMPARISON:  Chest x-ray dated 12/31/2010. Abdominal x-ray dated 12/02/2008 FINDINGS: Single-view of  the chest: Cardiomegaly is stable. Overall cardiomediastinal silhouette is stable. Lungs are clear. Osseous structures about the chest are unremarkable. Supine and decubitus views of the abdomen: Numerous nonspecific air-fluid levels throughout the abdomen and pelvis. No dilated large or small bowel loops appreciated. No evidence of soft tissue mass or free intraperitoneal air. Dextroscoliosis of the thoracolumbar spine. No acute or suspicious osseous finding. IMPRESSION: 1. No active cardiopulmonary disease. No evidence of pneumonia or pulmonary edema. Stable cardiomegaly. 2. No radiographic evidence of constipation. No evidence of bowel obstruction. Numerous air-fluid levels throughout the abdomen and pelvis suggests ileus. Electronically Signed   By: Franki Cabot M.D.   On: 12/01/2017 10:19    Assessment/Plan 1. Constipation   2. Anxiety state   3. Chronic pain of both knees     Family/ staff Communication:   Labs/tests ordered:

## 2017-12-03 NOTE — Progress Notes (Signed)
Location:   [facility] Room Number: 163 Place of Service:  ALF (13) Provider: Lennie Odor Leslieann Whisman NP  Blanchie Serve, MD  Patient Care Team: Blanchie Serve, MD as PCP - General (Internal Medicine) [name], MD (Ophthalmology) [name], MD (Psychiatry) Leith Szafranski X, NP as Nurse Practitioner (Nurse Practitioner) [name], MD as Consulting Physician (Orthopedic Surgery)  Extended Emergency Contact Information Primary Emergency Contact: [name], [place] Work Phone: [phone] Relation: Son Secondary Emergency Contact: [name] [place] Phone: [phone] Relation: Sister  Code Status: DNR Goals of care: Advanced Directive information Advanced Directives 12/01/2017  Does Patient Have a Medical Advance Directive? Yes  Type of Advance Directive Out of facility DNR (pink MOST or yellow form)  Does patient want to make changes to medical advance directive? -  Copy of [place] in Chart? -  Pre-existing out of facility DNR order (yellow form or pink MOST form) Yellow form placed in chart (order not valid for inpatient use)     Chief Complaint  Patient presents with  . Acute Visit    Constipation    HPI:  Pt is a 77 y.o. female seen today for an acute visit for persisted constipation, last BM this morning after MOM 78ml and purine juice last night, she desires to have MiraLax bid instead of MOM to regulate her BM. She takes Linzess 273mcg po daily    ED visit 12/01/17 for complaining of SOB per patient, but ED noted stated she was evaluated for constipation and urinary retention. She stated her SOB was from bloated abdomen and constipation, may be allergic reaction to [place] food ingested about 2 days prior to the event. Her symptoms resolved without intervention. She denied chest pain, SOB, palpitation, bloating abd, urinary retention,  constipation, or diarrhea. She had  X-ray chest/abd showed no acute cardiopulmonary, constipation, bowel obstruction. 12/01/17 Na 141, K 4.0, Bun 19, creat 1.18, wbc 5.2, Hgb 14.1, plt 309, UA clear negative nitrite/leukocytes.    Hx of anxiety, stable on Librium 25mg  daily prn, Prolixin 1mg  qhs, Lorazepam 0.5mg  bid, Amitriptyline 50mg  hs.    Past Medical History:  Diagnosis Date  . Allergy   . Anemia   . Anxiety   . Asthma   . CAD (coronary artery disease)   . Depression   . Diverticulosis   . Dysfunction of eustachian tube   . Elevated LFTs   . Esophageal reflux   . Esophageal stricture 2002  . Hemorrhoids   . History of rectal polyps   . HLD (hyperlipidemia)   . HTN (hypertension)   . Hypertonicity of bladder   . Irritable bowel syndrome with constipation   . Obesity   . Optic neuritis   . Osteoporosis   . Peptic ulcer disease   . Peripheral neuropathy   . Trigeminal neuralgia    prior injury  . 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 Vitamin D deficiency    Past Surgical History:  Procedure Laterality Date  . ABDOMINAL HYSTERECTOMY    . ANAL RECTAL MANOMETRY N/A 07/05/2014   Procedure: ANO RECTAL MANOMETRY;  Surgeon: Leighton Ruff, MD;  Location: [facility];  Service: Endoscopy;  Laterality: N/A;  . APPENDECTOMY    . CAROTID ENDARTERECTOMY    . COLONOSCOPY  2008, 2015  . TONSILLECTOMY    . UPPER GASTROINTESTINAL  ENDOSCOPY  2002    Allergies  Allergen Reactions  . Azithromycin   . Biaxin [Clarithromycin]   . Clarithromycin   . Doxycycline   . Fosamax [Alendronate Sodium]   . Geodon [Ziprasidone Hydrochloride]   . Lipitor [Atorvastatin Calcium]   . Penicillins   . Pravachol [Pravastatin Sodium]   . Statins   . Sulfonamide Derivatives   . Zocor [Simvastatin]     Allergies as of 12/03/2017      Reactions   Azithromycin    Biaxin [clarithromycin]    Clarithromycin    Doxycycline    Fosamax [alendronate Sodium]    Geodon  [ziprasidone Hydrochloride]    Lipitor [atorvastatin Calcium]    Penicillins    Pravachol [pravastatin Sodium]    Statins    Sulfonamide Derivatives    Zocor [simvastatin]       Medication List        Accurate as of 12/03/17 11:59 PM. Always use your most recent med list.          acetaminophen 650 MG CR tablet Commonly known as:  TYLENOL Take 650 mg by mouth 2 (two) times daily.   acetaminophen 325 MG tablet Commonly known as:  TYLENOL Take 650 mg by mouth every 6 (six) hours as needed.   amitriptyline 50 MG tablet Commonly known as:  ELAVIL Take 50 mg by mouth at bedtime.   aspirin 81 MG EC tablet Take 81 mg by mouth daily.   calcium-vitamin D 250-125 MG-UNIT tablet Commonly known as:  OSCAL WITH D Take 1 tablet by mouth 2 (two) times daily. Reported on 12/27/2015   carbamide peroxide 6.5 % OTIC solution Commonly known as:  DEBROX Place 1-2 drops into both ears. Once a day on Wednesday   CENTRUM SILVER PO Take 1 tablet by mouth.   chlordiazePOXIDE 25 MG capsule Commonly known as:  LIBRIUM Take 25 mg by mouth daily as needed (in addition to the scheduled dose at bedtime).   cyclobenzaprine 5 MG tablet Commonly known as:  FLEXERIL Take 5 mg by mouth 3 (three) times daily as needed for muscle spasms.   denosumab 60 MG/ML Soln injection Commonly known as:  PROLIA Inject 60 mg every 6 (six) months into the skin. Administer in upper arm, thigh, or abdomen   famotidine 20 MG tablet Commonly known as:  PEPCID Take 20 mg by mouth at bedtime.   fluPHENAZine 1 MG tablet Commonly known as:  PROLIXIN Take 1 mg by mouth at bedtime.   fluticasone 50 MCG/ACT nasal spray Commonly known as:  FLONASE Place 1 spray into both nostrils 2 (two) times daily.   furosemide 20 MG tablet Commonly known as:  LASIX Take 20 mg by mouth daily.   HYDROcodone-acetaminophen 5-325 MG tablet Commonly known as:  NORCO/VICODIN Take 1 tablet by mouth every morning.     HYDROcodone-acetaminophen 5-325 MG tablet Commonly known as:  NORCO/VICODIN Take 1 tablet by mouth daily as needed for severe pain. In the evening   hydrocortisone 2.5 % cream Apply 1 application topically 2 (two) times daily as needed.   hydrocortisone 2.5 % cream Apply 1 application topically 2 (two) times daily as needed.   ibuprofen 200 MG tablet Commonly known as:  ADVIL,MOTRIN Take 200 mg by mouth 3 (three) times daily as needed (pain).   LINZESS 290 MCG Caps capsule Generic drug:  linaclotide Take 290 mcg by mouth daily.   LORazepam 0.5 MG tablet Commonly known as:  ATIVAN Take one tablet by mouth twice  daily for anxiety   magnesium hydroxide 400 MG/5ML suspension Commonly known as:  MILK OF MAGNESIA Take 30 mLs by mouth at bedtime.   MYLANTA 200-200-20 MG/5ML suspension Generic drug:  alum & mag hydroxide-simeth Take 30 mLs by mouth at bedtime.   polyethylene glycol packet Commonly known as:  MIRALAX Take 17 g by mouth daily.   polyvinyl alcohol 1.4 % ophthalmic solution Commonly known as:  LIQUIFILM TEARS Place 1 drop into both eyes 3 (three) times daily.   saccharomyces boulardii 250 MG capsule Commonly known as:  FLORASTOR Take 250 mg by mouth daily.   spironolactone 12.5 mg Tabs tablet Commonly known as:  ALDACTONE Take 12.5 mg by mouth daily.       Review of Systems  Constitutional: Negative for activity change, appetite change, chills, diaphoresis, fatigue and fever.  HENT: Positive for hearing loss. Negative for congestion, trouble swallowing and voice change.   Eyes:       Legally blind.   Respiratory: Negative for cough, chest tightness, shortness of breath and wheezing.   Cardiovascular: Positive for leg swelling. Negative for chest pain and palpitations.  Gastrointestinal: Positive for constipation. Negative for abdominal distention, abdominal pain, anal bleeding, blood in stool, diarrhea, nausea and vomiting.  Genitourinary: Negative for  difficulty urinating, dysuria and urgency.  Musculoskeletal: Positive for arthralgias and gait problem. Negative for joint swelling.  Skin: Negative for color change and pallor.  Neurological: Negative for speech difficulty, weakness and headaches.  Psychiatric/Behavioral: Negative for agitation, behavioral problems, confusion, hallucinations and sleep disturbance. The patient is not nervous/anxious.     Immunization History  Administered Date(s) Administered  . Influenza Split 07/23/2013  . Influenza Whole 07/05/2009  . Influenza-Unspecified 07/29/2014, 06/14/2015, 07/11/2016, 07/15/2017  . PPD Test 06/17/2013  . Pneumococcal Conjugate-13 07/22/2017  . Pneumococcal Polysaccharide-23 05/05/2009  . Td 05/05/2009  . Zoster 01/09/2011   Pertinent  Health Maintenance Due  Topic Date Due  . INFLUENZA VACCINE  Completed  . DEXA SCAN  Completed  . PNA vac Low Risk Adult  Completed   Fall Risk  06/04/2017 07/29/2014  Falls in the past year? Yes No  Number falls in past yr: 1 -  Injury with Fall? No -   Functional Status Survey:    Vitals:   12/03/17 1106  BP: 120/70  Pulse: 72  Resp: 18  Temp: (!) 97.2 F (36.2 C)  SpO2: 94%  Weight: 143 lb 9.6 oz (65.1 kg)  Height: 5\' 2"  (1.575 m)   Body mass index is 26.26 kg/m. Physical Exam  Constitutional: She is oriented to person, place, and time. She appears well-developed and well-nourished. No distress.  HENT:  Head: Normocephalic and atraumatic.  Eyes: Conjunctivae and EOM are normal. Pupils are equal, round, and reactive to light.  Neck: Neck supple. No thyromegaly present.  Cardiovascular: Normal rate and regular rhythm.  No murmur heard. Pulmonary/Chest: Effort normal. She has no wheezes. She has no rales.  Abdominal: Soft. Bowel sounds are normal. She exhibits no distension. There is no rebound and no guarding.  Musculoskeletal: Normal range of motion. She exhibits edema.  Trace edema in BLE, ambulates with walker.     Neurological: She is alert and oriented to person, place, and time. She exhibits normal muscle tone. Coordination normal.  Skin: Skin is warm and dry. No rash noted. She is not diaphoretic. No erythema.  Psychiatric: She has a normal mood and affect. Her behavior is normal. Judgment and thought content normal.    Labs reviewed:  Recent Labs    02/05/17 03/21/17 12/01/17 0926  NA 139 137 141  K  --  4.4 4.0  CL  --   --  105  CO2  --   --  27  GLUCOSE  --   --  95  BUN 14 17 19   CREATININE 1.1 1.1 1.18*  CALCIUM  --   --  9.5   Recent Labs    02/05/17 12/01/17 0926  AST 24 29  ALT 15 19  ALKPHOS 99 117  BILITOT  --  0.4  PROT  --  8.1  ALBUMIN  --  4.2   Recent Labs    02/05/17 12/01/17 0926  WBC 6.2 5.2  NEUTROABS  --  2.3  HGB 11.4* 14.2  HCT 35* 43.9  MCV  --  93.2  PLT 337 309   Lab Results  Component Value Date   TSH 2.55 05/28/2017   Lab Results  Component Value Date   HGBA1C 5.9 02/05/2017   Lab Results  Component Value Date   CHOL 155 04/04/2013   HDL 53 04/04/2013   LDLCALC 80 04/04/2013   LDLDIRECT 190.0 03/24/2012   TRIG 108 04/04/2013   CHOLHDL 4 03/24/2012    Significant Diagnostic Results in last 30 days:  Dg Abdomen Acute W/chest  Result Date: 12/01/2017 CLINICAL DATA:  Constipation. EXAM: DG ABDOMEN ACUTE W/ 1V CHEST COMPARISON:  Chest x-ray dated 12/31/2010. Abdominal x-ray dated 12/02/2008 FINDINGS: Single-view of the chest: Cardiomegaly is stable. Overall cardiomediastinal silhouette is stable. Lungs are clear. Osseous structures about the chest are unremarkable. Supine and decubitus views of the abdomen: Numerous nonspecific air-fluid levels throughout the abdomen and pelvis. No dilated large or small bowel loops appreciated. No evidence of soft tissue mass or free intraperitoneal air. Dextroscoliosis of the thoracolumbar spine. No acute or suspicious osseous finding. IMPRESSION: 1. No active cardiopulmonary disease. No evidence of  pneumonia or pulmonary edema. Stable cardiomegaly. 2. No radiographic evidence of constipation. No evidence of bowel obstruction. Numerous air-fluid levels throughout the abdomen and pelvis suggests ileus. Electronically Signed   By: Franki Cabot M.D.   On: 12/01/2017 10:19    Assessment/Plan: Constipation Will try MiraLax 17gm +4oz fluid po bid, continue Linzess 272mcg po daily, prn MOM daily   ED visit 12/01/17 for constipation. She had X-ray chest/abd showed no acute cardiopulmonary, constipation, bowel obstruction. 12/01/17 Na 141, K 4.0, Bun 19, creat 1.18, wbc 5.2, Hgb 14.1, plt 309, UA clear negative nitrite/leukocytes.       Anxiety state Hx of anxiety, stable on Librium 25mg  daily prn, Prolixin 1mg  qhs, Lorazepam 0.5mg  bid, Amitriptyline 50mg  hs    Chronic pain of both knees Managed with Tylneol bid, Norco 5/325 daily and qd prn. This may contributory to her constipation.     Family/ staff Communication: plan of care reviewed with the patient and charge nurse.   Labs/tests ordered: none  Time spend 25 minutes.

## 2017-12-06 ENCOUNTER — Non-Acute Institutional Stay: Payer: Medicare Other | Admitting: Internal Medicine

## 2017-12-06 ENCOUNTER — Encounter: Payer: Self-pay | Admitting: Internal Medicine

## 2017-12-06 DIAGNOSIS — M8000XA Age-related osteoporosis with current pathological fracture, unspecified site, initial encounter for fracture: Secondary | ICD-10-CM

## 2017-12-06 DIAGNOSIS — I10 Essential (primary) hypertension: Secondary | ICD-10-CM | POA: Diagnosis not present

## 2017-12-06 DIAGNOSIS — K5903 Drug induced constipation: Secondary | ICD-10-CM | POA: Diagnosis not present

## 2017-12-06 DIAGNOSIS — J302 Other seasonal allergic rhinitis: Secondary | ICD-10-CM | POA: Diagnosis not present

## 2017-12-06 DIAGNOSIS — J3089 Other allergic rhinitis: Secondary | ICD-10-CM

## 2017-12-06 DIAGNOSIS — F418 Other specified anxiety disorders: Secondary | ICD-10-CM | POA: Diagnosis not present

## 2017-12-06 DIAGNOSIS — K219 Gastro-esophageal reflux disease without esophagitis: Secondary | ICD-10-CM

## 2017-12-06 DIAGNOSIS — M159 Polyosteoarthritis, unspecified: Secondary | ICD-10-CM

## 2017-12-06 DIAGNOSIS — T402X5A Adverse effect of other opioids, initial encounter: Secondary | ICD-10-CM

## 2017-12-06 NOTE — Progress Notes (Signed)
Location:  [facility] Room Number: [phone] Place of Service:  ALF [phone]) Provider:  Blanchie Serve MD  Blanchie Serve, MD  Patient Care Team: Blanchie Serve, MD as PCP - General (Internal Medicine) [name], MD (Ophthalmology) [name], MD (Psychiatry) Mast, Man X, NP as Nurse Practitioner (Nurse Practitioner) [name], MD as Consulting Physician (Orthopedic Surgery)  Extended Emergency Contact Information Primary Emergency Contact: [name], [place] Work Phone: [phone] Relation: Son Secondary Emergency Contact: [name] [place] Phone: [phone] Relation: Sister  Code Status:  DNR  Goals of care: Advanced Directive information Advanced Directives 12/06/2017  Does Patient Have a Medical Advance Directive? Yes  Type of Paramedic of [place];Living will;Out of facility DNR (pink MOST or yellow form)  Does patient want to make changes to medical advance directive? No - Patient declined  Copy of [place] in Chart? Yes  Pre-existing out of facility DNR order (yellow form or pink MOST form) Yellow form placed in chart (order not valid for inpatient use)     Chief Complaint  Patient presents with  . Medical Management of Chronic Issues    Routine Visit     HPI:  Pt is a 77 y.o. female seen today for medical management of chronic diseases. She denies any particular concern this visit. No acute concern from nursing.   OA- denies pain this visit. Currently on tylenol 650 mg bid and norco daily with prn dosing. Has not required flexeril after 08/30/17. On chart review alos on prn ibuprofen and prn tylenol  Depression with anxiety- takes amitriptyline 50 mg daily, librium  with lorazepam 0.5 mg bid, denies restlessness or anxiety this visit. Also on fluphenazine. Last seen by Dr Casimiro Needle on 08/26/17.  Constipation- with  history of IBS. prune juice and milk of magnesia have been helpful. Also on linzess daily and miralax bid.   Osteoporosis- no fall reported. Currently on prolia injection every 6 months. Also on calcium and vitamin D.   Allergic rhinitis- currently on flonase and claritin as needed  gerd- overall controlled symptom. Currently on famotidine 20 mg daily    Past Medical History:  Diagnosis Date  . Allergy   . Anemia   . Anxiety   . Asthma   . CAD (coronary artery disease)   . Depression   . Diverticulosis   . Dysfunction of eustachian tube   . Elevated LFTs   . Esophageal reflux   . Esophageal stricture 2002  . Hemorrhoids   . History of rectal polyps   . HLD (hyperlipidemia)   . HTN (hypertension)   . Hypertonicity of bladder   . Irritable bowel syndrome with constipation   . Obesity   . Optic neuritis   . Osteoporosis   . Peptic ulcer disease   . Peripheral neuropathy   . Trigeminal neuralgia    prior injury  . 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 Vitamin D deficiency    Past Surgical History:  Procedure Laterality Date  . ABDOMINAL HYSTERECTOMY    . ANAL RECTAL MANOMETRY N/A 07/05/2014   Procedure: ANO RECTAL MANOMETRY;  Surgeon: Leighton Ruff, MD;  Location: [facility];  Service: Endoscopy;  Laterality: N/A;  . APPENDECTOMY    . CAROTID ENDARTERECTOMY    . COLONOSCOPY  2008, 2015  . TONSILLECTOMY    .  UPPER GASTROINTESTINAL ENDOSCOPY  2002    Allergies  Allergen Reactions  . Azithromycin   . Biaxin [Clarithromycin]   . Clarithromycin   . Doxycycline   . Fosamax [Alendronate Sodium]   . Geodon [Ziprasidone Hydrochloride]   . Lipitor [Atorvastatin Calcium]   . Penicillins   . Pravachol [Pravastatin Sodium]   . Statins   . Sulfonamide Derivatives   . Zocor [Simvastatin]     Outpatient Encounter Medications as of 12/06/2017  Medication Sig  . acetaminophen (TYLENOL) 325 MG tablet Take  650 mg by mouth every 6 (six) hours as needed.   Marland Kitchen acetaminophen (TYLENOL) 650 MG CR tablet Take 650 mg by mouth 2 (two) times daily.   Marland Kitchen acyclovir ointment (ZOVIRAX) 5 % Apply 1 application topically 6 (six) times daily. As needed  . alum & mag hydroxide-simeth (MYLANTA) 200-200-20 MG/5ML suspension Take 30 mLs by mouth at bedtime.  Marland Kitchen alum & mag hydroxide-simeth (MYLANTA) 200-200-20 MG/5ML suspension Take 30 mLs by mouth every 4 (four) hours as needed for indigestion or heartburn.  Marland Kitchen amitriptyline (ELAVIL) 50 MG tablet Take 50 mg by mouth at bedtime.  Marland Kitchen aspirin 81 MG EC tablet Take 81 mg by mouth daily.    . bisacodyl (DULCOLAX) 10 MG suppository Place 10 mg rectally daily as needed for moderate constipation.  . bisacodyl (DULCOLAX) 5 MG EC tablet Take 5 mg by mouth daily as needed for moderate constipation.  . calcium-vitamin D (OSCAL WITH D) 250-125 MG-UNIT tablet Take 1 tablet by mouth 2 (two) times daily. Reported on 12/27/2015  . carbamide peroxide (DEBROX) 6.5 % otic solution Place 1-2 drops into both ears. Once a day on Wednesday  . chlordiazePOXIDE (LIBRIUM) 25 MG capsule Take 25 mg by mouth daily as needed (in addition to the scheduled dose at bedtime).   . cyclobenzaprine (FLEXERIL) 5 MG tablet Take 5 mg by mouth 3 (three) times daily as needed for muscle spasms.  Marland Kitchen denosumab (PROLIA) 60 MG/ML SOLN injection Inject 60 mg every 6 (six) months into the skin. Administer in upper arm, thigh, or abdomen  . famotidine (PEPCID) 20 MG tablet Take 20 mg by mouth at bedtime.  . fluPHENAZine (PROLIXIN) 1 MG tablet Take 1 mg by mouth at bedtime.  . fluticasone (FLONASE) 50 MCG/ACT nasal spray Place 1 spray into both nostrils 2 (two) times daily.  . furosemide (LASIX) 20 MG tablet Take 20 mg by mouth daily.   Marland Kitchen HYDROcodone-acetaminophen (NORCO/VICODIN) 5-325 MG tablet Take 1 tablet by mouth every morning.   Marland Kitchen HYDROcodone-acetaminophen (NORCO/VICODIN) 5-325 MG tablet Take 1 tablet by mouth daily as  needed for severe pain. In the evening  . hydrocortisone 2.5 % cream Apply 1 application topically 2 (two) times daily as needed.  . hydrocortisone cream 1 % Apply 1 application topically 2 (two) times daily.  Marland Kitchen ibuprofen (ADVIL,MOTRIN) 200 MG tablet Take 200 mg by mouth 3 (three) times daily as needed (pain).  Marland Kitchen linaclotide (LINZESS) 290 MCG CAPS capsule Take 290 mcg by mouth daily.  Marland Kitchen loratadine (CLARITIN) 10 MG tablet Take 10 mg by mouth daily as needed for allergies.  Marland Kitchen LORazepam (ATIVAN) 0.5 MG tablet Take one tablet by mouth twice daily for anxiety  . magnesium hydroxide (MILK OF MAGNESIA) 400 MG/5ML suspension Take 30 mLs by mouth daily as needed.   . mometasone (ELOCON) 0.1 % ointment Apply 1 application topically 2 (two) times daily as needed.  . Multiple Vitamins-Minerals (CENTRUM SILVER PO) Take 1 tablet by mouth.    Marland Kitchen  polyethylene glycol (MIRALAX / GLYCOLAX) packet Take 17 g by mouth 2 (two) times daily.  . polyvinyl alcohol (LIQUIFILM TEARS) 1.4 % ophthalmic solution Place 1 drop into both eyes 3 (three) times daily.  Marland Kitchen saccharomyces boulardii (FLORASTOR) 250 MG capsule Take 250 mg by mouth daily.   Marland Kitchen spironolactone (ALDACTONE) 12.5 mg TABS tablet Take 12.5 mg by mouth daily.  . [DISCONTINUED] hydrocortisone 2.5 % cream Apply 1 application topically 2 (two) times daily as needed.  . [DISCONTINUED] polyethylene glycol (MIRALAX) packet Take 17 g by mouth daily.   No facility-administered encounter medications on file as of 12/06/2017.     Review of Systems  Constitutional: Negative for appetite change, chills, diaphoresis and fever.  HENT: Positive for hearing loss and tinnitus. Negative for congestion, mouth sores, postnasal drip, sore throat and trouble swallowing.   Eyes:       Legally blind   Respiratory: Negative for cough, shortness of breath and wheezing.   Cardiovascular: Positive for leg swelling. Negative for chest pain and palpitations.  Gastrointestinal: Negative for  abdominal pain, constipation, diarrhea, nausea, rectal pain and vomiting.  Genitourinary: Negative for dysuria and pelvic pain.  Musculoskeletal: Positive for gait problem. Negative for arthralgias and back pain.       No fall reported  Skin: Negative for rash.  Neurological: Negative for dizziness and headaches.  Psychiatric/Behavioral: Negative for behavioral problems.    Immunization History  Administered Date(s) Administered  . Influenza Split 07/23/2013  . Influenza Whole 07/05/2009  . Influenza-Unspecified 07/29/2014, 06/14/2015, 07/11/2016, 07/15/2017  . PPD Test 06/17/2013  . Pneumococcal Conjugate-13 07/22/2017  . Pneumococcal Polysaccharide-23 05/05/2009  . Td 05/05/2009  . Zoster 01/09/2011   Pertinent  Health Maintenance Due  Topic Date Due  . INFLUENZA VACCINE  Completed  . DEXA SCAN  Completed  . PNA vac Low Risk Adult  Completed   Fall Risk  06/04/2017 07/29/2014  Falls in the past year? Yes No  Number falls in past yr: 1 -  Injury with Fall? No -   Functional Status Survey:    Vitals:   12/06/17 1424  BP: 140/90  Pulse: 70  Resp: 18  Temp: (!) 97.2 F (36.2 C)  TempSrc: Oral  SpO2: 94%  Weight: 142 lb 12.8 oz (64.8 kg)  Height: 5' 1.5" (1.562 m)   Body mass index is 26.54 kg/m.   Wt Readings from Last 3 Encounters:  12/06/17 142 lb 12.8 oz (64.8 kg)  12/03/17 143 lb 9.6 oz (65.1 kg)  12/01/17 143 lb (64.9 kg)   Physical Exam  Constitutional: She is oriented to person, place, and time. She appears well-developed and well-nourished. No distress.  HENT:  Head: Normocephalic and atraumatic.  Mouth/Throat: Oropharynx is clear and moist. No oropharyngeal exudate.  Eyes: Conjunctivae and EOM are normal. Pupils are equal, round, and reactive to light. Right eye exhibits no discharge. Left eye exhibits no discharge.  Neck: Neck supple.  Cardiovascular: Normal rate, regular rhythm and intact distal pulses.  Pulmonary/Chest: Effort normal and breath  sounds normal. She has no wheezes.  Abdominal: Soft. Bowel sounds are normal. There is no tenderness. There is no guarding.  Musculoskeletal: She exhibits edema.  Trace leg edema, able to move all 4 extremities, unsteady gait, uses walker  Lymphadenopathy:    She has no cervical adenopathy.  Neurological: She is alert and oriented to person, place, and time.  Skin: Skin is warm and dry. She is not diaphoretic.  Psychiatric: She has a normal mood and  affect. Her behavior is normal.    Labs reviewed: Recent Labs    02/05/17 03/21/17 12/01/17 0926  NA 139 137 141  K  --  4.4 4.0  CL  --   --  105  CO2  --   --  27  GLUCOSE  --   --  95  BUN 14 17 19   CREATININE 1.1 1.1 1.18*  CALCIUM  --   --  9.5   Recent Labs    02/05/17 12/01/17 0926  AST 24 29  ALT 15 19  ALKPHOS 99 117  BILITOT  --  0.4  PROT  --  8.1  ALBUMIN  --  4.2   Recent Labs    02/05/17 12/01/17 0926  WBC 6.2 5.2  NEUTROABS  --  2.3  HGB 11.4* 14.2  HCT 35* 43.9  MCV  --  93.2  PLT 337 309   Lab Results  Component Value Date   TSH 2.55 05/28/2017   Lab Results  Component Value Date   HGBA1C 5.9 02/05/2017   Lab Results  Component Value Date   CHOL 155 04/04/2013   HDL 53 04/04/2013   LDLCALC 80 04/04/2013   LDLDIRECT 190.0 03/24/2012   TRIG 108 04/04/2013   CHOLHDL 4 03/24/2012    Significant Diagnostic Results in last 30 days:  Dg Abdomen Acute W/chest  Result Date: 12/01/2017 CLINICAL DATA:  Constipation. EXAM: DG ABDOMEN ACUTE W/ 1V CHEST COMPARISON:  Chest x-ray dated 12/31/2010. Abdominal x-ray dated 12/02/2008 FINDINGS: Single-view of the chest: Cardiomegaly is stable. Overall cardiomediastinal silhouette is stable. Lungs are clear. Osseous structures about the chest are unremarkable. Supine and decubitus views of the abdomen: Numerous nonspecific air-fluid levels throughout the abdomen and pelvis. No dilated large or small bowel loops appreciated. No evidence of soft tissue mass or  free intraperitoneal air. Dextroscoliosis of the thoracolumbar spine. No acute or suspicious osseous finding. IMPRESSION: 1. No active cardiopulmonary disease. No evidence of pneumonia or pulmonary edema. Stable cardiomegaly. 2. No radiographic evidence of constipation. No evidence of bowel obstruction. Numerous air-fluid levels throughout the abdomen and pelvis suggests ileus. Electronically Signed   By: Franki Cabot M.D.   On: 12/01/2017 10:19    Assessment/Plan  1. Essential hypertension Controlled BP, continue lasix and spironolactone for now. Reviewed BMP, monitor.  2. Seasonal and perennial allergic rhinitis Cotinue current regimen of flonase and claritin, monitor  3. Gastroesophageal reflux disease without esophagitis Continue famotidine  4. Therapeutic opioid induced constipation With her on narcotic, continue current bowel regimen. Monitor for diarrhea with her history of IBS.   5. Age-related osteoporosis with current pathological fracture, initial encounter Continue prolia with calcium and vitamin D  6. Depression with anxiety Continue amitriptyline 50 mg daily, librium  with lorazepam 0.5 mg bid and fluphenazine. Last seen by Dr Casimiro Needle on 08/26/17.  7. Osteoarthritis With impaired renal function, d/c ibuprofen prn dosing. Continue current regimen for norco and tylenol.    Family/ staff Communication: reviewed care plan with patient and charge nurse.    Labs/tests ordered:  Bmp, lipid in 1 month   Blanchie Serve, MD Internal Medicine [facility] [address] [place], [place] [address] Cell Phone (Monday-Friday 8 am - 5 pm): [phone] On Call: [phone] and follow prompts after 5 pm and on weekends Office Phone: [phone] Office Fax: [phone]

## 2018-01-07 ENCOUNTER — Other Ambulatory Visit: Payer: Self-pay | Admitting: *Deleted

## 2018-01-07 LAB — BASIC METABOLIC PANEL
BUN: 16 (ref 4–21)
CREATININE: 1 (ref <=1.1)
Glucose: 89
Potassium: 4.5 (ref 3.4–5.3)
Sodium: 140 (ref 137–147)

## 2018-01-07 LAB — LIPID PANEL
Cholesterol: 253 — AB (ref 0–200)
HDL: 68 (ref 35–70)
LDL Cholesterol: 151
LDL/HDL RATIO: 3.7
Triglycerides: 205 — AB (ref 40–160)

## 2018-01-08 ENCOUNTER — Non-Acute Institutional Stay: Payer: Medicare Other | Admitting: Nurse Practitioner

## 2018-01-08 ENCOUNTER — Encounter: Payer: Self-pay | Admitting: Nurse Practitioner

## 2018-01-08 DIAGNOSIS — I10 Essential (primary) hypertension: Secondary | ICD-10-CM

## 2018-01-08 DIAGNOSIS — E782 Mixed hyperlipidemia: Secondary | ICD-10-CM | POA: Diagnosis not present

## 2018-01-08 NOTE — Assessment & Plan Note (Signed)
01/07/18 Na 140, K 4.5, Bun 16, creat 1.0. Continue Furosemide 20mg , Spironolactone 12.5mg  po daily.

## 2018-01-08 NOTE — Assessment & Plan Note (Signed)
elevated cholesterol 253 LDL 151, triglycerides 205 01/07/18. Currently she is not taking cholesterol lower agent. She stated she exercises regularly, watching her diet closely. Hx of allergic reaction to statin. But there are other cholesterol lowering medications available. Benefit, risk, alternatives discussed with the patient. Her questions were answered to the best of my knowledge. She will think about them for now

## 2018-01-08 NOTE — Progress Notes (Signed)
Location:  [facility] Room Number: [phone] Place of Service:  ALF [phone]) Provider:  Buddie Marston, ManXie  NP  Blanchie Serve, MD  Patient Care Team: Blanchie Serve, MD as PCP - General (Internal Medicine) [name], MD (Ophthalmology) [name], MD (Psychiatry) Mattheo Swindle X, NP as Nurse Practitioner (Nurse Practitioner) [name], MD as Consulting Physician (Orthopedic Surgery)  Extended Emergency Contact Information Primary Emergency Contact: [name], [place] Work Phone: [phone] Relation: Son Secondary Emergency Contact: [name] [place] of [place] Phone: [phone] Relation: Sister  Code Status:  DNR Goals of care: Advanced Directive information Advanced Directives 12/06/2017  Does Patient Have a Medical Advance Directive? Yes  Type of Paramedic of [place];Living will;Out of facility DNR (pink MOST or yellow form)  Does patient want to make changes to medical advance directive? No - Patient declined  Copy of [place] in Chart? Yes  Pre-existing out of facility DNR order (yellow form or pink MOST form) Yellow form placed in chart (order not valid for inpatient use)     Chief Complaint  Patient presents with  . Acute Visit    Elevated Cholestrol ,    HPI:  Pt is a 77 y.o. female seen today for an acute visit for elevated cholesterol 253 LDL 151, triglycerides 205 01/07/18. Currently she is not taking cholesterol lower agent. She stated she exercises regularly, watching her diet closely. She has history of HTN, controlled on Furosemide 20mg , Spironolactone 12.5mg  daily, ASA 81mg . She denied dizziness, headache, chest pain, palpitation, SOB, or cough.    Past Medical History:  Diagnosis Date  . Allergy   . Anemia   . Anxiety   . Asthma   . CAD (coronary artery disease)   . Depression   . Diverticulosis   . Dysfunction of  eustachian tube   . Elevated LFTs   . Esophageal reflux   . Esophageal stricture 2002  . Hemorrhoids   . History of rectal polyps   . HLD (hyperlipidemia)   . HTN (hypertension)   . Hypertonicity of bladder   . Irritable bowel syndrome with constipation   . Obesity   . Optic neuritis   . Osteoporosis   . Peptic ulcer disease   . Peripheral neuropathy   . Trigeminal neuralgia    prior injury  . 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 Vitamin D deficiency    Past Surgical History:  Procedure Laterality Date  . ABDOMINAL HYSTERECTOMY    . ANAL RECTAL MANOMETRY N/A 07/05/2014   Procedure: ANO RECTAL MANOMETRY;  Surgeon: Leighton Ruff, MD;  Location: [facility];  Service: Endoscopy;  Laterality: N/A;  . APPENDECTOMY    . CAROTID ENDARTERECTOMY    . COLONOSCOPY  2008, 2015  . TONSILLECTOMY    . UPPER GASTROINTESTINAL ENDOSCOPY  2002    Allergies  Allergen Reactions  . Azithromycin   . Biaxin [Clarithromycin]   . Clarithromycin   . Doxycycline   . Fosamax [Alendronate Sodium]   . Geodon [Ziprasidone Hydrochloride]   . Lipitor [Atorvastatin Calcium]   . Penicillins   . Pravachol [Pravastatin Sodium]   . Statins   . Sulfonamide Derivatives   . Zocor [Simvastatin]     Outpatient Encounter Medications as of 01/08/2018  Medication Sig  . acetaminophen (TYLENOL) 325 MG tablet Take 650 mg  by mouth every 6 (six) hours as needed.   Marland Kitchen acetaminophen (TYLENOL) 650 MG CR tablet Take 650 mg by mouth 2 (two) times daily.   Marland Kitchen acyclovir ointment (ZOVIRAX) 5 % Apply 1 application topically 6 (six) times daily. As needed  . alum & mag hydroxide-simeth (MYLANTA) 200-200-20 MG/5ML suspension Take 30 mLs by mouth at bedtime.  Marland Kitchen alum & mag hydroxide-simeth (MYLANTA) 200-200-20 MG/5ML suspension Take 30 mLs by mouth every 4 (four) hours as needed for indigestion or heartburn.  Marland Kitchen amitriptyline (ELAVIL) 50 MG tablet Take 50 mg by  mouth at bedtime.  Marland Kitchen aspirin 81 MG EC tablet Take 81 mg by mouth daily.    . bisacodyl (DULCOLAX) 10 MG suppository Place 10 mg rectally daily as needed for moderate constipation.  . bisacodyl (DULCOLAX) 5 MG EC tablet Take 5 mg by mouth daily as needed for moderate constipation.  . calcium-vitamin D (OSCAL WITH D) 250-125 MG-UNIT tablet Take 1 tablet by mouth 2 (two) times daily. Reported on 12/27/2015  . carbamide peroxide (DEBROX) 6.5 % otic solution Place 1-2 drops into both ears. Once a day on Wednesday  . chlordiazePOXIDE (LIBRIUM) 25 MG capsule Take 25 mg by mouth daily as needed (in addition to the scheduled dose at bedtime).   . cyclobenzaprine (FLEXERIL) 5 MG tablet Take 5 mg by mouth 3 (three) times daily as needed for muscle spasms.  Marland Kitchen denosumab (PROLIA) 60 MG/ML SOLN injection Inject 60 mg every 6 (six) months into the skin. Administer in upper arm, thigh, or abdomen  . famotidine (PEPCID) 20 MG tablet Take 20 mg by mouth at bedtime.  . fluPHENAZine (PROLIXIN) 1 MG tablet Take 1 mg by mouth at bedtime.  . fluticasone (FLONASE) 50 MCG/ACT nasal spray Place 1 spray into both nostrils 2 (two) times daily.  . furosemide (LASIX) 20 MG tablet Take 20 mg by mouth daily.   Marland Kitchen HYDROcodone-acetaminophen (NORCO/VICODIN) 5-325 MG tablet Take 1 tablet by mouth every morning.   Marland Kitchen HYDROcodone-acetaminophen (NORCO/VICODIN) 5-325 MG tablet Take 1 tablet by mouth daily as needed for severe pain. In the evening  . hydrocortisone 2.5 % cream Apply 1 application topically 2 (two) times daily as needed.  . hydrocortisone cream 1 % Apply 1 application topically 2 (two) times daily.  Marland Kitchen ibuprofen (ADVIL,MOTRIN) 200 MG tablet Take 200 mg by mouth 3 (three) times daily as needed (pain).  Marland Kitchen linaclotide (LINZESS) 290 MCG CAPS capsule Take 290 mcg by mouth daily.  Marland Kitchen LORazepam (ATIVAN) 0.5 MG tablet Take one tablet by mouth twice daily for anxiety  . magnesium hydroxide (MILK OF MAGNESIA) 400 MG/5ML suspension Take  30 mLs by mouth daily as needed.   . Multiple Vitamins-Minerals (CENTRUM SILVER PO) Take 1 tablet by mouth.    . polyethylene glycol (MIRALAX / GLYCOLAX) packet Take 17 g by mouth 2 (two) times daily.  . polyvinyl alcohol (LIQUIFILM TEARS) 1.4 % ophthalmic solution Place 1 drop into both eyes 3 (three) times daily.  Marland Kitchen saccharomyces boulardii (FLORASTOR) 250 MG capsule Take 250 mg by mouth daily.   Marland Kitchen spironolactone (ALDACTONE) 12.5 mg TABS tablet Take 12.5 mg by mouth daily.  . [DISCONTINUED] loratadine (CLARITIN) 10 MG tablet Take 10 mg by mouth daily as needed for allergies.  . [DISCONTINUED] mometasone (ELOCON) 0.1 % ointment Apply 1 application topically 2 (two) times daily as needed.   No facility-administered encounter medications on file as of 01/08/2018.     Review of Systems  Constitutional: Negative for activity change, appetite  change, chills, diaphoresis, fatigue and fever.  HENT: Positive for hearing loss. Negative for congestion.   Respiratory: Negative for cough, chest tightness, shortness of breath and wheezing.   Cardiovascular: Positive for leg swelling. Negative for chest pain and palpitations.  Musculoskeletal: Negative for gait problem.  Neurological: Negative for dizziness, speech difficulty, weakness and headaches.  Psychiatric/Behavioral: Negative for agitation and behavioral problems.    Immunization History  Administered Date(s) Administered  . Influenza Split 07/23/2013  . Influenza Whole 07/05/2009  . Influenza-Unspecified 07/29/2014, 06/14/2015, 07/11/2016, 07/15/2017  . PPD Test 06/17/2013  . Pneumococcal Conjugate-13 07/22/2017  . Pneumococcal Polysaccharide-23 05/05/2009  . Td 05/05/2009  . Zoster 01/09/2011   Pertinent  Health Maintenance Due  Topic Date Due  . INFLUENZA VACCINE  04/24/2018  . DEXA SCAN  Completed  . PNA vac Low Risk Adult  Completed   Fall Risk  06/04/2017 07/29/2014  Falls in the past year? Yes No  Number falls in past yr: 1 -    Injury with Fall? No -   Functional Status Survey:    Vitals:   01/08/18 1416  BP: 122/78  Pulse: 78  Resp: 19  Temp: 97.7 F (36.5 C)  SpO2: 97%  Weight: 142 lb 9.6 oz (64.7 kg)  Height: 5' 1.5" (1.562 m)   Body mass index is 26.51 kg/m. Physical Exam  Constitutional: She is oriented to person, place, and time. She appears well-developed and well-nourished. No distress.  HENT:  Head: Normocephalic and atraumatic.  Eyes: Pupils are equal, round, and reactive to light. EOM are normal.  Low vision  Neck: Normal range of motion. Neck supple. No JVD present. No thyromegaly present.  Cardiovascular: Normal rate and regular rhythm.  No murmur heard. Pulmonary/Chest: Effort normal and breath sounds normal. She has no wheezes. She has no rales.  Musculoskeletal: She exhibits edema.  Trace edema BLE  Neurological: She is alert and oriented to person, place, and time. No cranial nerve deficit. She exhibits normal muscle tone. Coordination normal.  Skin: Skin is warm. She is not diaphoretic.  Psychiatric: She has a normal mood and affect. Her behavior is normal.    Labs reviewed: Recent Labs    03/21/17 12/01/17 0926 01/07/18  NA 137 141 140  K 4.4 4.0 4.5  CL  --  105  --   CO2  --  27  --   GLUCOSE  --  95  --   BUN 17 19 16   CREATININE 1.1 1.18* 1.0  CALCIUM  --  9.5  --    Recent Labs    02/05/17 12/01/17 0926  AST 24 29  ALT 15 19  ALKPHOS 99 117  BILITOT  --  0.4  PROT  --  8.1  ALBUMIN  --  4.2   Recent Labs    02/05/17 12/01/17 0926  WBC 6.2 5.2  NEUTROABS  --  2.3  HGB 11.4* 14.2  HCT 35* 43.9  MCV  --  93.2  PLT 337 309   Lab Results  Component Value Date   TSH 2.55 05/28/2017   Lab Results  Component Value Date   HGBA1C 5.9 02/05/2017   Lab Results  Component Value Date   CHOL 253 (A) 01/07/2018   HDL 68 01/07/2018   LDLCALC 151 01/07/2018   LDLDIRECT 190.0 03/24/2012   TRIG 205 (A) 01/07/2018   CHOLHDL 4 03/24/2012     Significant Diagnostic Results in last 30 days:  No results found.  Assessment/Plan Hyperlipidemia  elevated  cholesterol 253 LDL 151, triglycerides 205 01/07/18. Currently she is not taking cholesterol lower agent. She stated she exercises regularly, watching her diet closely. Hx of allergic reaction to statin. But there are other cholesterol lowering medications available. Benefit, risk, alternatives discussed with the patient. Her questions were answered to the best of my knowledge. She will think about them for now  Essential hypertension 01/07/18 Na 140, K 4.5, Bun 16, creat 1.0. Continue Furosemide 20mg , Spironolactone 12.5mg  po daily.      Family/ staff Communication: plan of care reviewed with the patient and charge nurse  Labs/tests ordered:  none  Time spend 25 minutes.

## 2018-01-14 ENCOUNTER — Telehealth: Payer: Self-pay | Admitting: Internal Medicine

## 2018-01-14 NOTE — Telephone Encounter (Signed)
[facility] called to schedule appointment.  Stated patient is having abdominal pain.  Mrs. Stanke is a AL patient at [facility].  I spoke with Teresa,patient nurse. Helene Kelp with discuss pt concern with Manxie.  I also spoke with Ivin Booty, CMA. She will discuss with Helene Kelp...cdavis

## 2018-01-16 ENCOUNTER — Encounter: Payer: Medicare Other | Admitting: Nurse Practitioner

## 2018-02-07 ENCOUNTER — Encounter: Payer: Self-pay | Admitting: *Deleted

## 2018-03-07 ENCOUNTER — Encounter: Payer: Self-pay | Admitting: Nurse Practitioner

## 2018-03-07 ENCOUNTER — Non-Acute Institutional Stay: Payer: Medicare Other | Admitting: Nurse Practitioner

## 2018-03-07 DIAGNOSIS — M25561 Pain in right knee: Secondary | ICD-10-CM

## 2018-03-07 DIAGNOSIS — M25562 Pain in left knee: Secondary | ICD-10-CM

## 2018-03-07 DIAGNOSIS — K5909 Other constipation: Secondary | ICD-10-CM | POA: Diagnosis not present

## 2018-03-07 DIAGNOSIS — N318 Other neuromuscular dysfunction of bladder: Secondary | ICD-10-CM

## 2018-03-07 DIAGNOSIS — G8929 Other chronic pain: Secondary | ICD-10-CM

## 2018-03-07 DIAGNOSIS — R609 Edema, unspecified: Secondary | ICD-10-CM | POA: Diagnosis not present

## 2018-03-07 DIAGNOSIS — F418 Other specified anxiety disorders: Secondary | ICD-10-CM | POA: Diagnosis not present

## 2018-03-07 NOTE — Progress Notes (Signed)
Location:  [facility] Room Number: [id number] Place of Service:  ALF [phone]) Provider:  Lindee Leason, ManXie  NP  Blanchie Serve, MD  Patient Care Team: Blanchie Serve, MD as PCP - General (Internal Medicine) [name], MD (Ophthalmology) [name], MD (Psychiatry) Lael Pilch X, NP as Nurse Practitioner (Nurse Practitioner) [name], MD as Consulting Physician (Orthopedic Surgery)  Extended Emergency Contact Information Primary Emergency Contact: [name], [place] Work Phone: [phone] Relation: Son Secondary Emergency Contact: [name] [place] of [place] Phone: [phone] Relation: Sister  Code Status:  DNR Goals of care: Advanced Directive information Advanced Directives 12/06/2017  Does Patient Have a Medical Advance Directive? Yes  Type of Paramedic of [place];Living will;Out of facility DNR (pink MOST or yellow form)  Does patient want to make changes to medical advance directive? No - Patient declined  Copy of [place] in Chart? Yes  Pre-existing out of facility DNR order (yellow form or pink MOST form) Yellow form placed in chart (order not valid for inpatient use)     Chief Complaint  Patient presents with  . Medical Management of Chronic Issues    F/U- depression w/anxiety, HTN, GERD    HPI:  Pt is a 77 y.o. female seen today for medical management of chronic diseases.     Hx of knee pain, managed with Norco 5/325mg  qd, Tylenol 650mg  bid, ambulates with walker. Lower vision, audio books to entrain self, able to get around in her familiar environment. Nocturnal urination about 1x at 3-4am, unable to return to sleep usually, but she will take a nap during day to get rest  Hx of leg edema, mild, on Spironolactone 12.5mg , Furosemide 20mg  qd. Hx of constipation, stable on Linzess 263mcg qd. Her mood is stable on Amitriptyline 50mg  qhs and  Lorazepam 0.5mg  bid, Fluphenazine 1mg  hs, Librium 25mg  prn. Past Medical History:  Diagnosis Date  . Allergy   . Anemia   . Anxiety   . Asthma   . CAD (coronary artery disease)   . Depression   . Diverticulosis   . Dysfunction of eustachian tube   . Elevated LFTs   . Esophageal reflux   . Esophageal stricture 2002  . Hemorrhoids   . History of rectal polyps   . HLD (hyperlipidemia)   . HTN (hypertension)   . Hypertonicity of bladder   . Irritable bowel syndrome with constipation   . Obesity   . Optic neuritis   . Osteoporosis   . Peptic ulcer disease   . Peripheral neuropathy   . Trigeminal neuralgia    prior injury  . 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 Vitamin D deficiency    Past Surgical History:  Procedure Laterality Date  . ABDOMINAL HYSTERECTOMY    . ANAL RECTAL MANOMETRY N/A 07/05/2014   Procedure: ANO RECTAL MANOMETRY;  Surgeon: Leighton Ruff, MD;  Location: [facility];  Service: Endoscopy;  Laterality: N/A;  . APPENDECTOMY    . CAROTID ENDARTERECTOMY    . COLONOSCOPY  2008, 2015  . TONSILLECTOMY    . UPPER GASTROINTESTINAL ENDOSCOPY  2002    Allergies  Allergen Reactions  . Azithromycin   . Biaxin [Clarithromycin]   . Clarithromycin   . Doxycycline   . Fosamax [Alendronate Sodium]   . Geodon [Ziprasidone Hydrochloride]   . Lipitor [  Atorvastatin Calcium]   . Penicillins   . Pravachol [Pravastatin Sodium]   . Statins   . Sulfonamide Derivatives   . Zocor [Simvastatin]     Outpatient Encounter Medications as of 03/07/2018  Medication Sig  . acetaminophen (TYLENOL) 325 MG tablet Take 650 mg by mouth every 6 (six) hours as needed.   Marland Kitchen acetaminophen (TYLENOL) 650 MG CR tablet Take 650 mg by mouth 2 (two) times daily.   Marland Kitchen acyclovir ointment (ZOVIRAX) 5 % Apply 1 application topically 6 (six) times daily. As needed  . alum & mag hydroxide-simeth (MYLANTA) 200-200-20 MG/5ML suspension  Take 30 mLs by mouth at bedtime.  Marland Kitchen alum & mag hydroxide-simeth (MYLANTA) 200-200-20 MG/5ML suspension Take 30 mLs by mouth every 4 (four) hours as needed for indigestion or heartburn.  Marland Kitchen amitriptyline (ELAVIL) 50 MG tablet Take 50 mg by mouth at bedtime.  Marland Kitchen aspirin 81 MG EC tablet Take 81 mg by mouth daily.    . bisacodyl (DULCOLAX) 10 MG suppository Place 10 mg rectally daily as needed for moderate constipation.  . bisacodyl (DULCOLAX) 5 MG EC tablet Take 5 mg by mouth daily as needed for moderate constipation.  . calcium-vitamin D (OSCAL WITH D) 250-125 MG-UNIT tablet Take 1 tablet by mouth 2 (two) times daily. Reported on 12/27/2015  . carbamide peroxide (DEBROX) 6.5 % otic solution Place 1-2 drops into both ears. Once a day on Wednesday  . chlordiazePOXIDE (LIBRIUM) 25 MG capsule Take 25 mg by mouth daily as needed (in addition to the scheduled dose at bedtime).   . cyclobenzaprine (FLEXERIL) 5 MG tablet Take 5 mg by mouth 3 (three) times daily as needed for muscle spasms.  Marland Kitchen denosumab (PROLIA) 60 MG/ML SOLN injection Inject 60 mg every 6 (six) months into the skin. Administer in upper arm, thigh, or abdomen  . famotidine (PEPCID) 20 MG tablet Take 20 mg by mouth at bedtime.  . fluPHENAZine (PROLIXIN) 1 MG tablet Take 1 mg by mouth at bedtime.  . fluticasone (FLONASE) 50 MCG/ACT nasal spray Place 1 spray into both nostrils 2 (two) times daily.  . furosemide (LASIX) 20 MG tablet Take 20 mg by mouth daily.   Marland Kitchen HYDROcodone-acetaminophen (NORCO/VICODIN) 5-325 MG tablet Take 1 tablet by mouth every morning.   Marland Kitchen HYDROcodone-acetaminophen (NORCO/VICODIN) 5-325 MG tablet Take 1 tablet by mouth daily as needed for severe pain. In the evening  . hydrocortisone 2.5 % cream Apply 1 application topically 2 (two) times daily as needed.  . hydrocortisone cream 1 % Apply 1 application topically 2 (two) times daily.  Marland Kitchen ibuprofen (ADVIL,MOTRIN) 200 MG tablet Take 200 mg by mouth 3 (three) times daily as needed  (pain).  Marland Kitchen linaclotide (LINZESS) 290 MCG CAPS capsule Take 290 mcg by mouth daily.  Marland Kitchen LORazepam (ATIVAN) 0.5 MG tablet Take one tablet by mouth twice daily for anxiety  . magnesium hydroxide (MILK OF MAGNESIA) 400 MG/5ML suspension Take 30 mLs by mouth daily as needed.   . Multiple Vitamins-Minerals (CENTRUM SILVER PO) Take 1 tablet by mouth.    . polyethylene glycol (MIRALAX / GLYCOLAX) packet Take 17 g by mouth 2 (two) times daily.  . polyvinyl alcohol (LIQUIFILM TEARS) 1.4 % ophthalmic solution Place 1 drop into both eyes 3 (three) times daily.  Marland Kitchen saccharomyces boulardii (FLORASTOR) 250 MG capsule Take 250 mg by mouth daily.   Marland Kitchen spironolactone (ALDACTONE) 12.5 mg TABS tablet Take 12.5 mg by mouth daily.   No facility-administered encounter medications on file as of 03/07/2018.  Review of Systems  Constitutional: Negative for activity change, appetite change, chills, diaphoresis and fatigue.  HENT: Positive for hearing loss. Negative for congestion, trouble swallowing and voice change.   Eyes: Positive for visual disturbance.       Low vision  Respiratory: Negative for cough, shortness of breath and wheezing.   Cardiovascular: Positive for leg swelling. Negative for chest pain and palpitations.  Gastrointestinal: Negative for abdominal distention, abdominal pain, constipation, diarrhea, nausea and vomiting.  Genitourinary: Negative for difficulty urinating, dysuria and urgency.  Musculoskeletal: Positive for arthralgias and gait problem.  Skin: Negative for color change and pallor.  Neurological: Negative for dizziness, facial asymmetry, speech difficulty, weakness and headaches.       Memory lapses, mild.   Psychiatric/Behavioral: Positive for sleep disturbance. Negative for agitation, behavioral problems and hallucinations. The patient is not nervous/anxious.     Immunization History  Administered Date(s) Administered  . Influenza Split 07/23/2013  . Influenza Whole 07/05/2009    . Influenza-Unspecified 07/29/2014, 06/14/2015, 07/11/2016, 07/15/2017  . PPD Test 06/17/2013  . Pneumococcal Conjugate-13 07/22/2017  . Pneumococcal Polysaccharide-23 05/05/2009  . Td 05/05/2009  . Zoster 01/09/2011   Pertinent  Health Maintenance Due  Topic Date Due  . INFLUENZA VACCINE  04/24/2018  . DEXA SCAN  Completed  . PNA vac Low Risk Adult  Completed   Fall Risk  06/04/2017 07/29/2014  Falls in the past year? Yes No  Number falls in past yr: 1 -  Injury with Fall? No -   Functional Status Survey:    Vitals:   03/07/18 1334  BP: 112/68  Pulse: 78  Resp: 18  Temp: 98.1 F (36.7 C)  Weight: 141 lb 9.6 oz (64.2 kg)  Height: 5' 1.5" (1.562 m)   Body mass index is 26.32 kg/m. Physical Exam  Constitutional: She appears well-developed and well-nourished.  HENT:  Head: Normocephalic and atraumatic.  Eyes: Pupils are equal, round, and reactive to light. EOM are normal.  Legally blind.   Neck: Normal range of motion. Neck supple. No JVD present. No thyromegaly present.  Cardiovascular: Normal rate and regular rhythm.  No murmur heard. Pulmonary/Chest: Effort normal and breath sounds normal. She has no wheezes. She has no rales.  Abdominal: Soft. Bowel sounds are normal. She exhibits no distension and no mass. There is no tenderness.  Musculoskeletal: She exhibits edema and tenderness.  Trace edema BLE. Chronic knee pain. Ambulates with walker.   Neurological: She is alert. No cranial nerve deficit. She exhibits normal muscle tone. Coordination normal.  Oriented to person and place.   Skin: Skin is warm and dry.  Psychiatric: She has a normal mood and affect. Her behavior is normal.    Labs reviewed: Recent Labs    03/21/17 12/01/17 0926 01/07/18  NA 137 141 140  K 4.4 4.0 4.5  CL  --  105  --   CO2  --  27  --   GLUCOSE  --  95  --   BUN 17 19 16   CREATININE 1.1 1.18* 1.0  CALCIUM  --  9.5  --    Recent Labs    12/01/17 0926  AST 29  ALT 19   ALKPHOS 117  BILITOT 0.4  PROT 8.1  ALBUMIN 4.2   Recent Labs    12/01/17 0926  WBC 5.2  NEUTROABS 2.3  HGB 14.2  HCT 43.9  MCV 93.2  PLT 309   Lab Results  Component Value Date   TSH 2.55 05/28/2017  Lab Results  Component Value Date   HGBA1C 5.9 02/05/2017   Lab Results  Component Value Date   CHOL 253 (A) 01/07/2018   HDL 68 01/07/2018   LDLCALC 151 01/07/2018   LDLDIRECT 190.0 03/24/2012   TRIG 205 (A) 01/07/2018   CHOLHDL 4 03/24/2012    Significant Diagnostic Results in last 30 days:  No results found.  Assessment/Plan Chronic pain of both knees knee pain, continue  Norco 5/325mg  qd, Tylenol 650mg  bid, ambulates with walker  PERIPHERAL EDEMA Hx of leg edema, mild, continue Spironolactone 12.5mg , Furosemide 20mg  qd.  Depression with anxiety Her mood is stable, continu eAmitriptyline 50mg  qhs and Lorazepam 0.5mg  bid, Fluphenazine 1mg  hs, Librium 25mg  prn.  Hypertonicity of bladder Occasional urinary leakage, about 1x/night urination.   Constipation constipation, stable, continue Linzess 225mcg qd     Family/ staff Communication: plan of care reviewed with the patient and charge nurse.    Labs/tests ordered:  none  Time spend 25 minutes.

## 2018-03-07 NOTE — Assessment & Plan Note (Signed)
knee pain, continue  Norco 5/325mg  qd, Tylenol 650mg  bid, ambulates with walker

## 2018-03-07 NOTE — Assessment & Plan Note (Signed)
Hx of leg edema, mild, continue Spironolactone 12.5mg , Furosemide 20mg  qd.

## 2018-03-07 NOTE — Assessment & Plan Note (Signed)
Her mood is stable, continu eAmitriptyline 50mg  qhs and Lorazepam 0.5mg  bid, Fluphenazine 1mg  hs, Librium 25mg  prn.

## 2018-03-07 NOTE — Assessment & Plan Note (Signed)
Occasional urinary leakage, about 1x/night urination.

## 2018-03-07 NOTE — Assessment & Plan Note (Signed)
constipation, stable, continue Linzess 258mcg qd

## 2018-04-10 ENCOUNTER — Other Ambulatory Visit: Payer: Self-pay

## 2018-04-10 MED ORDER — HYDROCODONE-ACETAMINOPHEN 5-325 MG PO TABS: 1.0000 | ORAL_TABLET | ORAL | 0 refills | Status: DC

## 2018-04-15 ENCOUNTER — Other Ambulatory Visit: Payer: Self-pay

## 2018-04-15 MED ORDER — LORAZEPAM 0.5 MG PO TABS: ORAL_TABLET | ORAL | 0 refills | Status: DC

## 2018-05-07 ENCOUNTER — Encounter: Payer: Self-pay | Admitting: Internal Medicine

## 2018-06-10 ENCOUNTER — Non-Acute Institutional Stay: Payer: Medicare Other

## 2018-06-10 DIAGNOSIS — Z0000 Encounter for general adult medical examination without abnormal findings: Secondary | ICD-10-CM | POA: Diagnosis not present

## 2018-06-10 NOTE — Progress Notes (Signed)
Subjective:   Jean [name] is a 77 y.o. female who presents for Medicare Annual (Subsequent) preventive examination at [facility]-06/04/2017    Objective:     Vitals: BP 122/76 (BP Location: Left Arm, Patient Position: Sitting)   Pulse 78   Temp 97.9 F (36.6 C) (Oral)   Ht 5\' 1"  (1.549 m)   Wt 141 lb (64 kg)   BMI 26.64 kg/m   Body mass index is 26.64 kg/m.  Advanced Directives 06/10/2018 12/06/2017 12/01/2017 08/21/2017 06/14/2017 06/04/2017 05/23/2017  Does Patient Have a Medical Advance Directive? Yes Yes Yes Yes Yes Yes Yes  Type of Paramedic of [place];Out of facility DNR (pink MOST or yellow form) [place];Living will;Out of facility DNR (pink MOST or yellow form) Out of facility DNR (pink MOST or yellow form) [place];Out of facility DNR (pink MOST or yellow form) [place];Out of facility DNR (pink MOST or yellow form) Out of facility DNR (pink MOST or yellow form);Healthcare Power of [employer] of facility DNR (pink MOST or yellow form)  Does patient want to make changes to medical advance directive? No - Patient declined No - Patient declined - No - Patient declined No - Patient declined No - Patient declined No - Patient declined  Copy of [place] in Chart? Yes Yes - Yes Yes Yes -  Pre-existing out of facility DNR order (yellow form or pink MOST form) Yellow form placed in chart (order not valid for inpatient use) Yellow form placed in chart (order not valid for inpatient use) Yellow form placed in chart (order not valid for inpatient use) Yellow form placed in chart (order not valid for inpatient use);Pink MOST form placed in chart (order not valid for inpatient use) Yellow form placed in chart (order not valid for inpatient use);Pink MOST form placed in chart (order not valid for inpatient use) Yellow form placed in chart (order not valid  for inpatient use);Pink MOST form placed in chart (order not valid for inpatient use) Yellow form placed in chart (order not valid for inpatient use)    Tobacco Social History   Tobacco Use  Smoking Status Never Smoker  Smokeless Tobacco Never Used     Counseling given: Not Answered   Clinical Intake:  Pre-visit preparation completed: No  Pain : No/denies pain     Nutritional Risks: None Diabetes: No     Interpreter Needed?: No  Information entered by :: Tyson Dense, RN  Past Medical History:  Diagnosis Date  . Allergy   . Anemia   . Anxiety   . Asthma   . CAD (coronary artery disease)   . Depression   . Diverticulosis   . Dysfunction of eustachian tube   . Elevated LFTs   . Esophageal reflux   . Esophageal stricture 2002  . Hemorrhoids   . History of rectal polyps   . HLD (hyperlipidemia)   . HTN (hypertension)   . Hypertonicity of bladder   . Irritable bowel syndrome with constipation   . Obesity   . Optic neuritis   . Osteoporosis   . Peptic ulcer disease   . Peripheral neuropathy   . Trigeminal neuralgia    prior injury  . 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 Vitamin D deficiency    Past Surgical History:  Procedure Laterality Date  .  ABDOMINAL HYSTERECTOMY    . ANAL RECTAL MANOMETRY N/A 07/05/2014   Procedure: ANO RECTAL MANOMETRY;  Surgeon: Leighton Ruff, MD;  Location: [facility];  Service: Endoscopy;  Laterality: N/A;  . APPENDECTOMY    . CAROTID ENDARTERECTOMY    . COLONOSCOPY  2008, 2015  . TONSILLECTOMY    . UPPER GASTROINTESTINAL ENDOSCOPY  2002   Family History  Problem Relation Age of Onset  . Alzheimer's disease Mother   . Coronary artery disease Father   . Heart attack Father   . Heart disease Father   . Anuerysm Father        AAA  . Ovarian cancer Sister   . Lung cancer Brother   . Heart disease Brother   . Anuerysm Brother        AAA  . Diabetes Neg  Hx   . Colon cancer Neg Hx    Social History   Socioeconomic History  . Marital status: Divorced    Spouse name: Not on file  . Number of children: 2  . Years of education: Not on file  . Highest education level: Not on file  Occupational History  . Occupation: retired    Fish farm manager: RETIRED  Social Needs  . Financial resource strain: Not hard at all  . Food insecurity:    Worry: Never true    Inability: Never true  . Transportation needs:    Medical: No    Non-medical: No  Tobacco Use  . Smoking status: Never Smoker  . Smokeless tobacco: Never Used  Substance and Sexual Activity  . Alcohol use: No    Alcohol/week: 0.0 standard drinks  . Drug use: No  . Sexual activity: Never  Lifestyle  . Physical activity:    Days per week: 5 days    Minutes per session: 30 min  . Stress: Not at all  Relationships  . Social connections:    Talks on phone: Twice a week    Gets together: Twice a week    Attends religious service: Never    Active member of club or organization: No    Attends meetings of clubs or organizations: Never    Relationship status: Divorced  Other Topics Concern  . Not on file  Social History Narrative   HSG, business college   Married 59-81yrs/divorced   2 son-'60; 2 granddaughters   Work: Network engineer, travel clerk   [place] home in a studio apartment, moved to [place] 04/2013   Patient never smoked. Did have second hand smoke exposure childhood and work   Alcohol none   Walks with walker    Outpatient Encounter Medications as of 06/10/2018  Medication Sig  . acetaminophen (TYLENOL) 325 MG tablet Take 650 mg by mouth every 6 (six) hours as needed.   Marland Kitchen acetaminophen (TYLENOL) 650 MG CR tablet Take 650 mg by mouth 2 (two) times daily.   Marland Kitchen acyclovir ointment (ZOVIRAX) 5 % Apply 1 application topically 6 (six) times daily. As needed  . alum & mag hydroxide-simeth (MYLANTA) 200-200-20 MG/5ML suspension Take 30 mLs by mouth at bedtime.  Marland Kitchen alum & mag  hydroxide-simeth (MYLANTA) 200-200-20 MG/5ML suspension Take 30 mLs by mouth every 4 (four) hours as needed for indigestion or heartburn.  Marland Kitchen amitriptyline (ELAVIL) 50 MG tablet Take 50 mg by mouth at bedtime.  Marland Kitchen aspirin 81 MG EC tablet Take 81 mg by mouth daily.    . bisacodyl (DULCOLAX) 10 MG suppository Place 10 mg rectally daily as needed for moderate constipation.  Marland Kitchen  bisacodyl (DULCOLAX) 5 MG EC tablet Take 5 mg by mouth daily as needed for moderate constipation.  . calcium-vitamin D (OSCAL WITH D) 250-125 MG-UNIT tablet Take 1 tablet by mouth 2 (two) times daily. Reported on 12/27/2015  . carbamide peroxide (DEBROX) 6.5 % otic solution Place 1-2 drops into both ears. Once a day on Wednesday  . chlordiazePOXIDE (LIBRIUM) 25 MG capsule Take 25 mg by mouth daily as needed (in addition to the scheduled dose at bedtime).   . cyclobenzaprine (FLEXERIL) 5 MG tablet Take 5 mg by mouth 3 (three) times daily as needed for muscle spasms.  Marland Kitchen denosumab (PROLIA) 60 MG/ML SOLN injection Inject 60 mg every 6 (six) months into the skin. Administer in upper arm, thigh, or abdomen  . famotidine (PEPCID) 20 MG tablet Take 20 mg by mouth at bedtime.  . fluPHENAZine (PROLIXIN) 1 MG tablet Take 1 mg by mouth at bedtime.  . fluticasone (FLONASE) 50 MCG/ACT nasal spray Place 1 spray into both nostrils 2 (two) times daily.  . furosemide (LASIX) 20 MG tablet Take 20 mg by mouth daily.   Marland Kitchen HYDROcodone-acetaminophen (NORCO/VICODIN) 5-325 MG tablet Take 1 tablet by mouth daily as needed for severe pain. In the evening  . HYDROcodone-acetaminophen (NORCO/VICODIN) 5-325 MG tablet Take 1 tablet by mouth every morning.  . hydrocortisone 2.5 % cream Apply 1 application topically 2 (two) times daily as needed.  . hydrocortisone cream 1 % Apply 1 application topically 2 (two) times daily.  Marland Kitchen ibuprofen (ADVIL,MOTRIN) 200 MG tablet Take 200 mg by mouth 3 (three) times daily as needed (pain).  Marland Kitchen linaclotide (LINZESS) 290 MCG CAPS  capsule Take 290 mcg by mouth daily.  Marland Kitchen LORazepam (ATIVAN) 0.5 MG tablet Take one tablet by mouth twice daily for anxiety  . magnesium hydroxide (MILK OF MAGNESIA) 400 MG/5ML suspension Take 30 mLs by mouth daily as needed.   . Multiple Vitamins-Minerals (CENTRUM SILVER PO) Take 1 tablet by mouth.    . polyethylene glycol (MIRALAX / GLYCOLAX) packet Take 17 g by mouth 2 (two) times daily.  . polyvinyl alcohol (LIQUIFILM TEARS) 1.4 % ophthalmic solution Place 1 drop into both eyes 3 (three) times daily.  Marland Kitchen saccharomyces boulardii (FLORASTOR) 250 MG capsule Take 250 mg by mouth daily.   Marland Kitchen spironolactone (ALDACTONE) 12.5 mg TABS tablet Take 12.5 mg by mouth daily.   No facility-administered encounter medications on file as of 06/10/2018.     Activities of Daily Living In your present state of health, do you have any difficulty performing the following activities: 06/10/2018  Hearing? N  Vision? Y  Difficulty concentrating or making decisions? N  Walking or climbing stairs? Y  Dressing or bathing? Y  Doing errands, shopping? Y  Preparing Food and eating ? Y  Using the Toilet? N  In the past six months, have you accidently leaked urine? N  Do you have problems with loss of bowel control? N  Managing your Medications? Y  Managing your Finances? Y  Housekeeping or managing your Housekeeping? Y  Some recent data might be hidden    Patient Care Team: Blanchie Serve, MD as PCP - General (Internal Medicine) [name], MD (Ophthalmology) [name], MD (Psychiatry) Mast, Man X, NP as Nurse Practitioner (Nurse Practitioner) [name], MD as Consulting Physician (Orthopedic Surgery)    Assessment:   This is a routine wellness examination for Malayla.  Exercise Activities and Dietary recommendations Current Exercise Habits: Structured exercise class, Type of exercise: strength training/weights, Time (Minutes): 30,  Frequency (Times/Week): 5, Weekly Exercise (Minutes/Week): 150,  Intensity: Mild, Exercise limited by: orthopedic condition(s)  Goals   None     Fall Risk Fall Risk  06/10/2018 06/04/2017 07/29/2014  Falls in the past year? No Yes No  Number falls in past yr: - 1 -  Injury with Fall? - No -   Is the patient's home free of loose throw rugs in walkways, pet beds, electrical cords, etc?   yes      Grab bars in the bathroom? yes      Handrails on the stairs?   yes      Adequate lighting?   yes  Depression Screen PHQ 2/9 Scores 06/10/2018 06/04/2017 07/29/2014  PHQ - 2 Score 0 0 0     Cognitive Function completed within last year MMSE - [place] Exam 02/07/2018 06/04/2017  Not completed: Unable to complete -  Orientation to time 5 4  Orientation to Place 5 5  Registration 3 3  Attention/ Calculation 5 5  Recall 3 2  Language- name 2 objects 2 2  Language- repeat 1 1  Language- follow 3 step command 3 3  Language- read & follow direction 0 0  Write a sentence 0 0  Copy design 0 0  Total score 27 25        Immunization History  Administered Date(s) Administered  . Influenza Split 07/23/2013  . Influenza Whole 07/05/2009  . Influenza-Unspecified 07/29/2014, 06/14/2015, 07/11/2016, 07/15/2017  . PPD Test 06/17/2013  . Pneumococcal Conjugate-13 07/22/2017  . Pneumococcal Polysaccharide-23 05/05/2009  . Td 05/05/2009  . Zoster 01/09/2011    Qualifies for Shingles Vaccine? Not in past records  Screening Tests Health Maintenance  Topic Date Due  . INFLUENZA VACCINE  04/24/2018  . TETANUS/TDAP  05/06/2019  . DEXA SCAN  Completed  . PNA vac Low Risk Adult  Completed    Cancer Screenings: Lung: Low Dose CT Chest recommended if Age 71-80 years, 30 pack-year currently smoking OR have quit w/in 15years. Patient does not qualify. Breast:  Up to date on Mammogram? Yes   Up to date of Bone Density/Dexa? Yes Colorectal: up to date  Additional Screenings:  Hepatitis C Screening: declined Flu vaccine: will receive at [facility]:    I have personally reviewed and addressed the Medicare Annual Wellness questionnaire and have noted the following in the patient's chart:  A. Medical and social history B. Use of alcohol, tobacco or illicit drugs  C. Current medications and supplements D. Functional ability and status E.  Nutritional status F.  Physical activity G. Advance directives H. List of other physicians I.  Hospitalizations, surgeries, and ER visits in previous 12 months J.  [place] to include hearing, vision, cognitive, depression L. Referrals and appointments - none  In addition, I have reviewed and discussed with patient certain preventive protocols, quality metrics, and best practice recommendations. A written personalized care plan for preventive services as well as general preventive health recommendations were provided to patient.  See attached scanned questionnaire for additional information.   Signed,   Tyson Dense, RN Nurse Health Advisor  Patient Concerns: None

## 2018-06-10 NOTE — Patient Instructions (Addendum)
Jean [name] , Thank you for taking time to come for your Medicare Wellness Visit. I appreciate your ongoing commitment to your health goals. Please review the following plan we discussed and let me know if I can assist you in the future.   Screening recommendations/referrals: Colonoscopy excluded, over age 79 Mammogram excluded, over age 24 Bone Density up to date Recommended yearly ophthalmology/optometry visit for glaucoma screening and checkup Recommended yearly dental visit for hygiene and checkup  Vaccinations: Influenza vaccine due, will receive at [facility] Pneumococcal vaccine up to date, completed Tdap vaccine up to date, due 05/06/2019 Shingles vaccine not in past records    Advanced directives: in chart  Conditions/risks identified: none  Next appointment: Dr. Bubba Camp makes rounds   Preventive Care 29 Years and Older, Female Preventive care refers to lifestyle choices and visits with your health care provider that can promote health and wellness. What does preventive care include?  A yearly physical exam. This is also called an annual well check.  Dental exams once or twice a year.  Routine eye exams. Ask your health care provider how often you should have your eyes checked.  Personal lifestyle choices, including:  Daily care of your teeth and gums.  Regular physical activity.  Eating a healthy diet.  Avoiding tobacco and drug use.  Limiting alcohol use.  Practicing safe sex.  Taking low-dose aspirin every day.  Taking vitamin and mineral supplements as recommended by your health care provider. What happens during an annual well check? The services and screenings done by your health care provider during your annual well check will depend on your age, overall health, lifestyle risk factors, and family history of disease. Counseling  Your health care provider may ask you questions about your:  Alcohol use.  Tobacco use.  Drug use.  Emotional  well-being.  Home and relationship well-being.  Sexual activity.  Eating habits.  History of falls.  Memory and ability to understand (cognition).  Work and work Statistician.  Reproductive health. Screening  You may have the following tests or measurements:  Height, weight, and BMI.  Blood pressure.  Lipid and cholesterol levels. These may be checked every 5 years, or more frequently if you are over 62 years old.  Skin check.  Lung cancer screening. You may have this screening every year starting at age 2 if you have a 30-pack-year history of smoking and currently smoke or have quit within the past 15 years.  Fecal occult blood test (FOBT) of the stool. You may have this test every year starting at age 53.  Flexible sigmoidoscopy or colonoscopy. You may have a sigmoidoscopy every 5 years or a colonoscopy every 10 years starting at age 49.  Hepatitis C blood test.  Hepatitis B blood test.  Sexually transmitted disease (STD) testing.  Diabetes screening. This is done by checking your blood sugar (glucose) after you have not eaten for a while (fasting). You may have this done every 1-3 years.  Bone density scan. This is done to screen for osteoporosis. You may have this done starting at age 69.  Mammogram. This may be done every 1-2 years. Talk to your health care provider about how often you should have regular mammograms. Talk with your health care provider about your test results, treatment options, and if necessary, the need for more tests. Vaccines  Your health care provider may recommend certain vaccines, such as:  Influenza vaccine. This is recommended every year.  Tetanus, diphtheria, and acellular pertussis (Tdap, Td) vaccine.  You may need a Td booster every 10 years.  Zoster vaccine. You may need this after age 22.  Pneumococcal 13-valent conjugate (PCV13) vaccine. One dose is recommended after age 76.  Pneumococcal polysaccharide (PPSV23) vaccine. One  dose is recommended after age 69. Talk to your health care provider about which screenings and vaccines you need and how often you need them. This information is not intended to replace advice given to you by your health care provider. Make sure you discuss any questions you have with your health care provider. Document Released: 10/07/2015 Document Revised: 05/30/2016 Document Reviewed: 07/12/2015 Elsevier Interactive Patient Education  2017 [place] Prevention in the Home Falls can cause injuries. They can happen to people of all ages. There are many things you can do to make your home safe and to help prevent falls. What can I do on the outside of my home?  Regularly fix the edges of walkways and driveways and fix any cracks.  Remove anything that might make you trip as you walk through a door, such as a raised step or threshold.  Trim any bushes or trees on the path to your home.  Use bright outdoor lighting.  Clear any walking paths of anything that might make someone trip, such as rocks or tools.  Regularly check to see if handrails are loose or broken. Make sure that both sides of any steps have handrails.  Any raised decks and porches should have guardrails on the edges.  Have any leaves, snow, or ice cleared regularly.  Use sand or salt on walking paths during winter.  Clean up any spills in your garage right away. This includes oil or grease spills. What can I do in the bathroom?  Use night lights.  Install grab bars by the toilet and in the tub and shower. Do not use towel bars as grab bars.  Use non-skid mats or decals in the tub or shower.  If you need to sit down in the shower, use a plastic, non-slip stool.  Keep the floor dry. Clean up any water that spills on the floor as soon as it happens.  Remove soap buildup in the tub or shower regularly.  Attach bath mats securely with double-sided non-slip rug tape.  Do not have throw rugs and other  things on the floor that can make you trip. What can I do in the bedroom?  Use night lights.  Make sure that you have a light by your bed that is easy to reach.  Do not use any sheets or blankets that are too big for your bed. They should not hang down onto the floor.  Have a firm chair that has side arms. You can use this for support while you get dressed.  Do not have throw rugs and other things on the floor that can make you trip. What can I do in the kitchen?  Clean up any spills right away.  Avoid walking on wet floors.  Keep items that you use a lot in easy-to-reach places.  If you need to reach something above you, use a strong step stool that has a grab bar.  Keep electrical cords out of the way.  Do not use floor polish or wax that makes floors slippery. If you must use wax, use non-skid floor wax.  Do not have throw rugs and other things on the floor that can make you trip. What can I do with my stairs?  Do not leave any  items on the stairs.  Make sure that there are handrails on both sides of the stairs and use them. Fix handrails that are broken or loose. Make sure that handrails are as long as the stairways.  Check any carpeting to make sure that it is firmly attached to the stairs. Fix any carpet that is loose or worn.  Avoid having throw rugs at the top or bottom of the stairs. If you do have throw rugs, attach them to the floor with carpet tape.  Make sure that you have a light switch at the top of the stairs and the bottom of the stairs. If you do not have them, ask someone to add them for you. What else can I do to help prevent falls?  Wear shoes that:  Do not have high heels.  Have rubber bottoms.  Are comfortable and fit you well.  Are closed at the toe. Do not wear sandals.  If you use a stepladder:  Make sure that it is fully opened. Do not climb a closed stepladder.  Make sure that both sides of the stepladder are locked into place.  Ask  someone to hold it for you, if possible.  Clearly mark and make sure that you can see:  Any grab bars or handrails.  First and last steps.  Where the edge of each step is.  Use tools that help you move around (mobility aids) if they are needed. These include:  Canes.  Walkers.  Scooters.  Crutches.  Turn on the lights when you go into a dark area. Replace any light bulbs as soon as they burn out.  Set up your furniture so you have a clear path. Avoid moving your furniture around.  If any of your floors are uneven, fix them.  If there are any pets around you, be aware of where they are.  Review your medicines with your doctor. Some medicines can make you feel dizzy. This can increase your chance of falling. Ask your doctor what other things that you can do to help prevent falls. This information is not intended to replace advice given to you by your health care provider. Make sure you discuss any questions you have with your health care provider. Document Released: 07/07/2009 Document Revised: 02/16/2016 Document Reviewed: 10/15/2014 Elsevier Interactive Patient Education  2017 [employer].

## 2018-06-19 ENCOUNTER — Non-Acute Institutional Stay: Payer: Medicare Other | Admitting: Internal Medicine

## 2018-06-19 ENCOUNTER — Encounter: Payer: Self-pay | Admitting: Internal Medicine

## 2018-06-19 DIAGNOSIS — M170 Bilateral primary osteoarthritis of knee: Secondary | ICD-10-CM | POA: Diagnosis not present

## 2018-06-19 DIAGNOSIS — E663 Overweight: Secondary | ICD-10-CM

## 2018-06-19 DIAGNOSIS — F411 Generalized anxiety disorder: Secondary | ICD-10-CM | POA: Insufficient documentation

## 2018-06-19 DIAGNOSIS — K5909 Other constipation: Secondary | ICD-10-CM

## 2018-06-19 DIAGNOSIS — K219 Gastro-esophageal reflux disease without esophagitis: Secondary | ICD-10-CM | POA: Diagnosis not present

## 2018-06-19 NOTE — Progress Notes (Addendum)
Location:  [facility] Room Number: [place] of Service:  ALF [phone]) Provider:  Blanchie Serve MD  Blanchie Serve, MD  Patient Care Team: Blanchie Serve, MD as PCP - General (Internal Medicine) [name], MD (Ophthalmology) [name], MD (Psychiatry) Mast, Man X, NP as Nurse Practitioner (Nurse Practitioner) [name], MD as Consulting Physician (Orthopedic Surgery)  Extended Emergency Contact Information Primary Emergency Contact: [name], [place] Work Phone: [phone] Relation: Son Secondary Emergency Contact: [name] [place] of [place] Phone: [phone] Relation: Sister  Code Status:  DNR  Goals of care: Advanced Directive information Advanced Directives 06/19/2018  Does Patient Have a Medical Advance Directive? Yes  Type of Paramedic of [place];Living will;Out of facility DNR (pink MOST or yellow form)  Does patient want to make changes to medical advance directive? -  Copy of [place] in Chart? Yes  Pre-existing out of facility DNR order (yellow form or pink MOST form) Yellow form placed in chart (order not valid for inpatient use)     Chief Complaint  Patient presents with  . Medical Management of Chronic Issues    Resident is being seen for a routine visit.     HPI:  Pt is a 77 y.o. female seen today for medical management of chronic diseases. She is seen in her room with charge nurse present.   GAD- takes lorazepam 0.5 mg bid, fluphenazine and amitriptyline 50 mg qhs, mood stable. Also on chlordiazepoxide 25 mg qhs and qd prn dosing. Has not required the prn dosing.   OA- to both knees. Uses walker for ambulation. takes tylenol 650 mg bid with 650 mg q6h prn pain. Also on norco 5-325 mg daily and flexeril 5 mg tid prn. Has not required her prn flexeril. Denies muscle spasm or tightness.   Chronic constipation-  takes linzess and miralax, had a bowel movement yesterday  gerd- controlled symptom, on famotidine 20 mg daily   Past Medical History:  Diagnosis Date  . Allergy   . Anemia   . Anxiety   . Asthma   . CAD (coronary artery disease)   . Depression   . Diverticulosis   . Dysfunction of eustachian tube   . Elevated LFTs   . Esophageal reflux   . Esophageal stricture 2002  . Hemorrhoids   . History of rectal polyps   . HLD (hyperlipidemia)   . HTN (hypertension)   . Hypertonicity of bladder   . Irritable bowel syndrome with constipation   . Obesity   . Optic neuritis   . Osteoporosis   . Peptic ulcer disease   . Peripheral neuropathy   . Trigeminal neuralgia    prior injury  . 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 Vitamin D deficiency    Past Surgical History:  Procedure Laterality Date  . ABDOMINAL HYSTERECTOMY    . ANAL RECTAL MANOMETRY N/A 07/05/2014   Procedure: ANO RECTAL MANOMETRY;  Surgeon: Leighton Ruff, MD;  Location: [facility];  Service: Endoscopy;  Laterality: N/A;  . APPENDECTOMY    . CAROTID ENDARTERECTOMY    . COLONOSCOPY  2008, 2015  . TONSILLECTOMY    . UPPER GASTROINTESTINAL ENDOSCOPY  2002    Allergies  Allergen Reactions  . Azithromycin   . Biaxin [Clarithromycin]   . Clarithromycin   .  Doxycycline   . Fosamax [Alendronate Sodium]   . Geodon [Ziprasidone Hydrochloride]   . Lipitor [Atorvastatin Calcium]   . Penicillins   . Pravachol [Pravastatin Sodium]   . Statins   . Sulfonamide Derivatives   . Zocor [Simvastatin]     Outpatient Encounter Medications as of 06/19/2018  Medication Sig  . acetaminophen (TYLENOL) 325 MG tablet Take 650 mg by mouth every 6 (six) hours as needed.   Marland Kitchen acetaminophen (TYLENOL) 650 MG CR tablet Take 650 mg by mouth 2 (two) times daily.   Marland Kitchen alum & mag hydroxide-simeth (MYLANTA) 200-200-20 MG/5ML suspension Take 30 mLs by mouth at bedtime.  Marland Kitchen alum &  mag hydroxide-simeth (MYLANTA) 200-200-20 MG/5ML suspension Take 30 mLs by mouth every 4 (four) hours as needed for indigestion or heartburn.  Marland Kitchen amitriptyline (ELAVIL) 50 MG tablet Take 50 mg by mouth at bedtime.  Marland Kitchen aspirin 81 MG EC tablet Take 81 mg by mouth daily.    . bisacodyl (DULCOLAX) 10 MG suppository Place 10 mg rectally daily as needed for moderate constipation.  . bisacodyl (DULCOLAX) 5 MG EC tablet Take 5 mg by mouth daily as needed for moderate constipation.  . calcium-vitamin D (OSCAL WITH D) 250-125 MG-UNIT tablet Take 1 tablet by mouth 2 (two) times daily. Reported on 12/27/2015  . carbamide peroxide (DEBROX) 6.5 % OTIC solution Place 5 drops to affected ear every night for 3 days, then irrigate with bulb syringe. If not effective, notify MD.  . chlordiazePOXIDE (LIBRIUM) 25 MG capsule Take 25 mg by mouth daily as needed (in addition to the scheduled dose at bedtime).   . cyclobenzaprine (FLEXERIL) 5 MG tablet Take 5 mg by mouth 3 (three) times daily as needed for muscle spasms.  . famotidine (PEPCID) 20 MG tablet Take 20 mg by mouth at bedtime.  . fluPHENAZine (PROLIXIN) 1 MG tablet Take 1 mg by mouth at bedtime.  . fluticasone (FLONASE) 50 MCG/ACT nasal spray Place 1 spray into both nostrils 2 (two) times daily.  . furosemide (LASIX) 20 MG tablet Take 20 mg by mouth daily.   Marland Kitchen HYDROcodone-acetaminophen (NORCO/VICODIN) 5-325 MG tablet Take 1 tablet by mouth every morning.  . hydrocortisone 2.5 % cream Apply 1 application topically 2 (two) times daily as needed.  . hydrocortisone cream 1 % Apply 1 application topically 2 (two) times daily.  Marland Kitchen linaclotide (LINZESS) 290 MCG CAPS capsule Take 290 mcg by mouth daily.  Marland Kitchen LORazepam (ATIVAN) 0.5 MG tablet Take one tablet by mouth twice daily for anxiety  . magnesium hydroxide (MILK OF MAGNESIA) 400 MG/5ML suspension Take 30 mLs by mouth daily as needed.   . mometasone (ELOCON) 0.1 % cream May self administer to face twice daily as needed.    . Multiple Vitamins-Minerals (CENTRUM SILVER PO) Take 1 tablet by mouth.    . polyethylene glycol (MIRALAX / GLYCOLAX) packet Take 17 g by mouth 2 (two) times daily.  . polyvinyl alcohol (LIQUIFILM TEARS) 1.4 % ophthalmic solution Place 1 drop into both eyes 3 (three) times daily.  Marland Kitchen saccharomyces boulardii (FLORASTOR) 250 MG capsule Take 250 mg by mouth daily.   Marland Kitchen spironolactone (ALDACTONE) 12.5 mg TABS tablet Take 12.5 mg by mouth daily.  . [DISCONTINUED] acyclovir ointment (ZOVIRAX) 5 % Apply 1 application topically 6 (six) times daily. As needed  . [DISCONTINUED] carbamide peroxide (DEBROX) 6.5 % otic solution Place 1-2 drops into both ears. Once a day on Wednesday  . [DISCONTINUED] denosumab (PROLIA) 60 MG/ML SOLN injection Inject 60 mg  every 6 (six) months into the skin. Administer in upper arm, thigh, or abdomen  . [DISCONTINUED] HYDROcodone-acetaminophen (NORCO/VICODIN) 5-325 MG tablet Take 1 tablet by mouth daily as needed for severe pain. In the evening  . [DISCONTINUED] ibuprofen (ADVIL,MOTRIN) 200 MG tablet Take 200 mg by mouth 3 (three) times daily as needed (pain).   No facility-administered encounter medications on file as of 06/19/2018.     Review of Systems  Constitutional: Negative for appetite change, chills, diaphoresis and fever.  HENT: Negative for congestion, ear pain, hearing loss, mouth sores, rhinorrhea, sinus pressure and trouble swallowing.   Eyes: Positive for visual disturbance.  Respiratory: Negative for cough, shortness of breath and wheezing.   Cardiovascular: Negative for chest pain and palpitations.  Gastrointestinal: Negative for abdominal pain, constipation, diarrhea, nausea and vomiting.       Had a bowel movement yesterday  Genitourinary: Negative for dysuria, flank pain and frequency.  Musculoskeletal: Positive for arthralgias and gait problem. Negative for back pain.  Skin: Negative for rash.  Neurological: Negative for dizziness, weakness and  headaches.  Psychiatric/Behavioral: Negative for behavioral problems and confusion.    Immunization History  Administered Date(s) Administered  . Influenza Split 07/23/2013  . Influenza Whole 07/05/2009  . Influenza-Unspecified 07/29/2014, 06/14/2015, 07/11/2016, 07/15/2017  . PPD Test 06/17/2013  . Pneumococcal Conjugate-13 07/22/2017  . Pneumococcal Polysaccharide-23 05/05/2009  . Td 05/05/2009  . Zoster 01/09/2011   Pertinent  Health Maintenance Due  Topic Date Due  . INFLUENZA VACCINE  07/24/2018 (Originally 04/24/2018)  . DEXA SCAN  Completed  . PNA vac Low Risk Adult  Completed   Fall Risk  06/10/2018 06/04/2017 07/29/2014  Falls in the past year? No Yes No  Number falls in past yr: - 1 -  Injury with Fall? - No -   Functional Status Survey:    Vitals:   06/19/18 1016  BP: 118/70  Pulse: 62  Temp: 97.7 F (36.5 C)  TempSrc: Oral  Weight: 137 lb 12.8 oz (62.5 kg)   Body mass index is 26.04 kg/m.   Wt Readings from Last 3 Encounters:  06/19/18 137 lb 12.8 oz (62.5 kg)  06/10/18 141 lb (64 kg)  03/07/18 141 lb 9.6 oz (64.2 kg)   Physical Exam  Constitutional: She is oriented to person, place, and time. She appears well-developed. No distress.  Overweight   HENT:  Head: Normocephalic and atraumatic.  Mouth/Throat: Oropharynx is clear and moist. No oropharyngeal exudate.  Upper and lower dentures  Eyes: Pupils are equal, round, and reactive to light. Conjunctivae and EOM are normal. Right eye exhibits no discharge. Left eye exhibits no discharge.  Legally blind  Neck: Normal range of motion. Neck supple.  Cardiovascular: Normal rate and regular rhythm.  Pulmonary/Chest: Effort normal and breath sounds normal. She has no wheezes. She has no rales.  Abdominal: Soft. Bowel sounds are normal. There is no tenderness. There is no guarding.  Musculoskeletal: She exhibits edema.  Unsteady gait, uses walker with seat, bluish discoloration of feet, limited knee ROM    Lymphadenopathy:    She has no cervical adenopathy.  Neurological: She is alert and oriented to person, place, and time. She exhibits normal muscle tone.  Skin: Skin is warm and dry. She is not diaphoretic.  Psychiatric: She has a normal mood and affect. Her behavior is normal.    Labs reviewed: Recent Labs    12/01/17 0926 01/07/18  NA 141 140  K 4.0 4.5  CL 105  --  CO2 27  --   GLUCOSE 95  --   BUN 19 16  CREATININE 1.18* 1.0  CALCIUM 9.5  --    Recent Labs    12/01/17 0926  AST 29  ALT 19  ALKPHOS 117  BILITOT 0.4  PROT 8.1  ALBUMIN 4.2   Recent Labs    12/01/17 0926  WBC 5.2  NEUTROABS 2.3  HGB 14.2  HCT 43.9  MCV 93.2  PLT 309   Lab Results  Component Value Date   TSH 2.55 05/28/2017   Lab Results  Component Value Date   HGBA1C 5.9 02/05/2017   Lab Results  Component Value Date   CHOL 253 (A) 01/07/2018   HDL 68 01/07/2018   LDLCALC 151 01/07/2018   LDLDIRECT 190.0 03/24/2012   TRIG 205 (A) 01/07/2018   CHOLHDL 4 03/24/2012    Significant Diagnostic Results in last 30 days:  No results found.  Assessment/Plan  1. Gastroesophageal reflux disease without esophagitis continue famotidine.   2. Constipation Continue miralax and linzess, maintain hydration  3. GAD (generalized anxiety disorder) Continue current regimen, d/c prn chlordizepoxide for non use. To follow with psych services  4. Primary osteoarthritis of both knees Continue norco scheduled and prn tylenol. D/c flexeril for non use.   5. Overweight (BMI 25.0-29.9) With last a1c of 5.9. Check a1c and TSH. Continue group exercises at ALF.    Family/ staff Communication: reviewed care plan with patient and charge nurse.    Labs/tests ordered:  Cbc, CMP, TSH, A1C   Blanchie Serve, MD Internal Medicine [facility] [address] [place], [place] [address] Cell Phone (Monday-Friday 8 am - 5 pm): [phone] On Call: [phone] and  follow prompts after 5 pm and on weekends Office Phone: [phone] Office Fax: [phone]

## 2018-06-24 LAB — HEPATIC FUNCTION PANEL
ALK PHOS: 121 (ref 25–125)
ALT: 19 (ref 7–35)
AST: 23 (ref 13–35)
Bilirubin, Total: 0.3

## 2018-06-24 LAB — BASIC METABOLIC PANEL
BUN: 15 (ref 4–21)
Creatinine: 1.2 — AB (ref <=1.1)
GLUCOSE: 142
Potassium: 4.3 (ref 3.4–5.3)
Sodium: 137 (ref 137–147)

## 2018-06-24 LAB — CBC AND DIFFERENTIAL
HEMATOCRIT: 38 (ref 36–46)
Hemoglobin: 12.9 (ref 12.0–16.0)
PLATELETS: 401 — AB (ref 150–399)
WBC: 6

## 2018-06-25 ENCOUNTER — Other Ambulatory Visit: Payer: Self-pay | Admitting: *Deleted

## 2018-09-23 ENCOUNTER — Encounter: Payer: Self-pay | Admitting: Nurse Practitioner

## 2018-09-23 ENCOUNTER — Non-Acute Institutional Stay: Payer: Medicare Other | Admitting: Nurse Practitioner

## 2018-09-23 DIAGNOSIS — G8929 Other chronic pain: Secondary | ICD-10-CM

## 2018-09-23 DIAGNOSIS — F411 Generalized anxiety disorder: Secondary | ICD-10-CM

## 2018-09-23 DIAGNOSIS — K219 Gastro-esophageal reflux disease without esophagitis: Secondary | ICD-10-CM

## 2018-09-23 DIAGNOSIS — M25561 Pain in right knee: Secondary | ICD-10-CM

## 2018-09-23 DIAGNOSIS — M25562 Pain in left knee: Secondary | ICD-10-CM

## 2018-09-23 DIAGNOSIS — I10 Essential (primary) hypertension: Secondary | ICD-10-CM | POA: Diagnosis not present

## 2018-09-23 DIAGNOSIS — K5909 Other constipation: Secondary | ICD-10-CM | POA: Diagnosis not present

## 2018-09-23 NOTE — Progress Notes (Signed)
Location:  [facility] Room Number: [phone] Place of Service:  ALF [phone]) Provider:  Lennie Odor Gevorg Brum NP  Blanchie Serve, MD (Inactive)  Patient Care Team: Blanchie Serve, MD (Inactive) as PCP - General (Internal Medicine) [name], MD (Ophthalmology) [name], MD (Psychiatry) Kayden Hutmacher X, NP as Nurse Practitioner (Nurse Practitioner) [name], MD as Consulting Physician (Orthopedic Surgery)  Extended Emergency Contact Information Primary Emergency Contact: [name], [place] Work Phone: [phone] Relation: Son Secondary Emergency Contact: [name] [place] of [place] Phone: [phone] Relation: Sister  Code Status:  DNR Goals of care: Advanced Directive information Advanced Directives 06/19/2018  Does Patient Have a Medical Advance Directive? Yes  Type of Paramedic of [place];Living will;Out of facility DNR (pink MOST or yellow form)  Does patient want to make changes to medical advance directive? -  Copy of [place] in Chart? Yes  Pre-existing out of facility DNR order (yellow form or pink MOST form) Yellow form placed in chart (order not valid for inpatient use)     Chief Complaint  Patient presents with  . Medical Management of Chronic Issues    HPI:  Pt is a 77 y.o. female seen today for medical management of chronic diseases.    The patient resides in [place] for safety and care assistance. Her leg edema is managed with Spironolactone 12.53mchronic g qd, Furosemide 20mg  qd. Constipation is managed with DulcoLax 5mg  qd prn, 10mg  suppository daily prn, MiraLax bid, Linzess 224mcg qd, MOM 30mg  daily prn. Her mood is stable on Prolixin 1mg  qd, Librium 25mg  daily prn, Amitriptyline 50mg  qd,   Lorazepam 0.5mg  bid. OA pain is controlled on Norco 5/325mg  qd, Tylenol 650mg  bid.  GERD stable on Famotidine 20mg  qd.  Past Medical History:    Diagnosis Date  . Allergy   . Anemia   . Anxiety   . Asthma   . CAD (coronary artery disease)   . Depression   . Diverticulosis   . Dysfunction of eustachian tube   . Elevated LFTs   . Esophageal reflux   . Esophageal stricture 2002  . Hemorrhoids   . History of rectal polyps   . HLD (hyperlipidemia)   . HTN (hypertension)   . Hypertonicity of bladder   . Irritable bowel syndrome with constipation   . Obesity   . Optic neuritis   . Osteoporosis   . Peptic ulcer disease   . Peripheral neuropathy   . Trigeminal neuralgia    prior injury  . 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 Vitamin D deficiency    Past Surgical History:  Procedure Laterality Date  . ABDOMINAL HYSTERECTOMY    . ANAL RECTAL MANOMETRY N/A 07/05/2014   Procedure: ANO RECTAL MANOMETRY;  Surgeon: Leighton Ruff, MD;  Location: [facility];  Service: Endoscopy;  Laterality: N/A;  . APPENDECTOMY    . CAROTID ENDARTERECTOMY    . COLONOSCOPY  2008, 2015  . TONSILLECTOMY    . UPPER GASTROINTESTINAL ENDOSCOPY  2002    Allergies  Allergen Reactions  . Azithromycin   . Biaxin [Clarithromycin]   . Clarithromycin   . Doxycycline   . Fosamax [Alendronate Sodium]   . Geodon [Ziprasidone Hydrochloride]   . Lipitor [Atorvastatin Calcium]   . Penicillins   . Pravachol [Pravastatin Sodium]   . Statins   .  Sulfonamide Derivatives   . Zocor [Simvastatin]     Outpatient Encounter Medications as of 09/23/2018  Medication Sig  . acetaminophen (TYLENOL) 325 MG tablet Take 650 mg by mouth every 6 (six) hours as needed.   Marland Kitchen acetaminophen (TYLENOL) 650 MG CR tablet Take 650 mg by mouth 2 (two) times daily.   Marland Kitchen alum & mag hydroxide-simeth (MYLANTA) 200-200-20 MG/5ML suspension Take 30 mLs by mouth at bedtime.  Marland Kitchen alum & mag hydroxide-simeth (MYLANTA) 200-200-20 MG/5ML suspension Take 30 mLs by mouth every 4 (four) hours as needed for indigestion or  heartburn.  Marland Kitchen amitriptyline (ELAVIL) 50 MG tablet Take 50 mg by mouth at bedtime.  Marland Kitchen aspirin 81 MG EC tablet Take 81 mg by mouth daily.    . bisacodyl (DULCOLAX) 10 MG suppository Place 10 mg rectally daily as needed for moderate constipation.  . bisacodyl (DULCOLAX) 5 MG EC tablet Take 5 mg by mouth daily as needed for moderate constipation.  . calcium-vitamin D (OSCAL WITH D) 250-125 MG-UNIT tablet Take 1 tablet by mouth 2 (two) times daily. Reported on 12/27/2015  . carbamide peroxide (DEBROX) 6.5 % OTIC solution Place 5 drops to affected ear every night for 3 days, then irrigate with bulb syringe. If not effective, notify MD.  . chlordiazePOXIDE (LIBRIUM) 25 MG capsule Take 25 mg by mouth daily as needed (in addition to the scheduled dose at bedtime).   . cyclobenzaprine (FLEXERIL) 5 MG tablet Take 5 mg by mouth 3 (three) times daily as needed for muscle spasms.  . famotidine (PEPCID) 20 MG tablet Take 20 mg by mouth at bedtime.  . fluPHENAZine (PROLIXIN) 1 MG tablet Take 1 mg by mouth at bedtime.  . fluticasone (FLONASE) 50 MCG/ACT nasal spray Place 1 spray into both nostrils 2 (two) times daily.  . furosemide (LASIX) 20 MG tablet Take 20 mg by mouth daily.   Marland Kitchen HYDROcodone-acetaminophen (NORCO/VICODIN) 5-325 MG tablet Take 1 tablet by mouth every morning.  . hydrocortisone 2.5 % cream Apply 1 application topically 2 (two) times daily as needed.  . hydrocortisone cream 1 % Apply 1 application topically 2 (two) times daily.  Marland Kitchen linaclotide (LINZESS) 290 MCG CAPS capsule Take 290 mcg by mouth daily.  Marland Kitchen LORazepam (ATIVAN) 0.5 MG tablet Take one tablet by mouth twice daily for anxiety  . magnesium hydroxide (MILK OF MAGNESIA) 400 MG/5ML suspension Take 30 mLs by mouth daily as needed.   . mometasone (ELOCON) 0.1 % cream May self administer to face twice daily as needed.  . Multiple Vitamins-Minerals (CENTRUM SILVER PO) Take 1 tablet by mouth.    . polyethylene glycol (MIRALAX / GLYCOLAX) packet  Take 17 g by mouth 2 (two) times daily.  . polyvinyl alcohol (LIQUIFILM TEARS) 1.4 % ophthalmic solution Place 1 drop into both eyes 3 (three) times daily.  Marland Kitchen saccharomyces boulardii (FLORASTOR) 250 MG capsule Take 250 mg by mouth daily.   Marland Kitchen spironolactone (ALDACTONE) 12.5 mg TABS tablet Take 12.5 mg by mouth daily.   No facility-administered encounter medications on file as of 09/23/2018.     Review of Systems  Constitutional: Negative for activity change, appetite change, chills, diaphoresis, fatigue, fever and unexpected weight change.  HENT: Positive for hearing loss. Negative for congestion, sore throat and voice change.   Eyes: Positive for visual disturbance.       Low vision  Respiratory: Negative for cough, shortness of breath and wheezing.   Cardiovascular: Positive for leg swelling. Negative for chest pain and palpitations.  Gastrointestinal:  Negative for abdominal distention, abdominal pain, constipation, diarrhea, nausea and vomiting.  Genitourinary: Negative for difficulty urinating, dysuria and urgency.  Musculoskeletal: Positive for arthralgias and gait problem.  Skin: Negative for color change and pallor.  Neurological: Negative for dizziness, speech difficulty, weakness and headaches.       Memory lapses.   Psychiatric/Behavioral: Negative for agitation, behavioral problems, hallucinations and sleep disturbance. The patient is not nervous/anxious.     Immunization History  Administered Date(s) Administered  . Influenza Split 07/23/2013  . Influenza Whole 07/05/2009, 06/26/2018  . Influenza-Unspecified 07/29/2014, 06/14/2015, 07/11/2016, 07/15/2017  . PPD Test 06/17/2013  . Pneumococcal Conjugate-13 07/22/2017  . Pneumococcal Polysaccharide-23 05/05/2009  . Td 05/05/2009  . Zoster 01/09/2011   Pertinent  Health Maintenance Due  Topic Date Due  . INFLUENZA VACCINE  Completed  . DEXA SCAN  Completed  . PNA vac Low Risk Adult  Completed   Fall Risk  06/10/2018  06/04/2017 07/29/2014  Falls in the past year? No Yes No  Number falls in past yr: - 1 -  Injury with Fall? - No -   Functional Status Survey:    Vitals:   09/23/18 1444  BP: 132/60  Pulse: 60  Resp: 18  Temp: (!) 97.1 F (36.2 C)   There is no height or weight on file to calculate BMI. Physical Exam Constitutional:      General: She is not in acute distress.    Appearance: Normal appearance. She is normal weight. She is not ill-appearing, toxic-appearing or diaphoretic.  HENT:     Head: Normocephalic and atraumatic.     Nose: Nose normal. No congestion or rhinorrhea.     Mouth/Throat:     Mouth: Mucous membranes are moist.     Comments: Dentures.  Eyes:     Extraocular Movements: Extraocular movements intact.     Pupils: Pupils are equal, round, and reactive to light.     Comments: Legally blind.   Neck:     Musculoskeletal: Normal range of motion and neck supple. No muscular tenderness.  Cardiovascular:     Rate and Rhythm: Normal rate and regular rhythm.     Heart sounds: No murmur.  Pulmonary:     Effort: Pulmonary effort is normal.     Breath sounds: Normal breath sounds. No wheezing, rhonchi or rales.  Abdominal:     General: There is no distension.     Tenderness: There is no abdominal tenderness. There is no guarding or rebound.  Musculoskeletal:     Right lower leg: Edema present.     Left lower leg: Edema present.     Comments: Trace edema BLE  Skin:    General: Skin is warm and dry.     Coloration: Skin is not pale.     Findings: No erythema or rash.     Comments: Bluish discoloration BLE  Neurological:     General: No focal deficit present.     Mental Status: She is alert.     Motor: No weakness.     Gait: Gait abnormal.     Comments: Oriented to person and place.   Psychiatric:        Mood and Affect: Mood normal.        Behavior: Behavior normal.     Labs reviewed: Recent Labs    12/01/17 0926 01/07/18 06/24/18  NA 141 140 137  K 4.0 4.5  4.3  CL 105  --   --   CO2 27  --   --  GLUCOSE 95  --   --   BUN 19 16 15   CREATININE 1.18* 1.0 1.2*  CALCIUM 9.5  --   --    Recent Labs    12/01/17 0926 06/24/18  AST 29 23  ALT 19 19  ALKPHOS 117 121  BILITOT 0.4  --   PROT 8.1  --   ALBUMIN 4.2  --    Recent Labs    12/01/17 0926 06/24/18  WBC 5.2 6.0  NEUTROABS 2.3  --   HGB 14.2 12.9  HCT 43.9 38  MCV 93.2  --   PLT 309 401*   Lab Results  Component Value Date   TSH 2.55 05/28/2017   Lab Results  Component Value Date   HGBA1C 5.9 02/05/2017   Lab Results  Component Value Date   CHOL 253 (A) 01/07/2018   HDL 68 01/07/2018   LDLCALC 151 01/07/2018   LDLDIRECT 190.0 03/24/2012   TRIG 205 (A) 01/07/2018   CHOLHDL 4 03/24/2012    Significant Diagnostic Results in last 30 days:  No results found.  Assessment/Plan Essential hypertension Controlled, no change of current treatment.   Constipation Constipation is managed, continue DulcoLax 5mg  qd prn, 10mg  suppository daily prn, MiraLax bid, Linzess 213mcg qd, MOM 30mg  daily prn.    GAD (generalized anxiety disorder) Her mood is stable, continue Prolixin 1mg  qd, Librium 25mg  daily prn, Amitriptyline 50mg  qd, Lorazepam 0.5mg  bid.    Chronic pain of both knees OA pain is controlled, continue Norco 5/325mg  qd, Tylenol 650mg  bid.      GERD (gastroesophageal reflux disease) .GERD stable, continue Famotidine 20mg  qd.      PVD (peripheral vascular disease) (HCC) Her leg edema is managed, continue  Spironolactone 12.54mchronic g qd, Furosemide 20mg  qd     Family/ staff Communication: plan of care reviewed with the patient and charge nurse.   Labs/tests ordered:  none  Time spend 25 minutes.

## 2018-09-23 NOTE — Assessment & Plan Note (Signed)
Her leg edema is managed, continue  Spironolactone 12.1mchronic g qd, Furosemide 20mg  qd

## 2018-09-23 NOTE — Assessment & Plan Note (Signed)
OA pain is controlled, continue Norco 5/325mg  qd, Tylenol 650mg  bid.

## 2018-09-23 NOTE — Assessment & Plan Note (Signed)
Constipation is managed, continue DulcoLax 5mg  qd prn, 10mg  suppository daily prn, MiraLax bid, Linzess 271mcg qd, MOM 30mg  daily prn.

## 2018-09-23 NOTE — Assessment & Plan Note (Signed)
Her mood is stable, continue Prolixin 1mg  qd, Librium 25mg  daily prn, Amitriptyline 50mg  qd, Lorazepam 0.5mg  bid.

## 2018-09-23 NOTE — Assessment & Plan Note (Signed)
.  GERD stable, continue Famotidine 20mg  qd.

## 2018-09-23 NOTE — Assessment & Plan Note (Signed)
Controlled, no change of current treatment.

## 2018-09-25 ENCOUNTER — Encounter: Payer: Self-pay | Admitting: Internal Medicine

## 2018-10-08 ENCOUNTER — Other Ambulatory Visit: Payer: Self-pay | Admitting: *Deleted

## 2018-10-08 DIAGNOSIS — M25562 Pain in left knee: Principal | ICD-10-CM

## 2018-10-08 DIAGNOSIS — G8929 Other chronic pain: Secondary | ICD-10-CM

## 2018-10-08 DIAGNOSIS — M25561 Pain in right knee: Principal | ICD-10-CM

## 2018-10-08 MED ORDER — HYDROCODONE-ACETAMINOPHEN 5-325 MG PO TABS: 1.0000 | ORAL_TABLET | ORAL | 0 refills | Status: DC

## 2018-10-13 ENCOUNTER — Other Ambulatory Visit: Payer: Self-pay | Admitting: *Deleted

## 2018-10-13 DIAGNOSIS — F411 Generalized anxiety disorder: Secondary | ICD-10-CM

## 2018-10-13 MED ORDER — LORAZEPAM 0.5 MG PO TABS: ORAL_TABLET | ORAL | 0 refills | Status: DC

## 2018-11-07 ENCOUNTER — Other Ambulatory Visit: Payer: Self-pay | Admitting: *Deleted

## 2018-11-07 DIAGNOSIS — G8929 Other chronic pain: Secondary | ICD-10-CM

## 2018-11-07 DIAGNOSIS — M25561 Pain in right knee: Principal | ICD-10-CM

## 2018-11-07 DIAGNOSIS — M25562 Pain in left knee: Principal | ICD-10-CM

## 2018-11-07 MED ORDER — HYDROCODONE-ACETAMINOPHEN 5-325 MG PO TABS: 1.0000 | ORAL_TABLET | ORAL | 0 refills | Status: DC

## 2018-11-18 ENCOUNTER — Encounter: Payer: Self-pay | Admitting: Internal Medicine

## 2018-11-18 ENCOUNTER — Non-Acute Institutional Stay: Payer: Medicare Other | Admitting: Internal Medicine

## 2018-11-18 DIAGNOSIS — I10 Essential (primary) hypertension: Secondary | ICD-10-CM | POA: Diagnosis not present

## 2018-11-18 DIAGNOSIS — F411 Generalized anxiety disorder: Secondary | ICD-10-CM

## 2018-11-18 DIAGNOSIS — K219 Gastro-esophageal reflux disease without esophagitis: Secondary | ICD-10-CM | POA: Diagnosis not present

## 2018-11-18 DIAGNOSIS — K5909 Other constipation: Secondary | ICD-10-CM | POA: Diagnosis not present

## 2018-11-18 NOTE — Progress Notes (Signed)
Location:  [facility] Room Number: [place] of Service:  ALF [phone]) Provider:    [name], MD  Patient Care Team: [name], MD as PCP - General (Internal Medicine) [name], MD (Ophthalmology) [name], MD (Psychiatry) Mast, Man X, NP as Nurse Practitioner (Nurse Practitioner) [name], MD as Consulting Physician (Orthopedic Surgery)  Extended Emergency Contact Information Primary Emergency Contact: [name], [place] of [place] Work Phone: [phone] Relation: Son Secondary Emergency Contact: [name] [place] of [place] Phone: [phone] Relation: Sister  Code Status:  DNR  Goals of care: Advanced Directive information Advanced Directives 11/18/2018  Does Patient Have a Medical Advance Directive? Yes  Type of Paramedic of [place];Out of facility DNR (pink MOST or yellow form)  Does patient want to make changes to medical advance directive? No - Patient declined  Copy of [place] in Chart? Yes - validated most recent copy scanned in chart (See row information)  Pre-existing out of facility DNR order (yellow form or pink MOST form) Yellow form placed in chart (order not valid for inpatient use)     Chief Complaint  Patient presents with  . Medical Management of Chronic Issues    Routine visit     HPI:  Pt is a 78 y.o. female seen today for medical management of chronic diseases.    [place] of [place]. She has h/o Hypertension, Osteoarthritis, GERD , Constipation , Bilateral Visual Loss and  Anxiety with Depression  Patient was seen in her room. Had No Acute complains. D/W the Nurse and she said patient has been stable Walks with the walker. No Falls. Appetite is good. She is followed By Psych for her Mood Her weight is stable   Past Medical History:  Diagnosis Date  . Allergy   . Anemia   . Anxiety   .  Asthma   . CAD (coronary artery disease)   . Depression   . Diverticulosis   . Dysfunction of eustachian tube   . Elevated LFTs   . Esophageal reflux   . Esophageal stricture 2002  . Hemorrhoids   . History of rectal polyps   . HLD (hyperlipidemia)   . HTN (hypertension)   . Hypertonicity of bladder   . Irritable bowel syndrome with constipation   . Obesity   . Optic neuritis   . Osteoporosis   . Peptic ulcer disease   . Peripheral neuropathy   . Trigeminal neuralgia    prior injury  . 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 Vitamin D deficiency    Past Surgical History:  Procedure Laterality Date  . ABDOMINAL HYSTERECTOMY    . ANAL RECTAL MANOMETRY N/A 07/05/2014   Procedure: ANO RECTAL MANOMETRY;  Surgeon: Leighton Ruff, MD;  Location: [facility];  Service: Endoscopy;  Laterality: N/A;  . APPENDECTOMY    . CAROTID ENDARTERECTOMY    . COLONOSCOPY  2008, 2015  . TONSILLECTOMY    . UPPER GASTROINTESTINAL ENDOSCOPY  2002    Allergies  Allergen Reactions  . Azithromycin   . Biaxin [Clarithromycin]   . Clarithromycin   . Doxycycline   . Fosamax [Alendronate Sodium]   . Geodon [Ziprasidone Hydrochloride]   . Lipitor [Atorvastatin Calcium]   . Penicillins   . Pravachol [Pravastatin Sodium]   . Statins   .  Sulfonamide Derivatives   . Zocor [Simvastatin]     Outpatient Encounter Medications as of 11/18/2018  Medication Sig  . acetaminophen (TYLENOL) 325 MG tablet Take 650 mg by mouth every 6 (six) hours as needed.   Marland Kitchen acetaminophen (TYLENOL) 650 MG CR tablet Take 650 mg by mouth 2 (two) times daily.   Marland Kitchen alum & mag hydroxide-simeth (MYLANTA) 200-200-20 MG/5ML suspension Take 30 mLs by mouth at bedtime.  Marland Kitchen alum & mag hydroxide-simeth (MYLANTA) 200-200-20 MG/5ML suspension Take 30 mLs by mouth every 4 (four) hours as needed for indigestion or heartburn.  Marland Kitchen amitriptyline (ELAVIL) 50 MG tablet Take 50 mg by  mouth at bedtime.  Marland Kitchen aspirin 81 MG EC tablet Take 81 mg by mouth daily.    . betamethasone valerate (VALISONE) 0.1 % cream Apply 1 application topically 2 (two) times daily. Apply  thin amount to vulva twice daily  . bisacodyl (DULCOLAX) 10 MG suppository Place 10 mg rectally daily as needed for moderate constipation.  . bisacodyl (DULCOLAX) 5 MG EC tablet Take 5 mg by mouth daily as needed for moderate constipation.  . calcium-vitamin D (OSCAL WITH D) 250-125 MG-UNIT tablet Take 1 tablet by mouth 2 (two) times daily. Reported on 12/27/2015  . carbamide peroxide (DEBROX) 6.5 % OTIC solution Place 5 drops to affected ear every night for 3 days, then irrigate with bulb syringe. If not effective, notify MD.  . chlordiazePOXIDE (LIBRIUM) 25 MG capsule Take 25 mg by mouth daily as needed (in addition to the scheduled dose at bedtime).   . famotidine (PEPCID) 20 MG tablet Take 20 mg by mouth at bedtime.  . fluPHENAZine (PROLIXIN) 1 MG tablet Take 1 mg by mouth at bedtime.  . fluticasone (FLONASE) 50 MCG/ACT nasal spray Place 1 spray into both nostrils 2 (two) times daily.  . furosemide (LASIX) 20 MG tablet Take 20 mg by mouth daily.   Marland Kitchen HYDROcodone-acetaminophen (NORCO/VICODIN) 5-325 MG tablet Take 1 tablet by mouth every morning.  . hydrocortisone 2.5 % cream Apply 1 application topically 2 (two) times daily as needed.  . hydrocortisone cream 1 % Apply 1 application topically 2 (two) times daily.  Marland Kitchen linaclotide (LINZESS) 290 MCG CAPS capsule Take 290 mcg by mouth daily.  Marland Kitchen loratadine (CLARITIN) 10 MG tablet Take 10 mg by mouth daily as needed for allergies.  Marland Kitchen LORazepam (ATIVAN) 0.5 MG tablet Take one tablet by mouth twice daily for anxiety  . magnesium hydroxide (MILK OF MAGNESIA) 400 MG/5ML suspension Take 30 mLs by mouth daily as needed.   . mometasone (ELOCON) 0.1 % cream May self administer to face twice daily as needed.  . Multiple Vitamins-Minerals (CENTRUM SILVER PO) Take 1 tablet by mouth.      . polyethylene glycol (MIRALAX / GLYCOLAX) packet Take 17 g by mouth 2 (two) times daily.  . polyvinyl alcohol (LIQUIFILM TEARS) 1.4 % ophthalmic solution Place 1 drop into both eyes 3 (three) times daily.  Marland Kitchen saccharomyces boulardii (FLORASTOR) 250 MG capsule Take 250 mg by mouth daily.   Marland Kitchen spironolactone (ALDACTONE) 12.5 mg TABS tablet Take 12.5 mg by mouth daily.  . [DISCONTINUED] cyclobenzaprine (FLEXERIL) 5 MG tablet Take 5 mg by mouth 3 (three) times daily as needed for muscle spasms.   No facility-administered encounter medications on file as of 11/18/2018.     Review of Systems  Review of Systems  Constitutional: Negative for activity change, appetite change, chills, diaphoresis, fatigue and fever.  HENT: Negative for mouth sores, postnasal drip, rhinorrhea, sinus  pain and sore throat.   Respiratory: Negative for apnea, cough, chest tightness, shortness of breath and wheezing.   Cardiovascular: Negative for chest pain, palpitations and leg swelling.  Gastrointestinal: Negative for abdominal distention, abdominal pain, constipation, diarrhea, nausea and vomiting.  Genitourinary: Negative for dysuria and frequency.  Musculoskeletal: Negative for arthralgias, joint swelling and myalgias.  Skin: Negative for rash.  Neurological: Negative for dizziness, syncope, weakness, light-headedness and numbness.  Psychiatric/Behavioral: Negative for behavioral problems, confusion and sleep disturbance.     Immunization History  Administered Date(s) Administered  . Influenza Split 07/23/2013  . Influenza Whole 07/05/2009, 06/26/2018  . Influenza-Unspecified 07/29/2014, 06/14/2015, 07/11/2016, 07/15/2017  . PPD Test 06/17/2013  . Pneumococcal Conjugate-13 07/22/2017  . Pneumococcal Polysaccharide-23 05/05/2009  . Td 05/05/2009  . Zoster 01/09/2011   Pertinent  Health Maintenance Due  Topic Date Due  . INFLUENZA VACCINE  Completed  . DEXA SCAN  Completed  . PNA vac Low Risk Adult   Completed   Fall Risk  06/10/2018 06/04/2017 07/29/2014  Falls in the past year? No Yes No  Number falls in past yr: - 1 -  Injury with Fall? - No -   Functional Status Survey:    Vitals:   11/18/18 0837  BP: 120/74  Pulse: 60  Resp: 18  Temp: 97.6 F (36.4 C)  TempSrc: Oral  Weight: 139 lb 3.2 oz (63.1 kg)  Height: 5' 1.5" (1.562 m)   Body mass index is 25.88 kg/m. Physical Exam Vitals signs reviewed.  HENT:     Head: Normocephalic.     Nose: Nose normal.     Mouth/Throat:     Mouth: Mucous membranes are moist.     Pharynx: Oropharynx is clear.  Eyes:     Pupils: Pupils are equal, round, and reactive to light.  Neck:     Musculoskeletal: Neck supple.  Cardiovascular:     Rate and Rhythm: Normal rate and regular rhythm.  Pulmonary:     Effort: Pulmonary effort is normal.     Breath sounds: Normal breath sounds.  Abdominal:     General: Abdomen is flat. Bowel sounds are normal.     Palpations: Abdomen is soft.  Musculoskeletal:        General: No swelling.  Skin:    General: Skin is warm and dry.  Neurological:     General: No focal deficit present.     Mental Status: She is alert and oriented to person, place, and time.  Psychiatric:        Mood and Affect: Mood normal.        Thought Content: Thought content normal.        Judgment: Judgment normal.     Labs reviewed: Recent Labs    12/01/17 0926 01/07/18 06/24/18  NA 141 140 137  K 4.0 4.5 4.3  CL 105  --   --   CO2 27  --   --   GLUCOSE 95  --   --   BUN 19 16 15   CREATININE 1.18* 1.0 1.2*  CALCIUM 9.5  --   --    Recent Labs    12/01/17 0926 06/24/18  AST 29 23  ALT 19 19  ALKPHOS 117 121  BILITOT 0.4  --   PROT 8.1  --   ALBUMIN 4.2  --    Recent Labs    12/01/17 0926 06/24/18  WBC 5.2 6.0  NEUTROABS 2.3  --   HGB 14.2 12.9  HCT 43.9 38  MCV 93.2  --   PLT 309 401*   Lab Results  Component Value Date   TSH 2.55 05/28/2017   Lab Results  Component Value Date   HGBA1C 5.9  02/05/2017   Lab Results  Component Value Date   CHOL 253 (A) 01/07/2018   HDL 68 01/07/2018   LDLCALC 151 01/07/2018   LDLDIRECT 190.0 03/24/2012   TRIG 205 (A) 01/07/2018   CHOLHDL 4 03/24/2012    Significant Diagnostic Results in last 30 days:  No results found.  Assessment/Plan Essential hypertension BP stable. On Lasix and Aldactone Also for her LE edema . which is stable  GAD (generalized anxiety disorder) Sees Her Psychiatrist and is on Number of Meds including Prolixin and Ativan  GERD  On Famotidine  Constipation On Number of Meds Symptoms controlled Hyperlipidemia LDL last checked was 151 Will repeat labs. Not on Any meds ? Osteoporosis Listed in her Problem List  No Recent DEXA scan Will d/w family if we can repeat DEXA scan     Family/ staff Communication:   Labs/tests ordered:  BMP,CBC, Lipid, TSH  Total time spent in this patient care encounter was 40_ minutes; greater than 50% of the visit spent counseling patient, reviewing records , Labs and coordinating care for problems addressed at this encounter.

## 2018-11-20 NOTE — Addendum Note (Signed)
Addended by: Georgina Snell on: 11/20/2018 09:40 PM   Modules accepted: Level of Service

## 2018-11-21 LAB — TSH: TSH: 2.52 (ref 0.41–5.90)

## 2018-11-21 LAB — BASIC METABOLIC PANEL
BUN: 23 — AB (ref 4–21)
Creatinine: 1.1 (ref 0.5–1.1)
Glucose: 77
Potassium: 4.3 (ref 3.4–5.3)
Sodium: 141 (ref 137–147)

## 2018-11-21 LAB — LIPID PANEL
Cholesterol: 247 — AB (ref 0–200)
HDL: 72 — AB (ref 35–70)
LDL Cholesterol: 147
Triglycerides: 150 (ref 40–160)

## 2018-11-21 LAB — CBC AND DIFFERENTIAL
HCT: 39 (ref 36–46)
Hemoglobin: 13.4 (ref 12.0–16.0)
Platelets: 329 (ref 150–399)
WBC: 6.5

## 2018-11-21 LAB — HEPATIC FUNCTION PANEL
ALT: 21 (ref 7–35)
AST: 26 (ref 13–35)
Alkaline Phosphatase: 103 (ref 25–125)
Bilirubin, Total: 0.3

## 2018-12-05 ENCOUNTER — Other Ambulatory Visit: Payer: Self-pay

## 2018-12-05 DIAGNOSIS — G8929 Other chronic pain: Secondary | ICD-10-CM

## 2018-12-05 DIAGNOSIS — M25562 Pain in left knee: Principal | ICD-10-CM

## 2018-12-05 DIAGNOSIS — M25561 Pain in right knee: Principal | ICD-10-CM

## 2018-12-05 MED ORDER — HYDROCODONE-ACETAMINOPHEN 5-325 MG PO TABS: 1.0000 | ORAL_TABLET | ORAL | 0 refills | Status: DC

## 2018-12-05 NOTE — Telephone Encounter (Signed)
Refill request database checked no information found

## 2018-12-08 ENCOUNTER — Other Ambulatory Visit: Payer: Self-pay | Admitting: *Deleted

## 2018-12-08 DIAGNOSIS — M25561 Pain in right knee: Principal | ICD-10-CM

## 2018-12-08 DIAGNOSIS — M25562 Pain in left knee: Principal | ICD-10-CM

## 2018-12-08 DIAGNOSIS — G8929 Other chronic pain: Secondary | ICD-10-CM

## 2018-12-08 MED ORDER — HYDROCODONE-ACETAMINOPHEN 5-325 MG PO TABS: 1.0000 | ORAL_TABLET | ORAL | 0 refills | Status: DC

## 2018-12-12 ENCOUNTER — Other Ambulatory Visit: Payer: Self-pay

## 2018-12-12 DIAGNOSIS — M25562 Pain in left knee: Principal | ICD-10-CM

## 2018-12-12 DIAGNOSIS — G8929 Other chronic pain: Secondary | ICD-10-CM

## 2018-12-12 DIAGNOSIS — M25561 Pain in right knee: Principal | ICD-10-CM

## 2018-12-12 MED ORDER — HYDROCODONE-ACETAMINOPHEN 5-325 MG PO TABS: 1.0000 | ORAL_TABLET | ORAL | 0 refills | Status: DC

## 2018-12-15 ENCOUNTER — Other Ambulatory Visit: Payer: Self-pay | Admitting: *Deleted

## 2018-12-15 DIAGNOSIS — M25561 Pain in right knee: Secondary | ICD-10-CM

## 2018-12-15 DIAGNOSIS — M25562 Pain in left knee: Secondary | ICD-10-CM

## 2018-12-15 DIAGNOSIS — G8929 Other chronic pain: Secondary | ICD-10-CM

## 2018-12-15 DIAGNOSIS — F411 Generalized anxiety disorder: Secondary | ICD-10-CM

## 2018-12-15 MED ORDER — HYDROCODONE-ACETAMINOPHEN 5-325 MG PO TABS: 1.0000 | ORAL_TABLET | ORAL | 0 refills | Status: DC

## 2018-12-15 MED ORDER — LORAZEPAM 0.5 MG PO TABS: ORAL_TABLET | ORAL | 0 refills | Status: DC

## 2018-12-16 ENCOUNTER — Other Ambulatory Visit: Payer: Self-pay | Admitting: *Deleted

## 2018-12-16 DIAGNOSIS — M25562 Pain in left knee: Principal | ICD-10-CM

## 2018-12-16 DIAGNOSIS — G8929 Other chronic pain: Secondary | ICD-10-CM

## 2018-12-16 DIAGNOSIS — M25561 Pain in right knee: Principal | ICD-10-CM

## 2018-12-16 MED ORDER — HYDROCODONE-ACETAMINOPHEN 5-325 MG PO TABS: 1.0000 | ORAL_TABLET | ORAL | 0 refills | Status: DC

## 2019-01-08 ENCOUNTER — Other Ambulatory Visit: Payer: Self-pay | Admitting: *Deleted

## 2019-01-08 DIAGNOSIS — M25561 Pain in right knee: Principal | ICD-10-CM

## 2019-01-08 DIAGNOSIS — M25562 Pain in left knee: Principal | ICD-10-CM

## 2019-01-08 DIAGNOSIS — G8929 Other chronic pain: Secondary | ICD-10-CM

## 2019-01-08 MED ORDER — HYDROCODONE-ACETAMINOPHEN 5-325 MG PO TABS: 1.0000 | ORAL_TABLET | ORAL | 0 refills | Status: DC

## 2019-01-08 NOTE — Telephone Encounter (Signed)
Received [facility] Order Pended Rx and sent to [facility] for approval.  Confirmed patient with Ivin Booty.

## 2019-01-14 ENCOUNTER — Other Ambulatory Visit: Payer: Self-pay | Admitting: *Deleted

## 2019-01-14 DIAGNOSIS — F411 Generalized anxiety disorder: Secondary | ICD-10-CM

## 2019-01-14 MED ORDER — LORAZEPAM 0.5 MG PO TABS: ORAL_TABLET | ORAL | 0 refills | Status: DC

## 2019-01-14 NOTE — Telephone Encounter (Signed)
Received Fax order from [facility] and sent to Dr. Lyndel Safe for approval.

## 2019-02-02 ENCOUNTER — Encounter: Payer: Self-pay | Admitting: Nurse Practitioner

## 2019-02-02 ENCOUNTER — Non-Acute Institutional Stay: Payer: Medicare Other | Admitting: Nurse Practitioner

## 2019-02-02 DIAGNOSIS — M25562 Pain in left knee: Secondary | ICD-10-CM

## 2019-02-02 DIAGNOSIS — K5909 Other constipation: Secondary | ICD-10-CM | POA: Diagnosis not present

## 2019-02-02 DIAGNOSIS — F411 Generalized anxiety disorder: Secondary | ICD-10-CM

## 2019-02-02 DIAGNOSIS — I739 Peripheral vascular disease, unspecified: Secondary | ICD-10-CM | POA: Diagnosis not present

## 2019-02-02 DIAGNOSIS — K219 Gastro-esophageal reflux disease without esophagitis: Secondary | ICD-10-CM

## 2019-02-02 DIAGNOSIS — M25561 Pain in right knee: Secondary | ICD-10-CM | POA: Diagnosis not present

## 2019-02-02 DIAGNOSIS — G8929 Other chronic pain: Secondary | ICD-10-CM

## 2019-02-02 NOTE — Assessment & Plan Note (Signed)
Minimal peripheral edema BLE, continue Spironolactone 12.5mg  qd, Furosemide 20mg  qd.

## 2019-02-02 NOTE — Assessment & Plan Note (Signed)
Stable, able to furniture/walker to walk, continue Tylenol 650mg  bid, Norco 4/325mg  qd.

## 2019-02-02 NOTE — Progress Notes (Addendum)
Location:  [facility] Room Number: [place] of Service:  ALF [phone]) Provider:  Juleon Narang Otho Darner, NP  [name], MD  Patient Care Team: [name], MD as PCP - General (Internal Medicine) [name], MD (Ophthalmology) [name], MD (Psychiatry) Keionna Kinnaird X, NP as Nurse Practitioner (Nurse Practitioner) [name], MD as Consulting Physician (Orthopedic Surgery)  Extended Emergency Contact Information Primary Emergency Contact: [name], [place] of [place] Work Phone: [phone] Relation: Son Secondary Emergency Contact: [name] [place] of [place] Phone: [phone] Relation: Sister  Code Status:  DNR Goals of care: Advanced Directive information Advanced Directives 02/02/2019  Does Patient Have a Medical Advance Directive? Yes  Type of Advance Directive Out of facility DNR (pink MOST or yellow form);Healthcare Power of Attorney  Does patient want to make changes to medical advance directive? No - Patient declined  Copy of [place] in Chart? Yes - validated most recent copy scanned in chart (See row information)  Pre-existing out of facility DNR order (yellow form or pink MOST form) Yellow form placed in chart (order not valid for inpatient use)     Chief Complaint  Patient presents with   Medical Management of Chronic Issues    Routine Visit    HPI:  Pt is a 78 y.o. female seen today for medical management of chronic diseases.     The patient has chronic constipation, stable on MiraLax bid, MOM prn, Linzess 276mcg qd, Ducolax tab/suppository prn. Peripheral edema, minimal, on Spironolactone 12.5mg  qd, Furosemide 20mg  qd. Her mood is stable, on Lorazepam 0.5mg  bid, Amitriptyline 50mg  qd, Librium 25mg  prn,  Fluphenazine 1mg  qd. Knee pain, managed with Tylenol 650mg  bid, Norco 4/325mg  qd. GERD stable on Famotidine 20mg  qd, Mylanta 30mg  qhs and prn.    Past  Medical History:  Diagnosis Date   Allergy    Anemia    Anxiety    Asthma    CAD (coronary artery disease)    Depression    Diverticulosis    Dysfunction of eustachian tube    Elevated LFTs    Esophageal reflux    Esophageal stricture 2002   Hemorrhoids    History of rectal polyps    HLD (hyperlipidemia)    HTN (hypertension)    Hypertonicity of bladder    Irritable bowel syndrome with constipation    Obesity    Optic neuritis    Osteoporosis    Peptic ulcer disease    Peripheral neuropathy    Trigeminal neuralgia    prior injury   Visual loss, bilateral    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, 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   Vitamin D deficiency    Past Surgical History:  Procedure Laterality Date   ABDOMINAL HYSTERECTOMY     ANAL RECTAL MANOMETRY N/A 07/05/2014   Procedure: ANO RECTAL MANOMETRY;  Surgeon: Leighton Ruff, MD;  Location: [facility];  Service: Endoscopy;  Laterality: N/A;   APPENDECTOMY     CAROTID ENDARTERECTOMY     COLONOSCOPY  2008, 2015   TONSILLECTOMY     UPPER GASTROINTESTINAL ENDOSCOPY  2002    Allergies  Allergen Reactions   Azithromycin    Biaxin [Clarithromycin]    Clarithromycin    Doxycycline    Fosamax [Alendronate Sodium]    Geodon [Ziprasidone Hydrochloride]    Lipitor [Atorvastatin Calcium]    Penicillins    Pravachol [Pravastatin Sodium]  Statins    Sulfonamide Derivatives    Zocor [Simvastatin]     Outpatient Encounter Medications as of 02/02/2019  Medication Sig   acetaminophen (TYLENOL) 325 MG tablet Take 650 mg by mouth every 8 (eight) hours as needed. AS NEEDED FOR PAIN *NOT TO EXCEED 3,000 MG IN 24HRS*   acetaminophen (TYLENOL) 650 MG CR tablet Take 650 mg by mouth 2 (two) times daily.    alum & mag hydroxide-simeth (MYLANTA) 200-200-20 MG/5ML suspension Take 30 mLs by mouth at bedtime.   alum & mag hydroxide-simeth (MYLANTA) 200-200-20 MG/5ML suspension Take 30  mLs by mouth every 4 (four) hours as needed for indigestion or heartburn.   amitriptyline (ELAVIL) 50 MG tablet Take 50 mg by mouth at bedtime.   aspirin 81 MG EC tablet Take 81 mg by mouth daily.     betamethasone valerate (VALISONE) 0.1 % cream Apply 1 application topically 2 (two) times daily. Apply  thin amount to vulva twice daily   bisacodyl (DULCOLAX) 10 MG suppository Place 10 mg rectally daily as needed for moderate constipation.   bisacodyl (DULCOLAX) 5 MG EC tablet Take 5 mg by mouth daily as needed for moderate constipation.   calcium-vitamin D (OSCAL WITH D) 250-125 MG-UNIT tablet Take 1 tablet by mouth 2 (two) times daily. Reported on 12/27/2015   carbamide peroxide (DEBROX) 6.5 % OTIC solution Place 5 drops to affected ear every night for 3 days, then irrigate with bulb syringe. If not effective, notify MD.   chlordiazePOXIDE (LIBRIUM) 25 MG capsule Take 25 mg by mouth daily as needed (in addition to the scheduled dose at bedtime).    famotidine (PEPCID) 20 MG tablet Take 20 mg by mouth at bedtime.   fluPHENAZine (PROLIXIN) 1 MG tablet Take 1 mg by mouth at bedtime.   fluticasone (FLONASE) 50 MCG/ACT nasal spray Place 1 spray into both nostrils 2 (two) times daily.   furosemide (LASIX) 20 MG tablet Take 20 mg by mouth daily.    HYDROcodone-acetaminophen (NORCO/VICODIN) 5-325 MG tablet Take 1 tablet by mouth every morning.   hydrocortisone 2.5 % cream Apply 1 application topically 2 (two) times daily as needed.   hydrocortisone cream 1 % Apply 1 application topically 2 (two) times daily.   linaclotide (LINZESS) 290 MCG CAPS capsule Take 290 mcg by mouth daily.   loratadine (CLARITIN) 10 MG tablet Take 10 mg by mouth daily as needed for allergies.   LORazepam (ATIVAN) 0.5 MG tablet Take one tablet by mouth twice daily for anxiety   magnesium hydroxide (MILK OF MAGNESIA) 400 MG/5ML suspension Take 30 mLs by mouth daily as needed.    mometasone (ELOCON) 0.1 % cream  May self administer to face twice daily as needed.   Multiple Vitamins-Minerals (CENTRUM SILVER PO) Take 1 tablet by mouth.     polyethylene glycol (MIRALAX / GLYCOLAX) packet Take 17 g by mouth 2 (two) times daily.   polyvinyl alcohol (LIQUIFILM TEARS) 1.4 % ophthalmic solution Place 1 drop into both eyes 3 (three) times daily.   saccharomyces boulardii (FLORASTOR) 250 MG capsule Take 250 mg by mouth daily.    spironolactone (ALDACTONE) 25 MG tablet Take 12.5 mg by mouth daily.   [DISCONTINUED] spironolactone (ALDACTONE) 12.5 mg TABS tablet Take 12.5 mg by mouth daily.   No facility-administered encounter medications on file as of 02/02/2019.    ROS was provided with assistance of staff.  Review of Systems  Constitutional: Negative for activity change, appetite change, chills, diaphoresis, fatigue, fever and unexpected weight  change.       Same weight about a year ago.   HENT: Positive for hearing loss. Negative for congestion and voice change.   Eyes: Positive for visual disturbance.       Low vision.   Respiratory: Negative for cough and shortness of breath.   Cardiovascular: Positive for leg swelling. Negative for chest pain and palpitations.  Gastrointestinal: Negative for abdominal distention, abdominal pain, constipation, diarrhea, nausea and vomiting.  Genitourinary: Negative for difficulty urinating, dysuria and urgency.  Musculoskeletal: Positive for arthralgias and gait problem.  Skin: Negative for color change and pallor.  Neurological: Negative for speech difficulty, weakness and headaches.       Memory lapses.   Psychiatric/Behavioral: Negative for agitation, behavioral problems, hallucinations and sleep disturbance. The patient is not nervous/anxious.     Immunization History  Administered Date(s) Administered   Influenza Split 07/23/2013   Influenza Whole 07/05/2009, 06/26/2018   Influenza-Unspecified 07/29/2014, 06/14/2015, 07/11/2016, 07/15/2017   PPD Test  06/17/2013   Pneumococcal Conjugate-13 07/22/2017   Pneumococcal Polysaccharide-23 05/05/2009   Td 05/05/2009   Zoster 01/09/2011   Pertinent  Health Maintenance Due  Topic Date Due   INFLUENZA VACCINE  04/25/2019   DEXA SCAN  Completed   PNA vac Low Risk Adult  Completed   Fall Risk  06/10/2018 06/04/2017 07/29/2014  Falls in the past year? No Yes No  Number falls in past yr: - 1 -  Injury with Fall? - No -   Functional Status Survey:    Vitals:   02/02/19 1439  BP: 122/64  Pulse: 68  Resp: 18  Temp: 98.5 F (36.9 C)  TempSrc: Oral  SpO2: 98%  Weight: 145 lb 9.6 oz (66 kg)  Height: 5' 1.5" (1.562 m)   Body mass index is 27.07 kg/m. Physical Exam Vitals signs and nursing note reviewed.  Constitutional:      General: She is not in acute distress.    Appearance: Normal appearance. She is normal weight. She is not ill-appearing, toxic-appearing or diaphoretic.     Comments: Over weight  HENT:     Head: Normocephalic and atraumatic.     Nose: Nose normal. No congestion or rhinorrhea.     Mouth/Throat:     Mouth: Mucous membranes are moist.  Eyes:     Extraocular Movements: Extraocular movements intact.     Conjunctiva/sclera: Conjunctivae normal.     Pupils: Pupils are equal, round, and reactive to light.     Comments: Legally blind.   Neck:     Musculoskeletal: Normal range of motion and neck supple.  Cardiovascular:     Rate and Rhythm: Normal rate and regular rhythm.     Heart sounds: No murmur.  Pulmonary:     Effort: Pulmonary effort is normal.     Breath sounds: No wheezing, rhonchi or rales.  Abdominal:     General: Bowel sounds are normal. There is no distension.     Palpations: Abdomen is soft.     Tenderness: There is no abdominal tenderness. There is no left CVA tenderness, guarding or rebound.  Musculoskeletal:     Right lower leg: Edema present.     Left lower leg: Edema present.     Comments: Trace edema BLE  Skin:    General: Skin is  warm and dry.  Neurological:     General: No focal deficit present.     Mental Status: She is alert. Mental status is at baseline.     Cranial Nerves:  No cranial nerve deficit.     Motor: No weakness.     Coordination: Coordination normal.     Gait: Gait abnormal.     Comments: Oriented to person and place. Furniture/walker to ambulate.   Psychiatric:        Mood and Affect: Mood normal.        Behavior: Behavior normal.     Labs reviewed: Recent Labs    06/24/18 11/21/18  NA 137 141  K 4.3 4.3  BUN 15 23*  CREATININE 1.2* 1.1   Recent Labs    06/24/18 11/21/18  AST 23 26  ALT 19 21  ALKPHOS 121 103   Recent Labs    06/24/18 11/21/18  WBC 6.0 6.5  HGB 12.9 13.4  HCT 38 39  PLT 401* 329   Lab Results  Component Value Date   TSH 2.52 11/21/2018   Lab Results  Component Value Date   HGBA1C 5.9 02/05/2017   Lab Results  Component Value Date   CHOL 247 (A) 11/21/2018   HDL 72 (A) 11/21/2018   LDLCALC 147 11/21/2018   LDLDIRECT 190.0 03/24/2012   TRIG 150 11/21/2018   CHOLHDL 4 03/24/2012    Significant Diagnostic Results in last 30 days:  No results found.  Assessment/Plan PVD (peripheral vascular disease) (HCC) Minimal peripheral edema BLE, continue Spironolactone 12.5mg  qd, Furosemide 20mg  qd.   Constipation Stable, continue MiraLax bid, MOM prn, Linzess 253mcg qd, Ducolax tab/suppository prn.  Anxiety state mood is stable, continue Lorazepam 0.5mg  bid, Amitriptyline 50mg  qd, Librium 25mg  prn,  Fluphenazine 1mg  qd.   Chronic pain of both knees Stable, able to furniture/walker to walk, continue Tylenol 650mg  bid, Norco 4/325mg  qd.   GERD (gastroesophageal reflux disease) GERD stable, continue Famotidine 20mg  qd, Mylanta 30mg  qhs  '   Family/ staff Communication: plan of care reviewed with the patient and charge nurse.   Labs/tests ordered:  none  Time spend 40 minutes.

## 2019-02-02 NOTE — Assessment & Plan Note (Signed)
mood is stable, continue Lorazepam 0.5mg  bid, Amitriptyline 50mg  qd, Librium 25mg  prn,  Fluphenazine 1mg  qd.

## 2019-02-02 NOTE — Assessment & Plan Note (Signed)
GERD stable, continue Famotidine 20mg  qd, Mylanta 30mg  qhs

## 2019-02-02 NOTE — Assessment & Plan Note (Signed)
Stable, continue MiraLax bid, MOM prn, Linzess 288mcg qd, Ducolax tab/suppository prn.

## 2019-02-04 NOTE — Addendum Note (Signed)
Addended by: Dorothea Ogle X on: 02/04/2019 04:45 PM   Modules accepted: Level of Service

## 2019-02-09 ENCOUNTER — Other Ambulatory Visit: Payer: Self-pay | Admitting: *Deleted

## 2019-02-09 DIAGNOSIS — M25561 Pain in right knee: Secondary | ICD-10-CM

## 2019-02-09 DIAGNOSIS — G8929 Other chronic pain: Secondary | ICD-10-CM

## 2019-02-09 DIAGNOSIS — F411 Generalized anxiety disorder: Secondary | ICD-10-CM

## 2019-02-09 MED ORDER — HYDROCODONE-ACETAMINOPHEN 5-325 MG PO TABS: 1.0000 | ORAL_TABLET | ORAL | 0 refills | Status: DC

## 2019-02-09 MED ORDER — LORAZEPAM 0.5 MG PO TABS: ORAL_TABLET | ORAL | 0 refills | Status: DC

## 2019-02-09 NOTE — Telephone Encounter (Signed)
Fax order received from [facility] and sent to [facility] for approval.

## 2019-03-11 ENCOUNTER — Other Ambulatory Visit: Payer: Self-pay | Admitting: *Deleted

## 2019-03-11 DIAGNOSIS — M25562 Pain in left knee: Secondary | ICD-10-CM

## 2019-03-11 DIAGNOSIS — G8929 Other chronic pain: Secondary | ICD-10-CM

## 2019-03-11 MED ORDER — HYDROCODONE-ACETAMINOPHEN 5-325 MG PO TABS: 1.0000 | ORAL_TABLET | ORAL | 0 refills | Status: DC

## 2019-03-11 NOTE — Telephone Encounter (Signed)
Received fax from [facility] and sent to [facility] for approval.

## 2019-03-13 ENCOUNTER — Other Ambulatory Visit: Payer: Self-pay | Admitting: *Deleted

## 2019-03-13 MED ORDER — CHLORDIAZEPOXIDE HCL 25 MG PO CAPS: ORAL_CAPSULE | ORAL | 0 refills | Status: DC

## 2019-03-13 NOTE — Telephone Encounter (Signed)
Received fax from [facility] requesting  Pended and sent to [facility] for approval.

## 2019-04-07 ENCOUNTER — Other Ambulatory Visit: Payer: Self-pay | Admitting: Internal Medicine

## 2019-04-07 DIAGNOSIS — M25562 Pain in left knee: Secondary | ICD-10-CM

## 2019-04-07 DIAGNOSIS — G8929 Other chronic pain: Secondary | ICD-10-CM

## 2019-04-07 MED ORDER — HYDROCODONE-ACETAMINOPHEN 5-325 MG PO TABS: 1.0000 | ORAL_TABLET | ORAL | 0 refills | Status: DC

## 2019-04-15 ENCOUNTER — Other Ambulatory Visit: Payer: Self-pay | Admitting: *Deleted

## 2019-04-15 DIAGNOSIS — F411 Generalized anxiety disorder: Secondary | ICD-10-CM

## 2019-04-15 MED ORDER — LORAZEPAM 0.5 MG PO TABS: ORAL_TABLET | ORAL | 0 refills | Status: DC

## 2019-04-15 MED ORDER — CHLORDIAZEPOXIDE HCL 25 MG PO CAPS: ORAL_CAPSULE | ORAL | 0 refills | Status: DC

## 2019-04-15 NOTE — Telephone Encounter (Signed)
Received fax order from [facility] requesting refill Pended and sent to Dr. Lyndel Safe for approval.

## 2019-05-11 ENCOUNTER — Other Ambulatory Visit: Payer: Self-pay | Admitting: *Deleted

## 2019-05-11 DIAGNOSIS — G8929 Other chronic pain: Secondary | ICD-10-CM

## 2019-05-11 MED ORDER — HYDROCODONE-ACETAMINOPHEN 5-325 MG PO TABS: 1.0000 | ORAL_TABLET | ORAL | 0 refills | Status: DC

## 2019-05-11 NOTE — Telephone Encounter (Signed)
[facility] Fax Order Request Pended Rx and sent to Dr. Lyndel Safe for approval.

## 2019-05-18 ENCOUNTER — Other Ambulatory Visit: Payer: Self-pay | Admitting: *Deleted

## 2019-05-18 MED ORDER — CHLORDIAZEPOXIDE HCL 25 MG PO CAPS: ORAL_CAPSULE | ORAL | 0 refills | Status: DC

## 2019-05-18 NOTE — Telephone Encounter (Signed)
Received fax from [facility] requesting Refill Pended Rx and sent to Dr. Lyndel Safe for approval.

## 2019-06-05 ENCOUNTER — Other Ambulatory Visit: Payer: Self-pay | Admitting: *Deleted

## 2019-06-05 DIAGNOSIS — G8929 Other chronic pain: Secondary | ICD-10-CM

## 2019-06-05 DIAGNOSIS — M25561 Pain in right knee: Secondary | ICD-10-CM

## 2019-06-05 MED ORDER — HYDROCODONE-ACETAMINOPHEN 5-325 MG PO TABS: 1.0000 | ORAL_TABLET | ORAL | 0 refills | Status: DC

## 2019-06-05 NOTE — Telephone Encounter (Signed)
Fax Refill Request from [facility] and sent to [facility] for approval.

## 2019-06-15 ENCOUNTER — Other Ambulatory Visit: Payer: Self-pay | Admitting: *Deleted

## 2019-06-15 DIAGNOSIS — F411 Generalized anxiety disorder: Secondary | ICD-10-CM

## 2019-06-15 MED ORDER — CHLORDIAZEPOXIDE HCL 25 MG PO CAPS: ORAL_CAPSULE | ORAL | 0 refills | Status: DC

## 2019-06-15 MED ORDER — LORAZEPAM 0.5 MG PO TABS: ORAL_TABLET | ORAL | 0 refills | Status: DC

## 2019-06-15 NOTE — Telephone Encounter (Signed)
Received fax order Pended Rx's and sent to Dr. Lyndel Safe for approval.

## 2019-06-25 LAB — BASIC METABOLIC PANEL
BUN: 16 (ref 4–21)
Creatinine: 1 (ref 0.5–1.1)
Glucose: 90
Potassium: 4.2 (ref 3.4–5.3)
Sodium: 141 (ref 137–147)

## 2019-07-01 ENCOUNTER — Non-Acute Institutional Stay: Payer: Medicare Other | Admitting: Nurse Practitioner

## 2019-07-01 ENCOUNTER — Encounter: Payer: Self-pay | Admitting: Nurse Practitioner

## 2019-07-01 DIAGNOSIS — K219 Gastro-esophageal reflux disease without esophagitis: Secondary | ICD-10-CM | POA: Diagnosis not present

## 2019-07-01 DIAGNOSIS — M23201 Derangement of unspecified lateral meniscus due to old tear or injury, left knee: Secondary | ICD-10-CM | POA: Diagnosis not present

## 2019-07-01 DIAGNOSIS — K5909 Other constipation: Secondary | ICD-10-CM | POA: Diagnosis not present

## 2019-07-01 DIAGNOSIS — I739 Peripheral vascular disease, unspecified: Secondary | ICD-10-CM

## 2019-07-01 LAB — CHLORIDE
Calcium: 9.5
Carbon Dioxide, Total: 26
Chloride: 105

## 2019-07-01 NOTE — Assessment & Plan Note (Signed)
stable, continue MOM prn, MiraLax bid.

## 2019-07-01 NOTE — Assessment & Plan Note (Signed)
BLE edema, trace, continue  Furosemide 20mg  qd, Spironolactone 12.5mg  qd.

## 2019-07-01 NOTE — Assessment & Plan Note (Signed)
Stable, continue Famotidine 20mg qd.  

## 2019-07-01 NOTE — Assessment & Plan Note (Signed)
Knee pain, controlled, continue Norco 5/325mg  qd.

## 2019-07-01 NOTE — Progress Notes (Signed)
Location:   [place] Room Number: [id number] Place of Service:  ALF (13) Provider:  Keora Eccleston NP  [name], MD  Patient Care Team: [name], MD as PCP - General (Internal Medicine) [name], MD (Ophthalmology) [name], MD (Psychiatry) Kenderick Kobler X, NP as Nurse Practitioner (Nurse Practitioner) [name], MD as Consulting Physician (Orthopedic Surgery)  Extended Emergency Contact Information Primary Emergency Contact: [name], [place] Work Phone: [phone] Relation: Son Secondary Emergency Contact: [name] [place] Phone: [phone] Relation: Sister  Code Status:  DNR Goals of care: Advanced Directive information Advanced Directives 07/01/2019  Does Patient Have a Medical Advance Directive? Yes  Type of Advance Directive Out of facility DNR (pink MOST or yellow form)  Does patient want to make changes to medical advance directive? No - Patient declined  Copy of [place] in Chart? -  Pre-existing out of facility DNR order (yellow form or pink MOST form) -     Chief Complaint  Patient presents with  . Medical Management of Chronic Issues  . Health Maintenance    TDAP and influenza vaccine    HPI:  Pt is a 78 y.o. female seen today for medical management of chronic diseases.    The patient resides in [place] [facility] for safety, care assistance, her mood is stable, on Amitriptyline 50mg  qd, Librium 25mg  qd, Lorazepam 0.5mg  bid,  Prolixin 1mg  qd. GERD, stable, on Famotidine 20mg  qd. BLE edema, trace, on Furosemide 20mg  qd, Spironolactone 12.5mg  qd.  Knee pain, controlled, on Norco 5/325mg  qd. Constipation, stable, on MOM prn, MiraLax bid.   Past Medical History:  Diagnosis Date  . Allergy   . Anemia   . Anxiety   . Asthma   . CAD (coronary artery disease)   . Depression   . Diverticulosis   . Dysfunction of eustachian tube   . Elevated LFTs   .  Esophageal reflux   . Esophageal stricture 2002  . Hemorrhoids   . History of rectal polyps   . HLD (hyperlipidemia)   . HTN (hypertension)   . Hypertonicity of bladder   . Irritable bowel syndrome with constipation   . Obesity   . Optic neuritis   . Osteoporosis   . Peptic ulcer disease   . Peripheral neuropathy   . Trigeminal neuralgia    prior injury  . 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 Vitamin D deficiency    Past Surgical History:  Procedure Laterality Date  . ABDOMINAL HYSTERECTOMY    . ANAL RECTAL MANOMETRY N/A 07/05/2014   Procedure: ANO RECTAL MANOMETRY;  Surgeon: Leighton Ruff, MD;  Location: [facility];  Service: Endoscopy;  Laterality: N/A;  . APPENDECTOMY    . CAROTID ENDARTERECTOMY    . COLONOSCOPY  2008, 2015  . TONSILLECTOMY    . UPPER GASTROINTESTINAL ENDOSCOPY  2002    Allergies  Allergen Reactions  . Azithromycin   . Biaxin [Clarithromycin]   . Clarithromycin   . Doxycycline   . Fosamax [Alendronate Sodium]   . Geodon [Ziprasidone Hydrochloride]   . Lipitor [Atorvastatin Calcium]   . Penicillins   . Pravachol [Pravastatin Sodium]   . Statins   . Sulfonamide Derivatives   . Zocor [Simvastatin]     Allergies as of 07/01/2019      Reactions  Azithromycin    Biaxin [clarithromycin]    Clarithromycin    Doxycycline    Fosamax [alendronate Sodium]    Geodon [ziprasidone Hydrochloride]    Lipitor [atorvastatin Calcium]    Penicillins    Pravachol [pravastatin Sodium]    Statins    Sulfonamide Derivatives    Zocor [simvastatin]       Medication List       Accurate as of July 01, 2019  3:24 PM. If you have any questions, ask your nurse or doctor.        STOP taking these medications   ARTIFICIAL TE[employer] OP Stopped by: Kamare Caspers X Shyleigh Daughtry, NP     TAKE these medications   acetaminophen 650 MG CR tablet Commonly known as: TYLENOL Take 650 mg by mouth 2 (two) times  daily.   acetaminophen 325 MG tablet Commonly known as: TYLENOL Take 650 mg by mouth every 8 (eight) hours as needed. AS NEEDED FOR PAIN *NOT TO EXCEED 3,000 MG IN 24HRS*   amitriptyline 50 MG tablet Commonly known as: ELAVIL Take 50 mg by mouth at bedtime.   aspirin 81 MG EC tablet Take 81 mg by mouth daily.   betamethasone valerate 0.1 % cream Commonly known as: VALISONE Apply 1 application topically 2 (two) times daily. Apply  thin amount to vulva twice daily   bisacodyl 10 MG suppository Commonly known as: DULCOLAX Place 10 mg rectally daily as needed for moderate constipation.   bisacodyl 5 MG EC tablet Commonly known as: DULCOLAX Take 5 mg by mouth daily as needed for moderate constipation.   calcium-vitamin D 250-125 MG-UNIT tablet Commonly known as: OSCAL WITH D Take 1 tablet by mouth 2 (two) times daily. Reported on 12/27/2015   camphor-menthol lotion Commonly known as: SARNA Apply topically. 0.5-0.5 % Three times a day as needed to mid back   carbamide peroxide 6.5 % OTIC solution Commonly known as: DEBROX Place 5 drops to affected ear every night for 3 days, then irrigate with bulb syringe. If not effective, notify MD.   CENTRUM SILVER PO Take 1 tablet by mouth.   chlordiazePOXIDE 25 MG capsule Commonly known as: LIBRIUM Take one capsule by mouth at bedtime   famotidine 20 MG tablet Commonly known as: PEPCID Take 20 mg by mouth at bedtime.   fluPHENAZine 1 MG tablet Commonly known as: PROLIXIN Take 1 mg by mouth at bedtime.   fluticasone 50 MCG/ACT nasal spray Commonly known as: FLONASE Place 1 spray into both nostrils 2 (two) times daily.   furosemide 20 MG tablet Commonly known as: LASIX Take 20 mg by mouth daily.   HYDROcodone-acetaminophen 5-325 MG tablet Commonly known as: NORCO/VICODIN Take 1 tablet by mouth every morning.   hydrocortisone 2.5 % cream Apply 1 application topically 2 (two) times daily as needed.   hydrocortisone cream 1  % Apply 1 application topically 2 (two) times daily.   Linzess 290 MCG Caps capsule Generic drug: linaclotide Take 290 mcg by mouth daily.   loratadine 10 MG tablet Commonly known as: CLARITIN Take 10 mg by mouth daily as needed for allergies.   LORazepam 0.5 MG tablet Commonly known as: ATIVAN Take one tablet by mouth twice daily for anxiety   magnesium hydroxide 400 MG/5ML suspension Commonly known as: MILK OF MAGNESIA Take 30 mLs by mouth daily as needed.   mometasone 0.1 % cream Commonly known as: ELOCON May self administer to face twice daily as needed.   Mylanta 200-200-20 MG/5ML suspension Generic drug: alum &  mag hydroxide-simeth Take 30 mLs by mouth at bedtime.   Mylanta 200-200-20 MG/5ML suspension Generic drug: alum & mag hydroxide-simeth Take 30 mLs by mouth every 4 (four) hours as needed for indigestion or heartburn.   polyethylene glycol 17 g packet Commonly known as: MIRALAX / GLYCOLAX Take 17 g by mouth 2 (two) times daily.   polyvinyl alcohol 1.4 % ophthalmic solution Commonly known as: LIQUIFILM TEARS Place 1 drop into both eyes 3 (three) times daily.   saccharomyces boulardii 250 MG capsule Commonly known as: FLORASTOR Take 250 mg by mouth daily.   spironolactone 25 MG tablet Commonly known as: ALDACTONE Take 12.5 mg by mouth daily.      ROS was provided with assistance of staff Review of Systems  Constitutional: Negative for activity change, appetite change, chills, diaphoresis, fatigue, fever and unexpected weight change.  HENT: Positive for hearing loss. Negative for congestion and voice change.   Eyes: Positive for visual disturbance.       Low vision  Respiratory: Negative for cough, shortness of breath and wheezing.   Cardiovascular: Positive for leg swelling. Negative for chest pain and palpitations.  Gastrointestinal: Negative for abdominal distention, abdominal pain, constipation, diarrhea, nausea and vomiting.  Genitourinary:  Negative for difficulty urinating, dysuria and urgency.  Musculoskeletal: Positive for arthralgias and gait problem.  Skin: Negative for color change and pallor.  Neurological: Negative for dizziness, speech difficulty, weakness and headaches.       Memory lapses.   Psychiatric/Behavioral: Negative for agitation, behavioral problems, hallucinations and sleep disturbance. The patient is not nervous/anxious.     Immunization History  Administered Date(s) Administered  . Influenza Split 07/23/2013  . Influenza Whole 07/05/2009, 06/26/2018  . Influenza-Unspecified 07/29/2014, 06/14/2015, 07/11/2016, 07/15/2017  . PPD Test 06/17/2013  . Pneumococcal Conjugate-13 07/22/2017  . Pneumococcal Polysaccharide-23 05/05/2009  . Td 05/05/2009  . Zoster 01/09/2011   Pertinent  Health Maintenance Due  Topic Date Due  . INFLUENZA VACCINE  04/25/2019  . DEXA SCAN  Completed  . PNA vac Low Risk Adult  Completed   Fall Risk  06/10/2018 06/04/2017 07/29/2014  Falls in the past year? No Yes No  Number falls in past yr: - 1 -  Injury with Fall? - No -   Functional Status Survey:    Vitals:   07/01/19 0844  BP: 120/60  Pulse: 64  Resp: 18  Temp: 98.2 F (36.8 C)  SpO2: 95%  Weight: 141 lb (64 kg)  Height: 5' 1.5" (1.562 m)   Body mass index is 26.21 kg/m. Physical Exam Constitutional:      General: She is not in acute distress.    Appearance: Normal appearance. She is not ill-appearing, toxic-appearing or diaphoretic.     Comments: Over weight  HENT:     Head: Normocephalic.     Nose: Nose normal.     Mouth/Throat:     Mouth: Mucous membranes are moist.  Eyes:     Extraocular Movements: Extraocular movements intact.     Conjunctiva/sclera: Conjunctivae normal.     Pupils: Pupils are equal, round, and reactive to light.     Comments: Legally blind  Neck:     Musculoskeletal: Normal range of motion and neck supple.  Cardiovascular:     Rate and Rhythm: Normal rate and regular  rhythm.     Heart sounds: No murmur.  Pulmonary:     Breath sounds: No wheezing, rhonchi or rales.  Abdominal:     General: Bowel sounds are normal. There  is no distension.     Palpations: Abdomen is soft.     Tenderness: There is no abdominal tenderness. There is no right CVA tenderness, left CVA tenderness, guarding or rebound.  Musculoskeletal:     Right lower leg: Edema present.     Left lower leg: Edema present.     Comments: Trace edema BLE  Skin:    General: Skin is warm and dry.  Neurological:     General: No focal deficit present.     Mental Status: She is alert. Mental status is at baseline.     Cranial Nerves: No cranial nerve deficit.     Motor: No weakness.     Coordination: Coordination normal.     Gait: Gait abnormal.     Comments: Oriented to person, place.   Psychiatric:        Mood and Affect: Mood normal.        Behavior: Behavior normal.        Thought Content: Thought content normal.        Judgment: Judgment normal.     Labs reviewed: Recent Labs    11/21/18 06/25/19  NA 141 141  K 4.3 4.2  CL  --  105  CO2  --  26  BUN 23* 16  CREATININE 1.1 1.0  CALCIUM  --  9.5   Recent Labs    11/21/18  AST 26  ALT 21  ALKPHOS 103   Recent Labs    11/21/18  WBC 6.5  HGB 13.4  HCT 39  PLT 329   Lab Results  Component Value Date   TSH 2.52 11/21/2018   Lab Results  Component Value Date   HGBA1C 5.9 02/05/2017   Lab Results  Component Value Date   CHOL 247 (A) 11/21/2018   HDL 72 (A) 11/21/2018   LDLCALC 147 11/21/2018   LDLDIRECT 190.0 03/24/2012   TRIG 150 11/21/2018   CHOLHDL 4 03/24/2012    Significant Diagnostic Results in last 30 days:  No results found.  Assessment/Plan Constipation stable, continue MOM prn, MiraLax bid.    GERD (gastroesophageal reflux disease) Stable, continue Famotidine 20mg  qd.   PVD (peripheral vascular disease) (HCC) BLE edema, trace, continue  Furosemide 20mg  qd, Spironolactone 12.5mg  qd.   Tear  of meniscus of left knee Knee pain, controlled, continue Norco 5/325mg  qd.     Family/ staff Communication: plan of care reviewed with the patient and charge nurse. Tdap prescription provided.   Labs/tests ordered:  none  Time spend 40 minutes.

## 2019-07-02 ENCOUNTER — Other Ambulatory Visit: Payer: Self-pay | Admitting: *Deleted

## 2019-07-02 DIAGNOSIS — G8929 Other chronic pain: Secondary | ICD-10-CM

## 2019-07-02 DIAGNOSIS — M25562 Pain in left knee: Secondary | ICD-10-CM

## 2019-07-02 MED ORDER — HYDROCODONE-ACETAMINOPHEN 5-325 MG PO TABS: 1.0000 | ORAL_TABLET | ORAL | 0 refills | Status: DC

## 2019-07-02 NOTE — Telephone Encounter (Signed)
Received fax order request from [place] and sent to Dr. Lyndel Safe for approval

## 2019-07-14 ENCOUNTER — Other Ambulatory Visit: Payer: Self-pay | Admitting: *Deleted

## 2019-07-14 MED ORDER — CHLORDIAZEPOXIDE HCL 25 MG PO CAPS: ORAL_CAPSULE | ORAL | 0 refills | Status: DC

## 2019-07-14 NOTE — Telephone Encounter (Signed)
Received fax request from [facility]. Pended Rx and sent to Dr. Gupta for approval.  

## 2019-08-06 ENCOUNTER — Other Ambulatory Visit: Payer: Self-pay

## 2019-08-06 DIAGNOSIS — G8929 Other chronic pain: Secondary | ICD-10-CM

## 2019-08-06 DIAGNOSIS — M25561 Pain in right knee: Secondary | ICD-10-CM

## 2019-08-06 MED ORDER — HYDROCODONE-ACETAMINOPHEN 5-325 MG PO TABS: 1.0000 | ORAL_TABLET | ORAL | 0 refills | Status: DC

## 2019-08-14 ENCOUNTER — Non-Acute Institutional Stay: Payer: Medicare Other | Admitting: Internal Medicine

## 2019-08-14 DIAGNOSIS — L299 Pruritus, unspecified: Secondary | ICD-10-CM

## 2019-08-14 DIAGNOSIS — I10 Essential (primary) hypertension: Secondary | ICD-10-CM | POA: Diagnosis not present

## 2019-08-14 DIAGNOSIS — F418 Other specified anxiety disorders: Secondary | ICD-10-CM | POA: Diagnosis not present

## 2019-08-14 DIAGNOSIS — R21 Rash and other nonspecific skin eruption: Secondary | ICD-10-CM | POA: Diagnosis not present

## 2019-08-14 NOTE — Progress Notes (Addendum)
Location: [place] of Service:  ALF (13)  Provider:   Code Status:  Goals of Care:  Advanced Directives 07/01/2019  Does Patient Have a Medical Advance Directive? Yes  Type of Advance Directive Out of facility DNR (pink MOST or yellow form)  Does patient want to make changes to medical advance directive? No - Patient declined  Copy of [place] in Chart? -  Pre-existing out of facility DNR order (yellow form or pink MOST form) -     Chief Complaint  Patient presents with  . Acute Visit    HPI: Patient is a 78 y.o. female seen today for an acute visit for Rash in her Back and Face  [place] of [place]. She has h/o Hypertension, Osteoarthritis, GERD , Constipation , Bilateral Visual Loss and  Anxiety with Depression Patient was seen with the Nurse in the room for her c/o Rash on her face and Itching in her Back Patient says she has noticed a rash near her Nasal Folds and Corner of her Mouth. It itches some times but feels Rough Patch. Also c/o itching in her back but no rash was seen  Past Medical History:  Diagnosis Date  . Allergy   . Anemia   . Anxiety   . Asthma   . CAD (coronary artery disease)   . Depression   . Diverticulosis   . Dysfunction of eustachian tube   . Elevated LFTs   . Esophageal reflux   . Esophageal stricture 2002  . Hemorrhoids   . History of rectal polyps   . HLD (hyperlipidemia)   . HTN (hypertension)   . Hypertonicity of bladder   . Irritable bowel syndrome with constipation   . Obesity   . Optic neuritis   . Osteoporosis   . Peptic ulcer disease   . Peripheral neuropathy   . Trigeminal neuralgia    prior injury  . 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 Vitamin D deficiency     Past Surgical History:  Procedure Laterality Date  . ABDOMINAL HYSTERECTOMY    . ANAL RECTAL MANOMETRY N/A 07/05/2014   Procedure: ANO RECTAL  MANOMETRY;  Surgeon: Leighton Ruff, MD;  Location: [facility];  Service: Endoscopy;  Laterality: N/A;  . APPENDECTOMY    . CAROTID ENDARTERECTOMY    . COLONOSCOPY  2008, 2015  . TONSILLECTOMY    . UPPER GASTROINTESTINAL ENDOSCOPY  2002    Allergies  Allergen Reactions  . Azithromycin   . Biaxin [Clarithromycin]   . Clarithromycin   . Doxycycline   . Fosamax [Alendronate Sodium]   . Geodon [Ziprasidone Hydrochloride]   . Lipitor [Atorvastatin Calcium]   . Penicillins   . Pravachol [Pravastatin Sodium]   . Statins   . Sulfonamide Derivatives   . Zocor [Simvastatin]     Outpatient Encounter Medications as of 08/14/2019  Medication Sig  . acetaminophen (TYLENOL) 325 MG tablet Take 650 mg by mouth every 8 (eight) hours as needed. AS NEEDED FOR PAIN *NOT TO EXCEED 3,000 MG IN 24HRS*  . acetaminophen (TYLENOL) 650 MG CR tablet Take 650 mg by mouth 2 (two) times daily.   Marland Kitchen alum & mag hydroxide-simeth (MYLANTA) 200-200-20 MG/5ML suspension Take 30 mLs by mouth at bedtime.  Marland Kitchen alum & mag hydroxide-simeth (MYLANTA) 200-200-20 MG/5ML suspension Take 30 mLs by mouth every 4 (four) hours as needed for indigestion or heartburn.  Marland Kitchen  amitriptyline (ELAVIL) 50 MG tablet Take 50 mg by mouth at bedtime.  Marland Kitchen aspirin 81 MG EC tablet Take 81 mg by mouth daily.    . betamethasone valerate (VALISONE) 0.1 % cream Apply 1 application topically 2 (two) times daily. Apply  thin amount to vulva twice daily  . bisacodyl (DULCOLAX) 10 MG suppository Place 10 mg rectally daily as needed for moderate constipation.  . bisacodyl (DULCOLAX) 5 MG EC tablet Take 5 mg by mouth daily as needed for moderate constipation.  . calcium-vitamin D (OSCAL WITH D) 250-125 MG-UNIT tablet Take 1 tablet by mouth 2 (two) times daily. Reported on 12/27/2015  . camphor-menthol (SARNA) lotion Apply topically. 0.5-0.5 % Three times a day as needed to mid back  . carbamide peroxide (DEBROX) 6.5 % OTIC solution Place 5 drops to affected ear  every night for 3 days, then irrigate with bulb syringe. If not effective, notify MD.  . chlordiazePOXIDE (LIBRIUM) 25 MG capsule Take one capsule by mouth at bedtime  . famotidine (PEPCID) 20 MG tablet Take 20 mg by mouth at bedtime.  . fluPHENAZine (PROLIXIN) 1 MG tablet Take 1 mg by mouth at bedtime.  . fluticasone (FLONASE) 50 MCG/ACT nasal spray Place 1 spray into both nostrils 2 (two) times daily.  . furosemide (LASIX) 20 MG tablet Take 20 mg by mouth daily.   Marland Kitchen HYDROcodone-acetaminophen (NORCO/VICODIN) 5-325 MG tablet Take 1 tablet by mouth every morning.  . hydrocortisone 2.5 % cream Apply 1 application topically 2 (two) times daily as needed.  . hydrocortisone cream 1 % Apply 1 application topically 2 (two) times daily.  Marland Kitchen linaclotide (LINZESS) 290 MCG CAPS capsule Take 290 mcg by mouth daily.  Marland Kitchen loratadine (CLARITIN) 10 MG tablet Take 10 mg by mouth daily as needed for allergies.  Marland Kitchen LORazepam (ATIVAN) 0.5 MG tablet Take one tablet by mouth twice daily for anxiety  . magnesium hydroxide (MILK OF MAGNESIA) 400 MG/5ML suspension Take 30 mLs by mouth daily as needed.   . mometasone (ELOCON) 0.1 % cream May self administer to face twice daily as needed.  . Multiple Vitamins-Minerals (CENTRUM SILVER PO) Take 1 tablet by mouth.    . polyethylene glycol (MIRALAX / GLYCOLAX) packet Take 17 g by mouth 2 (two) times daily.  . polyvinyl alcohol (LIQUIFILM TEARS) 1.4 % ophthalmic solution Place 1 drop into both eyes 3 (three) times daily.  Marland Kitchen saccharomyces boulardii (FLORASTOR) 250 MG capsule Take 250 mg by mouth daily.   Marland Kitchen spironolactone (ALDACTONE) 25 MG tablet Take 12.5 mg by mouth daily.   No facility-administered encounter medications on file as of 08/14/2019.     Review of Systems:  Review of Systems  Review of Systems  Constitutional: Negative for activity change, appetite change, chills, diaphoresis, fatigue and fever.  HENT: Negative for mouth sores, postnasal drip, rhinorrhea, sinus  pain and sore throat.   Respiratory: Negative for apnea, cough, chest tightness, shortness of breath and wheezing.   Cardiovascular: Negative for chest pain, palpitations and leg swelling.  Gastrointestinal: Negative for abdominal distention, abdominal pain, constipation, diarrhea, nausea and vomiting.  Genitourinary: Negative for dysuria and frequency.  Musculoskeletal: Negative for arthralgias, joint swelling and myalgias.   Neurological: Negative for dizziness, syncope, weakness, light-headedness and numbness.  Psychiatric/Behavioral: Negative for behavioral problems, confusion and sleep disturbance.     Health Maintenance  Topic Date Due  . INFLUENZA VACCINE  04/25/2019  . TETANUS/TDAP  05/06/2019  . DEXA SCAN  Completed  . PNA vac Low Risk Adult  Completed    Physical Exam: Vitals:   08/15/19 0954  BP: 120/60  Pulse: 63  Resp: 18  Temp: 98.2 F (36.8 C)  Weight: 140 lb (63.5 kg)   Body mass index is 26.02 kg/m. Physical Exam Vitals signs reviewed.  HENT:     Head: Normocephalic.     Nose: Nose normal.     Mouth/Throat:     Mouth: Mucous membranes are moist.     Pharynx: Oropharynx is clear.  Eyes:     Pupils: Pupils are equal, round, and reactive to light.  Cardiovascular:     Rate and Rhythm: Normal rate.     Pulses: Normal pulses.  Pulmonary:     Effort: Pulmonary effort is normal.     Breath sounds: Normal breath sounds.  Abdominal:     General: Abdomen is flat. Bowel sounds are normal.     Palpations: Abdomen is soft.  Musculoskeletal:        General: No swelling.  Skin:    General: Skin is warm.     Comments: Has white Patches on Nasal Folds and side of mouth  Back did not have any rash   Neurological:     General: No focal deficit present.     Mental Status: She is alert.  Psychiatric:        Mood and Affect: Mood normal.        Thought Content: Thought content normal.        Judgment: Judgment normal.     Labs reviewed: Basic Metabolic  Panel: Recent Labs    11/21/18 06/25/19  NA 141 141  K 4.3 4.2  CL  --  105  CO2  --  26  BUN 23* 16  CREATININE 1.1 1.0  CALCIUM  --  9.5  TSH 2.52  --    Liver Function Tests: Recent Labs    11/21/18  AST 26  ALT 21  ALKPHOS 103   No results for input(s): LIPASE, AMYLASE in the last 8760 hours. No results for input(s): AMMONIA in the last 8760 hours. CBC: Recent Labs    11/21/18  WBC 6.5  HGB 13.4  HCT 39  PLT 329   Lipid Panel: Recent Labs    11/21/18  CHOL 247*  HDL 72*  LDLCALC 147  TRIG 150   Lab Results  Component Value Date   HGBA1C 5.9 02/05/2017    Procedures since last visit: No results found.  Assessment/Plan Facial Rash Looks Dermatitis with Fungal component Will start her on Low dose of Ketoconazole with Hydrocortisone for 2 weeks Reval  If not better Dermatology to see in facility  Itching in the back Will try Triamcinolone cream for few days to use PRN   Labs/tests ordered:  * No order type specified * Next appt:  Visit date not found

## 2019-08-15 ENCOUNTER — Encounter: Payer: Self-pay | Admitting: Internal Medicine

## 2019-08-18 ENCOUNTER — Other Ambulatory Visit: Payer: Self-pay | Admitting: *Deleted

## 2019-08-18 DIAGNOSIS — F411 Generalized anxiety disorder: Secondary | ICD-10-CM

## 2019-08-18 MED ORDER — LORAZEPAM 0.5 MG PO TABS: ORAL_TABLET | ORAL | 0 refills | Status: DC

## 2019-08-18 MED ORDER — CHLORDIAZEPOXIDE HCL 25 MG PO CAPS: ORAL_CAPSULE | ORAL | 0 refills | Status: DC

## 2019-09-09 ENCOUNTER — Other Ambulatory Visit: Payer: Self-pay | Admitting: *Deleted

## 2019-09-09 DIAGNOSIS — G8929 Other chronic pain: Secondary | ICD-10-CM

## 2019-09-09 MED ORDER — HYDROCODONE-ACETAMINOPHEN 5-325 MG PO TABS: 1.0000 | ORAL_TABLET | ORAL | 0 refills | Status: DC

## 2019-09-09 NOTE — Telephone Encounter (Signed)
Received fax from [facility] requesting refill Pended Rx and sent to Dr. Lyndel Safe for approval.

## 2019-09-14 ENCOUNTER — Other Ambulatory Visit: Payer: Self-pay | Admitting: *Deleted

## 2019-09-14 DIAGNOSIS — F411 Generalized anxiety disorder: Secondary | ICD-10-CM

## 2019-09-14 MED ORDER — CHLORDIAZEPOXIDE HCL 25 MG PO CAPS: ORAL_CAPSULE | ORAL | 0 refills | Status: DC

## 2019-09-14 MED ORDER — LORAZEPAM 0.5 MG PO TABS: ORAL_TABLET | ORAL | 0 refills | Status: DC

## 2019-09-14 NOTE — Telephone Encounter (Signed)
Received Refill Request From [facility] Pended Rx and sent to Dr. Lyndel Safe for approval.

## 2019-10-01 ENCOUNTER — Encounter: Payer: Self-pay | Admitting: Internal Medicine

## 2019-10-01 ENCOUNTER — Non-Acute Institutional Stay: Payer: Medicare Other | Admitting: Internal Medicine

## 2019-10-01 DIAGNOSIS — M8000XA Age-related osteoporosis with current pathological fracture, unspecified site, initial encounter for fracture: Secondary | ICD-10-CM | POA: Diagnosis not present

## 2019-10-01 DIAGNOSIS — F418 Other specified anxiety disorders: Secondary | ICD-10-CM

## 2019-10-01 DIAGNOSIS — R21 Rash and other nonspecific skin eruption: Secondary | ICD-10-CM

## 2019-10-01 DIAGNOSIS — E782 Mixed hyperlipidemia: Secondary | ICD-10-CM

## 2019-10-01 DIAGNOSIS — I10 Essential (primary) hypertension: Secondary | ICD-10-CM

## 2019-10-01 NOTE — Progress Notes (Signed)
Location:  [place] Room Number: [id number] Place of Service:  [facility] [phone])  Provider: [name], MD   Code Status: DNR Goals of Care:  Advanced Directives 10/01/2019  Does Patient Have a Medical Advance Directive? Yes  Type of Par[employer] of [place];Living will;Out of facility DNR (pink MOST or yellow form)  Does patient want to make changes to medical advance directive? No - Patient declined  Copy of [place] in Chart? Yes - validated most recent copy scanned in chart (See row information)  Pre-existing out of facility DNR order (yellow form or pink MOST form) Pink MOST/Yellow Form most recent copy in chart - Physician notified to receive inpatient order     Chief Complaint  Patient presents with  . Medical Management of Chronic Issues    Routine Visit     HPI: Patient is a 79 y.o. female seen today for medical management of chronic diseases.   [place] resident of [place]. She has h/o Hypertension, Osteoarthritis, GERD , Constipation , Bilateral Visual Loss and  Anxiety with Depression with Paranoid Behavior  Patient is stable in [place] But recently she is having more Paranoid behavior like Accusing the staff of stealing her stuff. She was telling me also about it. She also had rash on her face which is improved but still there/ I had prescribed her Ketoconazole with Steroid. Patient is upset because insurance did not cover her cost And she thinks everyone is stealing from her. Beside this she is doing well. Did not have any acute complains today Weight stable No New nursing issues    Past Medical History:  Diagnosis Date  . Allergy   . Anemia   . Anxiety   . Asthma   . CAD (coronary artery disease)   . Depression   . Diverticulosis   . Dysfunction of eustachian tube   . Elevated LFTs   . Esophageal reflux   . Esophageal stricture 2002  . Hemorrhoids   . History of rectal polyps   . HLD (hyperlipidemia)    . HTN (hypertension)   . Hypertonicity of bladder   . Irritable bowel syndrome with constipation   . Obesity   . Optic neuritis   . Osteoporosis   . Peptic ulcer disease   . Peripheral neuropathy   . Trigeminal neuralgia    prior injury  . 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 Vitamin D deficiency     Past Surgical History:  Procedure Laterality Date  . ABDOMINAL HYSTERECTOMY    . ANAL RECTAL MANOMETRY N/A 07/05/2014   Procedure: ANO RECTAL MANOMETRY;  Surgeon: Leighton Ruff, MD;  Location: [facility];  Service: Endoscopy;  Laterality: N/A;  . APPENDECTOMY    . CAROTID ENDARTERECTOMY    . COLONOSCOPY  2008, 2015  . TONSILLECTOMY    . UPPER GASTROINTESTINAL ENDOSCOPY  2002    Allergies  Allergen Reactions  . Azithromycin   . Biaxin [Clarithromycin]   . Clarithromycin   . Doxycycline   . Fosamax [Alendronate Sodium]   . Geodon [Ziprasidone Hydrochloride]   . Lipitor [Atorvastatin Calcium]   . Penicillins   . Pravachol [Pravastatin Sodium]   . Statins   . Sulfonamide Derivatives   . Zocor [Simvastatin]     Outpatient Encounter Medications as of 10/01/2019  Medication Sig  . acetaminophen (TYLENOL) 325 MG tablet Take 650 mg by mouth every  8 (eight) hours as needed. 8am & 8pm AS NEEDED FOR PAIN *NOT TO EXCEED 3,000 MG IN 24HRS*  . acetaminophen (TYLENOL) 650 MG CR tablet Take 650 mg by mouth 2 (two) times daily.   Marland Kitchen alum & mag hydroxide-simeth (MYLANTA) 200-200-20 MG/5ML suspension Take 30 mLs by mouth at bedtime. 9pm  . alum & mag hydroxide-simeth (MYLANTA) 200-200-20 MG/5ML suspension Take 30 mLs by mouth every 4 (four) hours as needed for indigestion or heartburn.  Marland Kitchen amitriptyline (ELAVIL) 50 MG tablet Take 50 mg by mouth at bedtime. 9pm  . ARTIFICIAL TEAR OP Apply 1 drop to eye 3 (three) times daily. Both Eyes at 8am, 2pm, and 9pm.  . aspirin 81 MG EC tablet Take 81 mg by mouth daily. 9am  .  betamethasone valerate (VALISONE) 0.1 % cream Apply 1 application topically as needed. Apply  thin amount to vulva twice daily.  . bisacodyl (DULCOLAX) 10 MG suppository Place 10 mg rectally daily as needed for moderate constipation.  . bisacodyl (DULCOLAX) 5 MG EC tablet Take 5 mg by mouth daily as needed for moderate constipation.  . Calcium Carb-Cholecalciferol (OYSTER SHELL CALCIUM/VITAMIN D) 250-125 MG-UNIT TABS Take 1 tablet by mouth 2 (two) times daily. 9am & 5pm  . camphor-menthol (SARNA) lotion Apply topically. 0.5-0.5 % Three times a day as needed to mid back  . carbamide peroxide (DEBROX) 6.5 % OTIC solution Place 5 drops to affected ear every night for 3 days, then irrigate with bulb syringe. If not effective, notify MD.  . chlordiazePOXIDE (LIBRIUM) 25 MG capsule Take 25 mg by mouth 3 (three) times daily as needed for anxiety. Take one capsule by mouth at bedtime at 9pm  . famotidine (PEPCID) 20 MG tablet Take 20 mg by mouth at bedtime. 8pm  . fluPHENAZine (PROLIXIN) 1 MG tablet Take 1 mg by mouth at bedtime. 8pm.  . fluticasone (FLONASE) 50 MCG/ACT nasal spray Place 1 spray into both nostrils 2 (two) times daily. 8am & 8pm.  . furosemide (LASIX) 20 MG tablet Take 20 mg by mouth daily. 8am  . HYDROcodone-acetaminophen (NORCO/VICODIN) 5-325 MG tablet Take 1 tablet by mouth every morning.  . hydrocortisone 2.5 % cream Apply 1 application topically 2 (two) times daily as needed. Apply to affected areas on face.  . hydrocortisone cream 1 % Apply 1 application topically 2 (two) times daily. May self apply twice daily to Hemorrhoids.  . Ketoconazole-Hydrocortisone 2-2.5 % CREA Apply 1 application topically as needed. Apply to face for rash one time daily as needed.  . linaclotide (LINZESS) 290 MCG CAPS capsule Take 290 mcg by mouth daily. Take by mouth 45minutes before breakfast on an empty stomach at 7:30am. Be sure to swallow whole  . loratadine (CLARITIN) 10 MG tablet Take 10 mg by mouth  daily as needed for allergies.  Marland Kitchen LORazepam (ATIVAN) 0.5 MG tablet Take 0.5 mg by mouth 2 (two) times daily. 8am & 9pm  . magnesium hydroxide (MILK OF MAGNESIA) 400 MG/5ML suspension Take 30 mLs by mouth daily as needed.   . mometasone (ELOCON) 0.1 % cream May self administer to face twice daily as needed.  . Multiple Vitamins-Minerals (CENTRUM SILVER PO) Take 1 tablet by mouth. 8am  . polyethylene glycol (MIRALAX / GLYCOLAX) packet Take 17 g by mouth 2 (two) times daily.  Marland Kitchen saccharomyces boulardii (FLORASTOR) 250 MG capsule Take 250 mg by mouth daily. 8am  . spironolactone (ALDACTONE) 25 MG tablet Take 12.5 mg by mouth daily.  Marland Kitchen triamcinolone cream (KENALOG) 0.5 %  Apply 1 application topically 2 (two) times daily. Apply to back for itching in the morning and evening.  . [DISCONTINUED] calcium-vitamin D (OSCAL WITH D) 250-125 MG-UNIT tablet Take 1 tablet by mouth 2 (two) times daily. Reported on 12/27/2015  . [DISCONTINUED] chlordiazePOXIDE (LIBRIUM) 25 MG capsule Take one capsule by mouth at bedtime (Patient taking differently: 25 mg. Take one capsule by mouth at bedtime)  . [DISCONTINUED] LORazepam (ATIVAN) 0.5 MG tablet Take one tablet by mouth twice daily for anxiety  . [DISCONTINUED] polyvinyl alcohol (LIQUIFILM TEARS) 1.4 % ophthalmic solution Place 1 drop into both eyes 3 (three) times daily.   No facility-administered encounter medications on file as of 10/01/2019.    Review of Systems:  Review of Systems  Review of Systems  Constitutional: Negative for activity change, appetite change, chills, diaphoresis, fatigue and fever.  HENT: Negative for mouth sores, postnasal drip, rhinorrhea, sinus pain and sore throat.   Respiratory: Negative for apnea, cough, chest tightness, shortness of breath and wheezing.   Cardiovascular: Negative for chest pain, palpitations and leg swelling.  Gastrointestinal: Negative for abdominal distention, abdominal pain, constipation, diarrhea, nausea and  vomiting.  Genitourinary: Negative for dysuria and frequency.  Musculoskeletal: Negative for arthralgias, joint swelling and myalgias.  Skin: Negative for rash.  Neurological: Negative for dizziness, syncope, weakness, light-headedness and numbness.  Psychiatric/Behavioral: Negative for behavioral problems, confusion and sleep disturbance.     Health Maintenance  Topic Date Due  . TETANUS/TDAP  07/15/2029  . INFLUENZA VACCINE  Completed  . DEXA SCAN  Completed  . PNA vac Low Risk Adult  Completed    Physical Exam: Vitals:   10/01/19 1306  BP: 124/70  Pulse: 68  Resp: 18  Temp: 98.2 F (36.8 C)  SpO2: 96%  Weight: 140 lb (63.5 kg)  Height: 5' 1.5" (1.562 m)   Body mass index is 26.02 kg/m. Physical Exam  Constitutional: . Well-developed and well-nourished.  HENT:  Head: Normocephalic.  Mouth/Throat: Oropharynx is clear and moist.  Eyes: Pupils are equal, round, and reactive to light.  Neck: Neck supple.  Cardiovascular: Normal rate and normal heart sounds.  No murmur heard. Pulmonary/Chest: Effort normal and breath sounds normal. No respiratory distress. No wheezes. She has no rales.  Abdominal: Soft. Bowel sounds are normal. No distension. There is no tenderness. There is no rebound.  Musculoskeletal: No edema.  Lymphadenopathy: none Neurological: No Focal Deficits Walks with the walker Skin: Skin is warm and dry.  Psychiatric: Gets Anxious and has Paranoia  Labs reviewed: Basic Metabolic Panel: Recent Labs    11/21/18 0000 06/25/19 0000  NA 141 141  K 4.3 4.2  CL  --  105  CO2  --  26  BUN 23* 16  CREATININE 1.1 1.0  CALCIUM  --  9.5  TSH 2.52  --    Liver Function Tests: Recent Labs    11/21/18 0000  AST 26  ALT 21  ALKPHOS 103   No results for input(s): LIPASE, AMYLASE in the last 8760 hours. No results for input(s): AMMONIA in the last 8760 hours. CBC: Recent Labs    11/21/18 0000  WBC 6.5  HGB 13.4  HCT 39  PLT 329   Lipid  Panel: Recent Labs    11/21/18 0000  CHOL 247*  HDL 72*  LDLCALC 147  TRIG 150   Lab Results  Component Value Date   HGBA1C 5.9 02/05/2017    Procedures since last visit: No results found.  Assessment/Plan Rash of face  Redness resolved but does have a small patch of Rough Skin She wants to see In Home Dermatology Talked to the Nurses   Essential hypertension  Depression with anxiety Follows with Psych On Librium and Prolixin Age-related osteoporosis with current pathological fracture, initial encounter Was on Prolia before Tscore was -3.6 in 2018 Was discontinued after 3 doses. Not sure why I have told Nurses to d/w her son. Patient was not aware It seems it was related to her Deductible for the Meds  Mixed hyperlipidemia Has allergies listed to Statin Will d/w the son about this     Labs/tests ordered:  * No order type specified * Next appt:  Visit date not found

## 2019-10-12 ENCOUNTER — Other Ambulatory Visit: Payer: Self-pay | Admitting: Nurse Practitioner

## 2019-10-12 DIAGNOSIS — G8929 Other chronic pain: Secondary | ICD-10-CM

## 2019-10-12 MED ORDER — HYDROCODONE-ACETAMINOPHEN 5-325 MG PO TABS: 1.0000 | ORAL_TABLET | ORAL | 0 refills | Status: DC

## 2019-10-15 ENCOUNTER — Other Ambulatory Visit: Payer: Self-pay | Admitting: *Deleted

## 2019-10-15 MED ORDER — LORAZEPAM 0.5 MG PO TABS: 0.5000 mg | ORAL_TABLET | Freq: Two times a day (BID) | ORAL | 0 refills | Status: DC

## 2019-10-15 NOTE — Telephone Encounter (Signed)
Received fax from [facility] Pended [facility] and sent to Dr. Gupta for approval.  

## 2019-10-16 ENCOUNTER — Other Ambulatory Visit: Payer: Self-pay | Admitting: *Deleted

## 2019-10-16 MED ORDER — CHLORDIAZEPOXIDE HCL 25 MG PO CAPS: ORAL_CAPSULE | ORAL | 0 refills | Status: DC

## 2019-10-16 NOTE — Telephone Encounter (Signed)
Received fax from [facility] Pended [facility] and sent to Dr. Gupta for approval.  

## 2019-11-04 ENCOUNTER — Other Ambulatory Visit: Payer: Self-pay | Admitting: *Deleted

## 2019-11-04 DIAGNOSIS — M25562 Pain in left knee: Secondary | ICD-10-CM

## 2019-11-04 DIAGNOSIS — G8929 Other chronic pain: Secondary | ICD-10-CM

## 2019-11-04 MED ORDER — HYDROCODONE-ACETAMINOPHEN 5-325 MG PO TABS: 1.0000 | ORAL_TABLET | ORAL | 0 refills | Status: DC

## 2019-11-04 NOTE — Telephone Encounter (Signed)
Received refill Request from [facility] and sent to [facility] for approval.

## 2019-11-12 ENCOUNTER — Other Ambulatory Visit: Payer: Self-pay | Admitting: *Deleted

## 2019-11-12 MED ORDER — LORAZEPAM 0.5 MG PO TABS: 0.5000 mg | ORAL_TABLET | Freq: Two times a day (BID) | ORAL | 0 refills | Status: DC

## 2019-11-12 NOTE — Telephone Encounter (Signed)
Received refill Request from [facility] Pended [facility] and sent to Dr. Gupta for approval.  

## 2019-11-13 ENCOUNTER — Other Ambulatory Visit: Payer: Self-pay | Admitting: *Deleted

## 2019-11-13 MED ORDER — CHLORDIAZEPOXIDE HCL 25 MG PO CAPS: ORAL_CAPSULE | ORAL | 0 refills | Status: DC

## 2019-11-13 NOTE — Telephone Encounter (Signed)
Received fax from [facility] Pended [facility] and sent to Dr. Gupta for approval.  

## 2019-11-16 ENCOUNTER — Encounter: Payer: Self-pay | Admitting: Nurse Practitioner

## 2019-11-16 ENCOUNTER — Non-Acute Institutional Stay: Payer: Medicare Other | Admitting: Nurse Practitioner

## 2019-11-16 DIAGNOSIS — K5909 Other constipation: Secondary | ICD-10-CM | POA: Diagnosis not present

## 2019-11-16 DIAGNOSIS — R42 Dizziness and giddiness: Secondary | ICD-10-CM | POA: Diagnosis not present

## 2019-11-16 DIAGNOSIS — M8000XA Age-related osteoporosis with current pathological fracture, unspecified site, initial encounter for fracture: Secondary | ICD-10-CM

## 2019-11-16 DIAGNOSIS — K219 Gastro-esophageal reflux disease without esophagitis: Secondary | ICD-10-CM

## 2019-11-16 DIAGNOSIS — M549 Dorsalgia, unspecified: Secondary | ICD-10-CM

## 2019-11-16 DIAGNOSIS — M159 Polyosteoarthritis, unspecified: Secondary | ICD-10-CM

## 2019-11-16 DIAGNOSIS — F418 Other specified anxiety disorders: Secondary | ICD-10-CM

## 2019-11-16 DIAGNOSIS — I739 Peripheral vascular disease, unspecified: Secondary | ICD-10-CM

## 2019-11-16 DIAGNOSIS — J3089 Other allergic rhinitis: Secondary | ICD-10-CM

## 2019-11-16 DIAGNOSIS — I10 Essential (primary) hypertension: Secondary | ICD-10-CM

## 2019-11-16 DIAGNOSIS — J302 Other seasonal allergic rhinitis: Secondary | ICD-10-CM

## 2019-11-16 LAB — HEPATIC FUNCTION PANEL
ALT: 18 (ref 7–35)
AST: 25 (ref 13–35)
Alkaline Phosphatase: 93 (ref 25–125)
Bilirubin, Total: 0.4

## 2019-11-16 LAB — CBC: RBC: 4.11 (ref 3.87–5.11)

## 2019-11-16 LAB — COMPREHENSIVE METABOLIC PANEL
Albumin: 4.2 (ref 3.5–5.0)
Calcium: 9.4 (ref 8.7–10.7)
Globulin: 2.8

## 2019-11-16 LAB — BASIC METABOLIC PANEL
BUN: 19 (ref 4–21)
CO2: 26 — AB (ref 13–22)
Chloride: 102 (ref 99–108)
Creatinine: 1.2 — AB (ref 0.5–1.1)
Glucose: 111
Potassium: 4.8 (ref 3.4–5.3)
Sodium: 138 (ref 137–147)

## 2019-11-16 LAB — CBC AND DIFFERENTIAL
HCT: 38 (ref 36–46)
Hemoglobin: 12.8 (ref 12.0–16.0)
Neutrophils Absolute: 3973
Platelets: 324 (ref 150–399)
WBC: 7.7

## 2019-11-16 NOTE — Assessment & Plan Note (Signed)
Stable, continue Norco, Tylenol.

## 2019-11-16 NOTE — Assessment & Plan Note (Signed)
C/o right back pain from the lower end of the scapula, CVA, down to the lower back since this morning, denied injury, cough, SOB, dysuria, urinary frequency, urgency, but she admitted constipation-no having BM today, small yesterday.  Will obtain CXR UA C/S, CBC/diff, CMP/eGFR to evaluation further.

## 2019-11-16 NOTE — Assessment & Plan Note (Signed)
C/o right back pain from the lower end of the scapula, CVA, down to the lower back since this morning, denied injury, cough, SOB, dysuria, urinary frequency, urgency, but she admitted constipation-no having BM today, small yesterday. Will MiraLax 17gm +4oz fluid stat. Continue prn Bisacodyl tab, suppository, prn MOM,  MiraLax bid, Linzess qd.

## 2019-11-16 NOTE — Assessment & Plan Note (Signed)
Controlled blood pressure.  

## 2019-11-16 NOTE — Assessment & Plan Note (Signed)
Her mood is stable, continue Lorazepam 0.5mg  bid, Prolixin 1mg  qd, Amitriptyline 50mg  qd, Librium 25mg  qd.

## 2019-11-16 NOTE — Assessment & Plan Note (Signed)
Stable, continue Famotidine.  

## 2019-11-16 NOTE — Assessment & Plan Note (Signed)
dizziness, vomit after am coffee, c/o stuffy nose, resolved after lying down and Claritin. C/o right back pain from the lower end of the scapula, CVA, down to the lower back since this morning, denied injury, cough, SOB, dysuria, urinary frequency, urgency, but she admitted constipation-no having BM today, small yesterday. Will stat MiraLax x1, CBC/diff, CMP/eGFR, UA C/S, CXR ap/lateral.

## 2019-11-16 NOTE — Assessment & Plan Note (Signed)
Trace edema BLE, continue Furosemide, Spironolactone.

## 2019-11-16 NOTE — Progress Notes (Signed)
Location:   [place] Room Number: [place] of Service:  ALF ([phone]) Provider:  Donisha Hoch NP  [name], MD  Patient Care Team: [name], MD as PCP - General (Internal Medicine) [name], MD (Ophthalmology) [name], MD (Psychiatry) Calyse Murcia X, NP as Nurse Practitioner (Nurse Practitioner) [name], MD as Consulting Physician (Orthopedic Surgery)  Extended Emergency Contact Information Primary Emergency Contact: [name], [place] Work Phone: [phone] Relation: Son Secondary Emergency Contact: [name] [place] Phone: [phone] Relation: Sister  Code Status:  DNR Goals of care: Advanced Directive information Advanced Directives 11/16/2019  Does Patient Have a Medical Advance Directive? Yes  Type of Advance Directive Out of facility DNR (pink MOST or yellow form);Living will;Healthcare Power of Attorney  Does patient want to make changes to medical advance directive? No - Patient declined  Copy of [place] in Chart? Yes - validated most recent copy scanned in chart (See row information)  Pre-existing out of facility DNR order (yellow form or pink MOST form) Yellow form placed in chart (order not valid for inpatient use)     Chief Complaint  Patient presents with  . Acute Visit    Dizziness, Vomit    HPI:  Pt is a 79 y.o. female seen today for an acute visit for reported 11/15/19 c/o dizziness, vomit after am coffee, c/o stuffy nose, resolved after lying down and Claritin. 2/22/21C/o right back pain from the lower end of the scapula, CVA, down to the lower back since this morning, denied injury, cough, SOB, dysuria, urinary frequency, urgency, but she admitted constipation-no having BM today, small yesterday.   Hx of constipation, on prn Bisacodyl tab, suppository, prn MOM,  MiraLax bid, Linzess qd. Allergic rhinitis, on  Flonase bid, prn Claritin. Chronic edema, trace, on Spironolactone 12.23m qd, Furosemide 226mqd. Her mood is stable, on Lorazepam 0.68m48mid, Prolixin 1mg58m, Amitriptyline 50mg68m Librium 268mg 64mGERD, stable, on Famotidine 20mg q1mA, knee pain, stable, Norco qd, Tylenol bid.    Past Medical History:  Diagnosis Date  . Allergy   . Anemia   . Anxiety   . Asthma   . CAD (coronary artery disease)   . Depression   . Diverticulosis   . Dysfunction of eustachian tube   . Elevated LFTs   . Esophageal reflux   . Esophageal stricture 2002  . Hemorrhoids   . History of rectal polyps   . HLD (hyperlipidemia)   . HTN (hypertension)   . Hypertonicity of bladder   . Irritable bowel syndrome with constipation   . Obesity   . Optic neuritis   . Osteoporosis   . Peptic ulcer disease   . Peripheral neuropathy   . Trigeminal neuralgia    prior injury  . 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 Vitamin D deficiency    Past Surgical History:  Procedure Laterality Date  . ABDOMINAL HYSTERECTOMY    . ANAL RECTAL MANOMETRY N/A 07/05/2014   Procedure: ANO RECTAL MANOMETRY;  Surgeon: Alicia Leighton RuffLocation: [facility];  Service: Endoscopy;  Laterality: N/A;  . APPENDECTOMY    . CAROTID ENDARTERECTOMY    . COLONOSCOPY  2008, 2015  . TONSILLECTOMY    . UPPER GASTROINTESTINAL ENDOSCOPY  2002    Allergies  Allergen  Reactions  . Azithromycin   . Biaxin [Clarithromycin]   . Clarithromycin   . Doxycycline   . Fosamax [Alendronate Sodium]   . Geodon [Ziprasidone Hydrochloride]   . Lipitor [Atorvastatin Calcium]   . Penicillins   . Pravachol [Pravastatin Sodium]   . Statins   . Sulfonamide Derivatives   . Zocor [Simvastatin]     Allergies as of 11/16/2019      Reactions   Azithromycin    Biaxin [clarithromycin]    Clarithromycin    Doxycycline    Fosamax [alendronate Sodium]    Geodon [ziprasidone Hydrochloride]      Lipitor [atorvastatin Calcium]    Penicillins    Pravachol [pravastatin Sodium]    Statins    Sulfonamide Derivatives    Zocor [simvastatin]       Medication List       Accurate as of November 16, 2019  2:50 PM. If you have any questions, ask your nurse or doctor.        acetaminophen 650 MG CR tablet Commonly known as: TYLENOL Take 650 mg by mouth 2 (two) times daily.   acetaminophen 325 MG tablet Commonly known as: TYLENOL Take 650 mg by mouth every 8 (eight) hours as needed. 8am & 8pm AS NEEDED FOR PAIN *NOT TO EXCEED 3,000 MG IN 24HRS*   amitriptyline 50 MG tablet Commonly known as: ELAVIL Take 50 mg by mouth at bedtime. 9pm   ARTIFICIAL TEAR OP Apply 1 drop to eye 3 (three) times daily. Both Eyes at 8am, 2pm, and 9pm.   aspirin 81 MG EC tablet Take 81 mg by mouth daily. 9am   betamethasone valerate 0.1 % cream Commonly known as: VALISONE Apply 1 application topically as needed. Apply  thin amount to vulva twice daily.   bisacodyl 10 MG suppository Commonly known as: DULCOLAX Place 10 mg rectally daily as needed for moderate constipation.   bisacodyl 5 MG EC tablet Commonly known as: DULCOLAX Take 5 mg by mouth daily as needed for moderate constipation.   camphor-menthol lotion Commonly known as: SARNA Apply topically. 0.5-0.5 % Three times a day as needed to mid back   carbamide peroxide 6.5 % OTIC solution Commonly known as: DEBROX Place 5 drops to affected ear every night for 3 days, then irrigate with bulb syringe. If not effective, notify MD.   CENTRUM SILVER PO Take 1 tablet by mouth. 8am   chlordiazePOXIDE 25 MG capsule Commonly known as: LIBRIUM Take one capsule by mouth at bedtime   famotidine 20 MG tablet Commonly known as: PEPCID Take 20 mg by mouth at bedtime. 8pm   fluPHENAZine 1 MG tablet Commonly known as: PROLIXIN Take 1 mg by mouth at bedtime. 8pm.   fluticasone 50 MCG/ACT nasal spray Commonly known as: FLONASE Place 1  spray into both nostrils 2 (two) times daily. 8am & 8pm.   furosemide 20 MG tablet Commonly known as: LASIX Take 20 mg by mouth daily. 8am   HYDROcodone-acetaminophen 5-325 MG tablet Commonly known as: NORCO/VICODIN Take 1 tablet by mouth every morning.   hydrocortisone 2.5 % cream Apply 1 application topically 2 (two) times daily as needed. Apply to affected areas on face.   hydrocortisone cream 1 % Apply 1 application topically 2 (two) times daily. May self apply twice daily to Hemorrhoids.   Ketoconazole-Hydrocortisone 2-2.5 % Crea Apply 1 application topically as needed. Apply to face for rash one time daily as needed.   Linzess 290 MCG Caps capsule Generic drug: linaclotide Take 290  mcg by mouth daily. Take by mouth 18mnutes before breakfast on an empty stomach at 7:30am. Be sure to swallow whole   loratadine 10 MG tablet Commonly known as: CLARITIN Take 10 mg by mouth daily as needed for allergies.   LORazepam 0.5 MG tablet Commonly known as: ATIVAN Take 1 tablet (0.5 mg total) by mouth 2 (two) times daily.   magnesium hydroxide 400 MG/5ML suspension Commonly known as: MILK OF MAGNESIA Take 30 mLs by mouth daily as needed.   mometasone 0.1 % cream Commonly known as: ELOCON May self administer to face twice daily as needed.   Mylanta 200-200-20 MG/5ML suspension Generic drug: alum & mag hydroxide-simeth Take 30 mLs by mouth at bedtime. 9pm   Mylanta 200-200-20 MG/5ML suspension Generic drug: alum & mag hydroxide-simeth Take 30 mLs by mouth every 4 (four) hours as needed for indigestion or heartburn.   Oyster Shell Calcium/Vitamin D 250-125 MG-UNIT Tabs Take 1 tablet by mouth 2 (two) times daily. 9am & 5pm   polyethylene glycol 17 g packet Commonly known as: MIRALAX / GLYCOLAX Take 17 g by mouth 2 (two) times daily.   saccharomyces boulardii 250 MG capsule Commonly known as: FLORASTOR Take 250 mg by mouth daily. 8am   spironolactone 25 MG  tablet Commonly known as: ALDACTONE Take 12.5 mg by mouth daily.   triamcinolone cream 0.5 % Commonly known as: KENALOG Apply 1 application topically 2 (two) times daily. Apply to back for itching in the morning and evening.       Review of Systems  Constitutional: Negative for activity change, appetite change, chills, diaphoresis, fatigue, fever and unexpected weight change.  HENT: Positive for hearing loss. Negative for congestion and voice change.   Eyes: Positive for visual disturbance.       Low vision  Respiratory: Negative for cough, shortness of breath and wheezing.   Cardiovascular: Positive for leg swelling. Negative for chest pain and palpitations.  Gastrointestinal: Positive for vomiting. Negative for abdominal distention, abdominal pain, constipation, diarrhea and nausea.       Resolved yesterday  Genitourinary: Negative for difficulty urinating, dysuria and urgency.  Musculoskeletal: Positive for arthralgias, back pain and gait problem.       Right back pain  Skin: Negative for color change and pallor.  Neurological: Positive for dizziness. Negative for speech difficulty, weakness and headaches.       Memory lapses. Resolved yesterday after lying down. The patient stated is was related to her vomiting after coffee.   Psychiatric/Behavioral: Negative for agitation, behavioral problems, hallucinations and sleep disturbance. The patient is not nervous/anxious.     Immunization History  Administered Date(s) Administered  . Influenza Split 07/23/2013  . Influenza Whole 07/05/2009, 06/26/2018  . Influenza-Unspecified 07/29/2014, 06/14/2015, 07/11/2016, 07/15/2017, 06/27/2019  . Moderna SARS-COVID-2 Vaccination 09/26/2019, 10/24/2019  . PFIZER SARS-COV-2 Vaccination 09/26/2019  . PPD Test 06/17/2013  . Pneumococcal Conjugate-13 07/22/2017  . Pneumococcal Polysaccharide-23 05/05/2009  . Td 05/05/2009, 07/16/2019  . Zoster 01/09/2011   Pertinent  Health Maintenance Due   Topic Date Due  . INFLUENZA VACCINE  Completed  . DEXA SCAN  Completed  . PNA vac Low Risk Adult  Completed   Fall Risk  06/10/2018 06/04/2017 07/29/2014  Falls in the past year? No Yes No  Number falls in past yr: - 1 -  Injury with Fall? - No -   Functional Status Survey:    Vitals:   11/16/19 1055  BP: 118/60  Pulse: (!) 58  Resp: 18  Temp: (Marland Kitchen  97.1 F (36.2 C)  SpO2: 94%  Weight: 136 lb 12.8 oz (62.1 kg)  Height: 5' 1.5" (1.562 m)   Body mass index is 25.43 kg/m. Physical Exam Constitutional:      General: She is not in acute distress.    Appearance: Normal appearance. She is not ill-appearing, toxic-appearing or diaphoretic.     Comments: Over weight  HENT:     Head: Normocephalic.     Nose: Nose normal.     Mouth/Throat:     Mouth: Mucous membranes are moist.  Eyes:     Extraocular Movements: Extraocular movements intact.     Conjunctiva/sclera: Conjunctivae normal.     Pupils: Pupils are equal, round, and reactive to light.     Comments: Legally blind  Cardiovascular:     Rate and Rhythm: Normal rate and regular rhythm.     Heart sounds: No murmur.  Pulmonary:     Breath sounds: No wheezing, rhonchi or rales.  Abdominal:     General: Bowel sounds are normal. There is no distension.     Palpations: Abdomen is soft.     Tenderness: There is no abdominal tenderness. There is no right CVA tenderness, left CVA tenderness, guarding or rebound.  Musculoskeletal:        General: Tenderness present.     Cervical back: Normal range of motion and neck supple.     Right lower leg: Edema present.     Left lower leg: Edema present.     Comments: Trace edema BLE. C/o pain from the lower end of the right scapula to the lower back palpated.   Skin:    General: Skin is warm and dry.  Neurological:     General: No focal deficit present.     Mental Status: She is alert. Mental status is at baseline.     Cranial Nerves: No cranial nerve deficit.     Motor: No weakness.      Coordination: Coordination normal.     Gait: Gait abnormal.     Comments: Oriented to person, place.   Psychiatric:        Mood and Affect: Mood normal.        Behavior: Behavior normal.        Thought Content: Thought content normal.        Judgment: Judgment normal.     Labs reviewed: Recent Labs    11/21/18 0000 06/25/19 0000  NA 141 141  K 4.3 4.2  CL  --  105  CO2  --  26  BUN 23* 16  CREATININE 1.1 1.0  CALCIUM  --  9.5   Recent Labs    11/21/18 0000  AST 26  ALT 21  ALKPHOS 103   Recent Labs    11/21/18 0000  WBC 6.5  HGB 13.4  HCT 39  PLT 329   Lab Results  Component Value Date   TSH 2.52 11/21/2018   Lab Results  Component Value Date   HGBA1C 5.9 02/05/2017   Lab Results  Component Value Date   CHOL 247 (A) 11/21/2018   HDL 72 (A) 11/21/2018   LDLCALC 147 11/21/2018   LDLDIRECT 190.0 03/24/2012   TRIG 150 11/21/2018   CHOLHDL 4 03/24/2012    Significant Diagnostic Results in last 30 days:  No results found.  Assessment/Plan Dizziness dizziness, vomit after am coffee, c/o stuffy nose, resolved after lying down and Claritin. C/o right back pain from the lower end of the scapula, CVA, down  to the lower back since this morning, denied injury, cough, SOB, dysuria, urinary frequency, urgency, but she admitted constipation-no having BM today, small yesterday. Will stat MiraLax x1, CBC/diff, CMP/eGFR, UA C/S, CXR ap/lateral.    Right-sided back pain C/o right back pain from the lower end of the scapula, CVA, down to the lower back since this morning, denied injury, cough, SOB, dysuria, urinary frequency, urgency, but she admitted constipation-no having BM today, small yesterday.  Will obtain CXR UA C/S, CBC/diff, CMP/eGFR to evaluation further.   Constipation C/o right back pain from the lower end of the scapula, CVA, down to the lower back since this morning, denied injury, cough, SOB, dysuria, urinary frequency, urgency, but she admitted  constipation-no having BM today, small yesterday. Will MiraLax 17gm +4oz fluid stat. Continue prn Bisacodyl tab, suppository, prn MOM,  MiraLax bid, Linzess qd.   Seasonal and perennial allergic rhinitis Continue Flonase bid, prn Claritin.  Essential hypertension Controlled blood pressure  PVD (peripheral vascular disease) (HCC) Trace edema BLE, continue Furosemide, Spironolactone.   GERD (gastroesophageal reflux disease) Stable, continue Famotidine.   Depression with anxiety Her mood is stable, continue Lorazepam 0.55m bid, Prolixin 141mqd, Amitriptyline 5070md, Librium 56m30m.  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      Family/ staff Communication: plan of care reviewed with the patient and charge nurse.   Labs/tests ordered:  CBC/diff, CMP/eGFR, CXR, UA C/S  Time spend 40 minutes.

## 2019-11-16 NOTE — Assessment & Plan Note (Signed)
Continue Flonase bid, prn Claritin.

## 2019-11-23 ENCOUNTER — Non-Acute Institutional Stay: Payer: Medicare Other | Admitting: Nurse Practitioner

## 2019-11-23 ENCOUNTER — Encounter: Payer: Self-pay | Admitting: Nurse Practitioner

## 2019-11-23 DIAGNOSIS — F418 Other specified anxiety disorders: Secondary | ICD-10-CM

## 2019-11-23 DIAGNOSIS — K5909 Other constipation: Secondary | ICD-10-CM

## 2019-11-23 DIAGNOSIS — M159 Polyosteoarthritis, unspecified: Secondary | ICD-10-CM

## 2019-11-23 NOTE — Assessment & Plan Note (Signed)
Pain is mostly in knees, continue Tylenol, Norco.

## 2019-11-23 NOTE — Assessment & Plan Note (Signed)
mood is managed, continue  Lorazepam 0.5mg  bid, Prolixin 1mg  qd, Librium 25mg  qd, Amitriptyline 50mg  qd.

## 2019-11-23 NOTE — Assessment & Plan Note (Signed)
11/23/19 desires to increase MiraLax to 2 capfuls bid, mix with 8 Oz fluids, update CBC/diff, CMP/eGFR. Continue Linzess, prn MOM, Dulcolax.

## 2019-11-23 NOTE — Progress Notes (Signed)
Location:   [place] Room Number: [place] of Service:  ALF [phone] Provider:  Ryland Smoots NP  [name], MD  Patient Care Team: [name], MD as PCP - General (Internal Medicine) [name], MD (Ophthalmology) [name], MD (Psychiatry) Patra Gherardi X, NP as Nurse Practitioner (Nurse Practitioner) [name], MD as Consulting Physician (Orthopedic Surgery)  Extended Emergency Contact Information Primary Emergency Contact: [name], [place] Work Phone: [phone] Relation: Son Secondary Emergency Contact: [name] [place] Phone: [phone] Relation: Sister  Code Status:  DNR Goals of care: Advanced Directive information Advanced Directives 11/23/2019  Does Patient Have a Medical Advance Directive? Yes  Type of Paramedic of [place];Living will;Out of facility DNR (pink MOST or yellow form)  Does patient want to make changes to medical advance directive? No - Patient declined  Copy of [place] in Chart? Yes - validated most recent copy scanned in chart (See row information)  Pre-existing out of facility DNR order (yellow form or pink MOST form) Yellow form placed in chart (order not valid for inpatient use)     Chief Complaint  Patient presents with  . Acute Visit    Constipation    HPI:  Pt is a 79 y.o. female seen today for an acute visit for constipation, stated she has not had BM for a few days, she denied nausea, vomiting, abd pain, or feeling bloated. The patient has Hx of constipation, stable on LInzess, prn MOM, Dulcolax tab, Dulcolax suppository, MiraLax bid until now. The patient stated one capful of MiraLax is no longer effective since the capful is different from prior. The patient desires to take 2 capfuls MiraLax for trial. Hx of depression/anxiety, mood is managed on Lorazepam 0.78m bid, Prolixin  137mqd, Librium 2575md, Amitriptyline 19m80m. 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 Allergy   . Anemia   . Anxiety   . Asthma   . CAD (coronary artery disease)   . Depression   . Diverticulosis   . Dysfunction of eustachian tube   . Elevated LFTs   . Esophageal reflux   . Esophageal stricture 2002  . Hemorrhoids   . History of rectal polyps   . HLD (hyperlipidemia)   . HTN (hypertension)   . Hypertonicity of bladder   . Irritable bowel syndrome with constipation   . Obesity   . Optic neuritis   . Osteoporosis   . Peptic ulcer disease   . Peripheral neuropathy   . Trigeminal neuralgia    prior injury  . 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 Vitamin D deficiency    Past Surgical History:  Procedure Laterality Date  . ABDOMINAL HYSTERECTOMY    . ANAL RECTAL MANOMETRY N/A 07/05/2014   Procedure: ANO RECTAL MANOMETRY;  Surgeon: AliciaLeighton Ruff Location: [facility];  Service: Endoscopy;  Laterality: N/A;  . APPENDECTOMY    . CAROTID ENDARTERECTOMY    . COLONOSCOPY  2008, 2015  . TONSILLECTOMY    . UPPER GASTROINTESTINAL ENDOSCOPY  2002    Allergies  Allergen Reactions  . Azithromycin   . Biaxin [Clarithromycin]   . Clarithromycin   . Doxycycline   . Fosamax [Alendronate  Sodium]   . Geodon [Ziprasidone Hydrochloride]   . Lipitor [Atorvastatin Calcium]   . Penicillins   . Pravachol [Pravastatin Sodium]   . Statins   . Sulfonamide Derivatives   . Zocor [Simvastatin]     Allergies as of 11/23/2019      Reactions   Azithromycin    Biaxin [clarithromycin]    Clarithromycin    Doxycycline    Fosamax [alendronate Sodium]    Geodon [ziprasidone Hydrochloride]    Lipitor [atorvastatin Calcium]    Penicillins    Pravachol [pravastatin Sodium]    Statins    Sulfonamide Derivatives    Zocor [simvastatin]       Medication List        Accurate as of November 23, 2019  5:06 PM. If you have any questions, ask your nurse or doctor.        STOP taking these medications   polyethylene glycol 17 g packet Commonly known as: MIRALAX / GLYCOLAX Stopped by: Jonna Dittrich X Talayeh Bruinsma, NP     TAKE these medications   acetaminophen 650 MG CR tablet Commonly known as: TYLENOL Take 650 mg by mouth 2 (two) times daily.   acetaminophen 325 MG tablet Commonly known as: TYLENOL Take 650 mg by mouth every 8 (eight) hours as needed. 8am & 8pm AS NEEDED FOR PAIN *NOT TO EXCEED 3,000 MG IN 24HRS*   amitriptyline 50 MG tablet Commonly known as: ELAVIL Take 50 mg by mouth at bedtime. 9pm   ARTIFICIAL TEAR OP Apply 1 drop to eye 3 (three) times daily. Both Eyes at 8am, 2pm, and 9pm.   aspirin 81 MG EC tablet Take 81 mg by mouth daily. 9am   betamethasone valerate 0.1 % cream Commonly known as: VALISONE Apply 1 application topically as needed. Apply  thin amount to vulva twice daily.   bisacodyl 10 MG suppository Commonly known as: DULCOLAX Place 10 mg rectally daily as needed for moderate constipation.   bisacodyl 5 MG EC tablet Commonly known as: DULCOLAX Take 5 mg by mouth daily as needed for moderate constipation.   camphor-menthol lotion Commonly known as: SARNA Apply topically. 0.5-0.5 % Three times a day as needed to mid back   carbamide peroxide 6.5 % OTIC solution Commonly known as: DEBROX Place 5 drops to affected ear every night for 3 days, then irrigate with bulb syringe. If not effective, notify MD.   CENTRUM SILVER PO Take 1 tablet by mouth. 8am   chlordiazePOXIDE 25 MG capsule Commonly known as: LIBRIUM Take one capsule by mouth at bedtime   famotidine 20 MG tablet Commonly known as: PEPCID Take 20 mg by mouth at bedtime. 8pm   fluPHENAZine 1 MG tablet Commonly known as: PROLIXIN Take 1 mg by mouth at bedtime. 8pm.   fluticasone 50 MCG/ACT nasal spray Commonly known as: FLONASE Place 1 spray into both  nostrils 2 (two) times daily. 8am & 8pm.   furosemide 20 MG tablet Commonly known as: LASIX Take 20 mg by mouth daily. 8am   HYDROcodone-acetaminophen 5-325 MG tablet Commonly known as: NORCO/VICODIN Take 1 tablet by mouth every morning.   hydrocortisone 2.5 % cream Apply 1 application topically 2 (two) times daily as needed. Apply to affected areas on face.   hydrocortisone cream 1 % Apply 1 application topically 2 (two) times daily. May self apply twice daily to Hemorrhoids.   Ketoconazole-Hydrocortisone 2-2.5 % Crea Apply 1 application topically as needed. Apply to face for rash one time daily as needed.   Linzess  [place] capsule Generic drug: linaclotide Take 290 mcg by mouth daily. Take by mouth 68mnutes before breakfast on an empty stomach at 7:30am. Be sure to swallow whole   loratadine 10 MG tablet Commonly known as: CLARITIN Take 10 mg by mouth daily as needed for allergies.   LORazepam 0.5 MG tablet Commonly known as: ATIVAN Take 1 tablet (0.5 mg total) by mouth 2 (two) times daily.   magnesium hydroxide 400 MG/5ML suspension Commonly known as: MILK OF MAGNESIA Take 30 mLs by mouth daily as needed.   mometasone 0.1 % cream Commonly known as: ELOCON May self administer to face twice daily as needed.   Mylanta 200-200-20 MG/5ML suspension Generic drug: alum & mag hydroxide-simeth Take 30 mLs by mouth at bedtime. 9pm   Mylanta 200-200-20 MG/5ML suspension Generic drug: alum & mag hydroxide-simeth Take 30 mLs by mouth every 4 (four) hours as needed for indigestion or heartburn.   Oyster Shell Calcium/Vitamin D 250-125 MG-UNIT Tabs Take 1 tablet by mouth 2 (two) times daily. 9am & 5pm   saccharomyces boulardii 250 MG capsule Commonly known as: FLORASTOR Take 250 mg by mouth daily. 8am   spironolactone 25 MG tablet Commonly known as: ALDACTONE Take 12.5 mg by mouth daily.   triamcinolone cream 0.5 % Commonly known as: KENALOG Apply 1 application  topically 2 (two) times daily. Apply to back for itching in the morning and evening.      ROS was provided with assistance of staff.  Review of Systems  Constitutional: Negative for activity change, appetite change, chills, diaphoresis, fatigue and fever.  HENT: Positive for hearing loss. Negative for congestion and voice change.   Eyes: Positive for visual disturbance.       Low vision  Respiratory: Negative for cough, shortness of breath and wheezing.   Cardiovascular: Positive for leg swelling. Negative for chest pain and palpitations.  Gastrointestinal: Positive for constipation. Negative for abdominal distention, abdominal pain, diarrhea, nausea and vomiting.  Genitourinary: Negative for difficulty urinating, dysuria and urgency.  Musculoskeletal: Positive for arthralgias and gait problem. Negative for back pain.  Skin: Negative for color change and pallor.  Neurological: Negative for speech difficulty, weakness and headaches.       Memory lapses  Psychiatric/Behavioral: Negative for agitation, behavioral problems, hallucinations and sleep disturbance. The patient is nervous/anxious.     Immunization History  Administered Date(s) Administered  . Influenza Split 07/23/2013  . Influenza Whole 07/05/2009, 06/26/2018  . Influenza-Unspecified 07/29/2014, 06/14/2015, 07/11/2016, 07/15/2017, 06/27/2019  . Moderna SARS-COVID-2 Vaccination 09/26/2019, 10/24/2019  . PFIZER SARS-COV-2 Vaccination 09/26/2019  . PPD Test 06/17/2013  . Pneumococcal Conjugate-13 07/22/2017  . Pneumococcal Polysaccharide-23 05/05/2009  . Td 05/05/2009, 07/16/2019  . Zoster 01/09/2011   Pertinent  Health Maintenance Due  Topic Date Due  . INFLUENZA VACCINE  Completed  . DEXA SCAN  Completed  . PNA vac Low Risk Adult  Completed   Fall Risk  06/10/2018 06/04/2017 07/29/2014  Falls in the past year? No Yes No  Number falls in past yr: - 1 -  Injury with Fall? - No -   Functional Status Survey:     Vitals:   11/23/19 1327  BP: 128/60  Pulse: 64  Resp: 18  Temp: (!) 97.1 F (36.2 C)  SpO2: 98%  Weight: 136 lb 9.6 oz (62 kg)  Height: 5' 6"  (1.676 m)   Body mass index is 22.05 kg/m. Physical Exam Constitutional:      General: She is not in acute distress.  Appearance: Normal appearance. She is not ill-appearing.     Comments: Over weight  HENT:     Head: Normocephalic.     Nose: Nose normal.     Mouth/Throat:     Mouth: Mucous membranes are moist.  Eyes:     Extraocular Movements: Extraocular movements intact.     Conjunctiva/sclera: Conjunctivae normal.     Pupils: Pupils are equal, round, and reactive to light.     Comments: Legally blind  Cardiovascular:     Rate and Rhythm: Normal rate and regular rhythm.     Heart sounds: No murmur.  Pulmonary:     Breath sounds: No wheezing, rhonchi or rales.  Abdominal:     General: Bowel sounds are normal. There is no distension.     Palpations: Abdomen is soft.     Tenderness: There is no abdominal tenderness. There is no right CVA tenderness, left CVA tenderness, guarding or rebound.  Musculoskeletal:     Cervical back: Normal range of motion and neck supple.     Right lower leg: Edema present.     Left lower leg: Edema present.     Comments: Trace edema BLE.   Skin:    General: Skin is warm and dry.  Neurological:     General: No focal deficit present.     Mental Status: She is alert. Mental status is at baseline.     Cranial Nerves: No cranial nerve deficit.     Motor: No weakness.     Coordination: Coordination normal.     Gait: Gait abnormal.     Comments: Oriented to person, place.   Psychiatric:        Mood and Affect: Mood normal.        Behavior: Behavior normal.        Thought Content: Thought content normal.     Comments: Anxious about getting more MiraLax.      Labs reviewed: Recent Labs    06/25/19 0000 11/16/19 0000  NA 141 138  K 4.2 4.8  CL 105 102  CO2 26 26*  BUN 16 19   CREATININE 1.0 1.2*  CALCIUM 9.5 9.4   Recent Labs    11/16/19 0000  AST 25  ALT 18  ALKPHOS 93  ALBUMIN 4.2   Recent Labs    11/16/19 0000  WBC 7.7  NEUTROABS 3,973  HGB 12.8  HCT 38  PLT 324   Lab Results  Component Value Date   TSH 2.52 11/21/2018   Lab Results  Component Value Date   HGBA1C 5.9 02/05/2017   Lab Results  Component Value Date   CHOL 247 (A) 11/21/2018   HDL 72 (A) 11/21/2018   LDLCALC 147 11/21/2018   LDLDIRECT 190.0 03/24/2012   TRIG 150 11/21/2018   CHOLHDL 4 03/24/2012    Significant Diagnostic Results in last 30 days:  No results found.  Assessment/Plan Constipation 11/23/19 desires to increase MiraLax to 2 capfuls bid, mix with 8 Oz fluids, update CBC/diff, CMP/eGFR. Continue Linzess, prn MOM, Dulcolax.     Depression with anxiety mood is managed, continue  Lorazepam 0.45m bid, Prolixin 116mqd, Librium 2578md, Amitriptyline 73m47m.    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      Family/ staff Communication: plan of care reviewed with the patient and charge nurse.   Labs/tests ordered: CBC/diff, CMP/eGFR  Time spend 40 minutes.

## 2019-11-24 LAB — BASIC METABOLIC PANEL
BUN: 17 (ref 4–21)
CO2: 23 — AB (ref 13–22)
Chloride: 105 (ref 99–108)
Creatinine: 1.1 (ref 0.5–1.1)
Glucose: 86
Potassium: 4.6 (ref 3.4–5.3)
Sodium: 142 (ref 137–147)

## 2019-11-24 LAB — CBC: RBC: 4.69 (ref 3.87–5.11)

## 2019-11-24 LAB — CBC AND DIFFERENTIAL
HCT: 43 (ref 36–46)
Hemoglobin: 14.2 (ref 12.0–16.0)
Neutrophils Absolute: 2702
Platelets: 319 (ref 150–399)
WBC: 6.1

## 2019-11-24 LAB — COMPREHENSIVE METABOLIC PANEL
Albumin: 4.6 (ref 3.5–5.0)
Calcium: 9.7 (ref 8.7–10.7)
GFR calc Af Amer: 54
GFR calc non Af Amer: 47
Globulin: 3

## 2019-11-24 LAB — HEPATIC FUNCTION PANEL
ALT: 22 (ref 7–35)
AST: 31 (ref 13–35)
Alkaline Phosphatase: 100 (ref 25–125)

## 2019-12-07 ENCOUNTER — Other Ambulatory Visit: Payer: Self-pay | Admitting: *Deleted

## 2019-12-07 DIAGNOSIS — G8929 Other chronic pain: Secondary | ICD-10-CM

## 2019-12-07 NOTE — Telephone Encounter (Signed)
Received fax from [facility] Pended [facility] and sent to Dr. Gupta for approval.  

## 2019-12-08 MED ORDER — HYDROCODONE-ACETAMINOPHEN 5-325 MG PO TABS: 1.0000 | ORAL_TABLET | ORAL | 0 refills | Status: DC

## 2019-12-14 ENCOUNTER — Other Ambulatory Visit: Payer: Self-pay | Admitting: *Deleted

## 2019-12-14 MED ORDER — LORAZEPAM 0.5 MG PO TABS: 0.5000 mg | ORAL_TABLET | Freq: Two times a day (BID) | ORAL | 0 refills | Status: DC

## 2019-12-14 NOTE — Telephone Encounter (Signed)
Received fax from [facility] Pended [facility] and sent to Dr. Gupta for approval.  

## 2019-12-17 ENCOUNTER — Other Ambulatory Visit: Payer: Self-pay | Admitting: *Deleted

## 2019-12-17 MED ORDER — CHLORDIAZEPOXIDE HCL 25 MG PO CAPS: ORAL_CAPSULE | ORAL | 0 refills | Status: DC

## 2019-12-17 NOTE — Telephone Encounter (Signed)
Received fax from [facility] and sent to Dr. Lyndel Safe for approval.

## 2019-12-23 ENCOUNTER — Encounter: Payer: Self-pay | Admitting: Nurse Practitioner

## 2019-12-23 ENCOUNTER — Non-Acute Institutional Stay: Payer: Medicare Other | Admitting: Nurse Practitioner

## 2019-12-23 DIAGNOSIS — I10 Essential (primary) hypertension: Secondary | ICD-10-CM

## 2019-12-23 DIAGNOSIS — K5909 Other constipation: Secondary | ICD-10-CM

## 2019-12-23 DIAGNOSIS — I739 Peripheral vascular disease, unspecified: Secondary | ICD-10-CM

## 2019-12-23 DIAGNOSIS — M159 Polyosteoarthritis, unspecified: Secondary | ICD-10-CM

## 2019-12-23 DIAGNOSIS — K219 Gastro-esophageal reflux disease without esophagitis: Secondary | ICD-10-CM | POA: Diagnosis not present

## 2019-12-23 DIAGNOSIS — F418 Other specified anxiety disorders: Secondary | ICD-10-CM

## 2019-12-23 NOTE — Assessment & Plan Note (Signed)
Her mood is stable, continue Lorazepam 0.5mg  bid, Prolixin 1mg  qd, Librium 25mg  qd. Amitriptyline 50mg  qd.

## 2019-12-23 NOTE — Assessment & Plan Note (Signed)
stable, continue prn Dulcolax tab/suppository, Linzess qd, prn MOM, MiraLax bid

## 2019-12-23 NOTE — Assessment & Plan Note (Addendum)
Stable, continue Famotidine, Mylanta.

## 2019-12-23 NOTE — Progress Notes (Signed)
Location:   [facility] Room Number: [place] of Service:  [phone]) Provider:  Destan Franchini, NP   Patient Care Team: [name], MD as PCP - General (Internal Medicine) [name], MD (Ophthalmology) [name], MD (Psychiatry) [name], NP as Nurse Practitioner (Nurse Practitioner) [name], MD as Consulting Physician (Orthopedic Surgery)  Extended Emergency Contact Information Primary Emergency Contact: [name], [place] Work Phone: [phone] Relation: Son Secondary Emergency Contact: [name] [place] of [place] Phone: [phone] Relation: Sister  Code Status:  DNR Goals of care: Advanced Directive information Advanced Directives 12/23/2019  Does Patient Have a Medical Advance Directive? Yes  Type of Paramedic of [place];Living will;Out of facility DNR (pink MOST or yellow form)  Does patient want to make changes to medical advance directive? No - Patient declined  Copy of [place] in Chart? Yes - validated most recent copy scanned in chart (See row information)  Pre-existing out of facility DNR order (yellow form or pink MOST form) Yellow form placed in chart (order not valid for inpatient use)     Chief Complaint  Patient presents with  . Medical Management of Chronic Issues    Routine Visit     HPI:  Pt is a 79 y.o. female seen today for medical management of chronic diseases.    The patient resides in [facility] for safety, care assistance, her mood is stable, on Lorazepam 0.5mg  bid, Prolixin 1mg  qd, Librium 25mg  qd. Amitriptyline 50mg  qd.  OA pain, in knees, controlled on Norco qam, Tylenol 325mg  bid. Constipation, stable, on prn Dulcolax tab/suppository, Linzess qd, prn MOM, MiraLax bid. GERD, stable, on Famotidine 20mg  qd, Mylanta 30mg  qhs. PVD/edema,  stable, on Furosemide 20mg  qd. Spironolactone 12.5mg  qd.   Past  Medical History:  Diagnosis Date  . Allergy   . Anemia   . Anxiety   . Asthma   . CAD (coronary artery disease)   . Depression   . Diverticulosis   . Dysfunction of eustachian tube   . Elevated LFTs   . Esophageal reflux   . Esophageal stricture 2002  . Hemorrhoids   . History of rectal polyps   . HLD (hyperlipidemia)   . HTN (hypertension)   . Hypertonicity of bladder   . Irritable bowel syndrome with constipation   . Obesity   . Optic neuritis   . Osteoporosis   . Peptic ulcer disease   . Peripheral neuropathy   . Trigeminal neuralgia    prior injury  . 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 Vitamin D deficiency    Past Surgical History:  Procedure Laterality Date  . ABDOMINAL HYSTERECTOMY    . ANAL RECTAL MANOMETRY N/A 07/05/2014   Procedure: ANO RECTAL MANOMETRY;  Surgeon: Leighton Ruff, MD;  Location: [facility];  Service: Endoscopy;  Laterality: N/A;  . APPENDECTOMY    . CAROTID ENDARTERECTOMY    . COLONOSCOPY  2008, 2015  . TONSILLECTOMY    . UPPER GASTROINTESTINAL ENDOSCOPY  2002    Allergies  Allergen Reactions  . Biaxin [Clarithromycin]   . Clarithromycin   . Doxycycline   . Fosamax [Alendronate Sodium]   . Geodon [Ziprasidone Hydrochloride]   . Lipitor [Atorvastatin Calcium]   . Penicillins   . Pravachol [Pravastatin Sodium]   . Statins   .  Sulfonamide Derivatives   . Zithromax [Azithromycin]   . Zocor [Simvastatin]     Allergies as of 12/23/2019      Reactions   Biaxin [clarithromycin]    Clarithromycin    Doxycycline    Fosamax [alendronate Sodium]    Geodon [ziprasidone Hydrochloride]    Lipitor [atorvastatin Calcium]    Penicillins    Pravachol [pravastatin Sodium]    Statins    Sulfonamide Derivatives    Zithromax [azithromycin]    Zocor [simvastatin]       Medication List       Accurate as of December 23, 2019  2:20 PM. If you have any questions, ask your nurse or  doctor.        STOP taking these medications   Oyster Shell Calcium/Vitamin D 250-125 MG-UNIT Tabs Stopped by: Kamora Vossler X Dameir Gentzler, NP     TAKE these medications   Tylenol 325 MG Caps Generic drug: Acetaminophen Take 325 mg by mouth in the morning and at bedtime. What changed: Another medication with the same name was removed. Continue taking this medication, and follow the directions you see here. Changed by: Alfred Eckley X Willmar Stockinger, NP   acetaminophen 325 MG tablet Commonly known as: TYLENOL Take 650 mg by mouth every 8 (eight) hours as needed. 8am & 8pm AS NEEDED FOR PAIN *NOT TO EXCEED 3,000 MG IN 24HRS* What changed: Another medication with the same name was removed. Continue taking this medication, and follow the directions you see here. Changed by: Johannes Everage X Reise Gladney, NP   amitriptyline 50 MG tablet Commonly known as: ELAVIL Take 50 mg by mouth at bedtime. 9pm   ARTIFICIAL TEAR OP Apply 1 drop to eye 3 (three) times daily. Both Eyes at 8am, 2pm, and 9pm.   aspirin 81 MG EC tablet Take 81 mg by mouth daily. 9am   betamethasone valerate 0.1 % cream Commonly known as: VALISONE Apply 1 application topically as needed. Apply  thin amount to vulva twice daily.   bisacodyl 10 MG suppository Commonly known as: DULCOLAX Place 10 mg rectally daily as needed for moderate constipation.   bisacodyl 5 MG EC tablet Commonly known as: DULCOLAX Take 5 mg by mouth daily as needed for moderate constipation.   camphor-menthol lotion Commonly known as: SARNA Apply topically. 0.5-0.5 % Three times a day as needed to mid back   carbamide peroxide 6.5 % OTIC solution Commonly known as: DEBROX Place 5 drops to affected ear every night for 3 days, then irrigate with bulb syringe. If not effective, notify MD.   CENTRUM SILVER PO Take 1 tablet by mouth. 8am   chlordiazePOXIDE 25 MG capsule Commonly known as: LIBRIUM Take one capsule by mouth at bedtime   famotidine 20 MG tablet Commonly known as:  PEPCID Take 20 mg by mouth at bedtime. 8pm   fluPHENAZine 1 MG tablet Commonly known as: PROLIXIN Take 1 mg by mouth at bedtime. 8pm.   fluticasone 50 MCG/ACT nasal spray Commonly known as: FLONASE Place 1 spray into both nostrils 2 (two) times daily. 8am & 8pm.   furosemide 20 MG tablet Commonly known as: LASIX Take 20 mg by mouth daily. 8am   HYDROcodone-acetaminophen 5-325 MG tablet Commonly known as: NORCO/VICODIN Take 1 tablet by mouth every morning.   hydrocortisone 2.5 % cream Apply 1 application topically 2 (two) times daily as needed. Apply to affected areas on face.   hydrocortisone cream 1 % Apply 1 application topically 2 (two) times daily. May self apply twice daily to  Hemorrhoids.   Ketoconazole-Hydrocortisone 2-2.5 % Crea Apply 1 application topically as needed. Apply to face for rash one time daily as needed.   Linzess 290 MCG Caps capsule Generic drug: linaclotide Take 290 mcg by mouth daily. Take by mouth 39minutes before breakfast on an empty stomach at 7:30am. Be sure to swallow whole   loratadine 10 MG tablet Commonly known as: CLARITIN Take 10 mg by mouth daily as needed for allergies.   LORazepam 0.5 MG tablet Commonly known as: ATIVAN Take 1 tablet (0.5 mg total) by mouth 2 (two) times daily.   magnesium hydroxide 400 MG/5ML suspension Commonly known as: MILK OF MAGNESIA Take 30 mLs by mouth daily as needed.   MIRALAX PO Take 17 g by mouth daily as needed.   polyethylene glycol 17 g packet Commonly known as: MIRALAX / GLYCOLAX Take 17 g by mouth in the morning and at bedtime.   mometasone 0.1 % cream Commonly known as: ELOCON May self administer to face twice daily as needed.   Mylanta 200-200-20 MG/5ML suspension Generic drug: alum & mag hydroxide-simeth Take 30 mLs by mouth at bedtime. 9pm   Mylanta 200-200-20 MG/5ML suspension Generic drug: alum & mag hydroxide-simeth Take 30 mLs by mouth every 4 (four) hours as needed for  indigestion or heartburn.   OYSTER SHELL PO Take 250 mg by mouth in the morning and at bedtime.   saccharomyces boulardii 250 MG capsule Commonly known as: FLORASTOR Take 250 mg by mouth daily. 8am   spironolactone 25 MG tablet Commonly known as: ALDACTONE Take 12.5 mg by mouth daily.   triamcinolone cream 0.5 % Commonly known as: KENALOG Apply 1 application topically 2 (two) times daily. Apply to back for itching in the morning and evening.       Review of Systems  Constitutional: Negative for activity change, appetite change, fatigue, fever and unexpected weight change.  HENT: Positive for hearing loss. Negative for congestion and voice change.   Eyes: Positive for visual disturbance.       Low vision  Respiratory: Negative for cough, shortness of breath and wheezing.   Cardiovascular: Positive for leg swelling. Negative for chest pain and palpitations.  Gastrointestinal: Negative for abdominal distention, abdominal pain, constipation and diarrhea.  Genitourinary: Negative for difficulty urinating, dysuria and urgency.  Musculoskeletal: Positive for arthralgias and gait problem. Negative for back pain.  Skin: Negative for color change.  Neurological: Negative for dizziness, speech difficulty and weakness.       Memory lapses  Psychiatric/Behavioral: Negative for agitation, behavioral problems, hallucinations and sleep disturbance. The patient is not nervous/anxious.     Immunization History  Administered Date(s) Administered  . Influenza Split 07/23/2013  . Influenza Whole 07/05/2009, 06/26/2018  . Influenza-Unspecified 07/29/2014, 06/14/2015, 07/11/2016, 07/15/2017, 06/27/2019  . Moderna SARS-COVID-2 Vaccination 09/26/2019, 10/24/2019  . PFIZER SARS-COV-2 Vaccination 09/26/2019  . PPD Test 06/17/2013  . Pneumococcal Conjugate-13 07/22/2017  . Pneumococcal Polysaccharide-23 05/05/2009  . Td 05/05/2009, 07/16/2019  . Zoster 01/09/2011   Pertinent  Health Maintenance  Due  Topic Date Due  . INFLUENZA VACCINE  Completed  . DEXA SCAN  Completed  . PNA vac Low Risk Adult  Completed   Fall Risk  06/10/2018 06/04/2017 07/29/2014  Falls in the past year? No Yes No  Number falls in past yr: - 1 -  Injury with Fall? - No -   Functional Status Survey:    Vitals:   12/23/19 1128  BP: 124/60  Pulse: 64  Resp: 18  Temp: (!)  97 F (36.1 C)  SpO2: 97%  Weight: 138 lb 6.4 oz (62.8 kg)  Height: 5\' 6"  (1.676 m)   Body mass index is 22.34 kg/m. Physical Exam Constitutional:      General: She is not in acute distress.    Appearance: Normal appearance. She is normal weight. She is not ill-appearing.  HENT:     Head: Normocephalic.     Nose: Nose normal.     Mouth/Throat:     Mouth: Mucous membranes are moist.  Eyes:     Extraocular Movements: Extraocular movements intact.     Conjunctiva/sclera: Conjunctivae normal.     Pupils: Pupils are equal, round, and reactive to light.     Comments: Legally blind  Cardiovascular:     Rate and Rhythm: Normal rate and regular rhythm.     Heart sounds: No murmur.  Pulmonary:     Effort: Pulmonary effort is normal.     Breath sounds: No rales.  Abdominal:     General: Bowel sounds are normal. There is no distension.     Palpations: Abdomen is soft.     Tenderness: There is no abdominal tenderness.  Musculoskeletal:     Cervical back: Normal range of motion and neck supple.     Right lower leg: Edema present.     Left lower leg: Edema present.     Comments: Trace edema BLE.   Skin:    General: Skin is warm and dry.  Neurological:     General: No focal deficit present.     Mental Status: She is alert. Mental status is at baseline.     Cranial Nerves: No cranial nerve deficit.     Motor: No weakness.     Coordination: Coordination normal.     Gait: Gait abnormal.     Comments: Oriented to person, place.   Psychiatric:        Mood and Affect: Mood normal.        Behavior: Behavior normal.        Thought  Content: Thought content normal.     Labs reviewed: Recent Labs    06/25/19 0000 11/16/19 0000 11/24/19 0000  NA 141 138 142  K 4.2 4.8 4.6  CL 105 102 105  CO2 26 26* 23*  BUN 16 19 17   CREATININE 1.0 1.2* 1.1  CALCIUM 9.5 9.4 9.7   Recent Labs    11/16/19 0000 11/24/19 0000  AST 25 31  ALT 18 22  ALKPHOS 93 100  ALBUMIN 4.2 4.6   Recent Labs    11/16/19 0000 11/24/19 0000  WBC 7.7 6.1  NEUTROABS 3,973 2,702  HGB 12.8 14.2  HCT 38 43  PLT 324 319   Lab Results  Component Value Date   TSH 2.52 11/21/2018   Lab Results  Component Value Date   HGBA1C 5.9 02/05/2017   Lab Results  Component Value Date   CHOL 247 (A) 11/21/2018   HDL 72 (A) 11/21/2018   LDLCALC 147 11/21/2018   LDLDIRECT 190.0 03/24/2012   TRIG 150 11/21/2018   CHOLHDL 4 03/24/2012    Significant Diagnostic Results in last 30 days:  No results found.  Assessment/Plan Essential hypertension Blood pressure is controlled.   PVD (peripheral vascular disease) (HCC) Stable, continue Furosemide, Spironolactone.   Constipation stable, continue prn Dulcolax tab/suppository, Linzess qd, prn MOM, MiraLax bid  GERD (gastroesophageal reflux disease) Stable, continue Famotidine, Mylanta.   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 Amitriptyline 50mg  qd.     Family/ staff Communication: plan of care reviewed with the patient and charge nurse.   Labs/tests ordered:  none  Time spend 40 minutes.

## 2019-12-23 NOTE — Assessment & Plan Note (Signed)
Stable, continue Furosemide, Spironolactone.

## 2019-12-23 NOTE — Assessment & Plan Note (Signed)
Pain is controlled, continue Tylenol, Norco °

## 2019-12-23 NOTE — Assessment & Plan Note (Signed)
Blood pressure is controlled

## 2020-01-04 ENCOUNTER — Non-Acute Institutional Stay: Payer: Medicare Other | Admitting: Nurse Practitioner

## 2020-01-04 ENCOUNTER — Other Ambulatory Visit: Payer: Self-pay | Admitting: *Deleted

## 2020-01-04 ENCOUNTER — Encounter: Payer: Self-pay | Admitting: Nurse Practitioner

## 2020-01-04 DIAGNOSIS — G8929 Other chronic pain: Secondary | ICD-10-CM

## 2020-01-04 DIAGNOSIS — M549 Dorsalgia, unspecified: Secondary | ICD-10-CM | POA: Diagnosis not present

## 2020-01-04 DIAGNOSIS — M159 Polyosteoarthritis, unspecified: Secondary | ICD-10-CM | POA: Diagnosis not present

## 2020-01-04 DIAGNOSIS — W19XXXA Unspecified fall, initial encounter: Secondary | ICD-10-CM

## 2020-01-04 DIAGNOSIS — F418 Other specified anxiety disorders: Secondary | ICD-10-CM | POA: Diagnosis not present

## 2020-01-04 MED ORDER — HYDROCODONE-ACETAMINOPHEN 5-325 MG PO TABS: 1.0000 | ORAL_TABLET | ORAL | 0 refills | Status: DC

## 2020-01-04 NOTE — Assessment & Plan Note (Signed)
Continue Tylenol, Norco for now.

## 2020-01-04 NOTE — Progress Notes (Signed)
Location:   [facility] Room Number: [id number] Place of Service:  ALF (13) Provider: Lennie Odor Irlene Crudup NP  [name], MD  Patient Care Team: [name], MD as PCP - General (Internal Medicine) [name], MD (Ophthalmology) [name], MD (Psychiatry) Bessy Reaney X, NP as Nurse Practitioner (Nurse Practitioner) [name], MD as Consulting Physician (Orthopedic Surgery)  Extended Emergency Contact Information Primary Emergency Contact: [name], [place] Work Phone: [phone] Relation: Son Secondary Emergency Contact: [name] [place] Phone: [phone] Relation: Sister  Code Status: DNR Goals of care: Advanced Directive information Advanced Directives 01/04/2020  Does Patient Have a Medical Advance Directive? Yes  Type of Advance Directive Living will;Healthcare Power of [place];Out of facility DNR (pink MOST or yellow form)  Does patient want to make changes to medical advance directive? No - Patient declined  Copy of [place] in Chart? Yes - validated most recent copy scanned in chart (See row information)  Pre-existing out of facility DNR order (yellow form or pink MOST form) Yellow form placed in chart (order not valid for inpatient use)     Chief Complaint  Patient presents with  . Acute Visit    Left back/chest pain    HPI:  Pt is a 79 y.o. female seen today for an acute visit for c/o left chest wall, left lower back pain sustained from a fall that she landed her left back on the table. Pain worsens with body movement or deep breathing. No O2 desaturation, no cough, or hemoptysis.  Hx of OA, on Norco 5/325mg  qd, Tylenol 325mg  bid. Hx of depression, taking Amitriptyline 50mg  qd, Chlordiazepoxide 25mg  qd, Fluphenazine 1mg  qd, Ativan 0.5 mg bid.    Past Medical History:  Diagnosis Date  . Allergy   . Anemia   . Anxiety   . Asthma   . CAD (coronary  artery disease)   . Depression   . Diverticulosis   . Dysfunction of eustachian tube   . Elevated LFTs   . Esophageal reflux   . Esophageal stricture 2002  . Hemorrhoids   . History of rectal polyps   . HLD (hyperlipidemia)   . HTN (hypertension)   . Hypertonicity of bladder   . Irritable bowel syndrome with constipation   . Obesity   . Optic neuritis   . Osteoporosis   . Peptic ulcer disease   . Peripheral neuropathy   . Trigeminal neuralgia    prior injury  . 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 Vitamin D deficiency    Past Surgical History:  Procedure Laterality Date  . ABDOMINAL HYSTERECTOMY    . ANAL RECTAL MANOMETRY N/A 07/05/2014   Procedure: ANO RECTAL MANOMETRY;  Surgeon: Leighton Ruff, MD;  Location: [facility];  Service: Endoscopy;  Laterality: N/A;  . APPENDECTOMY    . CAROTID ENDARTERECTOMY    . COLONOSCOPY  2008, 2015  . TONSILLECTOMY    . UPPER GASTROINTESTINAL ENDOSCOPY  2002    Allergies  Allergen Reactions  . Biaxin [Clarithromycin]   . Clarithromycin   . Doxycycline   . Fosamax [Alendronate Sodium]   . Geodon [Ziprasidone Hydrochloride]   . Lipitor [Atorvastatin Calcium]   . Penicillins   . Pravachol [Pravastatin Sodium]   . Statins   . Sulfonamide Derivatives   . Zithromax [Azithromycin]   .  Zocor [Simvastatin]     Allergies as of 01/04/2020      Reactions   Biaxin [clarithromycin]    Clarithromycin    Doxycycline    Fosamax [alendronate Sodium]    Geodon [ziprasidone Hydrochloride]    Lipitor [atorvastatin Calcium]    Penicillins    Pravachol [pravastatin Sodium]    Statins    Sulfonamide Derivatives    Zithromax [azithromycin]    Zocor [simvastatin]       Medication List       Accurate as of January 04, 2020 11:59 PM. If you have any questions, ask your nurse or doctor.        Tylenol 325 MG Caps Generic drug: Acetaminophen Take 325 mg by mouth in the  morning and at bedtime.   acetaminophen 325 MG tablet Commonly known as: TYLENOL Take 650 mg by mouth every 8 (eight) hours as needed. 8am & 8pm AS NEEDED FOR PAIN *NOT TO EXCEED 3,000 MG IN 24HRS*   amitriptyline 50 MG tablet Commonly known as: ELAVIL Take 50 mg by mouth at bedtime. 9pm   ARTIFICIAL TEAR OP Apply 1 drop to eye 3 (three) times daily. Both Eyes at 8am, 2pm, and 9pm.   aspirin 81 MG EC tablet Take 81 mg by mouth daily. 9am   betamethasone valerate 0.1 % cream Commonly known as: VALISONE Apply 1 application topically as needed. Apply  thin amount to vulva twice daily.   bisacodyl 10 MG suppository Commonly known as: DULCOLAX Place 10 mg rectally daily as needed for moderate constipation.   bisacodyl 5 MG EC tablet Commonly known as: DULCOLAX Take 5 mg by mouth daily as needed for moderate constipation.   camphor-menthol lotion Commonly known as: SARNA Apply topically. 0.5-0.5 % Three times a day as needed to mid back   carbamide peroxide 6.5 % OTIC solution Commonly known as: DEBROX Place 5 drops to affected ear every night for 3 days, then irrigate with bulb syringe. If not effective, notify MD.   CENTRUM SILVER PO Take 1 tablet by mouth. 8am   chlordiazePOXIDE 25 MG capsule Commonly known as: LIBRIUM Take one capsule by mouth at bedtime   famotidine 20 MG tablet Commonly known as: PEPCID Take 20 mg by mouth at bedtime. 8pm   fluPHENAZine 1 MG tablet Commonly known as: PROLIXIN Take 1 mg by mouth at bedtime. 8pm.   fluticasone 50 MCG/ACT nasal spray Commonly known as: FLONASE Place 1 spray into both nostrils 2 (two) times daily. 8am & 8pm.   furosemide 20 MG tablet Commonly known as: LASIX Take 20 mg by mouth daily. 8am   HYDROcodone-acetaminophen 5-325 MG tablet Commonly known as: NORCO/VICODIN Take 1 tablet by mouth every morning.   hydrocortisone 2.5 % cream Apply 1 application topically 2 (two) times daily as needed. Apply to  affected areas on face.   hydrocortisone cream 1 % Apply 1 application topically 2 (two) times daily. May self apply twice daily to Hemorrhoids.   Ketoconazole-Hydrocortisone 2-2.5 % Crea Apply 1 application topically as needed. Apply to face for rash one time daily as needed.   Linzess 290 MCG Caps capsule Generic drug: linaclotide Take 290 mcg by mouth daily. Take by mouth 61minutes before breakfast on an empty stomach at 7:30am. Be sure to swallow whole   loratadine 10 MG tablet Commonly known as: CLARITIN Take 10 mg by mouth daily as needed for allergies.   LORazepam 0.5 MG tablet Commonly known as: ATIVAN Take 1 tablet (0.5 mg total) by  mouth 2 (two) times daily.   magnesium hydroxide 400 MG/5ML suspension Commonly known as: MILK OF MAGNESIA Take 30 mLs by mouth daily as needed.   MIRALAX PO Take 17 g by mouth daily as needed.   polyethylene glycol 17 g packet Commonly known as: MIRALAX / GLYCOLAX Take 17 g by mouth in the morning and at bedtime.   mometasone 0.1 % cream Commonly known as: ELOCON May self administer to face twice daily as needed.   Mylanta 200-200-20 MG/5ML suspension Generic drug: alum & mag hydroxide-simeth Take 30 mLs by mouth at bedtime. 9pm   Mylanta 200-200-20 MG/5ML suspension Generic drug: alum & mag hydroxide-simeth Take 30 mLs by mouth every 4 (four) hours as needed for indigestion or heartburn.   OYSTER SHELL PO Take 250 mg by mouth in the morning and at bedtime.   saccharomyces boulardii 250 MG capsule Commonly known as: FLORASTOR Take 250 mg by mouth daily. 8am   spironolactone 25 MG tablet Commonly known as: ALDACTONE Take 12.5 mg by mouth daily.   triamcinolone cream 0.5 % Commonly known as: KENALOG Apply 1 application topically 2 (two) times daily. Apply to back for itching in the morning and evening.       Review of Systems  Constitutional: Negative for activity change, appetite change, fatigue and fever.  HENT:  Positive for hearing loss. Negative for congestion and voice change.   Eyes: Positive for visual disturbance.       Low vision  Respiratory: Negative for cough, shortness of breath and wheezing.   Cardiovascular: Positive for leg swelling. Negative for chest pain and palpitations.  Gastrointestinal: Negative for abdominal distention, abdominal pain, constipation and diarrhea.  Genitourinary: Negative for difficulty urinating, dysuria and urgency.  Musculoskeletal: Positive for arthralgias, back pain and gait problem.       Left posterior chest wall, left mid and lower back, worsens with body movement and deep breathing.   Skin: Negative for color change.  Neurological: Negative for dizziness, speech difficulty and weakness.       Memory lapses  Psychiatric/Behavioral: Negative for agitation, behavioral problems, hallucinations and sleep disturbance. The patient is not nervous/anxious.     Immunization History  Administered Date(s) Administered  . Influenza Split 07/23/2013  . Influenza Whole 07/05/2009, 06/26/2018  . Influenza-Unspecified 07/29/2014, 06/14/2015, 07/11/2016, 07/15/2017, 06/27/2019  . Moderna SARS-COVID-2 Vaccination 09/26/2019, 10/24/2019  . PFIZER SARS-COV-2 Vaccination 09/26/2019  . PPD Test 06/17/2013  . Pneumococcal Conjugate-13 07/22/2017  . Pneumococcal Polysaccharide-23 05/05/2009  . Td 05/05/2009, 07/16/2019  . Zoster 01/09/2011   Pertinent  Health Maintenance Due  Topic Date Due  . INFLUENZA VACCINE  04/24/2020  . DEXA SCAN  Completed  . PNA vac Low Risk Adult  Completed   Fall Risk  06/10/2018 06/04/2017 07/29/2014  Falls in the past year? No Yes No  Number falls in past yr: - 1 -  Injury with Fall? - No -   Functional Status Survey:    Vitals:   01/04/20 1021  BP: 124/60  Pulse: 64  Resp: 18  Temp: 98.1 F (36.7 C)  SpO2: 98%  Weight: 137 lb (62.1 kg)  Height: 5\' 6"  (1.676 m)   Body mass index is 22.11 kg/m. Physical Exam Constitutional:       General: She is not in acute distress.    Appearance: Normal appearance. She is normal weight. She is not ill-appearing.  HENT:     Head: Normocephalic.     Mouth/Throat:     Mouth: Mucous  membranes are moist.  Eyes:     Extraocular Movements: Extraocular movements intact.     Conjunctiva/sclera: Conjunctivae normal.     Pupils: Pupils are equal, round, and reactive to light.     Comments: Legally blind  Cardiovascular:     Rate and Rhythm: Normal rate and regular rhythm.     Heart sounds: No murmur.  Pulmonary:     Effort: Pulmonary effort is normal.     Breath sounds: No rales.  Abdominal:     General: Bowel sounds are normal. There is no distension.     Palpations: Abdomen is soft.     Tenderness: There is no abdominal tenderness.  Musculoskeletal:     Cervical back: Normal range of motion and neck supple.     Right lower leg: Edema present.     Left lower leg: Edema present.     Comments: Trace edema BLE. Left posterior chest wall, left mid and lower back, worsens with body movement and deep breathing. No spinous process tenderness palpated.   Skin:    General: Skin is warm and dry.  Neurological:     General: No focal deficit present.     Mental Status: She is alert. Mental status is at baseline.     Cranial Nerves: No cranial nerve deficit.     Motor: No weakness.     Coordination: Coordination normal.     Gait: Gait abnormal.     Comments: Oriented to person, place.   Psychiatric:        Mood and Affect: Mood normal.        Behavior: Behavior normal.        Thought Content: Thought content normal.     Labs reviewed: Recent Labs    06/25/19 0000 11/16/19 0000 11/24/19 0000  NA 141 138 142  K 4.2 4.8 4.6  CL 105 102 105  CO2 26 26* 23*  BUN 16 19 17   CREATININE 1.0 1.2* 1.1  CALCIUM 9.5 9.4 9.7   Recent Labs    11/16/19 0000 11/24/19 0000  AST 25 31  ALT 18 22  ALKPHOS 93 100  ALBUMIN 4.2 4.6   Recent Labs    11/16/19 0000 11/24/19 0000    WBC 7.7 6.1  NEUTROABS 3,973 2,702  HGB 12.8 14.2  HCT 38 43  PLT 324 319   Lab Results  Component Value Date   TSH 2.52 11/21/2018   Lab Results  Component Value Date   HGBA1C 5.9 02/05/2017   Lab Results  Component Value Date   CHOL 247 (A) 11/21/2018   HDL 72 (A) 11/21/2018   LDLCALC 147 11/21/2018   LDLDIRECT 190.0 03/24/2012   TRIG 150 11/21/2018   CHOLHDL 4 03/24/2012    Significant Diagnostic Results in last 30 days:  DG Ribs Unilateral W/Chest Left  Result Date: 01/05/2020 CLINICAL DATA:  79 year old female with history of trauma from a fall yesterday. Shortness of breath. Left-sided flank pain. EXAM: LEFT RIBS AND CHEST - 3+ VIEW COMPARISON:  Chest x-ray 12/31/2010. FINDINGS: Opacity at the left lung base favored to reflect subsegmental atelectasis. Right lung is clear. No pleural effusions. No evidence of pulmonary edema. Heart size is normal. Upper mediastinal contours are distorted by patient's rotation to the right. Dedicated views of the left ribs demonstrate acute nondisplaced fractures of the lateral aspects of the left sixth, seventh and eighth ribs. IMPRESSION: 1. Acute nondisplaced fractures through the lateral aspects of the left sixth, seventh and eighth ribs with  low lung volumes and probable subsegmental atelectasis in the left lower lobe. Electronically Signed   By: Vinnie Langton M.D.   On: 01/05/2020 07:20   DG Lumbar Spine Complete  Result Date: 01/05/2020 CLINICAL DATA:  Flank pain EXAM: LUMBAR SPINE - COMPLETE 4+ VIEW COMPARISON:  Abdominal CT 12/31/2016 FINDINGS: Remote L1 compression fracture with approximately 50% height loss, also seen in 2018. No visible acute fracture. Ordinary degenerative changes with mild dextroscoliosis. Osteopenia. IMPRESSION: 1. No acute finding. 2. Remote L1 compression fracture. Electronically Signed   By: Monte Fantasia M.D.   On: 01/05/2020 07:20    Assessment/Plan: Back pain due to injury Left lower back, mid  back, posterior left chest wall pain sustained from a fall, worsens with movement or deep breathing, the patient desires Ortho ASAP, declined X-ray ribs, chest, thoracic/lumbar spine, and pelvis at [facility].   Depression with anxiety Her mood is stable, continue Amitriptyline 50mg  qd, Chlordiazepoxide 25mg  qd, Fluphenazine 1mg  qd, Ativan 0.5 mg bid.   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 Continue Tylenol, Norco for now.   Fall The patient stated she lost balance, fell, landed her left back on the table, denied dizziness, chest pain/pressure, palpitation, or focal weakness associated with the event.     Family/ staff Communication: plan of care reviewed with the patient and charge nurse.   Labs/tests ordered:  None  Time spend 40 minutes.

## 2020-01-04 NOTE — Progress Notes (Addendum)
Location:   [facility] Room Number: [place] of Service:  ALF [phone]) Provider:  Mast, Maxie NP  [name], MD  Patient Care Team: [name], MD as PCP - General (Internal Medicine) [name], MD (Ophthalmology) [name], MD (Psychiatry) Mast, Man X, NP as Nurse Practitioner (Nurse Practitioner) [name], MD as Consulting Physician (Orthopedic Surgery)  Extended Emergency Contact Information Primary Emergency Contact: [name], [place] Work Phone: [phone] Relation: Son Secondary Emergency Contact: [name] [place] Phone: [phone] Relation: Sister  Code Status:  DNR Goals of care: Advanced Directive information Advanced Directives 01/04/2020  Does Patient Have a Medical Advance Directive? Yes  Type of Advance Directive Living will;Healthcare Power of [place];Out of facility DNR (pink MOST or yellow form)  Does patient want to make changes to medical advance directive? No - Patient declined  Copy of [place] in Chart? Yes - validated most recent copy scanned in chart (See row information)  Pre-existing out of facility DNR order (yellow form or pink MOST form) Yellow form placed in chart (order not valid for inpatient use)     Chief Complaint  Patient presents with  . Acute Visit    Left back/chest pain    HPI:  Pt is a 79 y.o. female seen today for an acute visit for    Past Medical History:  Diagnosis Date  . Allergy   . Anemia   . Anxiety   . Asthma   . CAD (coronary artery disease)   . Depression   . Diverticulosis   . Dysfunction of eustachian tube   . Elevated LFTs   . Esophageal reflux   . Esophageal stricture 2002  . Hemorrhoids   . History of rectal polyps   . HLD (hyperlipidemia)   . HTN (hypertension)   . Hypertonicity of bladder   . Irritable bowel syndrome with constipation   . Obesity   .  Optic neuritis   . Osteoporosis   . Peptic ulcer disease   . Peripheral neuropathy   . Trigeminal neuralgia    prior injury  . 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 Vitamin D deficiency    Past Surgical History:  Procedure Laterality Date  . ABDOMINAL HYSTERECTOMY    . ANAL RECTAL MANOMETRY N/A 07/05/2014   Procedure: ANO RECTAL MANOMETRY;  Surgeon: Leighton Ruff, MD;  Location: [facility];  Service: Endoscopy;  Laterality: N/A;  . APPENDECTOMY    . CAROTID ENDARTERECTOMY    . COLONOSCOPY  2008, 2015  . TONSILLECTOMY    . UPPER GASTROINTESTINAL ENDOSCOPY  2002    Allergies  Allergen Reactions  . Biaxin [Clarithromycin]   . Clarithromycin   . Doxycycline   . Fosamax [Alendronate Sodium]   . Geodon [Ziprasidone Hydrochloride]   . Lipitor [Atorvastatin Calcium]   . Penicillins   . Pravachol [Pravastatin Sodium]   . Statins   . Sulfonamide Derivatives   . Zithromax [Azithromycin]   . Zocor [Simvastatin]     Allergies as of 01/04/2020      Reactions   Biaxin [clarithromycin]    Clarithromycin    Doxycycline    Fosamax [alendronate Sodium]    Geodon [ziprasidone Hydrochloride]    Lipitor [atorvastatin Calcium]    Penicillins    Pravachol [pravastatin Sodium]    Statins  Sulfonamide Derivatives    Zithromax [azithromycin]    Zocor [simvastatin]       Medication List       Accurate as of January 04, 2020 11:59 PM. If you have any questions, ask your nurse or doctor.        Tylenol 325 MG Caps Generic drug: Acetaminophen Take 325 mg by mouth in the morning and at bedtime.   acetaminophen 325 MG tablet Commonly known as: TYLENOL Take 650 mg by mouth every 8 (eight) hours as needed. 8am & 8pm AS NEEDED FOR PAIN *NOT TO EXCEED 3,000 MG IN 24HRS*   amitriptyline 50 MG tablet Commonly known as: ELAVIL Take 50 mg by mouth at bedtime. 9pm   ARTIFICIAL TEAR OP Apply 1 drop to eye 3 (three) times  daily. Both Eyes at 8am, 2pm, and 9pm.   aspirin 81 MG EC tablet Take 81 mg by mouth daily. 9am   betamethasone valerate 0.1 % cream Commonly known as: VALISONE Apply 1 application topically as needed. Apply  thin amount to vulva twice daily.   bisacodyl 10 MG suppository Commonly known as: DULCOLAX Place 10 mg rectally daily as needed for moderate constipation.   bisacodyl 5 MG EC tablet Commonly known as: DULCOLAX Take 5 mg by mouth daily as needed for moderate constipation.   camphor-menthol lotion Commonly known as: SARNA Apply topically. 0.5-0.5 % Three times a day as needed to mid back   carbamide peroxide 6.5 % OTIC solution Commonly known as: DEBROX Place 5 drops to affected ear every night for 3 days, then irrigate with bulb syringe. If not effective, notify MD.   CENTRUM SILVER PO Take 1 tablet by mouth. 8am   chlordiazePOXIDE 25 MG capsule Commonly known as: LIBRIUM Take one capsule by mouth at bedtime   famotidine 20 MG tablet Commonly known as: PEPCID Take 20 mg by mouth at bedtime. 8pm   fluPHENAZine 1 MG tablet Commonly known as: PROLIXIN Take 1 mg by mouth at bedtime. 8pm.   fluticasone 50 MCG/ACT nasal spray Commonly known as: FLONASE Place 1 spray into both nostrils 2 (two) times daily. 8am & 8pm.   furosemide 20 MG tablet Commonly known as: LASIX Take 20 mg by mouth daily. 8am   HYDROcodone-acetaminophen 5-325 MG tablet Commonly known as: NORCO/VICODIN Take 1 tablet by mouth every morning.   hydrocortisone 2.5 % cream Apply 1 application topically 2 (two) times daily as needed. Apply to affected areas on face.   hydrocortisone cream 1 % Apply 1 application topically 2 (two) times daily. May self apply twice daily to Hemorrhoids.   Ketoconazole-Hydrocortisone 2-2.5 % Crea Apply 1 application topically as needed. Apply to face for rash one time daily as needed.   Linzess 290 MCG Caps capsule Generic drug: linaclotide Take 290 mcg by  mouth daily. Take by mouth 61minutes before breakfast on an empty stomach at 7:30am. Be sure to swallow whole   loratadine 10 MG tablet Commonly known as: CLARITIN Take 10 mg by mouth daily as needed for allergies.   LORazepam 0.5 MG tablet Commonly known as: ATIVAN Take 1 tablet (0.5 mg total) by mouth 2 (two) times daily.   magnesium hydroxide 400 MG/5ML suspension Commonly known as: MILK OF MAGNESIA Take 30 mLs by mouth daily as needed.   MIRALAX PO Take 17 g by mouth daily as needed.   polyethylene glycol 17 g packet Commonly known as: MIRALAX / GLYCOLAX Take 17 g by mouth in the morning and at bedtime.  mometasone 0.1 % cream Commonly known as: ELOCON May self administer to face twice daily as needed.   Mylanta 200-200-20 MG/5ML suspension Generic drug: alum & mag hydroxide-simeth Take 30 mLs by mouth at bedtime. 9pm   Mylanta 200-200-20 MG/5ML suspension Generic drug: alum & mag hydroxide-simeth Take 30 mLs by mouth every 4 (four) hours as needed for indigestion or heartburn.   OYSTER SHELL PO Take 250 mg by mouth in the morning and at bedtime.   saccharomyces boulardii 250 MG capsule Commonly known as: FLORASTOR Take 250 mg by mouth daily. 8am   spironolactone 25 MG tablet Commonly known as: ALDACTONE Take 12.5 mg by mouth daily.   triamcinolone cream 0.5 % Commonly known as: KENALOG Apply 1 application topically 2 (two) times daily. Apply to back for itching in the morning and evening.       Review of Systems  Immunization History  Administered Date(s) Administered  . Influenza Split 07/23/2013  . Influenza Whole 07/05/2009, 06/26/2018  . Influenza-Unspecified 07/29/2014, 06/14/2015, 07/11/2016, 07/15/2017, 06/27/2019  . Moderna SARS-COVID-2 Vaccination 09/26/2019, 10/24/2019  . PFIZER SARS-COV-2 Vaccination 09/26/2019  . PPD Test 06/17/2013  . Pneumococcal Conjugate-13 07/22/2017  . Pneumococcal Polysaccharide-23 05/05/2009  . Td 05/05/2009,  07/16/2019  . Zoster 01/09/2011   Pertinent  Health Maintenance Due  Topic Date Due  . INFLUENZA VACCINE  04/24/2020  . DEXA SCAN  Completed  . PNA vac Low Risk Adult  Completed   Fall Risk  06/10/2018 06/04/2017 07/29/2014  Falls in the past year? No Yes No  Number falls in past yr: - 1 -  Injury with Fall? - No -   Functional Status Survey:    Vitals:   01/04/20 1021  BP: 124/60  Pulse: 64  Resp: 18  Temp: 98.1 F (36.7 C)  SpO2: 98%  Weight: 137 lb (62.1 kg)  Height: 5\' 6"  (1.676 m)   Body mass index is 22.11 kg/m. Physical Exam  Labs reviewed: Recent Labs    06/25/19 0000 11/16/19 0000 11/24/19 0000  NA 141 138 142  K 4.2 4.8 4.6  CL 105 102 105  CO2 26 26* 23*  BUN 16 19 17   CREATININE 1.0 1.2* 1.1  CALCIUM 9.5 9.4 9.7   Recent Labs    11/16/19 0000 11/24/19 0000  AST 25 31  ALT 18 22  ALKPHOS 93 100  ALBUMIN 4.2 4.6   Recent Labs    11/16/19 0000 11/24/19 0000  WBC 7.7 6.1  NEUTROABS 3,973 2,702  HGB 12.8 14.2  HCT 38 43  PLT 324 319   Lab Results  Component Value Date   TSH 2.52 11/21/2018   Lab Results  Component Value Date   HGBA1C 5.9 02/05/2017   Lab Results  Component Value Date   CHOL 247 (A) 11/21/2018   HDL 72 (A) 11/21/2018   LDLCALC 147 11/21/2018   LDLDIRECT 190.0 03/24/2012   TRIG 150 11/21/2018   CHOLHDL 4 03/24/2012    Significant Diagnostic Results in last 30 days:  DG Ribs Unilateral W/Chest Left  Result Date: 01/05/2020 CLINICAL DATA:  79 year old female with history of trauma from a fall yesterday. Shortness of breath. Left-sided flank pain. EXAM: LEFT RIBS AND CHEST - 3+ VIEW COMPARISON:  Chest x-ray 12/31/2010. FINDINGS: Opacity at the left lung base favored to reflect subsegmental atelectasis. Right lung is clear. No pleural effusions. No evidence of pulmonary edema. Heart size is normal. Upper mediastinal contours are distorted by patient's rotation to the right. Dedicated views of  the left ribs  demonstrate acute nondisplaced fractures of the lateral aspects of the left sixth, seventh and eighth ribs. IMPRESSION: 1. Acute nondisplaced fractures through the lateral aspects of the left sixth, seventh and eighth ribs with low lung volumes and probable subsegmental atelectasis in the left lower lobe. Electronically Signed   By: Vinnie Langton M.D.   On: 01/05/2020 07:20   DG Lumbar Spine Complete  Result Date: 01/05/2020 CLINICAL DATA:  Flank pain EXAM: LUMBAR SPINE - COMPLETE 4+ VIEW COMPARISON:  Abdominal CT 12/31/2016 FINDINGS: Remote L1 compression fracture with approximately 50% height loss, also seen in 2018. No visible acute fracture. Ordinary degenerative changes with mild dextroscoliosis. Osteopenia. IMPRESSION: 1. No acute finding. 2. Remote L1 compression fracture. Electronically Signed   By: Monte Fantasia M.D.   On: 01/05/2020 07:20    Assessment/Plan There are no diagnoses linked to this encounter.   Family/ staff Communication:   Labs/tests ordered:

## 2020-01-04 NOTE — Assessment & Plan Note (Signed)
Left lower back, mid back, posterior left chest wall pain sustained from a fall, worsens with movement or deep breathing, the patient desires Ortho ASAP, declined X-ray ribs, chest, thoracic/lumbar spine, and pelvis at [facility].

## 2020-01-04 NOTE — Telephone Encounter (Signed)
Received fax refill Request from [facility] Pended [facility] and sent to Dr. Gupta for approval.  

## 2020-01-04 NOTE — Assessment & Plan Note (Signed)
The patient stated she lost balance, fell, landed her left back on the table, denied dizziness, chest pain/pressure, palpitation, or focal weakness associated with the event.

## 2020-01-04 NOTE — Assessment & Plan Note (Signed)
Her mood is stable, continue Amitriptyline 50mg  qd, Chlordiazepoxide 25mg  qd, Fluphenazine 1mg  qd, Ativan 0.5 mg bid.

## 2020-01-05 ENCOUNTER — Encounter: Payer: Self-pay | Admitting: Internal Medicine

## 2020-01-05 ENCOUNTER — Emergency Department (HOSPITAL_COMMUNITY): Payer: Medicare Other

## 2020-01-05 ENCOUNTER — Telehealth: Payer: Self-pay | Admitting: Family

## 2020-01-05 ENCOUNTER — Non-Acute Institutional Stay: Payer: Medicare Other | Admitting: Internal Medicine

## 2020-01-05 ENCOUNTER — Other Ambulatory Visit: Payer: Self-pay

## 2020-01-05 ENCOUNTER — Emergency Department (HOSPITAL_COMMUNITY)
Admission: EM | Admit: 2020-01-05 | Discharge: 2020-01-05 | Disposition: A | Discharge status: Home / Self Care | Payer: Medicare Other | Attending: Emergency Medicine | Admitting: Emergency Medicine

## 2020-01-05 ENCOUNTER — Encounter (HOSPITAL_COMMUNITY): Payer: Self-pay

## 2020-01-05 DIAGNOSIS — J45909 Unspecified asthma, uncomplicated: Secondary | ICD-10-CM | POA: Diagnosis not present

## 2020-01-05 DIAGNOSIS — W010XXA Fall on same level from slipping, tripping and stumbling without subsequent striking against object, initial encounter: Secondary | ICD-10-CM | POA: Diagnosis not present

## 2020-01-05 DIAGNOSIS — Y92129 Unspecified place in nursing home as the place of occurrence of the external cause: Secondary | ICD-10-CM | POA: Insufficient documentation

## 2020-01-05 DIAGNOSIS — S2242XA Multiple fractures of ribs, left side, initial encounter for closed fracture: Secondary | ICD-10-CM | POA: Diagnosis not present

## 2020-01-05 DIAGNOSIS — S2242XD Multiple fractures of ribs, left side, subsequent encounter for fracture with routine healing: Secondary | ICD-10-CM

## 2020-01-05 DIAGNOSIS — I10 Essential (primary) hypertension: Secondary | ICD-10-CM | POA: Diagnosis not present

## 2020-01-05 DIAGNOSIS — Z79899 Other long term (current) drug therapy: Secondary | ICD-10-CM | POA: Insufficient documentation

## 2020-01-05 DIAGNOSIS — I2510 Atherosclerotic heart disease of native coronary artery without angina pectoris: Secondary | ICD-10-CM | POA: Diagnosis not present

## 2020-01-05 DIAGNOSIS — Y999 Unspecified external cause status: Secondary | ICD-10-CM | POA: Insufficient documentation

## 2020-01-05 DIAGNOSIS — Y939 Activity, unspecified: Secondary | ICD-10-CM | POA: Insufficient documentation

## 2020-01-05 DIAGNOSIS — S299XXA Unspecified injury of thorax, initial encounter: Secondary | ICD-10-CM | POA: Diagnosis present

## 2020-01-05 MED ORDER — HYDROCODONE-ACETAMINOPHEN 5-325 MG PO TABS: 1.0000 | ORAL_TABLET | Freq: Four times a day (QID) | ORAL | 0 refills | Status: DC | PRN

## 2020-01-05 MED ORDER — ONDANSETRON 8 MG PO TBDP
8.0000 mg | ORAL_TABLET | Freq: Once | ORAL | Status: AC
  Administered 2020-01-05: 8 mg via ORAL
  Filled 2020-01-05: qty 1

## 2020-01-05 MED ORDER — HYDROCODONE-ACETAMINOPHEN 5-325 MG PO TABS
2.0000 | ORAL_TABLET | Freq: Once | ORAL | Status: AC
  Administered 2020-01-05: 2 via ORAL
  Filled 2020-01-05: qty 2

## 2020-01-05 NOTE — ED Notes (Signed)
Transported to radiology @ this time

## 2020-01-05 NOTE — ED Triage Notes (Signed)
Patient in from nursing home due to fall yesterday, complaining of left flank pain after a mechanical fall, denies any head trauma, no deformities noted.

## 2020-01-05 NOTE — ED Provider Notes (Signed)
Patient seen after prior ED provider.  Patient is awake and comfortable. She is ambulating without significant difficulty. She declines admission for pain control. She desires discharge back to her assisted living facility.  Patient does understand the need for assistance given her diagnosed rib fractures. She understands need for use of an incentive spirometer.  She does understand the need for close follow-up. Strict return precautions given and understood.   Valarie Merino, MD 01/05/20 [phone]

## 2020-01-05 NOTE — ED Notes (Signed)
Report called back to [place]

## 2020-01-05 NOTE — Progress Notes (Signed)
Location:    [facility] Room Number: [id number] Place of Service:  [facility] [phone]) Provider:    [name], MD  Patient Care Team: [name], MD as PCP - General (Internal Medicine) [name], MD (Ophthalmology) [name], MD (Psychiatry) Mast, Man X, NP as Nurse Practitioner (Nurse Practitioner) [name], MD as Consulting Physician (Orthopedic Surgery)  Extended Emergency Contact Information Primary Emergency Contact: [name], [place] Work Phone: [phone] Relation: Son Secondary Emergency Contact: [name] [place] Phone: [phone] Relation: Sister  Code Status:  DNR Goals of care: Advanced Directive information Advanced Directives 01/04/2020  Does Patient Have a Medical Advance Directive? Yes  Type of Advance Directive Living will;Healthcare Power of [place];Out of facility DNR (pink MOST or yellow form)  Does patient want to make changes to medical advance directive? No - Patient declined  Copy of [place] in Chart? Yes - validated most recent copy scanned in chart (See row information)  Pre-existing out of facility DNR order (yellow form or pink MOST form) Yellow form placed in chart (order not valid for inpatient use)     Chief Complaint  Patient presents with  . Acute Visit    Rib fractures    HPI:  Pt is a 79 y.o. female seen today for an acute visit for Follow up of ED visit for Rib Fractures  [facility] [place] resident of [facility]. She has h/o Hypertension, Osteoarthritis, GERD , Constipation , Bilateral Visual Loss and Anxiety with Depression  Patient lives in [place] . And Had Mechanical Fall in her Room. She c/o Severe Pain in her chest with difficulty to breadth Was send to ED Was found to have 3 non displaced Fractures with No Pneumothorax Send Back to Facility for observation Patient doing well in the facility walking.  Seems in no acute distress no  shortness of breath or pain.  The nurses reported if she needs more care/needs caregiver transfer to higher level of care.     Past Medical History:  Diagnosis Date  . Allergy   . Anemia   . Anxiety   . Asthma   . CAD (coronary artery disease)   . Depression   . Diverticulosis   . Dysfunction of eustachian tube   . Elevated LFTs   . Esophageal reflux   . Esophageal stricture 2002  . Hemorrhoids   . History of rectal polyps   . HLD (hyperlipidemia)   . HTN (hypertension)   . Hypertonicity of bladder   . Irritable bowel syndrome with constipation   . Obesity   . Optic neuritis   . Osteoporosis   . Peptic ulcer disease   . Peripheral neuropathy   . Trigeminal neuralgia    prior injury  . 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 Vitamin D deficiency    Past Surgical History:  Procedure Laterality Date  . ABDOMINAL HYSTERECTOMY    . ANAL RECTAL MANOMETRY N/A 07/05/2014   Procedure: ANO RECTAL MANOMETRY;  Surgeon: Leighton Ruff, MD;  Location: [facility];  Service: Endoscopy;  Laterality: N/A;  . APPENDECTOMY    . CAROTID ENDARTERECTOMY    . COLONOSCOPY  2008, 2015  . TONSILLECTOMY    . UPPER GASTROINTESTINAL ENDOSCOPY  2002    Allergies  Allergen Reactions  . Biaxin [Clarithromycin]   . Clarithromycin   .  Doxycycline   . Fosamax [Alendronate Sodium]   . Geodon [Ziprasidone Hydrochloride]   . Lipitor [Atorvastatin Calcium]   . Penicillins   . Pravachol [Pravastatin Sodium]   . Statins   . Sulfonamide Derivatives   . Zithromax [Azithromycin]   . Zocor [Simvastatin]     Allergies as of 01/05/2020      Reactions   Biaxin [clarithromycin]    Clarithromycin    Doxycycline    Fosamax [alendronate Sodium]    Geodon [ziprasidone Hydrochloride]    Lipitor [atorvastatin Calcium]    Penicillins    Pravachol [pravastatin Sodium]    Statins    Sulfonamide Derivatives    Zithromax [azithromycin]     Zocor [simvastatin]       Medication List       Accurate as of January 05, 2020  3:05 PM. If you have any questions, ask your nurse or doctor.        Tylenol 325 MG Caps Generic drug: Acetaminophen Take 325 mg by mouth in the morning and at bedtime.   acetaminophen 325 MG tablet Commonly known as: TYLENOL Take 650 mg by mouth every 8 (eight) hours as needed. 8am & 8pm AS NEEDED FOR PAIN *NOT TO EXCEED 3,000 MG IN 24HRS*   amitriptyline 50 MG tablet Commonly known as: ELAVIL Take 50 mg by mouth at bedtime. 9pm   ARTIFICIAL TEAR OP Apply 1 drop to eye 3 (three) times daily. Both Eyes at 8am, 2pm, and 9pm.   aspirin 81 MG EC tablet Take 81 mg by mouth daily. 9am   betamethasone valerate 0.1 % cream Commonly known as: VALISONE Apply 1 application topically as needed. Apply  thin amount to vulva twice daily.   bisacodyl 10 MG suppository Commonly known as: DULCOLAX Place 10 mg rectally daily as needed for moderate constipation.   bisacodyl 5 MG EC tablet Commonly known as: DULCOLAX Take 5 mg by mouth daily as needed for moderate constipation.   camphor-menthol lotion Commonly known as: SARNA Apply topically. 0.5-0.5 % Three times a day as needed to mid back   carbamide peroxide 6.5 % OTIC solution Commonly known as: DEBROX Place 5 drops to affected ear every night for 3 days, then irrigate with bulb syringe. If not effective, notify MD.   CENTRUM SILVER PO Take 1 tablet by mouth. 8am   chlordiazePOXIDE 25 MG capsule Commonly known as: LIBRIUM Take one capsule by mouth at bedtime   famotidine 20 MG tablet Commonly known as: PEPCID Take 20 mg by mouth at bedtime. 8pm   fluPHENAZine 1 MG tablet Commonly known as: PROLIXIN Take 1 mg by mouth at bedtime. 8pm.   fluticasone 50 MCG/ACT nasal spray Commonly known as: FLONASE Place 1 spray into both nostrils 2 (two) times daily. 8am & 8pm.   furosemide 20 MG tablet Commonly known as: LASIX Take 20 mg by mouth  daily. 8am   HYDROcodone-acetaminophen 5-325 MG tablet Commonly known as: NORCO/VICODIN Take 1 tablet by mouth every morning.   HYDROcodone-acetaminophen 5-325 MG tablet Commonly known as: NORCO/VICODIN Take 1 tablet by mouth every 6 (six) hours as needed.   hydrocortisone 2.5 % cream Apply 1 application topically 2 (two) times daily as needed. Apply to affected areas on face.   hydrocortisone cream 1 % Apply 1 application topically 2 (two) times daily. May self apply twice daily to Hemorrhoids.   Ketoconazole-Hydrocortisone 2-2.5 % Crea Apply 1 application topically as needed. Apply to face for rash one time daily as needed.  Linzess 290 MCG Caps capsule Generic drug: linaclotide Take 290 mcg by mouth daily. Take by mouth 63minutes before breakfast on an empty stomach at 7:30am. Be sure to swallow whole   loratadine 10 MG tablet Commonly known as: CLARITIN Take 10 mg by mouth daily as needed for allergies.   LORazepam 0.5 MG tablet Commonly known as: ATIVAN Take 1 tablet (0.5 mg total) by mouth 2 (two) times daily.   magnesium hydroxide 400 MG/5ML suspension Commonly known as: MILK OF MAGNESIA Take 30 mLs by mouth daily as needed.   MIRALAX PO Take 17 g by mouth daily as needed.   polyethylene glycol 17 g packet Commonly known as: MIRALAX / GLYCOLAX Take 17 g by mouth in the morning and at bedtime.   mometasone 0.1 % cream Commonly known as: ELOCON May self administer to face twice daily as needed.   Mylanta 200-200-20 MG/5ML suspension Generic drug: alum & mag hydroxide-simeth Take 30 mLs by mouth at bedtime. 9pm   Mylanta 200-200-20 MG/5ML suspension Generic drug: alum & mag hydroxide-simeth Take 30 mLs by mouth every 4 (four) hours as needed for indigestion or heartburn.   OYSTER SHELL PO Take 250 mg by mouth in the morning and at bedtime.   saccharomyces boulardii 250 MG capsule Commonly known as: FLORASTOR Take 250 mg by mouth daily. 8am     spironolactone 25 MG tablet Commonly known as: ALDACTONE Take 12.5 mg by mouth daily.   triamcinolone cream 0.5 % Commonly known as: KENALOG Apply 1 application topically 2 (two) times daily. Apply to back for itching in the morning and evening.       Review of Systems  Review of Systems  Constitutional: Negative for activity change, appetite change, chills, diaphoresis, fatigue and fever.  HENT: Negative for mouth sores, postnasal drip, rhinorrhea, sinus pain and sore throat.   Respiratory: Negative for apnea, cough, chest tightness, shortness of breath and wheezing.   Cardiovascular: Negative for chest pain, palpitations and leg swelling.  Gastrointestinal: Negative for abdominal distention, abdominal pain, constipation, diarrhea, nausea and vomiting.  Genitourinary: Negative for dysuria and frequency.  Musculoskeletal: Negative for arthralgias, joint swelling and myalgias.  Skin: Negative for rash.  Neurological: Negative for dizziness, syncope, weakness, light-headedness and numbness.  Psychiatric/Behavioral: Negative for behavioral problems, confusion and sleep disturbance.     Immunization History  Administered Date(s) Administered  . Influenza Split 07/23/2013  . Influenza Whole 07/05/2009, 06/26/2018  . Influenza-Unspecified 07/29/2014, 06/14/2015, 07/11/2016, 07/15/2017, 06/27/2019  . Moderna SARS-COVID-2 Vaccination 09/26/2019, 10/24/2019  . PFIZER SARS-COV-2 Vaccination 09/26/2019  . PPD Test 06/17/2013  . Pneumococcal Conjugate-13 07/22/2017  . Pneumococcal Polysaccharide-23 05/05/2009  . Td 05/05/2009, 07/16/2019  . Zoster 01/09/2011   Pertinent  Health Maintenance Due  Topic Date Due  . INFLUENZA VACCINE  04/24/2020  . DEXA SCAN  Completed  . PNA vac Low Risk Adult  Completed   Fall Risk  06/10/2018 06/04/2017 07/29/2014  Falls in the past year? No Yes No  Number falls in past yr: - 1 -  Injury with Fall? - No -   Functional Status Survey:    Vitals:    01/05/20 1402  BP: (!) 146/80  Pulse: 72  Resp: 18  Temp: 97.6 F (36.4 C)  SpO2: 95%  Weight: 137 lb (62.1 kg)  Height: 5\' 6"  (1.676 m)   Body mass index is 22.11 kg/m. Physical Exam Constitutional: Oriented to person, place, and time. Well-developed and well-nourished.  HENT:  Head: Normocephalic.  Mouth/Throat: Oropharynx is  clear and moist.  Eyes: Pupils are equal, round, and reactive to light.  Neck: Neck supple.  Cardiovascular: Normal rate and normal heart sounds.  No murmur heard. Pulmonary/Chest: Effort normal and breath sounds normal. No respiratory distress. No wheezes. She has no rales. Decreased BS in Left Side Abdominal: Soft. Bowel sounds are normal. No distension. There is no tenderness. There is no rebound.  Musculoskeletal: No edema.  Lymphadenopathy: none Neurological: Alert and oriented to person, place, and time.  Skin: Skin is warm and dry.  Psychiatric: Normal mood and affect. Behavior is normal. Thought content normal.   Labs reviewed: Recent Labs    06/25/19 0000 11/16/19 0000 11/24/19 0000  NA 141 138 142  K 4.2 4.8 4.6  CL 105 102 105  CO2 26 26* 23*  BUN 16 19 17   CREATININE 1.0 1.2* 1.1  CALCIUM 9.5 9.4 9.7   Recent Labs    11/16/19 0000 11/24/19 0000  AST 25 31  ALT 18 22  ALKPHOS 93 100  ALBUMIN 4.2 4.6   Recent Labs    11/16/19 0000 11/24/19 0000  WBC 7.7 6.1  NEUTROABS 3,973 2,702  HGB 12.8 14.2  HCT 38 43  PLT 324 319   Lab Results  Component Value Date   TSH 2.52 11/21/2018   Lab Results  Component Value Date   HGBA1C 5.9 02/05/2017   Lab Results  Component Value Date   CHOL 247 (A) 11/21/2018   HDL 72 (A) 11/21/2018   LDLCALC 147 11/21/2018   LDLDIRECT 190.0 03/24/2012   TRIG 150 11/21/2018   CHOLHDL 4 03/24/2012    Significant Diagnostic Results in last 30 days:  DG Ribs Unilateral W/Chest Left  Result Date: 01/05/2020 CLINICAL DATA:  79 year old female with history of trauma from a fall  yesterday. Shortness of breath. Left-sided flank pain. EXAM: LEFT RIBS AND CHEST - 3+ VIEW COMPARISON:  Chest x-ray 12/31/2010. FINDINGS: Opacity at the left lung base favored to reflect subsegmental atelectasis. Right lung is clear. No pleural effusions. No evidence of pulmonary edema. Heart size is normal. Upper mediastinal contours are distorted by patient's rotation to the right. Dedicated views of the left ribs demonstrate acute nondisplaced fractures of the lateral aspects of the left sixth, seventh and eighth ribs. IMPRESSION: 1. Acute nondisplaced fractures through the lateral aspects of the left sixth, seventh and eighth ribs with low lung volumes and probable subsegmental atelectasis in the left lower lobe. Electronically Signed   By: Vinnie Langton M.D.   On: 01/05/2020 07:20   DG Lumbar Spine Complete  Result Date: 01/05/2020 CLINICAL DATA:  Flank pain EXAM: LUMBAR SPINE - COMPLETE 4+ VIEW COMPARISON:  Abdominal CT 12/31/2016 FINDINGS: Remote L1 compression fracture with approximately 50% height loss, also seen in 2018. No visible acute fracture. Ordinary degenerative changes with mild dextroscoliosis. Osteopenia. IMPRESSION: 1. No acute finding. 2. Remote L1 compression fracture. Electronically Signed   By: Monte Fantasia M.D.   On: 01/05/2020 07:20    Assessment/Plan  S/P Multiple Rib Fractures Patient doing well No SOB. Pain Controlled on Tylenol. No Increase in Norco Needed right now Will continue to monitor in [facility] for now  Other Issues Depression with anxiety Follows with Psych On Librium and Prolixin Age-related osteoporosis with current pathological fracture, initial encounter Was on Prolia before Tscore was -3.6 in 2018 Was discontinued after 3 doses. Not sure why I have told Nurses to d/w her son. Patient was not aware It seems it was related to her Deductible  for the Meds  Mixed hyperlipidemia Has allergies listed to Statin  Family/ staff Communication:    Labs/tests ordered:

## 2020-01-05 NOTE — Telephone Encounter (Signed)
Late Entry 01/05/2020 at 4:33 Am  : Facility Nurse called while on call  stated patient was complaining of severe left rib / back pain post fall 01/04/2020.X-ray already ordered awaiting X-ray to be done.Advised to send patient to ED for evaluation of rib and back pain.

## 2020-01-05 NOTE — ED Notes (Signed)
[facility]

## 2020-01-05 NOTE — ED Notes (Signed)
Pt educated on the use of incentive spirometry. Pt demonstrated use. Pt advised on frequency of use. Pt verbalizes understanding.

## 2020-01-05 NOTE — Addendum Note (Signed)
Addended by: Amandajo Gonder X on: 01/05/2020 10:48 AM   Modules accepted: Level of Service

## 2020-01-05 NOTE — ED Provider Notes (Signed)
[facility] DEPT Provider Note   CSN: [id number] Arrival date & time: 01/05/20  [id number]     History Chief Complaint  Patient presents with  . Fall    left flank pain    Jean [name] is a 79 y.o. female.  The history is provided by the patient.  Fall This is a new problem. The current episode started yesterday. The problem occurs constantly. The problem has been gradually worsening. Associated symptoms include chest pain. Pertinent negatives include no abdominal pain. Exacerbated by: movement. The symptoms are relieved by rest.   Patient with history of anemia, anxiety, CAD, depression presents after fall.  Patient lives in assisted living.  She reports that she slid down and landed on her left side.  She reports significant pain in her ribs and back.  No head injury or LOC.  She reports it hurts to breathe or move.  No abdominal pain.  No vomiting.  No focal weakness.    Past Medical History:  Diagnosis Date  . Allergy   . Anemia   . Anxiety   . Asthma   . CAD (coronary artery disease)   . Depression   . Diverticulosis   . Dysfunction of eustachian tube   . Elevated LFTs   . Esophageal reflux   . Esophageal stricture 2002  . Hemorrhoids   . History of rectal polyps   . HLD (hyperlipidemia)   . HTN (hypertension)   . Hypertonicity of bladder   . Irritable bowel syndrome with constipation   . Obesity   . Optic neuritis   . Osteoporosis   . Peptic ulcer disease   . Peripheral neuropathy   . Trigeminal neuralgia    prior injury  . 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 Vitamin D deficiency     Patient Active Problem List   Diagnosis Date Noted  . Fall 01/04/2020  . Dizziness 11/16/2019  . Back pain due to injury 11/16/2019  . 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 UTI (urinary tract infection) 01/27/2015  . Tinnitus of both ears  01/21/2015  . Tear of meniscus of left knee 01/21/2015  . Rectocele 06/28/2014  . Pelvic floor dysfunction 06/28/2014  . Hemorrhoids 02/11/2014  . GERD (gastroesophageal reflux disease) 04/13/2013  . Carotid artery disease ([place]) 12/31/2011  . Constipation 02/23/2011  . OPTIC NEURITIS 05/11/2010  . Osteoporosis 05/05/2009  . Diverticulosis of large intestine without diverticulitis 12/02/2008  . Irritable bowel syndrome 12/02/2008  . RECTAL POLYPS 12/02/2008  . History of muscle pain 08/17/2008  . PVD (peripheral vascular disease) ([place]) 08/17/2008  . VITAMIN D DEFICIENCY 05/19/2008  . Hyperlipidemia 05/19/2008  . Depression with anxiety 01/06/2008  . Essential hypertension 11/20/2007  . Seasonal and perennial allergic rhinitis 08/01/2007    Past Surgical History:  Procedure Laterality Date  . ABDOMINAL HYSTERECTOMY    . ANAL RECTAL MANOMETRY N/A 07/05/2014   Procedure: ANO RECTAL MANOMETRY;  Surgeon: Leighton Ruff, MD;  Location: [facility];  Service: Endoscopy;  Laterality: N/A;  . APPENDECTOMY    . CAROTID ENDARTERECTOMY    . COLONOSCOPY  2008, 2015  . TONSILLECTOMY    . UPPER GASTROINTESTINAL ENDOSCOPY  2002     OB History   No obstetric history on file.     Family History  Problem Relation Age of Onset  . Alzheimer's disease Mother   . Coronary artery disease  Father   . Heart attack Father   . Heart disease Father   . Anuerysm Father        AAA  . Ovarian cancer Sister   . Lung cancer Brother   . Heart disease Brother   . Anuerysm Brother        AAA  . Diabetes Neg Hx   . Colon cancer Neg Hx     Social History   Tobacco Use  . Smoking status: Never Smoker  . Smokeless tobacco: Never Used  Substance Use Topics  . Alcohol use: No    Alcohol/week: 0.0 standard drinks  . Drug use: No    Home Medications Prior to Admission medications   Medication Sig Start Date End Date Taking? Authorizing Provider  Acetaminophen (TYLENOL) 325 MG CAPS Take 325 mg  by mouth in the morning and at bedtime.    [provider]  acetaminophen (TYLENOL) 325 MG tablet Take 650 mg by mouth every 8 (eight) hours as needed. 8am & 8pm AS NEEDED FOR PAIN *NOT TO EXCEED 3,000 MG IN 24HRS*    [provider]  alum & mag hydroxide-simeth (MYLANTA) 200-200-20 MG/5ML suspension Take 30 mLs by mouth at bedtime. 9pm    [provider]  alum & mag hydroxide-simeth (MYLANTA) 200-200-20 MG/5ML suspension Take 30 mLs by mouth every 4 (four) hours as needed for indigestion or heartburn.    [provider]  amitriptyline (ELAVIL) 50 MG tablet Take 50 mg by mouth at bedtime. 9pm    [provider]  ARTIFICIAL TEAR OP Apply 1 drop to eye 3 (three) times daily. Both Eyes at 8am, 2pm, and 9pm.    [provider]  aspirin 81 MG EC tablet Take 81 mg by mouth daily. 9am    [provider]  betamethasone valerate (VALISONE) 0.1 % cream Apply 1 application topically as needed. Apply  thin amount to vulva twice daily.    [provider]  bisacodyl (DULCOLAX) 10 MG suppository Place 10 mg rectally daily as needed for moderate constipation.    [provider]  bisacodyl (DULCOLAX) 5 MG EC tablet Take 5 mg by mouth daily as needed for moderate constipation.    [provider]  camphor-menthol Timoteo Ace) lotion Apply topically. 0.5-0.5 % Three times a day as needed to mid back    [provider]  carbamide peroxide (DEBROX) 6.5 % OTIC solution Place 5 drops to affected ear every night for 3 days, then irrigate with bulb syringe. If not effective, notify MD.    [provider]  chlordiazePOXIDE (LIBRIUM) 25 MG capsule Take one capsule by mouth at bedtime 12/17/19   [name], MD  famotidine (PEPCID) 20 MG tablet Take 20 mg by mouth at bedtime. 8pm    [provider]  fluPHENAZine (PROLIXIN) 1 MG tablet Take 1 mg by mouth at bedtime. 8pm.    [provider]  fluticasone  (FLONASE) 50 MCG/ACT nasal spray Place 1 spray into both nostrils 2 (two) times daily. 8am & 8pm.    [provider]  furosemide (LASIX) 20 MG tablet Take 20 mg by mouth daily. 8am    [provider]  HYDROcodone-acetaminophen (NORCO/VICODIN) 5-325 MG tablet Take 1 tablet by mouth every morning. 01/04/20   Mast, Man X, NP  hydrocortisone 2.5 % cream Apply 1 application topically 2 (two) times daily as needed. Apply to affected areas on face.    [provider]  hydrocortisone cream 1 % Apply  1 application topically 2 (two) times daily. May self apply twice daily to Hemorrhoids.    [provider]  Ketoconazole-Hydrocortisone 2-2.5 % CREA Apply 1 application topically as needed. Apply to face for rash one time daily as needed.    [provider]  linaclotide (LINZESS) 290 MCG CAPS capsule Take 290 mcg by mouth daily. Take by mouth 40minutes before breakfast on an empty stomach at 7:30am. Be sure to swallow whole    [provider]  loratadine (CLARITIN) 10 MG tablet Take 10 mg by mouth daily as needed for allergies.    [provider]  LORazepam (ATIVAN) 0.5 MG tablet Take 1 tablet (0.5 mg total) by mouth 2 (two) times daily. 12/14/19   [name], MD  magnesium hydroxide (MILK OF MAGNESIA) 400 MG/5ML suspension Take 30 mLs by mouth daily as needed.     [provider]  mometasone (ELOCON) 0.1 % cream May self administer to face twice daily as needed.    [provider]  Multiple Vitamins-Minerals (CENTRUM SILVER PO) Take 1 tablet by mouth. 8am    [provider]  OYSTER SHELL PO Take 250 mg by mouth in the morning and at bedtime.    [provider]  polyethylene glycol (MIRALAX / GLYCOLAX) 17 g packet Take 17 g by mouth in the morning and at bedtime.    [provider]  Polyethylene Glycol 3350 (MIRALAX PO) Take 17 g by mouth daily as needed.    [provider]  saccharomyces  boulardii (FLORASTOR) 250 MG capsule Take 250 mg by mouth daily. 8am    [provider]  spironolactone (ALDACTONE) 25 MG tablet Take 12.5 mg by mouth daily.    [provider]  triamcinolone cream (KENALOG) 0.5 % Apply 1 application topically 2 (two) times daily. Apply to back for itching in the morning and evening.    [provider]    Allergies    Biaxin [clarithromycin], Clarithromycin, Doxycycline, Fosamax [alendronate sodium], Geodon [ziprasidone hydrochloride], Lipitor [atorvastatin calcium], Penicillins, Pravachol [pravastatin sodium], Statins, Sulfonamide derivatives, Zithromax [azithromycin], and Zocor [simvastatin]  Review of Systems   Review of Systems  Constitutional: Negative for fever.  Cardiovascular: Positive for chest pain.  Gastrointestinal: Negative for abdominal pain.  Musculoskeletal: Positive for back pain.  All other systems reviewed and are negative.   Physical Exam Updated Vital Signs BP (!) 156/86 (BP Location: Left Arm)   Pulse 75   Temp 97.9 F (36.6 C) (Oral)   Resp 18   Ht 1.588 m (5' 2.5")   Wt 62.1 kg   SpO2 98%   BMI 24.66 kg/m   Physical Exam CONSTITUTIONAL: Elderly, uncomfortable. HEAD: Normocephalic/atraumatic EYES: EOMI/PERRL ENMT: Mucous membranes moist NECK: supple no meningeal signs SPINE/BACK:entire spine nontender, no bruising/crepitance/stepoffs noted to spine CV: S1/S2 noted, no murmurs/rubs/gallops noted LUNGS: Lungs are clear to auscultation bilaterally, no apparent distress Chest-diffuse left-sided chest wall tenderness, no crepitus or bruising ABDOMEN: soft, nontender, no rebound or guarding, bowel sounds noted throughout abdomen, no LUQ tenderness, no bruising GU:no cva tenderness, no bruising NEURO: Pt is awake/alert/appropriate, moves all extremitiesx4.  No facial droop.  EXTREMITIES: pulses normal/equal, full ROM, pelvis stable, all other extremities/joints palpated/ranged and nontender SKIN:  warm, color normal PSYCH: no abnormalities of mood noted, alert and oriented to situation  ED Results / Procedures / Treatments   Labs (all labs ordered are listed, but only abnormal results are displayed) Labs Reviewed - No data to display  EKG  ED  ECG REPORT   Date: 01/05/2020 0539  Rate: 71  Rhythm: normal sinus rhythm  QRS Axis: normal  Intervals: QT prolonged  ST/T Wave abnormalities: nonspecific ST changes  Conduction Disutrbances:none  I have personally reviewed the EKG tracing and agree with the computerized printout as noted.  Radiology DG Ribs Unilateral W/Chest Left  Result Date: 01/05/2020 CLINICAL DATA:  79 year old female with history of trauma from a fall yesterday. Shortness of breath. Left-sided flank pain. EXAM: LEFT RIBS AND CHEST - 3+ VIEW COMPARISON:  Chest x-ray 12/31/2010. FINDINGS: Opacity at the left lung base favored to reflect subsegmental atelectasis. Right lung is clear. No pleural effusions. No evidence of pulmonary edema. Heart size is normal. Upper mediastinal contours are distorted by patient's rotation to the right. Dedicated views of the left ribs demonstrate acute nondisplaced fractures of the lateral aspects of the left sixth, seventh and eighth ribs. IMPRESSION: 1. Acute nondisplaced fractures through the lateral aspects of the left sixth, seventh and eighth ribs with low lung volumes and probable subsegmental atelectasis in the left lower lobe. Electronically Signed   By: Vinnie Langton M.D.   On: 01/05/2020 07:20   DG Lumbar Spine Complete  Result Date: 01/05/2020 CLINICAL DATA:  Flank pain EXAM: LUMBAR SPINE - COMPLETE 4+ VIEW COMPARISON:  Abdominal CT 12/31/2016 FINDINGS: Remote L1 compression fracture with approximately 50% height loss, also seen in 2018. No visible acute fracture. Ordinary degenerative changes with mild dextroscoliosis. Osteopenia. IMPRESSION: 1. No acute finding. 2. Remote L1 compression fracture. Electronically Signed   By:  Monte Fantasia M.D.   On: 01/05/2020 07:20    Procedures Procedures   Medications Ordered in ED Medications  ondansetron (ZOFRAN-ODT) disintegrating tablet 8 mg (8 mg Oral Given 01/05/20 0624)  HYDROcodone-acetaminophen (NORCO/VICODIN) 5-325 MG per tablet 2 tablet (2 tablets Oral Given 01/05/20 [id number])    ED Course  I have reviewed the triage vital signs and the nursing notes.  Pertinent imaging results that were available during my care of the patient were reviewed by me and considered in my medical decision making (see chart for details).    MDM Rules/Calculators/A&P                      7:37 AM Patient presents after mechanical fall.  She sustained 3 nondisplaced rib fractures.  No pneumothorax.  She had some pain relief with Vicodin.  Plan to ambulate patient, but if needs further pain medications will likely need to be admitted. Signed out to dr Francia Greaves at shift change  Final Clinical Impression(s) / ED Diagnoses Final diagnoses:  None    Rx / DC Orders ED Discharge Orders    None       Ripley Fraise, MD 01/05/20 [phone]

## 2020-01-05 NOTE — ED Notes (Addendum)
DNR sent with [facility] - with all paperwork

## 2020-01-05 NOTE — ED Notes (Signed)
Purewick placed on pt at this time.  °

## 2020-01-05 NOTE — ED Notes (Signed)
[facility] notified need for transport  

## 2020-01-05 NOTE — Discharge Instructions (Signed)
Please use caution when ambulating. Please return to the ED for any problem. Follow-up with your regular care provider as instructed.  Use incentive spirometer as instructed as frequently as possible (at least every 2 hours while awake for the next week).

## 2020-01-06 ENCOUNTER — Encounter: Payer: Self-pay | Admitting: Nurse Practitioner

## 2020-01-06 DIAGNOSIS — S2249XA Multiple fractures of ribs, unspecified side, initial encounter for closed fracture: Secondary | ICD-10-CM | POA: Insufficient documentation

## 2020-01-13 ENCOUNTER — Non-Acute Institutional Stay: Payer: Medicare Other | Admitting: Nurse Practitioner

## 2020-01-13 ENCOUNTER — Encounter: Payer: Self-pay | Admitting: Nurse Practitioner

## 2020-01-13 ENCOUNTER — Other Ambulatory Visit: Payer: Self-pay | Admitting: *Deleted

## 2020-01-13 DIAGNOSIS — K5909 Other constipation: Secondary | ICD-10-CM

## 2020-01-13 DIAGNOSIS — S2242XD Multiple fractures of ribs, left side, subsequent encounter for fracture with routine healing: Secondary | ICD-10-CM

## 2020-01-13 DIAGNOSIS — N949 Unspecified condition associated with female genital organs and menstrual cycle: Secondary | ICD-10-CM | POA: Insufficient documentation

## 2020-01-13 MED ORDER — LORAZEPAM 0.5 MG PO TABS: 0.5000 mg | ORAL_TABLET | Freq: Two times a day (BID) | ORAL | 0 refills | Status: DC

## 2020-01-13 NOTE — Assessment & Plan Note (Signed)
S/p ED eval 01/05/20, left side, no pain, SOB, or O2 desaturation. Healing nicely.

## 2020-01-13 NOTE — Assessment & Plan Note (Signed)
The right inner abia majora abrasion, the patient stated the toilet paper is not soft and injured herself, will apply Nystatin/Triamcinolone cream bid x 7 days.

## 2020-01-13 NOTE — Telephone Encounter (Signed)
Received Fax from [facility] and sent to Dr. Lyndel Safe for approval.

## 2020-01-13 NOTE — Assessment & Plan Note (Signed)
Stable, continue MiraLax, MOM, Linzess, Dulcolax.

## 2020-01-13 NOTE — Progress Notes (Signed)
Location:   [place] Room Number: [place] of Service:  ALF [id number]) Provider: Marlana Latus, NP    Patient Care Team: [name], MD as PCP - General (Internal Medicine) [name], MD (Ophthalmology) [name], MD (Psychiatry) Particia Strahm X, NP as Nurse Practitioner (Nurse Practitioner) [name], MD as Consulting Physician (Orthopedic Surgery)  Extended Emergency Contact Information Primary Emergency Contact: [name], [place] Work Phone: [phone] Relation: Son Secondary Emergency Contact: [name] [place] Phone: [phone] Relation: Sister  Code Status: DNR Goals of care: Advanced Directive information Advanced Directives 01/13/2020  Does Patient Have a Medical Advance Directive? Yes  Type of Advance Directive Living will;Healthcare Power of [place];Out of facility DNR (pink MOST or yellow form)  Does patient want to make changes to medical advance directive? No - Patient declined  Copy of [place] in Chart? Yes - validated most recent copy scanned in chart (See row information)  Pre-existing out of facility DNR order (yellow form or pink MOST form) Yellow form placed in chart (order not valid for inpatient use)     Chief Complaint  Patient presents with  . Acute Visit    Urogenital area irritation.    HPI:  Pt is a 79 y.o. female seen today for an acute visit for c/o burning on her right labia area during urination, she was noted to have the right inner labia majora abrasion, the patient stated the toilet paper is not soft and injured herself.   Recent rib fractures, healing nicely, minimal pain noted with movement or deep breathing. Chronic constipation, stable, on prn Dulcolax po/suppository, Linzess, prn MOM, MiraLax bid/prn   Past Medical History:  Diagnosis Date  . Allergy   . Anemia   . Anxiety   . Asthma   . CAD  (coronary artery disease)   . Depression   . Diverticulosis   . Dysfunction of eustachian tube   . Elevated LFTs   . Esophageal reflux   . Esophageal stricture 2002  . Hemorrhoids   . History of rectal polyps   . HLD (hyperlipidemia)   . HTN (hypertension)   . Hypertonicity of bladder   . Irritable bowel syndrome with constipation   . Obesity   . Optic neuritis   . Osteoporosis   . Peptic ulcer disease   . Peripheral neuropathy   . Trigeminal neuralgia    prior injury  . 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 Vitamin D deficiency    Past Surgical History:  Procedure Laterality Date  . ABDOMINAL HYSTERECTOMY    . ANAL RECTAL MANOMETRY N/A 07/05/2014   Procedure: ANO RECTAL MANOMETRY;  Surgeon: Leighton Ruff, MD;  Location: [facility];  Service: Endoscopy;  Laterality: N/A;  . APPENDECTOMY    . CAROTID ENDARTERECTOMY    . COLONOSCOPY  2008, 2015  . TONSILLECTOMY    . UPPER GASTROINTESTINAL ENDOSCOPY  2002    Allergies  Allergen Reactions  . Biaxin [Clarithromycin]   . Clarithromycin   . Doxycycline   . Fosamax [Alendronate Sodium]   . Geodon [Ziprasidone Hydrochloride]   . Lipitor [Atorvastatin Calcium]   . Penicillins   . Pravachol [Pravastatin Sodium]   . Statins   . Sulfonamide Derivatives   . Zithromax [Azithromycin]   . Zocor [Simvastatin]  Allergies as of 01/13/2020      Reactions   Biaxin [clarithromycin]    Clarithromycin    Doxycycline    Fosamax [alendronate Sodium]    Geodon [ziprasidone Hydrochloride]    Lipitor [atorvastatin Calcium]    Penicillins    Pravachol [pravastatin Sodium]    Statins    Sulfonamide Derivatives    Zithromax [azithromycin]    Zocor [simvastatin]       Medication List       Accurate as of January 13, 2020 11:59 PM. If you have any questions, ask your nurse or doctor.        STOP taking these medications   HYDROcodone-acetaminophen 5-325 MG  tablet Commonly known as: NORCO/VICODIN Stopped by: Karisha Marlin X Merlyn Conley, NP     TAKE these medications   Tylenol 325 MG Caps Generic drug: Acetaminophen Take 325 mg by mouth in the morning and at bedtime.   acetaminophen 325 MG tablet Commonly known as: TYLENOL Take 650 mg by mouth every 8 (eight) hours as needed. 8am & 8pm AS NEEDED FOR PAIN *NOT TO EXCEED 3,000 MG IN 24HRS*   amitriptyline 50 MG tablet Commonly known as: ELAVIL Take 50 mg by mouth at bedtime. 9pm   ARTIFICIAL TEAR OP Apply 1 drop to eye 3 (three) times daily. Both Eyes at 8am, 2pm, and 9pm.   aspirin 81 MG EC tablet Take 81 mg by mouth daily. 9am   betamethasone valerate 0.1 % cream Commonly known as: VALISONE Apply 1 application topically as needed. Apply  thin amount to vulva twice daily.   bisacodyl 10 MG suppository Commonly known as: DULCOLAX Place 10 mg rectally daily as needed for moderate constipation.   bisacodyl 5 MG EC tablet Commonly known as: DULCOLAX Take 5 mg by mouth daily as needed for moderate constipation.   camphor-menthol lotion Commonly known as: SARNA Apply topically. 0.5-0.5 % Three times a day as needed to mid back   carbamide peroxide 6.5 % OTIC solution Commonly known as: DEBROX Place 5 drops to affected ear every night for 3 days, then irrigate with bulb syringe. If not effective, notify MD.   CENTRUM SILVER PO Take 1 tablet by mouth. 8am   chlordiazePOXIDE 25 MG capsule Commonly known as: LIBRIUM Take one capsule by mouth at bedtime   famotidine 20 MG tablet Commonly known as: PEPCID Take 20 mg by mouth at bedtime. 8pm   fluPHENAZine 1 MG tablet Commonly known as: PROLIXIN Take 1 mg by mouth at bedtime. 8pm.   fluticasone 50 MCG/ACT nasal spray Commonly known as: FLONASE Place 1 spray into both nostrils 2 (two) times daily. 8am & 8pm.   furosemide 20 MG tablet Commonly known as: LASIX Take 20 mg by mouth daily. 8am   hydrocortisone 2.5 % cream Apply 1  application topically 2 (two) times daily as needed. Apply to affected areas on face.   hydrocortisone cream 1 % Apply 1 application topically 2 (two) times daily. May self apply twice daily to Hemorrhoids.   Ketoconazole-Hydrocortisone 2-2.5 % Crea Apply 1 application topically as needed. Apply to face for rash one time daily as needed.   Linzess 290 MCG Caps capsule Generic drug: linaclotide Take 290 mcg by mouth daily. Take by mouth 76minutes before breakfast on an empty stomach at 7:30am. Be sure to swallow whole   loratadine 10 MG tablet Commonly known as: CLARITIN Take 10 mg by mouth daily as needed for allergies.   LORazepam 0.5 MG tablet Commonly known as: ATIVAN Take  1 tablet (0.5 mg total) by mouth 2 (two) times daily.   magnesium hydroxide 400 MG/5ML suspension Commonly known as: MILK OF MAGNESIA Take 30 mLs by mouth daily as needed.   MIRALAX PO Take 17 g by mouth daily as needed.   polyethylene glycol 17 g packet Commonly known as: MIRALAX / GLYCOLAX Take 17 g by mouth in the morning and at bedtime.   mometasone 0.1 % cream Commonly known as: ELOCON May self administer to face twice daily as needed.   Mylanta 200-200-20 MG/5ML suspension Generic drug: alum & mag hydroxide-simeth Take 30 mLs by mouth at bedtime. 9pm   Mylanta 200-200-20 MG/5ML suspension Generic drug: alum & mag hydroxide-simeth Take 30 mLs by mouth every 4 (four) hours as needed for indigestion or heartburn.   OYSTER SHELL PO Take 250 mg by mouth in the morning and at bedtime.   saccharomyces boulardii 250 MG capsule Commonly known as: FLORASTOR Take 250 mg by mouth daily. 8am   spironolactone 25 MG tablet Commonly known as: ALDACTONE Take 12.5 mg by mouth daily.   triamcinolone cream 0.5 % Commonly known as: KENALOG Apply 1 application topically 2 (two) times daily. Apply to back for itching in the morning and evening.       Review of Systems  Constitutional: Negative for  activity change, appetite change, fatigue and fever.  HENT: Positive for hearing loss. Negative for congestion and voice change.   Eyes: Positive for visual disturbance.       Low vision  Respiratory: Negative for cough, shortness of breath and wheezing.   Cardiovascular: Positive for leg swelling. Negative for chest pain and palpitations.  Gastrointestinal: Negative for abdominal distention, abdominal pain and constipation.  Genitourinary: Positive for genital sores. Negative for difficulty urinating, dysuria, frequency, hematuria, pelvic pain, urgency, vaginal bleeding, vaginal discharge and vaginal pain.  Musculoskeletal: Positive for arthralgias, back pain and gait problem.       Left posterior chest wall, left mid and lower back, improved.   Skin: Negative for color change.  Neurological: Negative for dizziness, speech difficulty and weakness.       Memory lapses  Psychiatric/Behavioral: Negative for agitation, behavioral problems and sleep disturbance.    Immunization History  Administered Date(s) Administered  . Influenza Split 07/23/2013  . Influenza Whole 07/05/2009, 06/26/2018  . Influenza-Unspecified 07/29/2014, 06/14/2015, 07/11/2016, 07/15/2017, 06/27/2019  . Moderna SARS-COVID-2 Vaccination 09/26/2019, 10/24/2019  . PFIZER SARS-COV-2 Vaccination 09/26/2019  . PPD Test 06/17/2013  . Pneumococcal Conjugate-13 07/22/2017  . Pneumococcal Polysaccharide-23 05/05/2009  . Td 05/05/2009, 07/16/2019  . Zoster 01/09/2011   Pertinent  Health Maintenance Due  Topic Date Due  . INFLUENZA VACCINE  04/24/2020  . DEXA SCAN  Completed  . PNA vac Low Risk Adult  Completed   Fall Risk  06/10/2018 06/04/2017 07/29/2014  Falls in the past year? No Yes No  Number falls in past yr: - 1 -  Injury with Fall? - No -   Functional Status Survey:    Vitals:   01/13/20 1300  BP: 128/64  Pulse: 76  Resp: 17  Temp: (!) 97 F (36.1 C)  SpO2: 94%  Weight: 138 lb 3.2 oz (62.7 kg)  Height:  5\' 6"  (1.676 m)   Body mass index is 22.31 kg/m. Physical Exam Constitutional:      Appearance: Normal appearance.  HENT:     Head: Normocephalic.     Mouth/Throat:     Mouth: Mucous membranes are moist.  Eyes:  Extraocular Movements: Extraocular movements intact.     Conjunctiva/sclera: Conjunctivae normal.     Pupils: Pupils are equal, round, and reactive to light.     Comments: Legally blind  Cardiovascular:     Rate and Rhythm: Normal rate and regular rhythm.     Heart sounds: No murmur.  Pulmonary:     Effort: Pulmonary effort is normal.  Abdominal:     General: Bowel sounds are normal.     Palpations: Abdomen is soft.     Tenderness: There is no abdominal tenderness.  Genitourinary:    Vagina: No vaginal discharge.     Comments: External hemorrhoids, no bleeding or injury. A abrasion inner the right labia majora, no s/s of infection.  Musculoskeletal:     Cervical back: Normal range of motion and neck supple.     Right lower leg: Edema present.     Left lower leg: Edema present.     Comments: Trace edema BLE.  Skin:    General: Skin is warm and dry.  Neurological:     General: No focal deficit present.     Mental Status: She is alert. Mental status is at baseline.     Gait: Gait abnormal.     Comments: Oriented to person, place.   Psychiatric:        Mood and Affect: Mood normal.        Behavior: Behavior normal.        Thought Content: Thought content normal.     Labs reviewed: Recent Labs    06/25/19 0000 11/16/19 0000 11/24/19 0000  NA 141 138 142  K 4.2 4.8 4.6  CL 105 102 105  CO2 26 26* 23*  BUN 16 19 17   CREATININE 1.0 1.2* 1.1  CALCIUM 9.5 9.4 9.7   Recent Labs    11/16/19 0000 11/24/19 0000  AST 25 31  ALT 18 22  ALKPHOS 93 100  ALBUMIN 4.2 4.6   Recent Labs    11/16/19 0000 11/24/19 0000  WBC 7.7 6.1  NEUTROABS 3,973 2,702  HGB 12.8 14.2  HCT 38 43  PLT 324 319   Lab Results  Component Value Date   TSH 2.52  11/21/2018   Lab Results  Component Value Date   HGBA1C 5.9 02/05/2017   Lab Results  Component Value Date   CHOL 247 (A) 11/21/2018   HDL 72 (A) 11/21/2018   LDLCALC 147 11/21/2018   LDLDIRECT 190.0 03/24/2012   TRIG 150 11/21/2018   CHOLHDL 4 03/24/2012    Significant Diagnostic Results in last 30 days:  DG Ribs Unilateral W/Chest Left  Result Date: 01/05/2020 CLINICAL DATA:  79 year old female with history of trauma from a fall yesterday. Shortness of breath. Left-sided flank pain. EXAM: LEFT RIBS AND CHEST - 3+ VIEW COMPARISON:  Chest x-ray 12/31/2010. FINDINGS: Opacity at the left lung base favored to reflect subsegmental atelectasis. Right lung is clear. No pleural effusions. No evidence of pulmonary edema. Heart size is normal. Upper mediastinal contours are distorted by patient's rotation to the right. Dedicated views of the left ribs demonstrate acute nondisplaced fractures of the lateral aspects of the left sixth, seventh and eighth ribs. IMPRESSION: 1. Acute nondisplaced fractures through the lateral aspects of the left sixth, seventh and eighth ribs with low lung volumes and probable subsegmental atelectasis in the left lower lobe. Electronically Signed   By: Vinnie Langton M.D.   On: 01/05/2020 07:20   DG Lumbar Spine Complete  Result Date: 01/05/2020 CLINICAL  DATA:  Flank pain EXAM: LUMBAR SPINE - COMPLETE 4+ VIEW COMPARISON:  Abdominal CT 12/31/2016 FINDINGS: Remote L1 compression fracture with approximately 50% height loss, also seen in 2018. No visible acute fracture. Ordinary degenerative changes with mild dextroscoliosis. Osteopenia. IMPRESSION: 1. No acute finding. 2. Remote L1 compression fracture. Electronically Signed   By: Monte Fantasia M.D.   On: 01/05/2020 07:20    Assessment/Plan: Burning sensation of labia The right inner abia majora abrasion, the patient stated the toilet paper is not soft and injured herself, will apply Nystatin/Triamcinolone cream bid x  7 days.   Constipation Stable, continue MiraLax, MOM, Linzess, Dulcolax.   Multiple rib fractures S/p ED eval 01/05/20, left side, no pain, SOB, or O2 desaturation. Healing nicely.     Family/ staff Communication: plan of care reviewed with the patient and charge nurse.   Labs/tests ordered: none  Time spend 40 minutes.

## 2020-01-14 ENCOUNTER — Encounter: Payer: Self-pay | Admitting: Nurse Practitioner

## 2020-02-05 ENCOUNTER — Telehealth: Payer: Self-pay | Admitting: *Deleted

## 2020-02-05 MED ORDER — HYDROCODONE-ACETAMINOPHEN 5-325 MG PO TABS: 1.0000 | ORAL_TABLET | Freq: Every day | ORAL | 0 refills | Status: DC

## 2020-02-05 NOTE — Telephone Encounter (Signed)
She is on Norco one tab QD. This is her Old order. I will escribe

## 2020-02-05 NOTE — Telephone Encounter (Signed)
Received a fax from [facility] requesting refill on Norco.  Norco was Discontinued on 01/13/20 by [facility]  Medication Changes    HYDROcodone-Acetaminophen      (Completed Course)    (Completed Course)   Please Advise.

## 2020-02-10 ENCOUNTER — Other Ambulatory Visit: Payer: Self-pay | Admitting: *Deleted

## 2020-02-10 MED ORDER — LORAZEPAM 0.5 MG PO TABS: 0.5000 mg | ORAL_TABLET | Freq: Two times a day (BID) | ORAL | 0 refills | Status: DC

## 2020-02-10 NOTE — Telephone Encounter (Signed)
Received fax from [facility] Pended [facility] and sent to Dr. Gupta for approval.  

## 2020-02-23 ENCOUNTER — Encounter: Payer: Self-pay | Admitting: Internal Medicine

## 2020-02-23 ENCOUNTER — Non-Acute Institutional Stay: Payer: Medicare Other | Admitting: Internal Medicine

## 2020-02-23 DIAGNOSIS — R05 Cough: Secondary | ICD-10-CM | POA: Diagnosis not present

## 2020-02-23 DIAGNOSIS — R059 Cough, unspecified: Secondary | ICD-10-CM

## 2020-02-23 DIAGNOSIS — J060 Acute laryngopharyngitis: Secondary | ICD-10-CM

## 2020-02-23 NOTE — Progress Notes (Signed)
Location:   [facility] Room Number: [place] of Service:  [facility] [phone]) Provider:  Veleta Miners MD   [name], MD  Patient Care Team: [name], MD as PCP - General (Internal Medicine) [name], MD (Ophthalmology) [name], MD (Psychiatry) Mast, Man X, NP as Nurse Practitioner (Nurse Practitioner) [name], MD as Consulting Physician (Orthopedic Surgery)  Extended Emergency Contact Information Primary Emergency Contact: [name], [place] Work Phone: [phone] Relation: Son Secondary Emergency Contact: [name] [place] Phone: [phone] Relation: Sister  Code Status:  DNR Goals of care: Advanced Directive information Advanced Directives 01/13/2020  Does Patient Have a Medical Advance Directive? Yes  Type of Advance Directive Living will;Healthcare Power of [place];Out of facility DNR (pink MOST or yellow form)  Does patient want to make changes to medical advance directive? No - Patient declined  Copy of [place] in Chart? Yes - validated most recent copy scanned in chart (See row information)  Pre-existing out of facility DNR order (yellow form or pink MOST form) Yellow form placed in chart (order not valid for inpatient use)     Chief Complaint  Patient presents with  . Acute Visit    Coughing     HPI:  Pt is a 79 y.o. female seen today for an acute visit for cough and sore throat with hoarse voice.  Patient is resident of [place]. She has h/o Hypertension, Osteoarthritis, GERD , Constipation , Bilateral Visual Loss and Anxiety with Depression  Patient was seen today for cough mostly dry and sore throat with hoarse voice.  She also has some chest pain where she had a closed fracture few weeks ago after her mechanical fall.  No fever chills no shortness of breath.   Past Medical History:  Diagnosis Date  . Allergy   .  Anemia   . Anxiety   . Asthma   . CAD (coronary artery disease)   . Depression   . Diverticulosis   . Dysfunction of eustachian tube   . Elevated LFTs   . Esophageal reflux   . Esophageal stricture 2002  . Hemorrhoids   . History of rectal polyps   . HLD (hyperlipidemia)   . HTN (hypertension)   . Hypertonicity of bladder   . Irritable bowel syndrome with constipation   . Obesity   . Optic neuritis   . Osteoporosis   . Peptic ulcer disease   . Peripheral neuropathy   . Trigeminal neuralgia    prior injury  . 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 Vitamin D deficiency    Past Surgical History:  Procedure Laterality Date  . ABDOMINAL HYSTERECTOMY    . ANAL RECTAL MANOMETRY N/A 07/05/2014   Procedure: ANO RECTAL MANOMETRY;  Surgeon: Leighton Ruff, MD;  Location: [facility];  Service: Endoscopy;  Laterality: N/A;  . APPENDECTOMY    . CAROTID ENDARTERECTOMY    . COLONOSCOPY  2008, 2015  . TONSILLECTOMY    . UPPER GASTROINTESTINAL ENDOSCOPY  2002    Allergies  Allergen Reactions  . Biaxin [Clarithromycin]   . Clarithromycin   . Doxycycline   . Fosamax [Alendronate Sodium]   . Geodon [Ziprasidone Hydrochloride]   . Lipitor [Atorvastatin Calcium]   . Penicillins   . Pravachol [Pravastatin Sodium]   . Statins   .  Sulfonamide Derivatives   . Zithromax [Azithromycin]   . Zocor [Simvastatin]     Allergies as of 02/23/2020      Reactions   Biaxin [clarithromycin]    Clarithromycin    Doxycycline    Fosamax [alendronate Sodium]    Geodon [ziprasidone Hydrochloride]    Lipitor [atorvastatin Calcium]    Penicillins    Pravachol [pravastatin Sodium]    Statins    Sulfonamide Derivatives    Zithromax [azithromycin]    Zocor [simvastatin]       Medication List       Accurate as of February 23, 2020  3:37 PM. If you have any questions, ask your nurse or doctor.        Tylenol 325 MG Caps Generic drug:  Acetaminophen Take 325 mg by mouth in the morning and at bedtime.   acetaminophen 325 MG tablet Commonly known as: TYLENOL Take 650 mg by mouth every 8 (eight) hours as needed. 8am & 8pm AS NEEDED FOR PAIN *NOT TO EXCEED 3,000 MG IN 24HRS*   amitriptyline 50 MG tablet Commonly known as: ELAVIL Take 50 mg by mouth at bedtime. 9pm   ARTIFICIAL TEAR OP Apply 1 drop to eye 3 (three) times daily. Both Eyes at 8am, 2pm, and 9pm.   aspirin 81 MG EC tablet Take 81 mg by mouth daily. 9am   betamethasone valerate 0.1 % cream Commonly known as: VALISONE Apply 1 application topically as needed. Apply  thin amount to vulva twice daily.   Biofreeze 4 % Gel Generic drug: Menthol (Topical Analgesic) Apply topically in the morning, at noon, in the evening, and at bedtime. Apply to knee   bisacodyl 10 MG suppository Commonly known as: DULCOLAX Place 10 mg rectally daily as needed for moderate constipation.   bisacodyl 5 MG EC tablet Commonly known as: DULCOLAX Take 5 mg by mouth daily as needed for moderate constipation.   camphor-menthol lotion Commonly known as: SARNA Apply topically. 0.5-0.5 % Three times a day as needed to mid back   carbamide peroxide 6.5 % OTIC solution Commonly known as: DEBROX Place 5 drops to affected ear every night for 3 days, then irrigate with bulb syringe. If not effective, notify MD.   CENTRUM SILVER PO Take 1 tablet by mouth. 8am   chlordiazePOXIDE 25 MG capsule Commonly known as: LIBRIUM Take one capsule by mouth at bedtime   dextromethorphan-guaiFENesin 10-100 MG/5ML liquid Commonly known as: ROBITUSSIN-DM Take by mouth every 6 (six) hours as needed for cough.   famotidine 20 MG tablet Commonly known as: PEPCID Take 20 mg by mouth at bedtime. 8pm   fluPHENAZine 1 MG tablet Commonly known as: PROLIXIN Take 1 mg by mouth at bedtime. 8pm.   fluticasone 50 MCG/ACT nasal spray Commonly known as: FLONASE Place 1 spray into both nostrils 2  (two) times daily. 8am & 8pm.   furosemide 20 MG tablet Commonly known as: LASIX Take 20 mg by mouth daily. 8am   HYDROcodone-acetaminophen 5-325 MG tablet Commonly known as: Norco Take 1 tablet by mouth daily.   hydrocortisone 2.5 % cream Apply 1 application topically 2 (two) times daily as needed. Apply to affected areas on face.   hydrocortisone cream 1 % Apply 1 application topically 2 (two) times daily. May self apply twice daily to Hemorrhoids.   Ketoconazole-Hydrocortisone 2-2.5 % Crea Apply 1 application topically as needed. Apply to face for rash one time daily as needed.   Linzess 290 MCG Caps capsule Generic drug: linaclotide Take 290  mcg by mouth daily. Take by mouth 80minutes before breakfast on an empty stomach at 7:30am. Be sure to swallow whole   loratadine 10 MG tablet Commonly known as: CLARITIN Take 10 mg by mouth daily as needed for allergies.   LORazepam 0.5 MG tablet Commonly known as: ATIVAN Take 1 tablet (0.5 mg total) by mouth 2 (two) times daily.   magnesium hydroxide 400 MG/5ML suspension Commonly known as: MILK OF MAGNESIA Take 30 mLs by mouth daily as needed.   MIRALAX PO Take 17 g by mouth daily as needed.   polyethylene glycol 17 g packet Commonly known as: MIRALAX / GLYCOLAX Take 17 g by mouth in the morning and at bedtime.   mometasone 0.1 % cream Commonly known as: ELOCON May self administer to face twice daily as needed.   Mylanta 200-200-20 MG/5ML suspension Generic drug: alum & mag hydroxide-simeth Take 30 mLs by mouth at bedtime. 9pm   Mylanta 200-200-20 MG/5ML suspension Generic drug: alum & mag hydroxide-simeth Take 30 mLs by mouth every 4 (four) hours as needed for indigestion or heartburn.   OYSTER SHELL PO Take 250 mg by mouth in the morning and at bedtime.   saccharomyces boulardii 250 MG capsule Commonly known as: FLORASTOR Take 250 mg by mouth daily. 8am   spironolactone 25 MG tablet Commonly known as:  ALDACTONE Take 12.5 mg by mouth daily.   triamcinolone cream 0.5 % Commonly known as: KENALOG Apply 1 application topically 2 (two) times daily. Apply to back for itching in the morning and evening.       Review of Systems  Constitutional: Negative.   HENT: Positive for sore throat and voice change. Negative for postnasal drip, rhinorrhea and trouble swallowing.   Respiratory: Positive for cough. Negative for shortness of breath.   Cardiovascular: Negative.   Gastrointestinal: Negative.   Genitourinary: Negative.   Musculoskeletal: Negative.   Skin: Negative.   Neurological: Positive for weakness.  Psychiatric/Behavioral: Positive for behavioral problems and confusion. The patient is nervous/anxious.     Immunization History  Administered Date(s) Administered  . Influenza Split 07/23/2013  . Influenza Whole 07/05/2009, 06/26/2018  . Influenza-Unspecified 07/29/2014, 06/14/2015, 07/11/2016, 07/15/2017, 06/27/2019  . Moderna SARS-COVID-2 Vaccination 09/26/2019, 10/24/2019  . PFIZER SARS-COV-2 Vaccination 09/26/2019  . PPD Test 06/17/2013  . Pneumococcal Conjugate-13 07/22/2017  . Pneumococcal Polysaccharide-23 05/05/2009  . Td 05/05/2009, 07/16/2019  . Zoster 01/09/2011   Pertinent  Health Maintenance Due  Topic Date Due  . INFLUENZA VACCINE  04/24/2020  . DEXA SCAN  Completed  . PNA vac Low Risk Adult  Completed   Fall Risk  06/10/2018 06/04/2017 07/29/2014  Falls in the past year? No Yes No  Number falls in past yr: - 1 -  Injury with Fall? - No -   Functional Status Survey:    Vitals:   02/23/20 1520  BP: 110/66  Pulse: 82  Resp: 20  Temp: (!) 97.2 F (36.2 C)  SpO2: 96%  Weight: 136 lb 3.2 oz (61.8 kg)  Height: 5\' 6"  (1.676 m)   Body mass index is 21.98 kg/m. Physical Exam Vitals reviewed.  Constitutional:      Appearance: Normal appearance.  HENT:     Head: Normocephalic.     Nose: Nose normal.     Mouth/Throat:     Mouth: Mucous membranes are  moist.     Pharynx: Oropharynx is clear.     Comments: Mild  redness in Throat. No Exudate Cardiovascular:     Rate and  Rhythm: Normal rate and regular rhythm.     Pulses: Normal pulses.  Pulmonary:     Effort: Pulmonary effort is normal. No respiratory distress.     Breath sounds: Normal breath sounds. No wheezing or rales.  Abdominal:     General: Abdomen is flat. Bowel sounds are normal.     Palpations: Abdomen is soft.  Musculoskeletal:        General: No swelling.     Cervical back: Neck supple.  Skin:    General: Skin is warm.  Neurological:     General: No focal deficit present.     Mental Status: She is alert.  Psychiatric:        Mood and Affect: Mood normal.        Thought Content: Thought content normal.     Labs reviewed: Recent Labs    06/25/19 0000 11/16/19 0000 11/24/19 0000  NA 141 138 142  K 4.2 4.8 4.6  CL 105 102 105  CO2 26 26* 23*  BUN 16 19 17   CREATININE 1.0 1.2* 1.1  CALCIUM 9.5 9.4 9.7   Recent Labs    11/16/19 0000 11/24/19 0000  AST 25 31  ALT 18 22  ALKPHOS 93 100  ALBUMIN 4.2 4.6   Recent Labs    11/16/19 0000 11/24/19 0000  WBC 7.7 6.1  NEUTROABS 3,973 2,702  HGB 12.8 14.2  HCT 38 43  PLT 324 319   Lab Results  Component Value Date   TSH 2.52 11/21/2018   Lab Results  Component Value Date   HGBA1C 5.9 02/05/2017   Lab Results  Component Value Date   CHOL 247 (A) 11/21/2018   HDL 72 (A) 11/21/2018   LDLCALC 147 11/21/2018   LDLDIRECT 190.0 03/24/2012   TRIG 150 11/21/2018   CHOLHDL 4 03/24/2012    Significant Diagnostic Results in last 30 days:  No results found.  Assessment/Plan Acute Viral Laryngitis Her chest exam is negative at this time would not order a chest x-ray Would try some Mucinex Make Claritin every day Tylenol as needed Reval not better  Other Issues Depression with anxiety Follows with Psych On Librium and Prolixin Age-related osteoporosis with current pathological fracture,  initial encounter Was on Prolia before Tscore was -3.6 in 2018 Was discontinued after 3 doses It seems it was related to her Deductiblefor the Meds  Mixed hyperlipidemia Has allergies listed to Statin Constipation  on Linzess Family/ staff Communication:   Labs/tests ordered:

## 2020-02-25 ENCOUNTER — Non-Acute Institutional Stay: Payer: Medicare Other | Admitting: Nurse Practitioner

## 2020-02-25 DIAGNOSIS — I10 Essential (primary) hypertension: Secondary | ICD-10-CM | POA: Diagnosis not present

## 2020-02-25 DIAGNOSIS — I739 Peripheral vascular disease, unspecified: Secondary | ICD-10-CM

## 2020-02-25 DIAGNOSIS — J189 Pneumonia, unspecified organism: Secondary | ICD-10-CM

## 2020-02-25 DIAGNOSIS — K5909 Other constipation: Secondary | ICD-10-CM | POA: Diagnosis not present

## 2020-02-25 DIAGNOSIS — K219 Gastro-esophageal reflux disease without esophagitis: Secondary | ICD-10-CM

## 2020-02-25 DIAGNOSIS — F418 Other specified anxiety disorders: Secondary | ICD-10-CM

## 2020-02-25 DIAGNOSIS — M159 Polyosteoarthritis, unspecified: Secondary | ICD-10-CM

## 2020-02-25 NOTE — Assessment & Plan Note (Signed)
Blood pressure is controlled, continue Furosemide, Spironolactone.

## 2020-02-25 NOTE — Progress Notes (Addendum)
Location:   [facility] Room Number: [place] of Service:  [id number]) Provider: Marlana Latus NP  [name], MD  Patient Care Team: [name], MD as PCP - General (Internal Medicine) [name], MD (Ophthalmology) [name], MD (Psychiatry) ,  X, NP as Nurse Practitioner (Nurse Practitioner) [name], MD as Consulting Physician (Orthopedic Surgery)  Extended Emergency Contact Information Primary Emergency Contact: [name], [place] Work Phone: [phone] Relation: Son Secondary Emergency Contact: [name] [place] Phone: [phone] Relation: Sister  Code Status: DNR Goals of care: Advanced Directive information Advanced Directives 01/13/2020  Does Patient Have a Medical Advance Directive? Yes  Type of Advance Directive Living will;Healthcare Power of [place];Out of facility DNR (pink MOST or yellow form)  Does patient want to make changes to medical advance directive? No - Patient declined  Copy of [place] in Chart? Yes - validated most recent copy scanned in chart (See row information)  Pre-existing out of facility DNR order (yellow form or pink MOST form) Yellow form placed in chart (order not valid for inpatient use)     Chief Complaint  Patient presents with  . Acute Visit    Cough    HPI:  Pt is a 79 y.o. female seen today for an acute visit for cough so hard that she lost her voice 3 days,  denied sore throat, admitted chest discomfort, no phlegm production, Claritin seems helped some. The patient reported feeling tired, but no fever or SOB or palpitation.   Hx of Chronic constipation, stable, on prn Dulcolax po/suppository, Linzess, prn MOM, MiraLax bid/prn  Hx of HTN, on Furosemide 72m qd, Spironolactone 12.551mqd, OA on Tylenol 32567mid, Norco 5/325m63m. , GERD, stable, on Famotidine 20mg63m depression/anxiety,  on Amitriptyline 50mg 7mLibrium 25mg q64mluphenazine 1mg qd,31mrazepam 0.5mg bid 3mast Medical History:  Diagnosis Date  . Allergy   . Anemia   . Anxiety   . Asthma   . CAD (coronary artery disease)   . Depression   . Diverticulosis   . Dysfunction of eustachian tube   . Elevated LFTs   . Esophageal reflux   . Esophageal stricture 2002  . Hemorrhoids   . History of rectal polyps   . HLD (hyperlipidemia)   . HTN (hypertension)   . Hypertonicity of bladder   . Irritable bowel syndrome with constipation   . Obesity   . Optic neuritis   . Osteoporosis   . Peptic ulcer disease   . Peripheral neuropathy   . Trigeminal neuralgia    prior injury  . 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 Vitamin D deficiency    Past Surgical History:  Procedure Laterality Date  . ABDOMINAL HYSTERECTOMY    . ANAL RECTAL MANOMETRY N/A 07/05/2014   Procedure: ANO RECTAL MANOMETRY;  Surgeon: Alicia ThLeighton Ruffcation: [facility];  Service: Endoscopy;  Laterality: N/A;  . APPENDECTOMY    . CAROTID ENDARTERECTOMY    . COLONOSCOPY  2008, 2015  . TONSILLECTOMY    . UPPER GASTROINTESTINAL ENDOSCOPY  2002    Allergies  Allergen Reactions  . Biaxin [Clarithromycin]   . Clarithromycin   . Doxycycline   . Fosamax [Alendronate Sodium]   . Geodon [Ziprasidone Hydrochloride]   . Lipitor [Atorvastatin Calcium]   .  Penicillins   . Pravachol [Pravastatin Sodium]   . Statins   . Sulfonamide Derivatives   . Zithromax [Azithromycin]   . Zocor [Simvastatin]     Allergies as of 02/25/2020      Reactions   Biaxin [clarithromycin]    Clarithromycin    Doxycycline    Fosamax [alendronate Sodium]    Geodon [ziprasidone Hydrochloride]    Lipitor [atorvastatin Calcium]    Penicillins    Pravachol [pravastatin Sodium]    Statins    Sulfonamide Derivatives    Zithromax [azithromycin]    Zocor [simvastatin]       Medication List        Accurate as of February 25, 2020 11:59 PM. If you have any questions, ask your nurse or doctor.        Tylenol 325 MG Caps Generic drug: Acetaminophen Take 325 mg by mouth in the morning and at bedtime.   acetaminophen 325 MG tablet Commonly known as: TYLENOL Take 650 mg by mouth every 8 (eight) hours as needed. 8am & 8pm AS NEEDED FOR PAIN *NOT TO EXCEED 3,000 MG IN 24HRS*   amitriptyline 50 MG tablet Commonly known as: ELAVIL Take 50 mg by mouth at bedtime. 9pm   ARTIFICIAL TEAR OP Apply 1 drop to eye 3 (three) times daily. Both Eyes at 8am, 2pm, and 9pm.   aspirin 81 MG EC tablet Take 81 mg by mouth daily. 9am   betamethasone valerate 0.1 % cream Commonly known as: VALISONE Apply 1 application topically as needed. Apply  thin amount to vulva twice daily.   Biofreeze 4 % Gel Generic drug: Menthol (Topical Analgesic) Apply topically in the morning, at noon, in the evening, and at bedtime. Apply to knee   bisacodyl 10 MG suppository Commonly known as: DULCOLAX Place 10 mg rectally daily as needed for moderate constipation.   bisacodyl 5 MG EC tablet Commonly known as: DULCOLAX Take 5 mg by mouth daily as needed for moderate constipation.   camphor-menthol lotion Commonly known as: SARNA Apply topically. 0.5-0.5 % Three times a day as needed to mid back   carbamide peroxide 6.5 % OTIC solution Commonly known as: DEBROX Place 5 drops to affected ear every night for 3 days, then irrigate with bulb syringe. If not effective, notify MD.   CENTRUM SILVER PO Take 1 tablet by mouth. 8am   chlordiazePOXIDE 25 MG capsule Commonly known as: LIBRIUM Take one capsule by mouth at bedtime   dextromethorphan-guaiFENesin 10-100 MG/5ML liquid Commonly known as: ROBITUSSIN-DM Take by mouth every 6 (six) hours as needed for cough.   doxycycline 100 MG capsule Commonly known as: VIBRAMYCIN Take 100 mg by mouth 2 (two) times daily.   famotidine 20 MG tablet Commonly known  as: PEPCID Take 20 mg by mouth at bedtime. 8pm   fluPHENAZine 1 MG tablet Commonly known as: PROLIXIN Take 1 mg by mouth at bedtime. 8pm.   fluticasone 50 MCG/ACT nasal spray Commonly known as: FLONASE Place 1 spray into both nostrils 2 (two) times daily. 8am & 8pm.   furosemide 20 MG tablet Commonly known as: LASIX Take 20 mg by mouth daily. 8am   HYDROcodone-acetaminophen 5-325 MG tablet Commonly known as: Norco Take 1 tablet by mouth daily.   hydrocortisone 2.5 % cream Apply 1 application topically 2 (two) times daily as needed. Apply to affected areas on face.   hydrocortisone cream 1 % Apply 1 application topically 2 (two) times daily. May self apply twice daily to Hemorrhoids.  Ketoconazole-Hydrocortisone 2-2.5 % Crea Apply 1 application topically as needed. Apply to face for rash one time daily as needed.   Linzess 290 MCG Caps capsule Generic drug: linaclotide Take 290 mcg by mouth daily. Take by mouth 57mnutes before breakfast on an empty stomach at 7:30am. Be sure to swallow whole   loratadine 10 MG tablet Commonly known as: CLARITIN Take 10 mg by mouth in the morning and at bedtime.   LORazepam 0.5 MG tablet Commonly known as: ATIVAN Take 1 tablet (0.5 mg total) by mouth 2 (two) times daily.   magnesium hydroxide 400 MG/5ML suspension Commonly known as: MILK OF MAGNESIA Take 30 mLs by mouth daily as needed.   MIRALAX PO Take 17 g by mouth daily as needed.   polyethylene glycol 17 g packet Commonly known as: MIRALAX / GLYCOLAX Take 17 g by mouth in the morning and at bedtime.   mometasone 0.1 % cream Commonly known as: ELOCON May self administer to face twice daily as needed.   Mylanta 200-200-20 MG/5ML suspension Generic drug: alum & mag hydroxide-simeth Take 30 mLs by mouth at bedtime. 9pm   Mylanta 200-200-20 MG/5ML suspension Generic drug: alum & mag hydroxide-simeth Take 30 mLs by mouth every 4 (four) hours as needed for indigestion or  heartburn.   OYSTER SHELL PO Take 250 mg by mouth in the morning and at bedtime.   saccharomyces boulardii 250 MG capsule Commonly known as: FLORASTOR Take 250 mg by mouth 2 (two) times daily. 8am   spironolactone 25 MG tablet Commonly known as: ALDACTONE Take 12.5 mg by mouth daily.   triamcinolone cream 0.5 % Commonly known as: KENALOG Apply 1 application topically 2 (two) times daily. Apply to back for itching in the morning and evening.       Review of Systems  Constitutional: Positive for activity change, appetite change and fatigue. Negative for fever.  HENT: Positive for hearing loss and voice change. Negative for congestion, ear pain, nosebleeds, postnasal drip, rhinorrhea, sinus pressure, sinus pain and sore throat.        Hoarseness  Eyes: Positive for visual disturbance.       Low vision  Respiratory: Positive for cough. Negative for chest tightness, shortness of breath and wheezing.   Cardiovascular: Positive for leg swelling. Negative for chest pain and palpitations.  Gastrointestinal: Negative for abdominal pain and constipation.  Genitourinary: Negative for difficulty urinating, dysuria and frequency.  Musculoskeletal: Positive for arthralgias and gait problem. Negative for back pain.  Skin: Negative for color change.  Neurological: Negative for speech difficulty, weakness and headaches.       Memory lapses  Psychiatric/Behavioral: Negative for behavioral problems and sleep disturbance. The patient is not nervous/anxious.     Immunization History  Administered Date(s) Administered  . Influenza Split 07/23/2013  . Influenza Whole 07/05/2009, 06/26/2018  . Influenza-Unspecified 07/29/2014, 06/14/2015, 07/11/2016, 07/15/2017, 06/27/2019  . Moderna SARS-COVID-2 Vaccination 09/26/2019, 10/24/2019  . PFIZER SARS-COV-2 Vaccination 09/26/2019  . PPD Test 06/17/2013  . Pneumococcal Conjugate-13 07/22/2017  . Pneumococcal Polysaccharide-23 05/05/2009  . Td  05/05/2009, 07/16/2019  . Zoster 01/09/2011   Pertinent  Health Maintenance Due  Topic Date Due  . INFLUENZA VACCINE  04/24/2020  . DEXA SCAN  Completed  . PNA vac Low Risk Adult  Completed   Fall Risk  06/10/2018 06/04/2017 07/29/2014  Falls in the past year? No Yes No  Number falls in past yr: - 1 -  Injury with Fall? - No -   Functional Status Survey:  Vitals:   02/26/20 1349  BP: 124/60  Pulse: 62  Resp: 18  Temp: 98.3 F (36.8 C)  SpO2: 98%  Weight: 136 lb 3.2 oz (61.8 kg)  Height: 5' 6" (1.676 m)   Body mass index is 21.98 kg/m. Physical Exam Constitutional:      Appearance: Normal appearance.  HENT:     Head: Normocephalic.     Comments: No erythema or drainage in thorat    Nose: Nose normal.     Mouth/Throat:     Mouth: Mucous membranes are moist.  Eyes:     Extraocular Movements: Extraocular movements intact.     Conjunctiva/sclera: Conjunctivae normal.     Pupils: Pupils are equal, round, and reactive to light.     Comments: Legally blind  Cardiovascular:     Rate and Rhythm: Normal rate and regular rhythm.     Heart sounds: No murmur.  Pulmonary:     Effort: Pulmonary effort is normal.     Breath sounds: Rales present. No wheezing or rhonchi.     Comments: Left mid to lower lung rales.  Abdominal:     Palpations: Abdomen is soft.     Tenderness: There is no abdominal tenderness.  Musculoskeletal:     Cervical back: Normal range of motion and neck supple.     Right lower leg: No edema.     Left lower leg: No edema.  Skin:    General: Skin is warm and dry.  Neurological:     General: No focal deficit present.     Mental Status: She is alert. Mental status is at baseline.     Gait: Gait abnormal.     Comments: Oriented to person, place.   Psychiatric:        Mood and Affect: Mood normal.        Behavior: Behavior normal.        Thought Content: Thought content normal.     Labs reviewed: Recent Labs    06/25/19 0000 11/16/19 0000  11/24/19 0000  NA 141 138 142  K 4.2 4.8 4.6  CL 105 102 105  CO2 26 26* 23*  BUN _0 CREATININE 1.0 1.2* 1.1  CALCIUM 9.5 9.4 9.7   Recent Labs    11/16/19 0000 11/24/19 0000  AST 25 31  ALT 18 22  ALKPHOS 93 100  ALBUMIN 4.2 4.6   Recent Labs    11/16/19 0000 11/24/19 0000  WBC 7.7 6.1  NEUTROABS 3,973 2,702  HGB 12.8 14.2  HCT 38 43  PLT 324 319   Lab Results  Component Value Date   TSH 2.52 11/21/2018   Lab Results  Component Value Date   HGBA1C 5.9 02/05/2017   Lab Results  Component Value Date   CHOL 247 (A) 11/21/2018   HDL 72 (A) 11/21/2018   LDLCALC 147 11/21/2018   LDLDIRECT 190.0 03/24/2012   TRIG 150 11/21/2018   CHOLHDL 4 03/24/2012    Significant Diagnostic Results in last 30 days:  No results found.  Assessment/Plan: Pneumonia Continue Claritin, Adding Mucinex 639m bid x 5 days, Doxycycline 1075mbid x 7 days. Obtain CXR to r/o PNA, update CBC/diff, CMP/eGFR 02/26/20 mild pulmonary infiltrate left lung base compatible with PNA  Essential hypertension Blood pressure is controlled, continue Furosemide, Spironolactone.   PVD (peripheral vascular disease) (HCC) No apparent edema BLE, continue Furosemide, Spironolactone.   Constipation Stable, continue prn Dulcolax po/suppository, Linzess, prn MOM, MiraLax bid/prn  GERD (gastroesophageal reflux disease)  Stable, continue Famotidine.   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   Labs/tests ordered:  CBC/diff, CMP/eGFR, CXR  Time spend 40 minutes.

## 2020-02-25 NOTE — Assessment & Plan Note (Signed)
No apparent edema BLE, continue Furosemide, Spironolactone.

## 2020-02-25 NOTE — Assessment & Plan Note (Signed)
Stable, continue Tylenol, Norco 

## 2020-02-25 NOTE — Assessment & Plan Note (Addendum)
Continue Claritin, Adding Mucinex 623m bid x 5 days, Doxycycline 1041mbid x 7 days. Obtain CXR to r/o PNA, update CBC/diff, CMP/eGFR 02/26/20 mild pulmonary infiltrate left lung base compatible with PNA

## 2020-02-25 NOTE — Assessment & Plan Note (Signed)
Stable, continue Famotidine.  

## 2020-02-25 NOTE — Assessment & Plan Note (Signed)
Her mood is stable, continue Amitriptyline 50mg  qd, Librium 25mg  qd, Fluphenazine 1mg  qd, Lorazepam 0.5mg  bid

## 2020-02-25 NOTE — Assessment & Plan Note (Signed)
Stable, continue prn Dulcolax po/suppository, Linzess, prn MOM, MiraLax bid/prn

## 2020-02-26 ENCOUNTER — Encounter: Payer: Self-pay | Admitting: Nurse Practitioner

## 2020-03-02 LAB — COMPREHENSIVE METABOLIC PANEL
Albumin: 4.2 (ref 3.5–5.0)
Calcium: 9.7 (ref 8.7–10.7)
GFR calc Af Amer: 62
GFR calc non Af Amer: 54
Globulin: 2.4

## 2020-03-02 LAB — BASIC METABOLIC PANEL
BUN: 18 (ref 4–21)
CO2: 25 — AB (ref 13–22)
Chloride: 105 (ref 99–108)
Creatinine: 1 (ref 0.5–1.1)
Glucose: 89
Potassium: 4.4 (ref 3.4–5.3)
Sodium: 139 (ref 137–147)

## 2020-03-02 LAB — HEPATIC FUNCTION PANEL
ALT: 13 (ref 7–35)
AST: 21 (ref 13–35)
Alkaline Phosphatase: 111 (ref 25–125)
Bilirubin, Total: 0.4

## 2020-03-02 LAB — CBC AND DIFFERENTIAL
HCT: 37 (ref 36–46)
Hemoglobin: 12.6 (ref 12.0–16.0)
Neutrophils Absolute: 2436
Platelets: 333 (ref 150–399)
WBC: 5.8

## 2020-03-02 LAB — CBC: RBC: 4.04 (ref 3.87–5.11)

## 2020-03-03 ENCOUNTER — Other Ambulatory Visit: Payer: Self-pay | Admitting: *Deleted

## 2020-03-03 MED ORDER — HYDROCODONE-ACETAMINOPHEN 5-325 MG PO TABS: 1.0000 | ORAL_TABLET | Freq: Every day | ORAL | 0 refills | Status: DC

## 2020-03-03 NOTE — Telephone Encounter (Signed)
Received refill Request from [facility] Pended [facility] and sent to Dr. Gupta for approval.  

## 2020-04-05 ENCOUNTER — Other Ambulatory Visit: Payer: Self-pay

## 2020-04-05 MED ORDER — HYDROCODONE-ACETAMINOPHEN 5-325 MG PO TABS: 1.0000 | ORAL_TABLET | Freq: Every day | ORAL | 0 refills | Status: DC

## 2020-04-05 NOTE — Telephone Encounter (Signed)
Refill request received from [facility] for Hydrocodone 5/325 mg tablet take one tablet daily. Pended and routed to Dr. Lyndel Safe for approval.

## 2020-04-08 ENCOUNTER — Other Ambulatory Visit: Payer: Self-pay | Admitting: *Deleted

## 2020-04-08 MED ORDER — LORAZEPAM 0.5 MG PO TABS: 0.5000 mg | ORAL_TABLET | Freq: Two times a day (BID) | ORAL | 0 refills | Status: DC

## 2020-04-08 NOTE — Telephone Encounter (Signed)
Received refill request Pended Rx and sent to Dr. Gupta for approval.  

## 2020-04-15 ENCOUNTER — Other Ambulatory Visit: Payer: Self-pay | Admitting: *Deleted

## 2020-04-15 MED ORDER — CHLORDIAZEPOXIDE HCL 25 MG PO CAPS: ORAL_CAPSULE | ORAL | 0 refills | Status: DC

## 2020-04-15 NOTE — Telephone Encounter (Signed)
Received fax from [facility] °Pended Rx and sent to [facility] for approval.  °

## 2020-05-09 ENCOUNTER — Other Ambulatory Visit: Payer: Self-pay

## 2020-05-09 MED ORDER — CHLORDIAZEPOXIDE HCL 25 MG PO CAPS: ORAL_CAPSULE | ORAL | 0 refills | Status: DC

## 2020-05-09 MED ORDER — HYDROCODONE-ACETAMINOPHEN 5-325 MG PO TABS: 1.0000 | ORAL_TABLET | Freq: Every day | ORAL | 0 refills | Status: DC

## 2020-05-13 ENCOUNTER — Other Ambulatory Visit: Payer: Self-pay | Admitting: *Deleted

## 2020-05-13 MED ORDER — LORAZEPAM 0.5 MG PO TABS: 0.5000 mg | ORAL_TABLET | Freq: Two times a day (BID) | ORAL | 0 refills | Status: DC

## 2020-05-13 NOTE — Telephone Encounter (Signed)
Received fax refill Pended Rx and sent to Dr. Lyndel Safe for approval.

## 2020-06-01 ENCOUNTER — Non-Acute Institutional Stay: Payer: Medicare Other | Admitting: Nurse Practitioner

## 2020-06-01 ENCOUNTER — Encounter: Payer: Self-pay | Admitting: Nurse Practitioner

## 2020-06-01 DIAGNOSIS — M159 Polyosteoarthritis, unspecified: Secondary | ICD-10-CM

## 2020-06-01 DIAGNOSIS — K219 Gastro-esophageal reflux disease without esophagitis: Secondary | ICD-10-CM

## 2020-06-01 DIAGNOSIS — I10 Essential (primary) hypertension: Secondary | ICD-10-CM

## 2020-06-01 DIAGNOSIS — J3089 Other allergic rhinitis: Secondary | ICD-10-CM

## 2020-06-01 DIAGNOSIS — R634 Abnormal weight loss: Secondary | ICD-10-CM | POA: Diagnosis not present

## 2020-06-01 DIAGNOSIS — J302 Other seasonal allergic rhinitis: Secondary | ICD-10-CM

## 2020-06-01 DIAGNOSIS — F418 Other specified anxiety disorders: Secondary | ICD-10-CM

## 2020-06-01 DIAGNOSIS — K5909 Other constipation: Secondary | ICD-10-CM

## 2020-06-01 NOTE — Assessment & Plan Note (Addendum)
Still constipated, continue prn Dulcolax po/suppository, Linzess, prn MOM, the patient wants to try MiraLax 2 capful in am, 1 capful at night.

## 2020-06-01 NOTE — Assessment & Plan Note (Signed)
Stable, psych f/u, last seen 03/15/20, continue Amitriptyline 50mg  qd, Librium 25mg  qd, Fluphenazine 1mg  qd, Lorazepam 0.5mg  bid

## 2020-06-01 NOTE — Assessment & Plan Note (Signed)
Pain is controlled, continue Tylenol 325mg  bid, Norco 5/325mg  qd.

## 2020-06-01 NOTE — Assessment & Plan Note (Signed)
Stable, continue Famotidine.  

## 2020-06-01 NOTE — Assessment & Plan Note (Signed)
Stable, continue Claritin, Flonase.

## 2020-06-01 NOTE — Progress Notes (Signed)
Location:   [facility] Number: [place] of Service:  ALF [phone]) Provider:  Marlana Latus, NP  [name], MD  Patient Care Team: [name], MD as PCP - General (Internal Medicine) [name], MD (Ophthalmology) [name], MD (Psychiatry) Chrishawn Boley X, NP as Nurse Practitioner (Nurse Practitioner) [name], MD as Consulting Physician (Orthopedic Surgery)  Extended Emergency Contact Information Primary Emergency Contact: [name], [place] of [place] Work Phone: [phone] Relation: Son Secondary Emergency Contact: [name] [place] of [place] Phone: [phone] Relation: Sister  Code Status:  DNR Goals of care: Advanced Directive information Advanced Directives 06/01/2020  Does Patient Have a Medical Advance Directive? Yes  Type of Paramedic of [place];Living will;Out of facility DNR (pink MOST or yellow form)  Does patient want to make changes to medical advance directive? No - Patient declined  Copy of [place] in Chart? Yes - validated most recent copy scanned in chart (See row information)  Pre-existing out of facility DNR order (yellow form or pink MOST form) Yellow form placed in chart (order not valid for inpatient use)     Chief Complaint  Patient presents with  . Medical Management of Chronic Issues    Routine follow up   . Best Practice Recommendations    Flu vaccine    HPI:  Pt is a 79 y.o. female seen today for medical management of chronic diseases.      HTN, on Furosemide 20mg  qd, Spironolactone 12.5mg  qd  OA on Tylenol 325mg  bid, Norco 5/325mg  qd.  GERD, stable, on Famotidine 20mg  qd.  4 Depression/anxiety, on Amitriptyline 50mg  qd, Librium 25mg  qd, Fluphenazine 1mg  qd, Lorazepam 0.5mg  bid  Chronic constipation, on prn Dulcolax po/suppository, Linzess, prn MOM, MiraLax bid/prn  Allergic rhinitis, takes  Claritin, Flonase.   Past Medical History:  Diagnosis Date  . Allergy   . Anemia   . Anxiety   . Asthma   . CAD (coronary artery disease)   . Depression   . Diverticulosis   . Dysfunction of eustachian tube   . Elevated LFTs   . Esophageal reflux   . Esophageal stricture 2002  . Hemorrhoids   . History of rectal polyps   . HLD (hyperlipidemia)   . HTN (hypertension)   . Hypertonicity of bladder   . Irritable bowel syndrome with constipation   . Obesity   . Optic neuritis   . Osteoporosis   . Peptic ulcer disease   . Peripheral neuropathy   . Trigeminal neuralgia    prior injury  . 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 Vitamin D deficiency    Past Surgical History:  Procedure Laterality Date  . ABDOMINAL HYSTERECTOMY    . ANAL RECTAL MANOMETRY N/A 07/05/2014   Procedure: ANO RECTAL MANOMETRY;  Surgeon: Leighton Ruff, MD;  Location: [facility];  Service: Endoscopy;  Laterality: N/A;  . APPENDECTOMY    . CAROTID ENDARTERECTOMY    . COLONOSCOPY  2008, 2015  . TONSILLECTOMY    . UPPER GASTROINTESTINAL ENDOSCOPY  2002    Allergies  Allergen Reactions  . Biaxin [Clarithromycin]   . Clarithromycin   . Doxycycline   . Fosamax [Alendronate Sodium]   . Geodon [Ziprasidone Hydrochloride]   . Lipitor [Atorvastatin Calcium]   . Penicillins   . Pravachol [Pravastatin Sodium]   .  Statins   . Sulfonamide Derivatives   . Zithromax [Azithromycin]   . Zocor [Simvastatin]     Allergies as of 06/01/2020      Reactions   Biaxin [clarithromycin]    Clarithromycin    Doxycycline    Fosamax [alendronate Sodium]    Geodon [ziprasidone Hydrochloride]    Lipitor [atorvastatin Calcium]    Penicillins    Pravachol [pravastatin Sodium]    Statins    Sulfonamide Derivatives    Zithromax [azithromycin]    Zocor [simvastatin]       Medication List       Accurate as of June 01, 2020 11:59 PM. If you have any  questions, ask your nurse or doctor.        STOP taking these medications   dextromethorphan-guaiFENesin 10-100 MG/5ML liquid Commonly known as: ROBITUSSIN-DM Stopped by: Jakyle Petrucelli X Jestina Stephani, NP     TAKE these medications   Tylenol 325 MG Caps Generic drug: Acetaminophen Take 325 mg by mouth in the morning and at bedtime.   acetaminophen 325 MG tablet Commonly known as: TYLENOL Take 650 mg by mouth every 8 (eight) hours as needed. 8am & 8pm AS NEEDED FOR PAIN *NOT TO EXCEED 3,000 MG IN 24HRS*   amitriptyline 50 MG tablet Commonly known as: ELAVIL Take 50 mg by mouth at bedtime. 9pm   ARTIFICIAL TEAR OP Apply 1 drop to eye 3 (three) times daily. Both Eyes at 8am, 2pm, and 9pm.   aspirin 81 MG EC tablet Take 81 mg by mouth daily. 9am   betamethasone valerate 0.1 % cream Commonly known as: VALISONE Apply 1 application topically as needed. Apply  thin amount to vulva twice daily.   Biofreeze 4 % Gel Generic drug: Menthol (Topical Analgesic) Apply topically in the morning, at noon, in the evening, and at bedtime. Apply to knee   bisacodyl 10 MG suppository Commonly known as: DULCOLAX Place 10 mg rectally daily as needed for moderate constipation.   bisacodyl 5 MG EC tablet Commonly known as: DULCOLAX Take 5 mg by mouth daily as needed for moderate constipation.   camphor-menthol lotion Commonly known as: SARNA Apply topically. 0.5-0.5 % Three times a day as needed to mid back   carbamide peroxide 6.5 % OTIC solution Commonly known as: DEBROX Place 5 drops to affected ear every night for 3 days, then irrigate with bulb syringe. If not effective, notify MD.   CENTRUM SILVER PO Take 1 tablet by mouth. 8am   chlordiazePOXIDE 25 MG capsule Commonly known as: LIBRIUM Take one capsule by mouth at bedtime   famotidine 20 MG tablet Commonly known as: PEPCID Take 20 mg by mouth at bedtime. 8pm   fluPHENAZine 1 MG tablet Commonly known as: PROLIXIN Take 1 mg by mouth at  bedtime. 8pm.   fluticasone 50 MCG/ACT nasal spray Commonly known as: FLONASE Place 1 spray into both nostrils 2 (two) times daily. 8am & 8pm.   furosemide 20 MG tablet Commonly known as: LASIX Take 20 mg by mouth daily. 8am   guaiFENesin 600 MG 12 hr tablet Commonly known as: MUCINEX Take 600 mg by mouth 2 (two) times daily. (1) tab, oral, As Needed, Administer med as needed for cold/congestion.   HYDROcodone-acetaminophen 5-325 MG tablet Commonly known as: Norco Take 1 tablet by mouth daily.   hydrocortisone 2.5 % cream Apply 1 application topically 2 (two) times daily as needed. Apply to affected areas on face.   hydrocortisone cream 1 % Apply 1 application topically 2 (two)  times daily. May self apply twice daily to Hemorrhoids.   Ketoconazole-Hydrocortisone 2-2.5 % Crea Apply 1 application topically as needed. Apply to face for rash one time daily as needed.   Linzess 290 MCG Caps capsule Generic drug: linaclotide Take 290 mcg by mouth daily. Take by mouth 30minutes before breakfast on an empty stomach at 7:30am. Be sure to swallow whole   loratadine 10 MG tablet Commonly known as: CLARITIN Take 10 mg by mouth in the morning and at bedtime.   LORazepam 0.5 MG tablet Commonly known as: ATIVAN Take 1 tablet (0.5 mg total) by mouth 2 (two) times daily.   magnesium hydroxide 400 MG/5ML suspension Commonly known as: MILK OF MAGNESIA Take 30 mLs by mouth daily as needed.   MIRALAX PO Take 17 g by mouth daily as needed.   polyethylene glycol 17 g packet Commonly known as: MIRALAX / GLYCOLAX Take 17 g by mouth in the morning and at bedtime.   mometasone 0.1 % cream Commonly known as: ELOCON May self administer to face twice daily as needed.   Mylanta 200-200-20 MG/5ML suspension Generic drug: alum & mag hydroxide-simeth Take 30 mLs by mouth at bedtime. 9pm   Mylanta 200-200-20 MG/5ML suspension Generic drug: alum & mag hydroxide-simeth Take 30 mLs by mouth  every 4 (four) hours as needed for indigestion or heartburn.   OYSTER SHELL PO Take 250 mg by mouth in the morning and at bedtime.   saccharomyces boulardii 250 MG capsule Commonly known as: FLORASTOR Take 250 mg by mouth 2 (two) times daily. 8am   spironolactone 25 MG tablet Commonly known as: ALDACTONE Take 12.5 mg by mouth daily.   triamcinolone cream 0.5 % Commonly known as: KENALOG Apply 1 application topically 2 (two) times daily. Apply to back for itching in the morning and evening.       Review of Systems  Constitutional: Positive for unexpected weight change. Negative for fatigue and fever.       Weight loss #6Ibs in 3 months.   HENT: Positive for hearing loss. Negative for congestion, rhinorrhea and voice change.        Hoarseness  Eyes: Positive for visual disturbance.       Low vision  Respiratory: Negative for cough, shortness of breath and wheezing.   Cardiovascular: Negative for chest pain, palpitations and leg swelling.  Gastrointestinal: Positive for constipation. Negative for abdominal pain, nausea and vomiting.  Genitourinary: Negative for difficulty urinating, dysuria and frequency.  Musculoskeletal: Positive for arthralgias and gait problem. Negative for back pain.       OA is under control  Skin: Negative for color change.  Neurological: Negative for speech difficulty, weakness and light-headedness.       Memory lapses  Psychiatric/Behavioral: Negative for behavioral problems and sleep disturbance. The patient is not nervous/anxious.     Immunization History  Administered Date(s) Administered  . Influenza Split 07/23/2013  . Influenza Whole 07/05/2009, 06/26/2018  . Influenza-Unspecified 07/29/2014, 06/14/2015, 07/11/2016, 07/15/2017, 06/27/2019  . Moderna SARS-COVID-2 Vaccination 09/26/2019, 10/24/2019  . PFIZER SARS-COV-2 Vaccination 09/26/2019  . PPD Test 06/17/2013  . Pneumococcal Conjugate-13 07/22/2017  . Pneumococcal Polysaccharide-23  05/05/2009  . Td 05/05/2009, 07/16/2019  . Zoster 01/09/2011   Pertinent  Health Maintenance Due  Topic Date Due  . INFLUENZA VACCINE  04/24/2020  . DEXA SCAN  Completed  . PNA vac Low Risk Adult  Completed   Fall Risk  06/10/2018 06/04/2017 07/29/2014  Falls in the past year? No Yes No  Number  falls in past yr: - 1 -  Injury with Fall? - No -   Functional Status Survey:    Vitals:   06/01/20 1411  BP: 120/60  Pulse: 64  Resp: 18  Temp: (!) 96.6 F (35.9 C)  SpO2: 99%  Weight: 130 lb (59 kg)  Height: 5\' 6"  (1.676 m)   Body mass index is 20.98 kg/m. Physical Exam Constitutional:      Appearance: Normal appearance.  HENT:     Head: Normocephalic.     Comments: No erythema or drainage in thorat    Nose: Nose normal.     Mouth/Throat:     Mouth: Mucous membranes are moist.  Eyes:     Extraocular Movements: Extraocular movements intact.     Conjunctiva/sclera: Conjunctivae normal.     Pupils: Pupils are equal, round, and reactive to light.     Comments: Legally blind  Cardiovascular:     Rate and Rhythm: Normal rate and regular rhythm.     Heart sounds: No murmur heard.   Pulmonary:     Effort: Pulmonary effort is normal.     Breath sounds: No wheezing, rhonchi or rales.  Abdominal:     General: Bowel sounds are normal. There is no distension.     Palpations: Abdomen is soft.     Tenderness: There is no abdominal tenderness. There is no right CVA tenderness, left CVA tenderness or guarding.  Musculoskeletal:     Cervical back: Normal range of motion and neck supple.     Right lower leg: No edema.     Left lower leg: No edema.  Skin:    General: Skin is warm and dry.  Neurological:     General: No focal deficit present.     Mental Status: She is alert. Mental status is at baseline.     Gait: Gait abnormal.     Comments: Oriented to person, place.   Psychiatric:        Mood and Affect: Mood normal.        Behavior: Behavior normal.        Thought Content:  Thought content normal.     Labs reviewed: Recent Labs    11/16/19 0000 11/24/19 0000 03/02/20 0000  NA 138 142 139  K 4.8 4.6 4.4  CL 102 105 105  CO2 26* 23* 25*  BUN 19 17 18   CREATININE 1.2* 1.1 1.0  CALCIUM 9.4 9.7 9.7   Recent Labs    11/16/19 0000 11/24/19 0000 03/02/20 0000  AST 25 31 21   ALT 18 22 13   ALKPHOS 93 100 111  ALBUMIN 4.2 4.6 4.2   Recent Labs    11/16/19 0000 11/24/19 0000 03/02/20 0000  WBC 7.7 6.1 5.8  NEUTROABS 3,973 2,702 2,436  HGB 12.8 14.2 12.6  HCT 38 43 37  PLT 324 319 333   Lab Results  Component Value Date   TSH 2.52 11/21/2018   Lab Results  Component Value Date   HGBA1C 5.9 02/05/2017   Lab Results  Component Value Date   CHOL 247 (A) 11/21/2018   HDL 72 (A) 11/21/2018   LDLCALC 147 11/21/2018   LDLDIRECT 190.0 03/24/2012   TRIG 150 11/21/2018   CHOLHDL 4 03/24/2012    Significant Diagnostic Results in last 30 days:  No results found.  Assessment/Plan Weight loss About #6 Ibs in the past 3 month, f/u dietitian.   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   GERD (gastroesophageal reflux disease) Stable, continue Famotidine.   Depression with anxiety Stable, psych f/u, last seen 03/15/20, continue Amitriptyline 50mg  qd, Librium 25mg  qd, Fluphenazine 1mg  qd, Lorazepam 0.5mg  bid   Constipation Still constipated, continue prn Dulcolax po/suppository, Linzess, prn MOM, the patient wants to try MiraLax 2 capful in am, 1 capful at night.    Seasonal and perennial allergic rhinitis Stable, continue Claritin, Flonase.      Family/ staff Communication: plan of care reviewed with the patient and charge nurse.   Labs/tests ordered: none  Time spend 35 minutes.

## 2020-06-01 NOTE — Assessment & Plan Note (Signed)
About #6 Ibs in the past 3 month, f/u dietitian.

## 2020-06-01 NOTE — Assessment & Plan Note (Signed)
Blood pressure is controlled, continue Furosemide 20mg  qd, Spironolactone 12.5mg  qd

## 2020-06-02 ENCOUNTER — Encounter: Payer: Self-pay | Admitting: Nurse Practitioner

## 2020-06-07 ENCOUNTER — Other Ambulatory Visit: Payer: Self-pay | Admitting: *Deleted

## 2020-06-07 MED ORDER — HYDROCODONE-ACETAMINOPHEN 5-325 MG PO TABS: 1.0000 | ORAL_TABLET | Freq: Every day | ORAL | 0 refills | Status: DC

## 2020-06-07 NOTE — Telephone Encounter (Signed)
Received fax from [facility] Pended [facility] and sent to Dr. Gupta for approval.  

## 2020-06-10 ENCOUNTER — Other Ambulatory Visit: Payer: Self-pay | Admitting: *Deleted

## 2020-06-10 MED ORDER — LORAZEPAM 0.5 MG PO TABS: 0.5000 mg | ORAL_TABLET | Freq: Two times a day (BID) | ORAL | 0 refills | Status: DC

## 2020-06-10 NOTE — Telephone Encounter (Signed)
[facility] Faxed refill Request Pended Rx and sent to Dr. Lyndel Safe for approval.

## 2020-06-17 ENCOUNTER — Other Ambulatory Visit: Payer: Self-pay | Admitting: *Deleted

## 2020-06-17 MED ORDER — CHLORDIAZEPOXIDE HCL 25 MG PO CAPS: ORAL_CAPSULE | ORAL | 0 refills | Status: DC

## 2020-06-17 NOTE — Telephone Encounter (Signed)
Received refill Request from [facility] Pended [facility] and sent to Dr. Gupta for approval.  

## 2020-07-18 ENCOUNTER — Other Ambulatory Visit: Payer: Self-pay | Admitting: *Deleted

## 2020-07-18 MED ORDER — CHLORDIAZEPOXIDE HCL 25 MG PO CAPS: ORAL_CAPSULE | ORAL | 2 refills | Status: DC

## 2020-07-18 NOTE — Telephone Encounter (Signed)
Received refill Request from [facility] Pended [facility] and sent to Dr. Gupta for approval.  

## 2020-08-03 ENCOUNTER — Other Ambulatory Visit: Payer: Self-pay

## 2020-08-03 MED ORDER — HYDROCODONE-ACETAMINOPHEN 5-325 MG PO TABS: 1.0000 | ORAL_TABLET | Freq: Every day | ORAL | 0 refills | Status: DC

## 2020-08-03 NOTE — Telephone Encounter (Signed)
Refill request received from [name] for Norco 5/325 mg tablet one tablet daily in the morning. Medication pended and sent to D. Lyndel Safe for approval.

## 2020-09-02 ENCOUNTER — Other Ambulatory Visit: Payer: Self-pay | Admitting: *Deleted

## 2020-09-02 MED ORDER — HYDROCODONE-ACETAMINOPHEN 5-325 MG PO TABS: 1.0000 | ORAL_TABLET | Freq: Every day | ORAL | 0 refills | Status: DC

## 2020-09-02 NOTE — Telephone Encounter (Signed)
Received refill request Pended Rx and sent to Dr. Lyndel Safe for approval.

## 2020-09-06 ENCOUNTER — Other Ambulatory Visit: Payer: Self-pay | Admitting: *Deleted

## 2020-09-06 MED ORDER — LORAZEPAM 0.5 MG PO TABS: 0.5000 mg | ORAL_TABLET | Freq: Two times a day (BID) | ORAL | 0 refills | Status: DC

## 2020-09-06 MED ORDER — CHLORDIAZEPOXIDE HCL 25 MG PO CAPS: ORAL_CAPSULE | ORAL | 2 refills | Status: DC

## 2020-09-06 NOTE — Telephone Encounter (Signed)
Received refill Request from [facility] and sent to Dr. Lyndel Safe for approval.

## 2020-10-04 ENCOUNTER — Encounter: Payer: Self-pay | Admitting: Internal Medicine

## 2020-10-04 ENCOUNTER — Non-Acute Institutional Stay: Payer: Medicare Other | Admitting: Internal Medicine

## 2020-10-04 DIAGNOSIS — K219 Gastro-esophageal reflux disease without esophagitis: Secondary | ICD-10-CM | POA: Diagnosis not present

## 2020-10-04 DIAGNOSIS — I10 Essential (primary) hypertension: Secondary | ICD-10-CM

## 2020-10-04 DIAGNOSIS — F418 Other specified anxiety disorders: Secondary | ICD-10-CM | POA: Diagnosis not present

## 2020-10-04 DIAGNOSIS — K5909 Other constipation: Secondary | ICD-10-CM

## 2020-10-04 DIAGNOSIS — M159 Polyosteoarthritis, unspecified: Secondary | ICD-10-CM

## 2020-10-04 DIAGNOSIS — M8000XA Age-related osteoporosis with current pathological fracture, unspecified site, initial encounter for fracture: Secondary | ICD-10-CM

## 2020-10-04 DIAGNOSIS — E782 Mixed hyperlipidemia: Secondary | ICD-10-CM

## 2020-10-04 NOTE — Progress Notes (Signed)
Location:    [place] Room [place]: [place]  [facility] [phone]) Provider:  Veleta Miners MD  [name], MD  Patient Care Team: [name], MD as PCP - General (Internal Medicine) [name], MD (Ophthalmology) [name], MD (Psychiatry) Mast, Man X, NP as Nurse Practitioner (Nurse Practitioner) [name], MD as Consulting Physician (Orthopedic Surgery)  Extended Emergency Contact Information Primary Emergency Contact: [name], [place] Work Phone: [phone] Relation: Son Secondary Emergency Contact: [name] [place] Phone: [phone] Relation: Sister  Code Status:  DNR Goals of care: Advanced Directive information Advanced Directives 10/04/2020  Does Patient Have a Medical Advance Directive? Yes  Type of Paramedic of [place];Living will;Out of facility DNR (pink MOST or yellow form)  Does patient want to make changes to medical advance directive? No - Patient declined  Copy of [place] in Chart? Yes - validated most recent copy scanned in chart (See row information)  Pre-existing out of facility DNR order (yellow form or pink MOST form) Yellow form placed in chart (order not valid for inpatient use)     Chief Complaint  Patient presents with  . Medical Management of Chronic Issues    HPI:  Pt is a 80 y.o. female seen today for medical management of chronic diseases.     [place] of [place]  She has h/o Hypertension, Osteoarthritis, GERD , Constipation , Bilateral Visual Loss and  Anxiety with Depression  Seen With the Nurse Patient was very happy today as she has her son visiting from [place] Has no Acute complains Wanted to know if she can see Dermatology who comes to facility for rash below her eye. Not responded to the cream prescribed by [place]  Has lost some weight but appetite is  good Walks with no assist No Nursing issues   Past Medical History:  Diagnosis Date  . Allergy   . Anemia   . Anxiety   . Asthma   . CAD (coronary artery disease)   . Depression   . Diverticulosis   . Dysfunction of eustachian tube   . Elevated LFTs   . Esophageal reflux   . Esophageal stricture 2002  . Hemorrhoids   . History of rectal polyps   . HLD (hyperlipidemia)   . HTN (hypertension)   . Hypertonicity of bladder   . Irritable bowel syndrome with constipation   . Obesity   . Optic neuritis   . Osteoporosis   . Peptic ulcer disease   . Peripheral neuropathy   . Trigeminal neuralgia    prior injury  . 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 Vitamin D deficiency    Past Surgical History:  Procedure Laterality Date  . ABDOMINAL HYSTERECTOMY    . ANAL RECTAL MANOMETRY N/A 07/05/2014   Procedure: ANO RECTAL MANOMETRY;  Surgeon: Leighton Ruff, MD;  Location: [facility];  Service: Endoscopy;  Laterality: N/A;  . APPENDECTOMY    . CAROTID ENDARTERECTOMY    . COLONOSCOPY  2008, 2015  . TONSILLECTOMY    . UPPER GASTROINTESTINAL ENDOSCOPY  2002    Allergies  Allergen Reactions  . Biaxin [Clarithromycin]   . Clarithromycin   . Doxycycline   . Fosamax [Alendronate Sodium]   . Geodon [Ziprasidone Hydrochloride]   . Lipitor [employer]  Calcium]   . Penicillins   . Pravachol [Pravastatin Sodium]   . Statins   . Sulfonamide Derivatives   . Zithromax [Azithromycin]   . Zocor [Simvastatin]     Allergies as of 10/04/2020      Reactions   Biaxin [clarithromycin]    Clarithromycin    Doxycycline    Fosamax [alendronate Sodium]    Geodon [ziprasidone Hydrochloride]    Lipitor [atorvastatin Calcium]    Penicillins    Pravachol [pravastatin Sodium]    Statins    Sulfonamide Derivatives    Zithromax [azithromycin]    Zocor [simvastatin]       Medication List       Accurate as of October 04, 2020  9:32 AM. If you have any questions, ask your nurse or doctor.        Tylenol 325 MG Caps Generic drug: Acetaminophen Take 325 mg by mouth in the morning and at bedtime.   acetaminophen 325 MG tablet Commonly known as: TYLENOL Take 650 mg by mouth every 8 (eight) hours as needed. 8am & 8pm AS NEEDED FOR PAIN *NOT TO EXCEED 3,000 MG IN 24HRS*   alum & mag hydroxide-simeth 557-322-02 MG/5ML suspension Commonly known as: MAALOX/MYLANTA Take 30 mLs by mouth at bedtime. 9pm   alum & mag hydroxide-simeth 200-200-20 MG/5ML suspension Commonly known as: MAALOX/MYLANTA Take 30 mLs by mouth every 4 (four) hours as needed for indigestion or heartburn.   amitriptyline 50 MG tablet Commonly known as: ELAVIL Take 50 mg by mouth at bedtime. 9pm   ARTIFICIAL TEAR OP Apply 1 drop to eye 3 (three) times daily. Both Eyes at 8am, 2pm, and 9pm.   aspirin 81 MG EC tablet Take 81 mg by mouth daily. 9am   betamethasone valerate 0.1 % cream Commonly known as: VALISONE Apply 1 application topically as needed. Apply  thin amount to vulva twice daily.   Biofreeze 4 % Gel Generic drug: Menthol (Topical Analgesic) Apply topically in the morning, at noon, in the evening, and at bedtime. Apply to knee   bisacodyl 10 MG suppository Commonly known as: DULCOLAX Place 10 mg rectally daily as needed for moderate constipation.   bisacodyl 5 MG EC tablet Commonly known as: DULCOLAX Take 5 mg by mouth daily as needed for moderate constipation.   camphor-menthol lotion Commonly known as: SARNA Apply topically. 0.5-0.5 % Three times a day as needed to mid back   carbamide peroxide 6.5 % OTIC solution Commonly known as: DEBROX Place 5 drops to affected ear every night for 3 days, then irrigate with bulb syringe. If not effective, notify MD.   CENTRUM SILVER PO Take 1 tablet by mouth. 8am   chlordiazePOXIDE 25 MG capsule Commonly known as: LIBRIUM Take one capsule by mouth at bedtime    famotidine 20 MG tablet Commonly known as: PEPCID Take 20 mg by mouth at bedtime. 8pm   fluPHENAZine 1 MG tablet Commonly known as: PROLIXIN Take 1 mg by mouth at bedtime. 8pm.   fluticasone 50 MCG/ACT nasal spray Commonly known as: FLONASE Place 1 spray into both nostrils 2 (two) times daily. 8am & 8pm.   furosemide 20 MG tablet Commonly known as: LASIX Take 20 mg by mouth daily. 8am   guaiFENesin 600 MG 12 hr tablet Commonly known as: MUCINEX Take 600 mg by mouth 2 (two) times daily. (1) tab, oral, As Needed, Administer med as needed for cold/congestion.   HYDROcodone-acetaminophen 5-325 MG tablet Commonly known as: Norco Take 1 tablet by mouth  daily.   hydrocortisone 2.5 % cream Apply 1 application topically 2 (two) times daily as needed. Apply to affected areas on face.   hydrocortisone cream 1 % Apply 1 application topically 2 (two) times daily. May self apply twice daily to Hemorrhoids.   Ketoconazole-Hydrocortisone 2-2.5 % Crea Apply 1 application topically as needed. Apply to face for rash one time daily as needed.   Linzess 290 MCG Caps capsule Generic drug: linaclotide Take 290 mcg by mouth daily. Take by mouth 54minutes before breakfast on an empty stomach at 7:30am. Be sure to swallow whole   loratadine 10 MG tablet Commonly known as: CLARITIN Take 10 mg by mouth in the morning and at bedtime.   LORazepam 0.5 MG tablet Commonly known as: ATIVAN Take 1 tablet (0.5 mg total) by mouth 2 (two) times daily.   magnesium hydroxide 400 MG/5ML suspension Commonly known as: MILK OF MAGNESIA Take 30 mLs by mouth daily as needed.   MIRALAX PO Take 17 g by mouth daily as needed.   polyethylene glycol 17 g packet Commonly known as: MIRALAX / GLYCOLAX Take 17 g by mouth in the morning and at bedtime.   mometasone 0.1 % cream Commonly known as: ELOCON May self administer to face twice daily as needed.   ORAL GEL ANESTHETIC MT Use as directed in the mouth or  throat every 4 (four) hours as needed.   OYSTER SHELL PO Take 250 mg by mouth in the morning and at bedtime.   saccharomyces boulardii 250 MG capsule Commonly known as: FLORASTOR Take 250 mg by mouth 2 (two) times daily. 8am   spironolactone 25 MG tablet Commonly known as: ALDACTONE Take 12.5 mg by mouth daily.   triamcinolone cream 0.5 % Commonly known as: KENALOG Apply 1 application topically 2 (two) times daily. Apply to back for itching in the morning and evening.       Review of Systems  Review of Systems  Constitutional: Negative for activity change, appetite change, chills, diaphoresis, fatigue and fever.  HENT: Negative for mouth sores, postnasal drip, rhinorrhea, sinus pain and sore throat.   Respiratory: Negative for apnea, cough, chest tightness, shortness of breath and wheezing.   Cardiovascular: Negative for chest pain, palpitations and leg swelling.  Gastrointestinal: Negative for abdominal distention, abdominal pain, constipation, diarrhea, nausea and vomiting.  Genitourinary: Negative for dysuria and frequency.  Musculoskeletal: Negative for arthralgias, joint swelling and myalgias.  Skin: Negative for rash.  Neurological: Negative for dizziness, syncope, weakness, light-headedness and numbness.  Psychiatric/Behavioral: Negative for behavioral problems, confusion and sleep disturbance.     Immunization History  Administered Date(s) Administered  . Influenza Split 07/23/2013  . Influenza Whole 07/05/2009, 06/26/2018  . Influenza-Unspecified 07/29/2014, 06/14/2015, 07/11/2016, 07/15/2017, 06/27/2019, 07/05/2020  . Moderna Sars-Covid-2 Vaccination 09/26/2019, 10/24/2019, 08/02/2020  . PFIZER SARS-COV-2 Vaccination 09/26/2019  . PPD Test 06/17/2013  . Pneumococcal Conjugate-13 07/22/2017  . Pneumococcal Polysaccharide-23 05/05/2009  . Td 05/05/2009, 07/16/2019  . Zoster 01/09/2011   Pertinent  Health Maintenance Due  Topic Date Due  . INFLUENZA VACCINE   Completed  . DEXA SCAN  Completed  . PNA vac Low Risk Adult  Completed   Fall Risk  06/10/2018 06/04/2017 07/29/2014  Falls in the past year? No Yes No  Number falls in past yr: - 1 -  Injury with Fall? - No -   Functional Status Survey:    Vitals:   10/04/20 0918  BP: 120/68  Pulse: 73  Resp: 18  Temp: 97.9 F (36.6  C)  SpO2: 96%  Weight: 129 lb (58.5 kg)  Height: 5\' 6"  (1.676 m)   Body mass index is 20.82 kg/m. Physical Exam  Constitutional: Oriented to person, place, and time. Well-developed and well-nourished.  HENT:  Head: Normocephalic.  Mouth/Throat: Oropharynx is clear and moist. Mild Rough Patch of skin below her Left eye No Redness Eyes: Pupils are equal, round, and reactive to light.  Neck: Neck supple.  Cardiovascular: Normal rate and normal heart sounds.  No murmur heard. Pulmonary/Chest: Effort normal and breath sounds normal. No respiratory distress. No wheezes. She has no rales.  Abdominal: Soft. Bowel sounds are normal. No distension. There is no tenderness. There is no rebound.  Musculoskeletal: No edema.  Lymphadenopathy: none Neurological: Alert and oriented to person, place, and time.  Skin: Skin is warm and dry.  Psychiatric: Normal mood and affect. Behavior is normal. Thought content normal.    Labs reviewed: Recent Labs    11/16/19 0000 11/24/19 0000 03/02/20 0000  NA 138 142 139  K 4.8 4.6 4.4  CL 102 105 105  CO2 26* 23* 25*  BUN 19 17 18   CREATININE 1.2* 1.1 1.0  CALCIUM 9.4 9.7 9.7   Recent Labs    11/16/19 0000 11/24/19 0000 03/02/20 0000  AST 25 31 21   ALT 18 22 13   ALKPHOS 93 100 111  ALBUMIN 4.2 4.6 4.2   Recent Labs    11/16/19 0000 11/24/19 0000 03/02/20 0000  WBC 7.7 6.1 5.8  NEUTROABS 3,973 2,702 2,436  HGB 12.8 14.2 12.6  HCT 38 43 37  PLT 324 319 333   Lab Results  Component Value Date   TSH 2.52 11/21/2018   Lab Results  Component Value Date   HGBA1C 5.9 02/05/2017   Lab Results  Component  Value Date   CHOL 247 (A) 11/21/2018   HDL 72 (A) 11/21/2018   LDLCALC 147 11/21/2018   LDLDIRECT 190.0 03/24/2012   TRIG 150 11/21/2018   CHOLHDL 4 03/24/2012    Significant Diagnostic Results in last 30 days:  No results found.  Assessment/Plan Essential hypertension On Lasix and Aldactone Repeat BMP 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 Norco QD Gastroesophageal reflux disease,  On Famotidine Depression with anxiety Follows with Psych On Number of Meds Prolixin and Librium Constipation Continue Linzess Mixed hyperlipidemia Does not want statin Listed allergic Age-related osteoporosis with current pathological fracture, initial encounter Was on Prolia at some point stopped by son due to cost ? Allegic to fosamax   Facial Rash Refer to dermatology  Family/ staff Communication:   Labs/tests ordered:  [facility]

## 2020-10-05 ENCOUNTER — Other Ambulatory Visit: Payer: Self-pay

## 2020-10-05 NOTE — Telephone Encounter (Signed)
Refill request received from [place] for Norco 5/325 mg tablet. Medication pended and sent to Dr. Lyndel Safe for approval.

## 2020-10-06 ENCOUNTER — Other Ambulatory Visit: Payer: Self-pay | Admitting: *Deleted

## 2020-10-06 LAB — BASIC METABOLIC PANEL
BUN: 17 (ref 4–21)
CO2: 25 — AB (ref 13–22)
Chloride: 105 (ref 99–108)
Creatinine: 1 (ref 0.5–1.1)
Glucose: 80
Potassium: 4.4 (ref 3.4–5.3)
Sodium: 141 (ref 137–147)

## 2020-10-06 LAB — CBC AND DIFFERENTIAL
HCT: 37 (ref 36–46)
Hemoglobin: 12.7 (ref 12.0–16.0)
Neutrophils Absolute: 3419
Platelets: 300 (ref 150–399)
WBC: 7.4

## 2020-10-06 LAB — CBC: RBC: 4.12 (ref 3.87–5.11)

## 2020-10-06 LAB — HEPATIC FUNCTION PANEL
ALT: 15 (ref 7–35)
AST: 23 (ref 13–35)
Alkaline Phosphatase: 94 (ref 25–125)
Bilirubin, Total: 0.3

## 2020-10-06 LAB — TSH: TSH: 2.76 (ref 0.41–5.90)

## 2020-10-06 LAB — COMPREHENSIVE METABOLIC PANEL
Albumin: 4.1 (ref 3.5–5.0)
Calcium: 9.4 (ref 8.7–10.7)
Globulin: 2.7

## 2020-10-06 MED ORDER — HYDROCODONE-ACETAMINOPHEN 5-325 MG PO TABS: 1.0000 | ORAL_TABLET | Freq: Every day | ORAL | 0 refills | Status: DC

## 2020-10-06 NOTE — Telephone Encounter (Signed)
Received refill Request from [facility] Pended [facility] and sent to Dr. Gupta for approval.  

## 2020-10-10 ENCOUNTER — Other Ambulatory Visit: Payer: Self-pay | Admitting: *Deleted

## 2020-10-10 MED ORDER — LORAZEPAM 0.5 MG PO TABS: 0.5000 mg | ORAL_TABLET | Freq: Two times a day (BID) | ORAL | 0 refills | Status: DC

## 2020-10-10 NOTE — Telephone Encounter (Signed)
Received refill Request from [facility] Pended [facility] and sent to Dr. Gupta for approval.  

## 2020-11-11 ENCOUNTER — Other Ambulatory Visit: Payer: Self-pay | Admitting: *Deleted

## 2020-11-11 MED ORDER — CHLORDIAZEPOXIDE HCL 25 MG PO CAPS: ORAL_CAPSULE | ORAL | 2 refills | Status: DC

## 2020-11-11 NOTE — Telephone Encounter (Signed)
Received fax from [facility] Pended [facility] and sent to Dr. Gupta for approval.  

## 2020-12-08 ENCOUNTER — Other Ambulatory Visit: Payer: Self-pay | Admitting: *Deleted

## 2020-12-08 MED ORDER — LORAZEPAM 0.5 MG PO TABS: 0.5000 mg | ORAL_TABLET | Freq: Two times a day (BID) | ORAL | 0 refills | Status: DC

## 2020-12-08 MED ORDER — CHLORDIAZEPOXIDE HCL 25 MG PO CAPS: ORAL_CAPSULE | ORAL | 2 refills | Status: DC

## 2020-12-08 NOTE — Telephone Encounter (Signed)
Received refill Request from pharmacy.  Pended Rx and sent to Dr. Gupta for approval.  

## 2020-12-12 ENCOUNTER — Encounter: Payer: Self-pay | Admitting: Nurse Practitioner

## 2020-12-12 ENCOUNTER — Non-Acute Institutional Stay: Payer: Medicare Other | Admitting: Nurse Practitioner

## 2020-12-12 DIAGNOSIS — F418 Other specified anxiety disorders: Secondary | ICD-10-CM | POA: Diagnosis not present

## 2020-12-12 DIAGNOSIS — K5909 Other constipation: Secondary | ICD-10-CM | POA: Diagnosis not present

## 2020-12-12 DIAGNOSIS — K219 Gastro-esophageal reflux disease without esophagitis: Secondary | ICD-10-CM | POA: Diagnosis not present

## 2020-12-12 DIAGNOSIS — J3089 Other allergic rhinitis: Secondary | ICD-10-CM | POA: Diagnosis not present

## 2020-12-12 DIAGNOSIS — J302 Other seasonal allergic rhinitis: Secondary | ICD-10-CM

## 2020-12-12 NOTE — Progress Notes (Signed)
Location:   [place] Room Number: [name] of Service:  ALF [phone]) Provider:  Marda Stalker, Lennie Odor NP [name], MD  Patient Care Team: [name], MD as PCP - General (Internal Medicine) [name], MD (Ophthalmology) [name], MD (Psychiatry) [name] X, NP as Nurse Practitioner (Nurse Practitioner) [name], MD as Consulting Physician (Orthopedic Surgery)  Extended Emergency Contact Information Primary Emergency Contact: [name], [place] Work Phone: [phone] Relation: Son Secondary Emergency Contact: [name] [place] Phone: [phone] Relation: Sister  Code Status:  DNR Managed Care Goals of care: Advanced Directive information Advanced Directives 10/04/2020  Does Patient Have a Medical Advance Directive? Yes  Type of Paramedic of [place];Living will;Out of facility DNR (pink MOST or yellow form)  Does patient want to make changes to medical advance directive? No - Patient declined  Copy of [place] in Chart? Yes - validated most recent copy scanned in chart (See row information)  Pre-existing out of facility DNR order (yellow form or pink MOST form) Yellow form placed in chart (order not valid for inpatient use)     Chief Complaint  Patient presents with  . Medical Management of Chronic Issues    HPI:  Pt is a 80 y.o. Jean [name] seen today for medical management of chronic diseases.    HTN, on Furosemide 20mg  qd, Spironolactone 12.5mg  qd, ASA 81mg  qd,  Bun/creat 17/1.0 10/06/20             OA on Tylenol 325mg  bid/prn, Norco 5/325mg  qd.             GERD, stable, on Famotidine 20mg  qd, Maalox qd/prn,  Hgb 12.7 10/06/20  Depression/anxiety, on Amitriptyline 50mg  qd, Librium 25mg  qd, Fluphenazine 1mg  qd, Lorazepam 0.5mg  bid, TSH 2.76 10/06/20             Chronic constipation, on prn Dulcolax po/suppository,  Linzess, prn MOM, MiraLax bid/prn             Allergic rhinitis, takes Claritin, Flonase, Mucinex    Past Medical History:  Diagnosis Date  . Allergy   . Anemia   . Anxiety   . Asthma   . CAD (coronary artery disease)   . Depression   . Diverticulosis   . Dysfunction of eustachian tube   . Elevated LFTs   . Esophageal reflux   . Esophageal stricture 2002  . Hemorrhoids   . History of rectal polyps   . HLD (hyperlipidemia)   . HTN (hypertension)   . Hypertonicity of bladder   . Irritable bowel syndrome with constipation   . Obesity   . Optic neuritis   . Osteoporosis   . Peptic ulcer disease   . Peripheral neuropathy   . Trigeminal neuralgia    prior injury  . 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 Vitamin D deficiency    Past Surgical History:  Procedure Laterality Date  . ABDOMINAL HYSTERECTOMY    . ANAL RECTAL MANOMETRY N/A 07/05/2014   Procedure: ANO RECTAL MANOMETRY;  Surgeon: Leighton Ruff, MD;  Location: [facility];  Service: Endoscopy;  Laterality: N/A;  . APPENDECTOMY    . CAROTID ENDARTERECTOMY    . COLONOSCOPY  2008, 2015  . TONSILLECTOMY    . UPPER GASTROINTESTINAL ENDOSCOPY  2002    Allergies  Allergen Reactions  . Biaxin [Clarithromycin]   . Clarithromycin   . Doxycycline   . Fosamax [Alendronate Sodium]   . Geodon [Ziprasidone Hydrochloride]   . Lipitor [Atorvastatin Calcium]   . Penicillins   . Pravachol [Pravastatin Sodium]   . Statins   . Sulfonamide Derivatives   . Zithromax [Azithromycin]   . Zocor [Simvastatin]     Allergies as of 12/12/2020      Reactions   Biaxin [clarithromycin]    Clarithromycin    Doxycycline    Fosamax [alendronate Sodium]    Geodon [ziprasidone Hydrochloride]    Lipitor [atorvastatin Calcium]    Penicillins    Pravachol [pravastatin Sodium]    Statins    Sulfonamide Derivatives    Zithromax [azithromycin]    Zocor [simvastatin]        Medication List       Accurate as of December 12, 2020 11:59 PM. If you have any questions, ask your nurse or doctor.        Tylenol 325 MG Caps Generic drug: Acetaminophen Take 325 mg by mouth in the morning and at bedtime.   acetaminophen 325 MG tablet Commonly known as: TYLENOL Take 650 mg by mouth every 8 (eight) hours as needed. 8am & 8pm AS NEEDED FOR PAIN *NOT TO EXCEED 3,000 MG IN 24HRS*   alum & mag hydroxide-simeth 671-245-80 MG/5ML suspension Commonly known as: MAALOX/MYLANTA Take 30 mLs by mouth at bedtime. 9pm   alum & mag hydroxide-simeth 200-200-20 MG/5ML suspension Commonly known as: MAALOX/MYLANTA Take 30 mLs by mouth every 4 (four) hours as needed for indigestion or heartburn.   amitriptyline 50 MG tablet Commonly known as: ELAVIL Take 50 mg by mouth at bedtime. 9pm   ARTIFICIAL TEAR OP Apply 1 drop to eye 3 (three) times daily. Both Eyes at 8am, 2pm, and 9pm.   aspirin 81 MG EC tablet Take 81 mg by mouth daily. 9am   betamethasone valerate 0.1 % cream Commonly known as: VALISONE Apply 1 application topically as needed. Apply  thin amount to vulva twice daily.   Biofreeze 4 % Gel Generic drug: Menthol (Topical Analgesic) Apply topically in the morning, at noon, in the evening, and at bedtime. Apply to knee   bisacodyl 10 MG suppository Commonly known as: DULCOLAX Place 10 mg rectally daily as needed for moderate constipation.   bisacodyl 5 MG EC tablet Commonly known as: DULCOLAX Take 5 mg by mouth daily as needed for moderate constipation.   camphor-menthol lotion Commonly known as: SARNA Apply topically. 0.5-0.5 % Three times a day as needed to mid back   carbamide peroxide 6.5 % OTIC solution Commonly known as: DEBROX Place 5 drops to affected ear every night for 3 days, then irrigate with bulb syringe. If not effective, notify MD.   CENTRUM SILVER PO Take 1 tablet by mouth. 8am   chlordiazePOXIDE 25 MG capsule Commonly known as:  LIBRIUM Take one capsule by mouth at bedtime   famotidine 20 MG tablet Commonly known as: PEPCID Take 20 mg by mouth at bedtime. 8pm   fluPHENAZine 1 MG tablet Commonly known as: PROLIXIN Take 1 mg by mouth at bedtime. 8pm.   fluticasone 50 MCG/ACT nasal spray Commonly known as: FLONASE Place 1 spray into both nostrils 2 (two) times daily. 8am & 8pm.   furosemide 20 MG tablet Commonly known as: LASIX Take 20 mg by mouth daily. 8am   guaiFENesin 600 MG 12 hr tablet Commonly known as: MUCINEX Take 600 mg by mouth  2 (two) times daily. (1) tab, oral, As Needed, Administer med as needed for cold/congestion.   HYDROcodone-acetaminophen 5-325 MG tablet Commonly known as: Norco Take 1 tablet by mouth daily.   hydrocortisone 2.5 % cream Apply 1 application topically 2 (two) times daily as needed. Apply to affected areas on face.   hydrocortisone cream 1 % Apply 1 application topically 2 (two) times daily. May self apply twice daily to Hemorrhoids.   Ketoconazole-Hydrocortisone 2-2.5 % Crea Apply 1 application topically as needed. Apply to face for rash one time daily as needed.   Linzess 290 MCG Caps capsule Generic drug: linaclotide Take 290 mcg by mouth daily. Take by mouth 55minutes before breakfast on an empty stomach at 7:30am. Be sure to swallow whole   loratadine 10 MG tablet Commonly known as: CLARITIN Take 10 mg by mouth in the morning and at bedtime.   LORazepam 0.5 MG tablet Commonly known as: ATIVAN Take 1 tablet (0.5 mg total) by mouth 2 (two) times daily.   magnesium hydroxide 400 MG/5ML suspension Commonly known as: MILK OF MAGNESIA Take 30 mLs by mouth daily as needed.   MIRALAX PO Take 17 g by mouth daily as needed.   polyethylene glycol 17 g packet Commonly known as: MIRALAX / GLYCOLAX Take 17 g by mouth in the morning and at bedtime.   mometasone 0.1 % cream Commonly known as: ELOCON May self administer to face twice daily as needed.   ORAL GEL  ANESTHETIC MT Use as directed in the mouth or throat every 4 (four) hours as needed.   OYSTER SHELL PO Take 250 mg by mouth in the morning and at bedtime.   saccharomyces boulardii 250 MG capsule Commonly known as: FLORASTOR Take 250 mg by mouth 2 (two) times daily. 8am   spironolactone 25 MG tablet Commonly known as: ALDACTONE Take 12.5 mg by mouth daily.   triamcinolone cream 0.5 % Commonly known as: KENALOG Apply 1 application topically 2 (two) times daily. Apply to back for itching in the morning and evening.       Review of Systems  Constitutional: Negative for activity change, fever and unexpected weight change.       Weight loss #6Ibs in 3 months.   HENT: Positive for hearing loss. Negative for congestion, rhinorrhea and voice change.        Hoarseness  Eyes: Positive for visual disturbance.       Low vision  Respiratory: Negative for cough, shortness of breath and wheezing.   Cardiovascular: Negative for leg swelling.  Gastrointestinal: Negative for abdominal pain and constipation.  Genitourinary: Negative for dysuria and frequency.  Musculoskeletal: Positive for arthralgias and gait problem. Negative for back pain.       OA is under control  Skin: Positive for rash. Negative for color change.       Improved facial rash, but still wants f/u dermatology.   Neurological: Negative for speech difficulty, weakness and headaches.       Memory lapses  Psychiatric/Behavioral: Negative for behavioral problems and sleep disturbance. The patient is not nervous/anxious.     Immunization History  Administered Date(s) Administered  . Influenza Split 07/23/2013  . Influenza Whole 07/05/2009, 06/26/2018  . Influenza-Unspecified 07/29/2014, 06/14/2015, 07/11/2016, 07/15/2017, 06/27/2019, 07/05/2020  . Moderna Sars-Covid-2 Vaccination 09/26/2019, 10/24/2019, 08/02/2020  . PFIZER(Purple Top)SARS-COV-2 Vaccination 09/26/2019  . PPD Test 06/17/2013  . Pneumococcal Conjugate-13  07/22/2017  . Pneumococcal Polysaccharide-23 05/05/2009  . Td 05/05/2009, 07/16/2019  . Zoster 01/09/2011   Pertinent  Health Maintenance Due  Topic Date Due  . INFLUENZA VACCINE  Completed  . DEXA SCAN  Completed  . PNA vac Low Risk Adult  Completed   Fall Risk  06/10/2018 06/04/2017 07/29/2014  Falls in the past year? No Yes No  Number falls in past yr: - 1 -  Injury with Fall? - No -   Functional Status Survey:    Vitals:   12/12/20 1636  BP: (!) 120/58  Pulse: 68  Resp: 18  Temp: (!) 97.4 F (36.3 C)  SpO2: 99%  Weight: 129 lb 12.8 oz (58.9 kg)  Height: 5\' 6"  (1.676 m)   Body mass index is 20.95 kg/m. Physical Exam Constitutional:      Appearance: Normal appearance.  HENT:     Head: Normocephalic.     Comments: No erythema or drainage in thorat    Nose: Nose normal.     Mouth/Throat:     Mouth: Mucous membranes are moist.  Eyes:     Extraocular Movements: Extraocular movements intact.     Conjunctiva/sclera: Conjunctivae normal.     Pupils: Pupils are equal, round, and reactive to light.     Comments: Legally blind  Cardiovascular:     Rate and Rhythm: Normal rate and regular rhythm.     Heart sounds: No murmur heard.   Pulmonary:     Effort: Pulmonary effort is normal.     Breath sounds: No rales.  Abdominal:     General: Bowel sounds are normal.     Palpations: Abdomen is soft.     Tenderness: There is no abdominal tenderness.  Musculoskeletal:     Cervical back: Normal range of motion and neck supple.     Right lower leg: No edema.     Left lower leg: No edema.  Skin:    General: Skin is warm and dry.     Findings: Rash present.     Comments: Facial rash improved, residual a few red spots left face and around her mouth.   Neurological:     General: No focal deficit present.     Mental Status: She is alert. Mental status is at baseline.     Gait: Gait abnormal.     Comments: Oriented to person, place.   Psychiatric:        Mood and Affect:  Mood normal.        Behavior: Behavior normal.        Thought Content: Thought content normal.     Labs reviewed: Recent Labs    03/02/20 0000 10/06/20 0000  NA 139 141  K 4.4 4.4  CL 105 105  CO2 25* 25*  BUN 18 17  CREATININE 1.0 1.0  CALCIUM 9.7 9.4   Recent Labs    03/02/20 0000 10/06/20 0000  AST 21 23  ALT 13 15  ALKPHOS 111 94  ALBUMIN 4.2 4.1   Recent Labs    03/02/20 0000 10/06/20 0000  WBC 5.8 7.4  NEUTROABS 2,436 3,419.00  HGB 12.6 12.7  HCT 37 37  PLT 333 300   Lab Results  Component Value Date   TSH 2.76 10/06/2020   Lab Results  Component Value Date   HGBA1C 5.9 02/05/2017   Lab Results  Component Value Date   CHOL 247 (A) 11/21/2018   HDL 72 (A) 11/21/2018   LDLCALC 147 11/21/2018   LDLDIRECT 190.0 03/24/2012   TRIG 150 11/21/2018   CHOLHDL 4 03/24/2012    Significant Diagnostic Results in  last 30 days:  No results found.  Assessment/Plan There are no diagnoses linked to this encounter.   Family/ staff Communication:   Labs/tests ordered:

## 2020-12-13 ENCOUNTER — Encounter: Payer: Self-pay | Admitting: Nurse Practitioner

## 2020-12-13 NOTE — Assessment & Plan Note (Signed)
on prn Dulcolax po/suppository, Linzess, prn MOM, MiraLax bid/prn °

## 2020-12-13 NOTE — Assessment & Plan Note (Signed)
stable, on Famotidine 20mg qd, Maalox qd/prn,  Hgb 12.7 10/06/20 

## 2020-12-13 NOTE — Assessment & Plan Note (Signed)
on Amitriptyline 50mg qd, Librium 25mg qd, Fluphenazine 1mg qd, Lorazepam 0.5mg bid, TSH 2.76 10/06/20 

## 2020-12-13 NOTE — Assessment & Plan Note (Signed)
takes Claritin, Flonase, Mucinex       

## 2021-01-02 ENCOUNTER — Other Ambulatory Visit: Payer: Self-pay | Admitting: *Deleted

## 2021-01-02 MED ORDER — HYDROCODONE-ACETAMINOPHEN 5-325 MG PO TABS: 1.0000 | ORAL_TABLET | Freq: Every day | ORAL | 0 refills | Status: DC

## 2021-01-02 NOTE — Telephone Encounter (Signed)
Received fax from [facility] Pended [facility] and sent to Dr. Gupta for approval.  

## 2021-01-09 ENCOUNTER — Other Ambulatory Visit: Payer: Self-pay

## 2021-01-09 NOTE — Telephone Encounter (Signed)
Refill request received for [place] for Lorazepam 0.5 mg tablet.  Medication pended and sent to Dr. Lyndel Safe for approval

## 2021-01-10 ENCOUNTER — Other Ambulatory Visit: Payer: Self-pay | Admitting: *Deleted

## 2021-01-10 MED ORDER — CHLORDIAZEPOXIDE HCL 25 MG PO CAPS: ORAL_CAPSULE | ORAL | 1 refills | Status: DC

## 2021-01-10 MED ORDER — LORAZEPAM 0.5 MG PO TABS: 0.5000 mg | ORAL_TABLET | Freq: Two times a day (BID) | ORAL | 0 refills | Status: DC

## 2021-01-10 NOTE — Telephone Encounter (Signed)
Received refill Request Pended Rx and sent to Dr. Lyndel Safe for approval.

## 2021-02-08 ENCOUNTER — Other Ambulatory Visit: Payer: Self-pay

## 2021-02-08 MED ORDER — LORAZEPAM 0.5 MG PO TABS: 0.5000 mg | ORAL_TABLET | Freq: Two times a day (BID) | ORAL | 5 refills | Status: DC

## 2021-03-10 ENCOUNTER — Encounter (HOSPITAL_COMMUNITY): Payer: Self-pay | Admitting: Emergency Medicine

## 2021-03-10 ENCOUNTER — Emergency Department (HOSPITAL_COMMUNITY)
Admission: EM | Admit: 2021-03-10 | Discharge: 2021-03-10 | Disposition: A | Discharge status: Skilled Nursing Facility | Payer: Medicare Other | Attending: Emergency Medicine | Admitting: Emergency Medicine

## 2021-03-10 ENCOUNTER — Emergency Department (HOSPITAL_COMMUNITY): Payer: Medicare Other

## 2021-03-10 DIAGNOSIS — Z7951 Long term (current) use of inhaled steroids: Secondary | ICD-10-CM | POA: Diagnosis not present

## 2021-03-10 DIAGNOSIS — I2510 Atherosclerotic heart disease of native coronary artery without angina pectoris: Secondary | ICD-10-CM | POA: Insufficient documentation

## 2021-03-10 DIAGNOSIS — S0181XA Laceration without foreign body of other part of head, initial encounter: Secondary | ICD-10-CM | POA: Insufficient documentation

## 2021-03-10 DIAGNOSIS — W19XXXA Unspecified fall, initial encounter: Secondary | ICD-10-CM

## 2021-03-10 DIAGNOSIS — J45909 Unspecified asthma, uncomplicated: Secondary | ICD-10-CM | POA: Diagnosis not present

## 2021-03-10 DIAGNOSIS — Z7982 Long term (current) use of aspirin: Secondary | ICD-10-CM | POA: Diagnosis not present

## 2021-03-10 DIAGNOSIS — I10 Essential (primary) hypertension: Secondary | ICD-10-CM | POA: Insufficient documentation

## 2021-03-10 DIAGNOSIS — W06XXXA Fall from bed, initial encounter: Secondary | ICD-10-CM | POA: Insufficient documentation

## 2021-03-10 DIAGNOSIS — Z79899 Other long term (current) drug therapy: Secondary | ICD-10-CM | POA: Insufficient documentation

## 2021-03-10 DIAGNOSIS — S0990XA Unspecified injury of head, initial encounter: Secondary | ICD-10-CM

## 2021-03-10 MED ORDER — LIDOCAINE-EPINEPHRINE (PF) 2 %-1:200000 IJ SOLN
10.0000 mL | Freq: Once | INTRAMUSCULAR | Status: AC
  Administered 2021-03-10: 10 mL
  Filled 2021-03-10: qty 20

## 2021-03-10 NOTE — Discharge Instructions (Addendum)
Clean the wound on your face with soap and water daily.  After that apply an antibiotic ointment such as Neosporin or bacitracin.  Cover the wound, to help keep it clean, and to help control bleeding.  It is okay to get the wound wet in the shower or while you are bathing.  Have the sutures removed by your doctor or the nurse at [facility], in 5 days.

## 2021-03-10 NOTE — ED Triage Notes (Signed)
Patient arrived via [facility] from [facility].   Called out for a fall.   No LOC. Mechanical fall from tripping.   [address] and hit her head.   No blood thinners.   Possible hematoma and laceration was wrapped by staff at facility.   Bp-158/88 P-61 Rr-15 98% RA 98.5 CBG-82  A/ox4  Ambulatory with assistance Impaired vision.

## 2021-03-10 NOTE — ED Notes (Signed)
Report given to Bill at [facility].

## 2021-03-10 NOTE — ED Provider Notes (Addendum)
[facility] DEPT Provider Note   CSN: [id number] Arrival date & time: 03/10/21  1252     History Chief Complaint  Patient presents with   Lytle Michaels    Jean [name] is a 80 y.o. female.  HPI She presents for evaluation of injury from fall.  She states she was walking on her bed, to set a clock, when she lost her balance and fell forward striking her head on the bedside table.  She was not able to get up but staff assisted her.  She presents by EMS, ambulatory, complaining only of an injury to her forehead.  She denies headache, neck pain, weakness or dizziness.  There was no noted loss of consciousness.  She denies recent illnesses.  There are no other known modifying factors.    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, 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   Vitamin D deficiency     Patient Active Problem List   Diagnosis Date Noted   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   Eyebrow laceration, right, subsequent encounter 03/14/2021   Allergic rhinitis 03/14/2021   Weight loss 06/01/2020   Multiple rib fractures 01/06/2020   Fall 01/04/2020   Dizziness 11/16/2019   Back pain due to injury 11/16/2019   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 11/16/2019   Pneumonia 09/26/2016   UTI (urinary tract infection) 01/27/2015   Tinnitus of both ears 01/21/2015   Tear  of meniscus of left knee 01/21/2015   Rectocele 06/28/2014   Pelvic floor dysfunction 06/28/2014   Hemorrhoids 02/11/2014   GERD (gastroesophageal reflux disease) 04/13/2013   Carotid artery disease ([place]) 12/31/2011   Constipation 02/23/2011   OPTIC NEURITIS 05/11/2010   Osteoporosis 05/05/2009   Diverticulosis of large intestine without diverticulitis 12/02/2008   Irritable bowel syndrome 12/02/2008   RECTAL POLYPS 12/02/2008   History of muscle pain 08/17/2008   PVD (peripheral vascular disease) ([place]) 08/17/2008   VITAMIN D DEFICIENCY 05/19/2008   Hyperlipidemia 05/19/2008   Depression with anxiety 01/06/2008   Essential hypertension 11/20/2007   Seasonal and perennial allergic rhinitis 08/01/2007    Past Surgical History:  Procedure Laterality Date   ABDOMINAL HYSTERECTOMY     ANAL RECTAL MANOMETRY N/A 07/05/2014   Procedure: ANO RECTAL MANOMETRY;  Surgeon: Leighton Ruff, MD;  Location: [facility];  Service: Endoscopy;  Laterality: N/A;   APPENDECTOMY     CAROTID ENDARTERECTOMY     COLONOSCOPY  2008, 2015   TONSILLECTOMY     UPPER GASTROINTESTINAL ENDOSCOPY  2002     OB History   No obstetric history on file.     Family History  Problem Relation Age of Onset   Alzheimer's disease Mother  Coronary artery disease Father    Heart attack Father    Heart disease Father    Anuerysm Father        AAA   Ovarian cancer Sister    Lung cancer Brother    Heart disease Brother    Anuerysm Brother        AAA   Diabetes Neg Hx    Colon cancer Neg Hx     Social History   Tobacco Use   Smoking status: Never   Smokeless tobacco: Never  Vaping Use   Vaping Use: Never used  Substance Use Topics   Alcohol use: No    Alcohol/week: 0.0 standard drinks   Drug use: No    Home Medications Prior to Admission medications   Medication Sig Start Date End Date Taking? Authorizing Provider  Acetaminophen (TYLENOL) 325 MG CAPS Take 325 mg by mouth in the morning and at  bedtime.    [provider]  acetaminophen (TYLENOL) 325 MG tablet Take 650 mg by mouth every 8 (eight) hours as needed. 8am & 8pm AS NEEDED FOR PAIN *NOT TO EXCEED 3,000 MG IN 24HRS*    [provider]  alum & mag hydroxide-simeth (MAALOX/MYLANTA) 200-200-20 MG/5ML suspension Take 30 mLs by mouth at bedtime. 9pm    [provider]  alum & mag hydroxide-simeth (MAALOX/MYLANTA) 200-200-20 MG/5ML suspension Take 30 mLs by mouth every 4 (four) hours as needed for indigestion or heartburn.    [provider]  amitriptyline (ELAVIL) 50 MG tablet Take 50 mg by mouth at bedtime. 9pm    [provider]  ARTIFICIAL TEAR OP Apply 1 drop to eye 3 (three) times daily. Both Eyes at 8am, 2pm, and 9pm.    [provider]  aspirin 81 MG EC tablet Take 81 mg by mouth daily. 9am    [provider]  Benzocaine (ORAL GEL ANESTHETIC MT) Use as directed in the mouth or throat every 4 (four) hours as needed.    [provider]  betamethasone valerate (VALISONE) 0.1 % cream Apply 1 application topically as needed. Apply  thin amount to vulva twice daily.    [provider]  bisacodyl (DULCOLAX) 10 MG suppository Place 10 mg rectally daily as needed for moderate constipation.    [provider]  bisacodyl (DULCOLAX) 5 MG EC tablet Take 5 mg by mouth daily as needed for moderate constipation.    [provider]  camphor-menthol Timoteo Ace) lotion Apply topically. 0.5-0.5 % Three times a day as needed to mid back    [provider]  carbamide peroxide (DEBROX) 6.5 % OTIC solution Place 5 drops to affected ear every night for 3 days, then irrigate with bulb syringe. If not effective, notify MD.    [provider]  chlordiazePOXIDE (LIBRIUM) 25 MG capsule Take one capsule by mouth at bedtime 01/10/21   [name], MD  famotidine (PEPCID) 20 MG tablet Take 20 mg by mouth at bedtime. 8pm    [provider]   fluPHENAZine (PROLIXIN) 1 MG tablet Take 1 mg by mouth at bedtime. 8pm.    [provider]  fluticasone (FLONASE) 50 MCG/ACT nasal spray Place 1 spray into both nostrils 2 (two) times daily. 8am & 8pm.    [provider]  furosemide (LASIX) 20 MG tablet Take 20 mg by mouth daily. 8am    [provider]  guaiFENesin (MUCINEX) 600 MG 12 hr tablet Take 600 mg by mouth 2 (two) times daily. (  1) tab, oral, As Needed, Administer med as needed for cold/congestion.    [provider]  HYDROcodone-acetaminophen (NORCO) 5-325 MG tablet Take 1 tablet by mouth daily. 01/02/21   [name], MD  hydrocortisone 2.5 % cream Apply 1 application topically 2 (two) times daily as needed. Apply to affected areas on face.    [provider]  hydrocortisone cream 1 % Apply 1 application topically 2 (two) times daily. May self apply twice daily to Hemorrhoids.    [provider]  Ketoconazole-Hydrocortisone 2-2.5 % CREA Apply 1 application topically as needed. Apply to face for rash one time daily as needed.    [provider]  linaclotide (LINZESS) 290 MCG CAPS capsule Take 290 mcg by mouth daily. Take by mouth 52minutes before breakfast on an empty stomach at 7:30am. Be sure to swallow whole    [provider]  loratadine (CLARITIN) 10 MG tablet Take 10 mg by mouth in the morning and at bedtime.    [provider]  LORazepam (ATIVAN) 0.5 MG tablet Take 1 tablet (0.5 mg total) by mouth 2 (two) times daily. 02/08/21   Mast, Man X, NP  magnesium hydroxide (MILK OF MAGNESIA) 400 MG/5ML suspension Take 30 mLs by mouth daily as needed.     [provider]  Menthol, Topical Analgesic, (BIOFREEZE) 4 % GEL Apply topically in the morning, at noon, in the evening, and at bedtime. Apply to knee    [provider]  mometasone (ELOCON) 0.1 % cream May self administer to face twice daily as needed.    [provider]  Multiple  Vitamins-Minerals (CENTRUM SILVER PO) Take 1 tablet by mouth. 8am    [provider]  OYSTER SHELL PO Take 250 mg by mouth in the morning and at bedtime.    [provider]  polyethylene glycol (MIRALAX / GLYCOLAX) 17 g packet Take 17 g by mouth in the morning and at bedtime.    [provider]  Polyethylene Glycol 3350 (MIRALAX PO) Take 17 g by mouth daily as needed.    [provider]  saccharomyces boulardii (FLORASTOR) 250 MG capsule Take 250 mg by mouth 2 (two) times daily. 8am    [provider]  spironolactone (ALDACTONE) 25 MG tablet Take 12.5 mg by mouth daily.    [provider]  triamcinolone cream (KENALOG) 0.5 % Apply 1 application topically 2 (two) times daily. Apply to back for itching in the morning and evening.    [provider]    Allergies    Biaxin [clarithromycin], Clarithromycin, Doxycycline, Fosamax [alendronate sodium], Geodon [ziprasidone hydrochloride], Lipitor [atorvastatin calcium], Penicillins, Pravachol [pravastatin sodium], Statins, Sulfonamide derivatives, Zithromax [azithromycin], and Zocor [simvastatin]  Review of Systems   Review of Systems  All other systems reviewed and are negative.  Physical Exam Updated Vital Signs BP (!) 150/72 (BP Location: Right Arm)   Pulse 70   Temp 98.9 F (37.2 C) (Oral)   Resp 16   SpO2 98%   Physical Exam Vitals and nursing note reviewed.  Constitutional:      Appearance: She is well-developed.  HENT:     Head: Normocephalic.     Comments: Contusion right forehead with vertical laceration, gaping and bleeding somewhat.  No significant periorbital swelling.  No injury to the midface or jaw.  No nose bleeding. Eyes:     Conjunctiva/sclera: Conjunctivae normal.     Pupils: Pupils are equal, round, and reactive to light.  Neck:  Trachea: Phonation normal.  Cardiovascular:     Rate and Rhythm: Normal rate and regular rhythm.  Pulmonary:     Effort:  Pulmonary effort is normal.     Breath sounds: Normal breath sounds.  Chest:     Chest wall: No tenderness.  Abdominal:     General: There is no distension.     Palpations: Abdomen is soft.     Tenderness: There is no abdominal tenderness. There is no guarding.  Musculoskeletal:        General: No swelling or tenderness. Normal range of motion.     Cervical back: Normal range of motion and neck supple.  Skin:    General: Skin is warm and dry.  Neurological:     Mental Status: She is alert and oriented to person, place, and time.     Motor: No abnormal muscle tone.  Psychiatric:        Behavior: Behavior normal.        Thought Content: Thought content normal.        Judgment: Judgment normal.    ED Results / Procedures / Treatments   Labs (all labs ordered are listed, but only abnormal results are displayed) Labs Reviewed - No data to display  EKG None  Radiology No results found.  Procedures .Marland KitchenLaceration Repair  Date/Time: 03/10/2021 4:13 PM Performed by: Daleen Bo, MD Authorized by: Daleen Bo, MD   Consent:    Consent obtained:  Verbal   Risks, benefits, and alternatives were discussed: yes     Risks discussed:  Infection and pain Universal protocol:    Procedure explained and questions answered to patient or proxy's satisfaction: yes     Immediately prior to procedure, a time out was called: yes     Patient identity confirmed:  Verbally with patient Anesthesia:    Anesthesia method:  Local infiltration   Local anesthetic:  Lidocaine 2% WITH epi Laceration details:    Location:  Face   Face location:  Forehead   Length (cm):  2.5   Depth (mm):  9 Pre-procedure details:    Preparation:  Patient was prepped and draped in usual sterile fashion Exploration:    Limited defect created (wound extended): no     Hemostasis achieved with:  Direct pressure   Wound exploration: wound explored through full range of motion     Wound extent: no areolar tissue  violation noted, no fascia violation noted, no foreign bodies/material noted, no nerve damage noted and no underlying fracture noted     Contaminated: no   Treatment:    Area cleansed with:  Povidone-iodine   Amount of cleaning:  Standard   Irrigation solution:  Sterile saline   Irrigation volume:  15 cc   Visualized foreign bodies/material removed: no     Debridement:  None   Undermining:  None   Scar revision: no   Skin repair:    Repair method:  Sutures   Suture size:  5-0   Suture material:  Prolene   Suture technique:  Simple interrupted   Number of sutures:  4 Approximation:    Approximation:  Loose Repair type:    Repair type:  Simple Post-procedure details:    Dressing:  Antibiotic ointment and non-adherent dressing   Procedure completion:  Tolerated well, no immediate complications .Critical Care  Date/Time: 03/10/2021 4:17 PM Performed by: Daleen Bo, MD Authorized by: Daleen Bo, MD   Critical care provider statement:    Critical care time (minutes):  35  Critical care start time:  03/10/2021 1:02 AM   Critical care end time:  03/10/2021 4:18 PM   Critical care time was exclusive of:  Separately billable procedures and treating other patients   Critical care was necessary to treat or prevent imminent or life-threatening deterioration of the following conditions:  CNS failure or compromise   Critical care was time spent personally by me on the following activities:  Blood draw for specimens, development of treatment plan with patient or surrogate, discussions with consultants, evaluation of patient's response to treatment, examination of patient, obtaining history from patient or surrogate, ordering and performing treatments and interventions, ordering and review of laboratory studies, pulse oximetry, re-evaluation of patient's condition, review of old charts and ordering and review of radiographic studies   Medications Ordered in ED Medications   lidocaine-EPINEPHrine (XYLOCAINE W/EPI) 2 %-1:200000 (PF) injection 10 mL (10 mLs Infiltration Given by Other 03/10/21 1600)    ED Course  I have reviewed the triage vital signs and the nursing notes.  Pertinent labs & imaging results that were available during my care of the patient were reviewed by me and considered in my medical decision making (see chart for details).    MDM Rules/Calculators/A&P                           No data found.   4:18 PM Reevaluation with update and discussion. After initial assessment and treatment, an updated evaluation reveals patient stable, she remains alert and lucid.  Findings discussed with patient and all questions were answered. Daleen Bo   Medical Decision Making:  This patient is presenting for evaluation of injuries from fall, which does require a range of treatment options, and is a complaint that involves a moderate risk of morbidity and mortality. The differential diagnoses include contusion, intracranial injury, facial fracture, soft tissue injury. I decided to review old records, and in summary Eh[place] female, fell while walking in her room, likely mechanical.  I did not require additional historical information from anyone.  Radiologic Tests Ordered, included CT head and CT cervical spine.  I independently Visualized: Radiograph images, which show no acute abnormality    Critical Interventions-clinical evaluation, CT imaging, laceration repair, observation reassessment  After These Interventions, the Patient was reevaluated and was found stable for discharge.  Mechanical fall with contusion face/head, laceration.  Laceration forehead require suture closure.  No complicating factors found on evaluation.  Patient safe for discharge with symptomatic care.  Anticipate suture removal in 5  days  CRITICAL CARE-yes Performed by: Daleen Bo  Nursing Notes Reviewed/ Care Coordinated Applicable Imaging Reviewed Interpretation of  Laboratory Data incorporated into ED treatment  The patient appears reasonably screened and/or stabilized for discharge and I doubt any other medical condition or other [facility] requiring further screening, evaluation, or treatment in the ED at this time prior to discharge.  Plan: Home Medications-continue usual; Home Treatments-rest, fluids, wound care; return here if the recommended treatment, does not improve the symptoms; Recommended follow up-PCP or nursing for suture removal in 5 days.     Final Clinical Impression(s) / ED Diagnoses Final diagnoses:  Injury of head, initial encounter  Facial laceration, initial encounter  Fall, initial encounter    Rx / DC Orders ED Discharge Orders     None        Daleen Bo, MD 03/10/21 1621    Daleen Bo, MD 03/10/21 1717     Daleen Bo, MD 03/29/21 [phone]

## 2021-03-10 NOTE — ED Notes (Addendum)
Suture cart and tray at bedside. Lidocaine at bedside.

## 2021-03-13 ENCOUNTER — Non-Acute Institutional Stay: Payer: Medicare Other | Admitting: Nurse Practitioner

## 2021-03-13 DIAGNOSIS — M159 Polyosteoarthritis, unspecified: Secondary | ICD-10-CM | POA: Diagnosis not present

## 2021-03-13 DIAGNOSIS — W19XXXA Unspecified fall, initial encounter: Secondary | ICD-10-CM

## 2021-03-13 DIAGNOSIS — S01111D Laceration without foreign body of right eyelid and periocular area, subsequent encounter: Secondary | ICD-10-CM | POA: Diagnosis not present

## 2021-03-13 DIAGNOSIS — J309 Allergic rhinitis, unspecified: Secondary | ICD-10-CM

## 2021-03-13 DIAGNOSIS — F418 Other specified anxiety disorders: Secondary | ICD-10-CM

## 2021-03-13 DIAGNOSIS — K219 Gastro-esophageal reflux disease without esophagitis: Secondary | ICD-10-CM

## 2021-03-13 DIAGNOSIS — I10 Essential (primary) hypertension: Secondary | ICD-10-CM

## 2021-03-13 DIAGNOSIS — K5909 Other constipation: Secondary | ICD-10-CM

## 2021-03-13 NOTE — Progress Notes (Signed)
Location:   [facility] Room Number: 364 Place of Service:  ALF (13) Provider: Lennie Odor Caliope Ruppert NP  [name], MD  Patient Care Team: [name], MD as PCP - General (Internal Medicine) [name], MD (Ophthalmology) [name], MD (Psychiatry) Fumiko Cham X, NP as Nurse Practitioner (Nurse Practitioner) [name], MD as Consulting Physician (Orthopedic Surgery)  Extended Emergency Contact Information Primary Emergency Contact: [name], [place] Work Phone: [phone] Relation: Son Secondary Emergency Contact: [name] [place] Phone: [phone] Relation: Sister  Code Status: DNR Goals of care: Advanced Directive information Advanced Directives 03/10/2021  Does Patient Have a Medical Advance Directive? Yes  Type of Advance Directive Out of facility DNR (pink MOST or yellow form)  Does patient want to make changes to medical advance directive? -  Copy of [place] in Chart? -  Pre-existing out of facility DNR order (yellow form or pink MOST form) -     Chief Complaint  Patient presents with   Acute Visit    F/u ED eval for fall 03/10/21    HPI:  Pt is a 80 y.o. female seen today for an acute visit for f/u ED eval for fall, no acute process per CT head/cervical spine. Sutures x4 in middle of the right eyebrow intact, no s/s of infection or active bleeding, slightly peri orbital ecchymoses noted. The patient denied headache, dizziness, change of vision, chest pain/palpitation, or SOB     HTN, on Furosemide 76m qd, Spironolactone 12.515mqd, ASA 814md,  Bun/creat 17/1.0 10/06/20             OA on Tylenol 325m24md/prn, Norco 5/325mg76m             GERD, stable, on Famotidine 20mg 72mMaalox qd/prn,  Hgb 12.7 10/06/20             Depression/anxiety, on Amitriptyline 50mg q31mibrium 25mg qd73muphenazine 1mg qd, 57mazepam 0.5mg bid, 20m 2.76 10/06/20              Chronic constipation, on prn Dulcolax po/suppository, Linzess, prn MOM, MiraLax bid/prn             Allergic rhinitis, takes Claritin, Flonase, Mucinex         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, 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   Vitamin D deficiency    Past Surgical History:  Procedure Laterality Date   ABDOMINAL HYSTERECTOMY     ANAL RECTAL MANOMETRY N/A 07/05/2014   Procedure: ANO RECTAL MANOMETRY;  Surgeon: Alicia ThoLeighton Ruffation: [facility];  Service: Endoscopy;  Laterality: N/A;   APPENDECTOMY     CAROTID ENDARTERECTOMY     COLONOSCOPY  2008, 2015   TONSILLECTOMY     UPPER GASTROINTESTINAL ENDOSCOPY  2002    Allergies  Allergen Reactions   Biaxin [Clarithromycin]    Clarithromycin    Doxycycline    Fosamax [Alendronate Sodium]    Geodon [Ziprasidone Hydrochloride]    Lipitor [Atorvastatin Calcium]    Penicillins    Pravachol [Pravastatin Sodium]    Statins    Sulfonamide Derivatives    Zithromax [Azithromycin]    Zocor [Simvastatin]     Allergies as of 03/13/2021       Reactions   Biaxin [clarithromycin]    Clarithromycin    Doxycycline    Fosamax [alendronate Sodium]    Geodon [ziprasidone Hydrochloride]    Lipitor [atorvastatin Calcium]    Penicillins    Pravachol [pravastatin Sodium]    Statins    Sulfonamide Derivatives    Zithromax [azithromycin]    Zocor  [simvastatin]         Medication List        Accurate as of March 13, 2021 11:59 PM. If you have any questions, ask your nurse or doctor.          Tylenol 325 MG Caps Generic drug: Acetaminophen Take 325 mg by mouth in the morning and at bedtime.   acetaminophen 325 MG tablet Commonly known as: TYLENOL Take 650 mg by mouth every 8 (eight) hours as needed. 8am & 8pm AS NEEDED FOR PAIN *NOT TO EXCEED 3,000 MG IN 24HRS*   alum & mag hydroxide-simeth 462-703-50 MG/5ML suspension Commonly known as: MAALOX/MYLANTA Take 30 mLs by mouth at bedtime. 9pm   alum & mag hydroxide-simeth 200-200-20 MG/5ML suspension Commonly known as: MAALOX/MYLANTA Take 30 mLs by mouth every 4 (four) hours as needed for indigestion or heartburn.   amitriptyline 50 MG tablet Commonly known as: ELAVIL Take 50 mg by mouth at bedtime. 9pm   ARTIFICIAL TEAR OP Apply 1 drop to eye 3 (three) times daily. Both Eyes at 8am, 2pm, and 9pm.   aspirin 81 MG EC tablet Take 81 mg by mouth daily. 9am   betamethasone valerate 0.1 % cream Commonly known as: VALISONE Apply 1 application topically as needed. Apply  thin amount to vulva twice daily.   Biofreeze 4 % Gel Generic drug: Menthol (Topical Analgesic) Apply topically in the morning, at noon, in the evening, and at bedtime. Apply to knee   bisacodyl 10 MG suppository Commonly known as: DULCOLAX Place 10 mg rectally daily as needed for moderate constipation.   bisacodyl 5 MG EC tablet Commonly known as: DULCOLAX Take 5 mg by mouth daily as needed for moderate constipation.   camphor-menthol lotion Commonly known as: SARNA Apply topically. 0.5-0.5 % Three times a day as needed to mid back   carbamide peroxide 6.5 % OTIC solution Commonly known as: DEBROX Place 5 drops to affected ear every night for 3 days, then irrigate with bulb syringe. If not effective, notify MD.   CENTRUM SILVER PO Take 1 tablet by mouth. 8am   chlordiazePOXIDE 25 MG  capsule Commonly known as: LIBRIUM Take one capsule by mouth at bedtime   famotidine 20 MG tablet Commonly known as: PEPCID Take 20 mg by mouth at bedtime. 8pm   fluPHENAZine 1 MG tablet Commonly known as: PROLIXIN Take 1 mg by mouth at bedtime. 8pm.   fluticasone 50 MCG/ACT nasal spray Commonly known as: FLONASE Place 1 spray into both  nostrils 2 (two) times daily. 8am & 8pm.   furosemide 20 MG tablet Commonly known as: LASIX Take 20 mg by mouth daily. 8am   guaiFENesin 600 MG 12 hr tablet Commonly known as: MUCINEX Take 600 mg by mouth 2 (two) times daily. (1) tab, oral, As Needed, Administer med as needed for cold/congestion.   HYDROcodone-acetaminophen 5-325 MG tablet Commonly known as: Norco Take 1 tablet by mouth daily.   hydrocortisone 2.5 % cream Apply 1 application topically 2 (two) times daily as needed. Apply to affected areas on face.   hydrocortisone cream 1 % Apply 1 application topically 2 (two) times daily. May self apply twice daily to Hemorrhoids.   Ketoconazole-Hydrocortisone 2-2.5 % Crea Apply 1 application topically as needed. Apply to face for rash one time daily as needed.   Linzess 290 MCG Caps capsule Generic drug: linaclotide Take 290 mcg by mouth daily. Take by mouth 51mnutes before breakfast on an empty stomach at 7:30am. Be sure to swallow whole   loratadine 10 MG tablet Commonly known as: CLARITIN Take 10 mg by mouth in the morning and at bedtime.   LORazepam 0.5 MG tablet Commonly known as: ATIVAN Take 1 tablet (0.5 mg total) by mouth 2 (two) times daily.   magnesium hydroxide 400 MG/5ML suspension Commonly known as: MILK OF MAGNESIA Take 30 mLs by mouth daily as needed.   MIRALAX PO Take 17 g by mouth daily as needed.   polyethylene glycol 17 g packet Commonly known as: MIRALAX / GLYCOLAX Take 17 g by mouth in the morning and at bedtime.   mometasone 0.1 % cream Commonly known as: ELOCON May self administer to face twice  daily as needed.   ORAL GEL ANESTHETIC MT Use as directed in the mouth or throat every 4 (four) hours as needed.   OYSTER SHELL PO Take 250 mg by mouth in the morning and at bedtime.   saccharomyces boulardii 250 MG capsule Commonly known as: FLORASTOR Take 250 mg by mouth 2 (two) times daily. 8am   spironolactone 25 MG tablet Commonly known as: ALDACTONE Take 12.5 mg by mouth daily.   triamcinolone cream 0.5 % Commonly known as: KENALOG Apply 1 application topically 2 (two) times daily. Apply to back for itching in the morning and evening.        Review of Systems  Constitutional:  Negative for activity change, appetite change and fever.       Weight loss #6Ibs in 3 months.   HENT:  Positive for hearing loss. Negative for congestion, rhinorrhea and voice change.        Hoarseness  Eyes:  Positive for visual disturbance.       Low vision  Respiratory:  Negative for cough, shortness of breath and wheezing.   Cardiovascular:  Negative for leg swelling.  Gastrointestinal:  Negative for abdominal pain and constipation.  Genitourinary:  Negative for dysuria and frequency.  Musculoskeletal:  Positive for arthralgias and gait problem. Negative for back pain.       OA is under control  Skin:  Positive for rash. Negative for color change.       Right eyebrow laceration, suture closure is intact.   Neurological:  Negative for dizziness, speech difficulty, weakness and headaches.       Memory lapses  Psychiatric/Behavioral:  Negative for behavioral problems and sleep disturbance. The patient is not nervous/anxious.    Immunization History  Administered Date(s) Administered   Influenza Split 07/23/2013   Influenza Whole 07/05/2009, 06/26/2018  Influenza-Unspecified 07/29/2014, 06/14/2015, 07/11/2016, 07/15/2017, 06/27/2019, 07/05/2020   Moderna Sars-Covid-2 Vaccination 09/26/2019, 10/24/2019, 08/02/2020   PFIZER(Purple Top)SARS-COV-2 Vaccination 09/26/2019   PPD Test 06/17/2013    Pneumococcal Conjugate-13 07/22/2017   Pneumococcal Polysaccharide-23 05/05/2009   Td 05/05/2009, 07/16/2019   Zoster, Live 01/09/2011   Pertinent  Health Maintenance Due  Topic Date Due   INFLUENZA VACCINE  04/24/2021   DEXA SCAN  Completed   PNA vac Low Risk Adult  Completed   Fall Risk  06/10/2018 06/04/2017 07/29/2014  Falls in the past year? No Yes No  Number falls in past yr: - 1 -  Injury with Fall? - No -   Functional Status Survey:    Vitals:   03/13/21 1014  BP: 118/68  Pulse: 72  Resp: 18  Temp: 98 F (36.7 C)  SpO2: 97%   There is no height or weight on file to calculate BMI. Physical Exam Constitutional:      Appearance: Normal appearance.  HENT:     Head: Normocephalic.     Comments: No erythema or drainage in thorat    Nose: Nose normal.     Mouth/Throat:     Mouth: Mucous membranes are moist.  Eyes:     Extraocular Movements: Extraocular movements intact.     Conjunctiva/sclera: Conjunctivae normal.     Pupils: Pupils are equal, round, and reactive to light.     Comments: Legally blind  Cardiovascular:     Rate and Rhythm: Normal rate and regular rhythm.     Heart sounds: No murmur heard. Pulmonary:     Effort: Pulmonary effort is normal.     Breath sounds: No rales.  Abdominal:     General: Bowel sounds are normal.     Palpations: Abdomen is soft.     Tenderness: There is no abdominal tenderness.  Musculoskeletal:     Cervical back: Normal range of motion and neck supple.     Right lower leg: No edema.     Left lower leg: No edema.  Skin:    General: Skin is warm and dry.     Findings: Bruising present.     Comments: Right eyebrow laceration, suture x4 closure is intact, no s/s of infection or active bleeding. Mild ecchymoses right periorbit  Neurological:     General: No focal deficit present.     Mental Status: She is alert. Mental status is at baseline.     Gait: Gait abnormal.     Comments: Oriented to person, place.    Psychiatric:        Mood and Affect: Mood normal.        Behavior: Behavior normal.        Thought Content: Thought content normal.    Labs reviewed: Recent Labs    10/06/20 0000  NA 141  K 4.4  CL 105  CO2 25*  BUN 17  CREATININE 1.0  CALCIUM 9.4   Recent Labs    10/06/20 0000  AST 23  ALT 15  ALKPHOS 94  ALBUMIN 4.1   Recent Labs    10/06/20 0000  WBC 7.4  NEUTROABS 3,419.00  HGB 12.7  HCT 37  PLT 300   Lab Results  Component Value Date   TSH 2.76 10/06/2020   Lab Results  Component Value Date   HGBA1C 5.9 02/05/2017   Lab Results  Component Value Date   CHOL 247 (A) 11/21/2018   HDL 72 (A) 11/21/2018   LDLCALC 147 11/21/2018   LDLDIRECT  190.0 03/24/2012   TRIG 150 11/21/2018   CHOLHDL 4 03/24/2012    Significant Diagnostic Results in last 30 days:  CT Head Wo Contrast  Result Date: 03/10/2021 CLINICAL DATA:  Status post trip and fall with a blow to the head. Initial encounter. EXAM: CT HEAD WITHOUT CONTRAST CT CERVICAL SPINE WITHOUT CONTRAST TECHNIQUE: Multidetector CT imaging of the head and cervical spine was performed following the standard protocol without intravenous contrast. Multiplanar CT image reconstructions of the cervical spine were also generated. COMPARISON:  Head and cervical spine CT scans 03/31/2013. FINDINGS: CT HEAD FINDINGS Brain: No evidence of acute infarction, hemorrhage, hydrocephalus, extra-axial collection or mass lesion/mass effect. Vascular: No hyperdense vessel or unexpected calcification. Skull: Intact.  No focal lesion. Sinuses/Orbits: Status post cataract surgery.  Otherwise negative. Other: None. CT CERVICAL SPINE FINDINGS Alignment: Maintained. Skull base and vertebrae: No acute fracture. No primary bone lesion or focal pathologic process. Soft tissues and spinal canal: No prevertebral fluid or swelling. No visible canal hematoma. Disc levels: Loss of disc space height is most notable at C5-6 where there is vacuum disc  phenomenon. Mild disc bulging C3-4 and C4-5 also noted. Upper chest: Negative. Other: None. IMPRESSION: Negative head CT. No acute abnormality cervical spine. Mild appearing cervical degenerative change. Electronically Signed   By: Inge Rise M.D.   On: 03/10/2021 14:35   CT Cervical Spine Wo Contrast  Result Date: 03/10/2021 CLINICAL DATA:  Status post trip and fall with a blow to the head. Initial encounter. EXAM: CT HEAD WITHOUT CONTRAST CT CERVICAL SPINE WITHOUT CONTRAST TECHNIQUE: Multidetector CT imaging of the head and cervical spine was performed following the standard protocol without intravenous contrast. Multiplanar CT image reconstructions of the cervical spine were also generated. COMPARISON:  Head and cervical spine CT scans 03/31/2013. FINDINGS: CT HEAD FINDINGS Brain: No evidence of acute infarction, hemorrhage, hydrocephalus, extra-axial collection or mass lesion/mass effect. Vascular: No hyperdense vessel or unexpected calcification. Skull: Intact.  No focal lesion. Sinuses/Orbits: Status post cataract surgery.  Otherwise negative. Other: None. CT CERVICAL SPINE FINDINGS Alignment: Maintained. Skull base and vertebrae: No acute fracture. No primary bone lesion or focal pathologic process. Soft tissues and spinal canal: No prevertebral fluid or swelling. No visible canal hematoma. Disc levels: Loss of disc space height is most notable at C5-6 where there is vacuum disc phenomenon. Mild disc bulging C3-4 and C4-5 also noted. Upper chest: Negative. Other: None. IMPRESSION: Negative head CT. No acute abnormality cervical spine. Mild appearing cervical degenerative change. Electronically Signed   By: Inge Rise M.D.   On: 03/10/2021 14:35    Assessment/Plan: Eyebrow laceration, right, subsequent encounter no acute process per CT head/cervical spine. Sutures x4 in middle of the right eyebrow intact, no s/s of infection or active bleeding, slightly peri orbital ecchymoses noted. The  patient denied headache, dizziness, change of vision, chest pain/palpitation, or SOB Remove sutures 5-7 days.   Fall Pumped into her furniture in her room, poor vision and unsteady gait are contributory. Close supervision, assistance needed for safety.  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   GERD (gastroesophageal reflux disease) stable, on Famotidine 20mg 25mMaalox qd/prn,  Hgb 12.7 10/06/20  Depression with anxiety on Amitriptyline 50mg q36mibrium 25mg qd55muphenazine 1mg84m  qd, Lorazepam 0.28m bid, TSH 2.76 10/06/20  Constipation on prn Dulcolax po/suppository, Linzess, prn MOM, MiraLax bid/prn  Allergic rhinitis Stable, takes Claritin, Flonase, Mucinex            Family/ staff Communication: plan of care reviewed with the patient and charge nurse.   Labs/tests ordered: CBC/diff, CMP/eGFR  Time spend 40 minutes.

## 2021-03-14 ENCOUNTER — Encounter: Payer: Self-pay | Admitting: Nurse Practitioner

## 2021-03-14 DIAGNOSIS — S01111D Laceration without foreign body of right eyelid and periocular area, subsequent encounter: Secondary | ICD-10-CM | POA: Insufficient documentation

## 2021-03-14 DIAGNOSIS — J309 Allergic rhinitis, unspecified: Secondary | ICD-10-CM | POA: Insufficient documentation

## 2021-03-14 NOTE — Assessment & Plan Note (Signed)
Blood pressure is controlled, on Furosemide 20mg  qd, Spironolactone 12.5mg  qd, ASA 81mg  qd,  Bun/creat 17/1.0 10/06/20

## 2021-03-14 NOTE — Assessment & Plan Note (Signed)
Stable, takes Claritin, Flonase, Mucinex        

## 2021-03-14 NOTE — Assessment & Plan Note (Addendum)
Pumped into her furniture in her room, poor vision and unsteady gait are contributory. Close supervision, assistance needed for safety.  Update CBC/diff, CMP/eGFR

## 2021-03-14 NOTE — Assessment & Plan Note (Signed)
on prn Dulcolax po/suppository, Linzess, prn MOM, MiraLax bid/prn

## 2021-03-14 NOTE — Assessment & Plan Note (Signed)
on Tylenol 325mg  bid/prn, Norco 5/325mg  qd, the right knee pain is at her baseline.

## 2021-03-14 NOTE — Assessment & Plan Note (Signed)
stable, on Famotidine 20mg  qd, Maalox qd/prn,  Hgb 12.7 10/06/20

## 2021-03-14 NOTE — Assessment & Plan Note (Signed)
no acute process per CT head/cervical spine. Sutures x4 in middle of the right eyebrow intact, no s/s of infection or active bleeding, slightly peri orbital ecchymoses noted. The patient denied headache, dizziness, change of vision, chest pain/palpitation, or SOB Remove sutures 5-7 days.

## 2021-03-14 NOTE — Assessment & Plan Note (Signed)
on Amitriptyline 50mg  qd, Librium 25mg  qd, Fluphenazine 1mg  qd, Lorazepam 0.5mg  bid, TSH 2.76 10/06/20

## 2021-03-15 LAB — CBC AND DIFFERENTIAL
HCT: 36 (ref 36–46)
Hemoglobin: 12.2 (ref 12.0–16.0)
Neutrophils Absolute: 2877
Platelets: 335 (ref 150–399)
WBC: 5.5

## 2021-03-15 LAB — BASIC METABOLIC PANEL
BUN: 22 — AB (ref 4–21)
CO2: 24 — AB (ref 13–22)
Chloride: 104 (ref 99–108)
Creatinine: 1 (ref 0.5–1.1)
Glucose: 77
Potassium: 4.4 (ref 3.4–5.3)
Sodium: 140 (ref 137–147)

## 2021-03-15 LAB — COMPREHENSIVE METABOLIC PANEL
Albumin: 4.3 (ref 3.5–5.0)
Calcium: 9.7 (ref 8.7–10.7)
Globulin: 2.9

## 2021-03-15 LAB — HEPATIC FUNCTION PANEL
ALT: 19 (ref 7–35)
AST: 25 (ref 13–35)
Alkaline Phosphatase: 92 (ref 25–125)
Bilirubin, Total: 0.3

## 2021-03-15 LAB — CBC: RBC: 3.93 (ref 3.87–5.11)

## 2021-03-29 ENCOUNTER — Encounter: Payer: Self-pay | Admitting: Nurse Practitioner

## 2021-03-29 DIAGNOSIS — F0390 Unspecified dementia without behavioral disturbance: Secondary | ICD-10-CM | POA: Insufficient documentation

## 2021-05-05 ENCOUNTER — Other Ambulatory Visit: Payer: Self-pay

## 2021-05-05 MED ORDER — HYDROCODONE-ACETAMINOPHEN 5-325 MG PO TABS: 1.0000 | ORAL_TABLET | Freq: Every day | ORAL | 0 refills | Status: DC

## 2021-05-05 NOTE — Telephone Encounter (Signed)
Refill request received from [facility] for Norco 5/325 mg tablet. Take 1 tablet daily in the morning. #30.  Medication pended and sent to Man X Mast, NP for approval.

## 2021-05-10 ENCOUNTER — Other Ambulatory Visit: Payer: Self-pay

## 2021-05-10 MED ORDER — CHLORDIAZEPOXIDE HCL 25 MG PO CAPS: ORAL_CAPSULE | ORAL | 1 refills | Status: DC

## 2021-05-10 NOTE — Telephone Encounter (Signed)
Refill request received from [facility] for chlordiazepoxide 25 mg 1 every evening at bedtime #30.  Medication pended and sent to Man X, NP for approval.

## 2021-05-11 ENCOUNTER — Other Ambulatory Visit: Payer: Self-pay | Admitting: *Deleted

## 2021-05-11 MED ORDER — LORAZEPAM 0.5 MG PO TABS: 0.5000 mg | ORAL_TABLET | Freq: Two times a day (BID) | ORAL | 0 refills | Status: DC

## 2021-05-11 NOTE — Telephone Encounter (Signed)
[facility] Requested refill  Pended Rx and sent to [facility] for approval.

## 2021-05-16 ENCOUNTER — Encounter: Payer: Self-pay | Admitting: Internal Medicine

## 2021-05-16 ENCOUNTER — Non-Acute Institutional Stay: Payer: Medicare Other | Admitting: Internal Medicine

## 2021-05-16 DIAGNOSIS — I10 Essential (primary) hypertension: Secondary | ICD-10-CM

## 2021-05-16 DIAGNOSIS — M545 Low back pain, unspecified: Secondary | ICD-10-CM | POA: Diagnosis not present

## 2021-05-16 DIAGNOSIS — M159 Polyosteoarthritis, unspecified: Secondary | ICD-10-CM | POA: Diagnosis not present

## 2021-05-16 DIAGNOSIS — F418 Other specified anxiety disorders: Secondary | ICD-10-CM

## 2021-05-16 DIAGNOSIS — K219 Gastro-esophageal reflux disease without esophagitis: Secondary | ICD-10-CM

## 2021-05-16 DIAGNOSIS — M8000XA Age-related osteoporosis with current pathological fracture, unspecified site, initial encounter for fracture: Secondary | ICD-10-CM

## 2021-05-16 DIAGNOSIS — E782 Mixed hyperlipidemia: Secondary | ICD-10-CM

## 2021-05-16 NOTE — Progress Notes (Signed)
Location:   [place] Room Number: [place] of Service:  [facility] [phone]) Provider:  Veleta Miners MD   [name], MD  Patient Care Team: [name], MD as PCP - General (Internal Medicine) [name], MD (Ophthalmology) [name], MD (Psychiatry) Mast, Man X, NP as Nurse Practitioner (Nurse Practitioner) [name], MD as Consulting Physician (Orthopedic Surgery)  Extended Emergency Contact Information Primary Emergency Contact: [name], [place] Work Phone: [phone] Relation: Son Secondary Emergency Contact: [name] [place] Phone: [phone] Relation: Sister  Code Status:  DNR Managed Care Goals of care: Advanced Directive information Advanced Directives 05/16/2021  Does Patient Have a Medical Advance Directive? Yes  Type of Advance Directive Out of facility DNR (pink MOST or yellow form);Healthcare Power of Attorney  Does patient want to make changes to medical advance directive? No - Patient declined  Copy of [place] in Chart? Yes - validated most recent copy scanned in chart (See row information)  Pre-existing out of facility DNR order (yellow form or pink MOST form) Yellow form placed in chart (order not valid for inpatient use);Pink MOST form placed in chart (order not valid for inpatient use)     Chief Complaint  Patient presents with   Medical Management of Chronic Issues   Quality Metric Gaps    Shingrix, #4 Covid, flu shot    HPI:  Pt is a 80 y.o. female seen today for medical management of chronic diseases.    [place] of [place]   She has h/o Hypertension, Osteoarthritis, GERD , Constipation , Bilateral Visual Loss and  Anxiety with Depression  Doing well. Had one fall few months ago but since then doing well Has gained Weight  Mood is stable No nurisng issues Her only complain today was some left sided  Lower back pain More like Para spinal area No Falls walking with her walker No Red flag signs  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, 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   Vitamin D deficiency    Past Surgical History:  Procedure Laterality Date   ABDOMINAL HYSTERECTOMY     ANAL RECTAL MANOMETRY N/A 07/05/2014   Procedure: ANO RECTAL MANOMETRY;  Surgeon: Leighton Ruff, MD;  Location: [facility];  Service: Endoscopy;  Laterality: N/A;   APPENDECTOMY     CAROTID ENDARTERECTOMY     COLONOSCOPY  2008, 2015   TONSILLECTOMY     UPPER GASTROINTESTINAL ENDOSCOPY  2002    Allergies  Allergen Reactions   Biaxin [Clarithromycin]    Clarithromycin    Doxycycline    Fosamax [Alendronate  Sodium]    Geodon [Ziprasidone Hydrochloride]    Lipitor [Atorvastatin Calcium]    Penicillins    Pravachol [Pravastatin Sodium]    Statins    Sulfonamide Derivatives    Zithromax [Azithromycin]    Zocor [Simvastatin]     Allergies as of 05/16/2021       Reactions   Biaxin [clarithromycin]    Clarithromycin    Doxycycline    Fosamax [alendronate Sodium]    Geodon [ziprasidone Hydrochloride]    Lipitor [atorvastatin Calcium]    Penicillins    Pravachol [pravastatin Sodium]    Statins    Sulfonamide Derivatives    Zithromax [azithromycin]    Zocor [simvastatin]         Medication List        Accurate as of  May 16, 2021  3:22 PM. If you have any questions, ask your nurse or doctor.          Tylenol 325 MG Caps Generic drug: Acetaminophen Take 325 mg by mouth in the morning and at bedtime.   acetaminophen 325 MG tablet Commonly known as: TYLENOL Take 650 mg by mouth every 8 (eight) hours as needed. 8am & 8pm AS NEEDED FOR PAIN *NOT TO EXCEED 3,000 MG IN 24HRS*   alum & mag hydroxide-simeth [id number] MG/5ML suspension Commonly known as: MAALOX/MYLANTA Take 30 mLs by mouth at bedtime. 9pm   alum & mag hydroxide-simeth 200-200-20 MG/5ML suspension Commonly known as: MAALOX/MYLANTA Take 30 mLs by mouth every 4 (four) hours as needed for indigestion or heartburn.   amitriptyline 50 MG tablet Commonly known as: ELAVIL Take 50 mg by mouth at bedtime. 9pm   ARTIFICIAL TEAR OP Apply 1 drop to eye 3 (three) times daily. Both Eyes at 8am, 2pm, and 9pm.   aspirin 81 MG EC tablet Take 81 mg by mouth daily. 9am   betamethasone valerate 0.1 % cream Commonly known as: VALISONE Apply 1 application topically as needed. Apply  thin amount to vulva twice daily.   Biofreeze 4 % Gel Generic drug: Menthol (Topical Analgesic) Apply topically in the morning, at noon, in the evening, and at bedtime. Apply to knee   bisacodyl 10 MG suppository Commonly known as: DULCOLAX Place 10 mg rectally daily as needed for moderate constipation.   bisacodyl 5 MG EC tablet Commonly known as: DULCOLAX Take 5 mg by mouth daily as needed for moderate constipation.   camphor-menthol lotion Commonly known as: SARNA Apply topically. 0.5-0.5 % Three times a day as needed to mid back   carbamide peroxide 6.5 % OTIC solution Commonly known as: DEBROX Place 5 drops to affected ear every night for 3 days, then irrigate with bulb syringe. If not effective, notify MD.   CENTRUM SILVER PO Take 1 tablet by mouth. 8am   chlordiazePOXIDE 25 MG capsule Commonly known as: LIBRIUM Take one capsule by mouth at  bedtime   famotidine 20 MG tablet Commonly known as: PEPCID Take 20 mg by mouth at bedtime. 8pm   fluPHENAZine 1 MG tablet Commonly known as: PROLIXIN Take 1 mg by mouth at bedtime. 8pm.   fluticasone 50 MCG/ACT nasal spray Commonly known as: FLONASE Place 1 spray into both nostrils 2 (two) times daily. 8am & 8pm.   furosemide 20 MG tablet Commonly known as: LASIX Take 20 mg by mouth daily. 8am   guaiFENesin 600 MG 12 hr tablet Commonly known as: MUCINEX Take 600 mg by mouth 2 (two) times daily. (1) tab, oral, As Needed, Administer med as  needed for cold/congestion.   HYDROcodone-acetaminophen 5-325 MG tablet Commonly known as: Norco Take 1 tablet by mouth daily.   hydrocortisone 2.5 % cream Apply 1 application topically 2 (two) times daily as needed. Apply to affected areas on face.   hydrocortisone cream 1 % Apply 1 application topically 2 (two) times daily. May self apply twice daily to Hemorrhoids.   ketoconazole 2 % cream Commonly known as: NIZORAL Apply 1 application topically daily as needed for irritation.   Ketoconazole-Hydrocortisone 2-2.5 % Crea Apply 1 application topically as needed. Apply to face for rash one time daily as needed.   Linzess 290 MCG Caps capsule Generic drug: linaclotide Take 290 mcg by mouth daily. Take by mouth 75mnutes before breakfast on an empty stomach at 7:30am. Be sure to swallow whole   loratadine 10 MG tablet Commonly known as: CLARITIN Take 10 mg by mouth in the morning and at bedtime.   LORazepam 0.5 MG tablet Commonly known as: ATIVAN Take 1 tablet (0.5 mg total) by mouth 2 (two) times daily.   magnesium hydroxide 400 MG/5ML suspension Commonly known as: MILK OF MAGNESIA Take 30 mLs by mouth daily as needed.   MIRALAX PO Take 17 g by mouth daily as needed.   polyethylene glycol 17 g packet Commonly known as: MIRALAX / GLYCOLAX Take 17 g by mouth in the morning and at bedtime.   mometasone 0.1 % cream Commonly  known as: ELOCON May self administer to face twice daily as needed.   ORAL GEL ANESTHETIC MT Use as directed in the mouth or throat every 4 (four) hours as needed.   OYSTER SHELL PO Take 250 mg by mouth in the morning and at bedtime.   saccharomyces boulardii 250 MG capsule Commonly known as: FLORASTOR Take 250 mg by mouth 2 (two) times daily. 8am   spironolactone 25 MG tablet Commonly known as: ALDACTONE Take 12.5 mg by mouth daily.   triamcinolone cream 0.5 % Commonly known as: KENALOG Apply 1 application topically 2 (two) times daily. Apply to back for itching in the morning and evening.        Review of Systems Review of Systems  Constitutional: Negative for activity change, appetite change, chills, diaphoresis, fatigue and fever.  HENT: Negative for mouth sores, postnasal drip, rhinorrhea, sinus pain and sore throat.   Respiratory: Negative for apnea, cough, chest tightness, shortness of breath and wheezing.   Cardiovascular: Negative for chest pain, palpitations and leg swelling.  Gastrointestinal: Negative for abdominal distention, abdominal pain, constipation, diarrhea, nausea and vomiting.  Genitourinary: Negative for dysuria and frequency.  Musculoskeletal: Negative for arthralgias, joint swelling and myalgias.  Skin: Negative for rash.  Neurological: Negative for dizziness, syncope, weakness, light-headedness and numbness.  Psychiatric/Behavioral: Negative for behavioral problems, confusion and sleep disturbance.    Immunization History  Administered Date(s) Administered   Influenza Split 07/23/2013   Influenza Whole 07/05/2009, 06/26/2018   Influenza-Unspecified 07/29/2014, 06/14/2015, 07/11/2016, 07/15/2017, 06/27/2019, 07/05/2020   Moderna Sars-Covid-2 Vaccination 09/26/2019, 10/24/2019, 08/02/2020   PFIZER(Purple Top)SARS-COV-2 Vaccination 09/26/2019   PPD Test 06/17/2013   Pneumococcal Conjugate-13 07/22/2017   Pneumococcal Polysaccharide-23 05/05/2009    Td 05/05/2009, 07/16/2019   Zoster, Live 01/09/2011   Pertinent  Health Maintenance Due  Topic Date Due   INFLUENZA VACCINE  04/24/2021   DEXA SCAN  Completed   PNA vac Low Risk Adult  Completed   Fall Risk  06/10/2018 06/04/2017 07/29/2014  Falls in the past year? No Yes No  Number falls in past yr: -  1 -  Injury with Fall? - No -   Functional Status Survey:    Vitals:   05/16/21 1510  BP: (!) 142/70  Pulse: 78  Resp: 19  Temp: (!) 96.7 F (35.9 C)  SpO2: 97%  Weight: 135 lb (61.2 kg)  Height: '5\' 6"'$  (1.676 m)   Body mass index is 21.79 kg/m. Physical Exam Constitutional: Oriented to person, place, and time. Well-developed and well-nourished.  HENT:  Head: Normocephalic.  Mouth/Throat: Oropharynx is clear and moist.  Eyes: Pupils are equal, round, and reactive to light.  Neck: Neck supple.  Cardiovascular: Normal rate and normal heart sounds.  No murmur heard. Pulmonary/Chest: Effort normal and breath sounds normal. No respiratory distress. No wheezes. She has no rales.  Abdominal: Soft. Bowel sounds are normal. No distension. There is no tenderness. There is no rebound.  Musculoskeletal: No edema.  Has mild tenderness in Parapinal area in lower back  Walking with her walker and no focal deficits Lymphadenopathy: none Neurological: Alert and oriented to person, place, and time.  Skin: Skin is warm and dry.  Psychiatric: Normal mood and affect. Behavior is normal. Thought content normal.   Labs reviewed: Recent Labs    10/06/20 0000 03/15/21 0000  NA 141 140  K 4.4 4.4  CL 105 104  CO2 25* 24*  BUN 17 22*  CREATININE 1.0 1.0  CALCIUM 9.4 9.7   Recent Labs    10/06/20 0000 03/15/21 0000  AST 23 25  ALT 15 19  ALKPHOS 94 92  ALBUMIN 4.1 4.3   Recent Labs    10/06/20 0000 03/15/21 0000  WBC 7.4 5.5  NEUTROABS 3,419.00 2,877.00  HGB 12.7 12.2  HCT 37 36  PLT 300 335   Lab Results  Component Value Date   TSH 2.76 10/06/2020   Lab Results   Component Value Date   HGBA1C 5.9 02/05/2017   Lab Results  Component Value Date   CHOL 247 (A) 11/21/2018   HDL 72 (A) 11/21/2018   LDLCALC 147 11/21/2018   LDLDIRECT 190.0 03/24/2012   TRIG 150 11/21/2018   CHOLHDL 4 03/24/2012    Significant Diagnostic Results in last 30 days:  No results found.  Assessment/Plan Acute left-sided low back pain without sciatica Will try Voltaren gel BID  If no Improvement the Imaging  Essential hypertension On Low dose of Lasix and Aldactone 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 Tylenol PRN Gastroesophageal reflux disease, unspecified whether esophagitis present Continue Pepcid Depression with anxiety Follows with Psych On Prolixin and  Librium and Elavil  Mixed hyperlipidemia Not on statin States as allergy Age-related osteoporosis r Was on Prolia at some point stopped by son due to cost ? Allegic to fosamax  Constipation  On Linzess  Family/ staff Communication:   Labs/tests ordered:

## 2021-06-08 ENCOUNTER — Other Ambulatory Visit: Payer: Self-pay | Admitting: Orthopedic Surgery

## 2021-06-08 DIAGNOSIS — F419 Anxiety disorder, unspecified: Secondary | ICD-10-CM

## 2021-06-08 MED ORDER — CHLORDIAZEPOXIDE HCL 25 MG PO CAPS: ORAL_CAPSULE | ORAL | 1 refills | Status: DC

## 2021-06-08 MED ORDER — LORAZEPAM 0.5 MG PO TABS: 0.5000 mg | ORAL_TABLET | Freq: Two times a day (BID) | ORAL | 0 refills | Status: DC

## 2021-08-11 ENCOUNTER — Non-Acute Institutional Stay: Payer: Medicare Other | Admitting: Nurse Practitioner

## 2021-08-11 ENCOUNTER — Encounter: Payer: Self-pay | Admitting: Nurse Practitioner

## 2021-08-11 DIAGNOSIS — I10 Essential (primary) hypertension: Secondary | ICD-10-CM | POA: Diagnosis not present

## 2021-08-11 DIAGNOSIS — K5909 Other constipation: Secondary | ICD-10-CM

## 2021-08-11 DIAGNOSIS — R635 Abnormal weight gain: Secondary | ICD-10-CM | POA: Diagnosis not present

## 2021-08-11 DIAGNOSIS — F418 Other specified anxiety disorders: Secondary | ICD-10-CM

## 2021-08-11 DIAGNOSIS — K219 Gastro-esophageal reflux disease without esophagitis: Secondary | ICD-10-CM

## 2021-08-11 DIAGNOSIS — M159 Polyosteoarthritis, unspecified: Secondary | ICD-10-CM

## 2021-08-11 DIAGNOSIS — J309 Allergic rhinitis, unspecified: Secondary | ICD-10-CM

## 2021-08-11 DIAGNOSIS — M8000XA Age-related osteoporosis with current pathological fracture, unspecified site, initial encounter for fracture: Secondary | ICD-10-CM

## 2021-08-11 DIAGNOSIS — L309 Dermatitis, unspecified: Secondary | ICD-10-CM

## 2021-08-11 NOTE — Progress Notes (Signed)
Location:   [facility] Room Number: [place] of Service:  [phone]) Provider:  Letzy Gullickson X, NP  [name], MD  Patient Care Team: [name], MD as PCP - General (Internal Medicine) [name], MD (Ophthalmology) [name], MD (Psychiatry) Aimy Sweeting X, NP as Nurse Practitioner (Nurse Practitioner) [name], MD as Consulting Physician (Orthopedic Surgery)  Extended Emergency Contact Information Primary Emergency Contact: [name], [place] Work Phone: [phone] Relation: Son Secondary Emergency Contact: [name] [place] Phone: [phone] Relation: Sister  Code Status:  DNR Goals of care: Advanced Directive information Advanced Directives 08/11/2021  Does Patient Have a Medical Advance Directive? Yes  Type of Paramedic of [place];Out of facility DNR (pink MOST or yellow form)  Does patient want to make changes to medical advance directive? No - Patient declined  Copy of [place] in Chart? Yes - validated most recent copy scanned in chart (See row information)  Pre-existing out of facility DNR order (yellow form or pink MOST form) Pink MOST/Yellow Form most recent copy in chart - Physician notified to receive inpatient order     Chief Complaint  Patient presents with   Medical Management of Chronic Issues    Routine visit   Quality Metric Gaps    Shingrix, COVID #5    HPI:  Pt is a 80 y.o. female seen today for medical management of chronic diseases.     HTN, on Furosemide, Spironolactone, ASA 69m qd,  Bun/creat 22/1.0 03/15/21             OA on Tylenol, Norco             GERD, stable, on Famotidine, Maalox qd/prn,  Hgb 12.2 03/15/21             Depression/anxiety, on Amitriptyline, Librium, Fluphenazine, Lorazepam, TSH 2.76 10/06/20             Chronic constipation, on prn Dulcolax po/suppository,  Linzess, prn MOM, MiraLax bid/prn             Allergic rhinitis, takes Claritin, Flonase, Mucinex        OP, stopped taking Prolia due to cost, ? Allergic to Fosamax.    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, 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   Vitamin D deficiency    Past Surgical History:  Procedure Laterality Date   ABDOMINAL HYSTERECTOMY     ANAL RECTAL MANOMETRY N/A 07/05/2014   Procedure: ANO RECTAL MANOMETRY;  Surgeon: ALeighton Ruff MD;  Location: [facility];  Service: Endoscopy;  Laterality: N/A;  APPENDECTOMY     CAROTID ENDARTERECTOMY     COLONOSCOPY  2008, 2015   TONSILLECTOMY     UPPER GASTROINTESTINAL ENDOSCOPY  2002    Allergies  Allergen Reactions   Biaxin [Clarithromycin]    Clarithromycin    Doxycycline    Fosamax [Alendronate Sodium]    Geodon [Ziprasidone Hydrochloride]    Lipitor [Atorvastatin Calcium]    Penicillins    Pravachol [Pravastatin Sodium]    Statins    Sulfonamide Derivatives    Zithromax [Azithromycin]    Zocor [Simvastatin]     Allergies as of 08/11/2021       Reactions   Biaxin [clarithromycin]    Clarithromycin    Doxycycline    Fosamax [alendronate Sodium]    Geodon [ziprasidone Hydrochloride]    Lipitor [atorvastatin Calcium]    Penicillins    Pravachol [pravastatin Sodium]    Statins    Sulfonamide Derivatives    Zithromax [azithromycin]     Zocor [simvastatin]         Medication List        Accurate as of August 11, 2021 11:59 PM. If you have any questions, ask your nurse or doctor.          Tylenol 325 MG Caps Generic drug: Acetaminophen Take 650 mg by mouth in the morning and at bedtime. AS NEEDED FOR PAIN *NOT TO EXCEED 3,000 MG IN 24HRS*   acetaminophen 325 MG tablet Commonly known as: TYLENOL Take 650 mg by mouth every 8 (eight) hours as needed. 8am & 8pm AS NEEDED FOR PAIN *NOT TO EXCEED 3,000 MG IN 24HRS*   alum & mag hydroxide-simeth 891-694-50 MG/5ML suspension Commonly known as: MAALOX/MYLANTA Take 30 mLs by mouth at bedtime. 9pm   alum & mag hydroxide-simeth 200-200-20 MG/5ML suspension Commonly known as: MAALOX/MYLANTA Take 30 mLs by mouth every 4 (four) hours as needed for indigestion or heartburn.   amitriptyline 50 MG tablet Commonly known as: ELAVIL Take 50 mg by mouth at bedtime. 9pm   ARTIFICIAL TEAR OP Apply 1 drop to eye 3 (three) times daily. Both Eyes at 8am, 2pm, and 9pm.   aspirin 81 MG EC tablet Take 81 mg by mouth daily. 9am   betamethasone valerate 0.1 % cream Commonly known as: VALISONE Apply 1 application topically as needed. Apply  thin amount to vulva twice daily.   Biofreeze 4 % Gel Generic drug: Menthol (Topical Analgesic) Apply topically in the morning, at noon, in the evening, and at bedtime. Apply to knee   bisacodyl 10 MG suppository Commonly known as: DULCOLAX Place 10 mg rectally daily as needed for moderate constipation.   bisacodyl 5 MG EC tablet Commonly known as: DULCOLAX Take 5 mg by mouth daily as needed for moderate constipation.   camphor-menthol lotion Commonly known as: SARNA Apply topically. 0.5-0.5 % Three times a day as needed to mid back   carbamide peroxide 6.5 % OTIC solution Commonly known as: DEBROX Place 5 drops to affected ear every night for 3 days, then irrigate with bulb syringe. If not effective, notify MD.   CENTRUM SILVER  PO Take 1 tablet by mouth. 8am   chlordiazePOXIDE 25 MG capsule Commonly known as: LIBRIUM Take one capsule by mouth at bedtime   famotidine 20 MG tablet Commonly known as: PEPCID Take 20 mg by mouth at bedtime. 8pm   fluPHENAZine 1 MG tablet Commonly known as: PROLIXIN Take 1 mg by mouth at bedtime. 8pm.   fluticasone 50 MCG/ACT nasal spray Commonly known  as: FLONASE Place 1 spray into both nostrils 2 (two) times daily. 8am & 8pm.   furosemide 20 MG tablet Commonly known as: LASIX Take 20 mg by mouth daily. 8am   guaiFENesin 600 MG 12 hr tablet Commonly known as: MUCINEX Take 600 mg by mouth 2 (two) times daily as needed. (1) tab, oral, As Needed, Administer med as needed for cold/congestion.   HYDROcodone-acetaminophen 5-325 MG tablet Commonly known as: Norco Take 1 tablet by mouth daily.   hydrocortisone 2.5 % cream Apply 1 application topically 2 (two) times daily as needed. Apply to affected areas on face.   hydrocortisone cream 1 % Apply 1 application topically 2 (two) times daily. May self apply twice daily to Hemorrhoids.   hydrocortisone valerate cream 0.2 % Commonly known as: WESTCORT Apply 1 application topically 2 (two) times daily as needed. Special Instructions: apply to brow with ketoconazole cream   ketoconazole 2 % cream Commonly known as: NIZORAL Apply 1 application topically 2 (two) times daily as needed for irritation. Special Instructions: apply to brow along with hydrocortisone cream   Ketoconazole-Hydrocortisone 2-2.5 % Crea Apply 1 application topically 2 (two) times daily as needed. Apply to face for rash one time daily as needed.   Linzess 290 MCG Caps capsule Generic drug: linaclotide Take 290 mcg by mouth daily. Take by mouth 15mnutes before breakfast on an empty stomach at 7:30am. Be sure to swallow whole   loratadine 10 MG tablet Commonly known as: CLARITIN Take 10 mg by mouth in the morning and at bedtime.   LORazepam 0.5 MG  tablet Commonly known as: ATIVAN Take 1 tablet (0.5 mg total) by mouth 2 (two) times daily.   magnesium hydroxide 400 MG/5ML suspension Commonly known as: MILK OF MAGNESIA Take 30 mLs by mouth daily as needed.   MIRALAX PO Take by mouth in the morning. Take  2 capful; Special Instructions: MIX WITH 8oz OF FLUIDS. ** RES. REQUESTED AM DOSAGE GIVEN AT 6:30AM**   polyethylene glycol 17 g packet Commonly known as: MIRALAX / GLYCOLAX Take 17 g by mouth in the morning and at bedtime.   mometasone 0.1 % cream Commonly known as: ELOCON May self administer to face twice daily as needed.   ORAL GEL ANESTHETIC MT Use as directed in the mouth or throat every 4 (four) hours as needed.   OYSTER SHELL PO Take 250 mg by mouth in the morning and at bedtime.   saccharomyces boulardii 250 MG capsule Commonly known as: FLORASTOR Take 250 mg by mouth. 8am   spironolactone 25 MG tablet Commonly known as: ALDACTONE Take 12.5 mg by mouth daily.   triamcinolone cream 0.5 % Commonly known as: KENALOG Apply 1 application topically 2 (two) times daily. Apply to back for itching in the morning and evening.        Review of Systems  Constitutional:  Positive for unexpected weight change. Negative for fatigue and fever.       Weight gained #8bs in 3 months.   HENT:  Positive for hearing loss. Negative for congestion, rhinorrhea and voice change.        Hoarseness  Eyes:  Positive for visual disturbance.       Low vision  Respiratory:  Negative for cough, shortness of breath and wheezing.   Cardiovascular:  Negative for leg swelling.  Gastrointestinal:  Negative for abdominal pain and constipation.  Genitourinary:  Negative for dysuria and frequency.  Musculoskeletal:  Positive for arthralgias and gait problem. Negative for back pain.  OA is under control  Skin:  Positive for rash. Negative for color change.       Left face chronic eczema  Neurological:  Negative for dizziness, speech  difficulty, weakness and headaches.       Memory lapses  Psychiatric/Behavioral:  Negative for behavioral problems and sleep disturbance. The patient is not nervous/anxious.    Immunization History  Administered Date(s) Administered   Influenza Split 07/23/2013   Influenza Whole 07/05/2009, 06/26/2018   Influenza-Unspecified 07/29/2014, 06/14/2015, 07/11/2016, 07/15/2017, 06/27/2019, 07/05/2020, 02/21/2021, 07/13/2021   Moderna Sars-Covid-2 Vaccination 09/26/2019, 10/24/2019, 08/02/2020   PFIZER(Purple Top)SARS-COV-2 Vaccination 09/26/2019   PPD Test 06/17/2013   Pneumococcal Conjugate-13 07/22/2017   Pneumococcal Polysaccharide-23 05/05/2009   Td 05/05/2009, 07/16/2019   Unspecified SARS-COV-2 Vaccination 06/13/2021   Zoster, Live 01/09/2011   Pertinent  Health Maintenance Due  Topic Date Due   INFLUENZA VACCINE  Completed   DEXA SCAN  Completed   Fall Risk 07/29/2014 06/04/2017 06/10/2018 01/05/2020 03/10/2021  Falls in the past year? No Yes No - -  Was there an injury with Fall? - No - - -  Patient Fall Risk Level - - - High fall risk Moderate fall risk   Functional Status Survey:    Vitals:   08/11/21 1034  BP: 120/72  Pulse: 80  Resp: 18  Temp: 97.8 F (36.6 C)  SpO2: 96%  Weight: 143 lb 12.8 oz (65.2 kg)  Height: 5' 6"  (1.676 m)   Body mass index is 23.21 kg/m. Physical Exam Constitutional:      Appearance: Normal appearance.  HENT:     Head: Normocephalic.     Comments: No erythema or drainage in thorat    Nose: Nose normal.     Mouth/Throat:     Mouth: Mucous membranes are moist.  Eyes:     Extraocular Movements: Extraocular movements intact.     Conjunctiva/sclera: Conjunctivae normal.     Pupils: Pupils are equal, round, and reactive to light.     Comments: Legally blind  Cardiovascular:     Rate and Rhythm: Normal rate and regular rhythm.     Heart sounds: No murmur heard. Pulmonary:     Effort: Pulmonary effort is normal.     Breath sounds: No  rales.  Abdominal:     General: Bowel sounds are normal.     Palpations: Abdomen is soft.     Tenderness: There is no abdominal tenderness.  Musculoskeletal:     Cervical back: Normal range of motion and neck supple.     Right lower leg: No edema.     Left lower leg: No edema.  Skin:    General: Skin is warm and dry.     Findings: Erythema present. No bruising.     Comments: Left face chronic eczema.   Neurological:     General: No focal deficit present.     Mental Status: She is alert. Mental status is at baseline.     Gait: Gait abnormal.     Comments: Oriented to person, place.   Psychiatric:        Mood and Affect: Mood normal.        Behavior: Behavior normal.        Thought Content: Thought content normal.    Labs reviewed: Recent Labs    10/06/20 0000 03/15/21 0000  NA 141 140  K 4.4 4.4  CL 105 104  CO2 25* 24*  BUN 17 22*  CREATININE 1.0 1.0  CALCIUM 9.4 9.7  Recent Labs    10/06/20 0000 03/15/21 0000  AST 23 25  ALT 15 19  ALKPHOS 94 92  ALBUMIN 4.1 4.3   Recent Labs    10/06/20 0000 03/15/21 0000  WBC 7.4 5.5  NEUTROABS 3,419.00 2,877.00  HGB 12.7 12.2  HCT 37 36  PLT 300 335   Lab Results  Component Value Date   TSH 2.76 10/06/2020   Lab Results  Component Value Date   HGBA1C 5.9 02/05/2017   Lab Results  Component Value Date   CHOL 247 (A) 11/21/2018   HDL 72 (A) 11/21/2018   LDLCALC 147 11/21/2018   LDLDIRECT 190.0 03/24/2012   TRIG 150 11/21/2018   CHOLHDL 4 03/24/2012    Significant Diagnostic Results in last 30 days:  No results found.  Assessment/Plan Weight gain About #8Ibs in the past 3 months, no apparent fluid retention, will update CBC/diff, CMP/eGFR, TSH. Dietary f/u.   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 Allergic to Fosamax.    Dermatitis Left face chronic, recurrent eczema like redness, f/u Dermatology.     Family/ staff Communication: plan of care reviewed with the patient and charge nurse.   Labs/tests ordered:  CBC/diff, CMP/eGFR, TSH  Time spend 40 minutes.

## 2021-08-14 ENCOUNTER — Encounter: Payer: Self-pay | Admitting: Nurse Practitioner

## 2021-08-14 DIAGNOSIS — L309 Dermatitis, unspecified: Secondary | ICD-10-CM | POA: Insufficient documentation

## 2021-08-14 DIAGNOSIS — R635 Abnormal weight gain: Secondary | ICD-10-CM | POA: Insufficient documentation

## 2021-08-14 NOTE — Assessment & Plan Note (Addendum)
Stable,  on Amitriptyline, Librium, Fluphenazine, Lorazepam, TSH 2.76 10/06/20

## 2021-08-14 NOTE — Assessment & Plan Note (Signed)
takes Claritin, Flonase, Mucinex       

## 2021-08-14 NOTE — Assessment & Plan Note (Signed)
Left face chronic, recurrent eczema like redness, f/u Dermatology.

## 2021-08-14 NOTE — Assessment & Plan Note (Signed)
stable, on Famotidine, Maalox qd/prn,  Hgb 12.2 03/15/21

## 2021-08-14 NOTE — Assessment & Plan Note (Signed)
Blood pressure is controlled,  on Furosemide, Spironolactone, ASA 81mg  qd,  Bun/creat 22/1.0 03/15/21

## 2021-08-14 NOTE — Assessment & Plan Note (Signed)
Stable,  on prn Dulcolax po/suppository, Linzess, prn MOM, MiraLax bid/prn

## 2021-08-14 NOTE — Assessment & Plan Note (Signed)
on Tylenol, Norco

## 2021-08-14 NOTE — Assessment & Plan Note (Signed)
stopped taking Prolia due to cost, ? Allergic to Fosamax.

## 2021-08-14 NOTE — Assessment & Plan Note (Signed)
About #8Ibs in the past 3 months, no apparent fluid retention, will update CBC/diff, CMP/eGFR, TSH. Dietary f/u.

## 2021-08-15 ENCOUNTER — Encounter: Payer: Self-pay | Admitting: Nurse Practitioner

## 2021-08-15 LAB — CBC AND DIFFERENTIAL
HCT: 36 (ref 36–46)
Hemoglobin: 12.2 (ref 12.0–16.0)
Neutrophils Absolute: 4843
Platelets: 362 (ref 150–399)
WBC: 7.7

## 2021-08-15 LAB — COMPREHENSIVE METABOLIC PANEL
Albumin: 4.1 (ref 3.5–5.0)
Calcium: 9.6 (ref 8.7–10.7)
Globulin: 2.8

## 2021-08-15 LAB — BASIC METABOLIC PANEL
BUN: 19 (ref 4–21)
CO2: 27 — AB (ref 13–22)
Chloride: 101 (ref 99–108)
Creatinine: 0.9 (ref 0.5–1.1)
Glucose: 88
Potassium: 4.5 (ref 3.4–5.3)
Sodium: 139 (ref 137–147)

## 2021-08-15 LAB — HEPATIC FUNCTION PANEL
ALT: 18 (ref 7–35)
AST: 25 (ref 13–35)
Alkaline Phosphatase: 146 — AB (ref 25–125)

## 2021-08-15 LAB — TSH: TSH: 1.62 (ref 0.41–5.90)

## 2021-08-15 LAB — CBC: RBC: 4.01 (ref 3.87–5.11)

## 2021-08-30 ENCOUNTER — Other Ambulatory Visit: Payer: Self-pay | Admitting: Orthopedic Surgery

## 2021-08-30 DIAGNOSIS — M159 Polyosteoarthritis, unspecified: Secondary | ICD-10-CM

## 2021-08-30 MED ORDER — HYDROCODONE-ACETAMINOPHEN 5-325 MG PO TABS: 1.0000 | ORAL_TABLET | Freq: Every day | ORAL | 0 refills | Status: DC

## 2021-09-06 ENCOUNTER — Other Ambulatory Visit: Payer: Self-pay | Admitting: Orthopedic Surgery

## 2021-09-06 DIAGNOSIS — F419 Anxiety disorder, unspecified: Secondary | ICD-10-CM

## 2021-09-06 MED ORDER — CHLORDIAZEPOXIDE HCL 25 MG PO CAPS
ORAL_CAPSULE | ORAL | 0 refills | Status: DC
Start: 2021-09-06 — End: 2021-10-11

## 2021-09-06 MED ORDER — LORAZEPAM 0.5 MG PO TABS: 0.5000 mg | ORAL_TABLET | Freq: Two times a day (BID) | ORAL | 0 refills | Status: DC

## 2021-09-07 ENCOUNTER — Non-Acute Institutional Stay: Payer: Medicare Other | Admitting: Nurse Practitioner

## 2021-09-07 ENCOUNTER — Encounter: Payer: Self-pay | Admitting: Nurse Practitioner

## 2021-09-07 DIAGNOSIS — M79605 Pain in left leg: Secondary | ICD-10-CM | POA: Diagnosis not present

## 2021-09-07 DIAGNOSIS — M159 Polyosteoarthritis, unspecified: Secondary | ICD-10-CM

## 2021-09-07 DIAGNOSIS — K219 Gastro-esophageal reflux disease without esophagitis: Secondary | ICD-10-CM

## 2021-09-07 DIAGNOSIS — M8000XA Age-related osteoporosis with current pathological fracture, unspecified site, initial encounter for fracture: Secondary | ICD-10-CM

## 2021-09-07 DIAGNOSIS — K5909 Other constipation: Secondary | ICD-10-CM

## 2021-09-07 DIAGNOSIS — I10 Essential (primary) hypertension: Secondary | ICD-10-CM

## 2021-09-07 DIAGNOSIS — M7989 Other specified soft tissue disorders: Secondary | ICD-10-CM

## 2021-09-07 DIAGNOSIS — F418 Other specified anxiety disorders: Secondary | ICD-10-CM

## 2021-09-07 DIAGNOSIS — J309 Allergic rhinitis, unspecified: Secondary | ICD-10-CM

## 2021-09-07 LAB — CBC AND DIFFERENTIAL
HCT: 38 (ref 36–46)
Hemoglobin: 12.4 (ref 12.0–16.0)
Neutrophils Absolute: 3798
Platelets: 263 (ref 150–399)
WBC: 4.9

## 2021-09-07 LAB — COMPREHENSIVE METABOLIC PANEL
Albumin: 7.2 — AB (ref 3.5–5.0)
Calcium: 9.1 (ref 8.7–10.7)
Globulin: 3

## 2021-09-07 LAB — HEPATIC FUNCTION PANEL
ALT: 18 (ref 7–35)
AST: 27 (ref 13–35)
Alkaline Phosphatase: 121 (ref 25–125)
Bilirubin, Total: 0.4

## 2021-09-07 LAB — BASIC METABOLIC PANEL
BUN: 18 (ref 4–21)
CO2: 23 — AB (ref 13–22)
Chloride: 104 (ref 99–108)
Creatinine: 1.1 (ref 0.5–1.1)
Glucose: 81
Potassium: 4.3 (ref 3.4–5.3)
Sodium: 137 (ref 137–147)

## 2021-09-07 LAB — CBC: RBC: 4.08 (ref 3.87–5.11)

## 2021-09-07 NOTE — Assessment & Plan Note (Signed)
takes Claritin, Flonase, Mucinex       

## 2021-09-07 NOTE — Assessment & Plan Note (Signed)
on Tylenol, Norco

## 2021-09-07 NOTE — Assessment & Plan Note (Addendum)
on Furosemide, Spironolactone, ASA 81mg  qd

## 2021-09-07 NOTE — Assessment & Plan Note (Addendum)
stable, on Famotidine, Maalox qd/prn

## 2021-09-07 NOTE — Assessment & Plan Note (Signed)
on prn Dulcolax po/suppository, Linzess, prn MOM, MiraLax bid/p

## 2021-09-07 NOTE — Assessment & Plan Note (Signed)
stopped taking Prolia due to cost, ? Allergic to Fosamax.

## 2021-09-07 NOTE — Assessment & Plan Note (Signed)
on Amitriptyline, Librium, Fluphenazine, Lorazepam, TSH 2.76 10/06/20

## 2021-09-07 NOTE — Progress Notes (Signed)
Location:   [facility] Room Number: 004 A Place of Service:  [facility] (13) Provider: Lennie Odor Jedi Catalfamo NP  [name], MD  Patient Care Team: [name], MD as PCP - General (Internal Medicine) [name], MD (Ophthalmology) [name], MD (Psychiatry) Murphy Duzan X, NP as Nurse Practitioner (Nurse Practitioner) [name], MD as Consulting Physician (Orthopedic Surgery)  Extended Emergency Contact Information Primary Emergency Contact: [name], [place] Work Phone: [phone] Relation: Son Secondary Emergency Contact: [name] [place] Phone: [phone] Relation: Sister  Code Status: DNR Goals of care: Advanced Directive information Advanced Directives 09/07/2021  Does Patient Have a Medical Advance Directive? Yes  Type of Paramedic of [place];Out of facility DNR (pink MOST or yellow form)  Does patient want to make changes to medical advance directive? No - Patient declined  Copy of [place] in Chart? Yes - validated most recent copy scanned in chart (See row information)  Pre-existing out of facility DNR order (yellow form or pink MOST form) Pink MOST/Yellow Form most recent copy in chart - Physician notified to receive inpatient order     Chief Complaint  Patient presents with   Acute Visit    Acute visit for Left Foot pain     HPI:  Pt is a 80 y.o. female seen today for an acute visit for c/o Left lower leg, ankle, foot pain, mostly in ankle and calf, worsened when palpated and left foot dorsiflexion, swelling, warmth. The patient denied fall or injury, able to walk but c/o pain.     HTN, on Furosemide, Spironolactone, ASA 61m qd             OA on Tylenol, Norco             GERD, stable, on Famotidine, Maalox qd/prn             Depression/anxiety, on Amitriptyline, Librium, Fluphenazine, Lorazepam, TSH 2.76 10/06/20              Chronic constipation, on prn Dulcolax po/suppository, Linzess, prn MOM, MiraLax bid/prn             Allergic rhinitis, takes Claritin, Flonase, Mucinex        OP, stopped taking Prolia due to cost, ? Allergic to Fosamax.    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, 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   Vitamin D deficiency    Past Surgical History:  Procedure Laterality Date   ABDOMINAL HYSTERECTOMY     ANAL RECTAL MANOMETRY N/A 07/05/2014   Procedure: ANO RECTAL MANOMETRY;  Surgeon: ALeighton Ruff MD;  Location: [facility];  Service: Endoscopy;  Laterality: N/A;   APPENDECTOMY     CAROTID ENDARTERECTOMY     COLONOSCOPY  2008, 2015   TONSILLECTOMY     UPPER GASTROINTESTINAL ENDOSCOPY  2002    Allergies  Allergen Reactions   Biaxin [Clarithromycin]    Clarithromycin    Doxycycline    Fosamax [Alendronate Sodium]    Geodon [Ziprasidone Hydrochloride]    Lipitor [Atorvastatin Calcium]    Penicillins    Pravachol [Pravastatin Sodium]    Statins    Sulfonamide Derivatives    Zithromax [Azithromycin]    Zocor [Simvastatin]     Allergies as of 09/07/2021       Reactions   Biaxin [clarithromycin]    Clarithromycin    Doxycycline    Fosamax [alendronate Sodium]    Geodon [ziprasidone Hydrochloride]    Lipitor [atorvastatin Calcium]    Penicillins    Pravachol [pravastatin Sodium]    Statins     Sulfonamide Derivatives    Zithromax [azithromycin]    Zocor [simvastatin]         Medication List        Accurate as of September 07, 2021 11:59 PM. If you have any questions, ask your nurse or doctor.          Tylenol 325 MG Caps Generic drug: Acetaminophen Take 650 mg by mouth in the morning and at bedtime. AS NEEDED FOR PAIN *NOT TO EXCEED 3,000 MG IN 24HRS*   acetaminophen 325 MG tablet Commonly known as: TYLENOL Take 650 mg by mouth every 8 (eight) hours as needed. 8am & 8pm AS NEEDED FOR PAIN *NOT TO EXCEED 3,000 MG IN 24HRS*   alum & mag hydroxide-simeth 338-329-19 MG/5ML suspension Commonly known as: MAALOX/MYLANTA Take 30 mLs by mouth at bedtime. 9pm   alum & mag hydroxide-simeth 200-200-20 MG/5ML suspension Commonly known as: MAALOX/MYLANTA Take 30 mLs by mouth every 4 (four) hours as needed for indigestion or heartburn.   amitriptyline 50 MG tablet Commonly known as: ELAVIL Take 50 mg by mouth at bedtime. 9pm   ARTIFICIAL TEAR OP Apply 1 drop to eye 3 (three) times daily. Both Eyes at 8am, 2pm, and 9pm.   aspirin 81 MG EC tablet Take 81 mg by mouth daily. 9am   betamethasone valerate 0.1 % cream Commonly known as: VALISONE Apply 1 application topically as needed. Apply  thin amount to vulva twice daily.   Biofreeze 4 % Gel Generic drug: Menthol (Topical Analgesic) Apply topically in the morning, at noon, in the evening, and at bedtime. Apply to knee   bisacodyl 10 MG suppository Commonly known as: DULCOLAX Place 10 mg rectally daily as needed for moderate constipation.   bisacodyl 5 MG EC tablet Commonly known as: DULCOLAX Take 5 mg by mouth daily as needed for moderate constipation.   camphor-menthol lotion Commonly known as: SARNA Apply topically. 0.5-0.5 % Three times a day as needed to mid back   carbamide peroxide 6.5 % OTIC solution Commonly known as: DEBROX Place 5 drops to affected ear every night for 3 days, then irrigate with bulb  syringe. If not effective, notify MD.   CENTRUM SILVER PO Take 1 tablet by mouth. 8am   chlordiazePOXIDE 25 MG capsule Commonly known as: LIBRIUM Take one capsule by mouth at bedtime   famotidine 20 MG tablet Commonly known as: PEPCID Take 20 mg by mouth at bedtime. 8pm   fluPHENAZine 1 MG tablet Commonly known as: PROLIXIN Take 1 mg by mouth at  bedtime. 8pm.   fluticasone 50 MCG/ACT nasal spray Commonly known as: FLONASE Place 1 spray into both nostrils 2 (two) times daily. 8am & 8pm.   furosemide 20 MG tablet Commonly known as: LASIX Take 20 mg by mouth daily. 8am   guaiFENesin 600 MG 12 hr tablet Commonly known as: MUCINEX Take 600 mg by mouth 2 (two) times daily as needed. (1) tab, oral, As Needed, Administer med as needed for cold/congestion.   HYDROcodone-acetaminophen 5-325 MG tablet Commonly known as: Norco Take 1 tablet by mouth daily.   hydrocortisone 2.5 % cream Apply 1 application topically 2 (two) times daily as needed. Apply to affected areas on face.   hydrocortisone cream 1 % Apply 1 application topically 2 (two) times daily. May self apply twice daily to Hemorrhoids.   hydrocortisone valerate cream 0.2 % Commonly known as: WESTCORT Apply 1 application topically 2 (two) times daily as needed. Special Instructions: apply to brow with ketoconazole cream   ketoconazole 2 % cream Commonly known as: NIZORAL Apply 1 application topically 2 (two) times daily as needed for irritation. Special Instructions: apply to brow along with hydrocortisone cream   Ketoconazole-Hydrocortisone 2-2.5 % Crea Apply 1 application topically 2 (two) times daily as needed. Apply to face for rash one time daily as needed.   Linzess 290 MCG Caps capsule Generic drug: linaclotide Take 290 mcg by mouth daily. Take by mouth 82mnutes before breakfast on an empty stomach at 7:30am. Be sure to swallow whole   loratadine 10 MG tablet Commonly known as: CLARITIN Take 10 mg by mouth  in the morning and at bedtime.   LORazepam 0.5 MG tablet Commonly known as: ATIVAN Take 1 tablet (0.5 mg total) by mouth 2 (two) times daily.   magnesium hydroxide 400 MG/5ML suspension Commonly known as: MILK OF MAGNESIA Take 30 mLs by mouth daily as needed.   MIRALAX PO Take by mouth in the morning. Take  2 capful; Special Instructions: MIX WITH 8oz OF FLUIDS. ** RES. REQUESTED AM DOSAGE GIVEN AT 6:30AM**   polyethylene glycol 17 g packet Commonly known as: MIRALAX / GLYCOLAX Take 17 g by mouth in the morning and at bedtime. Bedtime and Once a day-PRN   mometasone 0.1 % cream Commonly known as: ELOCON May self administer to face twice daily as needed.   naproxen 500 MG tablet Commonly known as: NAPROSYN Take 500 mg by mouth 2 (two) times daily with a meal.   ORAL GEL ANESTHETIC MT Use as directed in the mouth or throat every 4 (four) hours as needed.   OYSTER SHELL PO Take 250 mg by mouth in the morning and at bedtime.   saccharomyces boulardii 250 MG capsule Commonly known as: FLORASTOR Take 250 mg by mouth. 8am   spironolactone 25 MG tablet Commonly known as: ALDACTONE Take 12.5 mg by mouth daily.   triamcinolone cream 0.5 % Commonly known as: KENALOG Apply 1 application topically 2 (two) times daily. Apply to back for itching in the morning and evening.   Voltaren 1 % Gel Generic drug: diclofenac Sodium Apply 2 g topically 2 (two) times daily as needed. apply to lower back area        Review of Systems  Constitutional:  Negative for appetite change, fatigue and fever.  HENT:  Positive for hearing loss. Negative for congestion, rhinorrhea and voice change.        Hoarseness  Eyes:  Positive for visual disturbance.       Low vision  Respiratory:  Negative for cough, shortness of breath and wheezing.   Cardiovascular:  Negative for leg swelling.  Gastrointestinal:  Negative for abdominal pain and constipation.  Genitourinary:  Negative for dysuria and  frequency.  Musculoskeletal:  Positive for arthralgias and gait problem. Negative for back pain.       Left lower leg pain, warmth, swelling  Skin:  Negative for color change.  Neurological:  Negative for speech difficulty, weakness and headaches.       Memory lapses  Psychiatric/Behavioral:  Negative for behavioral problems and sleep disturbance. The patient is not nervous/anxious.    Immunization History  Administered Date(s) Administered   Influenza Split 07/23/2013   Influenza Whole 07/05/2009, 06/26/2018   Influenza-Unspecified 07/29/2014, 06/14/2015, 07/11/2016, 07/15/2017, 06/27/2019, 07/05/2020, 07/13/2021   Moderna Sars-Covid-2 Vaccination 09/26/2019, 10/24/2019, 08/02/2020   PFIZER(Purple Top)SARS-COV-2 Vaccination 09/26/2019   PPD Test 06/17/2013   Pneumococcal Conjugate-13 07/22/2017   Pneumococcal Polysaccharide-23 05/05/2009   Td 05/05/2009, 07/16/2019   Unspecified SARS-COV-2 Vaccination 02/21/2021, 06/13/2021   Zoster, Live 01/09/2011   Pertinent  Health Maintenance Due  Topic Date Due   INFLUENZA VACCINE  Completed   DEXA SCAN  Completed   Fall Risk 07/29/2014 06/04/2017 06/10/2018 01/05/2020 03/10/2021  Falls in the past year? No Yes No - -  Was there an injury with Fall? - No - - -  Patient Fall Risk Level - - - High fall risk Moderate fall risk   Functional Status Survey:    Vitals:   09/07/21 1305  BP: 134/78  Pulse: 70  Resp: 18  Temp: 98.2 F (36.8 C)  SpO2: 94%  Weight: 145 lb 12.8 oz (66.1 kg)  Height: _0  (1.676 m)   Body mass index is 23.53 kg/m. Physical Exam Constitutional:      Appearance: Normal appearance.  HENT:     Head: Normocephalic.     Comments: No erythema or drainage in thorat    Nose: Nose normal.     Mouth/Throat:     Mouth: Mucous membranes are moist.  Eyes:     Extraocular Movements: Extraocular movements intact.     Conjunctiva/sclera: Conjunctivae normal.     Pupils: Pupils are equal, round, and reactive to light.      Comments: Legally blind  Cardiovascular:     Rate and Rhythm: Normal rate and regular rhythm.     Heart sounds: No murmur heard.    Comments: DP pulses R+L present.  Pulmonary:     Effort: Pulmonary effort is normal.     Breath sounds: No rales.  Abdominal:     General: Bowel sounds are normal.     Palpations: Abdomen is soft.     Tenderness: There is no abdominal tenderness.  Musculoskeletal:        General: Swelling and tenderness present.     Cervical back: Normal range of motion and neck supple.     Right lower leg: No edema.     Left lower leg: Edema present.     Comments: Left lower leg/foot swelling, pain-mostly in ankle and calf-worsens with palpation and left foot dorsiflexion, warmth noted, no bruise or sign of injury, able to walk with pain. DP R+L present.   Skin:    General: Skin is warm and dry.     Findings: Erythema present. No bruising.     Comments: Left face chronic eczema.   Neurological:     General: No focal deficit present.     Mental Status: She is alert. Mental status is  at baseline.     Gait: Gait abnormal.     Comments: Oriented to person, place.   Psychiatric:        Mood and Affect: Mood normal.        Behavior: Behavior normal.        Thought Content: Thought content normal.    Labs reviewed: Recent Labs    10/06/20 0000 03/15/21 0000 08/15/21 0000  NA 141 140 139  K 4.4 4.4 4.5  CL 105 104 101  CO2 25* 24* 27*  BUN 17 22* 19  CREATININE 1.0 1.0 0.9  CALCIUM 9.4 9.7 9.6   Recent Labs    10/06/20 0000 03/15/21 0000 08/15/21 0000  AST _0 ALT _1 ALKPHOS 94 92 146*  ALBUMIN 4.1 4.3 4.1   Recent Labs    10/06/20 0000 03/15/21 0000 08/15/21 0000  WBC 7.4 5.5 7.7  NEUTROABS 3,419.00 2,877.00 4,843.00  HGB 12.7 12.2 12.2  HCT 37 36 36  PLT 300 335 362   Lab Results  Component Value Date   TSH 1.62 08/15/2021   Lab Results  Component Value Date   HGBA1C 5.9 02/05/2017   Lab Results  Component Value  Date   CHOL 247 (A) 11/21/2018   HDL 72 (A) 11/21/2018   LDLCALC 147 11/21/2018   LDLDIRECT 190.0 03/24/2012   TRIG 150 11/21/2018   CHOLHDL 4 03/24/2012    Significant Diagnostic Results in last 30 days:  No results found.  Assessment/Plan: Pain and swelling of left lower extremity Left lower leg, ankle, foot pain, mostly in ankle and calf, worsened when palpated and left foot dorsiflexion, swelling, warmth noted, DP pulse present, will venous US LLE, X-ray L tibia, fibula, ankle, foot, uric acid, CBC/diff, CMP/eGFR, ESR, CRP. Will starte Naproxen 545m bid with food x 7 days.   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 Allergic to Fosamax.      Family/ staff Communication: plan of care reviewed with the patient and charge nurse.   Labs/tests ordered:  venous [place], X-ray L tibia, fibula, ankle, foot, uric acid, CBC/diff, CMP/eGFR, ESR, CRP  Time spend 40 minutes.

## 2021-09-07 NOTE — Assessment & Plan Note (Signed)
Left lower leg, ankle, foot pain, mostly in ankle and calf, worsened when palpated and left foot dorsiflexion, swelling, warmth noted, DP pulse present, will venous US LLE, X-ray L tibia, fibula, ankle, foot, uric acid, CBC/diff, CMP/eGFR, ESR, CRP. Will starte Naproxen 555m bid with food x 7 days.

## 2021-09-08 ENCOUNTER — Encounter: Payer: Self-pay | Admitting: Nurse Practitioner

## 2021-09-08 ENCOUNTER — Non-Acute Institutional Stay: Payer: Medicare Other | Admitting: Nurse Practitioner

## 2021-09-08 DIAGNOSIS — M159 Polyosteoarthritis, unspecified: Secondary | ICD-10-CM | POA: Diagnosis not present

## 2021-09-08 DIAGNOSIS — J111 Influenza due to unidentified influenza virus with other respiratory manifestations: Secondary | ICD-10-CM | POA: Diagnosis not present

## 2021-09-08 DIAGNOSIS — K5909 Other constipation: Secondary | ICD-10-CM

## 2021-09-08 DIAGNOSIS — K219 Gastro-esophageal reflux disease without esophagitis: Secondary | ICD-10-CM

## 2021-09-08 DIAGNOSIS — I10 Essential (primary) hypertension: Secondary | ICD-10-CM | POA: Diagnosis not present

## 2021-09-08 DIAGNOSIS — F418 Other specified anxiety disorders: Secondary | ICD-10-CM

## 2021-09-08 DIAGNOSIS — J309 Allergic rhinitis, unspecified: Secondary | ICD-10-CM

## 2021-09-08 NOTE — Assessment & Plan Note (Signed)
Stable,  on prn Dulcolax po/suppository, Linzess, prn MOM, MiraLax bid/prn

## 2021-09-08 NOTE — Assessment & Plan Note (Signed)
Blood pressure is controlled, on Furosemide, Spironolactone, ASA 81mg  qd

## 2021-09-08 NOTE — Assessment & Plan Note (Signed)
Her mood is stable, on Amitriptyline, Librium, Fluphenazine, Lorazepam, TSH 2.76 10/06/20

## 2021-09-08 NOTE — Progress Notes (Signed)
Location:   [facility] [place] Room Number: 161 A Place of Service:  [facility] (13) Provider: Lennie Odor Sharmarke Cicio NP  [name], MD  Patient Care Team: [name], MD as PCP - General (Internal Medicine) [name], MD (Ophthalmology) [name], MD (Psychiatry) Ahnaf Caponi X, NP as Nurse Practitioner (Nurse Practitioner) [name], MD as Consulting Physician (Orthopedic Surgery)  Extended Emergency Contact Information Primary Emergency Contact: [name], [place] Work Phone: [phone] Relation: Son Secondary Emergency Contact: [name] [place] Phone: [phone] Relation: Sister  Code Status: DNR Goals of care: Advanced Directive information Advanced Directives 09/08/2021  Does Patient Have a Medical Advance Directive? Yes  Type of Paramedic of [place];Out of facility DNR (pink MOST or yellow form)  Does patient want to make changes to medical advance directive? No - Patient declined  Copy of [place] in Chart? Yes - validated most recent copy scanned in chart (See row information)  Pre-existing out of facility DNR order (yellow form or pink MOST form) Pink MOST/Yellow Form most recent copy in chart - Physician notified to receive inpatient order     Chief Complaint  Patient presents with   Acute Visit    Acute visit for Fever and Cough.    HPI:  Pt is a 80 y.o. female seen today for an acute visit for congestion, cough, fever, pending CXR, tested positive Flu. The patient was noted generalized weakness, malaise, cough. Oral intake for breakfast remains no change.    Left lower leg, ankle, foot pain, mostly in ankle and calf, swelling, warmth, improving, started Naproxen 500mg  bid x 7 days 09/07/21. 09/07/21 uric acid 6.4, wbc 4.9, Hgb 12.4, plt 263, neutrophils 49, CRP 4.8, Na 137, K 4.3, Bun 18, creat 1.05, X-ray L tibia/fibula, ankle,  foot, no acute abnormality, noted heel spur. Pending venous US             HTN, on Furosemide, Spironolactone, ASA 81mg  qd             OA on Tylenol, Norco             GERD, stable, on Famotidine, Maalox qd/prn             Depression/anxiety, on Amitriptyline, Librium, Fluphenazine, Lorazepam, TSH 2.76 10/06/20             Chronic constipation, on prn Dulcolax po/suppository, Linzess, prn MOM, MiraLax bid/prn             Allergic rhinitis, takes Claritin, Flonase, Mucinex        OP, stopped taking Prolia due to cost, ? Allergic to Fosamax.   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, 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   Vitamin D deficiency    Past Surgical History:  Procedure Laterality Date   ABDOMINAL HYSTERECTOMY     ANAL RECTAL MANOMETRY N/A 07/05/2014   Procedure: ANO RECTAL MANOMETRY;  Surgeon: Leighton Ruff, MD;  Location: [facility];  Service: Endoscopy;  Laterality: N/A;   APPENDECTOMY     CAROTID ENDARTERECTOMY     COLONOSCOPY  2008, 2015   TONSILLECTOMY     UPPER GASTROINTESTINAL ENDOSCOPY  2002    Allergies  Allergen Reactions   Biaxin [Clarithromycin]    Clarithromycin    Doxycycline    Fosamax [Alendronate Sodium]    Geodon [Ziprasidone Hydrochloride]    Lipitor [Atorvastatin Calcium]    Penicillins    Pravachol [Pravastatin Sodium]    Statins    Sulfonamide Derivatives    Zithromax  [Azithromycin]    Zocor [Simvastatin]     Allergies as of 09/08/2021       Reactions   Biaxin [clarithromycin]    Clarithromycin    Doxycycline    Fosamax [alendronate Sodium]    Geodon [ziprasidone Hydrochloride]    Lipitor [atorvastatin Calcium]    Penicillins    Pravachol [pravastatin Sodium]    Statins    Sulfonamide Derivatives    Zithromax [azithromycin]    Zocor [simvastatin]         Medication List        Accurate as of September 08, 2021 11:59 PM. If you have any questions, ask your nurse or doctor.          Tylenol 325 MG Caps Generic drug: Acetaminophen Take 650 mg by mouth in the morning and at bedtime. AS NEEDED FOR PAIN *NOT TO EXCEED 3,000 MG IN 24HRS*   acetaminophen 325 MG tablet Commonly known as: TYLENOL Take 650 mg by mouth every 8 (eight) hours as needed. 8am & 8pm AS NEEDED FOR PAIN *NOT TO EXCEED 3,000 MG IN 24HRS*   alum & mag hydroxide-simeth 676-195-09 MG/5ML suspension Commonly known as: MAALOX/MYLANTA Take 30 mLs by mouth at bedtime. 9pm   alum & mag hydroxide-simeth 200-200-20 MG/5ML suspension Commonly known as: MAALOX/MYLANTA Take 30 mLs by mouth every 4 (four) hours as needed for indigestion or heartburn.   amitriptyline 50 MG tablet Commonly known as: ELAVIL Take 50 mg by mouth at bedtime. 9pm   ARTIFICIAL TEAR OP Apply 1 drop to eye 3 (three) times daily. Both Eyes at 8am, 2pm, and 9pm.   aspirin 81 MG EC tablet Take 81 mg by mouth daily. 9am   betamethasone valerate 0.1 % cream Commonly known as: VALISONE Apply 1 application topically as needed. Apply  thin amount to vulva twice daily.   Biofreeze 4 % Gel Generic drug: Menthol (Topical Analgesic) Apply topically in the morning, at noon, in the evening, and at bedtime. Apply to knee   bisacodyl 10 MG suppository Commonly known as: DULCOLAX Place 10 mg rectally daily as needed for moderate constipation.   bisacodyl 5 MG EC tablet Commonly known as: DULCOLAX Take  5 mg by mouth daily as needed for moderate constipation.   camphor-menthol lotion Commonly known as: SARNA Apply topically. 0.5-0.5 % Three times a day as needed to mid back   carbamide peroxide 6.5 % OTIC solution Commonly known as: DEBROX Place 5 drops to affected ear every night for 3 days, then irrigate with bulb syringe. If not effective, notify MD.   CENTRUM SILVER PO  Take 1 tablet by mouth. 8am   chlordiazePOXIDE 25 MG capsule Commonly known as: LIBRIUM Take one capsule by mouth at bedtime   diclofenac Sodium 1 % Gel Commonly known as: VOLTAREN Apply 2 g topically 2 (two) times daily as needed. apply to lower back area   famotidine 20 MG tablet Commonly known as: PEPCID Take 20 mg by mouth at bedtime. 8pm   fluPHENAZine 1 MG tablet Commonly known as: PROLIXIN Take 1 mg by mouth at bedtime. 8pm.   fluticasone 50 MCG/ACT nasal spray Commonly known as: FLONASE Place 1 spray into both nostrils 2 (two) times daily. 8am & 8pm.   furosemide 20 MG tablet Commonly known as: LASIX Take 20 mg by mouth daily. 8am   guaiFENesin 600 MG 12 hr tablet Commonly known as: MUCINEX Take 600 mg by mouth 2 (two) times daily as needed. (1) tab, oral, As Needed, Administer med as needed for cold/congestion.   HYDROcodone-acetaminophen 5-325 MG tablet Commonly known as: Norco Take 1 tablet by mouth daily.   hydrocortisone 2.5 % cream Apply 1 application topically 2 (two) times daily as needed. Apply to affected areas on face.   hydrocortisone cream 1 % Apply 1 application topically 2 (two) times daily. May self apply twice daily to Hemorrhoids.   hydrocortisone valerate cream 0.2 % Commonly known as: WESTCORT Apply 1 application topically 2 (two) times daily as needed. Special Instructions: apply to brow with ketoconazole cream   ketoconazole 2 % cream Commonly known as: NIZORAL Apply 1 application topically 2 (two) times daily as needed for irritation. Special Instructions:  apply to brow along with hydrocortisone cream   Ketoconazole-Hydrocortisone 2-2.5 % Crea Apply 1 application topically 2 (two) times daily as needed. Apply to face for rash one time daily as needed.   Linzess 290 MCG Caps capsule Generic drug: linaclotide Take 290 mcg by mouth daily. Take by mouth 39minutes before breakfast on an empty stomach at 7:30am. Be sure to swallow whole   loratadine 10 MG tablet Commonly known as: CLARITIN Take 10 mg by mouth in the morning and at bedtime.   LORazepam 0.5 MG tablet Commonly known as: ATIVAN Take 1 tablet (0.5 mg total) by mouth 2 (two) times daily.   magnesium hydroxide 400 MG/5ML suspension Commonly known as: MILK OF MAGNESIA Take 30 mLs by mouth daily as needed.   MIRALAX PO Take by mouth in the morning. Take  2 capful; Special Instructions: MIX WITH 8oz OF FLUIDS. ** RES. REQUESTED AM DOSAGE GIVEN AT 6:30AM**   polyethylene glycol 17 g packet Commonly known as: MIRALAX / GLYCOLAX Take 17 g by mouth in the morning and at bedtime. Bedtime and Once a day-PRN   mometasone 0.1 % cream Commonly known as: ELOCON May self administer to face twice daily as needed.   naproxen 500 MG tablet Commonly known as: NAPROSYN Take 500 mg by mouth 2 (two) times daily with a meal.   ORAL GEL ANESTHETIC MT Use as directed in the mouth or throat every 4 (four) hours as needed.   oseltamivir 75 MG capsule Commonly known as: TAMIFLU Take 75 mg by mouth 2 (two) times daily.   OYSTER SHELL PO Take 250 mg by mouth in the morning and at bedtime.   saccharomyces boulardii 250 MG capsule Commonly known as: FLORASTOR Take 250 mg by mouth. 8am   spironolactone 25 MG tablet Commonly known as: ALDACTONE Take 12.5 mg by mouth daily.   triamcinolone cream 0.5 % Commonly known as: KENALOG  Apply 1 application topically 2 (two) times daily. Apply to back for itching in the morning and evening.        Review of Systems  Constitutional:  Positive for  fatigue and fever. Negative for appetite change.  HENT:  Positive for congestion and hearing loss. Negative for rhinorrhea and voice change.        Hoarseness  Eyes:  Positive for visual disturbance.       Low vision  Respiratory:  Positive for cough. Negative for shortness of breath and wheezing.   Cardiovascular:  Negative for leg swelling.  Gastrointestinal:  Negative for abdominal pain and constipation.  Genitourinary:  Negative for dysuria and frequency.  Musculoskeletal:  Positive for arthralgias and gait problem. Negative for back pain.       Left lower leg pain, warmth, swelling-better  Skin:  Negative for color change.  Neurological:  Negative for speech difficulty, weakness and headaches.       Memory lapses  Psychiatric/Behavioral:  Negative for behavioral problems and sleep disturbance. The patient is not nervous/anxious.    Immunization History  Administered Date(s) Administered   Influenza Split 07/23/2013   Influenza Whole 07/05/2009, 06/26/2018   Influenza-Unspecified 07/29/2014, 06/14/2015, 07/11/2016, 07/15/2017, 06/27/2019, 07/05/2020, 07/13/2021   Moderna Sars-Covid-2 Vaccination 09/26/2019, 10/24/2019, 08/02/2020   PFIZER(Purple Top)SARS-COV-2 Vaccination 09/26/2019   PPD Test 06/17/2013   Pneumococcal Conjugate-13 07/22/2017   Pneumococcal Polysaccharide-23 05/05/2009   Td 05/05/2009, 07/16/2019   Unspecified SARS-COV-2 Vaccination 02/21/2021, 06/13/2021   Zoster, Live 01/09/2011   Pertinent  Health Maintenance Due  Topic Date Due   INFLUENZA VACCINE  Completed   DEXA SCAN  Completed   Fall Risk 07/29/2014 06/04/2017 06/10/2018 01/05/2020 03/10/2021  Falls in the past year? No Yes No - -  Was there an injury with Fall? - No - - -  Patient Fall Risk Level - - - High fall risk Moderate fall risk   Functional Status Survey:    Vitals:   09/08/21 1117  BP: 108/68  Pulse: 80  Resp: 18  Temp: 98.7 F (37.1 C)  SpO2: 94%  Weight: 145 lb 12.8 oz (66.1 kg)   Height: 5\' 6"  (1.676 m)   Body mass index is 23.53 kg/m. Physical Exam Constitutional:      Comments: Generalized weakness, malaise  HENT:     Head: Normocephalic.     Comments: No erythema or drainage in thorat    Nose: Congestion present.     Mouth/Throat:     Mouth: Mucous membranes are moist.     Pharynx: No oropharyngeal exudate or posterior oropharyngeal erythema.  Eyes:     Extraocular Movements: Extraocular movements intact.     Conjunctiva/sclera: Conjunctivae normal.     Pupils: Pupils are equal, round, and reactive to light.     Comments: Legally blind  Cardiovascular:     Rate and Rhythm: Normal rate and regular rhythm.     Heart sounds: No murmur heard.    Comments: DP pulses R+L present.  Pulmonary:     Effort: Pulmonary effort is normal.     Breath sounds: Rales present. No wheezing or rhonchi.     Comments: Posterior left lung base.  Abdominal:     General: Bowel sounds are normal.     Palpations: Abdomen is soft.     Tenderness: There is no abdominal tenderness.  Musculoskeletal:        General: Swelling and tenderness present.     Cervical back: Normal range of motion and neck  supple.     Right lower leg: No edema.     Left lower leg: Edema present.     Comments: Left lower leg/foot swelling, pain-mostly in ankle and calf-worsens with palpation and left foot dorsiflexion, warmth-resolving.   Skin:    General: Skin is warm and dry.     Findings: No bruising or erythema.  Neurological:     General: No focal deficit present.     Mental Status: She is alert. Mental status is at baseline.     Gait: Gait abnormal.     Comments: Oriented to person, place.   Psychiatric:        Mood and Affect: Mood normal.        Behavior: Behavior normal.        Thought Content: Thought content normal.    Labs reviewed: Recent Labs    03/15/21 0000 08/15/21 0000 09/07/21 0000  NA 140 139 137  K 4.4 4.5 4.3  CL 104 101 104  CO2 24* 27* 23*  BUN 22* 19 18   CREATININE 1.0 0.9 1.1  CALCIUM 9.7 9.6 9.1   Recent Labs    03/15/21 0000 08/15/21 0000 09/07/21 0000  AST 25 25 27   ALT 19 18 18   ALKPHOS 92 146* 121  ALBUMIN 4.3 4.1 7.2*   Recent Labs    03/15/21 0000 08/15/21 0000 09/07/21 0000  WBC 5.5 7.7 4.9  NEUTROABS 2,877.00 4,843.00 3,798.00  HGB 12.2 12.2 12.4  HCT 36 36 38  PLT 335 362 263   Lab Results  Component Value Date   TSH 1.62 08/15/2021   Lab Results  Component Value Date   HGBA1C 5.9 02/05/2017   Lab Results  Component Value Date   CHOL 247 (A) 11/21/2018   HDL 72 (A) 11/21/2018   LDLCALC 147 11/21/2018   LDLDIRECT 190.0 03/24/2012   TRIG 150 11/21/2018   CHOLHDL 4 03/24/2012    Significant Diagnostic Results in last 30 days:  No results found.  Assessment/Plan: Influenza congestion, cough, fever, pending CXR, tested positive Flu. The patient was noted generalized weakness, malaise, cough. Oral intake for breakfast remains no change. 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 09/07/21 uric acid 6.4, wbc 4.9, Hgb 12.4, plt 263, neutrophils 49, CRP 4.8, Na 137, K 4.3, Bun 18, creat 1.05, X-ray L tibia/fibula, ankle, foot, no acute abnormality, noted heel spur. Tylenol, Norco for pain as well. Pending venous US  Essential hypertension Blood pressure is controlled, on Furosemide, Spironolactone, ASA 81mg  qd  GERD (gastroesophageal reflux disease)  stable, on Famotidine, Maalox qd/prn  Depression with anxiety Her mood is stable, on Amitriptyline, Librium, Fluphenazine, Lorazepam, TSH 2.76 10/06/20  Constipation Stable,  on prn Dulcolax po/suppository, Linzess, prn MOM, MiraLax bid/prn  Allergic rhinitis takes Claritin, Flonase, Mucinex           Family/ staff Communication: plan  of care reviewed with the patient and charge nurse.   Labs/tests ordered:  CXR ap/lateral  Time spend 40 minutes.

## 2021-09-08 NOTE — Assessment & Plan Note (Addendum)
Left lower leg, ankle, foot pain, mostly in ankle and calf, swelling, warmth, improving, started Naproxen 500mg  bid x 7 days 09/07/21. 09/07/21 uric acid 6.4, wbc 4.9, Hgb 12.4, plt 263, neutrophils 49, CRP 4.8, Na 137, K 4.3, Bun 18, creat 1.05, X-ray L tibia/fibula, ankle, foot, no acute abnormality, noted heel spur. Tylenol, Norco for pain as well. Pending venous US

## 2021-09-08 NOTE — Assessment & Plan Note (Signed)
takes Claritin, Flonase, Mucinex       

## 2021-09-08 NOTE — Assessment & Plan Note (Signed)
stable, on Famotidine, Maalox qd/prn

## 2021-09-08 NOTE — Assessment & Plan Note (Signed)
congestion, cough, fever, pending CXR, tested positive Flu. The patient was noted generalized weakness, malaise, cough. Oral intake for breakfast remains no change. Will start Tamiflu 75mg  bid x 5days, Mucinex 600mg  bid x5days

## 2021-09-09 ENCOUNTER — Encounter: Payer: Self-pay | Admitting: Nurse Practitioner

## 2021-09-14 ENCOUNTER — Non-Acute Institutional Stay: Payer: Medicare Other | Admitting: Nurse Practitioner

## 2021-09-14 ENCOUNTER — Encounter: Payer: Self-pay | Admitting: Nurse Practitioner

## 2021-09-14 DIAGNOSIS — J302 Other seasonal allergic rhinitis: Secondary | ICD-10-CM

## 2021-09-14 DIAGNOSIS — M7989 Other specified soft tissue disorders: Secondary | ICD-10-CM

## 2021-09-14 DIAGNOSIS — I10 Essential (primary) hypertension: Secondary | ICD-10-CM

## 2021-09-14 DIAGNOSIS — M159 Polyosteoarthritis, unspecified: Secondary | ICD-10-CM

## 2021-09-14 DIAGNOSIS — J111 Influenza due to unidentified influenza virus with other respiratory manifestations: Secondary | ICD-10-CM | POA: Diagnosis not present

## 2021-09-14 DIAGNOSIS — M79605 Pain in left leg: Secondary | ICD-10-CM | POA: Diagnosis not present

## 2021-09-14 DIAGNOSIS — M8000XA Age-related osteoporosis with current pathological fracture, unspecified site, initial encounter for fracture: Secondary | ICD-10-CM

## 2021-09-14 DIAGNOSIS — K219 Gastro-esophageal reflux disease without esophagitis: Secondary | ICD-10-CM

## 2021-09-14 DIAGNOSIS — R300 Dysuria: Secondary | ICD-10-CM

## 2021-09-14 DIAGNOSIS — J3089 Other allergic rhinitis: Secondary | ICD-10-CM

## 2021-09-14 DIAGNOSIS — K5909 Other constipation: Secondary | ICD-10-CM

## 2021-09-14 DIAGNOSIS — F0390 Unspecified dementia without behavioral disturbance: Secondary | ICD-10-CM

## 2021-09-14 DIAGNOSIS — F418 Other specified anxiety disorders: Secondary | ICD-10-CM

## 2021-09-14 NOTE — Assessment & Plan Note (Signed)
stopped taking Prolia due to cost, ? Allergic to Fosamax.

## 2021-09-14 NOTE — Progress Notes (Signed)
Location:   [facility] Room Number: 169 Place of Service:  ALF (13) Provider: Lennie Odor Hoang Pettingill NP  [name], MD  Patient Care Team: [name], MD as PCP - General (Internal Medicine) [name], MD (Ophthalmology) [name], MD (Psychiatry) Tamyrah Burbage X, NP as Nurse Practitioner (Nurse Practitioner) [name], MD as Consulting Physician (Orthopedic Surgery)  Extended Emergency Contact Information Primary Emergency Contact: [name], [place] Work Phone: [phone] Relation: Son Secondary Emergency Contact: [name] [place] Phone: [phone] Relation: Sister  Code Status: DNR Goals of care: Advanced Directive information Advanced Directives 09/08/2021  Does Patient Have a Medical Advance Directive? Yes  Type of Paramed[place] of [place];Out of facility DNR (pink MOST or yellow form)  Does patient want to make changes to medical advance directive? No - Patient declined  Copy of [place] in Chart? Yes - validated most recent copy scanned in chart (See row information)  Pre-existing out of facility DNR order (yellow form or pink MOST form) Pink MOST/Yellow Form most recent copy in chart - Physician notified to receive inpatient order     Chief Complaint  Patient presents with   Acute Visit    Burning sensation upon urination.     HPI:  Pt is a 80 y.o. female seen today for an acute visit for c/o burning sensation upon urination, urinary frequency/urgency, lower abd pain/discomfort, denied incontinent of urine, blood in urine.     Flu, improved congestion, cough, fever, completed Tamiflu.                Left lower leg, ankle, foot pain, mostly in ankle and calf, swelling, warmth, improving, started Naproxen 500mg  bid x 7 days 09/07/21. 09/07/21 uric acid 6.4, wbc 4.9, Hgb 12.4, plt 263, neutrophils 49, CRP 4.8, Na 137, K 4.3, Bun  18, creat 1.05, X-ray L tibia/fibula, ankle, foot, no acute abnormality, noted heel spur. Venous US negative for DVT.              HTN, on Furosemide, Spironolactone, ASA 81mg  qd             OA on Tylenol, Norco             GERD, stable, on Famotidine, Maalox qd/prn             Depression/anxiety, on Amitriptyline, Librium, Fluphenazine, Lorazepam, TSH 2.76 10/06/20  Dementia, functioning well in [facility], 03/29/21 MMSE 20/30, no clock drawing.              Chronic constipation, on prn Dulcolax po/suppository, Linzess, prn MOM, MiraLax bid/prn             Allergic rhinitis, takes Claritin, Flonase, Mucinex        OP, stopped taking Prolia due to cost, ? Allergic to Fosamax.   Past Medical History:  Diagnosis Date   Allergy    Anemia    Anxiety    Asthma    CAD (coronary artery disease)    Depression    Diverticulosis    Dysfunction of eustachian tube    Elevated LFTs    Esophageal reflux    Esophageal stricture 2002   Hemorrhoids    History of rectal polyps    HLD (hyperlipidemia)    HTN (hypertension)    Hypertonicity of bladder    Irritable bowel syndrome with constipation  Obesity    Optic neuritis    Osteoporosis    Peptic ulcer disease    Peripheral neuropathy    Trigeminal neuralgia    prior injury   Visual loss, bilateral    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, 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   Vitamin D deficiency    Past Surgical History:  Procedure Laterality Date   ABDOMINAL HYSTERECTOMY     ANAL RECTAL MANOMETRY N/A 07/05/2014   Procedure: ANO RECTAL MANOMETRY;  Surgeon: Leighton Ruff, MD;  Location: [facility];  Service: Endoscopy;  Laterality: N/A;   APPENDECTOMY     CAROTID ENDARTERECTOMY     COLONOSCOPY  2008, 2015   TONSILLECTOMY     UPPER GASTROINTESTINAL ENDOSCOPY  2002    Allergies  Allergen Reactions   Biaxin [Clarithromycin]    Clarithromycin    Doxycycline    Fosamax [Alendronate Sodium]    Geodon  [Ziprasidone Hydrochloride]    Lipitor [Atorvastatin Calcium]    Penicillins    Pravachol [Pravastatin Sodium]    Statins    Sulfonamide Derivatives    Zithromax [Azithromycin]    Zocor [Simvastatin]     Allergies as of 09/14/2021       Reactions   Biaxin [clarithromycin]    Clarithromycin    Doxycycline    Fosamax [alendronate Sodium]    Geodon [ziprasidone Hydrochloride]    Lipitor [atorvastatin Calcium]    Penicillins    Pravachol [pravastatin Sodium]    Statins    Sulfonamide Derivatives    Zithromax [azithromycin]    Zocor [simvastatin]         Medication List        Accurate as of September 14, 2021 11:59 PM. If you have any questions, ask your nurse or doctor.          Tylenol 325 MG Caps Generic drug: Acetaminophen Take 650 mg by mouth in the morning and at bedtime. AS NEEDED FOR PAIN *NOT TO EXCEED 3,000 MG IN 24HRS*   acetaminophen 325 MG tablet Commonly known as: TYLENOL Take 650 mg by mouth every 8 (eight) hours as needed. 8am & 8pm AS NEEDED FOR PAIN *NOT TO EXCEED 3,000 MG IN 24HRS*   alum & mag hydroxide-simeth 381-829-93 MG/5ML suspension Commonly known as: MAALOX/MYLANTA Take 30 mLs by mouth at bedtime. 9pm   alum & mag hydroxide-simeth 200-200-20 MG/5ML suspension Commonly known as: MAALOX/MYLANTA Take 30 mLs by mouth every 4 (four) hours as needed for indigestion or heartburn.   amitriptyline 50 MG tablet Commonly known as: ELAVIL Take 50 mg by mouth at bedtime. 9pm   ARTIFICIAL TEAR OP Apply 1 drop to eye 3 (three) times daily. Both Eyes at 8am, 2pm, and 9pm.   aspirin 81 MG EC tablet Take 81 mg by mouth daily. 9am   betamethasone valerate 0.1 % cream Commonly known as: VALISONE Apply 1 application topically as needed. Apply  thin amount to vulva twice daily.   Biofreeze 4 % Gel Generic drug: Menthol (Topical Analgesic) Apply topically in the morning, at noon, in the evening, and at bedtime. Apply to knee   bisacodyl  10 MG suppository Commonly known as: DULCOLAX Place 10 mg rectally daily as needed for moderate constipation.   bisacodyl 5 MG EC tablet Commonly known as: DULCOLAX Take 5 mg by mouth daily as needed for moderate constipation.   camphor-menthol lotion Commonly known as: SARNA Apply topically. 0.5-0.5 % Three times a day as needed to mid back   carbamide peroxide 6.5 %  OTIC solution Commonly known as: DEBROX Place 5 drops to affected ear every night for 3 days, then irrigate with bulb syringe. If not effective, notify MD.   CENTRUM SILVER PO Take 1 tablet by mouth. 8am   chlordiazePOXIDE 25 MG capsule Commonly known as: LIBRIUM Take one capsule by mouth at bedtime   diclofenac Sodium 1 % Gel Commonly known as: VOLTAREN Apply 2 g topically 2 (two) times daily as needed. apply to lower back area   famotidine 20 MG tablet Commonly known as: PEPCID Take 20 mg by mouth at bedtime. 8pm   fluPHENAZine 1 MG tablet Commonly known as: PROLIXIN Take 1 mg by mouth at bedtime. 8pm.   fluticasone 50 MCG/ACT nasal spray Commonly known as: FLONASE Place 1 spray into both nostrils 2 (two) times daily. 8am & 8pm.   furosemide 20 MG tablet Commonly known as: LASIX Take 20 mg by mouth daily. 8am   guaiFENesin 600 MG 12 hr tablet Commonly known as: MUCINEX Take 600 mg by mouth 2 (two) times daily as needed. (1) tab, oral, As Needed, Administer med as needed for cold/congestion.   HYDROcodone-acetaminophen 5-325 MG tablet Commonly known as: Norco Take 1 tablet by mouth daily.   hydrocortisone 2.5 % cream Apply 1 application topically 2 (two) times daily as needed. Apply to affected areas on face.   hydrocortisone cream 1 % Apply 1 application topically 2 (two) times daily. May self apply twice daily to Hemorrhoids.   hydrocortisone valerate cream 0.2 % Commonly known as: WESTCORT Apply 1 application topically 2 (two) times daily as needed. Special Instructions: apply to brow with  ketoconazole cream   ketoconazole 2 % cream Commonly known as: NIZORAL Apply 1 application topically 2 (two) times daily as needed for irritation. Special Instructions: apply to brow along with hydrocortisone cream   Ketoconazole-Hydrocortisone 2-2.5 % Crea Apply 1 application topically 2 (two) times daily as needed. Apply to face for rash one time daily as needed.   Linzess 290 MCG Caps capsule Generic drug: linaclotide Take 290 mcg by mouth daily. Take by mouth 31minutes before breakfast on an empty stomach at 7:30am. Be sure to swallow whole   loratadine 10 MG tablet Commonly known as: CLARITIN Take 10 mg by mouth in the morning and at bedtime.   LORazepam 0.5 MG tablet Commonly known as: ATIVAN Take 1 tablet (0.5 mg total) by mouth 2 (two) times daily.   magnesium hydroxide 400 MG/5ML suspension Commonly known as: MILK OF MAGNESIA Take 30 mLs by mouth daily as needed.   MIRALAX PO Take by mouth in the morning. Take  2 capful; Special Instructions: MIX WITH 8oz OF FLUIDS. ** RES. REQUESTED AM DOSAGE GIVEN AT 6:30AM**   polyethylene glycol 17 g packet Commonly known as: MIRALAX / GLYCOLAX Take 17 g by mouth in the morning and at bedtime. Bedtime and Once a day-PRN   mometasone 0.1 % cream Commonly known as: ELOCON May self administer to face twice daily as needed.   naproxen 500 MG tablet Commonly known as: NAPROSYN Take 500 mg by mouth 2 (two) times daily with a meal.   ORAL GEL ANESTHETIC MT Use as directed in the mouth or throat every 4 (four) hours as needed.   OYSTER SHELL PO Take 250 mg by mouth in the morning and at bedtime.   saccharomyces boulardii 250 MG capsule Commonly known as: FLORASTOR Take 250 mg by mouth. 8am   spironolactone 25 MG tablet Commonly known as: ALDACTONE Take 12.5 mg  by mouth daily.   triamcinolone cream 0.5 % Commonly known as: KENALOG Apply 1 application topically 2 (two) times daily. Apply to back for itching in the morning  and evening.        Review of Systems  Constitutional:  Positive for fatigue. Negative for appetite change and fever.  HENT:  Positive for hearing loss. Negative for congestion, rhinorrhea and voice change.        Hoarseness  Eyes:  Positive for visual disturbance.       Low vision  Respiratory:  Positive for cough. Negative for shortness of breath.   Cardiovascular:  Negative for chest pain and leg swelling.  Gastrointestinal:  Positive for abdominal pain. Negative for constipation, nausea and vomiting.       Lower abd discomfort.   Genitourinary:  Positive for dysuria. Negative for frequency, hematuria and urgency.  Musculoskeletal:  Positive for arthralgias and gait problem. Negative for back pain.       Left lower leg pain, warmth, swelling-resolved.   Skin:  Negative for color change.  Neurological:  Negative for speech difficulty, weakness and headaches.       Memory lapses  Psychiatric/Behavioral:  Negative for behavioral problems and sleep disturbance. The patient is not nervous/anxious.    Immunization History  Administered Date(s) Administered   Influenza Split 07/23/2013   Influenza Whole 07/05/2009, 06/26/2018   Influenza-Unspecified 07/29/2014, 06/14/2015, 07/11/2016, 07/15/2017, 06/27/2019, 07/05/2020, 07/13/2021   Moderna Sars-Covid-2 Vaccination 09/26/2019, 10/24/2019, 08/02/2020   PFIZER(Purple Top)SARS-COV-2 Vaccination 09/26/2019   PPD Test 06/17/2013   Pneumococcal Conjugate-13 07/22/2017   Pneumococcal Polysaccharide-23 05/05/2009   Td 05/05/2009, 07/16/2019   Unspecified SARS-COV-2 Vaccination 02/21/2021, 06/13/2021   Zoster, Live 01/09/2011   Pertinent  Health Maintenance Due  Topic Date Due   INFLUENZA VACCINE  Completed   DEXA SCAN  Completed   Fall Risk 07/29/2014 06/04/2017 06/10/2018 01/05/2020 03/10/2021  Falls in the past year? No Yes No - -  Was there an injury with Fall? - No - - -  Patient Fall Risk Level - - - High fall risk  Moderate fall risk   Functional Status Survey:    Vitals:   09/14/21 0943  BP: (!) 164/74  Pulse: 70  Resp: 20  Temp: (!) 97.1 F (36.2 C)  SpO2: 95%   There is no height or weight on file to calculate BMI. Physical Exam Constitutional:      Comments: Generalized weakness improved.   HENT:     Head: Normocephalic.     Comments: No erythema or drainage in thorat    Nose: Nose normal.     Mouth/Throat:     Mouth: Mucous membranes are moist.  Eyes:     Extraocular Movements: Extraocular movements intact.     Conjunctiva/sclera: Conjunctivae normal.     Pupils: Pupils are equal, round, and reactive to light.     Comments: Legally blind  Cardiovascular:     Rate and Rhythm: Normal rate and regular rhythm.     Heart sounds: No murmur heard.    Comments: DP pulses R+L present.  Pulmonary:     Effort: Pulmonary effort is normal.     Breath sounds: Rales present. No wheezing or rhonchi.     Comments: Posterior left lung base.  Abdominal:     General: Bowel sounds are normal. There is no distension.     Palpations: Abdomen is soft.     Tenderness: There is abdominal tenderness. There is no right CVA tenderness, left CVA tenderness,  guarding or rebound.     Comments: Suprapubic and lower back area discomfort.   Musculoskeletal:     Cervical back: Normal range of motion and neck supple.     Right lower leg: No edema.     Left lower leg: Edema present.     Comments: Left lower leg/foot swelling, pain-mostly in ankle and calf-worsens with palpation and left foot dorsiflexion, warmth-resolving.   Skin:    General: Skin is warm and dry.  Neurological:     General: No focal deficit present.     Mental Status: She is alert. Mental status is at baseline.     Gait: Gait abnormal.     Comments: Oriented to person, place.   Psychiatric:        Mood and Affect: Mood normal.        Behavior: Behavior normal.        Thought Content: Thought content normal.     Comments: The patient  appears at her baseline mentation during my examination.     Labs reviewed: Recent Labs    03/15/21 0000 08/15/21 0000 09/07/21 0000  NA 140 139 137  K 4.4 4.5 4.3  CL 104 101 104  CO2 24* 27* 23*  BUN 22* 19 18  CREATININE 1.0 0.9 1.1  CALCIUM 9.7 9.6 9.1   Recent Labs    03/15/21 0000 08/15/21 0000 09/07/21 0000  AST 25 25 27   ALT 19 18 18   ALKPHOS 92 146* 121  ALBUMIN 4.3 4.1 7.2*   Recent Labs    03/15/21 0000 08/15/21 0000 09/07/21 0000  WBC 5.5 7.7 4.9  NEUTROABS 2,877.00 4,843.00 3,798.00  HGB 12.2 12.2 12.4  HCT 36 36 38  PLT 335 362 263   Lab Results  Component Value Date   TSH 1.62 08/15/2021   Lab Results  Component Value Date   HGBA1C 5.9 02/05/2017   Lab Results  Component Value Date   CHOL 247 (A) 11/21/2018   HDL 72 (A) 11/21/2018   LDLCALC 147 11/21/2018   LDLDIRECT 190.0 03/24/2012   TRIG 150 11/21/2018   CHOLHDL 4 03/24/2012    Significant Diagnostic Results in last 30 days:  No results found.  Assessment/Plan: Dysuria Obtain UA C/S c/o burning sensation upon urination, urinary frequency/urgency, lower abd pain/discomfort, denied incontinent of urine, blood in urine.   Influenza improved congestion, cough, fever, completed Tamiflu.   Pain and swelling of left lower extremity  Left lower leg, ankle, foot pain, mostly in ankle and calf, swelling, warmth, improving, completed 7 days course of Naproxen 500mg  bid. 09/07/21 uric acid 6.4, wbc 4.9, Hgb 12.4, plt 263, neutrophils 49, CRP 4.8, Na 137, K 4.3, Bun 18, creat 1.05, X-ray L tibia/fibula, ankle, foot, no acute abnormality, noted heel spur. Venous US negative for DVT.   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    Constipation  on prn Dulcolax po/suppository, Linzess, prn MOM, MiraLax bid/prn  Seasonal and perennial allergic rhinitis  takes Claritin, Flonase, Mucinex         Osteoporosis stopped taking Prolia due to cost, ? Allergic to Fosamax.      Family/  staff Communication: plan of care reviewed with the patient and charge nurse.   Labs/tests ordered:  UA C/S  Time spend 40 minutes.

## 2021-09-14 NOTE — Assessment & Plan Note (Signed)
on prn Dulcolax po/suppository, Linzess, prn MOM, MiraLax bid/prn

## 2021-09-14 NOTE — Assessment & Plan Note (Addendum)
Blood pressure is controlled, continue Furosemide, Spironolactone, ASA 81mg  qd

## 2021-09-14 NOTE — Assessment & Plan Note (Signed)
improved congestion, cough, fever, completed Tamiflu.

## 2021-09-14 NOTE — Assessment & Plan Note (Signed)
Left lower leg, ankle, foot pain, mostly in ankle and calf, swelling, warmth, improving, completed 7 days course of Naproxen 500mg  bid. 09/07/21 uric acid 6.4, wbc 4.9, Hgb 12.4, plt 263, neutrophils 49, CRP 4.8, Na 137, K 4.3, Bun 18, creat 1.05, X-ray L tibia/fibula, ankle, foot, no acute abnormality, noted heel spur. Venous US negative for DVT.

## 2021-09-14 NOTE — Assessment & Plan Note (Signed)
stable, on Famotidine, Maalox qd/prn

## 2021-09-14 NOTE — Assessment & Plan Note (Signed)
takes Claritin, Flonase, Mucinex       

## 2021-09-14 NOTE — Assessment & Plan Note (Signed)
Pain is controlled, continue Tylenol, Norco

## 2021-09-14 NOTE — Assessment & Plan Note (Addendum)
functioning well in [facility], 03/29/21 MMSE 20/30, no clock drawing.

## 2021-09-14 NOTE — Assessment & Plan Note (Signed)
on Amitriptyline, Librium, Fluphenazine, Lorazepam, TSH 2.76 10/06/20

## 2021-09-14 NOTE — Assessment & Plan Note (Addendum)
Obtain UA C/S c/o burning sensation upon urination, urinary frequency/urgency, lower abd pain/discomfort, denied incontinent of urine, blood in urine.

## 2021-09-15 ENCOUNTER — Non-Acute Institutional Stay (INDEPENDENT_AMBULATORY_CARE_PROVIDER_SITE_OTHER): Payer: Medicare Other | Admitting: Nurse Practitioner

## 2021-09-15 ENCOUNTER — Encounter: Payer: Self-pay | Admitting: Nurse Practitioner

## 2021-09-15 DIAGNOSIS — Z0000 Encounter for general adult medical examination without abnormal findings: Secondary | ICD-10-CM | POA: Diagnosis not present

## 2021-09-19 ENCOUNTER — Encounter: Payer: Self-pay | Admitting: Nurse Practitioner

## 2021-09-19 ENCOUNTER — Non-Acute Institutional Stay: Payer: Medicare Other | Admitting: Nurse Practitioner

## 2021-09-19 DIAGNOSIS — J111 Influenza due to unidentified influenza virus with other respiratory manifestations: Secondary | ICD-10-CM

## 2021-09-19 DIAGNOSIS — R410 Disorientation, unspecified: Secondary | ICD-10-CM | POA: Diagnosis not present

## 2021-09-19 DIAGNOSIS — K219 Gastro-esophageal reflux disease without esophagitis: Secondary | ICD-10-CM

## 2021-09-19 DIAGNOSIS — M159 Polyosteoarthritis, unspecified: Secondary | ICD-10-CM | POA: Diagnosis not present

## 2021-09-19 DIAGNOSIS — I10 Essential (primary) hypertension: Secondary | ICD-10-CM

## 2021-09-19 DIAGNOSIS — F418 Other specified anxiety disorders: Secondary | ICD-10-CM

## 2021-09-19 DIAGNOSIS — F0390 Unspecified dementia without behavioral disturbance: Secondary | ICD-10-CM

## 2021-09-19 DIAGNOSIS — M8000XA Age-related osteoporosis with current pathological fracture, unspecified site, initial encounter for fracture: Secondary | ICD-10-CM

## 2021-09-19 DIAGNOSIS — K5909 Other constipation: Secondary | ICD-10-CM

## 2021-09-19 DIAGNOSIS — J309 Allergic rhinitis, unspecified: Secondary | ICD-10-CM

## 2021-09-19 LAB — CBC AND DIFFERENTIAL
HCT: 37 (ref 36–46)
Hemoglobin: 11.9 — AB (ref 12.0–16.0)
Neutrophils Absolute: 6365
Platelets: 465 — AB (ref 150–399)
WBC: 10.3

## 2021-09-19 LAB — HEPATIC FUNCTION PANEL
ALT: 13 (ref 7–35)
AST: 20 (ref 13–35)
Alkaline Phosphatase: 131 — AB (ref 25–125)
Bilirubin, Total: 0.3

## 2021-09-19 LAB — BASIC METABOLIC PANEL
BUN: 20 (ref 4–21)
CO2: 26 — AB (ref 13–22)
Chloride: 102 (ref 99–108)
Creatinine: 1 (ref 0.5–1.1)
Glucose: 108
Potassium: 4.8 (ref 3.4–5.3)
Sodium: 137 (ref 137–147)

## 2021-09-19 LAB — CBC: RBC: 3.99 (ref 3.87–5.11)

## 2021-09-19 LAB — COMPREHENSIVE METABOLIC PANEL
Albumin: 3.8 (ref 3.5–5.0)
Calcium: 9.3 (ref 8.7–10.7)
Globulin: 2.8

## 2021-09-19 NOTE — Progress Notes (Addendum)
Location:   [place] [place] Room Number: [place] of Service:  [phone]) Provider:  Marda Stalker, Lennie Odor NP   [name], MD  Patient Care Team: [name], MD as PCP - General (Internal Medicine) [name], MD (Ophthalmology) [name], MD (Psychiatry) Qasim Diveley X, NP as Nurse Practitioner (Nurse Practitioner) [name], MD as Consulting Physician (Orthopedic Surgery)  Extended Emergency Contact Information Primary Emergency Contact: [name], [place] Work Phone: [phone] Relation: Son Secondary Emergency Contact: [name] [place] Phone: [phone] Relation: Sister  Code Status:  DNR Managed Care Goals of care: Advanced Directive information Advanced Directives 09/19/2021  Does Patient Have a Medical Advance Directive? Yes  Type of Paramedic of [place];Out of facility DNR (pink MOST or yellow form)  Does patient want to make changes to medical advance directive? No - Patient declined  Copy of [place] in Chart? Yes - validated most recent copy scanned in chart (See row information)  Pre-existing out of facility DNR order (yellow form or pink MOST form) Pink MOST/Yellow Form most recent copy in chart - Physician notified to receive inpatient order     Chief Complaint  Patient presents with   Acute Visit    Confusion    HPI:  Pt is a 80 y.o. female seen today for an acute visit for reported the patient's confusion, UA C/S negative for UTI 09/15/21, no noted focal neurological symptoms. Staff reported the patient's appetite has been decreased since onset of Flu 09/08/21.   Flu, resolved, completed Tamiflu.    Left lower leg, ankle, foot pain, mostly in ankle and calf, swelling, warmth, healed, completed Naproxen 54m bid x7 days, 09/07/21 uric acid 6.4, wbc 4.9, Hgb 12.4, plt 263, neutrophils 49, CRP 4.8, Na 137,  K 4.3, Bun 18, creat 1.05, X-ray L tibia/fibula, ankle, foot, no acute abnormality, noted heel spur. Venous UKoreanegative for DVT.              HTN, on Furosemide, Spironolactone, ASA 866mqd             OA on Tylenol, Norco             GERD, stable, on Famotidine, Maalox qd/prn             Depression/anxiety, on Amitriptyline, Librium, Fluphenazine, Lorazepam, TSH 2.76 10/06/20             Dementia, functioning well in [facility], 03/29/21 MMSE 20/30, no clock drawing.              Chronic constipation, on prn Dulcolax po/suppository, Linzess, prn MOM, MiraLax bid/prn             Allergic rhinitis, takes Claritin, Flonase, Mucinex        OP, stopped taking Prolia due to cost, ? Allergic to Fosamax.       Past Medical History:  Diagnosis Date   Allergy    Anemia    Anxiety    Asthma    CAD (coronary artery disease)    Depression    Diverticulosis    Dysfunction of eustachian tube    Elevated LFTs    Esophageal reflux    Esophageal stricture 2002   Hemorrhoids    History of rectal polyps    HLD (hyperlipidemia)    HTN (hypertension)    Hypertonicity of bladder  Irritable bowel syndrome with constipation    Obesity    Optic neuritis    Osteoporosis    Peptic ulcer disease    Peripheral neuropathy    Trigeminal neuralgia    prior injury   Visual loss, bilateral    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, 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   Vitamin D deficiency    Past Surgical History:  Procedure Laterality Date   ABDOMINAL HYSTERECTOMY     ANAL RECTAL MANOMETRY N/A 07/05/2014   Procedure: ANO RECTAL MANOMETRY;  Surgeon: Leighton Ruff, MD;  Location: [facility];  Service: Endoscopy;  Laterality: N/A;   APPENDECTOMY     CAROTID ENDARTERECTOMY     COLONOSCOPY  2008, 2015   TONSILLECTOMY     UPPER GASTROINTESTINAL ENDOSCOPY  2002    Allergies  Allergen Reactions   Biaxin [Clarithromycin]    Clarithromycin    Doxycycline    Fosamax  [Alendronate Sodium]    Geodon [Ziprasidone Hydrochloride]    Lipitor [Atorvastatin Calcium]    Penicillins    Pravachol [Pravastatin Sodium]    Statins    Sulfonamide Derivatives    Zithromax [Azithromycin]    Zocor [Simvastatin]     Allergies as of 09/19/2021       Reactions   Biaxin [clarithromycin]    Clarithromycin    Doxycycline    Fosamax [alendronate Sodium]    Geodon [ziprasidone Hydrochloride]    Lipitor [atorvastatin Calcium]    Penicillins    Pravachol [pravastatin Sodium]    Statins    Sulfonamide Derivatives    Zithromax [azithromycin]    Zocor [simvastatin]         Medication List        Accurate as of September 19, 2021 11:59 PM. If you have any questions, ask your nurse or doctor.          Tylenol 325 MG Caps Generic drug: Acetaminophen Take 650 mg by mouth in the morning and at bedtime. AS NEEDED FOR PAIN *NOT TO EXCEED 3,000 MG IN 24HRS*   acetaminophen 325 MG tablet Commonly known as: TYLENOL Take 650 mg by mouth every 8 (eight) hours as needed. 8am & 8pm AS NEEDED FOR PAIN *NOT TO EXCEED 3,000 MG IN 24HRS*   alum & mag hydroxide-simeth 627-035-00 MG/5ML suspension Commonly known as: MAALOX/MYLANTA Take 30 mLs by mouth at bedtime. 9pm   alum & mag hydroxide-simeth 200-200-20 MG/5ML suspension Commonly known as: MAALOX/MYLANTA Take 30 mLs by mouth every 4 (four) hours as needed for indigestion or heartburn.   amitriptyline 50 MG tablet Commonly known as: ELAVIL Take 50 mg by mouth at bedtime. 9pm   ARTIFICIAL TEAR OP Apply 1 drop to eye 3 (three) times daily. Both Eyes at 8am, 2pm, and 9pm.   aspirin 81 MG EC tablet Take 81 mg by mouth daily. 9am   betamethasone valerate 0.1 % cream Commonly known as: VALISONE Apply 1 application topically as needed. Apply  thin amount to vulva twice daily.   Biofreeze 4 % Gel Generic drug: Menthol (Topical Analgesic) Apply topically in the morning, at noon, in the evening, and at  bedtime. Apply to knee   bisacodyl 10 MG suppository Commonly known as: DULCOLAX Place 10 mg rectally daily as needed for moderate constipation.   bisacodyl 5 MG EC tablet Commonly known as: DULCOLAX Take 5 mg by mouth daily as needed for moderate constipation.   camphor-menthol lotion Commonly known as: SARNA Apply topically. 0.5-0.5 % Three times a day as needed  to mid back   carbamide peroxide 6.5 % OTIC solution Commonly known as: DEBROX Place 5 drops to affected ear every night for 3 days, then irrigate with bulb syringe. If not effective, notify MD.   CENTRUM SILVER PO Take 1 tablet by mouth. 8am   chlordiazePOXIDE 25 MG capsule Commonly known as: LIBRIUM Take one capsule by mouth at bedtime   diclofenac Sodium 1 % Gel Commonly known as: VOLTAREN Apply 2 g topically 2 (two) times daily as needed. apply to lower back area   famotidine 20 MG tablet Commonly known as: PEPCID Take 20 mg by mouth at bedtime. 8pm   fluPHENAZine 1 MG tablet Commonly known as: PROLIXIN Take 1 mg by mouth at bedtime. 8pm.   fluticasone 50 MCG/ACT nasal spray Commonly known as: FLONASE Place 1 spray into both nostrils 2 (two) times daily. 8am & 8pm.   furosemide 20 MG tablet Commonly known as: LASIX Take 20 mg by mouth daily. 8am   guaiFENesin 600 MG 12 hr tablet Commonly known as: MUCINEX Take 600 mg by mouth 2 (two) times daily as needed. (1) tab, oral, As Needed, Administer med as needed for cold/congestion.   HYDROcodone-acetaminophen 5-325 MG tablet Commonly known as: Norco Take 1 tablet by mouth daily.   hydrocortisone 2.5 % cream Apply 1 application topically 2 (two) times daily as needed. Apply to affected areas on face.   hydrocortisone cream 1 % Apply 1 application topically 2 (two) times daily. May self apply twice daily to Hemorrhoids.   hydrocortisone valerate cream 0.2 % Commonly known as: WESTCORT Apply 1 application topically 2 (two) times daily as needed.  Special Instructions: apply to brow with ketoconazole cream   ketoconazole 2 % cream Commonly known as: NIZORAL Apply 1 application topically 2 (two) times daily as needed for irritation. Special Instructions: apply to brow along with hydrocortisone cream   Ketoconazole-Hydrocortisone 2-2.5 % Crea Apply 1 application topically 2 (two) times daily as needed. Apply to face for rash one time daily as needed.   Linzess 290 MCG Caps capsule Generic drug: linaclotide Take 290 mcg by mouth daily. Take by mouth 54mnutes before breakfast on an empty stomach at 7:30am. Be sure to swallow whole   loratadine 10 MG tablet Commonly known as: CLARITIN Take 10 mg by mouth in the morning and at bedtime.   LORazepam 0.5 MG tablet Commonly known as: ATIVAN Take 1 tablet (0.5 mg total) by mouth 2 (two) times daily.   magnesium hydroxide 400 MG/5ML suspension Commonly known as: MILK OF MAGNESIA Take 30 mLs by mouth daily as needed.   MIRALAX PO Take by mouth in the morning. Take  2 capful; Special Instructions: MIX WITH 8oz OF FLUIDS. ** RES. REQUESTED AM DOSAGE GIVEN AT 6:30AM**   polyethylene glycol 17 g packet Commonly known as: MIRALAX / GLYCOLAX Take 17 g by mouth in the morning and at bedtime. Bedtime and Once a day-PRN   mometasone 0.1 % cream Commonly known as: ELOCON May self administer to face twice daily as needed.   ORAL GEL ANESTHETIC MT Use as directed in the mouth or throat every 4 (four) hours as needed.   OYSTER SHELL PO Take 250 mg by mouth in the morning and at bedtime.   saccharomyces boulardii 250 MG capsule Commonly known as: FLORASTOR Take 250 mg by mouth. 8am   spironolactone 25 MG tablet Commonly known as: ALDACTONE Take 12.5 mg by mouth daily.   triamcinolone cream 0.5 % Commonly known as: KENALOG  Apply 1 application topically 2 (two) times daily. Apply to back for itching in the morning and evening.        Review of Systems  Constitutional:  Negative  for appetite change, fatigue and fever.  HENT:  Positive for hearing loss. Negative for congestion, rhinorrhea and voice change.        Hoarseness  Eyes:  Positive for visual disturbance.       Low vision  Respiratory:  Positive for cough. Negative for shortness of breath.   Cardiovascular:  Negative for leg swelling.  Gastrointestinal:  Negative for abdominal pain and constipation.  Genitourinary:  Negative for dysuria, frequency and urgency.  Musculoskeletal:  Positive for arthralgias and gait problem.  Skin:  Negative for color change.  Neurological:  Negative for speech difficulty, weakness and light-headedness.       Memory lapses  Psychiatric/Behavioral:  Positive for confusion. Negative for behavioral problems and sleep disturbance. The patient is not nervous/anxious.    Immunization History  Administered Date(s) Administered   Influenza Split 07/23/2013   Influenza Whole 07/05/2009, 06/26/2018   Influenza-Unspecified 07/29/2014, 06/14/2015, 07/11/2016, 07/15/2017, 06/27/2019, 07/05/2020, 07/13/2021   Moderna Sars-Covid-2 Vaccination 09/26/2019, 10/24/2019, 08/02/2020   PFIZER(Purple Top)SARS-COV-2 Vaccination 09/26/2019   PPD Test 06/17/2013   Pneumococcal Conjugate-13 07/22/2017   Pneumococcal Polysaccharide-23 05/05/2009   Td 05/05/2009, 07/16/2019   Unspecified SARS-COV-2 Vaccination 02/21/2021, 06/13/2021   Zoster, Live 01/09/2011   Pertinent  Health Maintenance Due  Topic Date Due   INFLUENZA VACCINE  Completed   DEXA SCAN  Completed   Fall Risk 07/29/2014 06/04/2017 06/10/2018 01/05/2020 03/10/2021  Falls in the past year? No Yes No - -  Was there an injury with Fall? - No - - -  Patient Fall Risk Level - - - High fall risk Moderate fall risk   Functional Status Survey:    Vitals:   09/19/21 1203  BP: 128/72  Pulse: 76  Resp: 20  Temp: 98.5 F (36.9 C)  SpO2: 98%  Weight: 145 lb 12.8 oz (66.1 kg)  Height: 5' 6"  (1.676 m)   Body mass index is  23.53 kg/m. Physical Exam HENT:     Head: Normocephalic.     Comments: No erythema or drainage in thorat    Nose: Nose normal.     Mouth/Throat:     Mouth: Mucous membranes are dry.  Eyes:     Extraocular Movements: Extraocular movements intact.     Conjunctiva/sclera: Conjunctivae normal.     Pupils: Pupils are equal, round, and reactive to light.     Comments: Legally blind  Cardiovascular:     Rate and Rhythm: Normal rate and regular rhythm.     Heart sounds: No murmur heard.    Comments: DP pulses R+L present.  Pulmonary:     Effort: Pulmonary effort is normal.     Breath sounds: No rales.  Abdominal:     General: Bowel sounds are normal.     Palpations: Abdomen is soft.     Tenderness: There is no abdominal tenderness.     Comments: Suprapubic and lower back area discomfort.   Musculoskeletal:     Cervical back: Normal range of motion and neck supple.     Right lower leg: No edema.     Left lower leg: No edema.  Skin:    General: Skin is warm and dry.  Neurological:     General: No focal deficit present.     Mental Status: She is alert. Mental status is  at baseline.     Gait: Gait abnormal.     Comments: Oriented to person, place.   Psychiatric:        Mood and Affect: Mood normal.        Behavior: Behavior normal.        Thought Content: Thought content normal.     Comments: The patient appears at her baseline mentation during my examination.     Labs reviewed: Recent Labs    03/15/21 0000 08/15/21 0000 09/07/21 0000  NA 140 139 137  K 4.4 4.5 4.3  CL 104 101 104  CO2 24* 27* 23*  BUN 22* 19 18  CREATININE 1.0 0.9 1.1  CALCIUM 9.7 9.6 9.1   Recent Labs    03/15/21 0000 08/15/21 0000 09/07/21 0000  AST 25 25 27   ALT 19 18 18   ALKPHOS 92 146* 121  ALBUMIN 4.3 4.1 7.2*   Recent Labs    03/15/21 0000 08/15/21 0000 09/07/21 0000  WBC 5.5 7.7 4.9  NEUTROABS 2,877.00 4,843.00 3,798.00  HGB 12.2 12.2 12.4  HCT 36 36 38  PLT 335 362 263    Lab Results  Component Value Date   TSH 1.62 08/15/2021   Lab Results  Component Value Date   HGBA1C 5.9 02/05/2017   Lab Results  Component Value Date   CHOL 247 (A) 11/21/2018   HDL 72 (A) 11/21/2018   LDLCALC 147 11/21/2018   LDLDIRECT 190.0 03/24/2012   TRIG 150 11/21/2018   CHOLHDL 4 03/24/2012    Significant Diagnostic Results in last 30 days:  No results found.  Assessment/Plan Confusion confusion, UA C/S negative for UTI 09/15/21, no noted focal neurological symptoms. Staff reported the patient's appetite has been decreased since onset of Flu 09/08/21. Update CBC/diff, CMP/eGFR, hold Furosemide, Spirolactone x 72hours.  09/19/21 Na 137, K 4.8, Bun 20, creat 0.99, eGFR 57, wbc 10.3, Hgb 11.9, plt 465, neutrophils 61.8%  Influenza Flu, resolved, completed Tamiflu.   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 Venous UKoreanegative for DVT. continue Tylenol, Norco  Essential hypertension Blood pressure is controlled  GERD (gastroesophageal reflux disease) stable, on Famotidine, Maalox qd/prn  Depression with anxiety on Amitriptyline, Librium, Fluphenazine, Lorazepam, TSH 2.76 10/06/20  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[place] functioning well in [facility], 03/29/21 MMSE 20/30, no clock drawing.   Constipation on prn Dulcolax po/suppository, Linzess, prn MOM, MiraLax bid/prn  Allergic rhinitis takes Claritin, Flonase, Mucinex         Osteoporosis stopped taking Prolia due to cost, ? Allergic to Fosamax.       Family/ staff Communication: plan of care reviewed with the patient and charge nurse.   Labs/tests ordered:   CBC/diff, CMP/eGFR  Time spend 40  minutes.

## 2021-09-19 NOTE — Assessment & Plan Note (Signed)
functioning well in [facility], 03/29/21 MMSE 20/30, no clock drawing.

## 2021-09-19 NOTE — Assessment & Plan Note (Signed)
Flu, resolved, completed Tamiflu.

## 2021-09-19 NOTE — Assessment & Plan Note (Signed)
on Amitriptyline, Librium, Fluphenazine, Lorazepam, TSH 2.76 10/06/20

## 2021-09-19 NOTE — Assessment & Plan Note (Signed)
stable, on Famotidine, Maalox qd/prn

## 2021-09-19 NOTE — Assessment & Plan Note (Signed)
takes Claritin, Flonase, Mucinex       

## 2021-09-19 NOTE — Assessment & Plan Note (Signed)
Left lower leg, ankle, foot pain, mostly in ankle and calf, swelling, warmth, healed, completed Naproxen 500mg  bid x7 days, 09/07/21 uric acid 6.4, wbc 4.9, Hgb 12.4, plt 263, neutrophils 49, CRP 4.8, Na 137, K 4.3, Bun 18, creat 1.05, X-ray L tibia/fibula, ankle, foot, no acute abnormality, noted heel spur. Venous US negative for DVT. continue Tylenol, Norco

## 2021-09-19 NOTE — Assessment & Plan Note (Signed)
Blood pressure is controlled

## 2021-09-19 NOTE — Assessment & Plan Note (Signed)
on prn Dulcolax po/suppository, Linzess, prn MOM, MiraLax bid/prn

## 2021-09-19 NOTE — Progress Notes (Addendum)
Subjective:   Jean [name] is a 80 y.o. female who presents for Medicare Annual (Subsequent) preventive examination at [facility].      Objective:    Today's Vitals   09/15/21 1551 09/19/21 1244  BP: 128/72   Pulse: 76   Resp: 20   Temp: 98.9 F (37.2 C)   SpO2: 100%   Weight: 145 lb 12.8 oz (66.1 kg)   Height: 5\' 6"  (1.676 m)   PainSc:  2    Body mass index is 23.53 kg/m.  Advanced Directives 09/19/2021 09/15/2021 09/08/2021 09/07/2021 08/11/2021 05/16/2021 03/10/2021  Does Patient Have a Medical Advance Directive? Yes Yes Yes Yes Yes Yes Yes  Type of Paramedic of [place];Out of facility DNR (pink MOST or yellow form) [place];Out of facility DNR (pink MOST or yellow form) [place];Out of facility DNR (pink MOST or yellow form) [place];Out of facility DNR (pink MOST or yellow form) [place];Out of facility DNR (pink MOST or yellow form) Out of facility DNR (pink MOST or yellow form);Healthcare Power of [employer] of facility DNR (pink MOST or yellow form)  Does patient want to make changes to medical advance directive? No - Patient declined No - Patient declined No - Patient declined No - Patient declined No - Patient declined No - Patient declined -  Copy of [place] in Chart? Yes - validated most recent copy scanned in chart (See row information) Yes - validated most recent copy scanned in chart (See row information) Yes - validated most recent copy scanned in chart (See row information) Yes - validated most recent copy scanned in chart (See row information) Yes - validated most recent copy scanned in chart (See row information) Yes - validated most recent copy scanned in chart (See row information) -  Pre-existing out of facility DNR order (yellow form or pink MOST form) Pink MOST/Yellow Form most recent copy in chart -  Physician notified to receive inpatient order - Pink MOST/Yellow Form most recent copy in chart - Physician notified to receive inpatient order Pink MOST/Yellow Form most recent copy in chart - Physician notified to receive inpatient order Pink MOST/Yellow Form most recent copy in chart - Physician notified to receive inpatient order Yellow form placed in chart (order not valid for inpatient use);Pink MOST form placed in chart (order not valid for inpatient use) -    Current Medications (verified) Outpatient Encounter Medications as of 09/15/2021  Medication Sig   Acetaminophen (TYLENOL) 325 MG CAPS Take 650 mg by mouth in the morning and at bedtime. AS NEEDED FOR PAIN *NOT TO EXCEED 3,000 MG IN 24HRS*   acetaminophen (TYLENOL) 325 MG tablet Take 650 mg by mouth every 8 (eight) hours as needed. 8am & 8pm AS NEEDED FOR PAIN *NOT TO EXCEED 3,000 MG IN 24HRS*   alum & mag hydroxide-simeth (MAALOX/MYLANTA) 200-200-20 MG/5ML suspension Take 30 mLs by mouth at bedtime. 9pm   alum & mag hydroxide-simeth (MAALOX/MYLANTA) 200-200-20 MG/5ML suspension Take 30 mLs by mouth every 4 (four) hours as needed for indigestion or heartburn.   amitriptyline (ELAVIL) 50 MG tablet Take 50 mg by mouth at bedtime. 9pm   ARTIFICIAL TEAR OP Apply 1 drop to eye 3 (three) times daily. Both Eyes at 8am, 2pm, and 9pm.   aspirin 81 MG EC tablet Take 81 mg by mouth daily. 9am   Benzocaine (ORAL GEL ANESTHETIC MT) Use as  directed in the mouth or throat every 4 (four) hours as needed.   betamethasone valerate (VALISONE) 0.1 % cream Apply 1 application topically as needed. Apply  thin amount to vulva twice daily.   bisacodyl (DULCOLAX) 10 MG suppository Place 10 mg rectally daily as needed for moderate constipation.   bisacodyl (DULCOLAX) 5 MG EC tablet Take 5 mg by mouth daily as needed for moderate constipation.   camphor-menthol (SARNA) lotion Apply topically. 0.5-0.5 % Three times a day as needed to mid back   carbamide  peroxide (DEBROX) 6.5 % OTIC solution Place 5 drops to affected ear every night for 3 days, then irrigate with bulb syringe. If not effective, notify MD.   chlordiazePOXIDE (LIBRIUM) 25 MG capsule Take one capsule by mouth at bedtime   diclofenac Sodium (VOLTAREN) 1 % GEL Apply 2 g topically 2 (two) times daily as needed. apply to lower back area   famotidine (PEPCID) 20 MG tablet Take 20 mg by mouth at bedtime. 8pm   fluPHENAZine (PROLIXIN) 1 MG tablet Take 1 mg by mouth at bedtime. 8pm.   fluticasone (FLONASE) 50 MCG/ACT nasal spray Place 1 spray into both nostrils 2 (two) times daily. 8am & 8pm.   furosemide (LASIX) 20 MG tablet Take 20 mg by mouth daily. 8am   guaiFENesin (MUCINEX) 600 MG 12 hr tablet Take 600 mg by mouth 2 (two) times daily as needed. (1) tab, oral, As Needed, Administer med as needed for cold/congestion.   HYDROcodone-acetaminophen (NORCO) 5-325 MG tablet Take 1 tablet by mouth daily.   hydrocortisone 2.5 % cream Apply 1 application topically 2 (two) times daily as needed. Apply to affected areas on face.   hydrocortisone cream 1 % Apply 1 application topically 2 (two) times daily. May self apply twice daily to Hemorrhoids.   hydrocortisone valerate cream (WESTCORT) 0.2 % Apply 1 application topically 2 (two) times daily as needed. Special Instructions: apply to brow with ketoconazole cream   ketoconazole (NIZORAL) 2 % cream Apply 1 application topically 2 (two) times daily as needed for irritation. Special Instructions: apply to brow along with hydrocortisone cream   Ketoconazole-Hydrocortisone 2-2.5 % CREA Apply 1 application topically 2 (two) times daily as needed. Apply to face for rash one time daily as needed.   linaclotide (LINZESS) 290 MCG CAPS capsule Take 290 mcg by mouth daily. Take by mouth 104minutes before breakfast on an empty stomach at 7:30am. Be sure to swallow whole   loratadine (CLARITIN) 10 MG tablet Take 10 mg by mouth in the morning and at bedtime.    LORazepam (ATIVAN) 0.5 MG tablet Take 1 tablet (0.5 mg total) by mouth 2 (two) times daily.   magnesium hydroxide (MILK OF MAGNESIA) 400 MG/5ML suspension Take 30 mLs by mouth daily as needed.    Menthol, Topical Analgesic, (BIOFREEZE) 4 % GEL Apply topically in the morning, at noon, in the evening, and at bedtime. Apply to knee   mometasone (ELOCON) 0.1 % cream May self administer to face twice daily as needed.   Multiple Vitamins-Minerals (CENTRUM SILVER PO) Take 1 tablet by mouth. 8am   OYSTER SHELL PO Take 250 mg by mouth in the morning and at bedtime.   polyethylene glycol (MIRALAX / GLYCOLAX) 17 g packet Take 17 g by mouth in the morning and at bedtime. Bedtime and Once a day-PRN   Polyethylene Glycol 3350 (MIRALAX PO) Take by mouth in the morning. Take  2 capful; Special Instructions: MIX WITH 8oz OF FLUIDS. ** RES. REQUESTED AM  DOSAGE GIVEN AT 6:30AM**   saccharomyces boulardii (FLORASTOR) 250 MG capsule Take 250 mg by mouth. 8am   spironolactone (ALDACTONE) 25 MG tablet Take 12.5 mg by mouth daily.   triamcinolone cream (KENALOG) 0.5 % Apply 1 application topically 2 (two) times daily. Apply to back for itching in the morning and evening.   No facility-administered encounter medications on file as of 09/15/2021.    Allergies (verified) Biaxin [clarithromycin], Clarithromycin, Doxycycline, Fosamax [alendronate sodium], Geodon [ziprasidone hydrochloride], Lipitor [atorvastatin calcium], Penicillins, Pravachol [pravastatin sodium], Statins, Sulfonamide derivatives, Zithromax [azithromycin], and Zocor [simvastatin]   History: Past Medical History:  Diagnosis Date   Allergy    Anemia    Anxiety    Asthma    CAD (coronary artery disease)    Depression    Diverticulosis    Dysfunction of eustachian tube    Elevated LFTs    Esophageal reflux    Esophageal stricture 2002   Hemorrhoids    History of rectal polyps    HLD (hyperlipidemia)    HTN (hypertension)    Hypertonicity of  bladder    Irritable bowel syndrome with constipation    Obesity    Optic neuritis    Osteoporosis    Peptic ulcer disease    Peripheral neuropathy    Trigeminal neuralgia    prior injury   Visual loss, bilateral    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, 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   Vitamin D deficiency    Past Surgical History:  Procedure Laterality Date   ABDOMINAL HYSTERECTOMY     ANAL RECTAL MANOMETRY N/A 07/05/2014   Procedure: ANO RECTAL MANOMETRY;  Surgeon: Leighton Ruff, MD;  Location: [facility];  Service: Endoscopy;  Laterality: N/A;   APPENDECTOMY     CAROTID ENDARTERECTOMY     COLONOSCOPY  2008, 2015   TONSILLECTOMY     UPPER GASTROINTESTINAL ENDOSCOPY  2002   Family History  Problem Relation Age of Onset   Alzheimer's disease Mother    Coronary artery disease Father    Heart attack Father    Heart disease Father    Anuerysm Father        AAA   Ovarian cancer Sister    Lung cancer Brother    Heart disease Brother    Anuerysm Brother        AAA   Diabetes Neg Hx    Colon cancer Neg Hx    Social History   Socioeconomic History   Marital status: Divorced    Spouse name: Not on file   Number of children: 2   Years of education: Not on file   Highest education level: Not on file  Occupational History   Occupation: retired    Fish farm manager: RETIRED  Tobacco Use   Smoking status: Never   Smokeless tobacco: Never  Vaping Use   Vaping Use: Never used  Substance and Sexual Activity   Alcohol use: No    Alcohol/week: 0.0 standard drinks   Drug use: No   Sexual activity: Never  Other Topics Concern   Not on file  Social History Narrative   HSG, business college   Married 59-24yrs/divorced   2 son-'60; 2 granddaughters   Work: Network engineer, travel clerk   [place] home in a studio apartment, moved to [place] 04/2013   Patient never smoked. Did have second hand smoke exposure childhood and work   Alcohol none   Walks with walker   Social  Determinants of Health   Financial Resource Strain:  Not on file  Food Insecurity: Not on file  Transportation Needs: Not on file  Physical Activity: Not on file  Stress: Not on file  Social Connections: Not on file    Tobacco Counseling Counseling given: Not Answered   Clinical Intake:  Pre-visit preparation completed: Yes  Pain : 0-10 Pain Score: 2  Pain Location: Knee Pain Orientation: Left, Right Pain Descriptors / Indicators: Aching, Discomfort, Dull, Nagging Pain Onset: More than a month ago Pain Frequency: Several days a week Pain Relieving Factors: rest, Tylenol, Hydrocodone. Effect of Pain on Daily Activities: limited walking  Pain Relieving Factors: rest, Tylenol, Hydrocodone.  BMI - recorded: 23.53 Nutritional Status: BMI of 19-24  Normal Nutritional Risks: None Diabetes: No  How often do you need to have someone help you when you read instructions, pamphlets, or other written materials from your doctor or pharmacy?: 1 - Never What is the last grade level you completed in school?: college  Diabetic?no  Interpreter Needed?: No  Information entered by :: Bexleigh Theriault Bretta Bang NP   Activities of Daily Living In your present state of health, do you have any difficulty performing the following activities: 09/19/2021  Hearing? N  Vision? Y  Difficulty concentrating or making decisions? Y  Walking or climbing stairs? Y  Dressing or bathing? Y  Doing errands, shopping? Y  Preparing Food and eating ? N  Using the Toilet? N  In the past six months, have you accidently leaked urine? N  Do you have problems with loss of bowel control? N  Managing your Medications? Y  Managing your Finances? Y  Housekeeping or managing your Housekeeping? Y  Some recent data might be hidden    Patient Care Team: [name], MD as PCP - General (Internal Medicine) [name], MD (Ophthalmology) [name], MD (Psychiatry) Steed Kanaan X, NP as Nurse Practitioner (Nurse  Practitioner) [name], MD as Consulting Physician (Orthopedic Surgery)  Indicate any recent Medical Services you may have received from other than [facility] providers in the past year (date may be approximate).     Assessment:   This is a routine wellness examination for [place].  Hearing/Vision screen No results found.  Dietary issues and exercise activities discussed: Current Exercise Habits: The patient does not participate in regular exercise at present, Exercise limited by: orthopedic condition(s)   Goals Addressed             This Visit's Progress    Maintain Mobility and Function       Evidence-based guidance:  Emphasize the importance of physical activity and aerobic exercise as included in treatment plan; assess barriers to adherence; consider patient's abilities and preferences.  Encourage gradual increase in activity or exercise instead of stopping if pain occurs.  Reinforce individual therapy exercise prescription, such as strengthening, stabilization and stretching programs.  Promote optimal body mechanics to stabilize the spine with lifting and functional activity.  Encourage activity and mobility modifications to facilitate optimal function, such as using a log roll for bed mobility or dressing from a seated position.  Reinforce individual adaptive equipment recommendations to limit excessive spinal movements, such as a Systems analyst.  Assess adequacy of sleep; encourage use of sleep hygiene techniques, such as bedtime routine; use of white noise; dark, cool bedroom; avoiding daytime naps, heavy meals or exercise before bedtime.  Promote positions and modification to optimize sleep and sexual activity; consider pillows or positioning devices to assist in maintaining neutral spine.  Explore options for applying ergonomic principles at  work and home, such as frequent position changes, using ergonomically designed equipment and working at optimal height.  Promote  modifications to increase comfort with driving such as lumbar support, optimizing seat and steering wheel position, using cruise control and taking frequent rest stops to stretch and walk.   Notes:        Depression Screen PHQ 2/9 Scores 06/10/2018 06/04/2017 07/29/2014  PHQ - 2 Score 0 0 0    Fall Risk Fall Risk  06/10/2018 06/04/2017 07/29/2014  Falls in the past year? No Yes No  Number falls in past yr: - 1 -  Injury with Fall? - No -    FALL RISK PREVENTION PERTAINING TO THE HOME:  Any stairs in or around the home? Yes  If so, are there any without handrails? No  Home free of loose throw rugs in walkways, pet beds, electrical cords, etc? Yes  Adequate lighting in your home to reduce risk of falls? Yes   ASSISTIVE DEVICES UTILIZED TO PREVENT FALLS:  Life alert? No  Use of a cane, walker or w/c? Yes  Grab bars in the bathroom? Yes  Shower chair or bench in shower? Yes  Elevated toilet seat or a handicapped toilet? Yes   TIMED UP AND GO:  Was the test performed? Yes .  Length of time to ambulate 10 feet: 18 sec.   Gait slow and steady with assistive device  Cognitive Function: MMSE - Mini Mental State Exam 09/21/2021 02/07/2018 06/04/2017  Not completed: - Unable to complete -  Orientation to time 4 5 4   Orientation to Place 2 5 5   Registration 3 3 3   Attention/ Calculation 3 5 5   Recall 2 3 2   Language- name 2 objects 2 2 2   Language- repeat 1 1 1   Language- follow 3 step command 3 3 3   Language- read & follow direction 0 0 0  Write a sentence 1 0 0  Copy design 0 0 0  Total score 21 27 25         Immunizations Immunization History  Administered Date(s) Administered   Influenza Split 07/23/2013   Influenza Whole 07/05/2009, 06/26/2018   Influenza-Unspecified 07/29/2014, 06/14/2015, 07/11/2016, 07/15/2017, 06/27/2019, 07/05/2020, 07/13/2021   Moderna Sars-Covid-2 Vaccination 09/26/2019, 10/24/2019, 08/02/2020   PFIZER(Purple Top)SARS-COV-2 Vaccination  09/26/2019   PPD Test 06/17/2013   Pneumococcal Conjugate-13 07/22/2017   Pneumococcal Polysaccharide-23 05/05/2009   Td 05/05/2009, 07/16/2019   Unspecified SARS-COV-2 Vaccination 02/21/2021, 06/13/2021   Zoster, Live 01/09/2011    TDAP status: Up to date  Flu Vaccine status: Up to date  Pneumococcal vaccine status: Up to date  Covid-19 vaccine status: Completed vaccines  Qualifies for Shingles Vaccine? Yes   Zostavax completed Yes   Shingrix Completed?: No.    Education has been provided regarding the importance of this vaccine. Patient has been advised to call insurance company to determine out of pocket expense if they have not yet received this vaccine. Advised may also receive vaccine at local pharmacy or Health Dept. Verbalized acceptance and understanding.  Screening Tests Health Maintenance  Topic Date Due   Zoster Vaccines- Shingrix (1 of 2) Never done   TETANUS/TDAP  07/15/2029   Pneumonia Vaccine 46+ Years old  Completed   INFLUENZA VACCINE  Completed   DEXA SCAN  Completed   COVID-19 Vaccine  Completed   HPV VACCINES  Aged Out    Health Maintenance  Health Maintenance Due  Topic Date Due   Zoster Vaccines- Shingrix (1 of 2) Never done  Colorectal cancer screening: No longer required.   Mammogram status: No longer required due to aged out.  Bone Density status: Ordered DEXA. Pt provided with contact info and advised to call to schedule appt.  Lung Cancer Screening: (Low Dose CT Chest recommended if Age 32-80 years, 30 pack-year currently smoking OR have quit w/in 15years.) does not qualify.   Additional Screening:  Hepatitis C Screening: does not qualify  Vision Screening: Recommended annual ophthalmology exams for early detection of glaucoma and other disorders of the eye. Is the patient up to date with their annual eye exam?  No  Who is the provider or what is the name of the office in which the patient attends annual eye exams? Referral to  Ophthalmology If pt is not established with a provider, would they like to be referred to a provider to establish care? No .   Dental Screening: Recommended annual dental exams for proper oral hygiene  Community Resource Referral / Chronic Care Management: CRR required this visit?  No   CCM required this visit?  No      Plan:     I have personally reviewed and noted the following in the patients chart:   Medical and social history Use of alcohol, tobacco or illicit drugs  Current medications and supplements including opioid prescriptions.  Functional ability and status Nutritional status Physical activity Advanced directives List of other physicians Hospitalizations, surgeries, and ER visits in previous 12 months Vitals Screenings to include cognitive, depression, and falls Referrals and appointments  In addition, I have reviewed and discussed with patient certain preventive protocols, quality metrics, and best practice recommendations. A written personalized care plan for preventive services as well as general preventive health recommendations were provided to patient.   Ophthalmology f/u DEXA, MMSE, Shingrix order provided.   Trude Cansler X Mohammad Granade, NP   09/21/2021

## 2021-09-19 NOTE — Assessment & Plan Note (Addendum)
confusion, UA C/S negative for UTI 09/15/21, no noted focal neurological symptoms. Staff reported the patient's appetite has been decreased since onset of Flu 09/08/21. Update CBC/diff, CMP/eGFR, hold Furosemide, Spirolactone x 72hours.  09/19/21 Na 137, K 4.8, Bun 20, creat 0.99, eGFR 57, wbc 10.3, Hgb 11.9, plt 465, neutrophils 61.8%

## 2021-09-19 NOTE — Assessment & Plan Note (Signed)
stopped taking Prolia due to cost, ? Allergic to Fosamax.

## 2021-09-20 ENCOUNTER — Encounter: Payer: Self-pay | Admitting: Nurse Practitioner

## 2021-09-27 ENCOUNTER — Other Ambulatory Visit: Payer: Self-pay | Admitting: Orthopedic Surgery

## 2021-09-27 DIAGNOSIS — M159 Polyosteoarthritis, unspecified: Secondary | ICD-10-CM

## 2021-09-27 MED ORDER — HYDROCODONE-ACETAMINOPHEN 5-325 MG PO TABS: 1.0000 | ORAL_TABLET | Freq: Every day | ORAL | 0 refills | Status: DC

## 2021-10-03 ENCOUNTER — Encounter: Payer: Self-pay | Admitting: Internal Medicine

## 2021-10-03 ENCOUNTER — Non-Acute Institutional Stay (SKILLED_NURSING_FACILITY): Payer: Medicare Other | Admitting: Internal Medicine

## 2021-10-03 DIAGNOSIS — I10 Essential (primary) hypertension: Secondary | ICD-10-CM | POA: Diagnosis not present

## 2021-10-03 DIAGNOSIS — M159 Polyosteoarthritis, unspecified: Secondary | ICD-10-CM | POA: Diagnosis not present

## 2021-10-03 DIAGNOSIS — F418 Other specified anxiety disorders: Secondary | ICD-10-CM | POA: Diagnosis not present

## 2021-10-03 DIAGNOSIS — F0390 Unspecified dementia without behavioral disturbance: Secondary | ICD-10-CM | POA: Diagnosis not present

## 2021-10-03 DIAGNOSIS — M810 Age-related osteoporosis without current pathological fracture: Secondary | ICD-10-CM

## 2021-10-03 DIAGNOSIS — K5909 Other constipation: Secondary | ICD-10-CM

## 2021-10-03 NOTE — Progress Notes (Signed)
Provider:  Veleta Miners MD Location:   [facility] Room Number: 101 Place of Service:  SNF ([phone])  PCP: [name], MD Patient Care Team: [name], MD as PCP - General (Internal Medicine) [name], MD (Ophthalmology) [name], MD (Psychiatry) Mast, Man X, NP as Nurse Practitioner (Nurse Practitioner) [name], MD as Consulting Physician (Orthopedic Surgery)  Extended Emergency Contact Information Primary Emergency Contact: [name], [place] Work Phone: [phone] Relation: Son Secondary Emergency Contact: [name] [place] Phone: [phone] Relation: Sister  Code Status: DNR Managed Care Goals of Care: Advanced Directive information Advanced Directives 10/03/2021  Does Patient Have a Medical Advance Directive? Yes  Type of Paramedic of [place];Out of facility DNR (pink MOST or yellow form)  Does patient want to make changes to medical advance directive? No - Patient declined  Copy of [place] in Chart? Yes - validated most recent copy scanned in chart (See row information)  Pre-existing out of facility DNR order (yellow form or pink MOST form) Pink MOST/Yellow Form most recent copy in chart - Physician notified to receive inpatient order      Chief Complaint  Patient presents with   New Admit To SNF    Admission to SNF    HPI: Patient is a 81 y.o. female seen today for admission to SNF  She has h/o Hypertension, Osteoarthritis, GERD , Constipation , Bilateral Visual Loss and  Anxiety with Depression Recent Worsening Cognition and tried to Leave the facility She was moved to Memory Unit  Per nurses stays very upset and wants to go back to her old room Very anxious with me also Pacing the floor No Other issues No falls Walk with no assist Wt Readings from Last 3 Encounters:  10/03/21 141 lb 9.6  oz (64.2 kg)  09/19/21 145 lb 12.8 oz (66.1 kg)  09/15/21 145 lb 12.8 oz (66.1 kg)   Weight stable  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, 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   Vitamin D deficiency    Past Surgical History:  Procedure Laterality Date   ABDOMINAL HYSTERECTOMY     ANAL RECTAL MANOMETRY N/A 07/05/2014   Procedure: ANO RECTAL MANOMETRY;  Surgeon: Leighton Ruff, MD;  Location: [facility];  Service: Endoscopy;  Laterality: N/A;   APPENDECTOMY     CAROTID ENDARTERECTOMY     COLONOSCOPY  2008, 2015   TONSILLECTOMY     UPPER GASTROINTESTINAL ENDOSCOPY  2002    reports that she has never smoked. She has never used smokeless tobacco. She reports that she does not drink  alcohol and does not use drugs. Social History   Socioeconomic History   Marital status: Divorced    Spouse name: Not on file   Number of children: 2   Years of education: Not on file   Highest education level: Not on file  Occupational History   Occupation: retired    Fish farm manager: RETIRED  Tobacco Use   Smoking status: Never   Smokeless tobacco: Never  Vaping Use   Vaping Use: Never used  Substance and Sexual Activity   Alcohol use: No    Alcohol/week: 0.0 standard drinks   Drug use: No   Sexual activity: Never  Other Topics Concern   Not on file  Social History Narrative   HSG, business college   Married  59-14yrs/divorced   2 son-'60; 2 granddaughters   Work: Network engineer, travel clerk   [place] home in a studio apartment, moved to [place] 04/2013   Patient never smoked. Did have second hand smoke exposure childhood and work   Alcohol none   Walks with walker   Social Determinants of Health   Financial Resource Strain: Not on file  Food Insecurity: Not on file  Transportation Needs: Not on file  Physical Activity: Not on file  Stress: Not on file  Social Connections: Not on file  Intimate Partner Violence: Not on file    Functional Status Survey:    Family History  Problem Relation Age of Onset   Alzheimer's disease Mother    Coronary artery disease Father    Heart attack Father    Heart disease Father    Anuerysm Father        AAA   Ovarian cancer Sister    Lung cancer Brother    Heart disease Brother    Anuerysm Brother        AAA   Diabetes Neg Hx    Colon cancer Neg Hx     Health Maintenance  Topic Date Due   Zoster Vaccines- Shingrix (1 of 2) Never done   TETANUS/TDAP  07/15/2029   Pneumonia Vaccine 33+ Years old  Completed   INFLUENZA VACCINE  Completed   DEXA SCAN  Completed   COVID-19 Vaccine  Completed   HPV VACCINES  Aged Out    Allergies  Allergen Reactions   Biaxin [Clarithromycin]    Clarithromycin    Doxycycline    Fosamax [Alendronate Sodium]    Geodon [Ziprasidone Hydrochloride]    Lipitor [Atorvastatin Calcium]    Penicillins    Pravachol [Pravastatin Sodium]    Statins    Sulfonamide Derivatives    Zithromax [Azithromycin]    Zocor [Simvastatin]     Allergies as of 10/03/2021       Reactions   Biaxin [clarithromycin]    Clarithromycin    Doxycycline    Fosamax [alendronate Sodium]    Geodon [ziprasidone Hydrochloride]    Lipitor [atorvastatin Calcium]    Penicillins    Pravachol [pravastatin Sodium]    Statins    Sulfonamide Derivatives    Zithromax [azithromycin]    Zocor [simvastatin]         Medication List         Accurate as of October 03, 2021  2:23 PM. If you have any questions, ask your nurse or doctor.          Tylenol 325 MG Caps Generic drug: Acetaminophen Take 650 mg by mouth in the morning and at bedtime. AS NEEDED FOR PAIN *NOT TO EXCEED 3,000 MG IN 24HRS*  acetaminophen 325 MG tablet Commonly known as: TYLENOL Take 650 mg by mouth every 8 (eight) hours as needed. 8am & 8pm AS NEEDED FOR PAIN *NOT TO EXCEED 3,000 MG IN 24HRS*   alum & mag hydroxide-simeth 573-220-25 MG/5ML suspension Commonly known as: MAALOX/MYLANTA Take 30 mLs by mouth at bedtime. 9pm   alum & mag hydroxide-simeth 200-200-20 MG/5ML suspension Commonly known as: MAALOX/MYLANTA Take 30 mLs by mouth every 4 (four) hours as needed for indigestion or heartburn.   amitriptyline 50 MG tablet Commonly known as: ELAVIL Take 50 mg by mouth at bedtime. 9pm   ARTIFICIAL TEAR OP Apply 1 drop to eye 3 (three) times daily. Both Eyes at 8am, 2pm, and 9pm.   aspirin 81 MG EC tablet Take 81 mg by mouth daily. 9am   betamethasone valerate 0.1 % cream Commonly known as: VALISONE Apply 1 application topically as needed. Apply  thin amount to vulva twice daily.   Biofreeze 4 % Gel Generic drug: Menthol (Topical Analgesic) Apply topically in the morning, at noon, in the evening, and at bedtime. Apply to knee   bisacodyl 10 MG suppository Commonly known as: DULCOLAX Place 10 mg rectally daily as needed for moderate constipation.   bisacodyl 5 MG EC tablet Commonly known as: DULCOLAX Take 5 mg by mouth daily as needed for moderate constipation.   camphor-menthol lotion Commonly known as: SARNA Apply topically. 0.5-0.5 % Three times a day as needed to mid back   carbamide peroxide 6.5 % OTIC solution Commonly known as: DEBROX Place 5 drops to affected ear every night for 3 days, then irrigate with bulb syringe. If not effective, notify MD.   CENTRUM SILVER PO Take 1 tablet by mouth. 8am   chlordiazePOXIDE 25  MG capsule Commonly known as: LIBRIUM Take one capsule by mouth at bedtime   diclofenac Sodium 1 % Gel Commonly known as: VOLTAREN Apply 2 g topically 2 (two) times daily as needed. apply to lower back area   famotidine 20 MG tablet Commonly known as: PEPCID Take 20 mg by mouth at bedtime. 8pm   fluPHENAZine 1 MG tablet Commonly known as: PROLIXIN Take 1 mg by mouth at bedtime. 8pm.   fluticasone 50 MCG/ACT nasal spray Commonly known as: FLONASE Place 1 spray into both nostrils 2 (two) times daily. 8am & 8pm.   furosemide 20 MG tablet Commonly known as: LASIX Take 20 mg by mouth daily. 8am   guaiFENesin 600 MG 12 hr tablet Commonly known as: MUCINEX Take 600 mg by mouth 2 (two) times daily as needed. (1) tab, oral, As Needed, Administer med as needed for cold/congestion.   HYDROcodone-acetaminophen 5-325 MG tablet Commonly known as: Norco Take 1 tablet by mouth daily.   hydrocortisone 2.5 % cream Apply 1 application topically 2 (two) times daily as needed. Apply to affected areas on face.   hydrocortisone cream 1 % Apply 1 application topically 2 (two) times daily. May self apply twice daily to Hemorrhoids.   hydrocortisone valerate cream 0.2 % Commonly known as: WESTCORT Apply 1 application topically 2 (two) times daily as needed. Special Instructions: apply to brow with ketoconazole cream   ketoconazole 2 % cream Commonly known as: NIZORAL Apply 1 application topically 2 (two) times daily as needed for irritation. Special Instructions: apply to brow along with hydrocortisone cream   Ketoconazole-Hydrocortisone 2-2.5 % Crea Apply 1 application topically 2 (two) times daily as needed. Apply to face for rash one time daily as needed.   Linzess [place]  capsule Generic drug: linaclotide Take 290 mcg by mouth daily. Take by mouth 77minutes before breakfast on an empty stomach at 7:30am. Be sure to swallow whole   loratadine 10 MG tablet Commonly known as:  CLARITIN Take 10 mg by mouth in the morning and at bedtime.   LORazepam 0.5 MG tablet Commonly known as: ATIVAN Take 1 tablet (0.5 mg total) by mouth 2 (two) times daily.   magnesium hydroxide 400 MG/5ML suspension Commonly known as: MILK OF MAGNESIA Take 30 mLs by mouth daily as needed.   MIRALAX PO Take by mouth in the morning. Take  2 capful; Special Instructions: MIX WITH 8oz OF FLUIDS. ** RES. REQUESTED AM DOSAGE GIVEN AT 6:30AM**   polyethylene glycol 17 g packet Commonly known as: MIRALAX / GLYCOLAX Take 17 g by mouth in the morning and at bedtime. Bedtime and Once a day-PRN   mometasone 0.1 % cream Commonly known as: ELOCON May self administer to face twice daily as needed.   ORAL GEL ANESTHETIC MT Use as directed in the mouth or throat every 4 (four) hours as needed.   OYSTER SHELL PO Take 250 mg by mouth in the morning and at bedtime.   saccharomyces boulardii 250 MG capsule Commonly known as: FLORASTOR Take 250 mg by mouth. 8am   spironolactone 25 MG tablet Commonly known as: ALDACTONE Take 12.5 mg by mouth daily.   triamcinolone cream 0.5 % Commonly known as: KENALOG Apply 1 application topically 2 (two) times daily. Apply to back for itching in the morning and evening.        Review of Systems  Constitutional:  Negative for activity change and appetite change.  HENT: Negative.    Respiratory:  Negative for cough and shortness of breath.   Cardiovascular:  Negative for leg swelling.  Gastrointestinal:  Negative for constipation.  Genitourinary: Negative.   Musculoskeletal:  Negative for arthralgias, gait problem and myalgias.  Skin: Negative.   Neurological:  Negative for dizziness and weakness.  Psychiatric/Behavioral:  Positive for confusion and dysphoric mood. Negative for sleep disturbance.    Vitals:   10/03/21 1229  BP: (!) 152/78  Pulse: 86  Resp: 18  Temp: 99.1 F (37.3 C)  SpO2: 94%  Weight: 141 lb 9.6 oz (64.2 kg)  Height: 5\' 5"   (1.651 m)   Body mass index is 23.56 kg/m. Physical Exam Vitals reviewed.  Constitutional:      Appearance: Normal appearance.  HENT:     Head: Normocephalic.     Nose: Nose normal.     Mouth/Throat:     Mouth: Mucous membranes are moist.     Pharynx: Oropharynx is clear.  Eyes:     Pupils: Pupils are equal, round, and reactive to light.  Cardiovascular:     Rate and Rhythm: Normal rate and regular rhythm.     Pulses: Normal pulses.     Heart sounds: Normal heart sounds. No murmur heard. Pulmonary:     Effort: Pulmonary effort is normal.     Breath sounds: Normal breath sounds.  Abdominal:     General: Abdomen is flat. Bowel sounds are normal.     Palpations: Abdomen is soft.  Musculoskeletal:        General: No swelling.     Cervical back: Neck supple.  Skin:    General: Skin is warm.  Neurological:     General: No focal deficit present.     Mental Status: She is alert.  Psychiatric:  Mood and Affect: Mood normal.        Thought Content: Thought content normal.    Labs reviewed: Basic Metabolic Panel: Recent Labs    08/15/21 0000 09/07/21 0000 09/19/21 0000  NA 139 137 137  K 4.5 4.3 4.8  CL 101 104 102  CO2 27* 23* 26*  BUN 19 18 20   CREATININE 0.9 1.1 1.0  CALCIUM 9.6 9.1 9.3   Liver Function Tests: Recent Labs    08/15/21 0000 09/07/21 0000 09/19/21 0000  AST 25 27 20   ALT 18 18 13   ALKPHOS 146* 121 131*  ALBUMIN 4.1 7.2* 3.8   No results for input(s): LIPASE, AMYLASE in the last 8760 hours. No results for input(s): AMMONIA in the last 8760 hours. CBC: Recent Labs    08/15/21 0000 09/07/21 0000 09/19/21 0000  WBC 7.7 4.9 10.3  NEUTROABS 4,843.00 3,798.00 6,365.00  HGB 12.2 12.4 11.9*  HCT 36 38 37  PLT 362 263 465*   Cardiac Enzymes: No results for input(s): CKTOTAL, CKMB, CKMBINDEX, TROPONINI in the last 8760 hours. BNP: Invalid input(s): POCBNP Lab Results  Component Value Date   HGBA1C 5.9 02/05/2017   Lab Results   Component Value Date   TSH 1.62 08/15/2021   Lab Results  Component Value Date   VITAMINB12 404 12/31/2010   Lab Results  Component Value Date   FOLATE 12.6 05/11/2010   No results found for: IRON, TIBC, FERRITIN  Imaging and Procedures obtained prior to SNF admission: CT Head Wo Contrast  Result Date: 03/10/2021 CLINICAL DATA:  Status post trip and fall with a blow to the head. Initial encounter. EXAM: CT HEAD WITHOUT CONTRAST CT CERVICAL SPINE WITHOUT CONTRAST TECHNIQUE: Multidetector CT imaging of the head and cervical spine was performed following the standard protocol without intravenous contrast. Multiplanar CT image reconstructions of the cervical spine were also generated. COMPARISON:  Head and cervical spine CT scans 03/31/2013. FINDINGS: CT HEAD FINDINGS Brain: No evidence of acute infarction, hemorrhage, hydrocephalus, extra-axial collection or mass lesion/mass effect. Vascular: No hyperdense vessel or unexpected calcification. Skull: Intact.  No focal lesion. Sinuses/Orbits: Status post cataract surgery.  Otherwise negative. Other: None. CT CERVICAL SPINE FINDINGS Alignment: Maintained. Skull base and vertebrae: No acute fracture. No primary bone lesion or focal pathologic process. Soft tissues and spinal canal: No prevertebral fluid or swelling. No visible canal hematoma. Disc levels: Loss of disc space height is most notable at C5-6 where there is vacuum disc phenomenon. Mild disc bulging C3-4 and C4-5 also noted. Upper chest: Negative. Other: None. IMPRESSION: Negative head CT. No acute abnormality cervical spine. Mild appearing cervical degenerative change. Electronically Signed   By: Inge Rise M.D.   On: 03/10/2021 14:35   CT Cervical Spine Wo Contrast  Result Date: 03/10/2021 CLINICAL DATA:  Status post trip and fall with a blow to the head. Initial encounter. EXAM: CT HEAD WITHOUT CONTRAST CT CERVICAL SPINE WITHOUT CONTRAST TECHNIQUE: Multidetector CT imaging of the  head and cervical spine was performed following the standard protocol without intravenous contrast. Multiplanar CT image reconstructions of the cervical spine were also generated. COMPARISON:  Head and cervical spine CT scans 03/31/2013. FINDINGS: CT HEAD FINDINGS Brain: No evidence of acute infarction, hemorrhage, hydrocephalus, extra-axial collection or mass lesion/mass effect. Vascular: No hyperdense vessel or unexpected calcification. Skull: Intact.  No focal lesion. Sinuses/Orbits: Status post cataract surgery.  Otherwise negative. Other: None. CT CERVICAL SPINE FINDINGS Alignment: Maintained. Skull base and vertebrae: No acute fracture. No primary bone lesion  or focal pathologic process. Soft tissues and spinal canal: No prevertebral fluid or swelling. No visible canal hematoma. Disc levels: Loss of disc space height is most notable at C5-6 where there is vacuum disc phenomenon. Mild disc bulging C3-4 and C4-5 also noted. Upper chest: Negative. Other: None. IMPRESSION: Negative head CT. No acute abnormality cervical spine. Mild appearing cervical degenerative change. Electronically Signed   By: Inge Rise M.D.   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[place]) Recent worsening CT scan in ED was negative for any acute changes Most likely Alzheimer MMSE 20/30 Can consider Namenda  Depression with anxiety ,on Amitriptyline, Librium, Fluphenazine, Lorazepam Needs follow up with Psych Has seen Dr Casimiro Needle before he has started her on these meds  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 On Low dose of Norco and Tylenol  Constipation Linzess Discontinued per son Just Continue Miralax  Age-related osteoporosis  T score -3.6 Did not tolerate Fosamax Refused Prolia Son does not want DEXA repeated   Family/ staff Communication:    Labs/tests ordered:

## 2021-10-11 ENCOUNTER — Other Ambulatory Visit: Payer: Self-pay | Admitting: Orthopedic Surgery

## 2021-10-11 DIAGNOSIS — F419 Anxiety disorder, unspecified: Secondary | ICD-10-CM

## 2021-10-11 MED ORDER — CHLORDIAZEPOXIDE HCL 25 MG PO CAPS: ORAL_CAPSULE | ORAL | 0 refills | Status: DC

## 2021-10-19 ENCOUNTER — Non-Acute Institutional Stay (SKILLED_NURSING_FACILITY): Payer: Medicare Other | Admitting: Nurse Practitioner

## 2021-10-19 ENCOUNTER — Encounter: Payer: Self-pay | Admitting: Nurse Practitioner

## 2021-10-19 DIAGNOSIS — J309 Allergic rhinitis, unspecified: Secondary | ICD-10-CM

## 2021-10-19 DIAGNOSIS — F418 Other specified anxiety disorders: Secondary | ICD-10-CM

## 2021-10-19 DIAGNOSIS — I10 Essential (primary) hypertension: Secondary | ICD-10-CM | POA: Diagnosis not present

## 2021-10-19 DIAGNOSIS — M159 Polyosteoarthritis, unspecified: Secondary | ICD-10-CM

## 2021-10-19 DIAGNOSIS — M8000XA Age-related osteoporosis with current pathological fracture, unspecified site, initial encounter for fracture: Secondary | ICD-10-CM

## 2021-10-19 DIAGNOSIS — K219 Gastro-esophageal reflux disease without esophagitis: Secondary | ICD-10-CM | POA: Diagnosis not present

## 2021-10-19 DIAGNOSIS — L309 Dermatitis, unspecified: Secondary | ICD-10-CM

## 2021-10-19 DIAGNOSIS — F0390 Unspecified dementia without behavioral disturbance: Secondary | ICD-10-CM

## 2021-10-19 DIAGNOSIS — K5909 Other constipation: Secondary | ICD-10-CM

## 2021-10-19 NOTE — Assessment & Plan Note (Signed)
stopped taking Prolia due to cost, ? Allergic to Fosamax.  her son declined DEXA 

## 2021-10-19 NOTE — Assessment & Plan Note (Signed)
takes Claritin, Flonase, Mucinex       

## 2021-10-19 NOTE — Progress Notes (Signed)
Location:   [facility] Room Number: 314 HFWYO of Service:  SNF (31) Provider: Lennie Odor Bufford Helms NP  [name], MD  Patient Care Team: [name], MD as PCP - General (Internal Medicine) [name], MD (Ophthalmology) [name], MD (Psychiatry) Ayahna Solazzo X, NP as Nurse Practitioner (Nurse Practitioner) [name], MD as Consulting Physician (Orthopedic Surgery)  Extended Emergency Contact Information Primary Emergency Contact: [name], [place] Work Phone: [phone] Relation: Son Secondary Emergency Contact: [name] [place] Phone: [phone] Relation: Sister  Code Status: DNR Goals of care: Advanced Directive information Advanced Directives 10/03/2021  Does Patient Have a Medical Advance Directive? Yes  Type of Paramedic of [place];Out of facility DNR (pink MOST or yellow form)  Does patient want to make changes to medical advance directive? No - Patient declined  Copy of [place] in Chart? Yes - validated most recent copy scanned in chart (See row information)  Pre-existing out of facility DNR order (yellow form or pink MOST form) Pink MOST/Yellow Form most recent copy in chart - Physician notified to receive inpatient order     Chief Complaint  Patient presents with   Acute Visit    Flare up left facial rash, itching, burning.     HPI:  Pt is a 81 y.o. female seen today for an acute visit for flare up right face itching, burning, flaky rash, saw Dermatology, Mometasone cream effective in the past.   HTN, on Furosemide, Spironolactone, ASA 81mg  qd. Bun/creat 20/1.0 09/19/21             OA on Tylenol, Norco             GERD, stable, on Famotidine, Maalox qd/prn, Hgb 11.9 09/19/21             Depression/anxiety, on Amitriptyline, Librium, Fluphenazine, Lorazepam, TSH 1.62 08/15/21             Dementia, supportive care in  memory care unit [facility] since Flu 08/2021. CT head was negative for acute process. May consider Memantine.              Chronic constipation, on prn Dulcolax po/suppository, prn MOM, MiraLax bid/prn             Allergic rhinitis, takes Claritin, Flonase, Mucinex        OP, stopped taking Prolia due to cost, ? Allergic to Fosamax.  her son declined DEXA                 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, 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  Vitamin D deficiency    Past Surgical History:  Procedure Laterality Date   ABDOMINAL HYSTERECTOMY     ANAL RECTAL MANOMETRY N/A 07/05/2014   Procedure: ANO RECTAL MANOMETRY;  Surgeon: Leighton Ruff, MD;  Location: [facility];  Service: Endoscopy;  Laterality: N/A;   APPENDECTOMY     CAROTID ENDARTERECTOMY     COLONOSCOPY  2008, 2015   TONSILLECTOMY     UPPER GASTROINTESTINAL ENDOSCOPY  2002    Allergies  Allergen Reactions   Biaxin [Clarithromycin]    Clarithromycin    Doxycycline    Fosamax [Alendronate Sodium]    Geodon [Ziprasidone Hydrochloride]    Lipitor [Atorvastatin Calcium]    Penicillins    Pravachol [Pravastatin Sodium]    Statins    Sulfonamide Derivatives    Zithromax [Azithromycin]    Zocor [Simvastatin]     Allergies as of 10/19/2021       Reactions   Biaxin [clarithromycin]    Clarithromycin     Doxycycline    Fosamax [alendronate Sodium]    Geodon [ziprasidone Hydrochloride]    Lipitor [atorvastatin Calcium]    Penicillins    Pravachol [pravastatin Sodium]    Statins    Sulfonamide Derivatives    Zithromax [azithromycin]    Zocor [simvastatin]         Medication List        Accurate as of October 19, 2021 11:59 PM. If you have any questions, ask your nurse or doctor.          Tylenol 325 MG Caps Generic drug: Acetaminophen Take 650 mg by mouth in the morning and at bedtime. AS NEEDED FOR PAIN *NOT TO EXCEED 3,000 MG IN 24HRS*   acetaminophen 325 MG tablet Commonly known as: TYLENOL Take 650 mg by mouth every 8 (eight) hours as needed. 8am & 8pm AS NEEDED FOR PAIN *NOT TO EXCEED 3,000 MG IN 24HRS*   alum & mag hydroxide-simeth 867-619-50 MG/5ML suspension Commonly known as: MAALOX/MYLANTA Take 30 mLs by mouth at bedtime. 9pm   alum & mag hydroxide-simeth 200-200-20 MG/5ML suspension Commonly known as: MAALOX/MYLANTA Take 30 mLs by mouth every 4 (four) hours as needed for indigestion or heartburn.   amitriptyline 50 MG tablet Commonly known as: ELAVIL Take 50 mg by mouth at bedtime. 9pm   ARTIFICIAL TEAR OP Apply 1 drop to eye 3 (three) times daily. Both Eyes at 8am, 2pm, and 9pm.   aspirin 81 MG EC tablet Take 81 mg by mouth daily. 9am   betamethasone valerate 0.1 % cream Commonly known as: VALISONE Apply 1 application topically as needed. Apply  thin amount to vulva twice daily.   Biofreeze 4 % Gel Generic drug: Menthol (Topical Analgesic) Apply topically in the morning, at noon, in the evening, and at bedtime. Apply to knee   bisacodyl 10 MG suppository Commonly known as: DULCOLAX Place 10 mg rectally daily as needed for moderate constipation.   bisacodyl 5 MG EC tablet Commonly known as: DULCOLAX Take 5 mg by mouth daily as needed for moderate constipation.   camphor-menthol lotion Commonly known as: SARNA Apply topically. 0.5-0.5 %  Three times a day as needed to mid back   carbamide peroxide 6.5 % OTIC solution Commonly known as: DEBROX Place 5 drops to affected ear every night for 3 days, then irrigate with bulb syringe. If not effective, notify MD.   CENTRUM SILVER PO Take 1 tablet by mouth. 8am   chlordiazePOXIDE 25 MG capsule Commonly known as: LIBRIUM Take one capsule  by mouth at bedtime   diclofenac Sodium 1 % Gel Commonly known as: VOLTAREN Apply 2 g topically 2 (two) times daily as needed. apply to lower back area   famotidine 20 MG tablet Commonly known as: PEPCID Take 20 mg by mouth at bedtime. 8pm   fluPHENAZine 1 MG tablet Commonly known as: PROLIXIN Take 1 mg by mouth at bedtime. 8pm.   fluticasone 50 MCG/ACT nasal spray Commonly known as: FLONASE Place 1 spray into both nostrils 2 (two) times daily. 8am & 8pm.   furosemide 20 MG tablet Commonly known as: LASIX Take 20 mg by mouth daily. 8am   guaiFENesin 600 MG 12 hr tablet Commonly known as: MUCINEX Take 600 mg by mouth 2 (two) times daily as needed. (1) tab, oral, As Needed, Administer med as needed for cold/congestion.   HYDROcodone-acetaminophen 5-325 MG tablet Commonly known as: Norco Take 1 tablet by mouth daily.   hydrocortisone 2.5 % cream Apply 1 application topically 2 (two) times daily as needed. Apply to affected areas on face.   hydrocortisone cream 1 % Apply 1 application topically 2 (two) times daily. May self apply twice daily to Hemorrhoids.   hydrocortisone valerate cream 0.2 % Commonly known as: WESTCORT Apply 1 application topically 2 (two) times daily as needed. Special Instructions: apply to brow with ketoconazole cream   ketoconazole 2 % cream Commonly known as: NIZORAL Apply 1 application topically 2 (two) times daily as needed for irritation. Special Instructions: apply to brow along with hydrocortisone cream   Ketoconazole-Hydrocortisone 2-2.5 % Crea Apply 1 application topically 2 (two) times daily  as needed. Apply to face for rash one time daily as needed.   Linzess 290 MCG Caps capsule Generic drug: linaclotide Take 290 mcg by mouth daily. Take by mouth 52minutes before breakfast on an empty stomach at 7:30am. Be sure to swallow whole   loratadine 10 MG tablet Commonly known as: CLARITIN Take 10 mg by mouth in the morning and at bedtime.   LORazepam 0.5 MG tablet Commonly known as: ATIVAN Take 1 tablet (0.5 mg total) by mouth 2 (two) times daily.   magnesium hydroxide 400 MG/5ML suspension Commonly known as: MILK OF MAGNESIA Take 30 mLs by mouth daily as needed.   MIRALAX PO Take by mouth in the morning. Take  2 capful; Special Instructions: MIX WITH 8oz OF FLUIDS. ** RES. REQUESTED AM DOSAGE GIVEN AT 6:30AM**   polyethylene glycol 17 g packet Commonly known as: MIRALAX / GLYCOLAX Take 17 g by mouth in the morning and at bedtime. Bedtime and Once a day-PRN   mometasone 0.1 % cream Commonly known as: ELOCON May self administer to face twice daily as needed.   ORAL GEL ANESTHETIC MT Use as directed in the mouth or throat every 4 (four) hours as needed.   OYSTER SHELL PO Take 250 mg by mouth in the morning and at bedtime.   saccharomyces boulardii 250 MG capsule Commonly known as: FLORASTOR Take 250 mg by mouth. 8am   spironolactone 25 MG tablet Commonly known as: ALDACTONE Take 12.5 mg by mouth daily.   triamcinolone cream 0.5 % Commonly known as: KENALOG Apply 1 application topically 2 (two) times daily. Apply to back for itching in the morning and evening.        Review of Systems  Unable to perform ROS: Dementia   Immunization History  Administered Date(s) Administered   Influenza Split 07/23/2013   Influenza Whole 07/05/2009, 06/26/2018   Influenza-Unspecified 07/29/2014, 06/14/2015, 07/11/2016, 07/15/2017,  06/27/2019, 07/05/2020, 07/13/2021   Moderna Sars-Covid-2 Vaccination 09/26/2019, 10/24/2019, 08/02/2020   PFIZER(Purple Top)SARS-COV-2  Vaccination 09/26/2019   PPD Test 06/17/2013   Pneumococcal Conjugate-13 07/22/2017   Pneumococcal Polysaccharide-23 05/05/2009   Td 05/05/2009, 07/16/2019   Unspecified SARS-COV-2 Vaccination 02/21/2021, 06/13/2021   Zoster, Live 01/09/2011   Pertinent  Health Maintenance Due  Topic Date Due   INFLUENZA VACCINE  Completed   DEXA SCAN  Completed   Fall Risk 07/29/2014 06/04/2017 06/10/2018 01/05/2020 03/10/2021  Falls in the past year? No Yes No - -  Was there an injury with Fall? - No - - -  Patient Fall Risk Level - - - High fall risk Moderate fall risk   Functional Status Survey:    Vitals:   10/19/21 1328  BP: (!) 148/57  Pulse: 75  Resp: 20  Temp: 97.6 F (36.4 C)  SpO2: 99%   There is no height or weight on file to calculate BMI. Physical Exam Constitutional:      Appearance: Normal appearance.  HENT:     Head: Normocephalic.     Comments: No erythema or drainage in thorat    Nose: Nose normal.     Mouth/Throat:     Mouth: Mucous membranes are moist.  Eyes:     Extraocular Movements: Extraocular movements intact.     Conjunctiva/sclera: Conjunctivae normal.     Pupils: Pupils are equal, round, and reactive to light.     Comments: Legally blind  Cardiovascular:     Rate and Rhythm: Normal rate and regular rhythm.     Heart sounds: No murmur heard.    Comments: DP pulses R+L present.  Pulmonary:     Effort: Pulmonary effort is normal.     Breath sounds: No rales.  Abdominal:     General: Bowel sounds are normal.     Palpations: Abdomen is soft.     Tenderness: There is no abdominal tenderness.     Comments: Suprapubic and lower back area discomfort.   Musculoskeletal:     Cervical back: Normal range of motion and neck supple.     Right lower leg: No edema.     Left lower leg: No edema.  Skin:    General: Skin is warm and dry.     Findings: Erythema and rash present.     Comments: Left face, right temporal area flaky, itching/burning redness.    Neurological:     General: No focal deficit present.     Mental Status: She is alert. Mental status is at baseline.     Gait: Gait abnormal.     Comments: Oriented to person, place.   Psychiatric:        Mood and Affect: Mood normal.        Behavior: Behavior normal.        Thought Content: Thought content normal.     Comments: The patient appears at her baseline mentation during my examination.     Labs reviewed: Recent Labs    08/15/21 0000 09/07/21 0000 09/19/21 0000  NA 139 137 137  K 4.5 4.3 4.8  CL 101 104 102  CO2 27* 23* 26*  BUN 19 18 20   CREATININE 0.9 1.1 1.0  CALCIUM 9.6 9.1 9.3   Recent Labs    08/15/21 0000 09/07/21 0000 09/19/21 0000  AST 25 27 20   ALT 18 18 13   ALKPHOS 146* 121 131*  ALBUMIN 4.1 7.2* 3.8   Recent Labs    08/15/21 0000 09/07/21 0000  09/19/21 0000  WBC 7.7 4.9 10.3  NEUTROABS 4,843.00 3,798.00 6,365.00  HGB 12.2 12.4 11.9*  HCT 36 38 37  PLT 362 263 465*   Lab Results  Component Value Date   TSH 1.62 08/15/2021   Lab Results  Component Value Date   HGBA1C 5.9 02/05/2017   Lab Results  Component Value Date   CHOL 247 (A) 11/21/2018   HDL 72 (A) 11/21/2018   LDLCALC 147 11/21/2018   LDLDIRECT 190.0 03/24/2012   TRIG 150 11/21/2018   CHOLHDL 4 03/24/2012    Significant Diagnostic Results in last 30 days:  No results found.  Assessment/Plan: Dermatitis flare up right face itching, burning, flaky rash, saw Dermatology, Mometasone cream effective in the past. Resume Mometasone cream after cleanse face with mild soap and water x 10 days   Essential hypertension Blood pressure is controlled, on Furosemide, Spironolactone, ASA 81mg  qd. 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   GERD (gastroesophageal reflux disease)  stable, on Famotidine, Maalox qd/prn, Hgb 11.9 09/19/21  Depression with anxiety Stable, f/u Psych, on  Amitriptyline, Librium, Fluphenazine, Lorazepam, TSH 1.62 08/15/21  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[place]) supportive care in memory care unit [facility] since Flu 08/2021. CT head was negative for acute process. May consider Memantine.   Constipation on prn Dulcolax po/suppository, prn MOM, MiraLax bid/prn  Allergic rhinitis takes Claritin, Flonase, Mucinex         Osteoporosis stopped taking Prolia due to cost, ? Allergic to Fosamax.  her son declined DEXA    Family/ staff Communication: plan of care reviewed with the patient and charge nurse.   Labs/tests ordered:  none  Time spend 35 minutes.

## 2021-10-19 NOTE — Assessment & Plan Note (Signed)
supportive care in memory care unit [facility] since Flu 08/2021. CT head was negative for acute process. May consider Memantine.

## 2021-10-19 NOTE — Assessment & Plan Note (Signed)
on prn Dulcolax po/suppository, prn MOM, MiraLax bid/prn

## 2021-10-19 NOTE — Assessment & Plan Note (Signed)
Blood pressure is controlled, on Furosemide, Spironolactone, ASA 81mg qd. Bun/creat 20/1.0 09/19/21 

## 2021-10-19 NOTE — Assessment & Plan Note (Signed)
flare up right face itching, burning, flaky rash, saw Dermatology, Mometasone cream effective in the past. Resume Mometasone cream after cleanse face with mild soap and water x 10 days

## 2021-10-19 NOTE — Assessment & Plan Note (Signed)
Stable, f/u Psych, on Amitriptyline, Librium, Fluphenazine, Lorazepam, TSH 1.62 08/15/21

## 2021-10-19 NOTE — Assessment & Plan Note (Signed)
on Tylenol, Norco, ambulates with walker.  

## 2021-10-19 NOTE — Assessment & Plan Note (Signed)
stable, on Famotidine, Maalox qd/prn, Hgb 11.9 09/19/21 

## 2021-10-23 ENCOUNTER — Encounter: Payer: Self-pay | Admitting: Nurse Practitioner

## 2021-11-06 ENCOUNTER — Other Ambulatory Visit: Payer: Self-pay | Admitting: *Deleted

## 2021-11-06 ENCOUNTER — Encounter: Payer: Self-pay | Admitting: Nurse Practitioner

## 2021-11-06 ENCOUNTER — Non-Acute Institutional Stay (SKILLED_NURSING_FACILITY): Payer: Medicare Other | Admitting: Nurse Practitioner

## 2021-11-06 DIAGNOSIS — F419 Anxiety disorder, unspecified: Secondary | ICD-10-CM

## 2021-11-06 DIAGNOSIS — K219 Gastro-esophageal reflux disease without esophagitis: Secondary | ICD-10-CM

## 2021-11-06 DIAGNOSIS — J309 Allergic rhinitis, unspecified: Secondary | ICD-10-CM

## 2021-11-06 DIAGNOSIS — M159 Polyosteoarthritis, unspecified: Secondary | ICD-10-CM

## 2021-11-06 DIAGNOSIS — I10 Essential (primary) hypertension: Secondary | ICD-10-CM

## 2021-11-06 DIAGNOSIS — F0390 Unspecified dementia without behavioral disturbance: Secondary | ICD-10-CM

## 2021-11-06 DIAGNOSIS — F418 Other specified anxiety disorders: Secondary | ICD-10-CM

## 2021-11-06 DIAGNOSIS — K5909 Other constipation: Secondary | ICD-10-CM

## 2021-11-06 DIAGNOSIS — M7989 Other specified soft tissue disorders: Secondary | ICD-10-CM

## 2021-11-06 DIAGNOSIS — M8000XA Age-related osteoporosis with current pathological fracture, unspecified site, initial encounter for fracture: Secondary | ICD-10-CM

## 2021-11-06 MED ORDER — LORAZEPAM 0.5 MG PO TABS: 0.5000 mg | ORAL_TABLET | Freq: Two times a day (BID) | ORAL | 0 refills | Status: DC

## 2021-11-06 NOTE — Assessment & Plan Note (Signed)
takes Claritin, Flonase, Mucinex       

## 2021-11-06 NOTE — Assessment & Plan Note (Signed)
A pinto bean sized a cyst like lump palpated near the clitoris, no pain, redness, swelling, or warmth. Will observe, apply warm compress 45min/each qsh x 1week.

## 2021-11-06 NOTE — Assessment & Plan Note (Signed)
Pain is controlled, on Tylenol, Norco

## 2021-11-06 NOTE — Assessment & Plan Note (Signed)
supportive care in memory care unit [facility] since Flu 08/2021. CT head was negative for acute process. May consider Memantine.

## 2021-11-06 NOTE — Assessment & Plan Note (Addendum)
Dbp 93, denied headache, chest pain/pressures, SOB, continue Furosemide , Spironolactone, ASA 81mg  qd. Bun/creat 20/1.0 09/19/21. Observe Bp

## 2021-11-06 NOTE — Assessment & Plan Note (Signed)
Stable,  on Amitriptyline, Librium, Fluphenazine, Lorazepam, TSH 1.62 08/15/21

## 2021-11-06 NOTE — Assessment & Plan Note (Signed)
stable, on Famotidine, Maalox qd/prn, Hgb 11.9 09/19/21 

## 2021-11-06 NOTE — Assessment & Plan Note (Addendum)
Stable, on prn Dulcolax po/suppository, prn MOM, MiraLax bid/prn 

## 2021-11-06 NOTE — Assessment & Plan Note (Signed)
stopped taking Prolia due to cost, ? Allergic to Fosamax.  her son declined DEXA 

## 2021-11-06 NOTE — Progress Notes (Signed)
Location:  [facility] Room Number: N101/A Place of Service:  SNF (31) Provider:  Burech Mcfarland X, NP  Patient Care Team: [name], MD as PCP - General (Internal Medicine) [name], MD (Ophthalmology) [name], MD (Psychiatry) Kelen Laura X, NP as Nurse Practitioner (Nurse Practitioner) [name], MD as Consulting Physician (Orthopedic Surgery)  Extended Emergency Contact Information Primary Emergency Contact: [name], [place] of [place] Work Phone: [phone] Relation: Son Secondary Emergency Contact: [name] [place] Phone: [phone] Relation: Sister  Code Status:  DNR Goals of care: Advanced Directive information Advanced Directives 11/06/2021  Does Patient Have a Medical Advance Directive? Yes  Type of Paramedic of [place];Out of facility DNR (pink MOST or yellow form)  Does patient want to make changes to medical advance directive? No - Patient declined  Copy of [place] in Chart? Yes - validated most recent copy scanned in chart (See row information)  Pre-existing out of facility DNR order (yellow form or pink MOST form) Pink MOST/Yellow Form most recent copy in chart - Physician notified to receive inpatient order     Chief Complaint  Patient presents with   Acute Visit    Vaginal area discomfort    HPI:  Pt is a 81 y.o. female seen today for an acute visit for staff reported the patient's c/o discomfort to her vagina 11/04/21, but the patient stated her vaginal discomfort is resolved. Denied dysuria, urinary frequency or urgency, no abd pain noted, she is afebrile. There is cyst like finding in clitoris area, the patient denied pain when palpated.    Face rash, subsided, on Mometasone cream, saw Dermatology,              HTN, on Furosemide, Spironolactone, ASA 81mg  qd. Bun/creat 20/1.0 09/19/21             OA on  Tylenol, Norco             GERD, stable, on Famotidine, Maalox qd/prn, Hgb 11.9 09/19/21             Depression/anxiety, on Amitriptyline, Librium, Fluphenazine, Lorazepam, TSH 1.62 08/15/21             Dementia, supportive care in memory care unit [facility] since Flu 08/2021. CT head was negative for acute process. May consider Memantine.              Chronic constipation, on prn Dulcolax po/suppository, prn MOM, MiraLax bid/prn             Allergic rhinitis, takes Claritin, Flonase, Mucinex        OP, stopped taking Prolia due to cost, ? Allergic to Fosamax.  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, 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   Vitamin D deficiency    Past Surgical History:  Procedure Laterality Date   ABDOMINAL HYSTERECTOMY     ANAL RECTAL MANOMETRY N/A 07/05/2014   Procedure: ANO RECTAL MANOMETRY;  Surgeon: Leighton Ruff, MD;  Location: [facility];  Service: Endoscopy;  Laterality: N/A;   APPENDECTOMY     CAROTID ENDARTERECTOMY     COLONOSCOPY  2008, 2015   TONSILLECTOMY     UPPER GASTROINTESTINAL ENDOSCOPY  2002    Allergies  Allergen Reactions   Biaxin [Clarithromycin]    Clarithromycin    Doxycycline    Fosamax [Alendronate Sodium]    Geodon [Ziprasidone Hydrochloride]    Lipitor [Atorvastatin Calcium]    Penicillins    Pravachol  [Pravastatin Sodium]    Statins    Sulfonamide Derivatives    Zithromax [Azithromycin]    Zocor [Simvastatin]     Outpatient Encounter Medications as of 11/06/2021  Medication Sig   Acetaminophen (TYLENOL) 325 MG CAPS Take 650 mg by mouth in the morning and at bedtime. AS NEEDED FOR PAIN *NOT TO EXCEED 3,000 MG IN 24HRS*   acetaminophen (TYLENOL) 325 MG tablet Take 650 mg by mouth every 8 (eight) hours as needed. 8am & 8pm AS NEEDED FOR PAIN *NOT TO EXCEED 3,000 MG IN 24HRS*   alum & mag hydroxide-simeth (MAALOX/MYLANTA) 200-200-20 MG/5ML suspension Take 30 mLs by mouth at bedtime. 9pm   alum & mag hydroxide-simeth (MAALOX/MYLANTA) 200-200-20 MG/5ML suspension Take 30 mLs by mouth every 4 (four) hours as needed for indigestion or heartburn.   amitriptyline (ELAVIL) 50 MG tablet Take 50 mg by mouth at bedtime. 9pm   ARTIFICIAL TEAR OP Apply 1 drop to eye 3 (three) times daily. Both Eyes at 8am, 2pm, and 9pm.   aspirin 81 MG EC tablet Take 81 mg by mouth daily. 9am   Benzocaine (ORAL GEL ANESTHETIC MT) Use as directed in the mouth or throat every 4 (four) hours as needed.   bisacodyl (DULCOLAX) 10 MG suppository Place 10 mg rectally daily as needed for moderate constipation.   bisacodyl (DULCOLAX) 5 MG EC tablet Take 5 mg by mouth daily as needed for moderate constipation.   diclofenac Sodium (VOLTAREN) 1 % GEL Apply 2 g topically 2 (two) times daily as needed. apply to lower back area   famotidine (PEPCID) 20 MG tablet Take 20 mg by mouth at bedtime. 8pm   fluPHENAZine (PROLIXIN) 1 MG tablet Take 1 mg by mouth at bedtime. 8pm.   furosemide (LASIX) 20 MG tablet Take 20 mg by mouth daily. 8am   guaiFENesin (MUCINEX) 600 MG 12 hr tablet Take 600 mg by mouth 2 (two) times daily as needed. (1) tab, oral, As Needed, Administer med as needed for cold/congestion.   HYDROcodone-acetaminophen (NORCO) 5-325 MG tablet Take 1 tablet by mouth daily.   loratadine (CLARITIN) 10 MG tablet  Take 10 mg by mouth in the morning and at bedtime.   LORazepam (ATIVAN) 0.5 MG tablet Take 1 tablet (0.5 mg total) by mouth 2 (two) times daily.   magnesium hydroxide (MILK OF MAGNESIA) 400 MG/5ML suspension Take 30 mLs by mouth daily as needed.    Multiple Vitamins-Minerals (CENTRUM SILVER PO) Take 1 tablet by mouth. 8am   OYSTER SHELL PO Take 250 mg by mouth in the morning and at bedtime.   polyethylene glycol (  MIRALAX / GLYCOLAX) 17 g packet Take 17 g by mouth in the morning and at bedtime. Bedtime and Once a day-PRN   Polyethylene Glycol 3350 (MIRALAX PO) Take by mouth in the morning. Take  2 capful; Special Instructions: MIX WITH 8oz OF FLUIDS. ** RES. REQUESTED AM DOSAGE GIVEN AT 6:30AM**   saccharomyces boulardii (FLORASTOR) 250 MG capsule Take 250 mg by mouth. 8am   spironolactone (ALDACTONE) 25 MG tablet Take 12.5 mg by mouth daily.   [DISCONTINUED] chlordiazePOXIDE (LIBRIUM) 25 MG capsule Take one capsule by mouth at bedtime   [DISCONTINUED] betamethasone valerate (VALISONE) 0.1 % cream Apply 1 application topically as needed. Apply  thin amount to vulva twice daily. (Patient not taking: Reported on 11/06/2021)   [DISCONTINUED] camphor-menthol (SARNA) lotion Apply topically. 0.5-0.5 % Three times a day as needed to mid back (Patient not taking: Reported on 11/06/2021)   [DISCONTINUED] carbamide peroxide (DEBROX) 6.5 % OTIC solution Place 5 drops to affected ear every night for 3 days, then irrigate with bulb syringe. If not effective, notify MD. (Patient not taking: Reported on 11/06/2021)   [DISCONTINUED] fluticasone (FLONASE) 50 MCG/ACT nasal spray Place 1 spray into both nostrils 2 (two) times daily. 8am & 8pm. (Patient not taking: Reported on 11/06/2021)   [DISCONTINUED] hydrocortisone 2.5 % cream Apply 1 application topically 2 (two) times daily as needed. Apply to affected areas on face. (Patient not taking: Reported on 11/06/2021)   [DISCONTINUED] hydrocortisone cream 1 %  Apply 1 application topically 2 (two) times daily. May self apply twice daily to Hemorrhoids. (Patient not taking: Reported on 11/06/2021)   [DISCONTINUED] hydrocortisone valerate cream (WESTCORT) 0.2 % Apply 1 application topically 2 (two) times daily as needed. Special Instructions: apply to brow with ketoconazole cream (Patient not taking: Reported on 11/06/2021)   [DISCONTINUED] ketoconazole (NIZORAL) 2 % cream Apply 1 application topically 2 (two) times daily as needed for irritation. Special Instructions: apply to brow along with hydrocortisone cream (Patient not taking: Reported on 11/06/2021)   [DISCONTINUED] Ketoconazole-Hydrocortisone 2-2.5 % CREA Apply 1 application topically 2 (two) times daily as needed. Apply to face for rash one time daily as needed. (Patient not taking: Reported on 11/06/2021)   [DISCONTINUED] linaclotide (LINZESS) 290 MCG CAPS capsule Take 290 mcg by mouth daily. Take by mouth 28minutes before breakfast on an empty stomach at 7:30am. Be sure to swallow whole (Patient not taking: Reported on 11/06/2021)   [DISCONTINUED] Menthol, Topical Analgesic, (BIOFREEZE) 4 % GEL Apply topically in the morning, at noon, in the evening, and at bedtime. Apply to knee (Patient not taking: Reported on 11/06/2021)   [DISCONTINUED] mometasone (ELOCON) 0.1 % cream May self administer to face twice daily as needed. (Patient not taking: Reported on 11/06/2021)   [DISCONTINUED] triamcinolone cream (KENALOG) 0.5 % Apply 1 application topically 2 (two) times daily. Apply to back for itching in the morning and evening. (Patient not taking: Reported on 11/06/2021)   No facility-administered encounter medications on file as of 11/06/2021.    Review of Systems  Unable to perform ROS: Dementia   Immunization History  Administered Date(s) Administered   Influenza Split 07/23/2013   Influenza Whole 07/05/2009, 06/26/2018   Influenza-Unspecified 07/29/2014, 06/14/2015, 07/11/2016, 07/15/2017,  06/27/2019, 07/05/2020, 07/13/2021   Moderna Sars-Covid-2 Vaccination 09/26/2019, 10/24/2019, 08/02/2020   PFIZER(Purple Top)SARS-COV-2 Vaccination 09/26/2019   PPD Test 06/17/2013   Pneumococcal Conjugate-13 07/22/2017   Pneumococcal Polysaccharide-23 05/05/2009   Td 05/05/2009, 07/16/2019   Unspecified SARS-COV-2 Vaccination 02/21/2021, 06/13/2021   Zoster, Live 01/09/2011  Pertinent  Health Maintenance Due  Topic Date Due   INFLUENZA VACCINE  Completed   DEXA SCAN  Completed   Fall Risk 07/29/2014 06/04/2017 06/10/2018 01/05/2020 03/10/2021  Falls in the past year? No Yes No - -  Was there an injury with Fall? - No - - -  Patient Fall Risk Level - - - High fall risk Moderate fall risk   Functional Status Survey:    Vitals:   11/06/21 1341  BP: (!) 127/93  Pulse: 75  Resp: 18  Temp: (!) 97.1 F (36.2 C)  SpO2: 98%  Weight: 143 lb 12.8 oz (65.2 kg)  Height: 5\' 5"  (1.651 m)   Body mass index is 23.93 kg/m. Physical Exam Constitutional:      Appearance: Normal appearance.  HENT:     Head: Normocephalic.     Comments: No erythema or drainage in thorat    Nose: Nose normal.     Mouth/Throat:     Mouth: Mucous membranes are moist.  Eyes:     Extraocular Movements: Extraocular movements intact.     Conjunctiva/sclera: Conjunctivae normal.     Pupils: Pupils are equal, round, and reactive to light.     Comments: Legally blind  Cardiovascular:     Rate and Rhythm: Normal rate and regular rhythm.     Heart sounds: No murmur heard.    Comments: DP pulses R+L present.  Pulmonary:     Effort: Pulmonary effort is normal.     Breath sounds: No rales.  Abdominal:     General: Bowel sounds are normal.     Palpations: Abdomen is soft.     Tenderness: There is no abdominal tenderness.     Comments: Suprapubic and lower back area discomfort.   Genitourinary:    Comments: A pinto bean sized cyst like lump palpated near the clitoris, no warmth, redness, tenderness, or  swelling noted. No vaginal discharge or bleeding. No rash or irritation of the urogenital area.  Musculoskeletal:     Cervical back: Normal range of motion and neck supple.     Right lower leg: No edema.     Left lower leg: No edema.  Skin:    General: Skin is warm and dry.     Findings: Erythema and rash present.     Comments: Left face, right temporal area flaky, itching/burning redness.   Neurological:     General: No focal deficit present.     Mental Status: She is alert. Mental status is at baseline.     Gait: Gait abnormal.     Comments: Oriented to person, place.   Psychiatric:        Mood and Affect: Mood normal.        Behavior: Behavior normal.        Thought Content: Thought content normal.     Comments: The patient appears at her baseline mentation during my examination.     Labs reviewed: Recent Labs    08/15/21 0000 09/07/21 0000 09/19/21 0000  NA 139 137 137  K 4.5 4.3 4.8  CL 101 104 102  CO2 27* 23* 26*  BUN 19 18 20   CREATININE 0.9 1.1 1.0  CALCIUM 9.6 9.1 9.3   Recent Labs    08/15/21 0000 09/07/21 0000 09/19/21 0000  AST 25 27 20   ALT 18 18 13   ALKPHOS 146* 121 131*  ALBUMIN 4.1 7.2* 3.8   Recent Labs    08/15/21 0000 09/07/21 0000 09/19/21 0000  WBC 7.7  4.9 10.3  NEUTROABS 4,843.00 3,798.00 6,365.00  HGB 12.2 12.4 11.9*  HCT 36 38 37  PLT 362 263 465*   Lab Results  Component Value Date   TSH 1.62 08/15/2021   Lab Results  Component Value Date   HGBA1C 5.9 02/05/2017   Lab Results  Component Value Date   CHOL 247 (A) 11/21/2018   HDL 72 (A) 11/21/2018   LDLCALC 147 11/21/2018   LDLDIRECT 190.0 03/24/2012   TRIG 150 11/21/2018   CHOLHDL 4 03/24/2012    Significant Diagnostic Results in last 30 days:  No results found.  Assessment/Plan Cyst of soft tissue A pinto bean sized a cyst like lump palpated near the clitoris, no pain, redness, swelling, or warmth. Will observe, apply warm compress 56min/each qsh x 1week.    Essential hypertension Dbp 93, denied headache, chest pain/pressures, SOB, continue Furosemide , Spironolactone, ASA 81mg  qd. Bun/creat 20/1.0 09/19/21. 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 CT head was negative for acute process. May consider Memantine.   Constipation Stable, on prn Dulcolax po/suppository, prn MOM, MiraLax bid/prn  Allergic rhinitis  takes Claritin, Flonase, Mucinex         Osteoporosis stopped taking Prolia due to cost, ? Allergic to Fosamax.  her son declined DEXA     Family/ staff Communication: plan of care reviewed with the patient and charge nurse.   Labs/tests ordered:  none  Time spend 35 minutes.

## 2021-11-06 NOTE — Telephone Encounter (Signed)
[facility] Requested refill °Pended Rx and sent to [facility] for approval.  °

## 2021-11-08 ENCOUNTER — Other Ambulatory Visit: Payer: Self-pay

## 2021-11-08 ENCOUNTER — Other Ambulatory Visit: Payer: Self-pay | Admitting: Orthopedic Surgery

## 2021-11-08 DIAGNOSIS — F419 Anxiety disorder, unspecified: Secondary | ICD-10-CM

## 2021-11-08 MED ORDER — CHLORDIAZEPOXIDE HCL 25 MG PO CAPS: ORAL_CAPSULE | ORAL | 5 refills | Status: DC

## 2021-11-09 ENCOUNTER — Encounter: Payer: Self-pay | Admitting: Nurse Practitioner

## 2021-11-10 ENCOUNTER — Non-Acute Institutional Stay (SKILLED_NURSING_FACILITY): Payer: Medicare Other | Admitting: Nurse Practitioner

## 2021-11-10 ENCOUNTER — Encounter: Payer: Self-pay | Admitting: Nurse Practitioner

## 2021-11-10 DIAGNOSIS — F418 Other specified anxiety disorders: Secondary | ICD-10-CM

## 2021-11-10 DIAGNOSIS — Z66 Do not resuscitate: Secondary | ICD-10-CM | POA: Diagnosis not present

## 2021-11-10 DIAGNOSIS — M7989 Other specified soft tissue disorders: Secondary | ICD-10-CM

## 2021-11-10 DIAGNOSIS — F0390 Unspecified dementia without behavioral disturbance: Secondary | ICD-10-CM

## 2021-11-10 DIAGNOSIS — K5909 Other constipation: Secondary | ICD-10-CM

## 2021-11-10 DIAGNOSIS — L309 Dermatitis, unspecified: Secondary | ICD-10-CM

## 2021-11-10 DIAGNOSIS — M159 Polyosteoarthritis, unspecified: Secondary | ICD-10-CM

## 2021-11-10 DIAGNOSIS — J309 Allergic rhinitis, unspecified: Secondary | ICD-10-CM

## 2021-11-10 DIAGNOSIS — M8000XA Age-related osteoporosis with current pathological fracture, unspecified site, initial encounter for fracture: Secondary | ICD-10-CM

## 2021-11-10 DIAGNOSIS — K219 Gastro-esophageal reflux disease without esophagitis: Secondary | ICD-10-CM

## 2021-11-10 DIAGNOSIS — I10 Essential (primary) hypertension: Secondary | ICD-10-CM

## 2021-11-10 NOTE — Assessment & Plan Note (Signed)
stopped taking Prolia due to cost, ? Allergic to Fosamax.  her son declined DEXA 

## 2021-11-10 NOTE — Assessment & Plan Note (Signed)
Stable, f/u Psych, continue Amitriptyline, Librium, Fluphenazine, Lorazepam, TSH 1.62 08/15/21

## 2021-11-10 NOTE — Assessment & Plan Note (Signed)
on prn Dulcolax po/suppository, prn MOM, MiraLax bid/prn

## 2021-11-10 NOTE — Assessment & Plan Note (Signed)
subsided, on Mometasone cream, saw Dermatology,

## 2021-11-10 NOTE — Assessment & Plan Note (Signed)
takes Claritin, Flonase, Mucinex       

## 2021-11-10 NOTE — Assessment & Plan Note (Signed)
on Tylenol, Norco

## 2021-11-10 NOTE — Assessment & Plan Note (Addendum)
Elevated Dbp 93, on Furosemide, Spironolactone, ASA 81mg  qd. Bun/creat 20/1.0 09/19/21. Monitor Bp daly x 3 days, then re-eval.

## 2021-11-10 NOTE — Assessment & Plan Note (Signed)
stable, on Famotidine, Maalox qd/prn, Hgb 11.9 09/19/21 

## 2021-11-10 NOTE — Assessment & Plan Note (Signed)
Cyst near clitoris, asymptomatic.

## 2021-11-10 NOTE — Progress Notes (Signed)
Location:  [facility] Room Number: N101/A Place of Service:  SNF (31) Provider:  Trong Gosling Otho Darner, NP  Patient Care Team: [name], MD as PCP - General (Internal Medicine) [name], MD (Ophthalmology) [name], MD (Psychiatry) Camry Robello X, NP as Nurse Practitioner (Nurse Practitioner) [name], MD as Consulting Physician (Orthopedic Surgery)  Extended Emergency Contact Information Primary Emergency Contact: [name], [place] Work Phone: [phone] Relation: Son Secondary Emergency Contact: [name] [place] of [place] Phone: [phone] Relation: Sister  Code Status:  DNR Goals of care: Advanced Directive information Advanced Directives 11/10/2021  Does Patient Have a Medical Advance Directive? Yes  Type of Paramedic of [place];Out of facility DNR (pink MOST or yellow form)  Does patient want to make changes to medical advance directive? No - Patient declined  Copy of [place] in Chart? Yes - validated most recent copy scanned in chart (See row information)  Pre-existing out of facility DNR order (yellow form or pink MOST form) Pink MOST/Yellow Form most recent copy in chart - Physician notified to receive inpatient order     Chief Complaint  Patient presents with   Medical Management of Chronic Issues    Routine visit   Quality Metric Gaps    Discuss the need for Shingrix vaccine, or post pone if patient refuses.    HPI:  Pt is a 81 y.o. female seen today for medical management of chronic diseases.    Cyst near clitoris, asymptomatic.   Face rash, subsided, on Mometasone cream, saw Dermatology,              HTN, on Furosemide, Spironolactone, ASA 81mg  qd. Bun/creat 20/1.0 09/19/21             OA on Tylenol, Norco             GERD, stable, on Famotidine, Maalox qd/prn, Hgb 11.9 09/19/21             Depression/anxiety,  on Amitriptyline, Librium, Fluphenazine, Lorazepam, TSH 1.62 08/15/21             Dementia, supportive care in memory care unit [facility] since Flu 08/2021. CT head was negative for acute process. May consider Memantine.              Chronic constipation, on prn Dulcolax po/suppository, prn MOM, MiraLax bid/prn             Allergic rhinitis, takes Claritin, Flonase, Mucinex        OP, stopped taking Prolia due to cost, ? Allergic to Fosamax.  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, 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   Vitamin D deficiency    Past Surgical History:  Procedure Laterality Date   ABDOMINAL HYSTERECTOMY     ANAL RECTAL MANOMETRY N/A 07/05/2014   Procedure: ANO RECTAL MANOMETRY;  Surgeon: Leighton Ruff, MD;  Location: [facility];  Service: Endoscopy;  Laterality: N/A;   APPENDECTOMY     CAROTID ENDARTERECTOMY     COLONOSCOPY  2008, 2015   TONSILLECTOMY     UPPER GASTROINTESTINAL ENDOSCOPY  2002    Allergies  Allergen Reactions   Biaxin [Clarithromycin]    Clarithromycin    Doxycycline    Fosamax [Alendronate Sodium]    Geodon [Ziprasidone Hydrochloride]    Lipitor [Atorvastatin Calcium]    Penicillins    Pravachol [Pravastatin Sodium]    Statins    Sulfonamide Derivatives    Zithromax [Azithromycin]    Zocor [Simvastatin]      Outpatient Encounter Medications as of 11/10/2021  Medication Sig   Acetaminophen (TYLENOL) 325 MG CAPS Take 650 mg by mouth in the morning and at bedtime. AS NEEDED FOR PAIN *NOT TO EXCEED 3,000 MG IN 24HRS*   acetaminophen (TYLENOL) 325 MG tablet Take 650 mg by mouth every 8 (eight) hours as needed. 8am & 8pm AS NEEDED FOR PAIN *NOT TO EXCEED 3,000 MG IN 24HRS*   alum & mag hydroxide-simeth (MAALOX/MYLANTA) 200-200-20 MG/5ML suspension Take 30 mLs by mouth at bedtime. 9pm   alum & mag hydroxide-simeth (MAALOX/MYLANTA) 200-200-20 MG/5ML suspension Take 30 mLs by mouth every 4 (four) hours as needed for indigestion or heartburn.   amitriptyline (ELAVIL) 50 MG tablet Take 50 mg by mouth at bedtime. 9pm   ARTIFICIAL TEAR OP Apply 1 drop to eye 3 (three) times daily. Both Eyes at 8am, 2pm, and 9pm.   aspirin 81 MG EC tablet Take 81 mg by mouth daily. 9am   Benzocaine (ORAL GEL ANESTHETIC MT) Use as directed in the mouth or throat every 4 (four) hours as needed.   bisacodyl (DULCOLAX) 10 MG suppository Place 10 mg rectally daily as needed for moderate constipation.   bisacodyl (DULCOLAX) 5 MG EC tablet Take 5 mg by mouth daily as needed for moderate constipation.   carbamide peroxide (DEBROX) 6.5 % OTIC solution 5 drops 2 (two) times daily as needed.   chlordiazePOXIDE (LIBRIUM) 25 MG capsule Take one capsule by mouth at bedtime   diclofenac Sodium (VOLTAREN) 1 % GEL Apply 2 g topically 2 (two) times daily as needed. apply to lower back area   famotidine (PEPCID) 20 MG tablet Take 20 mg by mouth at bedtime. 8pm   fluPHENAZine (PROLIXIN) 1 MG tablet Take 1 mg by mouth at bedtime. 8pm.   furosemide (LASIX) 20 MG tablet Take 20 mg by mouth daily. 8am   guaiFENesin (MUCINEX) 600 MG 12 hr tablet Take 600 mg by mouth 2 (two) times daily as needed. (1) tab, oral, As Needed, Administer med as needed for cold/congestion.   HYDROcodone-acetaminophen (NORCO) 5-325 MG tablet Take 1 tablet  by mouth daily.   loratadine (CLARITIN) 10 MG tablet Take 10 mg by mouth in the morning and at bedtime.   LORazepam (ATIVAN) 0.5 MG tablet Take 1 tablet (0.5 mg total) by mouth 2 (two) times daily.   magnesium hydroxide (MILK OF MAGNESIA) 400 MG/5ML suspension Take 30 mLs by mouth daily as needed.    mometasone (ELOCON) 0.1 % cream Apply 1 application topically daily. Apply to face twice a day as needed  Multiple Vitamins-Minerals (CENTRUM SILVER PO) Take 1 tablet by mouth. 8am   OYSTER SHELL PO Take 250 mg by mouth in the morning and at bedtime.   polyethylene glycol (MIRALAX / GLYCOLAX) 17 g packet Take 17 g by mouth in the morning and at bedtime. Bedtime and Once a day-PRN   Polyethylene Glycol 3350 (MIRALAX PO) Take by mouth in the morning. Take  2 capful; Special Instructions: MIX WITH 8oz OF FLUIDS. ** RES. REQUESTED AM DOSAGE GIVEN AT 6:30AM**   saccharomyces boulardii (FLORASTOR) 250 MG capsule Take 250 mg by mouth. 8am   spironolactone (ALDACTONE) 25 MG tablet Take 12.5 mg by mouth daily.   No facility-administered encounter medications on file as of 11/10/2021.    Review of Systems  Unable to perform ROS: Dementia   Immunization History  Administered Date(s) Administered   Influenza Split 07/23/2013   Influenza Whole 07/05/2009, 06/26/2018   Influenza-Unspecified 07/29/2014, 06/14/2015, 07/11/2016, 07/15/2017, 06/27/2019, 07/05/2020, 07/13/2021   Moderna Sars-Covid-2 Vaccination 09/26/2019, 10/24/2019, 08/02/2020   PFIZER(Purple Top)SARS-COV-2 Vaccination 09/26/2019   PPD Test 06/17/2013   Pneumococcal Conjugate-13 07/22/2017   Pneumococcal Polysaccharide-23 05/05/2009   Td 05/05/2009, 07/16/2019   Unspecified SARS-COV-2 Vaccination 02/21/2021, 06/13/2021   Zoster, Live 01/09/2011   Pertinent  Health Maintenance Due  Topic Date Due   INFLUENZA VACCINE  Completed   DEXA SCAN  Completed   Fall Risk 07/29/2014 06/04/2017 06/10/2018 01/05/2020 03/10/2021   Falls in the past year? No Yes No - -  Was there an injury with Fall? - No - - -  Patient Fall Risk Level - - - High fall risk Moderate fall risk   Functional Status Survey:    Vitals:   11/10/21 0859  BP: (!) 127/93  Pulse: 75  Resp: 18  Temp: (!) 97.3 F (36.3 C)  SpO2: 95%  Weight: 143 lb 12.8 oz (65.2 kg)  Height: 5\' 5"  (1.651 m)   Body mass index is 23.93 kg/m. Physical Exam Constitutional:      Appearance: Normal appearance.  HENT:     Head: Normocephalic.     Comments: No erythema or drainage in thorat    Nose: Nose normal.     Mouth/Throat:     Mouth: Mucous membranes are moist.  Eyes:     Extraocular Movements: Extraocular movements intact.     Conjunctiva/sclera: Conjunctivae normal.     Pupils: Pupils are equal, round, and reactive to light.     Comments: Legally blind  Cardiovascular:     Rate and Rhythm: Normal rate and regular rhythm.     Heart sounds: No murmur heard.    Comments: DP pulses R+L present.  Pulmonary:     Effort: Pulmonary effort is normal.     Breath sounds: No rales.  Abdominal:     General: Bowel sounds are normal.     Palpations: Abdomen is soft.     Tenderness: There is no abdominal tenderness.     Comments: Suprapubic and lower back area discomfort.   Genitourinary:    Comments: From previous examination a pinto bean sized cyst like lump palpated near the clitoris, asymptomatic.  Musculoskeletal:     Cervical back: Normal range of motion and neck supple.     Right lower leg: No edema.     Left lower leg: No edema.  Skin:    General: Skin is warm and dry.     Findings: No erythema or rash.  Neurological:     General: No focal deficit present.  Mental Status: She is alert. Mental status is at baseline.     Gait: Gait abnormal.     Comments: Oriented to person, place.   Psychiatric:        Mood and Affect: Mood normal.        Behavior: Behavior normal.        Thought Content: Thought content normal.     Comments: The  patient appears at her baseline mentation during my examination.     Labs reviewed: Recent Labs    08/15/21 0000 09/07/21 0000 09/19/21 0000  NA 139 137 137  K 4.5 4.3 4.8  CL 101 104 102  CO2 27* 23* 26*  BUN 19 18 20   CREATININE 0.9 1.1 1.0  CALCIUM 9.6 9.1 9.3   Recent Labs    08/15/21 0000 09/07/21 0000 09/19/21 0000  AST 25 27 20   ALT 18 18 13   ALKPHOS 146* 121 131*  ALBUMIN 4.1 7.2* 3.8   Recent Labs    08/15/21 0000 09/07/21 0000 09/19/21 0000  WBC 7.7 4.9 10.3  NEUTROABS 4,843.00 3,798.00 6,365.00  HGB 12.2 12.4 11.9*  HCT 36 38 37  PLT 362 263 465*   Lab Results  Component Value Date   TSH 1.62 08/15/2021   Lab Results  Component Value Date   HGBA1C 5.9 02/05/2017   Lab Results  Component Value Date   CHOL 247 (A) 11/21/2018   HDL 72 (A) 11/21/2018   LDLCALC 147 11/21/2018   LDLDIRECT 190.0 03/24/2012   TRIG 150 11/21/2018   CHOLHDL 4 03/24/2012    Significant Diagnostic Results in last 30 days:  No results found.  Assessment/Plan Depression with anxiety Stable, f/u Psych, continue Amitriptyline, Librium, Fluphenazine, Lorazepam, TSH 1.62 08/15/21  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[place]) supportive care in memory care unit [facility] since Flu 08/2021. CT head was negative for acute process. May consider Memantine.   Constipation  on prn Dulcolax po/suppository, prn MOM, MiraLax bid/prn  Allergic rhinitis takes Claritin, Flonase, Mucinex         Osteoporosis stopped taking Prolia due to cost, ? Allergic to Fosamax.  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 Bun/creat 20/1.0 09/19/21. Monitor Bp daly x 3 days, then re-eval.   Dermatitis subsided, on Mometasone cream, saw Dermatology,   Cyst of soft  tissue Cyst near clitoris, asymptomatic.      Family/ staff Communication: plan of care reviewed with the patient and charge nurse.   Labs/tests ordered:  none   Time spend 35 minutes.

## 2021-11-10 NOTE — Assessment & Plan Note (Signed)
supportive care in memory care unit [facility] since Flu 08/2021. CT head was negative for acute process. May consider Memantine.

## 2021-11-13 ENCOUNTER — Encounter: Payer: Self-pay | Admitting: Nurse Practitioner

## 2021-11-29 ENCOUNTER — Encounter: Payer: Self-pay | Admitting: Orthopedic Surgery

## 2021-11-29 ENCOUNTER — Non-Acute Institutional Stay (SKILLED_NURSING_FACILITY): Payer: Medicare Other | Admitting: Orthopedic Surgery

## 2021-11-29 DIAGNOSIS — K219 Gastro-esophageal reflux disease without esophagitis: Secondary | ICD-10-CM

## 2021-11-29 DIAGNOSIS — F418 Other specified anxiety disorders: Secondary | ICD-10-CM

## 2021-11-29 DIAGNOSIS — K5909 Other constipation: Secondary | ICD-10-CM

## 2021-11-29 DIAGNOSIS — I10 Essential (primary) hypertension: Secondary | ICD-10-CM

## 2021-11-29 DIAGNOSIS — F0390 Unspecified dementia without behavioral disturbance: Secondary | ICD-10-CM

## 2021-11-29 DIAGNOSIS — L309 Dermatitis, unspecified: Secondary | ICD-10-CM

## 2021-11-29 NOTE — Progress Notes (Signed)
Location:  [facility] Room Number: N101/A Place of Service:  SNF [phone]) Provider: Yvonna Alanis, NP  Patient Care Team: [name], MD as PCP - General (Internal Medicine) [name], MD (Ophthalmology) [name], MD (Psychiatry) Mast, Man X, NP as Nurse Practitioner (Nurse Practitioner) [name], MD as Consulting Physician (Orthopedic Surgery)  Extended Emergency Contact Information Primary Emergency Contact: [name], [place] Work Phone: [phone] Relation: Son Secondary Emergency Contact: [name] [place] of [place] Phone: [phone] Relation: Sister  Code Status:  DNR Goals of care: Advanced Directive information Advanced Directives 11/29/2021  Does Patient Have a Medical Advance Directive? Yes  Type of Paramedic of [place];Out of facility DNR (pink MOST or yellow form)  Does patient want to make changes to medical advance directive? No - Patient declined  Copy of [place] in Chart? Yes - validated most recent copy scanned in chart (See row information)  Pre-existing out of facility DNR order (yellow form or pink MOST form) Pink MOST/Yellow Form most recent copy in chart - Physician notified to receive inpatient order     Chief Complaint  Patient presents with   Acute Visit    Rash to neck and chest    HPI:  Pt is a 81 y.o. female seen today for an acute visit for acute rash to neck and chest.   She currently resides on the memory care unit due to dementia. Nursing reports she c/o increased itching to left neck and right upper chest. They were concerned for shingles and put her on contact precautions until seen. She denies pain to neck and chest areas. Uses ketoconazole shampoo twice weekly for dermatitis. Recently seen by dermatology for facial rash, resolved with mometasone cream. Zoster vaccine 12/2019. She has not has  Shingrix vaccine.   Dementia- no recent behavioral outbursts, MMSE 20/30, not on medication at this time HTN- BUN/creat 20/1.0 08/2021, remains on furosemide and spironolactone GERD- hgb 11.9 08/2021, remains on famotidine and Maalox Depression/anxiety- no mood changes, remains on amitriptyline, Librium, Fluphenazine and lorazepam  Constipation- LBM 03/08, remains on Dulcolax, MOM and miralax prn    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, 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   Vitamin D deficiency    Past Surgical History:  Procedure Laterality Date   ABDOMINAL HYSTERECTOMY     ANAL RECTAL MANOMETRY N/A 07/05/2014   Procedure: ANO RECTAL MANOMETRY;  Surgeon: Leighton Ruff, MD;  Location: [facility];  Service: Endoscopy;  Laterality: N/A;   APPENDECTOMY     CAROTID ENDARTERECTOMY     COLONOSCOPY  2008, 2015  TONSILLECTOMY     UPPER GASTROINTESTINAL ENDOSCOPY  2002    Allergies  Allergen Reactions   Biaxin [Clarithromycin]    Clarithromycin    Doxycycline    Fosamax [Alendronate Sodium]    Geodon [Ziprasidone Hydrochloride]    Lipitor [Atorvastatin Calcium]    Penicillins    Pravachol [Pravastatin Sodium]    Statins    Sulfonamide Derivatives    Zithromax [Azithromycin]    Zocor [Simvastatin]     Outpatient Encounter Medications as of 11/29/2021  Medication Sig   Acetaminophen (TYLENOL) 325 MG  CAPS Take 650 mg by mouth in the morning and at bedtime. AS NEEDED FOR PAIN *NOT TO EXCEED 3,000 MG IN 24HRS*   alum & mag hydroxide-simeth (MAALOX/MYLANTA) 200-200-20 MG/5ML suspension Take 30 mLs by mouth at bedtime. 9pm   alum & mag hydroxide-simeth (MAALOX/MYLANTA) 200-200-20 MG/5ML suspension Take 30 mLs by mouth every 4 (four) hours as needed for indigestion or heartburn.   amitriptyline (ELAVIL) 50 MG tablet Take 50 mg by mouth at bedtime. 9pm   ARTIFICIAL TEAR OP Apply 1 drop to eye 3 (three) times daily. Both Eyes at 8am, 2pm, and 9pm.   aspirin 81 MG EC tablet Take 81 mg by mouth daily. 9am   Benzocaine (ORAL GEL ANESTHETIC MT) Use as directed in the mouth or throat every 4 (four) hours as needed.   bisacodyl (DULCOLAX) 10 MG suppository Place 10 mg rectally daily as needed for moderate constipation.   bisacodyl (DULCOLAX) 5 MG EC tablet Take 5 mg by mouth daily as needed for moderate constipation.   carbamide peroxide (DEBROX) 6.5 % OTIC solution 5 drops 2 (two) times daily as needed.   chlordiazePOXIDE (LIBRIUM) 25 MG capsule Take one capsule by mouth at bedtime   diclofenac Sodium (VOLTAREN) 1 % GEL Apply 2 g topically 2 (two) times daily as needed. apply to lower back area   famotidine (PEPCID) 20 MG tablet Take 20 mg by mouth at bedtime. 8pm   fluPHENAZine (PROLIXIN) 1 MG tablet Take 1 mg by mouth at bedtime. 8pm.   furosemide (LASIX) 20 MG tablet Take 20 mg by mouth daily. 8am   guaiFENesin (MUCINEX) 600 MG 12 hr tablet Take 600 mg by mouth 2 (two) times daily as needed. (1) tab, oral, As Needed, Administer med as needed for cold/congestion.   HYDROcodone-acetaminophen (NORCO) 5-325 MG tablet Take 1 tablet by mouth daily.   hydrocortisone valerate cream (WEST[place]ORT) 0.2 % Apply 1 application. topically 2 (two) times daily.   ketoconazole (NIZORAL) 2 % shampoo Apply 1 application. topically 2 (two) times a week. Tuesday and Friday   ketoconazole (NIZORAL) 2 % shampoo Apply 1  application. topically in the morning and at bedtime.   loratadine (CLARITIN) 10 MG tablet Take 10 mg by mouth in the morning and at bedtime.   LORazepam (ATIVAN) 0.5 MG tablet Take 1 tablet (0.5 mg total) by mouth 2 (two) times daily.   magnesium hydroxide (MILK OF MAGNESIA) 400 MG/5ML suspension Take 30 mLs by mouth daily as needed.    Multiple Vitamins-Minerals (CENTRUM SILVER PO) Take 1 tablet by mouth. 8am   OYSTER SHELL PO Take 250 mg by mouth in the morning and at bedtime.   polyethylene glycol (MIRALAX / GLYCOLAX) 17 g packet Take 17 g by mouth in the morning and at bedtime. Bedtime and Once a day-PRN   Polyethylene Glycol 3350 (MIRALAX PO) Take by mouth in the morning. Take  2 capful; Special Instructions: MIX WITH 8oz OF FLUIDS. **  RES. REQUESTED AM DOSAGE GIVEN AT 6:30AM**   saccharomyces boulardii (FLORASTOR) 250 MG capsule Take 250 mg by mouth. 8am   spironolactone (ALDACTONE) 25 MG tablet Take 12.5 mg by mouth daily.   acetaminophen (TYLENOL) 325 MG tablet Take 650 mg by mouth every 8 (eight) hours as needed. 8am & 8pm AS NEEDED FOR PAIN *NOT TO EXCEED 3,000 MG IN 24HRS*   [DISCONTINUED] mometasone (ELOCON) 0.1 % cream Apply 1 application topically daily. Apply to face twice a day as needed (Patient not taking: Reported on 11/29/2021)   No facility-administered encounter medications on file as of 11/29/2021.    Review of Systems  Unable to perform ROS: Dementia   Immunization History  Administered Date(s) Administered   Influenza Split 07/23/2013   Influenza Whole 07/05/2009, 06/26/2018   Influenza-Unspecified 07/29/2014, 06/14/2015, 07/11/2016, 07/15/2017, 06/27/2019, 07/05/2020, 07/13/2021   Moderna Sars-Covid-2 Vaccination 09/26/2019, 10/24/2019, 08/02/2020   PFIZER(Purple Top)SARS-COV-2 Vaccination 09/26/2019   PPD Test 06/17/2013   Pneumococcal Conjugate-13 07/22/2017   Pneumococcal Polysaccharide-23 05/05/2009   Td 05/05/2009, 07/16/2019   Unspecified SARS-COV-2  Vaccination 02/21/2021, 06/13/2021   Zoster, Live 01/09/2011   Pertinent  Health Maintenance Due  Topic Date Due   INFLUENZA VACCINE  Completed   DEXA SCAN  Completed   Fall Risk 07/29/2014 06/04/2017 06/10/2018 01/05/2020 03/10/2021  Falls in the past year? No Yes No - -  Was there an injury with Fall? - No - - -  Patient Fall Risk Level - - - High fall risk Moderate fall risk   Functional Status Survey:    Vitals:   11/29/21 1351  BP: 130/78  Pulse: 82  Resp: 20  Temp: 97.8 F (36.6 C)  SpO2: 95%  Weight: 138 lb 6.4 oz (62.8 kg)  Height: '5\' 5"'$  (1.651 m)   Body mass index is 23.03 kg/m. Physical Exam Vitals reviewed.  Constitutional:      General: She is not in acute distress. HENT:     Head: Normocephalic.  Eyes:     General:        Right eye: No discharge.        Left eye: No discharge.  Cardiovascular:     Rate and Rhythm: Normal rate and regular rhythm.     Pulses: Normal pulses.     Heart sounds: Normal heart sounds.  Pulmonary:     Effort: Pulmonary effort is normal. No respiratory distress.     Breath sounds: Normal breath sounds. No wheezing.  Abdominal:     General: Bowel sounds are normal. There is no distension.     Palpations: Abdomen is soft.     Tenderness: There is no abdominal tenderness.  Musculoskeletal:     Cervical back: Neck supple.     Right lower leg: Edema present.     Left lower leg: Edema present.     Comments: Non pitting  Skin:    General: Skin is warm and dry.     Capillary Refill: Capillary refill takes less than 2 seconds.     Findings: Lesion and rash present.     Comments: Small red papules to left neck behind left ear, no specific shape/pattern, 3 small pea-sized papules to right upper chest near neckline, no sign of infection or drainage, non tender to touch.   Neurological:     General: No focal deficit present.     Mental Status: She is alert. Mental status is at baseline.     Motor: Weakness present.     Gait: Gait  abnormal.  Psychiatric:        Mood and Affect: Mood normal.        Behavior: Behavior normal.     Comments: Very pleasant, follows commands, alert to self and familiar faces    Labs reviewed: Recent Labs    08/15/21 0000 09/07/21 0000 09/19/21 0000  NA 139 137 137  K 4.5 4.3 4.8  CL 101 104 102  CO2 27* 23* 26*  BUN '19 18 20  '$ CREATININE 0.9 1.1 1.0  CALCIUM 9.6 9.1 9.3   Recent Labs    08/15/21 0000 09/07/21 0000 09/19/21 0000  AST '25 27 20  '$ ALT '18 18 13  '$ ALKPHOS 146* 121 131*  ALBUMIN 4.1 7.2* 3.8   Recent Labs    08/15/21 0000 09/07/21 0000 09/19/21 0000  WBC 7.7 4.9 10.3  NEUTROABS 4,843.00 3,798.00 6,365.00  HGB 12.2 12.4 11.9*  HCT 36 38 37  PLT 362 263 465*   Lab Results  Component Value Date   TSH 1.62 08/15/2021   Lab Results  Component Value Date   HGBA1C 5.9 02/05/2017   Lab Results  Component Value Date   CHOL 247 (A) 11/21/2018   HDL 72 (A) 11/21/2018   LDLCALC 147 11/21/2018   LDLDIRECT 190.0 03/24/2012   TRIG 150 11/21/2018   CHOLHDL 4 03/24/2012    Significant Diagnostic Results in last 30 days:  No results found.  Assessment/Plan 1. Dermatitis - small papular rash to left neck and right upper chest, not linear in pattern, non tender, denies pain - do not suspect shingles - start triamcinolone 0.1%- apply to left neck, right upper chest bid x 5 days, then bid prn - cont Claritin  2. 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[place]) - no behavioral outbursts - cont memory care  3. Essential hypertension - controlled  -cont furosemide and spironolactone  4. Gastroesophageal reflux disease, unspecified whether esophagitis present - hgb stable - cont famotidine and Maalox  5. Depression with anxiety - no mood changes or panic attacks - cont amitriptyline, Librium, Fluphenazine and lorazepam   6. Constipation - LBM 03/08, abdomen soft - cont Dulcolax, MOM and miralax prn    Family/ staff Communication: plan discussed with patient and  nurse  Labs/tests ordered:  none

## 2021-12-06 ENCOUNTER — Other Ambulatory Visit: Payer: Self-pay | Admitting: Orthopedic Surgery

## 2021-12-06 DIAGNOSIS — F419 Anxiety disorder, unspecified: Secondary | ICD-10-CM

## 2021-12-06 MED ORDER — CHLORDIAZEPOXIDE HCL 25 MG PO CAPS: ORAL_CAPSULE | ORAL | 5 refills | Status: DC

## 2021-12-07 ENCOUNTER — Encounter: Payer: Self-pay | Admitting: Nurse Practitioner

## 2021-12-07 ENCOUNTER — Other Ambulatory Visit: Payer: Self-pay | Admitting: *Deleted

## 2021-12-07 ENCOUNTER — Other Ambulatory Visit: Payer: Self-pay | Admitting: Orthopedic Surgery

## 2021-12-07 ENCOUNTER — Non-Acute Institutional Stay (SKILLED_NURSING_FACILITY): Payer: Medicare Other | Admitting: Nurse Practitioner

## 2021-12-07 DIAGNOSIS — I10 Essential (primary) hypertension: Secondary | ICD-10-CM

## 2021-12-07 DIAGNOSIS — K219 Gastro-esophageal reflux disease without esophagitis: Secondary | ICD-10-CM | POA: Diagnosis not present

## 2021-12-07 DIAGNOSIS — K5909 Other constipation: Secondary | ICD-10-CM

## 2021-12-07 DIAGNOSIS — J309 Allergic rhinitis, unspecified: Secondary | ICD-10-CM

## 2021-12-07 DIAGNOSIS — F0390 Unspecified dementia without behavioral disturbance: Secondary | ICD-10-CM

## 2021-12-07 DIAGNOSIS — M159 Polyosteoarthritis, unspecified: Secondary | ICD-10-CM | POA: Diagnosis not present

## 2021-12-07 DIAGNOSIS — F418 Other specified anxiety disorders: Secondary | ICD-10-CM | POA: Diagnosis not present

## 2021-12-07 DIAGNOSIS — M8000XA Age-related osteoporosis with current pathological fracture, unspecified site, initial encounter for fracture: Secondary | ICD-10-CM

## 2021-12-07 MED ORDER — LORAZEPAM 0.5 MG PO TABS: 0.5000 mg | ORAL_TABLET | Freq: Two times a day (BID) | ORAL | 0 refills | Status: DC

## 2021-12-07 NOTE — Assessment & Plan Note (Signed)
Her mood is stable, on Amitriptyline, Librium, Fluphenazine, Lorazepam, TSH 1.62 08/15/21 

## 2021-12-07 NOTE — Assessment & Plan Note (Signed)
stable, on Famotidine, Maalox qd/prn, Hgb 11.9 09/19/21 

## 2021-12-07 NOTE — Assessment & Plan Note (Signed)
on Tylenol, Norco ?

## 2021-12-07 NOTE — Assessment & Plan Note (Signed)
Stable, , takes Claritin, Flonase, Mucinex       ?

## 2021-12-07 NOTE — Assessment & Plan Note (Signed)
stopped taking Prolia due to cost, ? Allergic to Fosamax.  her son declined DEXA 

## 2021-12-07 NOTE — Progress Notes (Signed)
?Location:   [facility] ?Nursing Home Room Number: 732 ?Place of Service:  SNF (31) ?Provider:  Marlana Latus NP ? ?[name], MD ? ?Patient Care Team: ?[name], MD as PCP - General (Internal Medicine) ?[name], MD (Ophthalmology) ?[name], MD (Psychiatry) ?Davyn Elsasser X, NP as Nurse Practitioner (Nurse Practitioner) ?[name], MD as Consulting Physician (Orthopedic Surgery) ? ?Extended Emergency Contact Information ?Primary Emergency Contact: Zehring,Keith ?         [place], [place] ?Work Phone: [phone] ?Relation: Son ?Secondary Emergency Contact: Foushee,Shelby ? [place] ?Home Phone: [phone] ?Relation: Sister ? ?Code Status:  DNR Managed Care ?Goals of care: Advanced Directive information ?Advanced Directives 12/07/2021  ?Does Patient Have a Medical Advance Directive? Yes  ?Type of Paramedic of [place];Out of facility DNR (pink MOST or yellow form)  ?Does patient want to make changes to medical advance directive? No - Patient declined  ?Copy of [place] in Chart? Yes - validated most recent copy scanned in chart (See row information)  ?Pre-existing out of facility DNR order (yellow form or pink MOST form) Pink MOST/Yellow Form most recent copy in chart - Physician notified to receive inpatient order  ? ? ? ?Chief Complaint  ?Patient presents with  ? Medical Management of Chronic Issues  ? Quality Metric Gaps  ?  Verified [facility] and [facility] patient is due for 2/2 Shingrix  ? ? ?HPI:  ?Pt is a 81 y.o. female seen today for medical management of chronic diseases.   ? Cyst near clitoris, asymptomatic.  ?            Face rash, subsided, on Mometasone cream, saw Dermatology ? Rash left neck, R upper chest, resolved on Triamcinolone cream.  ?            HTN, on Furosemide, Spironolactone, ASA '81mg'$  qd. Bun/creat 20/1.0 09/19/21 ?            OA on Tylenol, Norco ?            GERD, stable, on  Famotidine, Maalox qd/prn, Hgb 11.9 09/19/21 ?            Depression/anxiety, on Amitriptyline, Librium, Fluphenazine, Lorazepam, TSH 1.62 08/15/21 ?            Dementia, supportive care in memory care unit [facility] since Flu 08/2021. CT head was negative for acute process. May consider Memantine.  ?            Chronic constipation, on prn Dulcolax po/suppository, prn MOM, MiraLax bid/prn ?            Allergic rhinitis, takes Claritin, Flonase, Mucinex        ?OP, stopped taking Prolia due to cost, ? Allergic to Fosamax.  her son declined DEXA ?  ? ? ?Past Medical History:  ?Diagnosis Date  ? Allergy   ? Anemia   ? Anxiety   ? Asthma   ? CAD (coronary artery disease)   ? Depression   ? Diverticulosis   ? Dysfunction of eustachian tube   ? Elevated LFTs   ? Esophageal reflux   ? Esophageal stricture 2002  ? Hemorrhoids   ? History of rectal polyps   ? HLD (hyperlipidemia)   ? HTN (hypertension)   ? Hypertonicity of bladder   ? Irritable bowel syndrome with constipation   ? Obesity   ? Optic neuritis   ? Osteoporosis   ?  Peptic ulcer disease   ? Peripheral neuropathy   ? Trigeminal neuralgia   ? prior injury  ? Visual loss, bilateral   ? 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 Vitamin D deficiency   ? ?Past Surgical History:  ?Procedure Laterality Date  ? ABDOMINAL HYSTERECTOMY    ? ANAL RECTAL MANOMETRY N/A 07/05/2014  ? Procedure: ANO RECTAL MANOMETRY;  Surgeon: Leighton Ruff, MD;  Location: [facility];  Service: Endoscopy;  Laterality: N/A;  ? APPENDECTOMY    ? CAROTID ENDARTERECTOMY    ? COLONOSCOPY  2008, 2015  ? TONSILLECTOMY    ? UPPER GASTROINTESTINAL ENDOSCOPY  2002  ? ? ?Allergies  ?Allergen Reactions  ? Biaxin [Clarithromycin]   ? Clarithromycin   ? Doxycycline   ? Fosamax [Alendronate Sodium]   ? Geodon [Ziprasidone Hydrochloride]   ? Lipitor [Atorvastatin Calcium]   ? Penicillins   ? Pravachol [Pravastatin Sodium]   ? Statins   ? Sulfonamide Derivatives   ? Zithromax  [Azithromycin]   ? Zocor [Simvastatin]   ? ? ?Allergies as of 12/07/2021   ? ?   Reactions  ? Biaxin [clarithromycin]   ? Clarithromycin   ? Doxycycline   ? Fosamax [alendronate Sodium]   ? Geodon [ziprasidone Hydrochloride]   ? Lipitor [atorvastatin Calcium]   ? Penicillins   ? Pravachol [pravastatin Sodium]   ? Statins   ? Sulfonamide Derivatives   ? Zithromax [azithromycin]   ? Zocor [simvastatin]   ? ?  ? ?  ?Medication List  ?  ? ?  ? Accurate as of December 07, 2021 11:59 PM. If you have any questions, ask your nurse or doctor.  ?  ?  ? ?  ? ?Tylenol 325 MG Caps ?Generic drug: Acetaminophen ?Take 650 mg by mouth in the morning and at bedtime. AS NEEDED FOR PAIN *NOT TO EXCEED 3,000 MG IN 24HRS* ?  ?acetaminophen 325 MG tablet ?Commonly known as: TYLENOL ?Take 650 mg by mouth every 8 (eight) hours as needed. 8am & 8pm ?AS NEEDED FOR PAIN *NOT TO EXCEED 3,000 MG IN 24HRS* ?  ?alum & mag hydroxide-simeth 200-200-20 MG/5ML suspension ?Commonly known as: MAALOX/MYLANTA ?Take 30 mLs by mouth at bedtime. 9pm ?  ?alum & mag hydroxide-simeth 200-200-20 MG/5ML suspension ?Commonly known as: MAALOX/MYLANTA ?Take 30 mLs by mouth every 4 (four) hours as needed for indigestion or heartburn. ?  ?amitriptyline 50 MG tablet ?Commonly known as: ELAVIL ?Take 50 mg by mouth at bedtime. 9pm ?  ?ARTIFICIAL TEAR OP ?Apply 1 drop to eye 3 (three) times daily. Both Eyes at 8am, 2pm, and 9pm. ?  ?aspirin 81 MG EC tablet ?Take 81 mg by mouth daily. 9am ?  ?bisacodyl 10 MG suppository ?Commonly known as: DULCOLAX ?Place 10 mg rectally daily as needed for moderate constipation. ?  ?bisacodyl 5 MG EC tablet ?Commonly known as: DULCOLAX ?Take 5 mg by mouth daily as needed for moderate constipation. ?  ?carbamide peroxide 6.5 % OTIC solution ?Commonly known as: DEBROX ?5 drops 2 (two) times daily as needed. ?  ?CENTRUM SILVER PO ?Take 1 tablet by mouth. 8am ?  ?chlordiazePOXIDE 25 MG capsule ?Commonly known as: LIBRIUM ?Take one capsule by mouth  at bedtime ?  ?diclofenac Sodium 1 % Gel ?Commonly known as: VOLTAREN ?Apply 2 g topically 2 (two) times daily as needed. apply to lower back area ?  ?famotidine 20 MG tablet ?Commonly known as: PEPCID ?Take 20 mg by mouth at bedtime.  8pm ?  ?fluPHENAZine 1 MG tablet ?Commonly known as: PROLIXIN ?Take 1 mg by mouth at bedtime. 8pm. ?  ?furosemide 20 MG tablet ?Commonly known as: LASIX ?Take 20 mg by mouth daily. 8am ?  ?guaiFENesin 600 MG 12 hr tablet ?Commonly known as: [place] ?Take 600 mg by mouth 2 (two) times daily as needed. (1) tab, oral, As Needed, Administer med as needed for cold/congestion. ?  ?HYDROcodone-acetaminophen 5-325 MG tablet ?Commonly known as: Norco ?Take 1 tablet by mouth daily. ?  ?hydrocortisone valerate cream 0.2 % ?Commonly known as: WESTCORT ?Apply 1 application. topically 2 (two) times daily. ?  ?ketoconazole 2 % shampoo ?Commonly known as: NIZORAL ?Apply 1 application. topically 2 (two) times a week. Tuesday and Friday ?  ?ketoconazole 2 % cream ?Commonly known as: NIZORAL ?Apply 1 application. topically 2 (two) times daily as needed. ?  ?loratadine 10 MG tablet ?Commonly known as: CLARITIN ?Take 10 mg by mouth in the morning and at bedtime. ?  ?LORazepam 0.5 MG tablet ?Commonly known as: ATIVAN ?Take 1 tablet (0.5 mg total) by mouth 2 (two) times daily. ?  ?magnesium hydroxide 400 MG/5ML suspension ?Commonly known as: MILK OF MAGNESIA ?Take 30 mLs by mouth daily as needed. ?  ?MIRALAX PO ?Take by mouth in the morning. Take  2 capful; Special Instructions: MIX WITH 8oz OF FLUIDS. ** RES. REQUESTED AM DOSAGE GIVEN AT 6:30AM** ?  ?polyethylene glycol 17 g packet ?Commonly known as: MIRALAX / GLYCOLAX ?Take 17 g by mouth in the morning and at bedtime. Bedtime and Once a day-PRN ?  ?ORAL GEL ANESTHETIC MT ?Use as directed in the mouth or throat every 4 (four) hours as needed. ?  ?OYSTER SHELL PO ?Take 250 mg by mouth in the morning and at bedtime. ?  ?saccharomyces boulardii 250 MG  capsule ?Commonly known as: FLORASTOR ?Take 250 mg by mouth. 8am ?  ?spironolactone 25 MG tablet ?Commonly known as: ALDACTONE ?Take 12.5 mg by mouth daily. ?  ?triamcinolone cream 0.1 % ?Commonly known as: Fredia Sorrow

## 2021-12-07 NOTE — Assessment & Plan Note (Signed)
Stable, on prn Dulcolax po/suppository, prn MOM, MiraLax bid/prn 

## 2021-12-07 NOTE — Assessment & Plan Note (Signed)
no behavioral issues, supportive care in memory care unit [facility] since Flu 08/2021. CT head was negative for acute process. May consider Memantine.  ?

## 2021-12-07 NOTE — Assessment & Plan Note (Signed)
Blood pressure is controlled, on Furosemide, Spironolactone, ASA 81mg qd. Bun/creat 20/1.0 09/19/21 

## 2021-12-07 NOTE — Telephone Encounter (Signed)
[facility] Requested refill.  ?Pended Rx and sent to [facility] for approval.  ?

## 2021-12-08 ENCOUNTER — Encounter: Payer: Self-pay | Admitting: Nurse Practitioner

## 2022-01-02 ENCOUNTER — Non-Acute Institutional Stay (SKILLED_NURSING_FACILITY): Payer: Medicare Other | Admitting: Internal Medicine

## 2022-01-02 ENCOUNTER — Encounter: Payer: Self-pay | Admitting: Internal Medicine

## 2022-01-02 DIAGNOSIS — M8000XA Age-related osteoporosis with current pathological fracture, unspecified site, initial encounter for fracture: Secondary | ICD-10-CM

## 2022-01-02 DIAGNOSIS — F418 Other specified anxiety disorders: Secondary | ICD-10-CM

## 2022-01-02 DIAGNOSIS — M159 Polyosteoarthritis, unspecified: Secondary | ICD-10-CM | POA: Diagnosis not present

## 2022-01-02 DIAGNOSIS — F0390 Unspecified dementia without behavioral disturbance: Secondary | ICD-10-CM | POA: Diagnosis not present

## 2022-01-02 DIAGNOSIS — I10 Essential (primary) hypertension: Secondary | ICD-10-CM | POA: Diagnosis not present

## 2022-01-02 DIAGNOSIS — K5909 Other constipation: Secondary | ICD-10-CM

## 2022-01-02 DIAGNOSIS — L309 Dermatitis, unspecified: Secondary | ICD-10-CM

## 2022-01-02 NOTE — Progress Notes (Signed)
?Location:   [facility] ?Nursing Home Room Number: 941 ?Place of Service:  SNF (31) ?Provider:  Veleta Miners MD ? ?[name], MD ? ?Patient Care Team: ?[name], MD as PCP - General (Internal Medicine) ?[name], MD (Ophthalmology) ?[name], MD (Psychiatry) ?Mast, Man X, NP as Nurse Practitioner (Nurse Practitioner) ?[name], MD as Consulting Physician (Orthopedic Surgery) ? ?Extended Emergency Contact Information ?Primary Emergency Contact: Matsuura,Keith ?         [place], [place] of [place] ?Work Phone: [phone] ?Relation: Son ?Secondary Emergency Contact: Foushee,Shelby ? [place] ?Home Phone: [phone] ?Relation: Sister ? ?Code Status:  DNR Managed Care ?Goals of care: Advanced Directive information ? ?  01/02/2022  ?  3:48 PM  ?Advanced Directives  ?Does Patient Have a Medical Advance Directive? Yes  ?Type of Paramedic of [place];Out of facility DNR (pink MOST or yellow form)  ?Does patient want to make changes to medical advance directive? No - Patient declined  ?Copy of [place] in Chart? Yes - validated most recent copy scanned in chart (See row information)  ?Pre-existing out of facility DNR order (yellow form or pink MOST form) Pink MOST/Yellow Form most recent copy in chart - Physician notified to receive inpatient order  ? ? ? ?Chief Complaint  ?Patient presents with  ? Medical Management of Chronic Issues  ? ? ?HPI:  ?Pt is a 81 y.o. female seen today for medical management of chronic diseases.   ? ? ?Past Medical History:  ?Diagnosis Date  ? Allergy   ? Anemia   ? Anxiety   ? Asthma   ? CAD (coronary artery disease)   ? Depression   ? Diverticulosis   ? Dysfunction of eustachian tube   ? Elevated LFTs   ? Esophageal reflux   ? Esophageal stricture 2002  ? Hemorrhoids   ? History of rectal polyps   ? HLD (hyperlipidemia)   ? HTN (hypertension)   ? Hypertonicity of bladder   ?  Irritable bowel syndrome with constipation   ? Obesity   ? Optic neuritis   ? Osteoporosis   ? Peptic ulcer disease   ? Peripheral neuropathy   ? Trigeminal neuralgia   ? prior injury  ? Visual loss, bilateral   ? 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 Vitamin D deficiency   ? ?Past Surgical History:  ?Procedure Laterality Date  ? ABDOMINAL HYSTERECTOMY    ? ANAL RECTAL MANOMETRY N/A 07/05/2014  ? Procedure: ANO RECTAL MANOMETRY;  Surgeon: Leighton Ruff, MD;  Location: [facility];  Service: Endoscopy;  Laterality: N/A;  ? APPENDECTOMY    ? CAROTID ENDARTERECTOMY    ? COLONOSCOPY  2008, 2015  ? TONSILLECTOMY    ? UPPER GASTROINTESTINAL ENDOSCOPY  2002  ? ? ?Allergies  ?Allergen Reactions  ? Biaxin [Clarithromycin]   ? Clarithromycin   ? Doxycycline   ? Fosamax [Alendronate Sodium]   ? Geodon [Ziprasidone Hydrochloride]   ? Lipitor [Atorvastatin Calcium]   ? Penicillins   ? Pravachol [Pravastatin Sodium]   ? Statins   ? Sulfonamide Derivatives   ? Zithromax [Azithromycin]   ? Zocor [Simvastatin]   ? ? ?Allergies as of 01/02/2022   ? ?   Reactions  ? Biaxin [clarithromycin]   ? Clarithromycin   ? Doxycycline   ? Fosamax [alendronate Sodium]   ? Geodon [ziprasidone Hydrochloride]   ? Lipitor [atorvastatin Calcium]   ?  Penicillins   ? Pravachol [pravastatin Sodium]   ? Statins   ? Sulfonamide Derivatives   ? Zithromax [azithromycin]   ? Zocor [simvastatin]   ? ?  ? ?  ?Medication List  ?  ? ?  ? Accurate as of January 02, 2022  3:48 PM. If you have any questions, ask your nurse or doctor.  ?  ?  ? ?  ? ?Tylenol 325 MG Caps ?Generic drug: Acetaminophen ?Take 650 mg by mouth in the morning and at bedtime. AS NEEDED FOR PAIN *NOT TO EXCEED 3,000 MG IN 24HRS* ?  ?acetaminophen 325 MG tablet ?Commonly known as: TYLENOL ?Take 650 mg by mouth every 8 (eight) hours as needed. 8am & 8pm ?AS NEEDED FOR PAIN *NOT TO EXCEED 3,000 MG IN 24HRS* ?  ?alum & mag hydroxide-simeth 200-200-20 MG/5ML  suspension ?Commonly known as: MAALOX/MYLANTA ?Take 30 mLs by mouth at bedtime. 9pm ?  ?alum & mag hydroxide-simeth 200-200-20 MG/5ML suspension ?Commonly known as: MAALOX/MYLANTA ?Take 30 mLs by mouth every 4 (four) hours as needed for indigestion or heartburn. ?  ?amitriptyline 50 MG tablet ?Commonly known as: ELAVIL ?Take 50 mg by mouth at bedtime. 9pm ?  ?ARTIFICIAL TEAR OP ?Apply 1 drop to eye 3 (three) times daily. Both Eyes at 8am, 2pm, and 9pm. ?  ?aspirin 81 MG EC tablet ?Take 81 mg by mouth daily. 9am ?  ?bisacodyl 10 MG suppository ?Commonly known as: DULCOLAX ?Place 10 mg rectally daily as needed for moderate constipation. ?  ?bisacodyl 5 MG EC tablet ?Commonly known as: DULCOLAX ?Take 5 mg by mouth daily as needed for moderate constipation. ?  ?carbamide peroxide 6.5 % OTIC solution ?Commonly known as: DEBROX ?5 drops 2 (two) times daily as needed. ?  ?CENTRUM SILVER PO ?Take 1 tablet by mouth. 8am ?  ?chlordiazePOXIDE 25 MG capsule ?Commonly known as: LIBRIUM ?Take one capsule by mouth at bedtime ?  ?diclofenac Sodium 1 % Gel ?Commonly known as: VOLTAREN ?Apply 2 g topically 2 (two) times daily as needed. apply to lower back area ?  ?famotidine 20 MG tablet ?Commonly known as: PEPCID ?Take 20 mg by mouth at bedtime. 8pm ?  ?fluPHENAZine 1 MG tablet ?Commonly known as: PROLIXIN ?Take 1 mg by mouth at bedtime. 8pm. ?  ?furosemide 20 MG tablet ?Commonly known as: LASIX ?Take 20 mg by mouth daily. 8am ?  ?guaiFENesin 600 MG 12 hr tablet ?Commonly known as: [place] ?Take 600 mg by mouth 2 (two) times daily as needed. (1) tab, oral, As Needed, Administer med as needed for cold/congestion. ?  ?HYDROcodone-acetaminophen 5-325 MG tablet ?Commonly known as: Norco ?Take 1 tablet by mouth daily. ?  ?hydrocortisone valerate cream 0.2 % ?Commonly known as: WESTCORT ?Apply 1 application. topically 2 (two) times daily. ?  ?ketoconazole 2 % shampoo ?Commonly known as: NIZORAL ?Apply 1 application. topically 2 (two)  times a week. Tuesday and Friday ?  ?ketoconazole 2 % cream ?Commonly known as: NIZORAL ?Apply 1 application. topically 2 (two) times daily as needed. ?  ?loratadine 10 MG tablet ?Commonly known as: CLARITIN ?Take 10 mg by mouth in the morning and at bedtime. ?  ?LORazepam 0.5 MG tablet ?Commonly known as: ATIVAN ?Take 1 tablet (0.5 mg total) by mouth 2 (two) times daily. ?  ?magnesium hydroxide 400 MG/5ML suspension ?Commonly known as: MILK OF MAGNESIA ?Take 30 mLs by mouth daily as needed. ?  ?MIRALAX PO ?Take by mouth in the morning. Take  2 capful; Special Instructions: MIX  WITH 8oz OF FLUIDS. ** RES. REQUESTED AM DOSAGE GIVEN AT 6:30AM** ?  ?polyethylene glycol 17 g packet ?Commonly known as: MIRALAX / GLYCOLAX ?Take 17 g by mouth in the morning and at bedtime. Bedtime and Once a day-PRN ?  ?ORAL GEL ANESTHETIC MT ?Use as directed in the mouth or throat every 4 (four) hours as needed. ?  ?OYSTER SHELL PO ?Take 250 mg by mouth in the morning and at bedtime. ?  ?saccharomyces boulardii 250 MG capsule ?Commonly known as: FLORASTOR ?Take 250 mg by mouth. 8am ?  ?spironolactone 25 MG tablet ?Commonly known as: ALDACTONE ?Take 12.5 mg by mouth daily. ?  ?triamcinolone cream 0.1 % ?Commonly known as: [place] ?Apply 1 application. topically as needed. ?  ? ?  ? ? ?Review of Systems ? ?Immunization History  ?Administered Date(s) Administered  ? Influenza Split 07/23/2013  ? Influenza Whole 07/05/2009, 06/26/2018  ? Influenza-Unspecified 07/29/2014, 06/14/2015, 07/11/2016, 07/15/2017, 06/27/2019, 07/05/2020, 07/13/2021  ? Moderna Sars-Covid-2 Vaccination 09/26/2019, 10/24/2019, 08/02/2020  ? PFIZER(Purple Top)SARS-COV-2 Vaccination 09/26/2019  ? PPD Test 06/17/2013  ? Pneumococcal Conjugate-13 07/22/2017  ? Pneumococcal Polysaccharide-23 05/05/2009  ? Td 05/05/2009, 07/16/2019  ? Unspecified SARS-COV-2 Vaccination 02/21/2021, 06/13/2021  ? Zoster Recombinat (Shingrix) 09/27/2021, 12/05/2021  ? Zoster, Live 01/09/2011   ? ?Pertinent  Health Maintenance Due  ?Topic Date Due  ? INFLUENZA VACCINE  04/24/2022  ? DEXA SCAN  Completed  ? ? ?  07/29/2014  ?  4:19 PM 06/04/2017  ?  3:07 PM 06/10/2018  ?  1:48 PM 01/05/2020  ?  5:46 AM 03/10/2021

## 2022-01-02 NOTE — Progress Notes (Signed)
? ?Location:  [facility] ?Nursing Home Room Number: 628 ?Place of Service:  SNF (31) ? ?Provider: [name]  ? ?Code Status: DNR ?Goals of Care:  ? ?  01/02/2022  ?  3:48 PM  ?Advanced Directives  ?Does Patient Have a Medical Advance Directive? Yes  ?Type of [place]amed[place] of [place];Out of facility DNR (pink MOST or yellow form)  ?Does patient want to make changes to medical advance directive? No - Patient declined  ?Copy of [place] in Chart? Yes - validated most recent copy scanned in chart (See row information)  ?Pre-existing out of facility DNR order (yellow form or pink MOST form) Pink MOST/Yellow Form most recent copy in chart - Physician notified to receive inpatient order  ? ? ? ?Chief Complaint  ?Patient presents with  ? Medical Management of Chronic Issues  ? ? ?HPI: Patient is a 81 y.o. female seen today for medical management of chronic diseases.   ? ?   ?She has h/o Hypertension, Osteoarthritis, GERD , Constipation , Bilateral Visual Loss and  Anxiety with Depression ?Recent Worsening Cognition and tried to Leave the facility ?She was moved to Memory Unit ? ?She  is stable.No new Nursing issues. No Behavior issues ?Her weight is stable ?Walks with no assist ?Had no Acute complains ?Seemed less anxious ?No Falls ?Wt Readings from Last 3 Encounters:  ?01/02/22 143 lb (64.9 kg)  ?12/07/21 138 lb 6.4 oz (62.8 kg)  ?11/29/21 138 lb 6.4 oz (62.8 kg)  ? ? ?Past Medical History:  ?Diagnosis Date  ? Allergy   ? Anemia   ? Anxiety   ? Asthma   ? CAD (coronary artery disease)   ? Depression   ? Diverticulosis   ? Dysfunction of eustachian tube   ? Elevated LFTs   ? Esophageal reflux   ? Esophageal stricture 2002  ? Hemorrhoids   ? History of rectal polyps   ? HLD (hyperlipidemia)   ? HTN (hypertension)   ? Hypertonicity of bladder   ? Irritable bowel syndrome with constipation   ? Obesity   ? Optic neuritis   ? Osteoporosis   ? Peptic ulcer disease   ?  Peripheral neuropathy   ? Trigeminal neuralgia   ? prior injury  ? Visual loss, bilateral   ? 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 Vitamin D deficiency   ? ? ?Past Surgical History:  ?Procedure Laterality Date  ? ABDOMINAL HYSTERECTOMY    ? ANAL RECTAL MANOMETRY N/A 07/05/2014  ? Procedure: ANO RECTAL MANOMETRY;  Surgeon: Leighton Ruff, MD;  Location: [facility];  Service: Endoscopy;  Laterality: N/A;  ? APPENDECTOMY    ? CAROTID ENDARTERECTOMY    ? COLONOSCOPY  2008, 2015  ? TONSILLECTOMY    ? UPPER GASTROINTESTINAL ENDOSCOPY  2002  ? ? ?Allergies  ?Allergen Reactions  ? Biaxin [Clarithromycin]   ? Clarithromycin   ? Doxycycline   ? Fosamax [Alendronate Sodium]   ? Geodon [Ziprasidone Hydrochloride]   ? Lipitor [Atorvastatin Calcium]   ? Penicillins   ? Pravachol [Pravastatin Sodium]   ? Statins   ? Sulfonamide Derivatives   ? Zithromax [Azithromycin]   ? Zocor [Simvastatin]   ? ? ?Outpatient Encounter Medications as of 01/02/2022  ?Medication Sig  ? Acetaminophen (TYLENOL) 325 MG CAPS Take 650 mg by mouth in the morning and at bedtime. AS NEEDED FOR PAIN *NOT TO EXCEED 3,000 MG IN 24HRS*  ?  acetaminophen (TYLENOL) 325 MG tablet Take 650 mg by mouth every 8 (eight) hours as needed. 8am & 8pm ?AS NEEDED FOR PAIN *NOT TO EXCEED 3,000 MG IN 24HRS*  ? alum & mag hydroxide-simeth (MAALOX/MYLANTA) 200-200-20 MG/5ML suspension Take 30 mLs by mouth at bedtime. 9pm  ? alum & mag hydroxide-simeth (MAALOX/MYLANTA) 200-200-20 MG/5ML suspension Take 30 mLs by mouth every 4 (four) hours as needed for indigestion or heartburn.  ? amitriptyline (ELAVIL) 50 MG tablet Take 50 mg by mouth at bedtime. 9pm  ? ARTIFICIAL TEAR OP Apply 1 drop to eye 3 (three) times daily. Both Eyes at 8am, 2pm, and 9pm.  ? aspirin 81 MG EC tablet Take 81 mg by mouth daily. 9am  ? Benzocaine (ORAL GEL ANESTHETIC MT) Use as directed in the mouth or throat every 4 (four) hours as needed.  ? bisacodyl (DULCOLAX) 10 MG  suppository Place 10 mg rectally daily as needed for moderate constipation.  ? bisacodyl (DULCOLAX) 5 MG EC tablet Take 5 mg by mouth daily as needed for moderate constipation.  ? carbamide peroxide (DEBROX) 6.5 % OTIC solution 5 drops 2 (two) times daily as needed.  ? chlordiazePOXIDE (LIBRIUM) 25 MG capsule Take one capsule by mouth at bedtime  ? diclofenac Sodium (VOLTAREN) 1 % GEL Apply 2 g topically 2 (two) times daily as needed. apply to lower back area  ? famotidine (PEPCID) 20 MG tablet Take 20 mg by mouth at bedtime. 8pm  ? fluPHENAZine (PROLIXIN) 1 MG tablet Take 1 mg by mouth at bedtime. 8pm.  ? furosemide (LASIX) 20 MG tablet Take 20 mg by mouth daily. 8am  ? guaiFENesin (MUCINEX) 600 MG 12 hr tablet Take 600 mg by mouth 2 (two) times daily as needed. (1) tab, oral, As Needed, Administer med as needed for cold/congestion.  ? HYDROcodone-acetaminophen (NORCO) 5-325 MG tablet Take 1 tablet by mouth daily.  ? hydrocortisone valerate cream (WESTCORT) 0.2 % Apply 1 application. topically 2 (two) times daily.  ? ketoconazole (NIZORAL) 2 % cream Apply 1 application. topically 2 (two) times daily as needed.  ? ketoconazole (NIZORAL) 2 % shampoo Apply 1 application. topically 2 (two) times a week. Tuesday and Friday  ? loratadine (CLARITIN) 10 MG tablet Take 10 mg by mouth in the morning and at bedtime.  ? LORazepam (ATIVAN) 0.5 MG tablet Take 1 tablet (0.5 mg total) by mouth 2 (two) times daily.  ? magnesium hydroxide (MILK OF MAGNESIA) 400 MG/5ML suspension Take 30 mLs by mouth daily as needed.   ? Multiple Vitamins-Minerals (CENTRUM SILVER PO) Take 1 tablet by mouth. 8am  ? OYSTER SHELL PO Take 250 mg by mouth in the morning and at bedtime.  ? polyethylene glycol (MIRALAX / GLYCOLAX) 17 g packet Take 17 g by mouth in the morning and at bedtime. Bedtime and Once a day-PRN  ? Polyethylene Glycol 3350 (MIRALAX PO) Take by mouth in the morning. Take  2 capful; Special Instructions: MIX WITH 8oz OF FLUIDS. ** RES.  REQUESTED AM DOSAGE GIVEN AT 6:30AM**  ? saccharomyces boulardii (FLORASTOR) 250 MG capsule Take 250 mg by mouth. 8am  ? spironolactone (ALDACTONE) 25 MG tablet Take 12.5 mg by mouth daily.  ? triamcinolone cream (KENALOG) 0.1 % Apply 1 application. topically as needed.  ? ?No facility-administered encounter medications on file as of 01/02/2022.  ? ? ?Review of Systems:  ?Review of Systems  ?Constitutional:  Negative for activity change and appetite change.  ?HENT: Negative.    ?Respiratory:  Negative for  cough and shortness of breath.   ?Cardiovascular:  Negative for leg swelling.  ?Gastrointestinal:  Negative for constipation.  ?Genitourinary: Negative.   ?Musculoskeletal:  Negative for arthralgias, gait problem and myalgias.  ?Skin: Negative.   ?Neurological:  Negative for dizziness and weakness.  ?Psychiatric/Behavioral:  Positive for confusion. Negative for dysphoric mood and sleep disturbance. The patient is nervous/anxious.   ? ?Health Maintenance  ?Topic Date Due  ? INFLUENZA VACCINE  04/24/2022  ? TETANUS/TDAP  07/15/2029  ? Pneumonia Vaccine 31+ Years old  Completed  ? DEXA SCAN  Completed  ? COVID-19 Vaccine  Completed  ? Zoster Vaccines- Shingrix  Completed  ? HPV VACCINES  Aged Out  ? ? ?Physical Exam: ?Vitals:  ? 01/02/22 1541  ?BP: 120/76  ?Pulse: 76  ?Resp: 18  ?Temp: (!) 96.9 ?F (36.1 ?C)  ?SpO2: 96%  ?Weight: 143 lb (64.9 kg)  ?Height: '5\' 5"'$  (1.651 m)  ? ?Body mass index is 23.8 kg/m?Marland Kitchen ?Physical Exam ?Vitals reviewed.  ?Constitutional:   ?   Appearance: Normal appearance.  ?HENT:  ?   Head: Normocephalic.  ?   Nose: Nose normal.  ?   Mouth/Throat:  ?   Mouth: Mucous membranes are moist.  ?   Pharynx: Oropharynx is clear.  ?Eyes:  ?   Pupils: Pupils are equal, round, and reactive to light.  ?Cardiovascular:  ?   Rate and Rhythm: Normal rate and regular rhythm.  ?   Pulses: Normal pulses.  ?   Heart sounds: Normal heart sounds. No murmur heard. ?Pulmonary:  ?   Effort: Pulmonary effort is normal.  ?    Breath sounds: Normal breath sounds.  ?Abdominal:  ?   General: Abdomen is flat. Bowel sounds are normal.  ?   Palpations: Abdomen is soft.  ?Musculoskeletal:     ?   General: No swelling.  ?   Cervical ba

## 2022-01-29 ENCOUNTER — Other Ambulatory Visit: Payer: Self-pay | Admitting: Adult Health

## 2022-01-29 DIAGNOSIS — M159 Polyosteoarthritis, unspecified: Secondary | ICD-10-CM

## 2022-01-29 MED ORDER — HYDROCODONE-ACETAMINOPHEN 5-325 MG PO TABS: 1.0000 | ORAL_TABLET | Freq: Every day | ORAL | 0 refills | Status: DC

## 2022-02-05 ENCOUNTER — Other Ambulatory Visit: Payer: Self-pay | Admitting: Orthopedic Surgery

## 2022-02-05 DIAGNOSIS — F419 Anxiety disorder, unspecified: Secondary | ICD-10-CM

## 2022-02-05 MED ORDER — LORAZEPAM 0.5 MG PO TABS: 0.5000 mg | ORAL_TABLET | Freq: Two times a day (BID) | ORAL | 0 refills | Status: DC

## 2022-02-07 ENCOUNTER — Encounter: Payer: Self-pay | Admitting: Orthopedic Surgery

## 2022-02-07 ENCOUNTER — Non-Acute Institutional Stay (SKILLED_NURSING_FACILITY): Payer: Medicare Other | Admitting: Orthopedic Surgery

## 2022-02-07 DIAGNOSIS — I10 Essential (primary) hypertension: Secondary | ICD-10-CM

## 2022-02-07 DIAGNOSIS — F418 Other specified anxiety disorders: Secondary | ICD-10-CM

## 2022-02-07 DIAGNOSIS — J309 Allergic rhinitis, unspecified: Secondary | ICD-10-CM

## 2022-02-07 DIAGNOSIS — F0390 Unspecified dementia without behavioral disturbance: Secondary | ICD-10-CM

## 2022-02-07 DIAGNOSIS — K1379 Other lesions of oral mucosa: Secondary | ICD-10-CM

## 2022-02-07 DIAGNOSIS — M8000XA Age-related osteoporosis with current pathological fracture, unspecified site, initial encounter for fracture: Secondary | ICD-10-CM

## 2022-02-07 DIAGNOSIS — K219 Gastro-esophageal reflux disease without esophagitis: Secondary | ICD-10-CM

## 2022-02-07 DIAGNOSIS — K5909 Other constipation: Secondary | ICD-10-CM

## 2022-02-07 DIAGNOSIS — M159 Polyosteoarthritis, unspecified: Secondary | ICD-10-CM

## 2022-02-07 NOTE — Progress Notes (Signed)
Location:  [facility] Room Number: N101/A Place of Service:  SNF [id number]) Provider: Yvonna Alanis, NP  Patient Care Team: [name], MD as PCP - General (Internal Medicine) [name], MD (Ophthalmology) [name], MD (Psychiatry) Mast, Man X, NP as Nurse Practitioner (Nurse Practitioner) [name], MD as Consulting Physician (Orthopedic Surgery)  Extended Emergency Contact Information Primary Emergency Contact: [name], [place] Work Phone: [phone] Relation: Son Secondary Emergency Contact: [name] [place] Phone: [phone] Relation: Sister  Code Status:  DNR Goals of care: Advanced Directive information    02/07/2022    4:05 PM  Advanced Directives  Does Patient Have a Medical Advance Directive? Yes  Type of Paramedic of [place];Out of facility DNR (pink MOST or yellow form)  Does patient want to make changes to medical advance directive? No - Patient declined  Copy of [place] in Chart? Yes - validated most recent copy scanned in chart (See row information)  Pre-existing out of facility DNR order (yellow form or pink MOST form) Pink MOST/Yellow Form most recent copy in chart - Physician notified to receive inpatient order     Chief Complaint  Patient presents with   Medical Management of Chronic Issues    Routine visit.     HPI:  Pt is a 81 y.o. female seen today for medical management of chronic diseases.    She currently resides on the memory care unit at [facility]. PMH: CAD, HTN, PVD, constipation, GERD, IBS, rectocele, optic neuritis, OA, osteoporosis, depression and anxiety.   Dementia- moved to memory care from [place] about 5 months ago, MMSE 20/30 03/2021, ambulates on own,  05/07 hallucinations of seeing spiders, recently started on Namenda Mouth sores- wears dentures, reports loose  dentures makes her bite side of mouth, she has a small sore to the inner right cheek HTN- BUN/creat 20/1.0 08/2021, remains on furosemide and spironolactone GERD- hgb 11.9 08/2021, remains on famotidine and Maalox OA- involves multiple joints, remains on norco and tylenol Osteoporosis- DEXA 2018 t score - 3.6, off Prolia due to cost, allergic to fosamax, remains on calcium supplement, no future bone density studies per HPOA Allergic rhinitis- remains on Claritin and mucinex Depression/anxiety- no mood changes, remains on amitriptyline, Librium, Fluphenazine and lorazepam  Constipation- LBM 05/16, remains on bisacodyl, MOM and miralax prn  Recent blood pressures:  05/15- 138/70, 135/70  05/08- 134/80  05/01- 112/64  Recent weights:  05/01- 141 lbs  04/01- 143 lbs  03/01- 138.4 lbs       Past Medical History:  Diagnosis Date   Allergy    Anemia    Anxiety    Asthma    CAD (coronary artery disease)    Depression    Diverticulosis    Dysfunction of eustachian tube    Elevated LFTs    Esophageal reflux    Esophageal stricture 2002   Hemorrhoids    History of rectal polyps    HLD (hyperlipidemia)    HTN (hypertension)    Hypertonicity of bladder    Irritable bowel syndrome with constipation    Obesity    Optic neuritis    Osteoporosis    Peptic ulcer disease    Peripheral neuropathy    Trigeminal neuralgia    prior injury   Visual loss, bilateral    left- retinal artery occulusion vs  optic  neuritis, 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   Vitamin D deficiency    Past Surgical History:  Procedure Laterality Date   ABDOMINAL HYSTERECTOMY     ANAL RECTAL MANOMETRY N/A 07/05/2014   Procedure: ANO RECTAL MANOMETRY;  Surgeon: Leighton Ruff, MD;  Location: [facility];  Service: Endoscopy;  Laterality: N/A;   APPENDECTOMY     CAROTID ENDARTERECTOMY     COLONOSCOPY  2008, 2015   TONSILLECTOMY     UPPER GASTROINTESTINAL ENDOSCOPY  2002    Allergies  Allergen Reactions   Biaxin  [Clarithromycin]    Clarithromycin    Doxycycline    Fosamax [Alendronate Sodium]    Geodon [Ziprasidone Hydrochloride]    Lipitor [Atorvastatin Calcium]    Penicillins    Pravachol [Pravastatin Sodium]    Statins    Sulfonamide Derivatives    Zithromax [Azithromycin]    Zocor [Simvastatin]     Outpatient Encounter Medications as of 02/07/2022  Medication Sig   Acetaminophen (TYLENOL) 325 MG CAPS Take 650 mg by mouth in the morning and at bedtime. AS NEEDED FOR PAIN *NOT TO EXCEED 3,000 MG IN 24HRS*   acetaminophen (TYLENOL) 325 MG tablet Take 650 mg by mouth every 8 (eight) hours as needed. 8am & 8pm AS NEEDED FOR PAIN *NOT TO EXCEED 3,000 MG IN 24HRS*   alum & mag hydroxide-simeth (MAALOX/MYLANTA) 200-200-20 MG/5ML suspension Take 30 mLs by mouth at bedtime. 9pm   alum & mag hydroxide-simeth (MAALOX/MYLANTA) 200-200-20 MG/5ML suspension Take 30 mLs by mouth every 4 (four) hours as needed for indigestion or heartburn.   amitriptyline (ELAVIL) 50 MG tablet Take 50 mg by mouth at bedtime. 9pm   ARTIFICIAL TEAR OP Apply 1 drop to eye 3 (three) times daily. Both Eyes at 8am, 2pm, and 9pm.   aspirin 81 MG EC tablet Take 81 mg by mouth daily. 9am   Benzocaine (ORAL GEL ANESTHETIC MT) Use as directed in the mouth or throat every 4 (four) hours as needed.   bisacodyl (DULCOLAX) 10 MG suppository Place 10 mg rectally daily as needed for moderate constipation.   bisacodyl (DULCOLAX) 5 MG EC tablet Take 5 mg by mouth daily as needed for moderate constipation.   carbamide peroxide (DEBROX) 6.5 % OTIC solution 5 drops every evening.   chlordiazePOXIDE (LIBRIUM) 25 MG capsule Take one capsule by mouth at bedtime   diclofenac Sodium (VOLTAREN) 1 % GEL Apply 2 g topically 2 (two) times daily as needed. apply to lower back area   famotidine (PEPCID) 20 MG tablet Take 20 mg by mouth at bedtime. 8pm   fluPHENAZine (PROLIXIN) 1 MG tablet Take 1 mg by mouth at bedtime. 8pm.   furosemide (LASIX) 20 MG  tablet Take 20 mg by mouth daily. 8am   guaiFENesin (MUCINEX) 600 MG 12 hr tablet Take 600 mg by mouth daily as needed (Cold or cogestion).   HYDROcodone-acetaminophen (NORCO) 5-325 MG tablet Take 1 tablet by mouth daily.   hydrocortisone valerate cream (WESTCORT) 0.2 % Apply 1 application. topically 2 (two) times daily.   ketoconazole (NIZORAL) 2 % cream Apply 1 application. topically 2 (two) times daily as needed.   ketoconazole (NIZORAL) 2 % shampoo Apply 1 application. topically 2 (two) times a week. Tuesday and Friday   loratadine (CLARITIN) 10 MG tablet Take 10 mg by mouth daily.   LORazepam (ATIVAN) 0.5 MG tablet Take 1 tablet (0.5 mg total) by mouth 2 (two) times daily.   magnesium hydroxide (MILK OF MAGNESIA) 400 MG/5ML suspension Take 30 mLs by mouth  daily as needed.    memantine (NAMENDA) 5 MG tablet Take 5 mg by mouth 2 (two) times daily.   Multiple Vitamins-Minerals (SENTRY SENIOR) TABS Take 1 tablet by mouth daily.   OYSTER SHELL PO Take 250 mg by mouth in the morning and at bedtime.   polyethylene glycol (MIRALAX / GLYCOLAX) 17 g packet Take 17 g by mouth daily as needed.   Polyethylene Glycol 3350 (MIRALAX PO) Take by mouth in the morning. Take  2 capful; Special Instructions: MIX WITH 8oz OF FLUIDS. ** RES. REQUESTED AM DOSAGE GIVEN AT 6:30AM**   saccharomyces boulardii (FLORASTOR) 250 MG capsule Take 250 mg by mouth. 8am   spironolactone (ALDACTONE) 25 MG tablet Take 12.5 mg by mouth daily.   triamcinolone cream (KENALOG) 0.1 % Apply 1 application. topically as needed.   [DISCONTINUED] Multiple Vitamins-Minerals (CENTRUM SILVER PO) Take 1 tablet by mouth. 8am   No facility-administered encounter medications on file as of 02/07/2022.    Review of Systems  Unable to perform ROS: Dementia   Immunization History  Administered Date(s) Administered   Influenza Split 07/23/2013   Influenza Whole 07/05/2009, 06/26/2018   Influenza-Unspecified 07/29/2014, 06/14/2015, 07/11/2016,  07/15/2017, 06/27/2019, 07/05/2020, 07/13/2021   Moderna Sars-Covid-2 Vaccination 09/26/2019, 10/24/2019, 08/02/2020   PFIZER(Purple Top)SARS-COV-2 Vaccination 09/26/2019   PPD Test 06/17/2013   Pneumococcal Conjugate-13 07/22/2017   Pneumococcal Polysaccharide-23 05/05/2009   Td 05/05/2009, 07/16/2019   Unspecified SARS-COV-2 Vaccination 02/21/2021, 06/13/2021   Zoster Recombinat (Shingrix) 09/27/2021, 12/05/2021   Zoster, Live 01/09/2011   Pertinent  Health Maintenance Due  Topic Date Due   INFLUENZA VACCINE  04/24/2022   DEXA SCAN  Completed      07/29/2014    4:19 PM 06/04/2017    3:07 PM 06/10/2018    1:48 PM 01/05/2020    5:46 AM 03/10/2021    1:02 PM  [place] in the past year? No Yes No    Was there an injury with Fall?  No     Patient Fall Risk Level    High fall risk Moderate fall risk   Functional Status Survey:    Vitals:   02/07/22 1546  BP: 138/70  Pulse: 74  Resp: 18  Temp: (!) 97.2 F (36.2 C)  SpO2: 95%  Weight: 141 lb (64 kg)  Height: '5\' 5"'$  (1.651 m)   Body mass index is 23.46 kg/m. Physical Exam Vitals reviewed.  Constitutional:      General: She is not in acute distress. HENT:     Head: Normocephalic.     Right Ear: There is no impacted cerumen.     Left Ear: There is no impacted cerumen.     Nose: Nose normal.     Mouth/Throat:     Mouth: Mucous membranes are moist.     Comments: Small area of redness to right inner cheek, pea sized, no sign of infection, dentures appear loose in mouth Eyes:     General:        Right eye: No discharge.        Left eye: No discharge.  Cardiovascular:     Rate and Rhythm: Normal rate and regular rhythm.     Pulses: Normal pulses.     Heart sounds: Normal heart sounds.  Pulmonary:     Effort: No respiratory distress.     Breath sounds: Normal breath sounds. No wheezing.  Abdominal:     General: Bowel sounds are normal. There is no distension.     Tenderness:  There is no abdominal tenderness.   Musculoskeletal:     Cervical back: Neck supple.     Right lower leg: No edema.     Left lower leg: No edema.  Skin:    General: Skin is warm and dry.     Capillary Refill: Capillary refill takes less than 2 seconds.  Neurological:     General: No focal deficit present.     Mental Status: She is alert. Mental status is at baseline.     Motor: Weakness present.     Gait: Gait abnormal.  Psychiatric:        Mood and Affect: Mood normal.        Behavior: Behavior normal.     Comments: Very pleasant, follows commands, alert to self and person    Labs reviewed: Recent Labs    08/15/21 0000 09/07/21 0000 09/19/21 0000  NA 139 137 137  K 4.5 4.3 4.8  CL 101 104 102  CO2 27* 23* 26*  BUN '19 18 20  '$ CREATININE 0.9 1.1 1.0  CALCIUM 9.6 9.1 9.3   Recent Labs    08/15/21 0000 09/07/21 0000 09/19/21 0000  AST '25 27 20  '$ ALT '18 18 13  '$ ALKPHOS 146* 121 131*  ALBUMIN 4.1 7.2* 3.8   Recent Labs    08/15/21 0000 09/07/21 0000 09/19/21 0000  WBC 7.7 4.9 10.3  NEUTROABS 4,843.00 3,798.00 6,365.00  HGB 12.2 12.4 11.9*  HCT 36 38 37  PLT 362 263 465*   Lab Results  Component Value Date   TSH 1.62 08/15/2021   Lab Results  Component Value Date   HGBA1C 5.9 02/05/2017   Lab Results  Component Value Date   CHOL 247 (A) 11/21/2018   HDL 72 (A) 11/21/2018   LDLCALC 147 11/21/2018   LDLDIRECT 190.0 03/24/2012   TRIG 150 11/21/2018   CHOLHDL 4 03/24/2012    Significant Diagnostic Results in last 30 days:  No results found.  Assessment/Plan 1. Mouth sores - small area of redness to right inner cheek - dentures appear loose - discussed denture adhesive with patient and nursing - benzocaine (ORAJEL) 10 % mucosal gel; Use as directed 1 application. in the mouth or throat 3 (three) times daily as needed for mouth pain.  Dispense: 5.3 g; Refill: 0  2. 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  Essential hypertension - controlled  - cont furosemide and spirolactone  4. Gastroesophageal reflux disease, unspecified whether esophagitis present - hgb stable - cont Pepcid and Maalox  5. 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 - cont norco and tylenol  6. Age-related osteoporosis with current pathological fracture, initial encounter - DEXA 2018, t score -3.6 - off Prolia due to cost - allery to Fosamax - cont calcium supplement - no further studies per HPOA  7. Allergic rhinitis, unspecified seasonality, unspecified trigger - cont Claritin and mucinex  8. Depression with anxiety - no mood changes - cont amitriptyline, Librium, Fluphenazine and lorazepam   9. Constipation - abdomen soft - cont bisacodyl, MOM and miralax prn     Family/ staff Communication: plan discussed with patient and nurse  Labs/tests ordered:  none

## 2022-02-08 MED ORDER — BENZOCAINE 10 % MT GEL: 1.0000 "application " | Freq: Three times a day (TID) | OROMUCOSAL | 0 refills | Status: DC | PRN

## 2022-02-09 ENCOUNTER — Other Ambulatory Visit: Payer: Self-pay | Admitting: Orthopedic Surgery

## 2022-02-09 DIAGNOSIS — F419 Anxiety disorder, unspecified: Secondary | ICD-10-CM

## 2022-02-09 MED ORDER — CHLORDIAZEPOXIDE HCL 25 MG PO CAPS: ORAL_CAPSULE | ORAL | 5 refills | Status: DC

## 2022-02-26 ENCOUNTER — Other Ambulatory Visit: Payer: Self-pay | Admitting: Adult Health

## 2022-02-26 DIAGNOSIS — M159 Polyosteoarthritis, unspecified: Secondary | ICD-10-CM

## 2022-02-26 MED ORDER — HYDROCODONE-ACETAMINOPHEN 5-325 MG PO TABS: 1.0000 | ORAL_TABLET | Freq: Every day | ORAL | 0 refills | Status: DC

## 2022-03-07 ENCOUNTER — Other Ambulatory Visit: Payer: Self-pay | Admitting: Adult Health

## 2022-03-07 ENCOUNTER — Non-Acute Institutional Stay (SKILLED_NURSING_FACILITY): Payer: Medicare Other | Admitting: Adult Health

## 2022-03-07 ENCOUNTER — Encounter: Payer: Self-pay | Admitting: Adult Health

## 2022-03-07 DIAGNOSIS — K219 Gastro-esophageal reflux disease without esophagitis: Secondary | ICD-10-CM

## 2022-03-07 DIAGNOSIS — F418 Other specified anxiety disorders: Secondary | ICD-10-CM

## 2022-03-07 DIAGNOSIS — J309 Allergic rhinitis, unspecified: Secondary | ICD-10-CM | POA: Diagnosis not present

## 2022-03-07 DIAGNOSIS — I10 Essential (primary) hypertension: Secondary | ICD-10-CM | POA: Diagnosis not present

## 2022-03-07 DIAGNOSIS — F0390 Unspecified dementia without behavioral disturbance: Secondary | ICD-10-CM

## 2022-03-07 DIAGNOSIS — F419 Anxiety disorder, unspecified: Secondary | ICD-10-CM

## 2022-03-07 MED ORDER — LORAZEPAM 0.5 MG PO TABS: 0.5000 mg | ORAL_TABLET | Freq: Two times a day (BID) | ORAL | 0 refills | Status: DC

## 2022-03-07 NOTE — Progress Notes (Signed)
Location:  [facility] Room Number: 101-A Place of Service:  SNF (31) Provider:  Durenda Age, DNP, FNP-BC  Patient Care Team: [name], MD as PCP - General (Internal Medicine) [name], MD (Ophthalmology) [name], MD (Psychiatry) Mast, Man X, NP as Nurse Practitioner (Nurse Practitioner) [name], MD as Consulting Physician (Orthopedic Surgery)  Extended Emergency Contact Information Primary Emergency Contact: [name], [place] Work Phone: [phone] Relation: Son Secondary Emergency Contact: [name] [place] of [place] Phone: [phone] Relation: Sister  Code Status:  DNR  Goals of care: Advanced Directive information    03/07/2022    2:36 PM  Advanced Directives  Does Patient Have a Medical Advance Directive? Yes  Type of Paramedic of [place];Living will;Out of facility DNR (pink MOST or yellow form)  Does patient want to make changes to medical advance directive? No - Patient declined  Copy of [place] in Chart? Yes - validated most recent copy scanned in chart (See row information)  Pre-existing out of facility DNR order (yellow form or pink MOST form) Pink MOST/Yellow Form most recent copy in chart - Physician notified to receive inpatient order     Chief Complaint  Patient presents with   Medical Management of Chronic Issues    Routine Visit    HPI:  Pt is a 81 y.o. female seen today for medical management of chronic diseases. She is a long-term care resident of [facility]. She is cared for in the memory care unit.She was seen in her room today.  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[place])  -  BIMS score 15/15, takes Namenda    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, 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   Vitamin D deficiency    Past Surgical History:  Procedure Laterality Date   ABDOMINAL HYSTERECTOMY     ANAL RECTAL MANOMETRY N/A 07/05/2014   Procedure: ANO RECTAL MANOMETRY;  Surgeon: Leighton Ruff, MD;  Location: [facility];  Service: Endoscopy;  Laterality: N/A;   APPENDECTOMY     CAROTID ENDARTERECTOMY     COLONOSCOPY  2008, 2015   TONSILLECTOMY     UPPER GASTROINTESTINAL ENDOSCOPY  2002    Allergies  Allergen Reactions   Biaxin [Clarithromycin]    Clarithromycin  Doxycycline    Fosamax [Alendronate Sodium]    Geodon [Ziprasidone Hydrochloride]    Lipitor [Atorvastatin Calcium]    Penicillins    Pravachol [Pravastatin Sodium]    Statins    Sulfonamide Derivatives    Zithromax [Azithromycin]    Zocor [Simvastatin]     Outpatient Encounter Medications as of 03/07/2022  Medication Sig   Acetaminophen (TYLENOL) 325 MG CAPS Take 650 mg by mouth in the morning and at bedtime. AS NEEDED FOR PAIN *NOT TO EXCEED 3,000 MG IN 24HRS*   acetaminophen (TYLENOL) 325 MG tablet Take 650 mg by mouth every 8 (eight) hours as  needed. 8am & 8pm AS NEEDED FOR PAIN *NOT TO EXCEED 3,000 MG IN 24HRS*   alum & mag hydroxide-simeth (MAALOX/MYLANTA) 200-200-20 MG/5ML suspension Take 30 mLs by mouth at bedtime. 9pm   alum & mag hydroxide-simeth (MAALOX/MYLANTA) 200-200-20 MG/5ML suspension Take 30 mLs by mouth every 4 (four) hours as needed for indigestion or heartburn.   amitriptyline (ELAVIL) 50 MG tablet Take 50 mg by mouth at bedtime. 9pm   ARTIFICIAL TEAR OP Apply 1 drop to eye 3 (three) times daily. Both Eyes at 8am, 2pm, and 9pm.   aspirin 81 MG EC tablet Take 81 mg by mouth daily. 9am   Benzocaine (ORAJEL MT) Use as directed in the mouth or throat. 20-0.26-0.15 %; amt: SMALL AMOUNT Three Times A Day - PRN;  mucous membrane Special Instructions: Apply to right inner mouth sore TID PRN [DX: Other specified disorders of teeth and supporting structures]  Every 4 Hours - PRN;may self-administer   bisacodyl (DULCOLAX) 10 MG suppository Place 10 mg rectally daily as needed for moderate constipation.   bisacodyl (DULCOLAX) 5 MG EC tablet Take 5 mg by mouth daily as needed for moderate constipation.   carbamide peroxide (DEBROX) 6.5 % OTIC solution 5 drops every evening.   diclofenac Sodium (VOLTAREN) 1 % GEL Apply 2 g topically 2 (two) times daily as needed. apply to lower back area   famotidine (PEPCID) 20 MG tablet Take 20 mg by mouth at bedtime. 8pm   fluPHENAZine (PROLIXIN) 1 MG tablet Take 1 mg by mouth at bedtime. 8pm.   furosemide (LASIX) 20 MG tablet Take 20 mg by mouth daily. 8am   guaiFENesin (MUCINEX) 600 MG 12 hr tablet Take 600 mg by mouth daily as needed (Cold or cogestion).   HYDROcodone-acetaminophen (NORCO) 5-325 MG tablet Take 1 tablet by mouth daily.   hydrocortisone valerate cream (WESTCORT) 0.2 % Apply 1 application. topically 2 (two) times daily.   ketoconazole (NIZORAL) 2 % cream Apply 1 application. topically 2 (two) times daily as needed.   ketoconazole (NIZORAL) 2 % shampoo Apply 1 application.  topically 2 (two) times a week. Tuesday and Friday   loratadine (CLARITIN) 10 MG tablet Take 10 mg by mouth daily.   LORazepam (ATIVAN) 0.5 MG tablet Take 1 tablet (0.5 mg total) by mouth 2 (two) times daily.   magnesium hydroxide (MILK OF MAGNESIA) 400 MG/5ML suspension Take 30 mLs by mouth daily as needed.    memantine (NAMENDA) 5 MG tablet Take 5 mg by mouth 2 (two) times daily.   Multiple Vitamins-Minerals (SENTRY SENIOR) TABS Take 1 tablet by mouth daily. 500-300-250 mcg [DX: Vitamin deficiency, unspecified]   OYSTER SHELL PO Take 250 mg by mouth in the morning and at bedtime.   polyethylene glycol (MIRALAX / GLYCOLAX) 17 g packet Take 17 g by mouth daily as needed.   Polyethylene Glycol 3350 (MIRALAX PO) Take by  mouth in the morning. Take  2 capful; Special Instructions: MIX WITH 8oz OF FLUIDS. ** RES. REQUESTED AM DOSAGE GIVEN AT 6:30AM**   saccharomyces boulardii (FLORASTOR) 250 MG capsule Take 250 mg by mouth. 8am   spironolactone (ALDACTONE) 25 MG tablet Take 12.5 mg by mouth daily.   triamcinolone cream (KENALOG) 0.1 % Apply 1 application. topically as needed.   [DISCONTINUED] chlordiazePOXIDE (LIBRIUM) 25 MG capsule Take one capsule by mouth at bedtime   [DISCONTINUED] benzocaine (ORAJEL) 10 % mucosal gel Use as directed 1 application. in the mouth or throat 3 (three) times daily as needed for mouth pain.   No facility-administered encounter medications on file as of 03/07/2022.    Review of Systems  Constitutional:  Negative for appetite change, chills, fatigue and fever.  HENT:  Negative for congestion, hearing loss, rhinorrhea and sore throat.   Eyes: Negative.        Bilaterally blind  Respiratory:  Negative for cough, shortness of breath and wheezing.   Cardiovascular:  Negative for chest pain, palpitations and leg swelling.  Gastrointestinal:  Negative for abdominal pain, constipation, diarrhea, nausea and vomiting.  Genitourinary:  Negative for dysuria.  Musculoskeletal:   Negative for arthralgias, back pain and myalgias.  Skin:  Negative for color change, rash and wound.  Neurological:  Negative for dizziness, weakness and headaches.  Psychiatric/Behavioral:  Negative for behavioral problems. The patient is not nervous/anxious.       Immunization History  Administered Date(s) Administered   Influenza Split 07/23/2013   Influenza Whole 07/05/2009, 06/26/2018   Influenza-Unspecified 07/29/2014, 06/14/2015, 07/11/2016, 07/15/2017, 06/27/2019, 07/05/2020, 07/13/2021   Moderna SARS-COV2 Booster Vaccination 02/09/2022   Moderna Sars-Covid-2 Vaccination 09/26/2019, 10/24/2019, 08/02/2020   PFIZER(Purple Top)SARS-COV-2 Vaccination 09/26/2019   PPD Test 06/17/2013   Pneumococcal Conjugate-13 07/22/2017   Pneumococcal Polysaccharide-23 05/05/2009   Td 05/05/2009, 07/16/2019   Unspecified SARS-COV-2 Vaccination 02/21/2021, 06/13/2021   Zoster Recombinat (Shingrix) 09/27/2021, 12/05/2021   Zoster, Live 01/09/2011   Pertinent  Health Maintenance Due  Topic Date Due   INFLUENZA VACCINE  04/24/2022   DEXA SCAN  Completed      07/29/2014    4:19 PM 06/04/2017    3:07 PM 06/10/2018    1:48 PM 01/05/2020    5:46 AM 03/10/2021    1:02 PM  [place] in the past year? No Yes No    Was there an injury with Fall?  No     Patient Fall Risk Level    High fall risk Moderate fall risk     Vitals:   03/07/22 1431  BP: 140/60  Pulse: 72  Resp: 18  Temp: (!) 97.2 F (36.2 C)  SpO2: 95%  Weight: 140 lb 9.6 oz (63.8 kg)  Height: '5\' 5"'$  (1.651 m)   Body mass index is 23.4 kg/m.  Physical Exam Constitutional:      General: She is not in acute distress.    Appearance: Normal appearance.  HENT:     Head: Normocephalic and atraumatic.     Nose: Nose normal.     Mouth/Throat:     Mouth: Mucous membranes are moist.  Eyes:     Conjunctiva/sclera: Conjunctivae normal.  Cardiovascular:     Rate and Rhythm: Normal rate and regular rhythm.  Pulmonary:      Effort: Pulmonary effort is normal.     Breath sounds: Normal breath sounds.  Abdominal:     General: Bowel sounds are normal.     Palpations: Abdomen is soft.  Musculoskeletal:        General: Normal range of motion.     Cervical back: Normal range of motion.  Skin:    General: Skin is warm and dry.  Neurological:     General: No focal deficit present.     Mental Status: She is alert and oriented to person, place, and time.  Psychiatric:        Mood and Affect: Mood normal.        Behavior: Behavior normal.        Labs reviewed: Recent Labs    08/15/21 0000 09/07/21 0000 09/19/21 0000  NA 139 137 137  K 4.5 4.3 4.8  CL 101 104 102  CO2 27* 23* 26*  BUN '19 18 20  '$ CREATININE 0.9 1.1 1.0  CALCIUM 9.6 9.1 9.3   Recent Labs    08/15/21 0000 09/07/21 0000 09/19/21 0000  AST '25 27 20  '$ ALT '18 18 13  '$ ALKPHOS 146* 121 131*  ALBUMIN 4.1 7.2* 3.8   Recent Labs    08/15/21 0000 09/07/21 0000 09/19/21 0000  WBC 7.7 4.9 10.3  NEUTROABS 4,843.00 3,798.00 6,365.00  HGB 12.2 12.4 11.9*  HCT 36 38 37  PLT 362 263 465*   Lab Results  Component Value Date   TSH 1.62 08/15/2021   Lab Results  Component Value Date   HGBA1C 5.9 02/05/2017   Lab Results  Component Value Date   CHOL 247 (A) 11/21/2018   HDL 72 (A) 11/21/2018   LDLCALC 147 11/21/2018   LDLDIRECT 190.0 03/24/2012   TRIG 150 11/21/2018   CHOLHDL 4 03/24/2012    Significant Diagnostic Results in last 30 days:  No results found.  Assessment/Plan  1. Essential hypertension -Blood pressure well controlled Continue current medications  2. Gastroesophageal reflux disease, unspecified whether esophagitis present -Stable, continue famotidine  3. Depression with anxiety -  Mood is  stable, continue amitriptyline, Ativan, chlordiazepoxide and fluphenazine  4. Allergic rhinitis, unspecified seasonality, unspecified trigger -   Stable, continue Claritin  5. 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  Labs/tests ordered:   None    Durenda Age, DNP, MSN, FNP-BC [facility] [phone] (Monday-Friday 8:00 a.m. - 5:00 p.m.) [phone] (after hours)

## 2022-03-14 ENCOUNTER — Other Ambulatory Visit: Payer: Self-pay | Admitting: Adult Health

## 2022-03-14 DIAGNOSIS — F419 Anxiety disorder, unspecified: Secondary | ICD-10-CM

## 2022-03-14 MED ORDER — CHLORDIAZEPOXIDE HCL 25 MG PO CAPS: ORAL_CAPSULE | ORAL | 0 refills | Status: DC

## 2022-03-28 ENCOUNTER — Other Ambulatory Visit: Payer: Self-pay | Admitting: Adult Health

## 2022-03-28 DIAGNOSIS — M159 Polyosteoarthritis, unspecified: Secondary | ICD-10-CM

## 2022-03-28 MED ORDER — HYDROCODONE-ACETAMINOPHEN 5-325 MG PO TABS: 1.0000 | ORAL_TABLET | Freq: Every day | ORAL | 0 refills | Status: DC

## 2022-04-09 ENCOUNTER — Other Ambulatory Visit: Payer: Self-pay | Admitting: Orthopedic Surgery

## 2022-04-09 DIAGNOSIS — F419 Anxiety disorder, unspecified: Secondary | ICD-10-CM

## 2022-04-09 MED ORDER — LORAZEPAM 0.5 MG PO TABS: 0.5000 mg | ORAL_TABLET | Freq: Two times a day (BID) | ORAL | 0 refills | Status: DC

## 2022-04-11 ENCOUNTER — Other Ambulatory Visit: Payer: Self-pay | Admitting: Orthopedic Surgery

## 2022-04-11 DIAGNOSIS — F419 Anxiety disorder, unspecified: Secondary | ICD-10-CM

## 2022-04-11 MED ORDER — CHLORDIAZEPOXIDE HCL 25 MG PO CAPS: ORAL_CAPSULE | ORAL | 0 refills | Status: DC

## 2022-04-17 ENCOUNTER — Encounter: Payer: Self-pay | Admitting: Internal Medicine

## 2022-04-17 ENCOUNTER — Non-Acute Institutional Stay (SKILLED_NURSING_FACILITY): Payer: Medicare Other | Admitting: Internal Medicine

## 2022-04-17 DIAGNOSIS — F0390 Unspecified dementia without behavioral disturbance: Secondary | ICD-10-CM | POA: Diagnosis not present

## 2022-04-17 DIAGNOSIS — F418 Other specified anxiety disorders: Secondary | ICD-10-CM

## 2022-04-17 DIAGNOSIS — M8000XA Age-related osteoporosis with current pathological fracture, unspecified site, initial encounter for fracture: Secondary | ICD-10-CM | POA: Diagnosis not present

## 2022-04-17 DIAGNOSIS — I10 Essential (primary) hypertension: Secondary | ICD-10-CM

## 2022-04-17 DIAGNOSIS — M159 Polyosteoarthritis, unspecified: Secondary | ICD-10-CM

## 2022-04-17 NOTE — Progress Notes (Unsigned)
Location:   [facility] Room Number: [place] of Service:  SNF [id number]) Provider:  Veleta Miners MD  [name], MD  Patient Care Team: [name], MD as PCP - General (Internal Medicine) [name], MD (Ophthalmology) [name], MD (Psychiatry) Mast, Man X, NP as Nurse Practitioner (Nurse Practitioner) [name], MD as Consulting Physician (Orthopedic Surgery)  Extended Emergency Contact Information Primary Emergency Contact: [name], [place] Work Phone: [phone] Relation: Son Secondary Emergency Contact: [name] [place] of [place] Phone: [phone] Relation: Sister  Code Status:  DNR Managed Care Goals of care: Advanced Directive information    04/17/2022    4:18 PM  Advanced Directives  Does Patient Have a Medical Advance Directive? Yes  Type of Paramedic of [place];Living will;Out of facility DNR (pink MOST or yellow form)  Does patient want to make changes to medical advance directive? No - Patient declined  Copy of [place] in Chart? Yes - validated most recent copy scanned in chart (See row information)  Pre-existing out of facility DNR order (yellow form or pink MOST form) Pink MOST/Yellow Form most recent copy in chart - Physician notified to receive inpatient order     Chief Complaint  Patient presents with   Medical Management of Chronic Issues    HPI:  Pt is a 81 y.o. female seen today for medical management of chronic diseases.    Lives in Memory unit Was moved due to episode of Elopement due to worsening Cognition She has h/o Hypertension, Osteoarthritis, GERD , Constipation , Bilateral Visual Loss and  Anxiety with Depression Dementia with behavior issues  Walks with no assist She is stable. No new Nursing issues. No Behavior issues Her weight is stable r No Falls Wt Readings from Last 3  Encounters:  04/17/22 139 lb 6.4 oz (63.2 kg)  03/07/22 140 lb 9.6 oz (63.8 kg)  02/07/22 141 lb (64 kg)   Had no Complain today 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, 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   Vitamin D deficiency    Past Surgical History:  Procedure Laterality Date   ABDOMINAL HYSTERECTOMY     ANAL RECTAL MANOMETRY N/A 07/05/2014   Procedure: ANO RECTAL MANOMETRY;  Surgeon: Leighton Ruff, MD;  Location: [facility];  Service: Endoscopy;  Laterality: N/A;   APPENDECTOMY     CAROTID ENDARTERECTOMY     COLONOSCOPY  2008, 2015   TONSILLECTOMY     UPPER GASTROINTESTINAL ENDOSCOPY  2002    Allergies  Allergen Reactions   Biaxin [Clarithromycin]    Clarithromycin    Doxycycline  Fosamax [Alendronate Sodium]    Geodon [Ziprasidone Hydrochloride]    Lipitor [Atorvastatin Calcium]    Penicillins    Pravachol [Pravastatin Sodium]    Statins    Sulfonamide Derivatives    Zithromax [Azithromycin]    Zocor [Simvastatin]     Allergies as of 04/17/2022       Reactions   Biaxin [clarithromycin]    Clarithromycin    Doxycycline    Fosamax [alendronate Sodium]    Geodon [ziprasidone Hydrochloride]    Lipitor [atorvastatin Calcium]    Penicillins    Pravachol [pravastatin Sodium]    Statins    Sulfonamide Derivatives    Zithromax [azithromycin]    Zocor [simvastatin]          Medication List        Accurate as of April 17, 2022  4:19 PM. If you have any questions, ask your nurse or doctor.          Tylenol 325 MG Caps Generic drug: Acetaminophen Take 650 mg by mouth in the morning and at bedtime. AS NEEDED FOR PAIN *NOT TO EXCEED 3,000 MG IN 24HRS*   acetaminophen 325 MG tablet Commonly known as: TYLENOL Take 650 mg by mouth every 8 (eight) hours as needed. 8am & 8pm AS NEEDED FOR PAIN *NOT TO EXCEED 3,000 MG IN 24HRS*   alum & mag hydroxide-simeth 732-202-54 MG/5ML suspension Commonly known as: MAALOX/MYLANTA Take 30 mLs by mouth at bedtime. 9pm   alum & mag hydroxide-simeth 200-200-20 MG/5ML suspension Commonly known as: MAALOX/MYLANTA Take 30 mLs by mouth every 4 (four) hours as needed for indigestion or heartburn.   amitriptyline 50 MG tablet Commonly known as: ELAVIL Take 50 mg by mouth at bedtime. 9pm   ARTIFICIAL TEAR OP Apply 1 drop to eye 3 (three) times daily. Both Eyes at 8am, 2pm, and 9pm.   aspirin EC 81 MG tablet Take 81 mg by mouth daily. 9am   bisacodyl 10 MG suppository Commonly known as: DULCOLAX Place 10 mg rectally daily as needed for moderate constipation.   bisacodyl 5 MG EC tablet Commonly known as: DULCOLAX Take 5 mg by mouth daily as needed for moderate constipation.   carbamide peroxide 6.5 % OTIC solution Commonly known as: DEBROX 5 drops every evening.   chlordiazePOXIDE 25 MG capsule Commonly known as: LIBRIUM Take one capsule by mouth at bedtime   diclofenac Sodium 1 % Gel Commonly known as: VOLTAREN Apply 2 g topically 2 (two) times daily as needed. apply to lower back area   famotidine 20 MG tablet Commonly known as: PEPCID Take 20 mg by mouth at bedtime. 8pm   fluPHENAZine 1 MG tablet Commonly known as: PROLIXIN Take 1 mg by mouth at bedtime. 8pm.   furosemide 20 MG tablet Commonly known as: LASIX Take 20 mg by mouth daily. 8am   guaiFENesin 600 MG 12 hr tablet Commonly known as:  MUCINEX Take 600 mg by mouth daily as needed (Cold or cogestion).   HYDROcodone-acetaminophen 5-325 MG tablet Commonly known as: Norco Take 1 tablet by mouth daily.   hydrocortisone valerate cream 0.2 % Commonly known as: WESTCORT Apply 1 application. topically 2 (two) times daily.   ketoconazole 2 % shampoo Commonly known as: NIZORAL Apply 1 application. topically 2 (two) times a week. Tuesday and Friday   ketoconazole 2 % cream Commonly known as: NIZORAL Apply 1 application. topically 2 (two) times daily as needed.   loratadine 10 MG tablet Commonly known as: CLARITIN Take 10 mg by mouth  daily.   LORazepam 0.5 MG tablet Commonly known as: ATIVAN Take 1 tablet (0.5 mg total) by mouth 2 (two) times daily.   magnesium hydroxide 400 MG/5ML suspension Commonly known as: MILK OF MAGNESIA Take 30 mLs by mouth daily as needed.   memantine 5 MG tablet Commonly known as: NAMENDA Take 5 mg by mouth 2 (two) times daily.   MIRALAX PO Take by mouth in the morning. Take  2 capful; Special Instructions: MIX WITH 8oz OF FLUIDS. ** RES. REQUESTED AM DOSAGE GIVEN AT 6:30AM**   polyethylene glycol 17 g packet Commonly known as: MIRALAX / GLYCOLAX Take 17 g by mouth daily as needed.   ORAJEL MT Use as directed in the mouth or throat. 20-0.26-0.15 %; amt: SMALL AMOUNT Three Times A Day - PRN;  mucous membrane Special Instructions: Apply to right inner mouth sore TID PRN [DX: Other specified disorders of teeth and supporting structures]  Every 4 Hours - PRN;may self-administer   OYSTER SHELL PO Take 250 mg by mouth in the morning and at bedtime.   saccharomyces boulardii 250 MG capsule Commonly known as: FLORASTOR Take 250 mg by mouth. 8am   Sentry Senior Tabs Take 1 tablet by mouth daily. 500-300-250 mcg [DX: Vitamin deficiency, unspecified]   spironolactone 25 MG tablet Commonly known as: ALDACTONE Take 12.5 mg by mouth daily.   triamcinolone cream 0.1 % Commonly known  as: KENALOG Apply 1 application. topically as needed.        Review of Systems  Constitutional:  Negative for activity change and appetite change.  HENT: Negative.    Respiratory:  Negative for cough and shortness of breath.   Cardiovascular:  Negative for leg swelling.  Gastrointestinal:  Negative for constipation.  Genitourinary: Negative.   Musculoskeletal:  Negative for arthralgias, gait problem and myalgias.  Skin: Negative.   Neurological:  Negative for dizziness and weakness.  Psychiatric/Behavioral:  Positive for confusion. Negative for dysphoric mood and sleep disturbance.     Immunization History  Administered Date(s) Administered   Influenza Split 07/23/2013   Influenza Whole 07/05/2009, 06/26/2018   Influenza-Unspecified 07/29/2014, 06/14/2015, 07/11/2016, 07/15/2017, 06/27/2019, 07/05/2020, 07/13/2021   Moderna SARS-COV2 Booster Vaccination 02/09/2022   Moderna Sars-Covid-2 Vaccination 09/26/2019, 10/24/2019, 08/02/2020   PFIZER(Purple Top)SARS-COV-2 Vaccination 09/26/2019   PPD Test 06/17/2013   Pneumococcal Conjugate-13 07/22/2017   Pneumococcal Polysaccharide-23 05/05/2009   Td 05/05/2009, 07/16/2019   Unspecified SARS-COV-2 Vaccination 02/21/2021, 06/13/2021   Zoster Recombinat (Shingrix) 09/27/2021, 12/05/2021   Zoster, Live 01/09/2011   Pertinent  Health Maintenance Due  Topic Date Due   INFLUENZA VACCINE  04/24/2022   DEXA SCAN  Completed      07/29/2014    4:19 PM 06/04/2017    3:07 PM 06/10/2018    1:48 PM 01/05/2020    5:46 AM 03/10/2021    1:02 PM  [place] in the past year? No Yes No    Was there an injury with Fall?  No     Patient Fall Risk Level    High fall risk Moderate fall risk   Functional Status Survey:    Vitals:   04/17/22 1613  BP: 137/90  Pulse: 77  Resp: 16  Temp: 97.9 F (36.6 C)  SpO2: 94%  Weight: 139 lb 6.4 oz (63.2 kg)  Height: '5\' 5"'$  (1.651 m)   Body mass index is 23.2 kg/m. Physical Exam Vitals  reviewed.  Constitutional:      Appearance: Normal appearance.  HENT:     Head:  Normocephalic.     Nose: Nose normal.     Mouth/Throat:     Mouth: Mucous membranes are moist.     Pharynx: Oropharynx is clear.  Eyes:     Pupils: Pupils are equal, round, and reactive to light.  Cardiovascular:     Rate and Rhythm: Normal rate and regular rhythm.     Pulses: Normal pulses.     Heart sounds: Normal heart sounds. No murmur heard. Pulmonary:     Effort: Pulmonary effort is normal.     Breath sounds: Normal breath sounds.  Abdominal:     General: Abdomen is flat. Bowel sounds are normal.     Palpations: Abdomen is soft.  Musculoskeletal:        General: No swelling.     Cervical back: Neck supple.  Skin:    General: Skin is warm.  Neurological:     General: No focal deficit present.     Mental Status: She is alert.  Psychiatric:        Mood and Affect: Mood normal.        Thought Content: Thought content normal.     Labs reviewed: Recent Labs    08/15/21 0000 09/07/21 0000 09/19/21 0000  NA 139 137 137  K 4.5 4.3 4.8  CL 101 104 102  CO2 27* 23* 26*  BUN '19 18 20  '$ CREATININE 0.9 1.1 1.0  CALCIUM 9.6 9.1 9.3   Recent Labs    08/15/21 0000 09/07/21 0000 09/19/21 0000  AST '25 27 20  '$ ALT '18 18 13  '$ ALKPHOS 146* 121 131*  ALBUMIN 4.1 7.2* 3.8   Recent Labs    08/15/21 0000 09/07/21 0000 09/19/21 0000  WBC 7.7 4.9 10.3  NEUTROABS 4,843.00 3,798.00 6,365.00  HGB 12.2 12.4 11.9*  HCT 36 38 37  PLT 362 263 465*   Lab Results  Component Value Date   TSH 1.62 08/15/2021   Lab Results  Component Value Date   HGBA1C 5.9 02/05/2017   Lab Results  Component Value Date   CHOL 247 (A) 11/21/2018   HDL 72 (A) 11/21/2018   LDLCALC 147 11/21/2018   LDLDIRECT 190.0 03/24/2012   TRIG 150 11/21/2018   CHOLHDL 4 03/24/2012    Significant Diagnostic Results in last 30 days:  No results found.  Assessment/Plan 1. Essential hypertension On Aldactone and  Lasix  2. Depression with anxiety Elavil, Librium and Fluphenazine from her Psych previously No Changes have been made as she is stable  3. 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[facility]) Tolerated Namenda Increase to 10 mg BID  4. Age-related osteoporosis with current pathological fracture, initial encounter Did not tolerate Fosamax Refused Prolia Son does not want DEXA repeated    5. 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

## 2022-04-26 LAB — BASIC METABOLIC PANEL
BUN: 24 — AB (ref 4–21)
Chloride: 101 (ref 99–108)
Creatinine: 1.2 — AB (ref 0.5–1.1)
Glucose: 94
Potassium: 4.4 mEq/L (ref 3.5–5.1)
Sodium: 139 (ref 137–147)

## 2022-04-26 LAB — HEPATIC FUNCTION PANEL
ALT: 16 U/L (ref 7–35)
AST: 23 (ref 13–35)
Alkaline Phosphatase: 130 — AB (ref 25–125)
Bilirubin, Total: 0.3

## 2022-04-26 LAB — COMPREHENSIVE METABOLIC PANEL
Albumin: 4.6 (ref 3.5–5.0)
Calcium: 9.7 (ref 8.7–10.7)
Globulin: 2.8
eGFR: 45

## 2022-04-26 LAB — CBC AND DIFFERENTIAL
HCT: 40 (ref 36–46)
Hemoglobin: 13.6 (ref 12.0–16.0)
Neutrophils Absolute: 3039
Platelets: 353 10*3/uL (ref 150–400)
WBC: 5.9

## 2022-04-26 LAB — CBC: RBC: 4.3 (ref 3.87–5.11)

## 2022-04-27 ENCOUNTER — Encounter: Payer: Self-pay | Admitting: Nurse Practitioner

## 2022-04-27 DIAGNOSIS — N183 Chronic kidney disease, stage 3 unspecified: Secondary | ICD-10-CM | POA: Insufficient documentation

## 2022-04-30 ENCOUNTER — Non-Acute Institutional Stay (SKILLED_NURSING_FACILITY): Payer: Medicare Other | Admitting: Nurse Practitioner

## 2022-04-30 ENCOUNTER — Encounter: Payer: Self-pay | Admitting: Nurse Practitioner

## 2022-04-30 DIAGNOSIS — F418 Other specified anxiety disorders: Secondary | ICD-10-CM | POA: Diagnosis not present

## 2022-04-30 DIAGNOSIS — J309 Allergic rhinitis, unspecified: Secondary | ICD-10-CM

## 2022-04-30 DIAGNOSIS — F0390 Unspecified dementia without behavioral disturbance: Secondary | ICD-10-CM | POA: Diagnosis not present

## 2022-04-30 DIAGNOSIS — K219 Gastro-esophageal reflux disease without esophagitis: Secondary | ICD-10-CM | POA: Diagnosis not present

## 2022-04-30 DIAGNOSIS — I10 Essential (primary) hypertension: Secondary | ICD-10-CM

## 2022-04-30 DIAGNOSIS — K5909 Other constipation: Secondary | ICD-10-CM | POA: Diagnosis not present

## 2022-04-30 DIAGNOSIS — M8000XA Age-related osteoporosis with current pathological fracture, unspecified site, initial encounter for fracture: Secondary | ICD-10-CM

## 2022-04-30 DIAGNOSIS — M159 Polyosteoarthritis, unspecified: Secondary | ICD-10-CM

## 2022-04-30 NOTE — Assessment & Plan Note (Signed)
on Amitriptyline, Librium, Fluphenazine, Lorazepam, TSH 1.62 08/15/21

## 2022-04-30 NOTE — Assessment & Plan Note (Signed)
stopped taking Prolia due to cost, ? Allergic to Fosamax.  her son declined DEXA 

## 2022-04-30 NOTE — Assessment & Plan Note (Signed)
stable, on Famotidine, Maalox qd/prn, Hgb 11.9 09/19/21

## 2022-04-30 NOTE — Assessment & Plan Note (Signed)
supportive care in memory care unit [facility] since Flu 08/2021. CT head was negative for acute process. Taking Memantine.  

## 2022-04-30 NOTE — Progress Notes (Unsigned)
Location:  [facility] Room Number: NO/101/A Place of Service:  SNF (31) Provider:  Bessye Stith X, NP [name], MD  Patient Care Team: [name], MD as PCP - General (Internal Medicine) [name], MD (Ophthalmology) [name], MD (Psychiatry) Ahnesti Townsend X, NP as Nurse Practitioner (Nurse Practitioner) [name], MD as Consulting Physician (Orthopedic Surgery)  Extended Emergency Contact Information Primary Emergency Contact: [name], [place] Work Phone: [phone] Relation: Son Secondary Emergency Contact: [name] [place] Phone: [phone] Relation: Sister  Code Status:  DNR Goals of care: Advanced Directive information    04/30/2022   11:30 AM  Advanced Directives  Does Patient Have a Medical Advance Directive? Yes  Type of Paramed[place] of [place];Living will;Out of facility DNR (pink MOST or yellow form)  Does patient want to make changes to medical advance directive? No - Patient declined  Copy of [place] in Chart? Yes - validated most recent copy scanned in chart (See row information)  Pre-existing out of facility DNR order (yellow form or pink MOST form) Pink MOST/Yellow Form most recent copy in chart - Physician notified to receive inpatient order     Chief Complaint  Patient presents with   Medical Management of Chronic Issues    Patient is here for a follow up for chronic conditions patient is due for flu vaccine and fall screening     HPI:  Pt is a 81 y.o. female seen today for managing chronic medical conditions               HTN, on Furosemide, Spironolactone, ASA '81mg'$  qd. Bun/creat 20/1.0 09/19/21             OA on Tylenol, Norco             GERD, stable, on Famotidine, Maalox qd/prn, Hgb 11.9 09/19/21             Depression/anxiety, on Amitriptyline, Librium, Fluphenazine, Lorazepam, TSH  1.62 08/15/21             Dementia, supportive care in memory care unit [facility] since Flu 08/2021. CT head was negative for acute process. Taking Memantine.              Chronic constipation, on prn Dulcolax po/suppository, prn MOM, MiraLax bid/prn             Allergic rhinitis, takes Claritin, Flonase, Mucinex        OP, stopped taking Prolia due to cost, ? Allergic to Fosamax.  her son declined DEXA    Past Medical History:  Diagnosis Date   Allergy    Anemia    Anxiety    Asthma    CAD (coronary artery disease)    Depression    Diverticulosis    Dysfunction of eustachian tube    Elevated LFTs    Esophageal reflux    Esophageal stricture 2002   Hemorrhoids    History of rectal polyps    HLD (hyperlipidemia)    HTN (hypertension)    Hypertonicity of bladder    Irritable bowel syndrome with constipation    Obesity    Optic neuritis    Osteoporosis    Peptic ulcer disease    Peripheral neuropathy    Trigeminal neuralgia    prior injury   Visual loss, bilateral    left- retinal artery occulusion  vs  optic neuritis, 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   Vitamin D deficiency    Past Surgical History:  Procedure Laterality Date   ABDOMINAL HYSTERECTOMY     ANAL RECTAL MANOMETRY N/A 07/05/2014   Procedure: ANO RECTAL MANOMETRY;  Surgeon: Leighton Ruff, MD;  Location: [facility];  Service: Endoscopy;  Laterality: N/A;   APPENDECTOMY     CAROTID ENDARTERECTOMY     COLONOSCOPY  2008, 2015   TONSILLECTOMY     UPPER GASTROINTESTINAL ENDOSCOPY  2002    Allergies  Allergen Reactions   Biaxin [Clarithromycin]    Clarithromycin    Doxycycline    Fosamax [Alendronate Sodium]    Geodon [Ziprasidone Hydrochloride]    Lipitor [Atorvastatin Calcium]    Penicillins    Pravachol [Pravastatin Sodium]    Statins    Sulfonamide Derivatives    Zithromax [Azithromycin]    Zocor [Simvastatin]     Outpatient Encounter Medications as of 04/30/2022  Medication Sig   Acetaminophen (TYLENOL) 325 MG CAPS  Take 650 mg by mouth in the morning and at bedtime. AS NEEDED FOR PAIN *NOT TO EXCEED 3,000 MG IN 24HRS*   acetaminophen (TYLENOL) 325 MG tablet Take 650 mg by mouth every 8 (eight) hours as needed. 8am & 8pm AS NEEDED FOR PAIN *NOT TO EXCEED 3,000 MG IN 24HRS*   alum & mag hydroxide-simeth (MAALOX/MYLANTA) 200-200-20 MG/5ML suspension Take 30 mLs by mouth at bedtime. 9pm   alum & mag hydroxide-simeth (MAALOX/MYLANTA) 200-200-20 MG/5ML suspension Take 30 mLs by mouth every 4 (four) hours as needed for indigestion or heartburn.   amitriptyline (ELAVIL) 50 MG tablet Take 50 mg by mouth at bedtime. 9pm   ARTIFICIAL TEAR OP Apply 1 drop to eye 3 (three) times daily. Both Eyes at 8am, 2pm, and 9pm.   aspirin 81 MG EC tablet Take 81 mg by mouth daily. 9am   Benzocaine (ORAJEL MT) Use as directed in the mouth or throat. 20-0.26-0.15 %; amt: SMALL AMOUNT Three Times A Day - PRN;  mucous membrane Special Instructions: Apply to right inner mouth sore TID PRN [DX: Other specified disorders of teeth and supporting structures]  Every 4 Hours - PRN;may self-administer   bisacodyl (DULCOLAX) 10 MG suppository Place 10 mg rectally daily as needed for moderate constipation.   bisacodyl (DULCOLAX) 5 MG EC tablet Take 5 mg by mouth daily as needed for moderate constipation.   carbamide peroxide (DEBROX) 6.5 % OTIC solution 5 drops every evening.   chlordiazePOXIDE (LIBRIUM) 25 MG capsule Take one capsule by mouth at bedtime   diclofenac Sodium (VOLTAREN) 1 % GEL Apply 2 g topically 2 (two) times daily as needed. apply to lower back area   famotidine (PEPCID) 20 MG tablet Take 20 mg by mouth at bedtime. 8pm   fluPHENAZine (PROLIXIN) 1 MG tablet Take 1 mg by mouth at bedtime. 8pm.   furosemide (LASIX) 20 MG tablet Take 20 mg by mouth daily. 8am   guaiFENesin (MUCINEX) 600 MG 12 hr tablet Take 600 mg by mouth daily as needed (Cold or cogestion).   HYDROcodone-acetaminophen (NORCO) 5-325 MG tablet Take 1 tablet by  mouth daily.   hydrocortisone valerate cream (WESTCORT) 0.2 % Apply 1 application. topically 2 (two) times daily.   ketoconazole (NIZORAL) 2 % cream Apply 1 application. topically 2 (two) times daily as needed.   ketoconazole (NIZORAL) 2 % shampoo Apply 1 application. topically 2 (two) times a week. Tuesday and Friday   loratadine (CLARITIN) 10 MG tablet Take 10 mg by mouth  daily.   LORazepam (ATIVAN) 0.5 MG tablet Take 1 tablet (0.5 mg total) by mouth 2 (two) times daily.   magnesium hydroxide (MILK OF MAGNESIA) 400 MG/5ML suspension Take 30 mLs by mouth daily as needed.    memantine (NAMENDA) 5 MG tablet Take 5 mg by mouth 2 (two) times daily.   Multiple Vitamins-Minerals (SENTRY SENIOR) TABS Take 1 tablet by mouth daily. 500-300-250 mcg [DX: Vitamin deficiency, unspecified]   OYSTER SHELL PO Take 250 mg by mouth in the morning and at bedtime.   polyethylene glycol (MIRALAX / GLYCOLAX) 17 g packet Take 17 g by mouth daily as needed.   Polyethylene Glycol 3350 (MIRALAX PO) Take by mouth in the morning. Take  2 capful; Special Instructions: MIX WITH 8oz OF FLUIDS. ** RES. REQUESTED AM DOSAGE GIVEN AT 6:30AM**   saccharomyces boulardii (FLORASTOR) 250 MG capsule Take 250 mg by mouth. 8am   spironolactone (ALDACTONE) 25 MG tablet Take 12.5 mg by mouth daily.   triamcinolone cream (KENALOG) 0.1 % Apply 1 application. topically as needed.   No facility-administered encounter medications on file as of 04/30/2022.    Review of Systems  Constitutional:  Negative for appetite change, fatigue and fever.  HENT:  Positive for hearing loss. Negative for congestion, rhinorrhea and voice change.   Eyes:  Positive for visual disturbance.       Low vision  Respiratory:  Negative for cough and shortness of breath.   Cardiovascular:  Negative for leg swelling.  Gastrointestinal:  Negative for abdominal pain and constipation.  Genitourinary:  Negative for dysuria, frequency and urgency.  Musculoskeletal:   Positive for arthralgias and gait problem.  Skin:  Negative for color change.  Neurological:  Negative for speech difficulty, weakness and light-headedness.       Memory lapses  Psychiatric/Behavioral:  Positive for confusion. Negative for behavioral problems and sleep disturbance. The patient is not nervous/anxious.     Immunization History  Administered Date(s) Administered   Influenza Split 07/23/2013   Influenza Whole 07/05/2009, 06/26/2018   Influenza-Unspecified 07/29/2014, 06/14/2015, 07/11/2016, 07/15/2017, 06/27/2019, 07/05/2020, 07/13/2021   Moderna SARS-COV2 Booster Vaccination 02/09/2022   Moderna Sars-Covid-2 Vaccination 09/26/2019, 10/24/2019, 08/02/2020   PFIZER(Purple Top)SARS-COV-2 Vaccination 09/26/2019   PPD Test 06/17/2013   Pneumococcal Conjugate-13 07/22/2017   Pneumococcal Polysaccharide-23 05/05/2009   Td 05/05/2009, 07/16/2019   Unspecified SARS-COV-2 Vaccination 02/21/2021, 06/13/2021   Zoster Recombinat (Shingrix) 09/27/2021, 12/05/2021   Zoster, Live 01/09/2011   Pertinent  Health Maintenance Due  Topic Date Due   INFLUENZA VACCINE  04/24/2022   DEXA SCAN  Completed      07/29/2014    4:19 PM 06/04/2017    3:07 PM 06/10/2018    1:48 PM 01/05/2020    5:46 AM 03/10/2021    1:02 PM  [place] in the past year? No Yes No    Was there an injury with Fall?  No     Patient Fall Risk Level    High fall risk Moderate fall risk   Functional Status Survey:    Vitals:   04/30/22 1131  BP: (!) 113/50  Pulse: 74  Resp: 18  Temp: (!) 96.1 F (35.6 C)  SpO2: 94%  Weight: 139 lb (63 kg)  Height: '5\' 5"'$  (1.651 m)   Body mass index is 23.13 kg/m. Physical Exam Constitutional:      Appearance: Normal appearance.  HENT:     Head: Normocephalic.     Nose: Nose normal.     Mouth/Throat:  Mouth: Mucous membranes are moist.  Eyes:     Extraocular Movements: Extraocular movements intact.     Conjunctiva/sclera: Conjunctivae normal.     Pupils:  Pupils are equal, round, and reactive to light.     Comments: Legally blind  Cardiovascular:     Rate and Rhythm: Normal rate and regular rhythm.     Heart sounds: No murmur heard.    Comments: DP pulses R+L present.  Pulmonary:     Effort: Pulmonary effort is normal.     Breath sounds: No rales.  Abdominal:     General: Bowel sounds are normal.     Palpations: Abdomen is soft.     Tenderness: There is no abdominal tenderness.  Genitourinary:    Comments: From previous examination a pinto bean sized cyst like lump palpated near the clitoris, asymptomatic.  Musculoskeletal:     Cervical back: Normal range of motion and neck supple.     Right lower leg: No edema.     Left lower leg: No edema.  Skin:    General: Skin is warm and dry.  Neurological:     General: No focal deficit present.     Mental Status: She is alert. Mental status is at baseline.     Gait: Gait abnormal.     Comments: Oriented to person, place.   Psychiatric:        Mood and Affect: Mood normal.        Behavior: Behavior normal.        Thought Content: Thought content normal.     Comments: The patient appears at her baseline mentation during my examination.      Labs reviewed: Recent Labs    08/15/21 0000 09/07/21 0000 09/19/21 0000  NA 139 137 137  K 4.5 4.3 4.8  CL 101 104 102  CO2 27* 23* 26*  BUN '19 18 20  '$ CREATININE 0.9 1.1 1.0  CALCIUM 9.6 9.1 9.3   Recent Labs    08/15/21 0000 09/07/21 0000 09/19/21 0000  AST '25 27 20  '$ ALT '18 18 13  '$ ALKPHOS 146* 121 131*  ALBUMIN 4.1 7.2* 3.8   Recent Labs    08/15/21 0000 09/07/21 0000 09/19/21 0000  WBC 7.7 4.9 10.3  NEUTROABS 4,843.00 3,798.00 6,365.00  HGB 12.2 12.4 11.9*  HCT 36 38 37  PLT 362 263 465*   Lab Results  Component Value Date   TSH 1.62 08/15/2021   Lab Results  Component Value Date   HGBA1C 5.9 02/05/2017   Lab Results  Component Value Date   CHOL 247 (A) 11/21/2018   HDL 72 (A) 11/21/2018   LDLCALC 147  11/21/2018   LDLDIRECT 190.0 03/24/2012   TRIG 150 11/21/2018   CHOLHDL 4 03/24/2012    Significant Diagnostic Results in last 30 days:  No results found.  Assessment/Plan GERD (gastroesophageal reflux disease) stable, on Famotidine, Maalox qd/prn, Hgb 11.9 09/19/21  Depression with anxiety  on Amitriptyline, Librium, Fluphenazine, Lorazepam, TSH 1.62 08/15/21  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[place]) supportive care in memory care unit [facility] since Flu 08/2021. CT head was negative for acute process. Taking Memantine.   Constipation Stable, on prn Dulcolax po/suppository, prn MOM, MiraLax bid/prn  Allergic rhinitis takes Claritin, Flonase, Mucinex        Osteoporosis stopped taking Prolia due to cost, ? Allergic to Fosamax.  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   Essential hypertension  Blood pressure is controlled, on Furosemide, Spironolactone, ASA '81mg'$  qd. Bun/creat 20/1.0 09/19/21     Family/ staff Communication: plan of care reviewed with the patient and charge nurse.   Labs/tests ordered:  none  Time spend 35 minutes.

## 2022-04-30 NOTE — Assessment & Plan Note (Signed)
on Tylenol, Norco, ambulates with walker.

## 2022-04-30 NOTE — Assessment & Plan Note (Signed)
Stable, on prn Dulcolax po/suppository, prn MOM, MiraLax bid/prn

## 2022-04-30 NOTE — Assessment & Plan Note (Signed)
Blood pressure is controlled, on Furosemide, Spironolactone, ASA '81mg'$  qd. Bun/creat 20/1.0 09/19/21

## 2022-04-30 NOTE — Assessment & Plan Note (Signed)
takes Claritin, Flonase, Mucinex       

## 2022-05-21 ENCOUNTER — Encounter: Payer: Self-pay | Admitting: Nurse Practitioner

## 2022-05-21 ENCOUNTER — Non-Acute Institutional Stay (SKILLED_NURSING_FACILITY): Payer: Medicare Other | Admitting: Nurse Practitioner

## 2022-05-21 DIAGNOSIS — F0390 Unspecified dementia without behavioral disturbance: Secondary | ICD-10-CM

## 2022-05-21 DIAGNOSIS — M159 Polyosteoarthritis, unspecified: Secondary | ICD-10-CM

## 2022-05-21 DIAGNOSIS — K5909 Other constipation: Secondary | ICD-10-CM | POA: Diagnosis not present

## 2022-05-21 DIAGNOSIS — K219 Gastro-esophageal reflux disease without esophagitis: Secondary | ICD-10-CM

## 2022-05-21 DIAGNOSIS — J309 Allergic rhinitis, unspecified: Secondary | ICD-10-CM | POA: Diagnosis not present

## 2022-05-21 DIAGNOSIS — M8000XA Age-related osteoporosis with current pathological fracture, unspecified site, initial encounter for fracture: Secondary | ICD-10-CM | POA: Diagnosis not present

## 2022-05-21 DIAGNOSIS — I739 Peripheral vascular disease, unspecified: Secondary | ICD-10-CM

## 2022-05-21 DIAGNOSIS — F418 Other specified anxiety disorders: Secondary | ICD-10-CM

## 2022-05-21 NOTE — Assessment & Plan Note (Signed)
on Tylenol, Norco, ambulates with walker.

## 2022-05-21 NOTE — Assessment & Plan Note (Signed)
stopped taking Prolia due to cost, ? Allergic to Fosamax.  her son declined DEXA 

## 2022-05-21 NOTE — Progress Notes (Unsigned)
Location:   [facility] Room Number: NO/101/A Place of Service:  SNF (31) Provider: [facility] Quenten Nawaz NP  [name], MD  Patient Care Team: [name], MD as PCP - General (Internal Medicine) [name], MD (Ophthalmology) [name], MD (Psychiatry) Jozey Janco X, NP as Nurse Practitioner (Nurse Practitioner) [name], MD as Consulting Physician (Orthopedic Surgery)  Extended Emergency Contact Information Primary Emergency Contact: [name], [place] Work Phone: [phone] Relation: Son Secondary Emergency Contact: [name] [place] Phone: [phone] Relation: Sister  Code Status: DNR Goals of care: Advanced Directive information    04/30/2022   11:30 AM  Advanced Directives  Does Patient Have a Medical Advance Directive? Yes  Type of Paramedic of [place];Living will;Out of facility DNR (pink MOST or yellow form)  Does patient want to make changes to medical advance directive? No - Patient declined  Copy of [place] in Chart? Yes - validated most recent copy scanned in chart (See row information)  Pre-existing out of facility DNR order (yellow form or pink MOST form) Pink MOST/Yellow Form most recent copy in chart - Physician notified to receive inpatient order     Chief Complaint  Patient presents with   Acute Visit    Patient is being seen for constipation     HPI:  Pt is a 81 y.o. female seen today for an acute visit for c/o constipation, last BM this morning, but hard stool noted.   HTN,                 OA on Tylenol, Norco, ambulates with walker.              GERD, stable, on Famotidine, Maalox qd/prn, Hgb 11.9 09/19/21             Depression/anxiety, on Amitriptyline, Librium, Fluphenazine, Lorazepam, TSH 1.62 08/15/21             Dementia, supportive care in memory care unit [facility] since Flu 08/2021. CT head was  negative for acute process. Taking Memantine.              Chronic constipation, on prn Dulcolax po/suppository, prn MOM, MiraLax daily/prn             Allergic rhinitis, takes Claritin, Flonase, Mucinex        OP, stopped taking Prolia due to cost, ? Allergic to Fosamax.  her son declined DEXA       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, 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   Vitamin D deficiency    Past Surgical History:  Procedure Laterality Date   ABDOMINAL HYSTERECTOMY     ANAL RECTAL MANOMETRY N/A 07/05/2014   Procedure: ANO RECTAL MANOMETRY;  Surgeon: Leighton Ruff, MD;  Location: [facility];  Service: Endoscopy;  Laterality: N/A;   APPENDECTOMY     CAROTID ENDARTERECTOMY     COLONOSCOPY  2008, 2015   TONSILLECTOMY     UPPER GASTROINTESTINAL ENDOSCOPY  2002    Allergies  Allergen Reactions   Biaxin [Clarithromycin]    Clarithromycin    Doxycycline    Fosamax [Alendronate Sodium]    Geodon [Ziprasidone Hydrochloride]    Lipitor [Atorvastatin Calcium]    Penicillins    Pravachol [Pravastatin Sodium]    Statins    Sulfonamide Derivatives    Zithromax [Azithromycin]    Zocor [Simvastatin]     Allergies as of 05/21/2022       Reactions   Biaxin [clarithromycin]    Clarithromycin    Doxycycline    Fosamax [alendronate Sodium]    Geodon [ziprasidone Hydrochloride]    Lipitor [atorvastatin  Calcium]    Penicillins    Pravachol [pravastatin Sodium]    Statins    Sulfonamide Derivatives    Zithromax [azithromycin]    Zocor [simvastatin]         Medication List        Accurate as of May 21, 2022 11:59 PM. If you have any questions, ask your nurse or doctor.          Tylenol 325 MG Caps Generic drug: Acetaminophen Take 650 mg by mouth in the morning and at bedtime. AS NEEDED FOR PAIN *NOT TO EXCEED 3,000 MG IN 24HRS*   acetaminophen 325 MG tablet Commonly known as: TYLENOL Take 650 mg by mouth every 8 (eight) hours as needed. 8am & 8pm AS NEEDED FOR PAIN *NOT TO EXCEED 3,000 MG IN 24HRS*   alum & mag hydroxide-simeth 474-259-56 MG/5ML suspension Commonly known as: MAALOX/MYLANTA Take 30 mLs by mouth at bedtime. 9pm   alum & mag hydroxide-simeth 200-200-20 MG/5ML suspension Commonly known as: MAALOX/MYLANTA Take 30 mLs by mouth every 4 (four) hours as needed for indigestion or heartburn.   amitriptyline 50 MG tablet Commonly known as: ELAVIL Take 50 mg by mouth at bedtime. 9pm   ARTIFICIAL TEAR OP Apply 1 drop to eye 3 (three) times daily. Both Eyes at 8am, 2pm, and 9pm.   aspirin EC 81 MG tablet Take 81 mg by mouth daily. 9am   bisacodyl 10 MG suppository Commonly known as: DULCOLAX Place 10 mg rectally daily as needed for moderate constipation.   bisacodyl 5 MG EC tablet Commonly known as: DULCOLAX Take 5 mg by mouth daily as needed for moderate constipation.   carbamide peroxide 6.5 % OTIC solution Commonly known as: DEBROX 5 drops every evening.   chlordiazePOXIDE 25 MG capsule Commonly known as: LIBRIUM Take one capsule by mouth at bedtime   diclofenac Sodium 1 % Gel Commonly known as: VOLTAREN Apply 2 g topically 2 (two) times daily as needed. apply to lower back area   famotidine 20 MG tablet Commonly known as: PEPCID Take 20 mg by mouth at bedtime. 8pm   fluPHENAZine 1 MG tablet Commonly known as: PROLIXIN Take 1 mg by  mouth at bedtime. 8pm.   furosemide 20 MG tablet Commonly known as: LASIX Take 20 mg by mouth daily. 8am   guaiFENesin 600 MG 12 hr tablet Commonly known as: MUCINEX Take 600 mg by mouth daily as needed (Cold or cogestion).   HYDROcodone-acetaminophen 5-325 MG  tablet Commonly known as: Norco Take 1 tablet by mouth daily.   hydrocortisone valerate cream 0.2 % Commonly known as: WESTCORT Apply 1 application. topically 2 (two) times daily.   ketoconazole 2 % shampoo Commonly known as: NIZORAL Apply 1 application. topically 2 (two) times a week. Tuesday and Friday   ketoconazole 2 % cream Commonly known as: NIZORAL Apply 1 application. topically 2 (two) times daily as needed.   loratadine 10 MG tablet Commonly known as: CLARITIN Take 10 mg by mouth daily.   LORazepam 0.5 MG tablet Commonly known as: ATIVAN Take 1 tablet (0.5 mg total) by mouth 2 (two) times daily.   magnesium hydroxide 400 MG/5ML suspension Commonly known as: MILK OF MAGNESIA Take 30 mLs by mouth daily as needed.   memantine 5 MG tablet Commonly known as: NAMENDA Take 5 mg by mouth 2 (two) times daily.   MIRALAX PO Take by mouth in the morning. Take  2 capful; Special Instructions: MIX WITH 8oz OF FLUIDS. ** RES. REQUESTED AM DOSAGE GIVEN AT 6:30AM**   polyethylene glycol 17 g packet Commonly known as: MIRALAX / GLYCOLAX Take 17 g by mouth daily as needed.   ORAJEL MT Use as directed in the mouth or throat. 20-0.26-0.15 %; amt: SMALL AMOUNT Three Times A Day - PRN;  mucous membrane Special Instructions: Apply to right inner mouth sore TID PRN [DX: Other specified disorders of teeth and supporting structures]  Every 4 Hours - PRN;may self-administer   OYSTER SHELL PO Take 250 mg by mouth in the morning and at bedtime.   saccharomyces boulardii 250 MG capsule Commonly known as: FLORASTOR Take 250 mg by mouth. 8am   [place] Senior Tabs Take 1 tablet by mouth daily. 500-300-250 mcg [DX: Vitamin  deficiency, unspecified]   spironolactone 25 MG tablet Commonly known as: ALDACTONE Take 12.5 mg by mouth daily.   triamcinolone cream 0.1 % Commonly known as: KENALOG Apply 1 application. topically as needed.        Review of Systems  Constitutional:  Negative for appetite change, fatigue and fever.  HENT:  Positive for hearing loss. Negative for congestion, rhinorrhea and voice change.   Eyes:  Positive for visual disturbance.       Low vision  Respiratory:  Negative for cough and shortness of breath.   Cardiovascular:  Negative for leg swelling.  Gastrointestinal:  Positive for constipation. Negative for abdominal pain.  Genitourinary:  Negative for dysuria, frequency and urgency.  Musculoskeletal:  Positive for arthralgias and gait problem.  Skin:  Negative for color change.  Neurological:  Negative for speech difficulty, weakness and light-headedness.       Memory lapses  Psychiatric/Behavioral:  Positive for confusion. Negative for behavioral problems and sleep disturbance. The patient is not nervous/anxious.     Immunization History  Administered Date(s) Administered   Influenza Split 07/23/2013   Influenza Whole 07/05/2009, 06/26/2018   Influenza-Unspecified 07/29/2014, 06/14/2015, 07/11/2016, 07/15/2017, 06/27/2019, 07/05/2020, 07/13/2021   Moderna SARS-COV2 Booster Vaccination 02/09/2022   Moderna Sars-Covid-2 Vaccination 09/26/2019, 10/24/2019, 08/02/2020   PFIZER(Purple Top)SARS-COV-2 Vaccination 09/26/2019   PPD Test 06/17/2013   Pneumococcal Conjugate-13 07/22/2017   Pneumococcal Polysaccharide-23 05/05/2009   Td 05/05/2009, 07/16/2019   Unspecified SARS-COV-2 Vaccination 02/21/2021, 06/13/2021   Zoster Recombinat (Shingrix) 09/27/2021, 12/05/2021   Zoster, Live 01/09/2011   Pertinent  Health Maintenance Due  Topic Date Due   INFLUENZA VACCINE  04/24/2022   DEXA SCAN  Completed      07/29/2014    4:19 PM 06/04/2017  3:07 PM 06/10/2018    1:48 PM  01/05/2020    5:46 AM 03/10/2021    1:02 PM  [place] in the past year? No Yes No    Was there an injury with Fall?  No     Patient Fall Risk Level    High fall risk Moderate fall risk   Functional Status Survey:    Vitals:   05/21/22 1516  BP: (!) 143/80  Pulse: 60  Resp: 18  Temp: 98.2 F (36.8 C)  SpO2: 97%  Weight: 139 lb (63 kg)  Height: '5\' 5"'$  (1.651 m)   Body mass index is 23.13 kg/m. Physical Exam Constitutional:      Appearance: Normal appearance.  HENT:     Head: Normocephalic.     Nose: Nose normal.     Mouth/Throat:     Mouth: Mucous membranes are moist.  Eyes:     Extraocular Movements: Extraocular movements intact.     Conjunctiva/sclera: Conjunctivae normal.     Pupils: Pupils are equal, round, and reactive to light.     Comments: Legally blind  Cardiovascular:     Rate and Rhythm: Normal rate and regular rhythm.     Heart sounds: No murmur heard.    Comments: DP pulses R+L present.  Pulmonary:     Effort: Pulmonary effort is normal.     Breath sounds: No rales.  Abdominal:     General: Bowel sounds are normal.     Palpations: Abdomen is soft.     Tenderness: There is no abdominal tenderness. There is no right CVA tenderness, left CVA tenderness, guarding or rebound.  Genitourinary:    Comments: From previous examination a pinto bean sized cyst like lump palpated near the clitoris, asymptomatic.  Musculoskeletal:     Cervical back: Normal range of motion and neck supple.     Right lower leg: No edema.     Left lower leg: No edema.  Skin:    General: Skin is warm and dry.  Neurological:     General: No focal deficit present.     Mental Status: She is alert. Mental status is at baseline.     Gait: Gait abnormal.     Comments: Oriented to person, place.   Psychiatric:        Mood and Affect: Mood normal.        Behavior: Behavior normal.        Thought Content: Thought content normal.     Comments: The patient appears at her baseline  mentation during my examination.      Labs reviewed: Recent Labs    08/15/21 0000 09/07/21 0000 09/19/21 0000  NA 139 137 137  K 4.5 4.3 4.8  CL 101 104 102  CO2 27* 23* 26*  BUN '19 18 20  '$ CREATININE 0.9 1.1 1.0  CALCIUM 9.6 9.1 9.3   Recent Labs    08/15/21 0000 09/07/21 0000 09/19/21 0000  AST '25 27 20  '$ ALT '18 18 13  '$ ALKPHOS 146* 121 131*  ALBUMIN 4.1 7.2* 3.8   Recent Labs    08/15/21 0000 09/07/21 0000 09/19/21 0000  WBC 7.7 4.9 10.3  NEUTROABS 4,843.00 3,798.00 6,365.00  HGB 12.2 12.4 11.9*  HCT 36 38 37  PLT 362 263 465*   Lab Results  Component Value Date   TSH 1.62 08/15/2021   Lab Results  Component Value Date   HGBA1C 5.9 02/05/2017   Lab Results  Component Value Date  CHOL 247 (A) 11/21/2018   HDL 72 (A) 11/21/2018   LDLCALC 147 11/21/2018   LDLDIRECT 190.0 03/24/2012   TRIG 150 11/21/2018   CHOLHDL 4 03/24/2012    Significant Diagnostic Results in last 30 days:  No results found.  Assessment/Plan: Constipation c/o constipation, last BM this morning, but hard stool noted. on prn Dulcolax po/suppository, prn MOM, will change  MiraLax to once a day qod alternated with twice a day qod.   Allergic rhinitis takes Claritin, Flonase, Mucinex         Osteoporosis stopped taking Prolia due to cost, ? Allergic to Fosamax.  her son declined DEXA  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[place]) supportive care in memory care unit [facility] since Flu 08/2021. CT head was negative for acute process. Taking Memantine.   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   PVD (peripheral vascular disease) ([place]) on Furosemide, Spironolactone, ASA '81mg'$  qd. Bun/creat 20/1.0 09/19/21    Family/ staff  Communication: plan of care reviewed with the patient and charge nurse.   Labs/tests ordered:  none  Time spend 35 minutes.

## 2022-05-21 NOTE — Assessment & Plan Note (Addendum)
c/o constipation, last BM this morning, but hard stool noted. on prn Dulcolax po/suppository, prn MOM, will change  MiraLax to once a day qod alternated with twice a day qod.

## 2022-05-21 NOTE — Assessment & Plan Note (Signed)
on Furosemide, Spironolactone, ASA '81mg'$  qd. Bun/creat 20/1.0 09/19/21

## 2022-05-21 NOTE — Assessment & Plan Note (Signed)
Her mood is stable,  on Amitriptyline, Librium, Fluphenazine, Lorazepam, TSH 1.62 08/15/21

## 2022-05-21 NOTE — Assessment & Plan Note (Signed)
supportive care in memory care unit [facility] since Flu 08/2021. CT head was negative for acute process. Taking Memantine.  

## 2022-05-21 NOTE — Assessment & Plan Note (Signed)
stable, on Famotidine, Maalox qd/prn, Hgb 11.9 09/19/21

## 2022-05-21 NOTE — Assessment & Plan Note (Signed)
takes Claritin, Flonase, Mucinex       

## 2022-05-24 ENCOUNTER — Encounter: Payer: Self-pay | Admitting: Nurse Practitioner

## 2022-06-01 ENCOUNTER — Non-Acute Institutional Stay (SKILLED_NURSING_FACILITY): Payer: Medicare Other | Admitting: Nurse Practitioner

## 2022-06-01 ENCOUNTER — Encounter: Payer: Self-pay | Admitting: Nurse Practitioner

## 2022-06-01 DIAGNOSIS — K219 Gastro-esophageal reflux disease without esophagitis: Secondary | ICD-10-CM | POA: Diagnosis not present

## 2022-06-01 DIAGNOSIS — F418 Other specified anxiety disorders: Secondary | ICD-10-CM | POA: Diagnosis not present

## 2022-06-01 DIAGNOSIS — F0390 Unspecified dementia without behavioral disturbance: Secondary | ICD-10-CM

## 2022-06-01 DIAGNOSIS — J309 Allergic rhinitis, unspecified: Secondary | ICD-10-CM

## 2022-06-01 DIAGNOSIS — I10 Essential (primary) hypertension: Secondary | ICD-10-CM

## 2022-06-01 DIAGNOSIS — M23201 Derangement of unspecified lateral meniscus due to old tear or injury, left knee: Secondary | ICD-10-CM | POA: Diagnosis not present

## 2022-06-01 DIAGNOSIS — M8000XA Age-related osteoporosis with current pathological fracture, unspecified site, initial encounter for fracture: Secondary | ICD-10-CM

## 2022-06-01 DIAGNOSIS — K5909 Other constipation: Secondary | ICD-10-CM

## 2022-06-01 NOTE — Assessment & Plan Note (Signed)
stopped taking Prolia due to cost, ? Allergic to Fosamax.  her son declined DEXA 

## 2022-06-01 NOTE — Assessment & Plan Note (Signed)
takes Claritin, Flonase, Mucinex       

## 2022-06-01 NOTE — Assessment & Plan Note (Signed)
stable, on Famotidine, Maalox qd/prn, Hgb 13.6 04/26/22

## 2022-06-01 NOTE — Progress Notes (Unsigned)
Location:   [facility] Room Number: [place] of Service:  SNF (31) Provider:  Lequan Dobratz X, NP  [name], MD  Patient Care Team: [name], MD as PCP - General (Internal Medicine) [name], MD (Ophthalmology) [name], MD (Psychiatry) Mack Thurmon X, NP as Nurse Practitioner (Nurse Practitioner) [name], MD as Consulting Physician (Orthopedic Surgery)  Extended Emergency Contact Information Primary Emergency Contact: [name], [place] Work Phone: [phone] Relation: Son Secondary Emergency Contact: [name] [place] Phone: [phone] Relation: Sister  Code Status:  DNR Goals of care: Advanced Directive information    06/01/2022   10:29 AM  Advanced Directives  Does Patient Have a Medical Advance Directive? Yes  Type of Paramedic of [place];Living will;Out of facility DNR (pink MOST or yellow form)  Does patient want to make changes to medical advance directive? No - Patient declined  Copy of [place] in Chart? Yes - validated most recent copy scanned in chart (See row information)  Pre-existing out of facility DNR order (yellow form or pink MOST form) Pink MOST form placed in chart (order not valid for inpatient use);Yellow form placed in chart (order not valid for inpatient use)     Chief Complaint  Patient presents with   Immunizations    Flu vaccine    HPI:  Pt is a 81 y.o. female seen today for medical management of chronic diseases.     HTN, blood pressure is controlled on diuretics. Bun/creat 24/1.2 04/26/22             OA on Tylenol, Norco, ambulates with walker.              GERD, stable, on Famotidine, Maalox qd/prn, Hgb 13.6 04/26/22             Depression/anxiety, on Amitriptyline, Librium, Fluphenazine, Lorazepam, TSH 1.62 08/15/21             Dementia, supportive care in memory care  unit [facility] since Flu 08/2021. CT head was negative for acute process. Taking Memantine.              Chronic constipation, on prn Dulcolax po/suppository, prn MOM, MiraLax qd/bid alternative.              Allergic rhinitis, takes Claritin, Flonase, Mucinex        OP, stopped taking Prolia due to cost, ? Allergic to Fosamax.  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, 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   Vitamin D deficiency    Past Surgical  History:  Procedure Laterality Date   ABDOMINAL HYSTERECTOMY     ANAL RECTAL MANOMETRY N/A 07/05/2014   Procedure: ANO RECTAL MANOMETRY;  Surgeon: Leighton Ruff, MD;  Location: [facility];  Service: Endoscopy;  Laterality: N/A;   APPENDECTOMY     CAROTID ENDARTERECTOMY     COLONOSCOPY  2008, 2015   TONSILLECTOMY     UPPER GASTROINTESTINAL ENDOSCOPY  2002    Allergies  Allergen Reactions   Biaxin [Clarithromycin]    Clarithromycin    Doxycycline    Fosamax [Alendronate Sodium]    Geodon [Ziprasidone Hydrochloride]    Lipitor [Atorvastatin Calcium]    Penicillins    Pravachol [Pravastatin Sodium]    Statins    Sulfonamide Derivatives    Zithromax [Azithromycin]    Zocor [Simvastatin]     Allergies as of 06/01/2022       Reactions   Biaxin [clarithromycin]    Clarithromycin    Doxycycline    Fosamax [alendronate Sodium]    Geodon  [ziprasidone Hydrochloride]    Lipitor [atorvastatin Calcium]    Penicillins    Pravachol [pravastatin Sodium]    Statins    Sulfonamide Derivatives    Zithromax [azithromycin]    Zocor [simvastatin]         Medication List        Accurate as of June 01, 2022 11:59 PM. If you have any questions, ask your nurse or doctor.          Tylenol 325 MG Caps Generic drug: Acetaminophen Take 650 mg by mouth in the morning and at bedtime. AS NEEDED FOR PAIN *NOT TO EXCEED 3,000 MG IN 24HRS*   acetaminophen 325 MG tablet Commonly known as: TYLENOL Take 650 mg by mouth every 8 (eight) hours as needed. 8am & 8pm AS NEEDED FOR PAIN *NOT TO EXCEED 3,000 MG IN 24HRS*   alum & mag hydroxide-simeth 161-096-04 MG/5ML suspension Commonly known as: MAALOX/MYLANTA Take 30 mLs by mouth at bedtime. 9pm   alum & mag hydroxide-simeth 200-200-20 MG/5ML suspension Commonly known as: MAALOX/MYLANTA Take 30 mLs by mouth every 4 (four) hours as needed for indigestion or heartburn.   amitriptyline 50 MG tablet Commonly known as: ELAVIL Take 50 mg by mouth at bedtime. 9pm   ARTIFICIAL TEAR OP Apply 1 drop to eye 3 (three) times daily. Both Eyes at 8am, 2pm, and 9pm.   aspirin EC 81 MG tablet Take 81 mg by mouth daily. 9am   bisacodyl 10 MG suppository Commonly known as: DULCOLAX Place 10 mg rectally daily as needed for moderate constipation.   bisacodyl 5 MG EC tablet Commonly known as: DULCOLAX Take 5 mg by mouth daily as needed for moderate constipation.   carbamide peroxide 6.5 % OTIC solution Commonly known as: DEBROX 5 drops as needed.   chlordiazePOXIDE 25 MG capsule Commonly known as: LIBRIUM Take one capsule by mouth at bedtime   diclofenac Sodium 1 % Gel Commonly known as: VOLTAREN Apply 2 g topically 2 (two) times daily as needed. apply to lower back area   famotidine 20 MG tablet Commonly known as: PEPCID Take 20 mg by mouth at bedtime. 8pm   fluPHENAZine 1 MG  tablet Commonly known as: PROLIXIN Take 1 mg by mouth at bedtime. 8pm.   furosemide 20 MG tablet Commonly known as: LASIX Take 20 mg by mouth daily. 8am   guaiFENesin 600 MG 12 hr tablet Commonly known as: MUCINEX Take 600 mg by mouth daily as needed (Cold or cogestion).   HYDROcodone-acetaminophen  5-325 MG tablet Commonly known as: Norco Take 1 tablet by mouth daily.   hydrocortisone valerate cream 0.2 % Commonly known as: WESTCORT Apply 1 application. topically 2 (two) times daily.   ketoconazole 2 % shampoo Commonly known as: NIZORAL Apply 1 application. topically 2 (two) times a week. Tuesday and Friday   ketoconazole 2 % cream Commonly known as: NIZORAL Apply 1 application. topically 2 (two) times daily as needed.   loratadine 10 MG tablet Commonly known as: CLARITIN Take 10 mg by mouth daily.   LORazepam 0.5 MG tablet Commonly known as: ATIVAN Take 1 tablet (0.5 mg total) by mouth 2 (two) times daily.   magnesium hydroxide 400 MG/5ML suspension Commonly known as: MILK OF MAGNESIA Take 30 mLs by mouth daily as needed.   memantine 10 MG tablet Commonly known as: NAMENDA Take 10 mg by mouth 2 (two) times daily.   MIRALAX PO Take by mouth in the morning. Take  2 capful; Special Instructions: MIX WITH 8oz OF FLUIDS. ** RES. REQUESTED AM DOSAGE GIVEN AT 6:30AM** What changed: Another medication with the same name was removed. Continue taking this medication, and follow the directions you see here. Changed by: Starr Engel X Adonai Helzer, NP   ORAJEL MT Use as directed in the mouth or throat. 20-0.26-0.15 %; amt: SMALL AMOUNT Three Times A Day - PRN;  mucous membrane Special Instructions: Apply to right inner mouth sore TID PRN [DX: Other specified disorders of teeth and supporting structures]  Every 4 Hours - PRN;may self-administer   OYSTER SHELL PO Take 250 mg by mouth in the morning and at bedtime.   saccharomyces boulardii 250 MG capsule Commonly known as:  FLORASTOR Take 250 mg by mouth. 8am   [facility] Tabs Take 1 tablet by mouth daily. 500-300-250 mcg [DX: Vitamin deficiency, unspecified]   spironolactone 25 MG tablet Commonly known as: ALDACTONE Take 12.5 mg by mouth daily.   triamcinolone cream 0.1 % Commonly known as: KENALOG Apply 1 application. topically as needed.        Review of Systems  Constitutional:  Negative for appetite change, fatigue and fever.  HENT:  Positive for hearing loss. Negative for congestion, rhinorrhea and voice change.   Eyes:  Positive for visual disturbance.       Low vision  Respiratory:  Negative for cough and shortness of breath.   Cardiovascular:  Negative for leg swelling.  Gastrointestinal:  Negative for abdominal pain and constipation.  Genitourinary:  Negative for dysuria, frequency and urgency.  Musculoskeletal:  Positive for arthralgias and gait problem.  Skin:  Negative for color change.  Neurological:  Negative for speech difficulty, weakness and light-headedness.       Memory lapses  Psychiatric/Behavioral:  Positive for confusion. Negative for behavioral problems and sleep disturbance. The patient is not nervous/anxious.     Immunization History  Administered Date(s) Administered   Influenza Split 07/23/2013   Influenza Whole 07/05/2009, 06/26/2018   Influenza-Unspecified 07/29/2014, 06/14/2015, 07/11/2016, 07/15/2017, 06/27/2019, 07/05/2020, 07/13/2021   Moderna SARS-COV2 Booster Vaccination 02/09/2022   Moderna Sars-Covid-2 Vaccination 09/26/2019, 10/24/2019, 08/02/2020   PFIZER(Purple Top)SARS-COV-2 Vaccination 09/26/2019   PPD Test 06/17/2013   Pneumococcal Conjugate-13 07/22/2017   Pneumococcal Polysaccharide-23 05/05/2009   Td 05/05/2009, 07/16/2019   Unspecified SARS-COV-2 Vaccination 02/21/2021, 06/13/2021   Zoster Recombinat (Shingrix) 09/27/2021, 12/05/2021   Zoster, Live 01/09/2011   Pertinent  Health Maintenance Due  Topic Date Due   INFLUENZA VACCINE   04/24/2022   DEXA SCAN  Completed  07/29/2014    4:19 PM 06/04/2017    3:07 PM 06/10/2018    1:48 PM 01/05/2020    5:46 AM 03/10/2021    1:02 PM  Fall Risk  Falls in the past year? No Yes No    Was there an injury with Fall?  No     Patient Fall Risk Level    High fall risk Moderate fall risk   Functional Status Survey:    Vitals:   06/01/22 1040  BP: 107/79  Pulse: 75  Resp: 18  Temp: 98 F (36.7 C)  SpO2: 97%   There is no height or weight on file to calculate BMI. Physical Exam Constitutional:      Appearance: Normal appearance.  HENT:     Head: Normocephalic.     Nose: Nose normal.     Mouth/Throat:     Mouth: Mucous membranes are moist.  Eyes:     Extraocular Movements: Extraocular movements intact.     Conjunctiva/sclera: Conjunctivae normal.     Pupils: Pupils are equal, round, and reactive to light.     Comments: Legally blind  Cardiovascular:     Rate and Rhythm: Normal rate and regular rhythm.     Heart sounds: No murmur heard.    Comments: DP pulses R+L present.  Pulmonary:     Effort: Pulmonary effort is normal.     Breath sounds: No rales.  Abdominal:     General: Bowel sounds are normal.     Palpations: Abdomen is soft.     Tenderness: There is no abdominal tenderness. There is no right CVA tenderness, left CVA tenderness, guarding or rebound.  Genitourinary:    Comments: From previous examination a pinto bean sized cyst like lump palpated near the clitoris, asymptomatic.  Musculoskeletal:     Cervical back: Normal range of motion and neck supple.     Right lower leg: No edema.     Left lower leg: No edema.  Skin:    General: Skin is warm and dry.  Neurological:     General: No focal deficit present.     Mental Status: She is alert. Mental status is at baseline.     Gait: Gait abnormal.     Comments: Oriented to person, place.   Psychiatric:        Mood and Affect: Mood normal.        Behavior: Behavior normal.        Thought Content:  Thought content normal.     Comments: The patient appears at her baseline mentation during my examination.      Labs reviewed: Recent Labs    08/15/21 0000 09/07/21 0000 09/19/21 0000 04/26/22 0000  NA 139 137 137 139  K 4.5 4.3 4.8 4.4  CL 101 104 102 101  CO2 27* 23* 26*  --   BUN '19 18 20 '$ 24*  CREATININE 0.9 1.1 1.0 1.2*  CALCIUM 9.6 9.1 9.3 9.7   Recent Labs    09/07/21 0000 09/19/21 0000 04/26/22 0000  AST '27 20 23  '$ ALT '18 13 16  '$ ALKPHOS 121 131* 130*  ALBUMIN 7.2* 3.8 4.6   Recent Labs    09/07/21 0000 09/19/21 0000 04/26/22 0000  WBC 4.9 10.3 5.9  NEUTROABS 3,798.00 6,365.00 3,039.00  HGB 12.4 11.9* 13.6  HCT 38 37 40  PLT 263 465* 353   Lab Results  Component Value Date   TSH 1.62 08/15/2021   Lab Results  Component Value Date  HGBA1C 5.9 02/05/2017   Lab Results  Component Value Date   CHOL 247 (A) 11/21/2018   HDL 72 (A) 11/21/2018   LDLCALC 147 11/21/2018   LDLDIRECT 190.0 03/24/2012   TRIG 150 11/21/2018   CHOLHDL 4 03/24/2012    Significant Diagnostic Results in last 30 days:  No results found.  Assessment/Plan Essential hypertension  blood pressure is controlled on diuretics. Bun/creat 24/1.2 04/26/22  Tear of meniscus of left knee on Tylenol, Norco, ambulates with walker.   GERD (gastroesophageal reflux disease)  stable, on Famotidine, Maalox qd/prn, Hgb 13.6 04/26/22  Depression with anxiety Her mood is stable, on Amitriptyline, Librium, Fluphenazine, Lorazepam, TSH 1.62 08/15/21  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[place]) supportive care in memory care unit [facility] since Flu 08/2021. CT head was negative for acute process. Taking Memantine.   Constipation Stable, Chronic constipation, on prn Dulcolax po/suppository, prn MOM, MiraLax qd/bid alternative.   Allergic rhinitis takes Claritin, Flonase, Mucinex         Osteoporosis stopped taking Prolia due to cost, ? Allergic to Fosamax.  her son declined DEXA       Family/ staff  Communication: plan of care reviewed with the patient and charge nurse.   Labs/tests ordered:  none  Time spend 35 minutes.

## 2022-06-01 NOTE — Assessment & Plan Note (Signed)
Her mood is stable, on Amitriptyline, Librium, Fluphenazine, Lorazepam, TSH 1.62 08/15/21

## 2022-06-01 NOTE — Assessment & Plan Note (Signed)
Stable, Chronic constipation, on prn Dulcolax po/suppository, prn MOM, MiraLax qd/bid alternative.

## 2022-06-01 NOTE — Assessment & Plan Note (Signed)
supportive care in memory care unit [facility] since Flu 08/2021. CT head was negative for acute process. Taking Memantine.  

## 2022-06-01 NOTE — Assessment & Plan Note (Signed)
blood pressure is controlled on diuretics. Bun/creat 24/1.2 04/26/22

## 2022-06-01 NOTE — Assessment & Plan Note (Signed)
on Tylenol, Norco, ambulates with walker.

## 2022-06-04 ENCOUNTER — Non-Acute Institutional Stay (SKILLED_NURSING_FACILITY): Payer: Medicare Other | Admitting: Nurse Practitioner

## 2022-06-04 ENCOUNTER — Encounter: Payer: Self-pay | Admitting: Nurse Practitioner

## 2022-06-04 DIAGNOSIS — R21 Rash and other nonspecific skin eruption: Secondary | ICD-10-CM

## 2022-06-04 DIAGNOSIS — K219 Gastro-esophageal reflux disease without esophagitis: Secondary | ICD-10-CM | POA: Diagnosis not present

## 2022-06-04 DIAGNOSIS — F418 Other specified anxiety disorders: Secondary | ICD-10-CM

## 2022-06-04 DIAGNOSIS — I10 Essential (primary) hypertension: Secondary | ICD-10-CM

## 2022-06-04 DIAGNOSIS — F0390 Unspecified dementia without behavioral disturbance: Secondary | ICD-10-CM

## 2022-06-04 DIAGNOSIS — M159 Polyosteoarthritis, unspecified: Secondary | ICD-10-CM

## 2022-06-04 DIAGNOSIS — K5909 Other constipation: Secondary | ICD-10-CM

## 2022-06-04 DIAGNOSIS — J309 Allergic rhinitis, unspecified: Secondary | ICD-10-CM

## 2022-06-04 NOTE — Assessment & Plan Note (Signed)
a group of macules right neck above the clavicle bone, denied itching, burning, no blistering. Contact precaution, apply existing prn Ketoconazole/Hydrocortisone cream bid x 10 days. Observe.

## 2022-06-04 NOTE — Assessment & Plan Note (Signed)
supportive care in memory care unit [facility] since Flu 08/2021. CT head was negative for acute process. Taking Memantine.  

## 2022-06-04 NOTE — Assessment & Plan Note (Signed)
Stable, takes Claritin, Flonase, Mucinex

## 2022-06-04 NOTE — Assessment & Plan Note (Signed)
Stable, on prn Dulcolax po/suppository, prn MOM, MiraLax qd/bid alternative.  

## 2022-06-04 NOTE — Assessment & Plan Note (Signed)
blood pressure is controlled on diuretics. Bun/creat 24/1.2 04/26/22

## 2022-06-04 NOTE — Progress Notes (Unsigned)
Location:  [place] Room Number: 101-A Place of Service:  SNF [phone]) Provider:  Mayco Walrond Samule Dry, MD  Patient Care Team: [name], MD as PCP - General (Internal Medicine) [name], MD (Ophthalmology) [name], MD (Psychiatry) Keylie Beavers X, NP as Nurse Practitioner (Nurse Practitioner) [name], MD as Consulting Physician (Orthopedic Surgery)  Extended Emergency Contact Information Primary Emergency Contact: [name], [place] Work Phone: [phone] Relation: Son Secondary Emergency Contact: [name] [place] Phone: [phone] Relation: Sister  Code Status:  DNR Goals of care: Advanced Directive information    06/04/2022    2:32 PM  Advanced Directives  Does Patient Have a Medical Advance Directive? Yes  Type of Paramedic of [place];Living will;Out of facility DNR (pink MOST or yellow form)  Does patient want to make changes to medical advance directive? No - Patient declined  Copy of [place] in Chart? Yes - validated most recent copy scanned in chart (See row information)  Pre-existing out of facility DNR order (yellow form or pink MOST form) Pink MOST/Yellow Form most recent copy in chart - Physician notified to receive inpatient order     Chief Complaint  Patient presents with   Acute Visit    Right neck rash    HPI:  Pt is a 81 y.o. female seen today for an acute visit for a group of macules right neck above the clavicle bone, denied itching, burning, no blistering.    HTN, blood pressure is controlled on diuretics. Bun/creat 24/1.2 04/26/22             OA on Tylenol, Norco, ambulates with walker.              GERD, stable, on Famotidine, Maalox qd/prn, Hgb 13.6 04/26/22             Depression/anxiety, on Amitriptyline, Librium, Fluphenazine, Lorazepam, TSH 1.62 08/15/21              Dementia, supportive care in memory care unit [facility] since Flu 08/2021. CT head was negative for acute process. Taking Memantine.              Chronic constipation, on prn Dulcolax po/suppository, prn MOM, MiraLax qd/bid alternative.              Allergic rhinitis, takes Claritin, Flonase, Mucinex        OP, stopped taking Prolia due to cost, ? Allergic to Fosamax.  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, 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   Vitamin  D deficiency    Past Surgical History:  Procedure Laterality Date   ABDOMINAL HYSTERECTOMY     ANAL RECTAL MANOMETRY N/A 07/05/2014   Procedure: ANO RECTAL MANOMETRY;  Surgeon: Leighton Ruff, MD;  Location: [facility];  Service: Endoscopy;  Laterality: N/A;   APPENDECTOMY     CAROTID ENDARTERECTOMY     COLONOSCOPY  2008, 2015   TONSILLECTOMY     UPPER GASTROINTESTINAL ENDOSCOPY  2002    Allergies  Allergen Reactions   Biaxin [Clarithromycin]    Clarithromycin    Doxycycline    Fosamax [Alendronate Sodium]    Geodon [Ziprasidone Hydrochloride]    Lipitor [Atorvastatin Calcium]    Penicillins    Pravachol [Pravastatin Sodium]    Statins    Sulfonamide Derivatives    Zithromax [Azithromycin]    Zocor [Simvastatin]     Outpatient Encounter Medications as of 06/04/2022  Medication Sig   Acetaminophen (TYLENOL) 325 MG CAPS Take 650 mg  by mouth in the morning and at bedtime. AS NEEDED FOR PAIN *NOT TO EXCEED 3,000 MG IN 24HRS*   acetaminophen (TYLENOL) 325 MG tablet Take 650 mg by mouth every 8 (eight) hours as needed. 8am & 8pm AS NEEDED FOR PAIN *NOT TO EXCEED 3,000 MG IN 24HRS*   alum & mag hydroxide-simeth (MAALOX/MYLANTA) 200-200-20 MG/5ML suspension Take 30 mLs by mouth at bedtime. 9pm   alum & mag hydroxide-simeth (MAALOX/MYLANTA) 200-200-20 MG/5ML suspension Take 30 mLs by mouth every 4 (four) hours as needed for indigestion or heartburn.   amitriptyline (ELAVIL) 50 MG tablet Take 50 mg by mouth at bedtime. 9pm   ARTIFICIAL TEAR OP Apply 1 drop to eye 3 (three) times daily. Both Eyes at 8am, 2pm, and 9pm.   aspirin 81 MG EC tablet Take 81 mg by mouth daily. 9am   Benzocaine (ORAJEL MT) Use as directed in the mouth or throat. 20-0.26-0.15 %; amt: SMALL AMOUNT Three Times A Day - PRN;  mucous membrane Special Instructions: Apply to right inner mouth sore TID PRN [DX: Other specified disorders of teeth and supporting structures]  Every 4 Hours - PRN;may self-administer   bisacodyl (DULCOLAX) 10 MG suppository Place 10 mg rectally daily as needed for moderate constipation.   bisacodyl (DULCOLAX) 5 MG EC tablet Take 5 mg by mouth daily as needed for moderate constipation.   carbamide peroxide (DEBROX) 6.5 % OTIC solution 5 drops as needed.   chlordiazePOXIDE (LIBRIUM) 25 MG capsule Take one capsule by mouth at bedtime   diclofenac Sodium (VOLTAREN) 1 % GEL Apply 2 g topically 2 (two) times daily as needed. apply to lower back area   famotidine (PEPCID) 20 MG tablet Take 20 mg by mouth at bedtime. 8pm   fluPHENAZine (PROLIXIN) 1 MG tablet Take 1 mg by mouth at bedtime. 8pm.   furosemide (LASIX) 20 MG tablet Take 20 mg by mouth daily. 8am   guaiFENesin (MUCINEX) 600 MG 12 hr tablet Take 600 mg by mouth daily as needed (Cold or cogestion).   HYDROcodone-acetaminophen (NORCO) 5-325 MG tablet Take 1 tablet by mouth daily.    hydrocortisone valerate cream (WESTCORT) 0.2 % Apply 1 application  topically 2 (two) times daily. Twice A Day; MORNING 07:00 AM - 10:00 AM, EVENING 07:00 PM - 10:00 PM   ketoconazole (NIZORAL) 2 % cream Apply 1 application  topically 2 (two) times daily as needed. Twice A Day; MORNING 07:00 AM - 10:00 AM, EVENING 07:00 PM - 10:00 PM   ketoconazole (NIZORAL) 2 % shampoo Apply 1  application. topically 2 (two) times a week. Tuesday and Friday   loratadine (CLARITIN) 10 MG tablet Take 10 mg by mouth daily.   LORazepam (ATIVAN) 0.5 MG tablet Take 1 tablet (0.5 mg total) by mouth 2 (two) times daily.   magnesium hydroxide (MILK OF MAGNESIA) 400 MG/5ML suspension Take 30 mLs by mouth daily as needed.    memantine (NAMENDA) 10 MG tablet Take 10 mg by mouth 2 (two) times daily.   Multiple Vitamins-Minerals (SENTRY SENIOR) TABS Take 1 tablet by mouth daily. 500-300-250 mcg [DX: Vitamin deficiency, unspecified]   OYSTER SHELL PO Take 250 mg by mouth in the morning and at bedtime.   Polyethylene Glycol 3350 (MIRALAX PO) Take by mouth in the morning. Take  2 capful; Special Instructions: MIX WITH 8oz OF FLUIDS. ** RES. REQUESTED AM DOSAGE GIVEN AT 6:30AM**   saccharomyces boulardii (FLORASTOR) 250 MG capsule Take 250 mg by mouth. 8am   spironolactone (ALDACTONE) 25 MG tablet Take 12.5 mg by mouth daily.   triamcinolone cream (KENALOG) 0.1 % Apply 1 application. topically as needed.   No facility-administered encounter medications on file as of 06/04/2022.    Review of Systems  Constitutional:  Negative for appetite change, fatigue and fever.  HENT:  Positive for hearing loss. Negative for congestion, rhinorrhea and voice change.   Eyes:  Positive for visual disturbance.       Low vision  Respiratory:  Negative for cough and shortness of breath.   Cardiovascular:  Negative for leg swelling.  Gastrointestinal:  Negative for abdominal pain and constipation.  Genitourinary:  Negative for dysuria, frequency  and urgency.  Musculoskeletal:  Positive for arthralgias and gait problem.  Skin:  Positive for rash.  Neurological:  Negative for speech difficulty, weakness and light-headedness.       Memory lapses  Psychiatric/Behavioral:  Positive for confusion. Negative for behavioral problems and sleep disturbance. The patient is not nervous/anxious.     Immunization History  Administered Date(s) Administered   Influenza Split 07/23/2013   Influenza Whole 07/05/2009, 06/26/2018   Influenza-Unspecified 07/29/2014, 06/14/2015, 07/11/2016, 07/15/2017, 06/27/2019, 07/05/2020, 07/13/2021   Moderna SARS-COV2 Booster Vaccination 02/09/2022   Moderna Sars-Covid-2 Vaccination 09/26/2019, 10/24/2019, 08/02/2020   PFIZER(Purple Top)SARS-COV-2 Vaccination 09/26/2019   PPD Test 06/17/2013   Pneumococcal Conjugate-13 07/22/2017   Pneumococcal Polysaccharide-23 05/05/2009   Td 05/05/2009, 07/16/2019   Unspecified SARS-COV-2 Vaccination 02/21/2021, 06/13/2021   Zoster Recombinat (Shingrix) 09/27/2021, 12/05/2021   Zoster, Live 01/09/2011   Pertinent  Health Maintenance Due  Topic Date Due   INFLUENZA VACCINE  04/24/2022   DEXA SCAN  Completed      07/29/2014    4:19 PM 06/04/2017    3:07 PM 06/10/2018    1:48 PM 01/05/2020    5:46 AM 03/10/2021    1:02 PM  [place] in the past year? No Yes No    Was there an injury with Fall?  No     Patient Fall Risk Level    High fall risk Moderate fall risk   Functional Status Survey:    Vitals:   06/04/22 1431  BP: (!) 140/90  Pulse: 66  Resp: 20  Temp: 98 F (36.7 C)  SpO2: 97%  Weight: 134 lb (60.8 kg)  Height: '5\' 5"'$  (1.651 m)   Body mass index is 22.3 kg/m. Physical Exam Constitutional:      Appearance: Normal appearance.  HENT:     Head: Normocephalic.     Nose: Nose normal.  Mouth/Throat:     Mouth: Mucous membranes are moist.  Eyes:     Extraocular Movements: Extraocular movements intact.     Conjunctiva/sclera: Conjunctivae  normal.     Pupils: Pupils are equal, round, and reactive to light.     Comments: Legally blind  Cardiovascular:     Rate and Rhythm: Normal rate and regular rhythm.     Heart sounds: No murmur heard.    Comments: DP pulses R+L present.  Pulmonary:     Effort: Pulmonary effort is normal.     Breath sounds: No rales.  Abdominal:     General: Bowel sounds are normal.     Palpations: Abdomen is soft.     Tenderness: There is no abdominal tenderness. There is no right CVA tenderness, left CVA tenderness, guarding or rebound.  Genitourinary:    Comments: From previous examination a pinto bean sized cyst like lump palpated near the clitoris, asymptomatic.  Musculoskeletal:     Cervical back: Normal range of motion and neck supple.     Right lower leg: No edema.     Left lower leg: No edema.  Skin:    General: Skin is warm and dry.     Findings: Rash present.     Comments: Group of 5-6 macules right neck above the right clavicle bone, no itching, burning, or blistering.   Neurological:     General: No focal deficit present.     Mental Status: She is alert. Mental status is at baseline.     Gait: Gait abnormal.     Comments: Oriented to person, place.   Psychiatric:        Mood and Affect: Mood normal.        Behavior: Behavior normal.        Thought Content: Thought content normal.     Comments: The patient appears at her baseline mentation during my examination.      Labs reviewed: Recent Labs    08/15/21 0000 09/07/21 0000 09/19/21 0000 04/26/22 0000  NA 139 137 137 139  K 4.5 4.3 4.8 4.4  CL 101 104 102 101  CO2 27* 23* 26*  --   BUN '19 18 20 '$ 24*  CREATININE 0.9 1.1 1.0 1.2*  CALCIUM 9.6 9.1 9.3 9.7   Recent Labs    09/07/21 0000 09/19/21 0000 04/26/22 0000  AST '27 20 23  '$ ALT '18 13 16  '$ ALKPHOS 121 131* 130*  ALBUMIN 7.2* 3.8 4.6   Recent Labs    09/07/21 0000 09/19/21 0000 04/26/22 0000  WBC 4.9 10.3 5.9  NEUTROABS 3,798.00 6,365.00 3,039.00  HGB 12.4  11.9* 13.6  HCT 38 37 40  PLT 263 465* 353   Lab Results  Component Value Date   TSH 1.62 08/15/2021   Lab Results  Component Value Date   HGBA1C 5.9 02/05/2017   Lab Results  Component Value Date   CHOL 247 (A) 11/21/2018   HDL 72 (A) 11/21/2018   LDLCALC 147 11/21/2018   LDLDIRECT 190.0 03/24/2012   TRIG 150 11/21/2018   CHOLHDL 4 03/24/2012    Significant Diagnostic Results in last 30 days:  No results found.  Assessment/Plan Rash a group of macules right neck above the clavicle bone, denied itching, burning, no blistering. Contact precaution, apply existing prn Ketoconazole/Hydrocortisone cream bid x 10 days. Observe.   Essential hypertension blood pressure is controlled on diuretics. 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   GERD (gastroesophageal reflux disease) stable, on Famotidine, Maalox qd/prn, Hgb 13.6 04/26/22  Depression with anxiety Her mood is stable, on Amitriptyline, Librium, Fluphenazine, Lorazepam, TSH 1.62 08/15/21  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[place]) supportive care in memory care unit [facility] since Flu 08/2021. CT head was negative for acute process. Taking Memantine.   Constipation Stable, on prn Dulcolax po/suppository, prn MOM, MiraLax qd/bid alternative.   Allergic rhinitis Stable, takes Claritin, Flonase, Mucinex            Family/ staff Communication: plan of care reviewed with the patient and charge nurse.   Labs/tests ordered:  none  Time spend 35 minutes.

## 2022-06-04 NOTE — Assessment & Plan Note (Signed)
stable, on Famotidine, Maalox qd/prn, Hgb 13.6 04/26/22

## 2022-06-04 NOTE — Assessment & Plan Note (Signed)
Her mood is stable, on Amitriptyline, Librium, Fluphenazine, Lorazepam, TSH 1.62 08/15/21

## 2022-06-04 NOTE — Assessment & Plan Note (Signed)
on Tylenol, Norco, ambulates with walker.

## 2022-06-05 ENCOUNTER — Encounter: Payer: Self-pay | Admitting: Nurse Practitioner

## 2022-06-07 ENCOUNTER — Observation Stay (HOSPITAL_COMMUNITY): Payer: Medicare Other

## 2022-06-07 ENCOUNTER — Other Ambulatory Visit: Payer: Self-pay

## 2022-06-07 ENCOUNTER — Observation Stay (HOSPITAL_COMMUNITY)
Admission: EM | Admit: 2022-06-07 | Discharge: 2022-06-09 | Disposition: A | Discharge status: Skilled Nursing Facility | Payer: Medicare Other | Attending: Student | Admitting: Student

## 2022-06-07 ENCOUNTER — Emergency Department (HOSPITAL_COMMUNITY): Payer: Medicare Other

## 2022-06-07 DIAGNOSIS — Z20822 Contact with and (suspected) exposure to covid-19: Secondary | ICD-10-CM | POA: Diagnosis not present

## 2022-06-07 DIAGNOSIS — I493 Ventricular premature depolarization: Secondary | ICD-10-CM | POA: Diagnosis not present

## 2022-06-07 DIAGNOSIS — R4789 Other speech disturbances: Secondary | ICD-10-CM | POA: Insufficient documentation

## 2022-06-07 DIAGNOSIS — N189 Chronic kidney disease, unspecified: Secondary | ICD-10-CM | POA: Diagnosis not present

## 2022-06-07 DIAGNOSIS — I739 Peripheral vascular disease, unspecified: Secondary | ICD-10-CM | POA: Insufficient documentation

## 2022-06-07 DIAGNOSIS — R4182 Altered mental status, unspecified: Secondary | ICD-10-CM

## 2022-06-07 DIAGNOSIS — I129 Hypertensive chronic kidney disease with stage 1 through stage 4 chronic kidney disease, or unspecified chronic kidney disease: Secondary | ICD-10-CM | POA: Insufficient documentation

## 2022-06-07 DIAGNOSIS — I2510 Atherosclerotic heart disease of native coronary artery without angina pectoris: Secondary | ICD-10-CM | POA: Insufficient documentation

## 2022-06-07 DIAGNOSIS — K219 Gastro-esophageal reflux disease without esophagitis: Secondary | ICD-10-CM | POA: Diagnosis present

## 2022-06-07 DIAGNOSIS — J45909 Unspecified asthma, uncomplicated: Secondary | ICD-10-CM | POA: Insufficient documentation

## 2022-06-07 DIAGNOSIS — Z79899 Other long term (current) drug therapy: Secondary | ICD-10-CM | POA: Diagnosis not present

## 2022-06-07 DIAGNOSIS — Z66 Do not resuscitate: Secondary | ICD-10-CM | POA: Insufficient documentation

## 2022-06-07 DIAGNOSIS — R404 Transient alteration of awareness: Secondary | ICD-10-CM

## 2022-06-07 DIAGNOSIS — F411 Generalized anxiety disorder: Secondary | ICD-10-CM | POA: Diagnosis not present

## 2022-06-07 DIAGNOSIS — E860 Dehydration: Secondary | ICD-10-CM

## 2022-06-07 DIAGNOSIS — F0390 Unspecified dementia without behavioral disturbance: Secondary | ICD-10-CM | POA: Diagnosis present

## 2022-06-07 DIAGNOSIS — R569 Unspecified convulsions: Secondary | ICD-10-CM | POA: Diagnosis not present

## 2022-06-07 DIAGNOSIS — I672 Cerebral atherosclerosis: Secondary | ICD-10-CM | POA: Diagnosis not present

## 2022-06-07 DIAGNOSIS — G9340 Encephalopathy, unspecified: Secondary | ICD-10-CM | POA: Diagnosis not present

## 2022-06-07 DIAGNOSIS — I10 Essential (primary) hypertension: Secondary | ICD-10-CM | POA: Diagnosis not present

## 2022-06-07 DIAGNOSIS — Z9189 Other specified personal risk factors, not elsewhere classified: Secondary | ICD-10-CM

## 2022-06-07 DIAGNOSIS — R471 Dysarthria and anarthria: Secondary | ICD-10-CM | POA: Diagnosis not present

## 2022-06-07 DIAGNOSIS — F418 Other specified anxiety disorders: Secondary | ICD-10-CM | POA: Diagnosis present

## 2022-06-07 DIAGNOSIS — R2990 Unspecified symptoms and signs involving the nervous system: Secondary | ICD-10-CM | POA: Diagnosis not present

## 2022-06-07 DIAGNOSIS — R531 Weakness: Secondary | ICD-10-CM

## 2022-06-07 DIAGNOSIS — F323 Major depressive disorder, single episode, severe with psychotic features: Secondary | ICD-10-CM | POA: Diagnosis present

## 2022-06-07 DIAGNOSIS — Z7982 Long term (current) use of aspirin: Secondary | ICD-10-CM | POA: Insufficient documentation

## 2022-06-07 LAB — RAPID URINE DRUG SCREEN, HOSP PERFORMED
Amphetamines: NOT DETECTED
Barbiturates: NOT DETECTED
Benzodiazepines: POSITIVE — AB
Cocaine: NOT DETECTED
Opiates: NOT DETECTED
Tetrahydrocannabinol: NOT DETECTED

## 2022-06-07 LAB — I-STAT CHEM 8, ED
BUN: 27 mg/dL — ABNORMAL HIGH (ref 8–23)
Calcium, Ion: 1.08 mmol/L — ABNORMAL LOW (ref 1.15–1.40)
Chloride: 103 mmol/L (ref 98–111)
Creatinine, Ser: 1 mg/dL (ref 0.44–1.00)
Glucose, Bld: 97 mg/dL (ref 70–99)
HCT: 48 % — ABNORMAL HIGH (ref 36.0–46.0)
Hemoglobin: 16.3 g/dL — ABNORMAL HIGH (ref 12.0–15.0)
Potassium: 5 mmol/L (ref 3.5–5.1)
Sodium: 138 mmol/L (ref 135–145)
TCO2: 27 mmol/L (ref 22–32)

## 2022-06-07 LAB — URINALYSIS, ROUTINE W REFLEX MICROSCOPIC
Bilirubin Urine: NEGATIVE
Glucose, UA: NEGATIVE mg/dL
Hgb urine dipstick: NEGATIVE
Ketones, ur: NEGATIVE mg/dL
Leukocytes,Ua: NEGATIVE
Nitrite: NEGATIVE
Protein, ur: NEGATIVE mg/dL
Specific Gravity, Urine: 1.005 (ref 1.005–1.030)
pH: 8 (ref 5.0–8.0)

## 2022-06-07 LAB — DIFFERENTIAL
Abs Immature Granulocytes: 0.02 10*3/uL (ref 0.00–0.07)
Basophils Absolute: 0.1 10*3/uL (ref 0.0–0.1)
Basophils Relative: 1 %
Eosinophils Absolute: 0.2 10*3/uL (ref 0.0–0.5)
Eosinophils Relative: 3 %
Immature Granulocytes: 0 %
Lymphocytes Relative: 38 %
Lymphs Abs: 2.4 10*3/uL (ref 0.7–4.0)
Monocytes Absolute: 0.6 10*3/uL (ref 0.1–1.0)
Monocytes Relative: 9 %
Neutro Abs: 3.1 10*3/uL (ref 1.7–7.7)
Neutrophils Relative %: 49 %

## 2022-06-07 LAB — COMPREHENSIVE METABOLIC PANEL
ALT: 18 U/L (ref 0–44)
AST: 29 U/L (ref 15–41)
Albumin: 4 g/dL (ref 3.5–5.0)
Alkaline Phosphatase: 100 U/L (ref 38–126)
Anion gap: 13 (ref 5–15)
BUN: 19 mg/dL (ref 8–23)
CO2: 25 mmol/L (ref 22–32)
Calcium: 9.5 mg/dL (ref 8.9–10.3)
Chloride: 101 mmol/L (ref 98–111)
Creatinine, Ser: 1.16 mg/dL — ABNORMAL HIGH (ref 0.44–1.00)
GFR, Estimated: 47 mL/min — ABNORMAL LOW (ref >60)
Glucose, Bld: 98 mg/dL (ref 70–99)
Potassium: 4.7 mmol/L (ref 3.5–5.1)
Sodium: 139 mmol/L (ref 135–145)
Total Bilirubin: 0.3 mg/dL (ref 0.3–1.2)
Total Protein: 7.2 g/dL (ref 6.5–8.1)

## 2022-06-07 LAB — CBC
HCT: 46.3 % — ABNORMAL HIGH (ref 36.0–46.0)
Hemoglobin: 15.1 g/dL — ABNORMAL HIGH (ref 12.0–15.0)
MCH: 31.6 pg (ref 26.0–34.0)
MCHC: 32.6 g/dL (ref 30.0–36.0)
MCV: 96.9 fL (ref 80.0–100.0)
Platelets: 303 10*3/uL (ref 150–400)
RBC: 4.78 MIL/uL (ref 3.87–5.11)
RDW: 13.2 % (ref 11.5–15.5)
WBC: 6.4 10*3/uL (ref 4.0–10.5)
nRBC: 0 % (ref 0.0–0.2)

## 2022-06-07 LAB — ETHANOL: Alcohol, Ethyl (B): <10 mg/dL (ref <10)

## 2022-06-07 LAB — RESP PANEL BY RT-PCR (FLU A&B, COVID) ARPGX2
Influenza A by PCR: NEGATIVE
Influenza B by PCR: NEGATIVE
SARS Coronavirus 2 by RT PCR: NEGATIVE

## 2022-06-07 LAB — CBG MONITORING, ED: Glucose-Capillary: 96 mg/dL (ref 70–99)

## 2022-06-07 LAB — PROTIME-INR
INR: 0.9 (ref 0.8–1.2)
Prothrombin Time: 12.1 seconds (ref 11.4–15.2)

## 2022-06-07 LAB — APTT: aPTT: 24 seconds (ref 24–36)

## 2022-06-07 MED ORDER — ASPIRIN 300 MG RE SUPP: 300.0000 mg | Freq: Every day | RECTAL | Status: DC

## 2022-06-07 MED ORDER — ACETAMINOPHEN 325 MG PO TABS: 650.0000 mg | ORAL_TABLET | Freq: Four times a day (QID) | ORAL | Status: DC | PRN

## 2022-06-07 MED ORDER — CLOPIDOGREL BISULFATE 75 MG PO TABS
75.0000 mg | ORAL_TABLET | Freq: Every day | ORAL | Status: DC
  Administered 2022-06-07 – 2022-06-09 (×3): 75 mg via ORAL
  Filled 2022-06-07 (×3): qty 1

## 2022-06-07 MED ORDER — ASPIRIN 81 MG PO CHEW
81.0000 mg | CHEWABLE_TABLET | Freq: Every day | ORAL | Status: DC
  Administered 2022-06-07 – 2022-06-09 (×3): 81 mg via ORAL
  Filled 2022-06-07 (×3): qty 1

## 2022-06-07 MED ORDER — SODIUM CHLORIDE 0.9 % IV SOLN: INTRAVENOUS | Status: DC

## 2022-06-07 MED ORDER — SODIUM CHLORIDE 0.9 % IV SOLN: INTRAVENOUS | Status: AC

## 2022-06-07 MED ORDER — ACETAMINOPHEN 650 MG RE SUPP: 650.0000 mg | Freq: Four times a day (QID) | RECTAL | Status: DC | PRN

## 2022-06-07 MED ORDER — STROKE: EARLY STAGES OF RECOVERY BOOK
Freq: Once | Status: AC
  Filled 2022-06-07: qty 1

## 2022-06-07 NOTE — Consult Note (Signed)
NEUROLOGY CONSULTATION NOTE   Date of service: June 07, 2022 Patient Name: Jean [name] MRN:  [id number] DOB:  Oct 21, 1940 Reason for consult: stroke code Requesting physician: Dr. 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  Staff last saw her sitting up and eating normally at noon although nobody spoke to her or examined her at that time. She was then found just prior to facility calling 911 facedown on her bed, obtunded, nonverbal, and poorly responsive to verbal or physical stimuli. On arrival she was lethargic, oriented but confused in conversation, hypophonic with BUE>BLE weakness and dysarthria. NIHSS = 13. CT head NAICP. TNK not administered 2/2 presentation outside of the window. CTA was delayed 2/2 difficulty obtaining IV access. While access was being attempted she gradually improved to NIHSS = 4 only for chronic blindness and mild LUE drift, therefore CTA was not performed. Patient does not have hx seizures. She is prescribed both lorazepam and librium for anxiety, unknown if she missed any doses. Unable to reach family by phone for collateral hx.  CNS imaging was personally reviewed   ROS   UTA 2/2 confusion  Past History   I have reviewed the following:  Past Medical History:  Diagnosis Date   Allergy    Anemia    Anxiety    Asthma    CAD (coronary artery disease)    Depression    Diverticulosis    Dysfunction of eustachian tube    Elevated LFTs    Esophageal reflux    Esophageal stricture 2002   Hemorrhoids    History of rectal polyps    HLD (hyperlipidemia)    HTN (hypertension)    Hypertonicity of bladder     Irritable bowel syndrome with constipation    Obesity    Optic neuritis    Osteoporosis    Peptic ulcer disease    Peripheral neuropathy    Trigeminal neuralgia    prior injury   Visual loss, bilateral    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, 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   Vitamin D deficiency    Past Surgical History:  Procedure Laterality Date   ABDOMINAL HYSTERECTOMY     ANAL RECTAL MANOMETRY N/A 07/05/2014   Procedure: ANO RECTAL MANOMETRY;  Surgeon: Leighton Ruff, MD;  Location: [facility];  Service: Endoscopy;  Laterality: N/A;   APPENDECTOMY     CAROTID ENDARTERECTOMY     COLONOSCOPY  2008, 2015   TONSILLECTOMY     UPPER GASTROINTESTINAL ENDOSCOPY  2002   Family History  Problem Relation Age of Onset   Alzheimer's disease Mother    Coronary artery disease Father    Heart attack Father    Heart disease Father    Anuerysm Father        AAA   Ovarian cancer Sister    Lung cancer Brother    Heart disease Brother    Anuerysm Brother        AAA   Diabetes Neg Hx    Colon cancer Neg Hx    Social History   Socioeconomic History   Marital status:  Divorced    Spouse name: Not on file   Number of children: 2   Years of education: Not on file   Highest education level: Not on file  Occupational History   Occupation: retired    Fish farm manager: RETIRED  Tobacco Use   Smoking status: Never   Smokeless tobacco: Never  Vaping Use   Vaping Use: Never used  Substance and Sexual Activity   Alcohol use: No    Alcohol/week: 0.0 standard drinks of alcohol   Drug use: No   Sexual activity: Never  Other Topics Concern   Not on file  Social History Narrative   HSG, business college   Married 59-43yr/divorced   2 son-'60; 2 granddaughters   Work: sNetwork engineer travel clerk   [place]   Patient never smoked. Did have second hand smoke exposure childhood and work   Alcohol none   Walks with walker   Social  Determinants of Health   Financial Resource Strain: Low Risk  (06/10/2018)   Overall Financial Resource Strain (CARDIA)    Difficulty of Paying Living Expenses: Not hard at all  Food Insecurity: No Food Insecurity (06/10/2018)   Hunger Vital Sign    Worried About Running Out of Food in the Last Year: Never true    Ran Out of Food in the Last Year: Never true  Transportation Needs: No Transportation Needs (06/10/2018)   PRAPARE - THydrologist(Medical): No    Lack of Transportation (Non-Medical): No  Physical Activity: Sufficiently Active (06/10/2018)   Exercise Vital Sign    Days of Exercise per Week: 5 days    Minutes of Exercise per Session: 30 min  Stress: No Stress Concern Present (06/10/2018)   [place]   Feeling of Stress : Not at all  Social Connections: Moderately Isolated (06/10/2018)   Social Connection and Isolation Panel [NHANES]    Frequency of Communication with Friends and Family: Twice a week    Frequency of Social Gatherings with Friends and Family: Twice a week    Attends Religious Services: Never    AMarine scientistor Organizations: No    Attends CMusic therapist Never    Marital Status: Divorced   Allergies  Allergen Reactions   Biaxin [Clarithromycin]    Clarithromycin    Doxycycline    Fosamax [Alendronate Sodium]    Geodon [Ziprasidone Hydrochloride]    Lipitor [Atorvastatin Calcium]    Penicillins    Pravachol [Pravastatin Sodium]    Statins    Sulfonamide Derivatives    Zithromax [Azithromycin]    Zocor [Simvastatin]     Medications   (Not in a hospital admission)     Current Facility-Administered Medications:    [START ON 06/08/2022]  stroke: early stages of recovery book, , Does not apply, Once, SDerek Jack MD   aspirin chewable tablet 81 mg, 81 mg, Oral, Daily, 81 mg at 06/07/22 1801 **OR** aspirin suppository 300 mg, 300  mg, Rectal, Daily, SDerek Jack MD   clopidogrel (PLAVIX) tablet 75 mg, 75 mg, Oral, Daily, SDerek Jack MD, 75 mg at 06/07/22 1801  Current Outpatient Medications:    alum & mag hydroxide-simeth (MAALOX/MYLANTA) 200-200-20 MG/5ML suspension, Take 30 mLs by mouth at bedtime. 9pm, Disp: , Rfl:    alum & mag hydroxide-simeth (MAALOX/MYLANTA) 2016-010-93MG/5ML suspension, Take 30 mLs by mouth every 4 (four) hours  as needed for indigestion or heartburn., Disp: , Rfl:    Acetaminophen (TYLENOL) 325 MG CAPS, Take 650 mg by mouth in the morning and at bedtime. AS NEEDED FOR PAIN *NOT TO EXCEED 3,000 MG IN 24HRS*, Disp: , Rfl:    acetaminophen (TYLENOL) 325 MG tablet, Take 650 mg by mouth every 8 (eight) hours as needed. 8am & 8pm AS NEEDED FOR PAIN *NOT TO EXCEED 3,000 MG IN 24HRS*, Disp: , Rfl:    amitriptyline (ELAVIL) 50 MG tablet, Take 50 mg by mouth at bedtime. 9pm, Disp: , Rfl:    ARTIFICIAL TEAR OP, Apply 1 drop to eye 3 (three) times daily. Both Eyes at 8am, 2pm, and 9pm., Disp: , Rfl:    aspirin 81 MG EC tablet, Take 81 mg by mouth daily. 9am, Disp: , Rfl:    Benzocaine (ORAJEL MT), Use as directed in the mouth or throat. 20-0.26-0.15 %; amt: SMALL AMOUNT Three Times A Day - PRN;  mucous membrane Special Instructions: Apply to right inner mouth sore TID PRN [DX: Other specified disorders of teeth and supporting structures]  Every 4 Hours - PRN;may self-administer, Disp: , Rfl:    bisacodyl (DULCOLAX) 10 MG suppository, Place 10 mg rectally daily as needed for moderate constipation., Disp: , Rfl:    bisacodyl (DULCOLAX) 5 MG EC tablet, Take 5 mg by mouth daily as needed for moderate constipation., Disp: , Rfl:    carbamide peroxide (DEBROX) 6.5 % OTIC solution, 5 drops as needed., Disp: , Rfl:    chlordiazePOXIDE (LIBRIUM) 25 MG capsule, Take one capsule by mouth at bedtime, Disp: 30 capsule, Rfl: 0   diclofenac Sodium (VOLTAREN) 1 % GEL, Apply 2 g topically 2 (two) times daily as needed.  apply to lower back area, Disp: , Rfl:    famotidine (PEPCID) 20 MG tablet, Take 20 mg by mouth at bedtime. 8pm, Disp: , Rfl:    fluPHENAZine (PROLIXIN) 1 MG tablet, Take 1 mg by mouth at bedtime. 8pm., Disp: , Rfl:    furosemide (LASIX) 20 MG tablet, Take 20 mg by mouth daily. 8am, Disp: , Rfl:    guaiFENesin (MUCINEX) 600 MG 12 hr tablet, Take 600 mg by mouth daily as needed (Cold or cogestion)., Disp: , Rfl:    HYDROcodone-acetaminophen (NORCO) 5-325 MG tablet, Take 1 tablet by mouth daily., Disp: 30 tablet, Rfl: 0   hydrocortisone valerate cream (WESTCORT) 0.2 %, Apply 1 application  topically 2 (two) times daily. Twice A Day; MORNING 07:00 AM - 10:00 AM, EVENING 07:00 PM - 10:00 PM, Disp: , Rfl:    ketoconazole (NIZORAL) 2 % cream, Apply 1 application  topically 2 (two) times daily as needed. Twice A Day; MORNING 07:00 AM - 10:00 AM, EVENING 07:00 PM - 10:00 PM, Disp: , Rfl:    ketoconazole (NIZORAL) 2 % shampoo, Apply 1 application. topically 2 (two) times a week. Tuesday and Friday, Disp: , Rfl:    loratadine (CLARITIN) 10 MG tablet, Take 10 mg by mouth daily., Disp: , Rfl:    LORazepam (ATIVAN) 0.5 MG tablet, Take 1 tablet (0.5 mg total) by mouth 2 (two) times daily., Disp: 60 tablet, Rfl: 0   magnesium hydroxide (MILK OF MAGNESIA) 400 MG/5ML suspension, Take 30 mLs by mouth daily as needed. , Disp: , Rfl:    memantine (NAMENDA) 10 MG tablet, Take 10 mg by mouth 2 (two) times daily., Disp: , Rfl:    Multiple Vitamins-Minerals (SENTRY SENIOR) TABS, Take 1 tablet by mouth daily. 500-300-250 mcg [DX:  Vitamin deficiency, unspecified], Disp: , Rfl:    OYSTER SHELL PO, Take 250 mg by mouth in the morning and at bedtime., Disp: , Rfl:    Polyethylene Glycol 3350 (MIRALAX PO), Take by mouth in the morning. Take  2 capful; Special Instructions: MIX WITH 8oz OF FLUIDS. ** RES. REQUESTED AM DOSAGE GIVEN AT 6:30AM**, Disp: , Rfl:    saccharomyces boulardii (FLORASTOR) 250 MG capsule, Take 250 mg by  mouth. 8am, Disp: , Rfl:    spironolactone (ALDACTONE) 25 MG tablet, Take 12.5 mg by mouth daily., Disp: , Rfl:    triamcinolone cream (KENALOG) 0.1 %, Apply 1 application. topically as needed., Disp: , Rfl:   Vitals   Vitals:   06/07/22 1736 06/07/22 1754 06/07/22 1800 06/07/22 1815  BP:  (!) 129/91 (!) 163/103 (!) 178/82  Pulse:  70 70 77  Resp:  (!) 24 19 (!) 21  Temp: 98.6 F (37 C)     TempSrc: Oral     SpO2:  99% 96% 100%  Weight:         Body mass index is 25.24 kg/m.  Physical Exam   Physical Exam Gen: lethargic, oriented x3 but confused in conversation, tearful HEENT: Atraumatic, normocephalic;mucous membranes moist; oropharynx clear, tongue without atrophy or fasciculations. Neck: Supple, trachea midline. Resp: CTAB, no w/r/r CV: RRR, no m/g/r; nml S1 and S2. 2+ symmetric peripheral pulses. Abd: soft/NT/ND; nabs x 4 quad Extrem: Nml bulk; no cyanosis, clubbing, or edema.  Neuro: *MS: lethargic, oriented x3 but confused in conversation, tearful, able to follow simple commands *Speech: mild dyasrthria, mildly impaired naming which subsequently improved *CN:    I: Deferred   II,III: PERRLA, blind in both eyes (chronic), optic discs unable to be visualized 2/2 pupillary constriction   III,IV,VI: EOMI w/o nystagmus, no ptosis   V: Sensation intact from V1 to V3 to LT   VII: Eyelid closure was full.  Face symmetric at rest.   VIII: Hearing intact to voice   IX,X: Voice normal, palate elevates symmetrically    XI: SCM/trap 5/5 bilat   XII: Tongue protrudes midline, no atrophy or fasciculations   *Motor:   Normal bulk.  No tremor, rigidity or bradykinesia. Drift but not to bed BUE, drift to bed RLE, some movement against gravity BLE. *Sensory: Intact to light touch, pinprick, temperature vibration throughout. Symmetric. Propioception intact bilat.  Unable to assess DSS 2/2 confusion.  *Coordination:  UTA 2/2 confusion *Reflexes:  2+ and symmetric throughout without  clonus; toes down-going bilat *Gait: deferred  NIHSS  1a Level of Conscious.: 1 1b LOC Questions: 0 1c LOC Commands: 0 2 Best Gaze: 0 3 Visual: 3 4 Facial Palsy: 0 5a Motor Arm - left: 1 5b Motor Arm - Right: 1 6a Motor Leg - Left: 3 6b Motor Leg - Right: 2 7 Limb Ataxia: 0 8 Sensory: 0 9 Best Language: 1 10 Dysarthria: 1 11 Extinct. and Inatten.: 0  TOTAL: 13   Premorbid mRS = 2   Labs   CBC:  Recent Labs  Lab 06/07/22 1641 06/07/22 1646  WBC 6.4  --   NEUTROABS 3.1  --   HGB 15.1* 16.3*  HCT 46.3* 48.0*  MCV 96.9  --   PLT 303  --     Basic Metabolic Panel:  Lab Results  Component Value Date   NA 138 06/07/2022   K 5.0 06/07/2022   CO2 25 06/07/2022   GLUCOSE 97 06/07/2022   BUN 27 (H) 06/07/2022  CREATININE 1.00 06/07/2022   CALCIUM 9.5 06/07/2022   GFRNONAA 47 (L) 06/07/2022   GFRAA 62 03/02/2020   Lipid Panel:  Lab Results  Component Value Date   LDLCALC 147 11/21/2018   HgbA1c:  Lab Results  Component Value Date   HGBA1C 5.9 02/05/2017   Urine Drug Screen:     Component Value Date/Time   LABOPIA NONE DETECTED 06/07/2022 1641   COCAINSCRNUR NONE DETECTED 06/07/2022 1641   LABBENZ POSITIVE (A) 06/07/2022 1641   AMPHETMU NONE DETECTED 06/07/2022 1641   THCU NONE DETECTED 06/07/2022 1641   LABBARB NONE DETECTED 06/07/2022 1641    Alcohol Level     Component Value Date/Time   ETH <10 06/07/2022 1645     Impression   This is an 81 year old woman with past medical history significant for CAD, hyperlipidemia, hypertension, 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  On arrival she was lethargic, oriented but confused in conversation, hypophonic with BUE>BLE weakness and dysarthria. NIHSS = 13. CT head NAICP. TNK not administered 2/2 presentation outside of the window. CTA was  delayed 2/2 difficulty obtaining IV access. While access was being attempted she gradually improved to NIHSS = 4 only for chronic blindness and mild LUE drift, therefore CTA was not performed. Patient does not have hx seizures. She is prescribed both lorazepam and librium for anxiety, unknown if she missed any doses. Unable to reach family by phone for collateral hx. Given her rapid improvement without intervention, seizures are at the top of my differential. She does not have a personal hx of seizures therefore will perform stroke workup as well since that is the #1 cause of new onset seizures in this age group. Will hold off on AEDs for now although if her EEG shows epileptiform abnl or MRI brain shows relevant abnl I would recommend intiiation of AEDs at that point.  Recommendations   - Admit to hospitalist service - Permissive HTN x48 hrs from sx onset or until stroke ruled out by MRI goal BP <220/110. PRN labetalol or hydralazine if BP above these parameters. Avoid oral antihypertensives. - MRI brain wo contrast - MRA H&N - TTE - Check A1c and LDL + add statin per guidelines - ASA '81mg'$  daily + plavix '75mg'$  daily - rEEG - Please clarify dose of benzos (lorazepam + librium, any others) that patient is on at facility and continue that dose. Concern for seizure therefore caution should be taken to avoid withdrawal - Will hold off on AEDs for now although if her EEG shows epileptiform abnl or MRI brain shows relevant abnl I would recommend intiiation of AEDs at that point. - q4 hr neuro checks - STAT head CT for any change in neuro exam - Tele - PT/OT/SLP - Stroke education - Amb referral to neurology upon discharge    Will continue to follow ______________________________________________________________________   Thank you for the opportunity to take part in the care of this patient. If you have any further questions, please contact the neurology consultation attending.  Signed,  Su Monks, MD [facility] [phone]  If 7pm- 7am, please page neurology on call as listed in [place].

## 2022-06-07 NOTE — Code Documentation (Signed)
Pt Jean [name] is a 81 yr old female arriving to [facility] via EMS on 06/07/2022. She has a PMH of HTN GERD anxiety and blindness. She is on no thinners.     Pt is from Facility where she was LKW eating lunch at 1230. Sometime after 1600, she was found curled up in a ball not speaking or following commands.     Stroke team at bedside on Pt. Arrival. Labs unable to be drawn due to poor access. CBG done, airway cleared by EDP. Pt to CT with team.NIHSS 13 (See documentation for times and details). Pt lethargic, slow to answer questions, and barely able to whisper. Seemed to move al extremities, albeit weakly. The following imaging was completed: CTH. CTH negative for acute hemorrhage per Dr Quinn Axe. Multiple attempts made to obtain access for CTA which failed. By 0479, pt much more alert and verbal than before, and weakness has resolved.  CTA no longer needed.  Pt returned to [facility] where her workup will continue. She will need q 2 hr VS and Neuro checks. Bedside handoff with [place].

## 2022-06-07 NOTE — H&P (Signed)
History and Physical    Patient: Jean [name]:[id number] DOB: [birth year] DOA: 06/07/2022 DOS: the patient was seen and examined on 06/07/2022 PCP: [name], MD  Patient coming from: SNF  Chief Complaint:  Chief Complaint  Patient presents with   Code Stroke   HPI: Jean [name] is a 81 y.o. female with medical history significant of hypertension, GERD who presents to the emergency department via EMS from a nursing facility due to change in patient's mental status.  Patient was usually able to ambulate and carry a conversation at baseline, but she was last well-known at 12 PM when having lunch, after which she was noted to be sleeping around 2 PM, around 3:30 PM, patient was found facedown on the bed and was poorly responsive to verbal stimuli as well as physical stimuli (patient states that she heard all that was being said to her, but she had difficulty in being able to respond).  EMS was activated and patient was sent to the ED for further evaluation and management.  There was no report of fever, chills, chest pain, shortness of breath, nausea, vomiting.  ED Course:  In the emergency department, BP was 170/98, but other vital signs were within normal range.  Work-up in the ED showed normal CBC except for elevated H/H at 15.1/46.3, BMP was normal except for creatinine of 1.16, alcohol level was less than 10, urinalysis was normal, urine drug screen was positive for benzodiazepine (patient takes Ativan at home).  Influenza A, B, SARS coronavirus 2 was negative. CT head without contrast showed no evidence of acute intracranial abnormality Neurologist was consulted and recommended further stroke work-up.  Hospitalist was asked to admit patient for further evaluation and management.   Review of Systems: Review of systems as noted in the HPI. All other systems reviewed and are negative.   Past Medical History:  Diagnosis Date   Allergy    Anemia    Anxiety    Asthma    CAD  (coronary artery disease)    Depression    Diverticulosis    Dysfunction of eustachian tube    Elevated LFTs    Esophageal reflux    Esophageal stricture 2002   Hemorrhoids    History of rectal polyps    HLD (hyperlipidemia)    HTN (hypertension)    Hypertonicity of bladder    Irritable bowel syndrome with constipation    Obesity    Optic neuritis    Osteoporosis    Peptic ulcer disease    Peripheral neuropathy    Trigeminal neuralgia    prior injury   Visual loss, bilateral    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, 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   Vitamin D deficiency    Past Surgical History:  Procedure Laterality Date   ABDOMINAL HYSTERECTOMY     ANAL RECTAL MANOMETRY N/A 07/05/2014   Procedure: ANO RECTAL MANOMETRY;  Surgeon: Leighton Ruff, MD;  Location: [facility];  Service: Endoscopy;  Laterality: N/A;   APPENDECTOMY     CAROTID ENDARTERECTOMY     COLONOSCOPY  2008, 2015   TONSILLECTOMY     UPPER GASTROINTESTINAL ENDOSCOPY  2002    Social History:  reports that she has never smoked. She has never used smokeless tobacco. She reports that she does not drink alcohol and does not use drugs.   Allergies  Allergen Reactions   Biaxin [Clarithromycin]    Clarithromycin    Doxycycline    Fosamax [Alendronate Sodium]  Geodon [Ziprasidone Hydrochloride]    Lipitor [Atorvastatin Calcium]    Penicillins    Pravachol [Pravastatin Sodium]    Statins    Sulfonamide Derivatives    Zithromax [Azithromycin]    Zocor [Simvastatin]     Family History  Problem Relation Age of Onset   Alzheimer's disease Mother    Coronary artery disease Father    Heart attack Father    Heart disease Father    Anuerysm Father        AAA   Ovarian cancer Sister    Lung cancer Brother    Heart disease Brother    Anuerysm Brother        AAA   Diabetes Neg Hx    Colon cancer Neg Hx      Prior to Admission medications   Medication Sig Start Date End Date Taking? Authorizing  Provider  alum & mag hydroxide-simeth (MAALOX/MYLANTA) 200-200-20 MG/5ML suspension Take 30 mLs by mouth at bedtime. 9pm   Yes [provider]  alum & mag hydroxide-simeth (MAALOX/MYLANTA) 200-200-20 MG/5ML suspension Take 30 mLs by mouth every 4 (four) hours as needed for indigestion or heartburn.   Yes [provider]  Acetaminophen (TYLENOL) 325 MG CAPS Take 650 mg by mouth in the morning and at bedtime. AS NEEDED FOR PAIN *NOT TO EXCEED 3,000 MG IN 24HRS*    [provider]  acetaminophen (TYLENOL) 325 MG tablet Take 650 mg by mouth every 8 (eight) hours as needed. 8am & 8pm AS NEEDED FOR PAIN *NOT TO EXCEED 3,000 MG IN 24HRS*    [provider]  amitriptyline (ELAVIL) 50 MG tablet Take 50 mg by mouth at bedtime. 9pm    [provider]  ARTIFICIAL TEAR OP Apply 1 drop to eye 3 (three) times daily. Both Eyes at 8am, 2pm, and 9pm.    [provider]  aspirin 81 MG EC tablet Take 81 mg by mouth daily. 9am    [provider]  Benzocaine (ORAJEL MT) Use as directed in the mouth or throat. 20-0.26-0.15 %; amt: SMALL AMOUNT Three Times A Day - PRN;  mucous membrane Special Instructions: Apply to right inner mouth sore TID PRN [DX: Other specified disorders of teeth and supporting structures]  Every 4 Hours - PRN;may self-administer    [provider]  bisacodyl (DULCOLAX) 10 MG suppository Place 10 mg rectally daily as needed for moderate constipation.    [provider]  bisacodyl (DULCOLAX) 5 MG EC tablet Take 5 mg by mouth daily as needed for moderate constipation.    [provider]  carbamide peroxide (DEBROX) 6.5 % OTIC solution 5 drops as needed.    [provider]  chlordiazePOXIDE (LIBRIUM) 25 MG capsule Take one capsule by mouth at bedtime 04/11/22   Fargo, Amy E, NP  diclofenac Sodium (VOLTAREN) 1 % GEL Apply 2 g topically 2 (two) times daily as needed. apply to lower back area    [provider]  famotidine (PEPCID) 20 MG tablet Take 20 mg by mouth at bedtime. 8pm    [provider]  fluPHENAZine (PROLIXIN) 1 MG tablet Take 1 mg by mouth at bedtime. 8pm.    [provider]  furosemide (LASIX) 20 MG tablet Take 20 mg by mouth daily. 8am    [provider]  guaiFENesin (MUCINEX) 600 MG 12 hr tablet Take 600 mg by mouth daily as needed (Cold or cogestion).    [provider]  HYDROcodone-acetaminophen (NORCO) 5-325 MG tablet  Take 1 tablet by mouth daily. 03/28/22   Medina-Vargas, Monina C, NP  hydrocortisone valerate cream (WESTCORT) 0.2 % Apply 1 application  topically 2 (two) times daily. Twice A Day; MORNING 07:00 AM - 10:00 AM, EVENING 07:00 PM - 10:00 PM    [provider]  ketoconazole (NIZORAL) 2 % cream Apply 1 application  topically 2 (two) times daily as needed. Twice A Day; MORNING 07:00 AM - 10:00 AM, EVENING 07:00 PM - 10:00 PM    [provider]  ketoconazole (NIZORAL) 2 % shampoo Apply 1 application. topically 2 (two) times a week. Tuesday and Friday    [provider]  loratadine (CLARITIN) 10 MG tablet Take 10 mg by mouth daily.    [provider]  LORazepam (ATIVAN) 0.5 MG tablet Take 1 tablet (0.5 mg total) by mouth 2 (two) times daily. 04/09/22   Fargo, Amy E, NP  magnesium hydroxide (MILK OF MAGNESIA) 400 MG/5ML suspension Take 30 mLs by mouth daily as needed.     [provider]  memantine (NAMENDA) 10 MG tablet Take 10 mg by mouth 2 (two) times daily.    [provider]  Multiple Vitamins-Minerals (SENTRY SENIOR) TABS Take 1 tablet by mouth daily. 500-300-250 mcg [DX: Vitamin deficiency, unspecified]    [provider]  OYSTER SHELL PO Take 250 mg by mouth in the morning and at bedtime.    [provider]  Polyethylene Glycol 3350 (MIRALAX PO) Take by mouth in the morning. Take  2 capful; Special Instructions: MIX WITH 8oz OF FLUIDS. ** RES. REQUESTED AM  DOSAGE GIVEN AT 6:30AM**    [provider]  saccharomyces boulardii (FLORASTOR) 250 MG capsule Take 250 mg by mouth. 8am    [provider]  spironolactone (ALDACTONE) 25 MG tablet Take 12.5 mg by mouth daily.    [provider]  triamcinolone cream (KENALOG) 0.1 % Apply 1 application. topically as needed.    [provider]    Physical Exam: BP (!) 178/82   Pulse 77   Temp 98.2 F (36.8 C) (Oral)   Resp (!) 21   Wt 68.8 kg   SpO2 100%   BMI 25.24 kg/m   General: 81 y.o. year-old female ill appearing, but in no acute distress.  Alert and oriented x3. HEENT: NCAT, EOMI, dry mucous membranes Neck: Supple, trachea medial Cardiovascular: Regular rate and rhythm with no rubs or gallops.  No thyromegaly or JVD noted.  No lower extremity edema. 2/4 pulses in all 4 extremities. Respiratory: Clear to auscultation with no wheezes or rales. Good inspiratory effort. Abdomen: Soft, nontender nondistended with normal bowel sounds x4 quadrants. Muskuloskeletal: No cyanosis, clubbing or edema noted bilaterally Neuro: CN II-XII intact, strength 5/5 x 4, sensation, reflexes intact Skin: No ulcerative lesions noted or rashes Psychiatry: Judgement and insight appear normal. Mood is appropriate for condition and setting          Labs on Admission:  Basic Metabolic Panel: Recent Labs  Lab 06/07/22 1641 06/07/22 1646  NA 139 138  K 4.7 5.0  CL 101 103  CO2 25  --   GLUCOSE 98 97  BUN 19 27*  CREATININE 1.16* 1.00  CALCIUM 9.5  --    Liver Function Tests: Recent Labs  Lab 06/07/22 1641  AST 29  ALT 18  ALKPHOS 100  BILITOT 0.3  PROT 7.2  ALBUMIN 4.0   No results for input(s): "LIPASE", "AMYLASE" in the last 168 hours. No results for input(s): "  AMMONIA" in the last 168 hours. CBC: Recent Labs  Lab 06/07/22 1641 06/07/22 1646  WBC 6.4  --   NEUTROABS 3.1  --   HGB 15.1* 16.3*  HCT 46.3* 48.0*  MCV 96.9  --   PLT 303  --    Cardiac  Enzymes: No results for input(s): "CKTOTAL", "CKMB", "CKMBINDEX", "TROPONINI" in the last 168 hours.  BNP (last 3 results) No results for input(s): "BNP" in the last 8760 hours.  ProBNP (last 3 results) No results for input(s): "PROBNP" in the last 8760 hours.  CBG: Recent Labs  Lab 06/07/22 1637  GLUCAP 96    Radiological Exams on Admission: CT HEAD CODE STROKE WO CONTRAST  Result Date: 06/07/2022 CLINICAL DATA:  Code stroke.  Neuro deficit, acute, stroke suspected EXAM: CT HEAD WITHOUT CONTRAST TECHNIQUE: Contiguous axial images were obtained from the base of the skull through the vertex without intravenous contrast. RADIATION DOSE REDUCTION: This exam was performed according to the departmental dose-optimization program which includes automated exposure control, adjustment of the mA and/or kV according to patient size and/or use of iterative reconstruction technique. COMPARISON:  CT head March 10, 2021. FINDINGS: Brain: No evidence of acute large vascular territory infarction, hemorrhage, hydrocephalus, extra-axial collection or mass lesion/mass effect. Mild for age patchy white matter hypoattenuation, nonspecific but compatible with chronic microvascular ischemic disease. Vascular: No hyperdense vessel identified. Calcific intracranial atherosclerosis. Skull: No acute fracture. Sinuses/Orbits: No acute findings. ASPECTS [facility] Stroke Program Early CT Score) total score (0-10 with 10 being normal): 10. IMPRESSION: 1. No evidence of acute intracranial abnormality. 2. ASPECTS is 10. Code stroke imaging results were communicated on 06/07/2022 at 4:52 pm to provider Dr. Quinn Axe Via secure text paging. Electronically Signed   By: Margaretha Sheffield M.D.   On: 06/07/2022 16:52    EKG: I independently viewed the EKG done and my findings are as followed: Normal sinus rhythm at a rate of 74 bpm with VPCs  Assessment/Plan Present on Admission:  GERD (gastroesophageal reflux disease)  Essential  hypertension  Anxiety state  Principal Problem:   Seizures ([place]) Active Problems:   Anxiety state   Essential hypertension   GERD (gastroesophageal reflux disease)   Dehydration  Possible seizures, rule out acute ischemic stroke Patient will be admitted to telemetry unit  CT head without contrast showed no evidence of acute intracranial abnormality MRA of head and neck will be done Echocardiogram in the morning MRI of brain without contrast in the morning Routine EEG will be done Continue aspirin and Plavix Continue fall precautions and neuro checks Lipid panel and hemoglobin A1c will be checked Continue PT/SLP/OT eval and treat Bedside swallow eval by nursing prior to diet Neurology was already consulted and following.  Recommendations appreciated  Dehydration Continue IV hydration  Essential hypertension Permissive hypertension for 48 hours from onset until stroke ruled out by MRI goal <220/110. Avoid antihypertensives unless BP is above mentioned parameters with as needed labetalol or hydralazine.  GERD Continue famotidine  Anxiety Continue lorazepam  DVT prophylaxis: SCDs   Advance Care Planning:   Code Status: DNR   Consults: Neurology  Family Communication: None at bedside  Severity of Illness: The appropriate patient status for this patient is OBSERVATION. Observation status is judged to be reasonable and necessary in order to provide the required intensity of service to ensure the patient's safety. The patient's presenting symptoms, physical exam findings, and initial radiographic and laboratory data in the context of their medical condition is felt to place them at  decreased risk for further clinical deterioration. Furthermore, it is anticipated that the patient will be medically stable for discharge from the hospital within 2 midnights of admission.   Author: Bernadette Hoit, DO 06/07/2022 11:33 PM  For on call review [email or web address].

## 2022-06-07 NOTE — ED Triage Notes (Addendum)
Pt BIB EMS due to code stroke. Pt is usually independent axox4 and staff found her face down but responsive. Nonverbal. VSS. LSN 1400. [facility] from [facility].

## 2022-06-07 NOTE — Plan of Care (Signed)
TSH elevated at 9.1. I have ordered repeat TSH to confirm and added FT4.  Su Monks, MD [facility] [phone]  If 7pm- 7am, please page neurology on call as listed in [place].

## 2022-06-07 NOTE — ED Notes (Signed)
Patient transported to MRI 

## 2022-06-07 NOTE — ED Provider Notes (Signed)
[facility] EMERGENCY DEPARTMENT Provider Note   CSN: [id number] Arrival date & time: 06/07/22  1634  An emergency department physician performed an initial assessment on this suspected stroke patient at 1636.  History  Chief Complaint  Patient presents with   Code Stroke    Jean [name] is a 81 y.o. female. Presenting from facility due to altered mental status.  At baseline patient is independent and able to ambulate and carry a conversation.  She is checked on frequently by nursing staff.  She was last seen acting normally at approximately 12 PM.  She was eating lunch.  She was then seen napping at approximately 1400.  At 1530 patient was found facedown in her room and was not communicating or following commands.  EMS was called.  HPI     Home Medications Prior to Admission medications   Medication Sig Start Date End Date Taking? Authorizing Provider  alum & mag hydroxide-simeth (MAALOX/MYLANTA) 200-200-20 MG/5ML suspension Take 30 mLs by mouth at bedtime. 9pm   Yes [provider]  alum & mag hydroxide-simeth (MAALOX/MYLANTA) 200-200-20 MG/5ML suspension Take 30 mLs by mouth every 4 (four) hours as needed for indigestion or heartburn.   Yes [provider]  Acetaminophen (TYLENOL) 325 MG CAPS Take 650 mg by mouth in the morning and at bedtime. AS NEEDED FOR PAIN *NOT TO EXCEED 3,000 MG IN 24HRS*    [provider]  acetaminophen (TYLENOL) 325 MG tablet Take 650 mg by mouth every 8 (eight) hours as needed. 8am & 8pm AS NEEDED FOR PAIN *NOT TO EXCEED 3,000 MG IN 24HRS*    [provider]  amitriptyline (ELAVIL) 50 MG tablet Take 50 mg by mouth at bedtime. 9pm    [provider]  ARTIFICIAL TEAR OP Apply 1 drop to eye 3 (three) times daily. Both Eyes at 8am, 2pm, and 9pm.    [provider]  aspirin 81 MG EC tablet Take 81 mg by mouth daily. 9am    [provider]  Benzocaine (ORAJEL MT) Use as  directed in the mouth or throat. 20-0.26-0.15 %; amt: SMALL AMOUNT Three Times A Day - PRN;  mucous membrane Special Instructions: Apply to right inner mouth sore TID PRN [DX: Other specified disorders of teeth and supporting structures]  Every 4 Hours - PRN;may self-administer    [provider]  bisacodyl (DULCOLAX) 10 MG suppository Place 10 mg rectally daily as needed for moderate constipation.    [provider]  bisacodyl (DULCOLAX) 5 MG EC tablet Take 5 mg by mouth daily as needed for moderate constipation.    [provider]  carbamide peroxide (DEBROX) 6.5 % OTIC solution 5 drops as needed.    [provider]  chlordiazePOXIDE (LIBRIUM) 25 MG capsule Take one capsule by mouth at bedtime 04/11/22   Fargo, Amy E, NP  diclofenac Sodium (VOLTAREN) 1 % GEL Apply 2 g topically 2 (two) times daily as needed. apply to lower back area    [provider]  famotidine (PEPCID) 20 MG tablet Take 20 mg by mouth at bedtime. 8pm    [provider]  fluPHENAZine (PROLIXIN) 1 MG tablet Take 1 mg by mouth at bedtime. 8pm.    [provider]  furosemide (LASIX) 20 MG tablet Take 20 mg by mouth daily. 8am    [provider]  guaiFENesin (MUCINEX) 600 MG 12 hr tablet Take 600 mg by mouth daily as needed (Cold or cogestion).  [provider]  HYDROcodone-acetaminophen (NORCO) 5-325 MG tablet Take 1 tablet by mouth daily. 03/28/22   Medina-Vargas, Monina C, NP  hydrocortisone valerate cream (WESTCORT) 0.2 % Apply 1 application  topically 2 (two) times daily. Twice A Day; MORNING 07:00 AM - 10:00 AM, EVENING 07:00 PM - 10:00 PM    [provider]  ketoconazole (NIZORAL) 2 % cream Apply 1 application  topically 2 (two) times daily as needed. Twice A Day; MORNING 07:00 AM - 10:00 AM, EVENING 07:00 PM - 10:00 PM    [provider]  ketoconazole (NIZORAL) 2 % shampoo Apply 1 application. topically 2 (two) times a week.  Tuesday and Friday    [provider]  loratadine (CLARITIN) 10 MG tablet Take 10 mg by mouth daily.    [provider]  LORazepam (ATIVAN) 0.5 MG tablet Take 1 tablet (0.5 mg total) by mouth 2 (two) times daily. 04/09/22   Fargo, Amy E, NP  magnesium hydroxide (MILK OF MAGNESIA) 400 MG/5ML suspension Take 30 mLs by mouth daily as needed.     [provider]  memantine (NAMENDA) 10 MG tablet Take 10 mg by mouth 2 (two) times daily.    [provider]  Multiple Vitamins-Minerals (SENTRY SENIOR) TABS Take 1 tablet by mouth daily. 500-300-250 mcg [DX: Vitamin deficiency, unspecified]    [provider]  OYSTER SHELL PO Take 250 mg by mouth in the morning and at bedtime.    [provider]  Polyethylene Glycol 3350 (MIRALAX PO) Take by mouth in the morning. Take  2 capful; Special Instructions: MIX WITH 8oz OF FLUIDS. ** RES. REQUESTED AM DOSAGE GIVEN AT 6:30AM**    [provider]  saccharomyces boulardii (FLORASTOR) 250 MG capsule Take 250 mg by mouth. 8am    [provider]  spironolactone (ALDACTONE) 25 MG tablet Take 12.5 mg by mouth daily.    [provider]  triamcinolone cream (KENALOG) 0.1 % Apply 1 application. topically as needed.    [provider]      Allergies    Biaxin [clarithromycin], Clarithromycin, Doxycycline, Fosamax [alendronate sodium], Geodon [ziprasidone hydrochloride], Lipitor [atorvastatin calcium], Penicillins, Pravachol [pravastatin sodium], Statins, Sulfonamide derivatives, Zithromax [azithromycin], and Zocor [simvastatin]    Review of Systems   Review of Systems  Unable to perform ROS: Mental status change    Physical Exam Updated Vital Signs BP (!) 178/82   Pulse 77   Temp 98.6 F (37 C) (Oral)   Resp (!) 21   Wt 68.8 kg   SpO2 100%   BMI 25.24 kg/m  Physical Exam Vitals and nursing note reviewed.  Constitutional:      General: She is not in acute distress.     Appearance: She is well-developed. She is ill-appearing.  HENT:     Head: Normocephalic and atraumatic.  Eyes:     Conjunctiva/sclera: Conjunctivae normal.  Cardiovascular:     Rate and Rhythm: Normal rate and regular rhythm.     Heart sounds: No murmur heard. Pulmonary:     Effort: Pulmonary effort is normal. No respiratory distress.     Breath sounds: Normal breath sounds.  Abdominal:     Palpations: Abdomen is soft.     Tenderness: There is no abdominal tenderness.  Musculoskeletal:        General: No swelling.     Cervical back: Neck supple.  Skin:    General: Skin is warm and dry.     Capillary Refill: Capillary refill takes less  than 2 seconds.  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     ED Results / Procedures / Treatments   Labs (all labs ordered are listed, but only abnormal results are displayed) Labs Reviewed  CBC - Abnormal; Notable for the following components:      Result Value   Hemoglobin 15.1 (*)    HCT 46.3 (*)    All other components within normal limits  COMPREHENSIVE METABOLIC PANEL - Abnormal; Notable for the following components:   Creatinine, Ser 1.16 (*)    GFR, Estimated 47 (*)    All other components within normal limits  RAPID URINE DRUG SCREEN, HOSP PERFORMED - Abnormal; Notable for the following components:   Benzodiazepines POSITIVE (*)    All other components within normal limits  URINALYSIS, ROUTINE W REFLEX MICROSCOPIC - Abnormal; Notable for the following components:   Color, Urine STRAW (*)    All other components within normal limits  I-STAT CHEM 8, ED - Abnormal; Notable for the following components:   BUN 27 (*)    Calcium, Ion 1.08 (*)    Hemoglobin 16.3 (*)    HCT  48.0 (*)    All other components within normal limits  RESP PANEL BY RT-PCR (FLU A&B, COVID) ARPGX2  ETHANOL  PROTIME-INR  APTT  DIFFERENTIAL  LIPID PANEL  HEMOGLOBIN A1C  CBG MONITORING, ED    EKG None  Radiology CT HEAD CODE STROKE WO CONTRAST  Result Date: 06/07/2022 CLINICAL DATA:  Code stroke.  Neuro deficit, acute, stroke suspected EXAM: CT HEAD WITHOUT CONTRAST TECHNIQUE: Contiguous axial images were obtained from the base of the skull through the vertex without intravenous contrast. RADIATION DOSE REDUCTION: This exam was performed according to the departmental dose-optimization program which includes automated exposure control, adjustment of the mA and/or kV according to patient size and/or use of iterative reconstruction technique. COMPARISON:  CT head March 10, 2021. FINDINGS: Brain: No evidence of acute large vascular territory infarction, hemorrhage, hydrocephalus, extra-axial collection or mass lesion/mass effect. Mild for age patchy white matter hypoattenuation, nonspecific but compatible with chronic microvascular ischemic disease. Vascular: No hyperdense vessel identified. Calcific intracranial atherosclerosis. Skull: No acute fracture. Sinuses/Orbits: No acute findings. ASPECTS [facility] Stroke Program Early CT Score) total score (0-10 with 10 being normal): 10. IMPRESSION: 1. No evidence of acute intracranial abnormality. 2. ASPECTS is 10. Code stroke imaging results were communicated on 06/07/2022 at 4:52 pm to provider Dr. Quinn Axe Via secure text paging. Electronically Signed   By: Margaretha Sheffield M.D.   On: 06/07/2022 16:52    Procedures Procedures    Medications Ordered in ED Medications   stroke: early stages of recovery book (has no administration in time range)  aspirin chewable tablet 81 mg (81 mg Oral Given 06/07/22 1801)    Or  aspirin suppository 300 mg ( Rectal See Alternative 06/07/22 1801)  clopidogrel (PLAVIX) tablet 75 mg (75 mg Oral Given 06/07/22 1801)     ED Course/ Medical Decision Making/ A&P Clinical Course as of 06/07/22 2136  Thu Jun 07, 2022  1738 Neuro recommends hospitalist admission for stroke work-up including MRI.  Concern for possible seizure as well. [ML]    Clinical Course User Index [ML] Rosine Abe, MD  Medical Decision Making Amount and/or Complexity of Data Reviewed Labs: ordered. Radiology: ordered.  Risk Decision regarding hospitalization.    81 year old female with past medical history of hypertension, GERD, anxiety, and blindness presenting due to altered mental status and not following commands appropriately.  Generalized weakness.  Presented as a code stroke.  Last known to be normal approximately 4.5 hours prior to arrival.  On arrival DNR and MOST form was reviewed.  Patient does not wish to be intubated.  Differential diagnosis includes seizure, CVA, TIA, infection, electrolyte abnormalities, medication side effect.  Evaluated by neurology and CT head noncontrast was obtained.  It did not show any acute hemorrhage.  Patient began to rapidly improve.  On reevaluation at approximately 6 PM, patient is awake, alert, and able to answer questions appropriately.  She appears to have equal strength in her bilateral extremities. Her labs reviewed and found to be reassuring.  CBG 96, no leukocytosis, significant anemia, AKI, or significant electrolyte abnormalities.  Due to rapid improvement after altered mental status without intervention, neurology concern for possible seizure, however they would like to obtain further stroke work-up including MRI.  Aspirin and Plavix given.  Patient was admitted to hospitalist service for continued work-up.        Final Clinical Impression(s) / ED Diagnoses Final diagnoses:  None    Rx / DC Orders ED Discharge Orders     None         Rosine Abe, MD 06/07/22 2136    Ezequiel Essex, MD 06/08/22 1124

## 2022-06-07 NOTE — Progress Notes (Signed)
IV team RN assessed bilateral upper extremities with ultrasound, pt has very poor vasculature. Able to obtain access without ultrasound on the right arm at this time. However, should patient require further access, a central line would be recommended for this patient.

## 2022-06-08 ENCOUNTER — Observation Stay (HOSPITAL_BASED_OUTPATIENT_CLINIC_OR_DEPARTMENT_OTHER): Payer: Medicare Other

## 2022-06-08 ENCOUNTER — Observation Stay (HOSPITAL_COMMUNITY): Payer: Medicare Other

## 2022-06-08 DIAGNOSIS — Z9189 Other specified personal risk factors, not elsewhere classified: Secondary | ICD-10-CM

## 2022-06-08 DIAGNOSIS — G459 Transient cerebral ischemic attack, unspecified: Secondary | ICD-10-CM | POA: Diagnosis not present

## 2022-06-08 DIAGNOSIS — F0390 Unspecified dementia without behavioral disturbance: Secondary | ICD-10-CM

## 2022-06-08 DIAGNOSIS — I10 Essential (primary) hypertension: Secondary | ICD-10-CM | POA: Diagnosis not present

## 2022-06-08 DIAGNOSIS — F418 Other specified anxiety disorders: Secondary | ICD-10-CM | POA: Diagnosis not present

## 2022-06-08 DIAGNOSIS — R569 Unspecified convulsions: Secondary | ICD-10-CM | POA: Diagnosis not present

## 2022-06-08 DIAGNOSIS — G9340 Encephalopathy, unspecified: Secondary | ICD-10-CM

## 2022-06-08 LAB — ECHOCARDIOGRAM COMPLETE
AR max vel: 3.35 cm2
AV Area VTI: 3.6 cm2
AV Area mean vel: 3.48 cm2
AV Mean grad: 3 mmHg
AV Peak grad: 3.9 mmHg
Ao pk vel: 0.99 m/s
Area-P 1/2: 3.19 cm2
Weight: 2426.82 oz

## 2022-06-08 LAB — COMPREHENSIVE METABOLIC PANEL
ALT: 19 U/L (ref 0–44)
AST: 34 U/L (ref 15–41)
Albumin: 3.8 g/dL (ref 3.5–5.0)
Alkaline Phosphatase: 98 U/L (ref 38–126)
Anion gap: 10 (ref 5–15)
BUN: 21 mg/dL (ref 8–23)
CO2: 25 mmol/L (ref 22–32)
Calcium: 9.1 mg/dL (ref 8.9–10.3)
Chloride: 104 mmol/L (ref 98–111)
Creatinine, Ser: 0.92 mg/dL (ref 0.44–1.00)
GFR, Estimated: >60 mL/min (ref >60)
Glucose, Bld: 102 mg/dL — ABNORMAL HIGH (ref 70–99)
Potassium: 4.8 mmol/L (ref 3.5–5.1)
Sodium: 139 mmol/L (ref 135–145)
Total Bilirubin: 0.6 mg/dL (ref 0.3–1.2)
Total Protein: 6.7 g/dL (ref 6.5–8.1)

## 2022-06-08 LAB — LIPID PANEL
Cholesterol: 201 mg/dL — ABNORMAL HIGH (ref 0–200)
HDL: 71 mg/dL (ref >40)
LDL Cholesterol: 113 mg/dL — ABNORMAL HIGH (ref 0–99)
Total CHOL/HDL Ratio: 2.8 RATIO
Triglycerides: 84 mg/dL (ref <150)
VLDL: 17 mg/dL (ref 0–40)

## 2022-06-08 LAB — HEMOGLOBIN A1C
Hgb A1c MFr Bld: 5.6 % (ref 4.8–5.6)
Mean Plasma Glucose: 114.02 mg/dL

## 2022-06-08 LAB — CBC
HCT: 39.3 % (ref 36.0–46.0)
Hemoglobin: 12.8 g/dL (ref 12.0–15.0)
MCH: 31.1 pg (ref 26.0–34.0)
MCHC: 32.6 g/dL (ref 30.0–36.0)
MCV: 95.6 fL (ref 80.0–100.0)
Platelets: 333 10*3/uL (ref 150–400)
RBC: 4.11 MIL/uL (ref 3.87–5.11)
RDW: 13.3 % (ref 11.5–15.5)
WBC: 9.2 10*3/uL (ref 4.0–10.5)
nRBC: 0 % (ref 0.0–0.2)

## 2022-06-08 LAB — PHOSPHORUS: Phosphorus: 3.8 mg/dL (ref 2.5–4.6)

## 2022-06-08 LAB — MAGNESIUM: Magnesium: 2.3 mg/dL (ref 1.7–2.4)

## 2022-06-08 MED ORDER — LORAZEPAM 0.5 MG PO TABS
0.5000 mg | ORAL_TABLET | Freq: Two times a day (BID) | ORAL | Status: DC
  Administered 2022-06-08 – 2022-06-09 (×2): 0.5 mg via ORAL
  Filled 2022-06-08 (×2): qty 1

## 2022-06-08 MED ORDER — FAMOTIDINE 20 MG PO TABS
20.0000 mg | ORAL_TABLET | Freq: Every day | ORAL | Status: DC
  Administered 2022-06-08: 20 mg via ORAL
  Filled 2022-06-08: qty 1

## 2022-06-08 MED ORDER — GADOPICLENOL 0.5 MMOL/ML IV SOLN
6.5000 mL | Freq: Once | INTRAVENOUS | Status: AC | PRN
  Administered 2022-06-08: 6.5 mL via INTRAVENOUS

## 2022-06-08 MED ORDER — ACETAMINOPHEN 325 MG PO TABS
650.0000 mg | ORAL_TABLET | Freq: Three times a day (TID) | ORAL | Status: DC
  Administered 2022-06-08 – 2022-06-09 (×2): 650 mg via ORAL
  Filled 2022-06-08 (×3): qty 2

## 2022-06-08 MED ORDER — SODIUM CHLORIDE 0.9 % IV SOLN: INTRAVENOUS | Status: AC

## 2022-06-08 MED ORDER — PERFLUTREN LIPID MICROSPHERE
1.0000 mL | INTRAVENOUS | Status: AC | PRN
  Administered 2022-06-08: 4 mL via INTRAVENOUS

## 2022-06-08 MED ORDER — BISACODYL 5 MG PO TBEC: 5.0000 mg | DELAYED_RELEASE_TABLET | Freq: Every day | ORAL | Status: DC | PRN

## 2022-06-08 MED ORDER — MEMANTINE HCL 10 MG PO TABS
10.0000 mg | ORAL_TABLET | Freq: Two times a day (BID) | ORAL | Status: DC
  Administered 2022-06-08 – 2022-06-09 (×2): 10 mg via ORAL
  Filled 2022-06-08 (×2): qty 1

## 2022-06-08 NOTE — Progress Notes (Signed)
Occupational Therapy Treatment Patient Details Name: Jean [name] MRN: [id number] DOB: 05/08/1941 Today's Date: 06/08/2022   History of present illness 81 yo female resides at SNF admitted AMS and not following commands.  CT negative, PMH HTN GERD anxiety blindness diverticulosis, hemorrhoids, HLD, hypertonicity of bladder, optic neuritis, osteoporosis, trigeminal neuralgia   OT comments  Pt requires (A) for rolling and due to confusion insist her head is elevated on a flat bed surface. Pt continues to report need to (A) and accurately using call button now with adaptation of raised button with telemetry pad. Pt does not retain education for positioning and continued to insist need. Rn / tech aware. Pt repositioned on side for trail in new position in attempt to increase comfort. Recommendation for SNF   Recommendations for follow up therapy are one component of a multi-disciplinary discharge planning process, led by the attending physician.  Recommendations may be updated based on patient status, additional functional criteria and insurance authorization.    Follow Up Recommendations  Skilled nursing-short term rehab (<3 hours/day)    Assistance Recommended at Discharge Frequent or constant Supervision/Assistance  Patient can return home with the following  Two people to help with walking and/or transfers;Two people to help with bathing/dressing/bathroom   Equipment Recommendations  Wheelchair cushion (measurements OT);Wheelchair (measurements OT);Hospital bed    Recommendations for Other Services PT consult    Precautions / Restrictions Precautions Precautions: Fall       Mobility Bed Mobility Overal bed mobility: Needs Assistance Bed Mobility: Rolling Rolling: Max assist         General bed mobility comments: Pt requires (A) to sustain side lying and total +2 to scoot pt slightly up in the bed. pt with sheets pulled tight in all corners to ensure that all material is  completely flat. pt continues to say her head needs to be lowered and something is behind her back. pt positioned on R side to attempt new positioning for comfort. Pt did not tolerate towel roll for support and reaching posture iwth L UE to move it.    Transfers                   General transfer comment: not attempt     Balance Overall balance assessment: Needs assistance                                         ADL either performed or assessed with clinical judgement   ADL Overall ADL's : Needs assistance/impaired Eating/Feeding: Moderate assistance   Grooming: Moderate assistance   Upper Body Bathing: Maximal assistance   Lower Body Bathing: Maximal assistance   Upper Body Dressing : Maximal assistance   Lower Body Dressing: Maximal assistance                 General ADL Comments: unable to attempt oob task    Extremity/Trunk Assessment Upper Extremity Assessment Upper Extremity Assessment: LUE deficits/detail LUE Deficits / Details: resistant to Ot touching UE to move blankets no complaint of pain at this time   Lower Extremity Assessment Lower Extremity Assessment: Defer to PT evaluation   Cervical / Trunk Assessment Cervical / Trunk Assessment: Kyphotic    Vision Baseline Vision/History: 2 Legally blind Ability to See in Adequate Light: 4 Severely impaired Additional Comments: pt turning head to the R and able to correctly reports 2 digits with increased light. pt  reports staff is wearing glasses and staff is not   Perception     Praxis      Cognition Arousal/Alertness: Awake/alert Behavior During Therapy: Flat affect Overall Cognitive Status: Impaired/Different from baseline Area of Impairment: Orientation, Attention, Awareness                 Orientation Level: Disoriented to, Place, Time, Situation Current Attention Level: Sustained       Awareness: Intellectual   General Comments: pt is completely flat in  the bed telling staff that she needs to have her head flatten. pt educated that the bed was flat multiple times by staff prior to return to room for this session. pt recognized OT voice and calling OT "little boy" though Orville Govern is not female. Pt insist that her head is not flat and something is behind her back.        Exercises      Shoulder Instructions       General Comments skin check with no concerns. pt with purewick at this time    Pertinent Vitals/ Pain       Pain Assessment Pain Assessment: Faces Faces Pain Scale: Hurts whole lot Pain Location: back Pain Descriptors / Indicators: Discomfort Pain Intervention(s): Monitored during session, Patient requesting pain meds-RN notified  Home Living Family/patient expects to be discharged to:: Skilled nursing facility                                 Additional Comments: from [facility] based on address      Prior Functioning/Environment              Frequency  Min 2X/week        Progress Toward Goals  OT Goals(current goals can now be found in the care plan section)  Progress towards OT goals: Not progressing toward goals - comment  Acute Rehab OT Goals Patient Stated Goal: to lay flat OT Goal Formulation: Patient unable to participate in goal setting Time For Goal Achievement: 06/22/22 Potential to Achieve Goals: Good  Plan Discharge plan remains appropriate    Co-evaluation                 AM-PAC OT "6 Clicks" Daily Activity     Outcome Measure   Help from another person eating meals?: A Lot Help from another person taking care of personal grooming?: A Lot Help from another person toileting, which includes using toliet, bedpan, or urinal?: Total Help from another person bathing (including washing, rinsing, drying)?: Total Help from another person to put on and taking off regular upper body clothing?: A Lot Help from another person to put on and taking off regular lower body clothing?:  Total 6 Click Score: 9    End of Session    OT Visit Diagnosis: Unsteadiness on feet (R26.81);Muscle weakness (generalized) (M62.81);Low vision, both eyes (H54.2)   Activity Tolerance Patient tolerated treatment well   Patient Left in bed;with call bell/phone within reach   Nurse Communication Mobility status;Precautions        Time:  [phone])-5038 OT Time Calculation (min): 11 min  Charges: OT General Charges $OT Visit: 1 Visit OT Evaluation $OT Eval Moderate Complexity: 1 Mod OT Treatments $Self Care/Home Management : 8-22 mins   Brynn, OTR/L  Acute Rehabilitation Services Office: [phone] .   Jeri Modena 06/08/2022, 12:06 PM

## 2022-06-08 NOTE — ED Notes (Signed)
Pt has been calling out multiple times myself the NT as well as the OT have gone in and no matter what position we place her in we cannot get her comfortable .  She refused to work with OT

## 2022-06-08 NOTE — Progress Notes (Signed)
PROGRESS NOTE  Jean [name] GLO:[id number] DOB: Sep 11, 1941   PCP: [name], MD  Patient is from: Nursing home.  Reportedly able to ambulate and carry on conversation at baseline.  DOA: 06/07/2022 LOS: 0  Chief complaints Chief Complaint  Patient presents with   Code Stroke     Brief Narrative / Interim history: 81 year old F with PMH of carotid artery disease, HTN, HLD, GERD, blindness, trigeminal neuralgia, neuropathy and anxiety brought to ED by EMS from nursing facility due to altered mental status.  Reportedly found facedown on the bed and was poorly responsive to verbal stimuli and physical stimuli about 3:30 PM, and EMS was called.  In ED, slightly hypertensive.  Other vitals within normal.  Labs including BMP, CBC, UA, influenza A, B and COVID-19 without significant finding.  CT head without acute finding.  UDS positive for benzo but patient is on Ativan.  Neurology consulted.  MRI brain, MRA head and neck and EEG ordered.  Patient was admitted for CVA work-up and rule out seizure.   MRI brain, MRA head and neck without acute finding.  EEG with excessive beta activity seen in background most likely due to effect of benzodiazepines but no seizure or epileptiform discharge.  Mental status seems to have improved.  Subjective: Seen and examined earlier this morning.  She has no complaints but not a great historian.  She is awake and oriented to self and month.  She follows command.  She denies pain, shortness of breath, GI or UTI symptoms.  Objective: Vitals:   06/08/22 0333 06/08/22 0600 06/08/22 0800 06/08/22 1102  BP: (!) 140/51 136/74 122/63 119/64  Pulse: 67 72 78 76  Resp: '12 11 19 13  '$ Temp: 97.7 F (36.5 C) 97.9 F (36.6 C) 98.3 F (36.8 C)   TempSrc: Oral Oral Oral   SpO2: 100% 94% 96% 96%  Weight:        Examination:  GENERAL: No apparent distress.  Nontoxic. HEENT: MMM.  Hearing grossly intact.  Blind. NECK: Supple.  No apparent JVD.  RESP:  No  IWOB.  Fair aeration bilaterally. CVS:  RRR. Heart sounds normal.  ABD/GI/GU: BS+. Abd soft, NTND.  MSK/EXT:  Moves extremities. No apparent deformity. No edema.  SKIN: no apparent skin lesion or wound NEURO: Awake.  Oriented to self and month.  Follows command.  No facial asymmetry.  Motor, sensory and reflex symmetric. PSYCH: Calm. Normal affect.   Procedures:  None  Microbiology summarized: [id number], influenza A and B PCR nonreactive.  Assessment and plan: Principal Problem:   Acute encephalopathy Active Problems:   Depression with anxiety   Essential hypertension   GERD (gastroesophageal reflux disease)   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HCC)   Dehydration   At risk for polypharmacy  Acute encephalopathy: Likely toxic and iatrogenic in patient with "senile dementia".  Patient is on multiple sedating medication including Elavil, Librium, Prolixin, Norco and Ativan.  Per neuro, had NIHSS of 13 on arrival that has improved to 4 quickly.  Also noted to have mild LUE drift.  Work-up including basic labs, CT head, MRI brain, MRA head and neck and EEG without significant finding.  Low suspicion for infection.  No focal neurodeficit on my exam this morning.  Encephalopathy seems to have improved.  She is awake and oriented to self and month.  She follows commands.  Per H&P, ambulate and carry on conversation at baseline.  Attempted to call patient's son for more information but no answer. -Neurology following -Hold sedating medications -  Ativan 0.5 mg twice daily due to withdrawal risk.  May consider Klonopin if she has to stay on benzo. -Continue memantine when able to take p.o. safely. -Reorientation and delirium precautions -Per SLP, patient passed [facility] swallow screen.  Ordered regular diet. -PT/OT  Dehydration: Seems to have improved. -Continue IV hydration for now.   Essential hypertension: Normotensive.  Not on antihypertensive meds except Lasix and Aldactone. -Doubt need for permissive  hypertension -Hold diuretics   GERD -Continue famotidine   Anxiety and depression: Stable -Continue lorazepam due to risk of withdrawal. -Hold Elavil, Librium, Prolixin and Norco  Hyperlipidemia: LDL 113.  Given age, I doubt benefit from a statin for primary prevention  At risk for polypharmacy: On Elavil, Librium, Prolixin, Norco and Ativan -Holding Elavil, Librium, Prolixin and Norco for now -Continue Ativan 0.5 mg twice daily -Schedule Tylenol for pain  DO NOT RESUSCITATE: Present on admission.  Body mass index is 25.24 kg/m.           DVT prophylaxis:  SCDs Start: 06/07/22 2206  Code Status: DNR/DNI Family Communication: Attempted to call patient's son for update but no answer. Level of care: Med-Surg Status is: Observation The patient will require care spanning > 2 midnights and should be moved to inpatient because: Acute toxic encephalopathy   Final disposition: Likely back to nursing home Consultants:  Neurology  Sch Meds:  Scheduled Meds:   stroke: early stages of recovery book   Does not apply Once   acetaminophen  650 mg Oral Q8H   aspirin  81 mg Oral Daily   Or   aspirin  300 mg Rectal Daily   clopidogrel  75 mg Oral Daily   famotidine  20 mg Oral QHS   LORazepam  0.5 mg Oral BID   memantine  10 mg Oral BID   Continuous Infusions:  sodium chloride     PRN Meds:.acetaminophen **OR** acetaminophen, bisacodyl  Antimicrobials: Anti-infectives (From admission, onward)    None        I have personally reviewed the following labs and images: CBC: Recent Labs  Lab 06/07/22 1641 06/07/22 1646 06/08/22 0032  WBC 6.4  --  9.2  NEUTROABS 3.1  --   --   HGB 15.1* 16.3* 12.8  HCT 46.3* 48.0* 39.3  MCV 96.9  --  95.6  PLT 303  --  333   BMP &GFR Recent Labs  Lab 06/07/22 1641 06/07/22 1646 06/08/22 0032  NA 139 138 139  K 4.7 5.0 4.8  CL 101 103 104  CO2 25  --  25  GLUCOSE 98 97 102*  BUN 19 27* 21  CREATININE 1.16* 1.00 0.92   CALCIUM 9.5  --  9.1  MG  --   --  2.3  PHOS  --   --  3.8   Estimated Creatinine Clearance: 45.9 mL/min (by C-G formula based on SCr of 0.92 mg/dL). Liver & Pancreas: Recent Labs  Lab 06/07/22 1641 06/08/22 0032  AST 29 34  ALT 18 19  ALKPHOS 100 98  BILITOT 0.3 0.6  PROT 7.2 6.7  ALBUMIN 4.0 3.8   No results for input(s): "LIPASE", "AMYLASE" in the last 168 hours. No results for input(s): "AMMONIA" in the last 168 hours. Diabetic: Recent Labs    06/08/22 0032  HGBA1C 5.6   Recent Labs  Lab 06/07/22 1637  GLUCAP 96   Cardiac Enzymes: No results for input(s): "CKTOTAL", "CKMB", "CKMBINDEX", "TROPONINI" in the last 168 hours. No results for input(s): "PROBNP" in  the last 8760 hours. Coagulation Profile: Recent Labs  Lab 06/07/22 1641  INR 0.9   Thyroid Function Tests: No results for input(s): "TSH", "T4TOTAL", "FREET4", "T3FREE", "THYROIDAB" in the last 72 hours. Lipid Profile: Recent Labs    06/08/22 0032  CHOL 201*  HDL 71  LDLCALC 113*  TRIG 84  CHOLHDL 2.8   Anemia Panel: No results for input(s): "VITAMINB12", "FOLATE", "FERRITIN", "TIBC", "IRON", "RETICCTPCT" in the last 72 hours. Urine analysis:    Component Value Date/Time   COLORURINE STRAW (A) 06/07/2022 1641   APPEARANCEUR CLEAR 06/07/2022 1641   LABSPEC 1.005 06/07/2022 1641   PHURINE 8.0 06/07/2022 1641   GLUCOSEU NEGATIVE 06/07/2022 1641   GLUCOSEU NEGATIVE 08/06/2014 1122   GLUCOSEU NEGATIVE 08/06/2014 1122   HGBUR NEGATIVE 06/07/2022 1641   BILIRUBINUR NEGATIVE 06/07/2022 1641   KETONESUR NEGATIVE 06/07/2022 1641   PROTEINUR NEGATIVE 06/07/2022 1641   UROBILINOGEN 0.2 08/06/2014 1122   UROBILINOGEN 0.2 08/06/2014 1122   NITRITE NEGATIVE 06/07/2022 1641   LEUKOCYTESUR NEGATIVE 06/07/2022 1641   Sepsis Labs: Invalid input(s): "PROCALCITONIN", "LACTICIDVEN"  Microbiology: Recent Results (from the past 240 hour(s))  Resp Panel by RT-PCR (Flu A&B, Covid) Anterior Nasal Swab      Status: None   Collection Time: 06/07/22  4:41 PM   Specimen: Anterior Nasal Swab  Result Value Ref Range Status   SARS Coronavirus 2 by RT PCR NEGATIVE NEGATIVE Final    Comment: (NOTE) SARS-CoV-2 target nucleic acids are NOT DETECTED.  The SARS-CoV-2 RNA is generally detectable in upper respiratory specimens during the acute phase of infection. The lowest concentration of SARS-CoV-2 viral copies this assay can detect is 138 copies/mL. A negative result does not preclude SARS-Cov-2 infection and should not be used as the sole basis for treatment or other patient management decisions. A negative result may occur with  improper specimen collection/handling, submission of specimen other than nasopharyngeal swab, presence of viral mutation(s) within the areas targeted by this assay, and inadequate number of viral copies(<138 copies/mL). A negative result must be combined with clinical observations, patient history, and epidemiological information. The expected result is Negative.  Fact Sheet for Patients:  EntrepreneurPulse.com.au  Fact Sheet for Healthcare Providers:  IncredibleEmployment.be  This test is no t yet approved or cleared by the [place] FDA and  has been authorized for detection and/or diagnosis of SARS-CoV-2 by FDA under an Emergency Use Authorization (EUA). This EUA will remain  in effect (meaning this test can be used) for the duration of the COVID-19 declaration under Section 564(b)(1) of the Act, 21 U.S.C.section 360bbb-3(b)(1), unless the authorization is terminated  or revoked sooner.       Influenza A by PCR NEGATIVE NEGATIVE Final   Influenza B by PCR NEGATIVE NEGATIVE Final    Comment: (NOTE) The Xpert Xpress SARS-CoV-2/FLU/RSV plus assay is intended as an aid in the diagnosis of influenza from Nasopharyngeal swab specimens and should not be used as a sole basis for treatment. Nasal washings and aspirates are  unacceptable for Xpert Xpress SARS-CoV-2/FLU/RSV testing.  Fact Sheet for Patients: EntrepreneurPulse.com.au  Fact Sheet for Healthcare Providers: IncredibleEmployment.be  This test is not yet approved or cleared by the [place] FDA and has been authorized for detection and/or diagnosis of SARS-CoV-2 by FDA under an Emergency Use Authorization (EUA). This EUA will remain in effect (meaning this test can be used) for the duration of the COVID-19 declaration under Section 564(b)(1) of the Act, 21 U.S.C. section 360bbb-3(b)(1), unless the authorization is terminated or  revoked.  Performed at [facility], [place] [address] [place], [place] [address]     Radiology Studies: EEG adult  Result Date: June 20, 2022 Lora Havens, MD     06-20-22  5:14 AM Patient Name: Jean [name] MRN: [id number] Epilepsy Attending: Lora Havens Referring Physician/Provider: Derek Jack, MD Date: Jun 20, 2022 Duration: 21.48 mins Patient history: 81yo F with ams. EEG to evaluate for seizure. Level of alertness: Awake AEDs during EEG study: Ativan, Librium Technical aspects: This EEG study was done with scalp electrodes positioned according to the 10-20 International system of electrode placement. Electrical activity was reviewed with band pass filter of 1-'70Hz'$ , sensitivity of 7 uV/mm, display speed of 69m/sec with a '60Hz'$  notched filter applied as appropriate. EEG data were recorded continuously and digitally stored.  Video monitoring was available and reviewed as appropriate. Description: No posterior dominant rhythm was seen. There was an excessive amount of 15 to 18 Hz beta activity distributed symmetrically and diffusely.  Hyperventilation and photic stimulation were not performed.   ABNORMALITY - Excessive beta, generalized IMPRESSION: This study is within normal limits. The excessive beta activity seen in the background is most likely due to the effect of  benzodiazepine and is a benign EEG pattern. No seizures or epileptiform discharges were seen throughout the recording. A normal interictal EEG does not exclude nor support the diagnosis of epilepsy. PLora Havens  MR BRAIN W WO CONTRAST  Result Date: 909/27/23CLINICAL DATA:  Transient ischemic attack.  New onset seizure. EXAM: MRI HEAD WITHOUT AND WITH CONTRAST MRA HEAD WITHOUT CONTRAST MRA NECK WITHOUT AND WITH CONTRAST TECHNIQUE: Multiplanar, multiecho pulse sequences of the brain and surrounding structures were obtained without and with intravenous contrast. Angiographic images of the Circle of Willis were obtained using MRA technique without intravenous contrast. Angiographic images of the neck were obtained using MRA technique without and with intravenous contrast. Carotid stenosis measurements (when applicable) are obtained utilizing NASCET criteria, using the distal internal carotid diameter as the denominator. CONTRAST:  6.5 mL gadopiclenol (Vueway) 0.5 mmol/ml solution COMPARISON:  None Available. FINDINGS: MRI HEAD FINDINGS Brain: No acute infarct, mass effect or extra-axial collection. No acute or chronic hemorrhage. There is multifocal hyperintense T2-weighted signal within the white matter. Generalized volume loss. The midline structures are normal. There is no abnormal contrast enhancement. Vascular: Major flow voids are preserved. Skull and upper cervical spine: Normal calvarium and skull base. Visualized upper cervical spine and soft tissues are normal. Sinuses/Orbits:No paranasal sinus fluid levels or advanced mucosal thickening. No mastoid or middle ear effusion. Normal orbits. MRA HEAD FINDINGS POSTERIOR CIRCULATION: --Vertebral arteries: Normal --Inferior cerebellar arteries: Normal. --Basilar artery: Normal. --Superior cerebellar arteries: Normal. --Posterior cerebral arteries: Normal. ANTERIOR CIRCULATION: --Intracranial internal carotid arteries: Normal. --Anterior cerebral arteries  (ACA): Normal. --Middle cerebral arteries (MCA): At the right MCA bifurcation, there is a laterally projecting outpouching that measures 2 mm. This may be a small aneurysm or a chronically occluded branch vessel. Otherwise, the middle cerebral arteries are normal. ANATOMIC VARIANTS: None MRA NECK FINDINGS Left dominant vertebral arteries are patent and of normal caliber. Both carotid systems are normal without stenosis. IMPRESSION: 1. No acute intracranial abnormality. 2. 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 3. No emergent large vessel occlusion or high-grade stenosis. 4. A 2 mm laterally projecting outpouching at the right MCA bifurcation may be a small aneurysm or an occluded branch vessel. Given the lack of acute ischemia, this is unlikely to be an acute occlusion.  Electronically Signed   By: Ulyses Jarred M.D.   On: 06/08/2022 01:42   MR ANGIO HEAD WO CONTRAST  Result Date: 06/08/2022 CLINICAL DATA:  Transient ischemic attack.  New onset seizure. EXAM: MRI HEAD WITHOUT AND WITH CONTRAST MRA HEAD WITHOUT CONTRAST MRA NECK WITHOUT AND WITH CONTRAST TECHNIQUE: Multiplanar, multiecho pulse sequences of the brain and surrounding structures were obtained without and with intravenous contrast. Angiographic images of the Circle of Willis were obtained using MRA technique without intravenous contrast. Angiographic images of the neck were obtained using MRA technique without and with intravenous contrast. Carotid stenosis measurements (when applicable) are obtained utilizing NASCET criteria, using the distal internal carotid diameter as the denominator. CONTRAST:  6.5 mL gadopiclenol (Vueway) 0.5 mmol/ml solution COMPARISON:  None Available. FINDINGS: MRI HEAD FINDINGS Brain: No acute infarct, mass effect or extra-axial collection. No acute or chronic hemorrhage. There is multifocal hyperintense T2-weighted signal within the white matter. Generalized volume loss. The midline structures are normal. There  is no abnormal contrast enhancement. Vascular: Major flow voids are preserved. Skull and upper cervical spine: Normal calvarium and skull base. Visualized upper cervical spine and soft tissues are normal. Sinuses/Orbits:No paranasal sinus fluid levels or advanced mucosal thickening. No mastoid or middle ear effusion. Normal orbits. MRA HEAD FINDINGS POSTERIOR CIRCULATION: --Vertebral arteries: Normal --Inferior cerebellar arteries: Normal. --Basilar artery: Normal. --Superior cerebellar arteries: Normal. --Posterior cerebral arteries: Normal. ANTERIOR CIRCULATION: --Intracranial internal carotid arteries: Normal. --Anterior cerebral arteries (ACA): Normal. --Middle cerebral arteries (MCA): At the right MCA bifurcation, there is a laterally projecting outpouching that measures 2 mm. This may be a small aneurysm or a chronically occluded branch vessel. Otherwise, the middle cerebral arteries are normal. ANATOMIC VARIANTS: None MRA NECK FINDINGS Left dominant vertebral arteries are patent and of normal caliber. Both carotid systems are normal without stenosis. IMPRESSION: 1. No acute intracranial abnormality. 2. 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 3. No emergent large vessel occlusion or high-grade stenosis. 4. A 2 mm laterally projecting outpouching at the right MCA bifurcation may be a small aneurysm or an occluded branch vessel. Given the lack of acute ischemia, this is unlikely to be an acute occlusion. Electronically Signed   By: Ulyses Jarred M.D.   On: 06/08/2022 01:42   MR ANGIO NECK W WO CONTRAST  Result Date: 06/08/2022 CLINICAL DATA:  Transient ischemic attack.  New onset seizure. EXAM: MRI HEAD WITHOUT AND WITH CONTRAST MRA HEAD WITHOUT CONTRAST MRA NECK WITHOUT AND WITH CONTRAST TECHNIQUE: Multiplanar, multiecho pulse sequences of the brain and surrounding structures were obtained without and with intravenous contrast. Angiographic images of the Circle of Willis were obtained using MRA  technique without intravenous contrast. Angiographic images of the neck were obtained using MRA technique without and with intravenous contrast. Carotid stenosis measurements (when applicable) are obtained utilizing NASCET criteria, using the distal internal carotid diameter as the denominator. CONTRAST:  6.5 mL gadopiclenol (Vueway) 0.5 mmol/ml solution COMPARISON:  None Available. FINDINGS: MRI HEAD FINDINGS Brain: No acute infarct, mass effect or extra-axial collection. No acute or chronic hemorrhage. There is multifocal hyperintense T2-weighted signal within the white matter. Generalized volume loss. The midline structures are normal. There is no abnormal contrast enhancement. Vascular: Major flow voids are preserved. Skull and upper cervical spine: Normal calvarium and skull base. Visualized upper cervical spine and soft tissues are normal. Sinuses/Orbits:No paranasal sinus fluid levels or advanced mucosal thickening. No mastoid or middle ear effusion. Normal orbits. MRA HEAD FINDINGS POSTERIOR CIRCULATION: --Vertebral arteries: Normal --Inferior  cerebellar arteries: Normal. --Basilar artery: Normal. --Superior cerebellar arteries: Normal. --Posterior cerebral arteries: Normal. ANTERIOR CIRCULATION: --Intracranial internal carotid arteries: Normal. --Anterior cerebral arteries (ACA): Normal. --Middle cerebral arteries (MCA): At the right MCA bifurcation, there is a laterally projecting outpouching that measures 2 mm. This may be a small aneurysm or a chronically occluded branch vessel. Otherwise, the middle cerebral arteries are normal. ANATOMIC VARIANTS: None MRA NECK FINDINGS Left dominant vertebral arteries are patent and of normal caliber. Both carotid systems are normal without stenosis. IMPRESSION: 1. No acute intracranial abnormality. 2. 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 3. No emergent large vessel occlusion or high-grade stenosis. 4. A 2 mm laterally projecting outpouching at the right  MCA bifurcation may be a small aneurysm or an occluded branch vessel. Given the lack of acute ischemia, this is unlikely to be an acute occlusion. Electronically Signed   By: Ulyses Jarred M.D.   On: 06/08/2022 01:42   CT HEAD CODE STROKE WO CONTRAST  Result Date: 06/07/2022 CLINICAL DATA:  Code stroke.  Neuro deficit, acute, stroke suspected EXAM: CT HEAD WITHOUT CONTRAST TECHNIQUE: Contiguous axial images were obtained from the base of the skull through the vertex without intravenous contrast. RADIATION DOSE REDUCTION: This exam was performed according to the departmental dose-optimization program which includes automated exposure control, adjustment of the mA and/or kV according to patient size and/or use of iterative reconstruction technique. COMPARISON:  CT head March 10, 2021. FINDINGS: Brain: No evidence of acute large vascular territory infarction, hemorrhage, hydrocephalus, extra-axial collection or mass lesion/mass effect. Mild for age patchy white matter hypoattenuation, nonspecific but compatible with chronic microvascular ischemic disease. Vascular: No hyperdense vessel identified. Calcific intracranial atherosclerosis. Skull: No acute fracture. Sinuses/Orbits: No acute findings. ASPECTS [facility] Stroke Program Early CT Score) total score (0-10 with 10 being normal): 10. IMPRESSION: 1. No evidence of acute intracranial abnormality. 2. ASPECTS is 10. Code stroke imaging results were communicated on 06/07/2022 at 4:52 pm to provider Dr. Quinn Axe Via secure text paging. Electronically Signed   By: Margaretha Sheffield M.D.   On: 06/07/2022 16:52      Jadden Yim T. Everton  If 7PM-7AM, please contact night-coverage [email or web address] 06/08/2022, 12:32 PM

## 2022-06-08 NOTE — Progress Notes (Signed)
SLP Cancellation Note  Patient Details Name: CLEONA DOUBLEDAY MRN: [id number] DOB: 30-Oct-1940   Cancelled treatment:       Reason Eval/Treat Not Completed: (P)  (Order for speech/language eval received, Will conduct as soon as able.  pt passed [facility] swallow screen.  If dysphagia is concern, please order swallow eval.  TY.)  Kathleen Lime, MS [facility] SLP Acute Rehab Services Office [phone] Pager [phone]   Macario Golds 06/08/2022, 11:50 AM

## 2022-06-08 NOTE — ED Notes (Signed)
EEG at bedside.

## 2022-06-08 NOTE — Procedures (Signed)
Patient Name: Jean [name]  MRN: [id number]  Epilepsy Attending: Lora Havens  Referring Physician/Provider: Derek Jack, MD  Date: 06/08/2022 Duration: 21.48 mins  Patient history: 81yo F with ams. EEG to evaluate for seizure.  Level of alertness: Awake  AEDs during EEG study: Ativan, Librium  Technical aspects: This EEG study was done with scalp electrodes positioned according to the 10-20 International system of electrode placement. Electrical activity was reviewed with band pass filter of 1-'70Hz'$ , sensitivity of 7 uV/mm, display speed of 71m/sec with a '60Hz'$  notched filter applied as appropriate. EEG data were recorded continuously and digitally stored.  Video monitoring was available and reviewed as appropriate.  Description: No posterior dominant rhythm was seen. There was an excessive amount of 15 to 18 Hz beta activity distributed symmetrically and diffusely.  Hyperventilation and photic stimulation were not performed.     ABNORMALITY - Excessive beta, generalized  IMPRESSION: This study is within normal limits. The excessive beta activity seen in the background is most likely due to the effect of benzodiazepine and is a benign EEG pattern. No seizures or epileptiform discharges were seen throughout the recording.  A normal interictal EEG does not exclude nor support the diagnosis of epilepsy.   Dandrea Widdowson OBarbra Sarks

## 2022-06-08 NOTE — Progress Notes (Signed)
  Echocardiogram 2D Echocardiogram has been performed.  Jean [name] 06/08/2022, 5:33 PM

## 2022-06-08 NOTE — Progress Notes (Signed)
EEG complete - results pending 

## 2022-06-09 DIAGNOSIS — K219 Gastro-esophageal reflux disease without esophagitis: Secondary | ICD-10-CM | POA: Diagnosis not present

## 2022-06-09 DIAGNOSIS — Z9189 Other specified personal risk factors, not elsewhere classified: Secondary | ICD-10-CM | POA: Diagnosis not present

## 2022-06-09 DIAGNOSIS — G9340 Encephalopathy, unspecified: Secondary | ICD-10-CM | POA: Diagnosis not present

## 2022-06-09 DIAGNOSIS — F418 Other specified anxiety disorders: Secondary | ICD-10-CM | POA: Diagnosis not present

## 2022-06-09 LAB — RENAL FUNCTION PANEL
Albumin: 3.5 g/dL (ref 3.5–5.0)
Anion gap: 12 (ref 5–15)
BUN: 14 mg/dL (ref 8–23)
CO2: 22 mmol/L (ref 22–32)
Calcium: 9.4 mg/dL (ref 8.9–10.3)
Chloride: 107 mmol/L (ref 98–111)
Creatinine, Ser: 0.95 mg/dL (ref 0.44–1.00)
GFR, Estimated: 60 mL/min — ABNORMAL LOW (ref >60)
Glucose, Bld: 99 mg/dL (ref 70–99)
Phosphorus: 3.3 mg/dL (ref 2.5–4.6)
Potassium: 4.3 mmol/L (ref 3.5–5.1)
Sodium: 141 mmol/L (ref 135–145)

## 2022-06-09 LAB — CBC
HCT: 39.7 % (ref 36.0–46.0)
Hemoglobin: 13.4 g/dL (ref 12.0–15.0)
MCH: 32.2 pg (ref 26.0–34.0)
MCHC: 33.8 g/dL (ref 30.0–36.0)
MCV: 95.4 fL (ref 80.0–100.0)
Platelets: 304 10*3/uL (ref 150–400)
RBC: 4.16 MIL/uL (ref 3.87–5.11)
RDW: 13.3 % (ref 11.5–15.5)
WBC: 7.4 10*3/uL (ref 4.0–10.5)
nRBC: 0 % (ref 0.0–0.2)

## 2022-06-09 LAB — MAGNESIUM: Magnesium: 2.1 mg/dL (ref 1.7–2.4)

## 2022-06-09 MED ORDER — ACETAMINOPHEN 500 MG PO TABS: 1000.0000 mg | ORAL_TABLET | Freq: Three times a day (TID) | ORAL | 0 refills | Status: DC | PRN

## 2022-06-09 NOTE — Progress Notes (Signed)
Report given to Beau Fanny, RN of Memory Care at [facility].

## 2022-06-09 NOTE — Plan of Care (Signed)
  Problem: Education: Goal: Knowledge of General Education information will improve Description: Including pain rating scale, medication(s)/side effects and non-pharmacologic comfort measures Outcome: Progressing   Problem: Nutrition: Goal: Adequate nutrition will be maintained Outcome: Progressing   Problem: Coping: Goal: Level of anxiety will decrease Outcome: Progressing   Problem: Safety: Goal: Ability to remain free from injury will improve Outcome: Progressing   Problem: Nutrition: Goal: Risk of aspiration will decrease Outcome: Progressing   Problem: Ischemic Stroke/TIA Tissue Perfusion: Goal: Complications of ischemic stroke/TIA will be minimized Outcome: Progressing

## 2022-06-09 NOTE — TOC Transition Note (Signed)
Transition of Care [facility]) - CM/SW Discharge Note   Patient Details  Name: Jean [name] MRN: [id number] Date of Birth: 1941-08-24  Transition of Care [facility]) CM/SW Contact:  Amador Cunas, [place] Phone Number: 06/09/2022, 11:22 AM   Clinical Narrative:   Pt admitted from [facility] ([facility]). Spoke to [place] at [facility] who confirmed pt is able to return. DC summary faxed to facility [phone]. [facility] called for transport and RN has given report. Voicemail left for pt's son Lanny Hurst notifying him of dc. SW signing off at dc.   Wandra Feinstein, MSW, LCSW [phone] (coverage)      Final next level of care: Skilled Nursing Facility Barriers to Discharge: No Barriers Identified   Patient Goals and CMS Choice Patient states their goals for this hospitalization and ongoing recovery are:: return to [facility]      Discharge Placement              Patient chooses bed at: [facility] Patient to be transferred to facility by: [facility] Name of family member notified: Keith/son Patient and family notified of of transfer: 06/09/22  Discharge Plan and Services                DME Arranged: N/A         HH Arranged: Refused HH          Social Determinants of Health (SDOH) Interventions     Readmission Risk Interventions     No data to display

## 2022-06-09 NOTE — Evaluation (Signed)
Speech Language Pathology Evaluation Patient Details Name: Jean [name] MRN: [id number] DOB: Apr 25, 1941 Today's Date: 06/09/2022 Time: 1884-1660 SLP Time Calculation (min) (ACUTE ONLY): 12 min  Problem List:  Patient Active Problem List   Diagnosis Date Noted   At risk for polypharmacy 06/08/2022   Acute encephalopathy 06/07/2022   Dehydration 06/07/2022   CKD (chronic kidney disease) stage 3, GFR 30-59 ml/min (HCC) 04/27/2022   Cyst of soft tissue 11/06/2021   Confusion 09/19/2021   Dysuria 09/14/2021   Influenza 09/08/2021   Pain and swelling of left lower extremity 09/07/2021   Weight gain 08/14/2021   Dermatitis 08/14/2021   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[place]) 03/29/2021   Allergic rhinitis 03/14/2021   Weight loss 06/01/2020   Multiple rib fractures 01/06/2020   Fall 01/04/2020   Dizziness 11/16/2019   Back pain due to injury 11/16/2019   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 11/16/2019   Pneumonia 09/26/2016   UTI (urinary tract infection) 01/27/2015   Tinnitus of both ears 01/21/2015   Tear of meniscus of left knee 01/21/2015   Rectocele 06/28/2014   Pelvic floor dysfunction 06/28/2014   Hemorrhoids 02/11/2014   GERD (gastroesophageal reflux disease) 04/13/2013   Carotid artery disease ([place]) 12/31/2011   Constipation 02/23/2011   OPTIC NEURITIS 05/11/2010   Osteoporosis 05/05/2009   Diverticulosis of large intestine without diverticulitis 12/02/2008   Irritable bowel syndrome 12/02/2008   RECTAL POLYPS 12/02/2008   History of muscle pain 08/17/2008   PVD (peripheral vascular disease) ([place]) 08/17/2008   VITAMIN D DEFICIENCY 05/19/2008   Hyperlipidemia 05/19/2008   Rash 05/14/2008   Depression with anxiety 01/06/2008   Essential hypertension 11/20/2007   Seasonal and perennial allergic rhinitis 08/01/2007   Past Medical History:  Past Medical History:  Diagnosis Date   Allergy    Anemia    Anxiety    Asthma    CAD (coronary artery disease)     Depression    Diverticulosis    Dysfunction of eustachian tube    Elevated LFTs    Esophageal reflux    Esophageal stricture 2002   Hemorrhoids    History of rectal polyps    HLD (hyperlipidemia)    HTN (hypertension)    Hypertonicity of bladder    Irritable bowel syndrome with constipation    Obesity    Optic neuritis    Osteoporosis    Peptic ulcer disease    Peripheral neuropathy    Trigeminal neuralgia    prior injury   Visual loss, bilateral    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, 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   Vitamin D deficiency    Past Surgical History:  Past Surgical History:  Procedure Laterality Date   ABDOMINAL HYSTERECTOMY     ANAL RECTAL MANOMETRY N/A 07/05/2014   Procedure: ANO RECTAL MANOMETRY;  Surgeon: Leighton Ruff, MD;  Location: [facility];  Service: Endoscopy;  Laterality: N/A;   APPENDECTOMY     CAROTID ENDARTERECTOMY     COLONOSCOPY  2008, 2015   TONSILLECTOMY     UPPER GASTROINTESTINAL ENDOSCOPY  2002   HPI:  Jean [name] is a 81 y.o. female with medical history significant of hypertension, GERD who presents to the emergency department via EMS from a nursing facility due to change in patient's mental status.  Patient was usually able to ambulate and carry a conversation at baseline, but she was last well-known at 12 PM when having lunch, after which she was noted to be sleeping around 2  PM.  At, around 3:30 PM, patient was found facedown on the bed poorly responsive to verbal stimuli as well as physical stimuli (patient states that she heard all that was being said to her, but she had difficulty in being able to respond).  EMS was activated and patient was sent to the ED for further evaluation and management.  There was no report of fever, chills, chest pain, shortness of breath, nausea, vomiting.  There was concern for possible CVA, however, MRI of the brain was showing no acute findings.   Patient is from a skilled facility and reported she  has no responsibilities.   Assessment / Plan / Recommendation Clinical Impression  Cognitive/linguistic evaluation and motor speech screen was completed.  Cranial nerve exam was completed and unremarkable.  Lingual, labial, facial and jaw range of motion and strength appeared to be adequate.  Facial sensation appeared to be intact and she did not endorse a difference in sensation between the right to left side of her face.  Speech was clear and easy to understand. No discernible dysarthria or apraxia were noted.  She achieved an overall score of 15/25 on the Mini Mental State Exam.  Deficits were noted for orientation and delayed recall.  She was fully oriented to person, place and situation.  She was disoriented to time only stating the day of week correctly.  She stated the year as 2028 and had no response to the month or date.  Immediate recall of 3 novel words was good. However, given a short delay she was unable to recall any of the words.  Use of semantic cue did facilitate recall of the three novel words.  She had good attention to task.  Suspect language skills to be grossly intact.  However, most of the language questions were unable to be administered due to her vision loss.  She was able to follow 2 parts of a 3 step command.  She struggled to repeat a short phrase.  She was able to provide logical solutions to simple problems.  Given history of dementia and no acute findings on MRI of the head suspect issues to be baseline.  Suggest evaluation at the next level of care if issues are noted.  ST will not follow during acute stay.  Procedure:  The Mini Mental State Exam was administered to assess the patient's cognitive/linguistic skills.  Results were as follows:  ORIENTATION:  6/10 REGISTRATION:  3/3 ATTENTION:  5/5 RECALL:   0/3 LANGUAGE:  2/4 ( usually scored out of 9)  TOTAL SCORE:  15/25 (usually scored out of 30) Norms:  24/30 and above considered functional     SLP Assessment  SLP  Recommendation/Assessment: All further Speech Lanaguage Pathology  needs can be addressed in the next venue of care SLP Visit Diagnosis: Cognitive communication deficit (R41.841)    Recommendations for follow up therapy are one component of a multi-disciplinary discharge planning process, led by the attending physician.  Recommendations may be updated based on patient status, additional functional criteria and insurance authorization.    Follow Up Recommendations       Assistance Recommended at Discharge     Functional Status Assessment    Frequency and Duration           SLP Evaluation Cognition  Overall Cognitive Status: No family/caregiver present to determine baseline cognitive functioning Arousal/Alertness: Awake/alert Orientation Level: Oriented to person;Oriented to place;Oriented to situation;Disoriented to time Attention: Focused Focused Attention: Appears intact Memory: Impaired Memory Impairment: Decreased recall  of new information Problem Solving: Appears intact       Comprehension  Auditory Comprehension Overall Auditory Comprehension: Appears within functional limits for tasks assessed Yes/No Questions: Not tested Commands: Within Functional Limits Conversation: Simple Visual Recognition/Discrimination Discrimination: Not tested Reading Comprehension Reading Status: Unable to assess (comment) (patient is blinde)    Expression Expression Primary Mode of Expression: Verbal Verbal Expression Overall Verbal Expression: Appears within functional limits for tasks assessed Initiation: No impairment Automatic Speech: Name;Social Response Level of Generative/Spontaneous Verbalization: Phrase;Sentence Repetition: No impairment Naming: No impairment Pragmatics: No impairment Non-Verbal Means of Communication: Not applicable Written Expression Dominant Hand: Right   Oral / Motor  Oral Motor/Sensory Function Overall Oral Motor/Sensory Function: Within functional  limits Motor Speech Overall Motor Speech: Appears within functional limits for tasks assessed Respiration: Within functional limits Phonation: Normal Resonance: Within functional limits Articulation: Within functional limitis Intelligibility: Intelligible Motor Planning: Witnin functional limits Motor Speech Errors: Not applicable            Shelly Flatten, MA, CCC-SLP Acute Rehab SLP [phone]  Lamar Sprinkles 06/09/2022, 1:01 PM

## 2022-06-09 NOTE — Discharge Summary (Signed)
Physician Discharge Summary  Jean [name] SJG:[id number] DOB: 1940-11-04 DOA: 06/07/2022  PCP: [name], MD  Admit date: 06/07/2022 Discharge date: 06/09/2022 Admitted From: SNF/memory care Disposition: SNF/memory care Recommendations for Outpatient Follow-up:  May consider weaning off Ativan or changing it to Klonopin to minimize withdrawal Check BMP and CBC in 1 to 2 weeks Please follow up on the following pending results: None   Discharge Condition: Stable CODE STATUS: DNR/DNI   Hospital course 81 year old F with PMH of carotid artery disease, HTN, HLD, GERD, blindness, trigeminal neuralgia, neuropathy and anxiety brought to ED by EMS from nursing facility due to altered mental status.  Reportedly found facedown on the bed and was poorly responsive to verbal stimuli and physical stimuli about 3:30 PM, and EMS was called.   In ED, slightly hypertensive.  Other vitals within normal.  Labs including BMP, CBC, UA, influenza A, B and COVID-19 without significant finding.  CT head without acute finding.  UDS positive for benzo but patient is on Ativan.  Neurology consulted.  MRI brain, MRA head and neck and EEG ordered.  Patient was admitted for CVA work-up and rule out seizure.    MRI brain, MRA head and neck without acute finding.  EEG with excessive beta activity seen in background most likely due to effect of benzodiazepines but no seizure or epileptiform discharge.   On the day of discharge, encephalopathy resolved.  She is oriented x4.  Neuro exam without significant finding.  She was cleared for discharge by neurology.  Norco, Librium and amitriptyline discontinued on discharge.  She is on Ativan 0.5 mg twice daily.  May consider weaning off this or changing to Klonopin to minimize withdrawal.   Of note, patient is also on diuretics.  She is high risk for dehydration.  Reevaluate need.   See individual problem list below for more.   Problems addressed during this  hospitalization Principal Problem:   Acute encephalopathy Active Problems:   Depression with anxiety   Essential hypertension   GERD (gastroesophageal reflux disease)   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HCC)   Dehydration   At risk for polypharmacy   Acute encephalopathy in patient with dementia: Likely toxic and iatrogenic Patient is on multiple sedating medication including Elavil, Librium, Prolixin, Norco and Ativan.  Work-up including MRI brain, MRA head and neck, EEG and basic labs unrevealing.  Encephalopathy resolved after holding sedating medications except low-dose Ativan.  She has no focal neurologic deficit.  She is oriented x4.  The -May consider weaning of Ativan if possible.  If not, consider Klonopin to minimize withdrawal.  -Continue memantine.   Dehydration: Resolved.  Reassess need for diuretics given high risk for dehydration.   Essential hypertension: Normotensive.  Not on antihypertensive meds except Lasix and Aldactone.   GERD -Continue famotidine   Anxiety and depression: Stable -Continue lorazepam due to risk of withdrawal but consider weaning off or changing to Klonopin -Discontinued Elavil, Librium and Norco. -Tylenol 1 g 3 times daily as needed for pain   Hyperlipidemia: LDL 113.  Given age, I doubt benefit from a statin for primary prevention   At risk for polypharmacy: On Elavil, Librium, Prolixin, Norco and Ativan -Discontinued Elavil, Librium and Norco on discharge -Continue lorazepam due to risk of withdrawal but consider weaning off or changing to Klonopin   DO NOT RESUSCITATE: Present on admission.           Vital signs Vitals:   06/09/22 0000 06/09/22 0121 06/09/22 0358 06/09/22 0741  BP: Marland Kitchen)  171/68 (!) 155/75 (!) 148/69 (!) 153/53  Pulse: 71  78 71  Temp: 97.7 F (36.5 C)  98.8 F (37.1 C) 98.2 F (36.8 C)  Resp: '18  18 19  '$ Weight:      SpO2: 100%  96% 100%  TempSrc: Oral   Oral  BMI (Calculated):         Discharge exam  GENERAL: No  apparent distress.  Nontoxic. HEENT: MMM.  Hearing grossly intact.  Legally blind. NECK: Supple.  No apparent JVD.  RESP:  No IWOB.  Fair aeration bilaterally. CVS:  RRR. Heart sounds normal.  ABD/GI/GU: BS+. Abd soft, NTND.  MSK/EXT:  Moves extremities. No apparent deformity. No edema.  SKIN: no apparent skin lesion or wound NEURO: Awake and alert. Oriented x4.  No apparent focal neuro deficit. PSYCH: Calm. Normal affect.   Discharge Instructions Discharge Instructions     Diet general   Complete by: As directed    Increase activity slowly   Complete by: As directed       Allergies as of 06/09/2022       Reactions   Biaxin [clarithromycin]    Clarithromycin    Doxycycline    Fosamax [alendronate Sodium]    Geodon [ziprasidone Hydrochloride]    Lipitor [atorvastatin Calcium]    Penicillins    Pravachol [pravastatin Sodium]    Statins    Sulfonamide Derivatives    Zithromax [azithromycin]    Zocor [simvastatin]         Medication List     STOP taking these medications    amitriptyline 50 MG tablet Commonly known as: ELAVIL   chlordiazePOXIDE 25 MG capsule Commonly known as: LIBRIUM   HYDROcodone-acetaminophen 5-325 MG tablet Commonly known as: Norco       TAKE these medications    acetaminophen 500 MG tablet Commonly known as: TYLENOL Take 2 tablets (1,000 mg total) by mouth every 8 (eight) hours as needed for up to 10 days for mild pain, moderate pain, headache or fever. What changed:  medication strength how much to take reasons to take this additional instructions Another medication with the same name was removed. Continue taking this medication, and follow the directions you see here.   alum & mag hydroxide-simeth 200-200-20 MG/5ML suspension Commonly known as: MAALOX/MYLANTA Take 30 mLs by mouth at bedtime.   alum & mag hydroxide-simeth 200-200-20 MG/5ML suspension Commonly known as: MAALOX/MYLANTA Take 30 mLs by mouth every 4 (four) hours  as needed for indigestion or heartburn.   ARTIFICIAL TEAR OP Apply 1 drop to eye 3 (three) times daily.   aspirin 81 MG chewable tablet Chew 81 mg by mouth daily.   bisacodyl 10 MG suppository Commonly known as: DULCOLAX Place 10 mg rectally daily as needed for moderate constipation.   bisacodyl 5 MG EC tablet Commonly known as: DULCOLAX Take 5 mg by mouth daily as needed for moderate constipation.   carbamide peroxide 6.5 % OTIC solution Commonly known as: DEBROX 5 drops as needed (impaction). 5 drops to affected ear, otic (ear). At bedtime, PRN, Identify cerumen impaction by direct visualization by use of otoscope. Place 5 drops to affected ear QHS x3 nights then irrigate with bulb syringe. If not effective notify MD.   diclofenac Sodium 1 % Gel Commonly known as: VOLTAREN Apply 2 g topically 2 (two) times daily as needed (pain). apply to lower back area   famotidine 20 MG tablet Commonly known as: PEPCID Take 20 mg by mouth at bedtime.   fluPHENAZine  1 MG tablet Commonly known as: PROLIXIN Take 1 mg by mouth at bedtime.   furosemide 20 MG tablet Commonly known as: LASIX Take 20 mg by mouth daily.   guaiFENesin 600 MG 12 hr tablet Commonly known as: MUCINEX Take 600 mg by mouth daily as needed (Cold or cogestion).   hydrocortisone valerate cream 0.2 % Commonly known as: WESTCORT Apply 1 application  topically 2 (two) times daily.   ketoconazole 2 % shampoo Commonly known as: NIZORAL Apply 1 application. topically 2 (two) times a week. Tuesday and Friday   ketoconazole 2 % cream Commonly known as: NIZORAL Apply 1 application  topically 2 (two) times daily.   loratadine 10 MG tablet Commonly known as: CLARITIN Take 10 mg by mouth daily.   LORazepam 0.5 MG tablet Commonly known as: ATIVAN Take 1 tablet (0.5 mg total) by mouth 2 (two) times daily.   magnesium hydroxide 400 MG/5ML suspension Commonly known as: MILK OF MAGNESIA Take 30 mLs by mouth daily as  needed for mild constipation.   memantine 10 MG tablet Commonly known as: NAMENDA Take 10 mg by mouth 2 (two) times daily.   MIRALAX PO Take 17 g by mouth See admin instructions. Take 17 gm, oral twice a day, every other day. Miralax 17 gm + 4-8oz fluid mizture BID   ORAJEL MT Use as directed in the mouth or throat. 20-0.26-0.15 %; amt: SMALL AMOUNT Three Times A Day - PRN;  mucous membrane Special Instructions: Apply to right inner mouth sore TID PRN [DX: Other specified disorders of teeth and supporting structures]  Every 4 Hours - PRN;may self-administer   OYSTER SHELL PO Take 250 mg by mouth in the morning and at bedtime.   saccharomyces boulardii 250 MG capsule Commonly known as: FLORASTOR Take 250 mg by mouth daily.   Sentry Senior Tabs Take 1 tablet by mouth daily. 500-300-250 mcg [DX: Vitamin deficiency, unspecified]   spironolactone 25 MG tablet Commonly known as: ALDACTONE Take 12.5 mg by mouth daily.   triamcinolone cream 0.1 % Commonly known as: KENALOG Apply 1 application  topically as needed (rash). Apply BID as needed to rash to chest, behind L ear, left posterior neck.        Consultations: Neurology  Procedures/Studies:   ECHOCARDIOGRAM COMPLETE  Result Date: 06/08/2022    ECHOCARDIOGRAM REPORT   Patient Name:   Jean [name] Date of Exam: 06/08/2022 Medical Rec #:  [id number]      Height:       65.0 in Accession #:    [id number]     Weight:       151.7 lb Date of Birth:  [birth year]     BSA:          1.759 m Patient Age:    13 years       BP:           161/48 mmHg Patient Gender: F              HR:           86 bpm. Exam Location:  Inpatient Procedure: 2D Echo, Cardiac Doppler and Color Doppler Indications:    TIA  History:        Patient has no prior history of Echocardiogram examinations.                 Risk Factors:Dyslipidemia and Hypertension. PVD. CKD.  Sonographer:    Clayton Lefort RDCS (AE) Referring Phys: Derek Jack  Sonographer Comments:  Technically difficult study due to poor echo windows. IMPRESSIONS  1. Left ventricular ejection fraction, by estimation, is 55 to 60%. The left ventricle has normal function. Left ventricular endocardial border not optimally defined to evaluate regional wall motion. Left ventricular diastolic parameters are indeterminate.  2. Right ventricular systolic function is normal. The right ventricular size is normal.  3. The mitral valve was not well visualized. No evidence of mitral valve regurgitation. No evidence of mitral stenosis.  4. The aortic valve was not well visualized. Aortic valve regurgitation is not visualized. No aortic stenosis is present. Comparison(s): No prior Echocardiogram. Conclusion(s)/Recommendation(s): Very limited imaged due to poor echo windows. Grossly normal RV and LV function. No severe valve disease by Doppler, but valves not well visualized. Consider TEE if indicated to exclude cardioembolic source. FINDINGS  Left Ventricle: Left ventricular ejection fraction, by estimation, is 55 to 60%. The left ventricle has normal function. Left ventricular endocardial border not optimally defined to evaluate regional wall motion. The left ventricular internal cavity size was normal in size. There is borderline left ventricular hypertrophy. Left ventricular diastolic parameters are indeterminate. Right Ventricle: The right ventricular size is normal. Right vetricular wall thickness was not well visualized. Right ventricular systolic function is normal. Left Atrium: Left atrial size was normal in size. Right Atrium: Right atrial size was normal in size. Pericardium: There is no evidence of pericardial effusion. Presence of epicardial fat layer. Mitral Valve: The mitral valve was not well visualized. No evidence of mitral valve regurgitation. No evidence of mitral valve stenosis. Tricuspid Valve: The tricuspid valve is not well visualized. Tricuspid valve regurgitation is trivial. No evidence of tricuspid  stenosis. Aortic Valve: The aortic valve was not well visualized. Aortic valve regurgitation is not visualized. No aortic stenosis is present. Aortic valve mean gradient measures 3.0 mmHg. Aortic valve peak gradient measures 3.9 mmHg. Aortic valve area, by VTI measures 3.60 cm. Pulmonic Valve: The pulmonic valve was not well visualized. Pulmonic valve regurgitation is not visualized. Aorta: The aortic root and ascending aorta are structurally normal, with no evidence of dilitation. Venous: The inferior vena cava was not well visualized. IAS/Shunts: The interatrial septum was not well visualized.  LEFT VENTRICLE PLAX 2D LVIDd:         4.40 cm   Diastology LV PW:         1.30 cm   LV e' medial:    3.48 cm/s LV IVS:        1.10 cm   LV E/e' medial:  16.3 LVOT diam:     2.30 cm   LV e' lateral:   7.72 cm/s LV SV:         74        LV E/e' lateral: 7.3 LV SV Index:   42 LVOT Area:     4.15 cm  RIGHT VENTRICLE RV Basal diam:  2.70 cm RV S prime:     12.80 cm/s TAPSE (M-mode): 1.4 cm LEFT ATRIUM             Index        RIGHT ATRIUM           Index LA diam:        4.20 cm 2.39 cm/m   RA Area:     12.60 cm LA Vol (A2C):   19.7 ml 11.20 ml/m  RA Volume:   29.70 ml  16.89 ml/m LA Vol (A4C):   38.9 ml 22.12 ml/m LA Biplane Vol: 30.7 ml 17.46  ml/m  AORTIC VALVE AV Area (Vmax):    3.35 cm AV Area (Vmean):   3.48 cm AV Area (VTI):     3.60 cm AV Vmax:           99.10 cm/s AV Vmean:          74.900 cm/s AV VTI:            0.204 m AV Peak Grad:      3.9 mmHg AV Mean Grad:      3.0 mmHg LVOT Vmax:         80.00 cm/s LVOT Vmean:        62.800 cm/s LVOT VTI:          0.177 m LVOT/AV VTI ratio: 0.87  AORTA Ao Root diam: 2.70 cm Ao Asc diam:  2.90 cm MITRAL VALVE MV Area (PHT): 3.19 cm    SHUNTS MV Decel Time: 238 msec    Systemic VTI:  0.18 m MV E velocity: 56.70 cm/s  Systemic Diam: 2.30 cm MV A velocity: 96.20 cm/s MV E/A ratio:  0.59 Buford Dresser MD Electronically signed by Buford Dresser MD Signature  Date/Time: 06/08/2022/6:43:11 PM    Final    EEG adult  Result Date: 06/08/2022 Lora Havens, MD     06/08/2022  5:14 AM Patient Name: Jean [name] MRN: [id number] Epilepsy Attending: Lora Havens Referring Physician/Provider: Derek Jack, MD Date: 06/08/2022 Duration: 21.48 mins Patient history: 81yo F with ams. EEG to evaluate for seizure. Level of alertness: Awake AEDs during EEG study: Ativan, Librium Technical aspects: This EEG study was done with scalp electrodes positioned according to the 10-20 International system of electrode placement. Electrical activity was reviewed with band pass filter of 1-'70Hz'$ , sensitivity of 7 uV/mm, display speed of 16m/sec with a '60Hz'$  notched filter applied as appropriate. EEG data were recorded continuously and digitally stored.  Video monitoring was available and reviewed as appropriate. Description: No posterior dominant rhythm was seen. There was an excessive amount of 15 to 18 Hz beta activity distributed symmetrically and diffusely.  Hyperventilation and photic stimulation were not performed.   ABNORMALITY - Excessive beta, generalized IMPRESSION: This study is within normal limits. The excessive beta activity seen in the background is most likely due to the effect of benzodiazepine and is a benign EEG pattern. No seizures or epileptiform discharges were seen throughout the recording. A normal interictal EEG does not exclude nor support the diagnosis of epilepsy. PLora Havens  MR BRAIN W WO CONTRAST  Result Date: 06/08/2022 CLINICAL DATA:  Transient ischemic attack.  New onset seizure. EXAM: MRI HEAD WITHOUT AND WITH CONTRAST MRA HEAD WITHOUT CONTRAST MRA NECK WITHOUT AND WITH CONTRAST TECHNIQUE: Multiplanar, multiecho pulse sequences of the brain and surrounding structures were obtained without and with intravenous contrast. Angiographic images of the Circle of Willis were obtained using MRA technique without intravenous contrast. Angiographic  images of the neck were obtained using MRA technique without and with intravenous contrast. Carotid stenosis measurements (when applicable) are obtained utilizing NASCET criteria, using the distal internal carotid diameter as the denominator. CONTRAST:  6.5 mL gadopiclenol (Vueway) 0.5 mmol/ml solution COMPARISON:  None Available. FINDINGS: MRI HEAD FINDINGS Brain: No acute infarct, mass effect or extra-axial collection. No acute or chronic hemorrhage. There is multifocal hyperintense T2-weighted signal within the white matter. Generalized volume loss. The midline structures are normal. There is no abnormal contrast enhancement. Vascular: Major flow voids are preserved. Skull and upper cervical spine: Normal  calvarium and skull base. Visualized upper cervical spine and soft tissues are normal. Sinuses/Orbits:No paranasal sinus fluid levels or advanced mucosal thickening. No mastoid or middle ear effusion. Normal orbits. MRA HEAD FINDINGS POSTERIOR CIRCULATION: --Vertebral arteries: Normal --Inferior cerebellar arteries: Normal. --Basilar artery: Normal. --Superior cerebellar arteries: Normal. --Posterior cerebral arteries: Normal. ANTERIOR CIRCULATION: --Intracranial internal carotid arteries: Normal. --Anterior cerebral arteries (ACA): Normal. --Middle cerebral arteries (MCA): At the right MCA bifurcation, there is a laterally projecting outpouching that measures 2 mm. This may be a small aneurysm or a chronically occluded branch vessel. Otherwise, the middle cerebral arteries are normal. ANATOMIC VARIANTS: None MRA NECK FINDINGS Left dominant vertebral arteries are patent and of normal caliber. Both carotid systems are normal without stenosis. IMPRESSION: 1. No acute intracranial abnormality. 2. 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 3. No emergent large vessel occlusion or high-grade stenosis. 4. A 2 mm laterally projecting outpouching at the right MCA bifurcation may be a small aneurysm or an  occluded branch vessel. Given the lack of acute ischemia, this is unlikely to be an acute occlusion. Electronically Signed   By: Ulyses Jarred M.D.   On: 06/08/2022 01:42   MR ANGIO HEAD WO CONTRAST  Result Date: 06/08/2022 CLINICAL DATA:  Transient ischemic attack.  New onset seizure. EXAM: MRI HEAD WITHOUT AND WITH CONTRAST MRA HEAD WITHOUT CONTRAST MRA NECK WITHOUT AND WITH CONTRAST TECHNIQUE: Multiplanar, multiecho pulse sequences of the brain and surrounding structures were obtained without and with intravenous contrast. Angiographic images of the Circle of Willis were obtained using MRA technique without intravenous contrast. Angiographic images of the neck were obtained using MRA technique without and with intravenous contrast. Carotid stenosis measurements (when applicable) are obtained utilizing NASCET criteria, using the distal internal carotid diameter as the denominator. CONTRAST:  6.5 mL gadopiclenol (Vueway) 0.5 mmol/ml solution COMPARISON:  None Available. FINDINGS: MRI HEAD FINDINGS Brain: No acute infarct, mass effect or extra-axial collection. No acute or chronic hemorrhage. There is multifocal hyperintense T2-weighted signal within the white matter. Generalized volume loss. The midline structures are normal. There is no abnormal contrast enhancement. Vascular: Major flow voids are preserved. Skull and upper cervical spine: Normal calvarium and skull base. Visualized upper cervical spine and soft tissues are normal. Sinuses/Orbits:No paranasal sinus fluid levels or advanced mucosal thickening. No mastoid or middle ear effusion. Normal orbits. MRA HEAD FINDINGS POSTERIOR CIRCULATION: --Vertebral arteries: Normal --Inferior cerebellar arteries: Normal. --Basilar artery: Normal. --Superior cerebellar arteries: Normal. --Posterior cerebral arteries: Normal. ANTERIOR CIRCULATION: --Intracranial internal carotid arteries: Normal. --Anterior cerebral arteries (ACA): Normal. --Middle cerebral arteries  (MCA): At the right MCA bifurcation, there is a laterally projecting outpouching that measures 2 mm. This may be a small aneurysm or a chronically occluded branch vessel. Otherwise, the middle cerebral arteries are normal. ANATOMIC VARIANTS: None MRA NECK FINDINGS Left dominant vertebral arteries are patent and of normal caliber. Both carotid systems are normal without stenosis. IMPRESSION: 1. No acute intracranial abnormality. 2. 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 3. No emergent large vessel occlusion or high-grade stenosis. 4. A 2 mm laterally projecting outpouching at the right MCA bifurcation may be a small aneurysm or an occluded branch vessel. Given the lack of acute ischemia, this is unlikely to be an acute occlusion. Electronically Signed   By: Ulyses Jarred M.D.   On: 06/08/2022 01:42   MR ANGIO NECK W WO CONTRAST  Result Date: 06/08/2022 CLINICAL DATA:  Transient ischemic attack.  New onset seizure. EXAM: MRI HEAD WITHOUT  AND WITH CONTRAST MRA HEAD WITHOUT CONTRAST MRA NECK WITHOUT AND WITH CONTRAST TECHNIQUE: Multiplanar, multiecho pulse sequences of the brain and surrounding structures were obtained without and with intravenous contrast. Angiographic images of the Circle of Willis were obtained using MRA technique without intravenous contrast. Angiographic images of the neck were obtained using MRA technique without and with intravenous contrast. Carotid stenosis measurements (when applicable) are obtained utilizing NASCET criteria, using the distal internal carotid diameter as the denominator. CONTRAST:  6.5 mL gadopiclenol (Vueway) 0.5 mmol/ml solution COMPARISON:  None Available. FINDINGS: MRI HEAD FINDINGS Brain: No acute infarct, mass effect or extra-axial collection. No acute or chronic hemorrhage. There is multifocal hyperintense T2-weighted signal within the white matter. Generalized volume loss. The midline structures are normal. There is no abnormal contrast enhancement.  Vascular: Major flow voids are preserved. Skull and upper cervical spine: Normal calvarium and skull base. Visualized upper cervical spine and soft tissues are normal. Sinuses/Orbits:No paranasal sinus fluid levels or advanced mucosal thickening. No mastoid or middle ear effusion. Normal orbits. MRA HEAD FINDINGS POSTERIOR CIRCULATION: --Vertebral arteries: Normal --Inferior cerebellar arteries: Normal. --Basilar artery: Normal. --Superior cerebellar arteries: Normal. --Posterior cerebral arteries: Normal. ANTERIOR CIRCULATION: --Intracranial internal carotid arteries: Normal. --Anterior cerebral arteries (ACA): Normal. --Middle cerebral arteries (MCA): At the right MCA bifurcation, there is a laterally projecting outpouching that measures 2 mm. This may be a small aneurysm or a chronically occluded branch vessel. Otherwise, the middle cerebral arteries are normal. ANATOMIC VARIANTS: None MRA NECK FINDINGS Left dominant vertebral arteries are patent and of normal caliber. Both carotid systems are normal without stenosis. IMPRESSION: 1. No acute intracranial abnormality. 2. 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 3. No emergent large vessel occlusion or high-grade stenosis. 4. A 2 mm laterally projecting outpouching at the right MCA bifurcation may be a small aneurysm or an occluded branch vessel. Given the lack of acute ischemia, this is unlikely to be an acute occlusion. Electronically Signed   By: Ulyses Jarred M.D.   On: 06/08/2022 01:42   CT HEAD CODE STROKE WO CONTRAST  Result Date: 06/07/2022 CLINICAL DATA:  Code stroke.  Neuro deficit, acute, stroke suspected EXAM: CT HEAD WITHOUT CONTRAST TECHNIQUE: Contiguous axial images were obtained from the base of the skull through the vertex without intravenous contrast. RADIATION DOSE REDUCTION: This exam was performed according to the departmental dose-optimization program which includes automated exposure control, adjustment of the mA and/or kV  according to patient size and/or use of iterative reconstruction technique. COMPARISON:  CT head March 10, 2021. FINDINGS: Brain: No evidence of acute large vascular territory infarction, hemorrhage, hydrocephalus, extra-axial collection or mass lesion/mass effect. Mild for age patchy white matter hypoattenuation, nonspecific but compatible with chronic microvascular ischemic disease. Vascular: No hyperdense vessel identified. Calcific intracranial atherosclerosis. Skull: No acute fracture. Sinuses/Orbits: No acute findings. ASPECTS [facility] Stroke Program Early CT Score) total score (0-10 with 10 being normal): 10. IMPRESSION: 1. No evidence of acute intracranial abnormality. 2. ASPECTS is 10. Code stroke imaging results were communicated on 06/07/2022 at 4:52 pm to provider Dr. Quinn Axe Via secure text paging. Electronically Signed   By: Margaretha Sheffield M.D.   On: 06/07/2022 16:52       The results of significant diagnostics from this hospitalization (including imaging, microbiology, ancillary and laboratory) are listed below for reference.     Microbiology: Recent Results (from the past 240 hour(s))  Resp Panel by RT-PCR (Flu A&B, Covid) Anterior Nasal Swab     Status: None  Collection Time: 06/07/22  4:41 PM   Specimen: Anterior Nasal Swab  Result Value Ref Range Status   SARS Coronavirus 2 by RT PCR NEGATIVE NEGATIVE Final    Comment: (NOTE) SARS-CoV-2 target nucleic acids are NOT DETECTED.  The SARS-CoV-2 RNA is generally detectable in upper respiratory specimens during the acute phase of infection. The lowest concentration of SARS-CoV-2 viral copies this assay can detect is 138 copies/mL. A negative result does not preclude SARS-Cov-2 infection and should not be used as the sole basis for treatment or other patient management decisions. A negative result may occur with  improper specimen collection/handling, submission of specimen other than nasopharyngeal swab, presence of viral  mutation(s) within the areas targeted by this assay, and inadequate number of viral copies(<138 copies/mL). A negative result must be combined with clinical observations, patient history, and epidemiological information. The expected result is Negative.  Fact Sheet for Patients:  EntrepreneurPulse.com.au  Fact Sheet for Healthcare Providers:  IncredibleEmployment.be  This test is no t yet approved or cleared by the [place] FDA and  has been authorized for detection and/or diagnosis of SARS-CoV-2 by FDA under an Emergency Use Authorization (EUA). This EUA will remain  in effect (meaning this test can be used) for the duration of the COVID-19 declaration under Section 564(b)(1) of the Act, 21 U.S.C.section 360bbb-3(b)(1), unless the authorization is terminated  or revoked sooner.       Influenza A by PCR NEGATIVE NEGATIVE Final   Influenza B by PCR NEGATIVE NEGATIVE Final    Comment: (NOTE) The Xpert Xpress SARS-CoV-2/FLU/RSV plus assay is intended as an aid in the diagnosis of influenza from Nasopharyngeal swab specimens and should not be used as a sole basis for treatment. Nasal washings and aspirates are unacceptable for Xpert Xpress SARS-CoV-2/FLU/RSV testing.  Fact Sheet for Patients: EntrepreneurPulse.com.au  Fact Sheet for Healthcare Providers: IncredibleEmployment.be  This test is not yet approved or cleared by the [place] FDA and has been authorized for detection and/or diagnosis of SARS-CoV-2 by FDA under an Emergency Use Authorization (EUA). This EUA will remain in effect (meaning this test can be used) for the duration of the COVID-19 declaration under Section 564(b)(1) of the Act, 21 U.S.C. section 360bbb-3(b)(1), unless the authorization is terminated or revoked.  Performed at [facility], [place] [address], [place], [place] [address]      Labs:  CBC: Recent Labs   Lab 06/07/22 1641 06/07/22 1646 06/08/22 0032 06/09/22 0504  WBC 6.4  --  9.2 7.4  NEUTROABS 3.1  --   --   --   HGB 15.1* 16.3* 12.8 13.4  HCT 46.3* 48.0* 39.3 39.7  MCV 96.9  --  95.6 95.4  PLT 303  --  333 304   BMP &GFR Recent Labs  Lab 06/07/22 1641 06/07/22 1646 06/08/22 0032 06/09/22 0504  NA 139 138 139 141  K 4.7 5.0 4.8 4.3  CL 101 103 104 107  CO2 25  --  25 22  GLUCOSE 98 97 102* 99  BUN 19 27* 21 14  CREATININE 1.16* 1.00 0.92 0.95  CALCIUM 9.5  --  9.1 9.4  MG  --   --  2.3 2.1  PHOS  --   --  3.8 3.3   Estimated Creatinine Clearance: 44.5 mL/min (by C-G formula based on SCr of 0.95 mg/dL). Liver & Pancreas: Recent Labs  Lab 06/07/22 1641 06/08/22 0032 06/09/22 0504  AST 29 34  --   ALT 18 19  --  ALKPHOS 100 98  --   BILITOT 0.3 0.6  --   PROT 7.2 6.7  --   ALBUMIN 4.0 3.8 3.5   No results for input(s): "LIPASE", "AMYLASE" in the last 168 hours. No results for input(s): "AMMONIA" in the last 168 hours. Diabetic: Recent Labs    06/08/22 0032  HGBA1C 5.6   Recent Labs  Lab 06/07/22 1637  GLUCAP 96   Cardiac Enzymes: No results for input(s): "CKTOTAL", "CKMB", "CKMBINDEX", "TROPONINI" in the last 168 hours. No results for input(s): "PROBNP" in the last 8760 hours. Coagulation Profile: Recent Labs  Lab 06/07/22 1641  INR 0.9   Thyroid Function Tests: No results for input(s): "TSH", "T4TOTAL", "FREET4", "T3FREE", "THYROIDAB" in the last 72 hours. Lipid Profile: Recent Labs    06/08/22 0032  CHOL 201*  HDL 71  LDLCALC 113*  TRIG 84  CHOLHDL 2.8   Anemia Panel: No results for input(s): "VITAMINB12", "FOLATE", "FERRITIN", "TIBC", "IRON", "RETICCTPCT" in the last 72 hours. Urine analysis:    Component Value Date/Time   COLORURINE STRAW (A) 06/07/2022 1641   APPEARANCEUR CLEAR 06/07/2022 1641   LABSPEC 1.005 06/07/2022 1641   PHURINE 8.0 06/07/2022 1641   GLUCOSEU NEGATIVE 06/07/2022 1641   GLUCOSEU NEGATIVE 08/06/2014  1122   GLUCOSEU NEGATIVE 08/06/2014 1122   HGBUR NEGATIVE 06/07/2022 1641   BILIRUBINUR NEGATIVE 06/07/2022 1641   KETONESUR NEGATIVE 06/07/2022 1641   PROTEINUR NEGATIVE 06/07/2022 1641   UROBILINOGEN 0.2 08/06/2014 1122   UROBILINOGEN 0.2 08/06/2014 1122   NITRITE NEGATIVE 06/07/2022 1641   LEUKOCYTESUR NEGATIVE 06/07/2022 1641   Sepsis Labs: Invalid input(s): "PROCALCITONIN", "LACTICIDVEN"   SIGNED:  Mercy Riding, MD  [facility] 06/09/2022, 10:51 AM

## 2022-06-09 NOTE — TOC Initial Note (Signed)
Transition of Care [facility]) - Initial/Assessment Note    Patient Details  Name: Jean [name] MRN: [id number] Date of Birth: Aug 30, 1941  [facility] CM/SW Contact:    Carles Collet, RN Phone Number: 06/09/2022, 9:41 AM  Clinical Narrative:                 Spoke with patient at bedside. Patient is blind. She states she is from [place]. Nothing came up on Google search, but address on file shows [facility]. Patient declined DME recs from PT, and also declined [place] services. Requested LCSW assistance with facility DC ILF v ALF and transportation.  Expected Discharge Plan: Assisted Living Barriers to Discharge: No Barriers Identified   Patient Goals and CMS Choice Patient states their goals for this hospitalization and ongoing recovery are:: return to [facility]      Expected Discharge Plan and Services Expected Discharge Plan: Assisted Living         Expected Discharge Date: 06/09/22               DME Arranged: N/A         HH Arranged: Refused HH          Prior Living Arrangements/Services                       Activities of Daily Living      Permission Sought/Granted                  Emotional Assessment              Admission diagnosis:  Seizures ([place]) [R56.9] Pulmonary embolism ([place]) [I26.99] Altered mental status, unspecified altered mental status type [R41.82] Patient Active Problem List   Diagnosis Date Noted   At risk for polypharmacy 06/08/2022   Acute encephalopathy 06/07/2022   Dehydration 06/07/2022   CKD (chronic kidney disease) stage 3, GFR 30-59 ml/min ([place]) 04/27/2022   Cyst of soft tissue 11/06/2021   Confusion 09/19/2021   Dysuria 09/14/2021   Influenza 09/08/2021   Pain and swelling of left lower extremity 09/07/2021   Weight gain 08/14/2021   Dermatitis 08/14/2021   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[place]) 03/29/2021   Allergic rhinitis 03/14/2021   Weight loss 06/01/2020   Multiple rib fractures  01/06/2020   Fall 01/04/2020   Dizziness 11/16/2019   Back pain due to injury 11/16/2019   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 11/16/2019   Pneumonia 09/26/2016   UTI (urinary tract infection) 01/27/2015   Tinnitus of both ears 01/21/2015   Tear of meniscus of left knee 01/21/2015   Rectocele 06/28/2014   Pelvic floor dysfunction 06/28/2014   Hemorrhoids 02/11/2014   GERD (gastroesophageal reflux disease) 04/13/2013   Carotid artery disease ([place]) 12/31/2011   Constipation 02/23/2011   OPTIC NEURITIS 05/11/2010   Osteoporosis 05/05/2009   Diverticulosis of large intestine without diverticulitis 12/02/2008   Irritable bowel syndrome 12/02/2008   RECTAL POLYPS 12/02/2008   History of muscle pain 08/17/2008   PVD (peripheral vascular disease) ([place]) 08/17/2008   VITAMIN D DEFICIENCY 05/19/2008   Hyperlipidemia 05/19/2008   Rash 05/14/2008   Depression with anxiety 01/06/2008   Essential hypertension 11/20/2007   Seasonal and perennial allergic rhinitis 08/01/2007   PCP:  [name], MD Pharmacy:   [place], [place] - [place] [address] [place] [address] Phone: [phone] Fax: [phone]     Social Determinants of  Health (SDOH) Interventions    Readmission Risk Interventions     No data to display

## 2022-06-11 ENCOUNTER — Encounter: Payer: Self-pay | Admitting: Nurse Practitioner

## 2022-06-11 ENCOUNTER — Non-Acute Institutional Stay (SKILLED_NURSING_FACILITY): Payer: Medicare Other | Admitting: Nurse Practitioner

## 2022-06-11 DIAGNOSIS — M159 Polyosteoarthritis, unspecified: Secondary | ICD-10-CM | POA: Diagnosis not present

## 2022-06-11 DIAGNOSIS — I10 Essential (primary) hypertension: Secondary | ICD-10-CM

## 2022-06-11 DIAGNOSIS — F418 Other specified anxiety disorders: Secondary | ICD-10-CM | POA: Diagnosis not present

## 2022-06-11 DIAGNOSIS — K5909 Other constipation: Secondary | ICD-10-CM

## 2022-06-11 DIAGNOSIS — R4182 Altered mental status, unspecified: Secondary | ICD-10-CM | POA: Diagnosis not present

## 2022-06-11 DIAGNOSIS — M8000XA Age-related osteoporosis with current pathological fracture, unspecified site, initial encounter for fracture: Secondary | ICD-10-CM

## 2022-06-11 DIAGNOSIS — J309 Allergic rhinitis, unspecified: Secondary | ICD-10-CM

## 2022-06-11 DIAGNOSIS — K219 Gastro-esophageal reflux disease without esophagitis: Secondary | ICD-10-CM

## 2022-06-11 DIAGNOSIS — F0390 Unspecified dementia without behavioral disturbance: Secondary | ICD-10-CM

## 2022-06-11 NOTE — Assessment & Plan Note (Signed)
on Tylenol, ambulates with walker 

## 2022-06-11 NOTE — Assessment & Plan Note (Signed)
stable, on Famotidine, Maalox qd/prn, Hgb 13.4 06/09/22 

## 2022-06-11 NOTE — Progress Notes (Signed)
Location:   SNF [place] Room Number: 481 EHUDJ of Service:  SNF (31) Provider: [facility] Chiniqua Kilcrease NP  [name], MD  Patient Care Team: [name], MD as PCP - General (Internal Medicine) [name], MD (Ophthalmology) [name], MD (Psychiatry) Soniya Ashraf X, NP as Nurse Practitioner (Nurse Practitioner) [name], MD as Consulting Physician (Orthopedic Surgery)  Extended Emergency Contact Information Primary Emergency Contact: [name], [place] Work Phone: [phone] Relation: Son Secondary Emergency Contact: [name] [place] Phone: [phone] Relation: Sister  Code Status: DNR Goals of care: Advanced Directive information    06/04/2022    2:32 PM  Advanced Directives  Does Patient Have a Medical Advance Directive? Yes  Type of Paramedic of [place];Living will;Out of facility DNR (pink MOST or yellow form)  Does patient want to make changes to medical advance directive? No - Patient declined  Copy of [place] in Chart? Yes - validated most recent copy scanned in chart (See row information)  Pre-existing out of facility DNR order (yellow form or pink MOST form) Pink MOST/Yellow Form most recent copy in chart - Physician notified to receive inpatient order     Chief Complaint  Patient presents with   Acute Visit    Medication review following hospital stay.     HPI:  Pt is a 81 y.o. female seen today for an acute visit for medication reviewed following hospital stay.   Hospitalized 06/07/22-06/09/22 for AMS, resolved upon discharge, unremarkable CT head, BMP, CBC, UA, influenza A/B, Covid 19, MRI bran, MRA, EEG no seizure, weaning off Ativan to switch to Clonopin to minimize withdrawal, BMP, CBC one week.     HTN, blood pressure is controlled on diuretics. Bun/creat 14/0.95 06/09/22             OA on Tylenol, ambulates with  walker.              GERD, stable, on Famotidine, Maalox qd/prn, Hgb 13.4 06/09/22             Depression/anxiety, resume Amitriptyline at lower dose '25mg'$  qd, off Librium, continued Fluphenazine, replaced Lorazepam with Clonazepam, TSH 1.62 08/15/21             Dementia, supportive care in memory care unit [facility] since Flu 08/2021. CT head was negative for acute process. Taking Memantine.              Chronic constipation, on prn Dulcolax po/suppository, prn MOM, MiraLax qd/bid alternative.              Allergic rhinitis, takes Claritin, Flonase, Mucinex        OP, stopped taking Prolia due to cost, ? Allergic to Fosamax.  her son declined DEXA Past Medical History:  Diagnosis Date   Allergy    Anemia    Anxiety    Asthma    CAD (coronary artery disease)    Depression    Diverticulosis    Dysfunction of eustachian tube    Elevated LFTs    Esophageal reflux    Esophageal stricture 2002   Hemorrhoids    History of rectal polyps    HLD (hyperlipidemia)    HTN (hypertension)    Hypertonicity of bladder    Irritable bowel syndrome with constipation    Obesity    Optic neuritis    Osteoporosis    Peptic ulcer  disease    Peripheral neuropathy    Trigeminal neuralgia    prior injury   Visual loss, bilateral    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, 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   Vitamin D deficiency    Past Surgical History:  Procedure Laterality Date   ABDOMINAL HYSTERECTOMY     ANAL RECTAL MANOMETRY N/A 07/05/2014   Procedure: ANO RECTAL MANOMETRY;  Surgeon: Leighton Ruff, MD;  Location: [facility];  Service: Endoscopy;  Laterality: N/A;   APPENDECTOMY     CAROTID ENDARTERECTOMY     COLONOSCOPY  2008, 2015   TONSILLECTOMY     UPPER GASTROINTESTINAL ENDOSCOPY  2002    Allergies  Allergen Reactions   Biaxin [Clarithromycin]    Clarithromycin    Doxycycline    Fosamax [Alendronate Sodium]    Geodon [Ziprasidone Hydrochloride]    Lipitor [Atorvastatin Calcium]     Penicillins    Pravachol [Pravastatin Sodium]    Statins    Sulfonamide Derivatives    Zithromax [Azithromycin]    Zocor [Simvastatin]     Allergies as of 06/11/2022       Reactions   Biaxin [clarithromycin]    Clarithromycin    Doxycycline    Fosamax [alendronate Sodium]    Geodon [ziprasidone Hydrochloride]    Lipitor [atorvastatin Calcium]    Penicillins    Pravachol [pravastatin Sodium]    Statins    Sulfonamide Derivatives    Zithromax [azithromycin]    Zocor [simvastatin]         Medication List        Accurate as of June 11, 2022 12:02 PM. If you have any questions, ask your nurse or doctor.          acetaminophen 500 MG tablet Commonly known as: TYLENOL Take 2 tablets (1,000 mg total) by mouth every 8 (eight) hours as needed for up to 10 days for mild pain, moderate pain, headache or fever.   alum & mag hydroxide-simeth 200-200-20 MG/5ML suspension Commonly known as: MAALOX/MYLANTA Take 30 mLs by mouth at bedtime.   alum & mag hydroxide-simeth 200-200-20 MG/5ML suspension Commonly known as: MAALOX/MYLANTA Take 30 mLs by mouth every 4 (four) hours as needed for indigestion or heartburn.   ARTIFICIAL TEAR OP Apply 1 drop to eye 3 (three) times daily.   aspirin 81 MG chewable tablet Chew 81 mg by mouth daily.   bisacodyl 10 MG suppository Commonly known as: DULCOLAX Place 10 mg rectally daily as needed for moderate constipation.   bisacodyl 5 MG EC tablet Commonly known as: DULCOLAX Take 5 mg by mouth daily as needed for moderate constipation.   carbamide peroxide 6.5 % OTIC solution Commonly known as: DEBROX 5 drops as needed (impaction). 5 drops to affected ear, otic (ear). At bedtime, PRN, Identify cerumen impaction by direct visualization by use of otoscope. Place 5 drops to affected ear QHS x3 nights then irrigate with bulb syringe. If not effective notify MD.   diclofenac Sodium 1 % Gel Commonly known as: VOLTAREN Apply 2 g topically  2 (two) times daily as needed (pain). apply to lower back area   famotidine 20 MG tablet Commonly known as: PEPCID Take 20 mg by mouth at bedtime.   fluPHENAZine 1 MG tablet Commonly known as: PROLIXIN Take 1 mg by mouth at bedtime.   furosemide 20 MG tablet Commonly known as: LASIX Take 20 mg by mouth daily.   guaiFENesin 600 MG 12 hr tablet Commonly known as: MUCINEX Take 600 mg by  mouth daily as needed (Cold or cogestion).   hydrocortisone valerate cream 0.2 % Commonly known as: WESTCORT Apply 1 application  topically 2 (two) times daily.   ketoconazole 2 % shampoo Commonly known as: NIZORAL Apply 1 application. topically 2 (two) times a week. Tuesday and Friday   ketoconazole 2 % cream Commonly known as: NIZORAL Apply 1 application  topically 2 (two) times daily.   loratadine 10 MG tablet Commonly known as: CLARITIN Take 10 mg by mouth daily.   LORazepam 0.5 MG tablet Commonly known as: ATIVAN Take 1 tablet (0.5 mg total) by mouth 2 (two) times daily.   magnesium hydroxide 400 MG/5ML suspension Commonly known as: MILK OF MAGNESIA Take 30 mLs by mouth daily as needed for mild constipation.   memantine 10 MG tablet Commonly known as: NAMENDA Take 10 mg by mouth 2 (two) times daily.   MIRALAX PO Take 17 g by mouth See admin instructions. Take 17 gm, oral twice a day, every other day. Miralax 17 gm + 4-8oz fluid mizture BID   ORAJEL MT Use as directed in the mouth or throat. 20-0.26-0.15 %; amt: SMALL AMOUNT Three Times A Day - PRN;  mucous membrane Special Instructions: Apply to right inner mouth sore TID PRN [DX: Other specified disorders of teeth and supporting structures]  Every 4 Hours - PRN;may self-administer   OYSTER SHELL PO Take 250 mg by mouth in the morning and at bedtime.   saccharomyces boulardii 250 MG capsule Commonly known as: FLORASTOR Take 250 mg by mouth daily.   Sentry Senior Tabs Take 1 tablet by mouth daily. 500-300-250 mcg [DX:  Vitamin deficiency, unspecified]   spironolactone 25 MG tablet Commonly known as: ALDACTONE Take 12.5 mg by mouth daily.   triamcinolone cream 0.1 % Commonly known as: KENALOG Apply 1 application  topically as needed (rash). Apply BID as needed to rash to chest, behind L ear, left posterior neck.        Review of Systems  Immunization History  Administered Date(s) Administered   Influenza Split 07/23/2013   Influenza Whole 07/05/2009, 06/26/2018   Influenza-Unspecified 07/29/2014, 06/14/2015, 07/11/2016, 07/15/2017, 06/27/2019, 07/05/2020, 07/13/2021   Moderna SARS-COV2 Booster Vaccination 02/09/2022   Moderna Sars-Covid-2 Vaccination 09/26/2019, 10/24/2019, 08/02/2020   PFIZER(Purple Top)SARS-COV-2 Vaccination 09/26/2019   PPD Test 06/17/2013   Pneumococcal Conjugate-13 07/22/2017   Pneumococcal Polysaccharide-23 05/05/2009   Td 05/05/2009, 07/16/2019   Unspecified SARS-COV-2 Vaccination 02/21/2021, 06/13/2021   Zoster Recombinat (Shingrix) 09/27/2021, 12/05/2021   Zoster, Live 01/09/2011   Pertinent  Health Maintenance Due  Topic Date Due   INFLUENZA VACCINE  04/24/2022   DEXA SCAN  Completed      06/07/2022    5:37 PM 06/08/2022    4:25 AM 06/08/2022    5:57 PM 06/08/2022   11:00 PM 06/09/2022   10:00 AM  Fall Risk  Patient Fall Risk Level Moderate fall risk High fall risk High fall risk High fall risk High fall risk   Functional Status Survey:    Vitals:   06/11/22 1042  BP: 123/82  Pulse: 89  Resp: 18  Temp: 98.2 F (36.8 C)  SpO2: 98%   There is no height or weight on file to calculate BMI. Physical Exam  Labs reviewed: Recent Labs    06/07/22 1641 06/07/22 1646 06/08/22 0032 06/09/22 0504  NA 139 138 139 141  K 4.7 5.0 4.8 4.3  CL 101 103 104 107  CO2 25  --  25 22  GLUCOSE 98 97 102*  99  BUN 19 27* 21 14  CREATININE 1.16* 1.00 0.92 0.95  CALCIUM 9.5  --  9.1 9.4  MG  --   --  2.3 2.1  PHOS  --   --  3.8 3.3   Recent Labs     04/26/22 0000 06/07/22 1641 06/08/22 0032 06/09/22 0504  AST 23 29 34  --   ALT '16 18 19  '$ --   ALKPHOS 130* 100 98  --   BILITOT  --  0.3 0.6  --   PROT  --  7.2 6.7  --   ALBUMIN 4.6 4.0 3.8 3.5   Recent Labs    09/19/21 0000 04/26/22 0000 06/07/22 1641 06/07/22 1646 06/08/22 0032 06/09/22 0504  WBC 10.3 5.9 6.4  --  9.2 7.4  NEUTROABS 6,365.00 3,039.00 3.1  --   --   --   HGB 11.9* 13.6 15.1* 16.3* 12.8 13.4  HCT 37 40 46.3* 48.0* 39.3 39.7  MCV  --   --  96.9  --  95.6 95.4  PLT 465* 353 303  --  333 304   Lab Results  Component Value Date   TSH 1.62 08/15/2021   Lab Results  Component Value Date   HGBA1C 5.6 06/08/2022   Lab Results  Component Value Date   CHOL 201 (H) 06/08/2022   HDL 71 06/08/2022   LDLCALC 113 (H) 06/08/2022   LDLDIRECT 190.0 03/24/2012   TRIG 84 06/08/2022   CHOLHDL 2.8 06/08/2022    Significant Diagnostic Results in last 30 days:  ECHOCARDIOGRAM COMPLETE  Result Date: 06/08/2022    ECHOCARDIOGRAM REPORT   Patient Name:   Jean [name] Date of Exam: 06/08/2022 Medical Rec #:  [id number]      Height:       65.0 in Accession #:    [id number]     Weight:       151.7 lb Date of Birth:  08/27/1941     BSA:          1.759 m Patient Age:    14 years       BP:           161/48 mmHg Patient Gender: F              HR:           86 bpm. Exam Location:  Inpatient Procedure: 2D Echo, Cardiac Doppler and Color Doppler Indications:    TIA  History:        Patient has no prior history of Echocardiogram examinations.                 Risk Factors:Dyslipidemia and Hypertension. PVD. CKD.  Sonographer:    Clayton Lefort RDCS (AE) Referring Phys: Derek Jack  Sonographer Comments: Technically difficult study due to poor echo windows. IMPRESSIONS  1. Left ventricular ejection fraction, by estimation, is 55 to 60%. The left ventricle has normal function. Left ventricular endocardial border not optimally defined to evaluate regional wall motion. Left ventricular  diastolic parameters are indeterminate.  2. Right ventricular systolic function is normal. The right ventricular size is normal.  3. The mitral valve was not well visualized. No evidence of mitral valve regurgitation. No evidence of mitral stenosis.  4. The aortic valve was not well visualized. Aortic valve regurgitation is not visualized. No aortic stenosis is present. Comparison(s): No prior Echocardiogram. Conclusion(s)/Recommendation(s): Very limited imaged due to poor echo windows. Grossly normal RV and LV function. No severe valve  disease by Doppler, but valves not well visualized. Consider TEE if indicated to exclude cardioembolic source. FINDINGS  Left Ventricle: Left ventricular ejection fraction, by estimation, is 55 to 60%. The left ventricle has normal function. Left ventricular endocardial border not optimally defined to evaluate regional wall motion. The left ventricular internal cavity size was normal in size. There is borderline left ventricular hypertrophy. Left ventricular diastolic parameters are indeterminate. Right Ventricle: The right ventricular size is normal. Right vetricular wall thickness was not well visualized. Right ventricular systolic function is normal. Left Atrium: Left atrial size was normal in size. Right Atrium: Right atrial size was normal in size. Pericardium: There is no evidence of pericardial effusion. Presence of epicardial fat layer. Mitral Valve: The mitral valve was not well visualized. No evidence of mitral valve regurgitation. No evidence of mitral valve stenosis. Tricuspid Valve: The tricuspid valve is not well visualized. Tricuspid valve regurgitation is trivial. No evidence of tricuspid stenosis. Aortic Valve: The aortic valve was not well visualized. Aortic valve regurgitation is not visualized. No aortic stenosis is present. Aortic valve mean gradient measures 3.0 mmHg. Aortic valve peak gradient measures 3.9 mmHg. Aortic valve area, by VTI measures 3.60 cm.  Pulmonic Valve: The pulmonic valve was not well visualized. Pulmonic valve regurgitation is not visualized. Aorta: The aortic root and ascending aorta are structurally normal, with no evidence of dilitation. Venous: The inferior vena cava was not well visualized. IAS/Shunts: The interatrial septum was not well visualized.  LEFT VENTRICLE PLAX 2D LVIDd:         4.40 cm   Diastology LV PW:         1.30 cm   LV e' medial:    3.48 cm/s LV IVS:        1.10 cm   LV E/e' medial:  16.3 LVOT diam:     2.30 cm   LV e' lateral:   7.72 cm/s LV SV:         74        LV E/e' lateral: 7.3 LV SV Index:   42 LVOT Area:     4.15 cm  RIGHT VENTRICLE RV Basal diam:  2.70 cm RV S prime:     12.80 cm/s TAPSE (M-mode): 1.4 cm LEFT ATRIUM             Index        RIGHT ATRIUM           Index LA diam:        4.20 cm 2.39 cm/m   RA Area:     12.60 cm LA Vol (A2C):   19.7 ml 11.20 ml/m  RA Volume:   29.70 ml  16.89 ml/m LA Vol (A4C):   38.9 ml 22.12 ml/m LA Biplane Vol: 30.7 ml 17.46 ml/m  AORTIC VALVE AV Area (Vmax):    3.35 cm AV Area (Vmean):   3.48 cm AV Area (VTI):     3.60 cm AV Vmax:           99.10 cm/s AV Vmean:          74.900 cm/s AV VTI:            0.204 m AV Peak Grad:      3.9 mmHg AV Mean Grad:      3.0 mmHg LVOT Vmax:         80.00 cm/s LVOT Vmean:        62.800 cm/s LVOT VTI:  0.177 m LVOT/AV VTI ratio: 0.87  AORTA Ao Root diam: 2.70 cm Ao Asc diam:  2.90 cm MITRAL VALVE MV Area (PHT): 3.19 cm    SHUNTS MV Decel Time: 238 msec    Systemic VTI:  0.18 m MV E velocity: 56.70 cm/s  Systemic Diam: 2.30 cm MV A velocity: 96.20 cm/s MV E/A ratio:  0.59 Buford Dresser MD Electronically signed by Buford Dresser MD Signature Date/Time: 06/08/2022/6:43:11 PM    Final    EEG adult  Result Date: 06/08/2022 Lora Havens, MD     06/08/2022  5:14 AM Patient Name: NARYAH CLENNEY MRN: [id number] Epilepsy Attending: Lora Havens Referring Physician/Provider: Derek Jack, MD Date: 06/08/2022  Duration: 21.48 mins Patient history: 81yo F with ams. EEG to evaluate for seizure. Level of alertness: Awake AEDs during EEG study: Ativan, Librium Technical aspects: This EEG study was done with scalp electrodes positioned according to the 10-20 International system of electrode placement. Electrical activity was reviewed with band pass filter of 1-'70Hz'$ , sensitivity of 7 uV/mm, display speed of 77m/sec with a '60Hz'$  notched filter applied as appropriate. EEG data were recorded continuously and digitally stored.  Video monitoring was available and reviewed as appropriate. Description: No posterior dominant rhythm was seen. There was an excessive amount of 15 to 18 Hz beta activity distributed symmetrically and diffusely.  Hyperventilation and photic stimulation were not performed.   ABNORMALITY - Excessive beta, generalized IMPRESSION: This study is within normal limits. The excessive beta activity seen in the background is most likely due to the effect of benzodiazepine and is a benign EEG pattern. No seizures or epileptiform discharges were seen throughout the recording. A normal interictal EEG does not exclude nor support the diagnosis of epilepsy. PLora Havens  MR BRAIN W WO CONTRAST  Result Date: 06/08/2022 CLINICAL DATA:  Transient ischemic attack.  New onset seizure. EXAM: MRI HEAD WITHOUT AND WITH CONTRAST MRA HEAD WITHOUT CONTRAST MRA NECK WITHOUT AND WITH CONTRAST TECHNIQUE: Multiplanar, multiecho pulse sequences of the brain and surrounding structures were obtained without and with intravenous contrast. Angiographic images of the Circle of Willis were obtained using MRA technique without intravenous contrast. Angiographic images of the neck were obtained using MRA technique without and with intravenous contrast. Carotid stenosis measurements (when applicable) are obtained utilizing NASCET criteria, using the distal internal carotid diameter as the denominator. CONTRAST:  6.5 mL gadopiclenol  (Vueway) 0.5 mmol/ml solution COMPARISON:  None Available. FINDINGS: MRI HEAD FINDINGS Brain: No acute infarct, mass effect or extra-axial collection. No acute or chronic hemorrhage. There is multifocal hyperintense T2-weighted signal within the white matter. Generalized volume loss. The midline structures are normal. There is no abnormal contrast enhancement. Vascular: Major flow voids are preserved. Skull and upper cervical spine: Normal calvarium and skull base. Visualized upper cervical spine and soft tissues are normal. Sinuses/Orbits:No paranasal sinus fluid levels or advanced mucosal thickening. No mastoid or middle ear effusion. Normal orbits. MRA HEAD FINDINGS POSTERIOR CIRCULATION: --Vertebral arteries: Normal --Inferior cerebellar arteries: Normal. --Basilar artery: Normal. --Superior cerebellar arteries: Normal. --Posterior cerebral arteries: Normal. ANTERIOR CIRCULATION: --Intracranial internal carotid arteries: Normal. --Anterior cerebral arteries (ACA): Normal. --Middle cerebral arteries (MCA): At the right MCA bifurcation, there is a laterally projecting outpouching that measures 2 mm. This may be a small aneurysm or a chronically occluded branch vessel. Otherwise, the middle cerebral arteries are normal. ANATOMIC VARIANTS: None MRA NECK FINDINGS Left dominant vertebral arteries are patent and of normal caliber. Both carotid systems are normal  without stenosis. IMPRESSION: 1. No acute intracranial abnormality. 2. 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 3. No emergent large vessel occlusion or high-grade stenosis. 4. A 2 mm laterally projecting outpouching at the right MCA bifurcation may be a small aneurysm or an occluded branch vessel. Given the lack of acute ischemia, this is unlikely to be an acute occlusion. Electronically Signed   By: Ulyses Jarred M.D.   On: 06/08/2022 01:42   MR ANGIO HEAD WO CONTRAST  Result Date: 06/08/2022 CLINICAL DATA:  Transient ischemic attack.  New onset  seizure. EXAM: MRI HEAD WITHOUT AND WITH CONTRAST MRA HEAD WITHOUT CONTRAST MRA NECK WITHOUT AND WITH CONTRAST TECHNIQUE: Multiplanar, multiecho pulse sequences of the brain and surrounding structures were obtained without and with intravenous contrast. Angiographic images of the Circle of Willis were obtained using MRA technique without intravenous contrast. Angiographic images of the neck were obtained using MRA technique without and with intravenous contrast. Carotid stenosis measurements (when applicable) are obtained utilizing NASCET criteria, using the distal internal carotid diameter as the denominator. CONTRAST:  6.5 mL gadopiclenol (Vueway) 0.5 mmol/ml solution COMPARISON:  None Available. FINDINGS: MRI HEAD FINDINGS Brain: No acute infarct, mass effect or extra-axial collection. No acute or chronic hemorrhage. There is multifocal hyperintense T2-weighted signal within the white matter. Generalized volume loss. The midline structures are normal. There is no abnormal contrast enhancement. Vascular: Major flow voids are preserved. Skull and upper cervical spine: Normal calvarium and skull base. Visualized upper cervical spine and soft tissues are normal. Sinuses/Orbits:No paranasal sinus fluid levels or advanced mucosal thickening. No mastoid or middle ear effusion. Normal orbits. MRA HEAD FINDINGS POSTERIOR CIRCULATION: --Vertebral arteries: Normal --Inferior cerebellar arteries: Normal. --Basilar artery: Normal. --Superior cerebellar arteries: Normal. --Posterior cerebral arteries: Normal. ANTERIOR CIRCULATION: --Intracranial internal carotid arteries: Normal. --Anterior cerebral arteries (ACA): Normal. --Middle cerebral arteries (MCA): At the right MCA bifurcation, there is a laterally projecting outpouching that measures 2 mm. This may be a small aneurysm or a chronically occluded branch vessel. Otherwise, the middle cerebral arteries are normal. ANATOMIC VARIANTS: None MRA NECK FINDINGS Left dominant  vertebral arteries are patent and of normal caliber. Both carotid systems are normal without stenosis. IMPRESSION: 1. No acute intracranial abnormality. 2. 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 3. No emergent large vessel occlusion or high-grade stenosis. 4. A 2 mm laterally projecting outpouching at the right MCA bifurcation may be a small aneurysm or an occluded branch vessel. Given the lack of acute ischemia, this is unlikely to be an acute occlusion. Electronically Signed   By: Ulyses Jarred M.D.   On: 06/08/2022 01:42   MR ANGIO NECK W WO CONTRAST  Result Date: 06/08/2022 CLINICAL DATA:  Transient ischemic attack.  New onset seizure. EXAM: MRI HEAD WITHOUT AND WITH CONTRAST MRA HEAD WITHOUT CONTRAST MRA NECK WITHOUT AND WITH CONTRAST TECHNIQUE: Multiplanar, multiecho pulse sequences of the brain and surrounding structures were obtained without and with intravenous contrast. Angiographic images of the Circle of Willis were obtained using MRA technique without intravenous contrast. Angiographic images of the neck were obtained using MRA technique without and with intravenous contrast. Carotid stenosis measurements (when applicable) are obtained utilizing NASCET criteria, using the distal internal carotid diameter as the denominator. CONTRAST:  6.5 mL gadopiclenol (Vueway) 0.5 mmol/ml solution COMPARISON:  None Available. FINDINGS: MRI HEAD FINDINGS Brain: No acute infarct, mass effect or extra-axial collection. No acute or chronic hemorrhage. There is multifocal hyperintense T2-weighted signal within the white matter. Generalized volume loss.  The midline structures are normal. There is no abnormal contrast enhancement. Vascular: Major flow voids are preserved. Skull and upper cervical spine: Normal calvarium and skull base. Visualized upper cervical spine and soft tissues are normal. Sinuses/Orbits:No paranasal sinus fluid levels or advanced mucosal thickening. No mastoid or middle ear effusion.  Normal orbits. MRA HEAD FINDINGS POSTERIOR CIRCULATION: --Vertebral arteries: Normal --Inferior cerebellar arteries: Normal. --Basilar artery: Normal. --Superior cerebellar arteries: Normal. --Posterior cerebral arteries: Normal. ANTERIOR CIRCULATION: --Intracranial internal carotid arteries: Normal. --Anterior cerebral arteries (ACA): Normal. --Middle cerebral arteries (MCA): At the right MCA bifurcation, there is a laterally projecting outpouching that measures 2 mm. This may be a small aneurysm or a chronically occluded branch vessel. Otherwise, the middle cerebral arteries are normal. ANATOMIC VARIANTS: None MRA NECK FINDINGS Left dominant vertebral arteries are patent and of normal caliber. Both carotid systems are normal without stenosis. IMPRESSION: 1. No acute intracranial abnormality. 2. 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 3. No emergent large vessel occlusion or high-grade stenosis. 4. A 2 mm laterally projecting outpouching at the right MCA bifurcation may be a small aneurysm or an occluded branch vessel. Given the lack of acute ischemia, this is unlikely to be an acute occlusion. Electronically Signed   By: Ulyses Jarred M.D.   On: 06/08/2022 01:42   CT HEAD CODE STROKE WO CONTRAST  Result Date: 06/07/2022 CLINICAL DATA:  Code stroke.  Neuro deficit, acute, stroke suspected EXAM: CT HEAD WITHOUT CONTRAST TECHNIQUE: Contiguous axial images were obtained from the base of the skull through the vertex without intravenous contrast. RADIATION DOSE REDUCTION: This exam was performed according to the departmental dose-optimization program which includes automated exposure control, adjustment of the mA and/or kV according to patient size and/or use of iterative reconstruction technique. COMPARISON:  CT head March 10, 2021. FINDINGS: Brain: No evidence of acute large vascular territory infarction, hemorrhage, hydrocephalus, extra-axial collection or mass lesion/mass effect. Mild for age patchy  white matter hypoattenuation, nonspecific but compatible with chronic microvascular ischemic disease. Vascular: No hyperdense vessel identified. Calcific intracranial atherosclerosis. Skull: No acute fracture. Sinuses/Orbits: No acute findings. ASPECTS [facility] Stroke Program Early CT Score) total score (0-10 with 10 being normal): 10. IMPRESSION: 1. No evidence of acute intracranial abnormality. 2. ASPECTS is 10. Code stroke imaging results were communicated on 06/07/2022 at 4:52 pm to provider Dr. Quinn Axe Via secure text paging. Electronically Signed   By: Margaretha Sheffield M.D.   On: 06/07/2022 16:52    Assessment/Plan: Depression with anxiety Discussed with consultant pharmacist: resume Amitriptyline at lower dose '25mg'$  qd-will tirate or wean off as needed, off Librium upon hospital discharge, continued Fluphenazine, will replace Lorazepam with Clonazepam, TSH 1.62 08/15/21. Observe.   Altered mental state Hospitalized 06/07/22-06/09/22 for AMS, resolved upon discharge, unremarkable CT head, BMP, CBC, UA, influenza A/B, Covid 19, MRI bran, MRA, EEG no seizure, weaning off Ativan to switch to Clonopin to minimize withdrawal, BMP, CBC one week.    Essential hypertension blood pressure is controlled on diuretics. 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   GERD (gastroesophageal reflux disease) stable, on Famotidine, Maalox qd/prn, Hgb 13.4 06/09/22  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[place]) supportive care in memory care unit [facility] since Flu 08/2021. CT head was negative for acute process. Taking Memantine.   Constipation Stable, on prn Dulcolax po/suppository, prn MOM, MiraLax qd/bid alternative.   Allergic rhinitis takes Claritin, Flonase, Mucinex         Osteoporosis stopped  taking Prolia due to cost, ? Allergic to Fosamax.  her son declined DEXA    Family/ staff Communication: plan of care reviewed with the patient, social  worker-emailing to update the patient HOPA son who is in [place], and Camera operator.   Labs/tests ordered:  BMP, CBC one week  Time spend 35 minutes.

## 2022-06-11 NOTE — Assessment & Plan Note (Signed)
takes Claritin, Flonase, Mucinex       

## 2022-06-11 NOTE — Assessment & Plan Note (Signed)
stopped taking Prolia due to cost, ? Allergic to Fosamax.  her son declined DEXA 

## 2022-06-11 NOTE — Assessment & Plan Note (Signed)
Discussed with consultant pharmacist: resume Amitriptyline at lower dose '25mg'$  qd-will tirate or wean off as needed, off Librium upon hospital discharge, continued Fluphenazine, will replace Lorazepam with Clonazepam, TSH 1.62 08/15/21. Observe.

## 2022-06-11 NOTE — Assessment & Plan Note (Signed)
supportive care in memory care unit [facility] since Flu 08/2021. CT head was negative for acute process. Taking Memantine.  

## 2022-06-11 NOTE — Assessment & Plan Note (Signed)
blood pressure is controlled on diuretics. Bun/creat 14/0.95 06/09/22 

## 2022-06-11 NOTE — Assessment & Plan Note (Signed)
Hospitalized 06/07/22-06/09/22 for AMS, resolved upon discharge, unremarkable CT head, BMP, CBC, UA, influenza A/B, Covid 19, MRI bran, MRA, EEG no seizure, weaning off Ativan to switch to Clonopin to minimize withdrawal, BMP, CBC one week.

## 2022-06-11 NOTE — Assessment & Plan Note (Signed)
Stable, on prn Dulcolax po/suppository, prn MOM, MiraLax qd/bid alternative.  

## 2022-06-12 ENCOUNTER — Non-Acute Institutional Stay (SKILLED_NURSING_FACILITY): Payer: Medicare Other | Admitting: Family Medicine

## 2022-06-12 ENCOUNTER — Encounter: Payer: Self-pay | Admitting: Family Medicine

## 2022-06-12 DIAGNOSIS — F418 Other specified anxiety disorders: Secondary | ICD-10-CM

## 2022-06-12 DIAGNOSIS — G9340 Encephalopathy, unspecified: Secondary | ICD-10-CM

## 2022-06-12 DIAGNOSIS — N1832 Chronic kidney disease, stage 3b: Secondary | ICD-10-CM | POA: Diagnosis not present

## 2022-06-12 NOTE — Progress Notes (Unsigned)
Provider: Lillette Boxer. Miller,MD  Location:  [place] [facility] Room Number: 101-A Place of Service:  SNF (31)  PCP: [name], MD Patient Care Team: [name], MD as PCP - General (Internal Medicine) [name], MD (Ophthalmology) [name], MD (Psychiatry) Mast, Man X, NP as Nurse Practitioner (Nurse Practitioner) [name], MD as Consulting Physician (Orthopedic Surgery)  Extended Emergency Contact Information Primary Emergency Contact: Jean [name], Time of [place] Work Phone: [phone] Relation: Son Secondary Emergency Contact: Jean [name] States of [place] Phone: [phone] Relation: Sister  Code Status: DNR Goals of Care: Advanced Directive information    06/12/2022   10:46 AM  Advanced Directives  Does Patient Have a Medical Advance Directive? Yes  Type of Paramedic of [place];Living will;Out of facility DNR (pink MOST or yellow form)  Does patient want to make changes to medical advance directive? No - Patient declined  Copy of [place] in Chart? Yes - validated most recent copy scanned in chart (See row information)  Pre-existing out of facility DNR order (yellow form or pink MOST form) Pink MOST/Yellow Form most recent copy in chart - Physician notified to receive inpatient order      Chief Complaint  Patient presents with   New Admit To SNF    HPI: Patient is a 81 y.o. female seen today for admission to  Past Medical History:  Diagnosis Date   Allergy    Anemia    Anxiety    Asthma    CAD (coronary artery disease)    Depression    Diverticulosis    Dysfunction of eustachian tube    Elevated LFTs    Esophageal reflux    Esophageal stricture 2002   Hemorrhoids    History of rectal polyps    HLD (hyperlipidemia)    HTN (hypertension)    Hypertonicity of bladder    Irritable bowel syndrome with constipation     Obesity    Optic neuritis    Osteoporosis    Peptic ulcer disease    Peripheral neuropathy    Trigeminal neuralgia    prior injury   Visual loss, bilateral    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, 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   Vitamin D deficiency    Past Surgical History:  Procedure Laterality Date   ABDOMINAL HYSTERECTOMY     ANAL RECTAL MANOMETRY N/A 07/05/2014   Procedure: [place];  Surgeon: Leighton Ruff, MD;  Location: [facility];  Service: Endoscopy;  Laterality: N/A;   APPENDECTOMY     CAROTID ENDARTERECTOMY     COLONOSCOPY  2008, 2015   TONSILLECTOMY     UPPER GASTROINTESTINAL ENDOSCOPY  2002    reports that she has never smoked. She has never used smokeless tobacco. She reports that she does not drink alcohol and does not use drugs. Social History   Socioeconomic History   Marital status: Divorced    Spouse name: Not on file   Number of children: 2   Years of education: Not on file   Highest education level: Not on file  Occupational History   Occupation: retired    Fish farm manager: RETIRED  Tobacco Use   Smoking status: Never   Smokeless tobacco: Never  Vaping Use   Vaping Use: Never used  Substance and Sexual Activity   Alcohol use: No    Alcohol/week: 0.0 standard drinks of alcohol  Drug use: No   Sexual activity: Never  Other Topics Concern   Not on file  Social History Narrative   HSG, business college   Married 59-65yr/divorced   2 son-'60; 2 granddaughters   Work: sNetwork engineer travel clerk   [place]   Patient never smoked. Did have second hand smoke exposure childhood and work   Alcohol none   Walks with walker   Social Determinants of Health   Financial Resource Strain: Low Risk  (06/10/2018)   Overall Financial Resource Strain (CARDIA)    Difficulty of Paying Living Expenses: Not hard at all  Food Insecurity: No Food Insecurity (06/10/2018)   Hunger Vital Sign    Worried  About Running Out of Food in the Last Year: Never true    Ran Out of Food in the Last Year: Never true  Transportation Needs: No Transportation Needs (06/10/2018)   PRAPARE - THydrologist(Medical): No    Lack of Transportation (Non-Medical): No  Physical Activity: Sufficiently Active (06/10/2018)   Exercise Vital Sign    Days of Exercise per Week: 5 days    Minutes of Exercise per Session: 30 min  Stress: No Stress Concern Present (06/10/2018)   [place]   Feeling of Stress : Not at all  Social Connections: Moderately Isolated (06/10/2018)   Social Connection and Isolation Panel [NHANES]    Frequency of Communication with Friends and Family: Twice a week    Frequency of Social Gatherings with Friends and Family: Twice a week    Attends Religious Services: Never    AMarine scientistor Organizations: No    Attends CArchivistMeetings: Never    Marital Status: Divorced  IHuman resources officerViolence: Not At Risk (06/10/2018)   Humiliation, Afraid, Rape, and Kick questionnaire    Fear of Current or Ex-Partner: No    Emotionally Abused: No    Physically Abused: No    Sexually Abused: No    Functional Status Survey:    Family History  Problem Relation Age of Onset   Alzheimer's disease Mother    Coronary artery disease Father    Heart attack Father    Heart disease Father    Anuerysm Father        AAA   Ovarian cancer Sister    Lung cancer Brother    Heart disease Brother    Anuerysm Brother        AAA   Diabetes Neg Hx    Colon cancer Neg Hx     Health Maintenance  Topic Date Due   INFLUENZA VACCINE  04/24/2022   COVID-19 Vaccine (7 - Mixed Product series) 06/12/2022   TETANUS/TDAP  07/15/2029   Pneumonia Vaccine 81 Years old  Completed   DEXA SCAN  Completed   Zoster Vaccines- Shingrix  Completed   HPV VACCINES  Aged Out    Allergies  Allergen Reactions    Biaxin [Clarithromycin]    Clarithromycin    Doxycycline    Fosamax [Alendronate Sodium]    Geodon [Ziprasidone Hydrochloride]    Lipitor [Atorvastatin Calcium]    Penicillins    Pravachol [Pravastatin Sodium]    Statins    Sulfonamide Derivatives    Zithromax [Azithromycin]    Zocor [Simvastatin]     Outpatient Encounter Medications as of 06/12/2022  Medication Sig   acetaminophen (TYLENOL) 500 MG tablet Take 2  tablets (1,000 mg total) by mouth every 8 (eight) hours as needed for up to 10 days for mild pain, moderate pain, headache or fever.   alum & mag hydroxide-simeth (MAALOX/MYLANTA) 200-200-20 MG/5ML suspension Take 30 mLs by mouth every 4 (four) hours as needed for indigestion or heartburn.   amitriptyline (ELAVIL) 25 MG tablet Take 25 mg by mouth daily.   ARTIFICIAL TEAR OP Apply 1 drop to eye 3 (three) times daily.   aspirin 81 MG chewable tablet Chew 81 mg by mouth daily.   Benzocaine (ORAJEL MT) Use as directed in the mouth or throat. 20-0.26-0.15 %; amt: SMALL AMOUNT Three Times A Day - PRN;  mucous membrane Special Instructions: Apply to right inner mouth sore TID PRN [DX: Other specified disorders of teeth and supporting structures]  Every 4 Hours - PRN;may self-administer   bisacodyl (DULCOLAX) 10 MG suppository Place 10 mg rectally daily as needed for moderate constipation.   bisacodyl (DULCOLAX) 5 MG EC tablet Take 5 mg by mouth daily as needed for moderate constipation.   carbamide peroxide (DEBROX) 6.5 % OTIC solution 5 drops as needed (impaction). 5 drops to affected ear, otic (ear). At bedtime, PRN, Identify cerumen impaction by direct visualization by use of otoscope. Place 5 drops to affected ear QHS x3 nights then irrigate with bulb syringe. If not effective notify MD.   diclofenac Sodium (VOLTAREN) 1 % GEL Apply 2 g topically 2 (two) times daily as needed (pain). apply to lower back area   famotidine (PEPCID) 20 MG tablet Take 20 mg by mouth at bedtime.    fluPHENAZine (PROLIXIN) 1 MG tablet Take 1 mg by mouth at bedtime.   furosemide (LASIX) 20 MG tablet Take 20 mg by mouth daily.   guaiFENesin (MUCINEX) 600 MG 12 hr tablet Take 600 mg by mouth daily as needed (Cold or cogestion).   hydrocortisone valerate cream (WESTCORT) 0.2 % Apply 1 application  topically 2 (two) times daily. APPLY TO RASH ON FACE AND NECK TWICE DAILY AS NEEDED   ketoconazole (NIZORAL) 2 % cream Apply 1 application  topically 2 (two) times daily. APPLY TO RASH ON FACE AND NECK TWICE DAILY AS NEEDED   ketoconazole (NIZORAL) 2 % shampoo Apply 1 application. topically 2 (two) times a week. Tuesday and Friday   loratadine (CLARITIN) 10 MG tablet Take 10 mg by mouth daily.   LORazepam (ATIVAN) 0.5 MG tablet Take 1 tablet (0.5 mg total) by mouth 2 (two) times daily.   magnesium hydroxide (MILK OF MAGNESIA) 400 MG/5ML suspension Take 30 mLs by mouth daily as needed for mild constipation.   memantine (NAMENDA) 10 MG tablet Take 10 mg by mouth 2 (two) times daily.   Multiple Vitamins-Minerals (SENTRY SENIOR) TABS Take 1 tablet by mouth daily. 500-300-250 mcg [DX: Vitamin deficiency, unspecified]   OYSTER SHELL PO Take 250 mg by mouth in the morning and at bedtime.   Polyethylene Glycol 3350 (MIRALAX PO) Take 17 g by mouth See admin instructions. Take 17 gm, oral twice a day, every other day. Miralax 17 gm + 4-8oz fluid mizture BID   saccharomyces boulardii (FLORASTOR) 250 MG capsule Take 250 mg by mouth daily.   spironolactone (ALDACTONE) 25 MG tablet Take 12.5 mg by mouth daily.   triamcinolone cream (KENALOG) 0.1 % Apply 1 application  topically as needed (rash). Apply BID as needed to rash to chest, behind L ear, left posterior neck.   alum & mag hydroxide-simeth (MAALOX/MYLANTA) 200-200-20 MG/5ML suspension Take 30 mLs by mouth  at bedtime.   No facility-administered encounter medications on file as of 06/12/2022.    Review of Systems  Vitals:   06/12/22 1041  BP: (!) 160/64   Pulse: 78  Resp: 20  Temp: 97.9 F (36.6 C)  SpO2: 99%  Weight: 130 lb 3.2 oz (59.1 kg)  Height: '5\' 5"'$  (1.651 m)   Body mass index is 21.67 kg/m. Physical Exam  Labs reviewed: Basic Metabolic Panel: Recent Labs    06/07/22 1641 06/07/22 1646 06/08/22 0032 06/09/22 0504  NA 139 138 139 141  K 4.7 5.0 4.8 4.3  CL 101 103 104 107  CO2 25  --  25 22  GLUCOSE 98 97 102* 99  BUN 19 27* 21 14  CREATININE 1.16* 1.00 0.92 0.95  CALCIUM 9.5  --  9.1 9.4  MG  --   --  2.3 2.1  PHOS  --   --  3.8 3.3   Liver Function Tests: Recent Labs    04/26/22 0000 06/07/22 1641 06/08/22 0032 06/09/22 0504  AST 23 29 34  --   ALT '16 18 19  '$ --   ALKPHOS 130* 100 98  --   BILITOT  --  0.3 0.6  --   PROT  --  7.2 6.7  --   ALBUMIN 4.6 4.0 3.8 3.5   No results for input(s): "LIPASE", "AMYLASE" in the last 8760 hours. No results for input(s): "AMMONIA" in the last 8760 hours. CBC: Recent Labs    09/19/21 0000 04/26/22 0000 06/07/22 1641 06/07/22 1646 06/08/22 0032 06/09/22 0504  WBC 10.3 5.9 6.4  --  9.2 7.4  NEUTROABS 6,365.00 3,039.00 3.1  --   --   --   HGB 11.9* 13.6 15.1* 16.3* 12.8 13.4  HCT 37 40 46.3* 48.0* 39.3 39.7  MCV  --   --  96.9  --  95.6 95.4  PLT 465* 353 303  --  333 304   Cardiac Enzymes: No results for input(s): "CKTOTAL", "CKMB", "CKMBINDEX", "TROPONINI" in the last 8760 hours. BNP: Invalid input(s): "POCBNP" Lab Results  Component Value Date   HGBA1C 5.6 06/08/2022   Lab Results  Component Value Date   TSH 1.62 08/15/2021   Lab Results  Component Value Date   VITAMINB12 404 12/31/2010   Lab Results  Component Value Date   FOLATE 12.6 05/11/2010   No results found for: "IRON", "TIBC", "FERRITIN"  Imaging and Procedures obtained prior to SNF admission: ECHOCARDIOGRAM COMPLETE  Result Date: 06/08/2022    ECHOCARDIOGRAM REPORT   Patient Name:   Jean [name] Date of Exam: 06/08/2022 Medical Rec #:  [id number]      Height:       65.0 in  Accession #:    [id number]     Weight:       151.7 lb Date of Birth:  03-02-1941     BSA:          1.759 m Patient Age:    75 years       BP:           161/48 mmHg Patient Gender: F              HR:           86 bpm. Exam Location:  Inpatient Procedure: 2D Echo, Cardiac Doppler and Color Doppler Indications:    TIA  History:        Patient has no prior history of Echocardiogram examinations.  Risk Factors:Dyslipidemia and Hypertension. PVD. CKD.  Sonographer:    Clayton Lefort RDCS (AE) Referring Phys: Derek Jack  Sonographer Comments: Technically difficult study due to poor echo windows. IMPRESSIONS  1. Left ventricular ejection fraction, by estimation, is 55 to 60%. The left ventricle has normal function. Left ventricular endocardial border not optimally defined to evaluate regional wall motion. Left ventricular diastolic parameters are indeterminate.  2. Right ventricular systolic function is normal. The right ventricular size is normal.  3. The mitral valve was not well visualized. No evidence of mitral valve regurgitation. No evidence of mitral stenosis.  4. The aortic valve was not well visualized. Aortic valve regurgitation is not visualized. No aortic stenosis is present. Comparison(s): No prior Echocardiogram. Conclusion(s)/Recommendation(s): Very limited imaged due to poor echo windows. Grossly normal RV and LV function. No severe valve disease by Doppler, but valves not well visualized. Consider TEE if indicated to exclude cardioembolic source. FINDINGS  Left Ventricle: Left ventricular ejection fraction, by estimation, is 55 to 60%. The left ventricle has normal function. Left ventricular endocardial border not optimally defined to evaluate regional wall motion. The left ventricular internal cavity size was normal in size. There is borderline left ventricular hypertrophy. Left ventricular diastolic parameters are indeterminate. Right Ventricle: The right ventricular size is normal. Right  vetricular wall thickness was not well visualized. Right ventricular systolic function is normal. Left Atrium: Left atrial size was normal in size. Right Atrium: Right atrial size was normal in size. Pericardium: There is no evidence of pericardial effusion. Presence of epicardial fat layer. Mitral Valve: The mitral valve was not well visualized. No evidence of mitral valve regurgitation. No evidence of mitral valve stenosis. Tricuspid Valve: The tricuspid valve is not well visualized. Tricuspid valve regurgitation is trivial. No evidence of tricuspid stenosis. Aortic Valve: The aortic valve was not well visualized. Aortic valve regurgitation is not visualized. No aortic stenosis is present. Aortic valve mean gradient measures 3.0 mmHg. Aortic valve peak gradient measures 3.9 mmHg. Aortic valve area, by VTI measures 3.60 cm. Pulmonic Valve: The pulmonic valve was not well visualized. Pulmonic valve regurgitation is not visualized. Aorta: The aortic root and ascending aorta are structurally normal, with no evidence of dilitation. Venous: The inferior vena cava was not well visualized. IAS/Shunts: The interatrial septum was not well visualized.  LEFT VENTRICLE PLAX 2D LVIDd:         4.40 cm   Diastology LV PW:         1.30 cm   LV e' medial:    3.48 cm/s LV IVS:        1.10 cm   LV E/e' medial:  16.3 LVOT diam:     2.30 cm   LV e' lateral:   7.72 cm/s LV SV:         74        LV E/e' lateral: 7.3 LV SV Index:   42 LVOT Area:     4.15 cm  RIGHT VENTRICLE RV Basal diam:  2.70 cm RV S prime:     12.80 cm/s TAPSE (M-mode): 1.4 cm LEFT ATRIUM             Index        RIGHT ATRIUM           Index LA diam:        4.20 cm 2.39 cm/m   RA Area:     12.60 cm LA Vol (A2C):   19.7 ml 11.20 ml/m  RA  Volume:   29.70 ml  16.89 ml/m LA Vol (A4C):   38.9 ml 22.12 ml/m LA Biplane Vol: 30.7 ml 17.46 ml/m  AORTIC VALVE AV Area (Vmax):    3.35 cm AV Area (Vmean):   3.48 cm AV Area (VTI):     3.60 cm AV Vmax:           99.10 cm/s  AV Vmean:          74.900 cm/s AV VTI:            0.204 m AV Peak Grad:      3.9 mmHg AV Mean Grad:      3.0 mmHg LVOT Vmax:         80.00 cm/s LVOT Vmean:        62.800 cm/s LVOT VTI:          0.177 m LVOT/AV VTI ratio: 0.87  AORTA Ao Root diam: 2.70 cm Ao Asc diam:  2.90 cm MITRAL VALVE MV Area (PHT): 3.19 cm    SHUNTS MV Decel Time: 238 msec    Systemic VTI:  0.18 m MV E velocity: 56.70 cm/s  Systemic Diam: 2.30 cm MV A velocity: 96.20 cm/s MV E/A ratio:  0.59 Buford Dresser MD Electronically signed by Buford Dresser MD Signature Date/Time: 06/08/2022/6:43:11 PM    Final    EEG adult  Result Date: 06/08/2022 Lora Havens, MD     06/08/2022  5:14 AM Patient Name: YURANI FETTES MRN: [id number] Epilepsy Attending: Lora Havens Referring Physician/Provider: Derek Jack, MD Date: 06/08/2022 Duration: 21.48 mins Patient history: 81yo F with ams. EEG to evaluate for seizure. Level of alertness: Awake AEDs during EEG study: Ativan, Librium Technical aspects: This EEG study was done with scalp electrodes positioned according to the 10-20 International system of electrode placement. Electrical activity was reviewed with band pass filter of 1-'70Hz'$ , sensitivity of 7 uV/mm, display speed of 57m/sec with a '60Hz'$  notched filter applied as appropriate. EEG data were recorded continuously and digitally stored.  Video monitoring was available and reviewed as appropriate. Description: No posterior dominant rhythm was seen. There was an excessive amount of 15 to 18 Hz beta activity distributed symmetrically and diffusely.  Hyperventilation and photic stimulation were not performed.   ABNORMALITY - Excessive beta, generalized IMPRESSION: This study is within normal limits. The excessive beta activity seen in the background is most likely due to the effect of benzodiazepine and is a benign EEG pattern. No seizures or epileptiform discharges were seen throughout the recording. A normal interictal EEG does  not exclude nor support the diagnosis of epilepsy. PLora Havens  MR BRAIN W WO CONTRAST  Result Date: 06/08/2022 CLINICAL DATA:  Transient ischemic attack.  New onset seizure. EXAM: MRI HEAD WITHOUT AND WITH CONTRAST MRA HEAD WITHOUT CONTRAST MRA NECK WITHOUT AND WITH CONTRAST TECHNIQUE: Multiplanar, multiecho pulse sequences of the brain and surrounding structures were obtained without and with intravenous contrast. Angiographic images of the Circle of Willis were obtained using MRA technique without intravenous contrast. Angiographic images of the neck were obtained using MRA technique without and with intravenous contrast. Carotid stenosis measurements (when applicable) are obtained utilizing NASCET criteria, using the distal internal carotid diameter as the denominator. CONTRAST:  6.5 mL gadopiclenol (Vueway) 0.5 mmol/ml solution COMPARISON:  None Available. FINDINGS: MRI HEAD FINDINGS Brain: No acute infarct, mass effect or extra-axial collection. No acute or chronic hemorrhage. There is multifocal hyperintense T2-weighted signal within the white matter. Generalized volume loss.  The midline structures are normal. There is no abnormal contrast enhancement. Vascular: Major flow voids are preserved. Skull and upper cervical spine: Normal calvarium and skull base. Visualized upper cervical spine and soft tissues are normal. Sinuses/Orbits:No paranasal sinus fluid levels or advanced mucosal thickening. No mastoid or middle ear effusion. Normal orbits. MRA HEAD FINDINGS POSTERIOR CIRCULATION: --Vertebral arteries: Normal --Inferior cerebellar arteries: Normal. --Basilar artery: Normal. --Superior cerebellar arteries: Normal. --Posterior cerebral arteries: Normal. ANTERIOR CIRCULATION: --Intracranial internal carotid arteries: Normal. --Anterior cerebral arteries (ACA): Normal. --Middle cerebral arteries (MCA): At the right MCA bifurcation, there is a laterally projecting outpouching that measures 2 mm. This  may be a small aneurysm or a chronically occluded branch vessel. Otherwise, the middle cerebral arteries are normal. ANATOMIC VARIANTS: None MRA NECK FINDINGS Left dominant vertebral arteries are patent and of normal caliber. Both carotid systems are normal without stenosis. IMPRESSION: 1. No acute intracranial abnormality. 2. 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 3. No emergent large vessel occlusion or high-grade stenosis. 4. A 2 mm laterally projecting outpouching at the right MCA bifurcation may be a small aneurysm or an occluded branch vessel. Given the lack of acute ischemia, this is unlikely to be an acute occlusion. Electronically Signed   By: Ulyses Jarred M.D.   On: 06/08/2022 01:42   MR ANGIO HEAD WO CONTRAST  Result Date: 06/08/2022 CLINICAL DATA:  Transient ischemic attack.  New onset seizure. EXAM: MRI HEAD WITHOUT AND WITH CONTRAST MRA HEAD WITHOUT CONTRAST MRA NECK WITHOUT AND WITH CONTRAST TECHNIQUE: Multiplanar, multiecho pulse sequences of the brain and surrounding structures were obtained without and with intravenous contrast. Angiographic images of the Circle of Willis were obtained using MRA technique without intravenous contrast. Angiographic images of the neck were obtained using MRA technique without and with intravenous contrast. Carotid stenosis measurements (when applicable) are obtained utilizing NASCET criteria, using the distal internal carotid diameter as the denominator. CONTRAST:  6.5 mL gadopiclenol (Vueway) 0.5 mmol/ml solution COMPARISON:  None Available. FINDINGS: MRI HEAD FINDINGS Brain: No acute infarct, mass effect or extra-axial collection. No acute or chronic hemorrhage. There is multifocal hyperintense T2-weighted signal within the white matter. Generalized volume loss. The midline structures are normal. There is no abnormal contrast enhancement. Vascular: Major flow voids are preserved. Skull and upper cervical spine: Normal calvarium and skull base.  Visualized upper cervical spine and soft tissues are normal. Sinuses/Orbits:No paranasal sinus fluid levels or advanced mucosal thickening. No mastoid or middle ear effusion. Normal orbits. MRA HEAD FINDINGS POSTERIOR CIRCULATION: --Vertebral arteries: Normal --Inferior cerebellar arteries: Normal. --Basilar artery: Normal. --Superior cerebellar arteries: Normal. --Posterior cerebral arteries: Normal. ANTERIOR CIRCULATION: --Intracranial internal carotid arteries: Normal. --Anterior cerebral arteries (ACA): Normal. --Middle cerebral arteries (MCA): At the right MCA bifurcation, there is a laterally projecting outpouching that measures 2 mm. This may be a small aneurysm or a chronically occluded branch vessel. Otherwise, the middle cerebral arteries are normal. ANATOMIC VARIANTS: None MRA NECK FINDINGS Left dominant vertebral arteries are patent and of normal caliber. Both carotid systems are normal without stenosis. IMPRESSION: 1. No acute intracranial abnormality. 2. 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 3. No emergent large vessel occlusion or high-grade stenosis. 4. A 2 mm laterally projecting outpouching at the right MCA bifurcation may be a small aneurysm or an occluded branch vessel. Given the lack of acute ischemia, this is unlikely to be an acute occlusion. Electronically Signed   By: Ulyses Jarred M.D.   On: 06/08/2022 01:42   MR  ANGIO NECK W WO CONTRAST  Result Date: 06/08/2022 CLINICAL DATA:  Transient ischemic attack.  New onset seizure. EXAM: MRI HEAD WITHOUT AND WITH CONTRAST MRA HEAD WITHOUT CONTRAST MRA NECK WITHOUT AND WITH CONTRAST TECHNIQUE: Multiplanar, multiecho pulse sequences of the brain and surrounding structures were obtained without and with intravenous contrast. Angiographic images of the Circle of Willis were obtained using MRA technique without intravenous contrast. Angiographic images of the neck were obtained using MRA technique without and with intravenous contrast.  Carotid stenosis measurements (when applicable) are obtained utilizing NASCET criteria, using the distal internal carotid diameter as the denominator. CONTRAST:  6.5 mL gadopiclenol (Vueway) 0.5 mmol/ml solution COMPARISON:  None Available. FINDINGS: MRI HEAD FINDINGS Brain: No acute infarct, mass effect or extra-axial collection. No acute or chronic hemorrhage. There is multifocal hyperintense T2-weighted signal within the white matter. Generalized volume loss. The midline structures are normal. There is no abnormal contrast enhancement. Vascular: Major flow voids are preserved. Skull and upper cervical spine: Normal calvarium and skull base. Visualized upper cervical spine and soft tissues are normal. Sinuses/Orbits:No paranasal sinus fluid levels or advanced mucosal thickening. No mastoid or middle ear effusion. Normal orbits. MRA HEAD FINDINGS POSTERIOR CIRCULATION: --Vertebral arteries: Normal --Inferior cerebellar arteries: Normal. --Basilar artery: Normal. --Superior cerebellar arteries: Normal. --Posterior cerebral arteries: Normal. ANTERIOR CIRCULATION: --Intracranial internal carotid arteries: Normal. --Anterior cerebral arteries (ACA): Normal. --Middle cerebral arteries (MCA): At the right MCA bifurcation, there is a laterally projecting outpouching that measures 2 mm. This may be a small aneurysm or a chronically occluded branch vessel. Otherwise, the middle cerebral arteries are normal. ANATOMIC VARIANTS: None MRA NECK FINDINGS Left dominant vertebral arteries are patent and of normal caliber. Both carotid systems are normal without stenosis. IMPRESSION: 1. No acute intracranial abnormality. 2. 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 3. No emergent large vessel occlusion or high-grade stenosis. 4. A 2 mm laterally projecting outpouching at the right MCA bifurcation may be a small aneurysm or an occluded branch vessel. Given the lack of acute ischemia, this is unlikely to be an acute  occlusion. Electronically Signed   By: Ulyses Jarred M.D.   On: 06/08/2022 01:42   CT HEAD CODE STROKE WO CONTRAST  Result Date: 06/07/2022 CLINICAL DATA:  Code stroke.  Neuro deficit, acute, stroke suspected EXAM: CT HEAD WITHOUT CONTRAST TECHNIQUE: Contiguous axial images were obtained from the base of the skull through the vertex without intravenous contrast. RADIATION DOSE REDUCTION: This exam was performed according to the departmental dose-optimization program which includes automated exposure control, adjustment of the mA and/or kV according to patient size and/or use of iterative reconstruction technique. COMPARISON:  CT head March 10, 2021. FINDINGS: Brain: No evidence of acute large vascular territory infarction, hemorrhage, hydrocephalus, extra-axial collection or mass lesion/mass effect. Mild for age patchy white matter hypoattenuation, nonspecific but compatible with chronic microvascular ischemic disease. Vascular: No hyperdense vessel identified. Calcific intracranial atherosclerosis. Skull: No acute fracture. Sinuses/Orbits: No acute findings. ASPECTS [facility] Stroke Program Early CT Score) total score (0-10 with 10 being normal): 10. IMPRESSION: 1. No evidence of acute intracranial abnormality. 2. ASPECTS is 10. Code stroke imaging results were communicated on 06/07/2022 at 4:52 pm to provider Dr. Quinn Axe Via secure text paging. Electronically Signed   By: Margaretha Sheffield M.D.   On: 06/07/2022 16:52    Assessment/Plan 1. Acute encephalopathy ***  2. Stage 3b chronic kidney disease (HCC) ***  3. Depression with anxiety ***    Family/ staff Communication:   Labs/tests  ordered:

## 2022-06-12 NOTE — Progress Notes (Unsigned)
Provider:  Alain Honey, MD Location:  [place] Room Number: 101-A Place of Service:  SNF (31)  PCP: [name], MD Patient Care Team: [name], MD as PCP - General (Internal Medicine) [name], MD (Ophthalmology) [name], MD (Psychiatry) Mast, Man X, NP as Nurse Practitioner (Nurse Practitioner) [name], MD as Consulting Physician (Orthopedic Surgery)  Extended Emergency Contact Information Primary Emergency Contact: [name], [place] Work Phone: [phone] Relation: Son Secondary Emergency Contact: [name] [place] Phone: [phone] Relation: Sister  Code Status:  Goals of Care: Advanced Directive information    06/04/2022    2:32 PM  Advanced Directives  Does Patient Have a Medical Advance Directive? Yes  Type of Paramedic of [place];Living will;Out of facility DNR (pink MOST or yellow form)  Does patient want to make changes to medical advance directive? No - Patient declined  Copy of Miguel Barrera in Chart? Yes - validated most recent copy scanned in chart (See row information)  Pre-existing out of facility DNR order (yellow form or pink MOST form) Pink MOST/Yellow Form most recent copy in chart - Physician notified to receive inpatient order      No chief complaint on file.   HPI: Patient is a 81 y.o. female seen today for admission to [facility].  The admission.  She was admitted to hospital from September 14 to September 16 diagnosis of altered mental status, probably related to medications.  She was living in memory care and found facedown on the bed and poorly responsive to verbal stimuli and physical stimuli at the time of admission.  EMS was called and she was transported to the hospital.  Prior prior to her leaving unit EKG was done which showed no no acute  changes.  While in hospital she had MRI of the brain and MRA of the head and neck without acute finding EEG showed findings consistent with use of benzodiazepines..  At the time of discharge her encephalopathy had resolved.  She was completely oriented medicines that were just continued include Norco, Librium, and amitriptyline.  Recommendation was to wean her off Ativan and lieu of Klonopin  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, 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   Vitamin D deficiency    Past Surgical History:  Procedure Laterality Date   ABDOMINAL HYSTERECTOMY     ANAL RECTAL MANOMETRY N/A 07/05/2014   Procedure: ANO RECTAL MANOMETRY;  Surgeon: Leighton Ruff, MD;  Location: [facility];  Service: Endoscopy;  Laterality: N/A;   APPENDECTOMY     CAROTID ENDARTERECTOMY  COLONOSCOPY  2008, 2015   TONSILLECTOMY     UPPER GASTROINTESTINAL ENDOSCOPY  2002    reports that she has never smoked. She has never used smokeless tobacco. She reports that she does not drink alcohol and does not use drugs. Social History   Socioeconomic History   Marital status: Divorced    Spouse name: Not on file   Number of children: 2   Years of education: Not on file   Highest education level: Not on file  Occupational History   Occupation: retired    Fish farm manager: RETIRED  Tobacco Use   Smoking status: Never   Smokeless tobacco:  Never  Vaping Use   Vaping Use: Never used  Substance and Sexual Activity   Alcohol use: No    Alcohol/week: 0.0 standard drinks of alcohol   Drug use: No   Sexual activity: Never  Other Topics Concern   Not on file  Social History Narrative   HSG, business college   Married 59-38yr/divorced   2 son-'60; 2 granddaughters   Work: sNetwork engineer travel clerk   [place]   Patient never smoked. Did have second hand smoke exposure childhood and work   Alcohol none   Walks with walker   Social Determinants of Health   Financial Resource Strain: Low Risk  (06/10/2018)   Overall Financial Resource Strain (CARDIA)    Difficulty of Paying Living Expenses: Not hard at all  Food Insecurity: No Food Insecurity (06/10/2018)   Hunger Vital Sign    Worried About Running Out of Food in the Last Year: Never true    Ran Out of Food in the Last Year: Never true  Transportation Needs: No Transportation Needs (06/10/2018)   PRAPARE - THydrologist(Medical): No    Lack of Transportation (Non-Medical): No  Physical Activity: Sufficiently Active (06/10/2018)   Exercise Vital Sign    Days of Exercise per Week: 5 days    Minutes of Exercise per Session: 30 min  Stress: No Stress Concern Present (06/10/2018)   [place]   Feeling of Stress : Not at all  Social Connections: Moderately Isolated (06/10/2018)   Social Connection and Isolation Panel [NHANES]    Frequency of Communication with Friends and Family: Twice a week    Frequency of Social Gatherings with Friends and Family: Twice a week    Attends Religious Services: Never    AMarine scientistor Organizations: No    Attends CArchivistMeetings: Never    Marital Status: Divorced  IHuman resources officerViolence: Not At Risk (06/10/2018)   Humiliation, Afraid, Rape, and Kick questionnaire    Fear  of Current or Ex-Partner: No    Emotionally Abused: No    Physically Abused: No    Sexually Abused: No    Functional Status Survey:    Family History  Problem Relation Age of Onset   Alzheimer's disease Mother    Coronary artery disease Father    Heart attack Father    Heart disease Father    Anuerysm Father        AAA   Ovarian cancer Sister    Lung cancer Brother    Heart disease Brother    Anuerysm Brother        AAA   Diabetes Neg Hx    Colon cancer Neg Hx     Health Maintenance  Topic  Date Due   INFLUENZA VACCINE  04/24/2022   COVID-19 Vaccine (7 - Mixed Product series) 06/12/2022   TETANUS/TDAP  07/15/2029   Pneumonia Vaccine 70+ Years old  Completed   DEXA SCAN  Completed   Zoster Vaccines- Shingrix  Completed   HPV VACCINES  Aged Out    Allergies  Allergen Reactions   Biaxin [Clarithromycin]    Clarithromycin    Doxycycline    Fosamax [Alendronate Sodium]    Geodon [Ziprasidone Hydrochloride]    Lipitor [Atorvastatin Calcium]    Penicillins    Pravachol [Pravastatin Sodium]    Statins    Sulfonamide Derivatives    Zithromax [Azithromycin]    Zocor [Simvastatin]     Outpatient Encounter Medications as of 06/12/2022  Medication Sig   acetaminophen (TYLENOL) 500 MG tablet Take 2 tablets (1,000 mg total) by mouth every 8 (eight) hours as needed for up to 10 days for mild pain, moderate pain, headache or fever.   alum & mag hydroxide-simeth (MAALOX/MYLANTA) 200-200-20 MG/5ML suspension Take 30 mLs by mouth at bedtime.   alum & mag hydroxide-simeth (MAALOX/MYLANTA) 200-200-20 MG/5ML suspension Take 30 mLs by mouth every 4 (four) hours as needed for indigestion or heartburn.   ARTIFICIAL TEAR OP Apply 1 drop to eye 3 (three) times daily.   aspirin 81 MG chewable tablet Chew 81 mg by mouth daily.   Benzocaine (ORAJEL MT) Use as directed in the mouth or throat. 20-0.26-0.15 %; amt: SMALL AMOUNT Three Times A Day - PRN;  mucous membrane Special Instructions:  Apply to right inner mouth sore TID PRN [DX: Other specified disorders of teeth and supporting structures]  Every 4 Hours - PRN;may self-administer   bisacodyl (DULCOLAX) 10 MG suppository Place 10 mg rectally daily as needed for moderate constipation.   bisacodyl (DULCOLAX) 5 MG EC tablet Take 5 mg by mouth daily as needed for moderate constipation.   carbamide peroxide (DEBROX) 6.5 % OTIC solution 5 drops as needed (impaction). 5 drops to affected ear, otic (ear). At bedtime, PRN, Identify cerumen impaction by direct visualization by use of otoscope. Place 5 drops to affected ear QHS x3 nights then irrigate with bulb syringe. If not effective notify MD.   diclofenac Sodium (VOLTAREN) 1 % GEL Apply 2 g topically 2 (two) times daily as needed (pain). apply to lower back area   famotidine (PEPCID) 20 MG tablet Take 20 mg by mouth at bedtime.   fluPHENAZine (PROLIXIN) 1 MG tablet Take 1 mg by mouth at bedtime.   furosemide (LASIX) 20 MG tablet Take 20 mg by mouth daily.   guaiFENesin (MUCINEX) 600 MG 12 hr tablet Take 600 mg by mouth daily as needed (Cold or cogestion).   hydrocortisone valerate cream (WESTCORT) 0.2 % Apply 1 application  topically 2 (two) times daily.   ketoconazole (NIZORAL) 2 % cream Apply 1 application  topically 2 (two) times daily.   ketoconazole (NIZORAL) 2 % shampoo Apply 1 application. topically 2 (two) times a week. Tuesday and Friday   loratadine (CLARITIN) 10 MG tablet Take 10 mg by mouth daily.   LORazepam (ATIVAN) 0.5 MG tablet Take 1 tablet (0.5 mg total) by mouth 2 (two) times daily.   magnesium hydroxide (MILK OF MAGNESIA) 400 MG/5ML suspension Take 30 mLs by mouth daily as needed for mild constipation.   memantine (NAMENDA) 10 MG tablet Take 10 mg by mouth 2 (two) times daily.   Multiple Vitamins-Minerals (SENTRY SENIOR) TABS Take 1 tablet by mouth daily. 500-300-250 mcg [DX: Vitamin  deficiency, unspecified]   OYSTER SHELL PO Take 250 mg by mouth in the morning  and at bedtime.   Polyethylene Glycol 3350 (MIRALAX PO) Take 17 g by mouth See admin instructions. Take 17 gm, oral twice a day, every other day. Miralax 17 gm + 4-8oz fluid mizture BID   saccharomyces boulardii (FLORASTOR) 250 MG capsule Take 250 mg by mouth daily.   spironolactone (ALDACTONE) 25 MG tablet Take 12.5 mg by mouth daily.   triamcinolone cream (KENALOG) 0.1 % Apply 1 application  topically as needed (rash). Apply BID as needed to rash to chest, behind L ear, left posterior neck.   No facility-administered encounter medications on file as of 06/12/2022.    Review of Systems  Constitutional: Negative.   HENT: Negative.    Eyes:  Positive for visual disturbance.  Respiratory: Negative.    Cardiovascular: Negative.   Genitourinary: Negative.   Musculoskeletal:  Positive for gait problem.       Uses walker for ambulation  Hematological: Negative.   Psychiatric/Behavioral:  Positive for agitation.   All other systems reviewed and are negative.   There were no vitals filed for this visit. There is no height or weight on file to calculate BMI. Physical Exam Vitals and nursing note reviewed.  Constitutional:      Appearance: Normal appearance.  HENT:     Head: Normocephalic.  Cardiovascular:     Rate and Rhythm: Normal rate and regular rhythm.     Heart sounds: Normal heart sounds.  Pulmonary:     Effort: Pulmonary effort is normal.     Breath sounds: Normal breath sounds.  Abdominal:     General: Abdomen is flat. Bowel sounds are normal.     Palpations: Abdomen is soft.  Musculoskeletal:        General: Normal range of motion.     Cervical back: Normal range of motion.  Neurological:     General: No focal deficit present.     Mental Status: She is alert and oriented to person, place, and time.  Psychiatric:        Mood and Affect: Mood normal.        Behavior: Behavior normal.        Thought Content: Thought content normal.     Labs reviewed: Basic Metabolic  Panel: Recent Labs    06/07/22 1641 06/07/22 1646 06/08/22 0032 06/09/22 0504  NA 139 138 139 141  K 4.7 5.0 4.8 4.3  CL 101 103 104 107  CO2 25  --  25 22  GLUCOSE 98 97 102* 99  BUN 19 27* 21 14  CREATININE 1.16* 1.00 0.92 0.95  CALCIUM 9.5  --  9.1 9.4  MG  --   --  2.3 2.1  PHOS  --   --  3.8 3.3   Liver Function Tests: Recent Labs    04/26/22 0000 06/07/22 1641 06/08/22 0032 06/09/22 0504  AST 23 29 34  --   ALT '16 18 19  '$ --   ALKPHOS 130* 100 98  --   BILITOT  --  0.3 0.6  --   PROT  --  7.2 6.7  --   ALBUMIN 4.6 4.0 3.8 3.5   No results for input(s): "LIPASE", "AMYLASE" in the last 8760 hours. No results for input(s): "AMMONIA" in the last 8760 hours. CBC: Recent Labs    09/19/21 0000 04/26/22 0000 06/07/22 1641 06/07/22 1646 06/08/22 0032 06/09/22 0504  WBC 10.3 5.9 6.4  --  9.2 7.4  NEUTROABS 6,365.00 3,039.00 3.1  --   --   --   HGB 11.9* 13.6 15.1* 16.3* 12.8 13.4  HCT 37 40 46.3* 48.0* 39.3 39.7  MCV  --   --  96.9  --  95.6 95.4  PLT 465* 353 303  --  333 304   Cardiac Enzymes: No results for input(s): "CKTOTAL", "CKMB", "CKMBINDEX", "TROPONINI" in the last 8760 hours. BNP: Invalid input(s): "POCBNP" Lab Results  Component Value Date   HGBA1C 5.6 06/08/2022   Lab Results  Component Value Date   TSH 1.62 08/15/2021   Lab Results  Component Value Date   VITAMINB12 404 12/31/2010   Lab Results  Component Value Date   FOLATE 12.6 05/11/2010   No results found for: "IRON", "TIBC", "FERRITIN"  Imaging and Procedures obtained prior to SNF admission: ECHOCARDIOGRAM COMPLETE  Result Date: 06/08/2022    ECHOCARDIOGRAM REPORT   Patient Name:   LYSSA HACKLEY Date of Exam: 06/08/2022 Medical Rec #:  [id number]      Height:       65.0 in Accession #:    [id number]     Weight:       151.7 lb Date of Birth:  04/03/1941     BSA:          1.759 m Patient Age:    48 years       BP:           161/48 mmHg Patient Gender: F              HR:            86 bpm. Exam Location:  Inpatient Procedure: 2D Echo, Cardiac Doppler and Color Doppler Indications:    TIA  History:        Patient has no prior history of Echocardiogram examinations.                 Risk Factors:Dyslipidemia and Hypertension. PVD. CKD.  Sonographer:    Clayton Lefort RDCS (AE) Referring Phys: Derek Jack  Sonographer Comments: Technically difficult study due to poor echo windows. IMPRESSIONS  1. Left ventricular ejection fraction, by estimation, is 55 to 60%. The left ventricle has normal function. Left ventricular endocardial border not optimally defined to evaluate regional wall motion. Left ventricular diastolic parameters are indeterminate.  2. Right ventricular systolic function is normal. The right ventricular size is normal.  3. The mitral valve was not well visualized. No evidence of mitral valve regurgitation. No evidence of mitral stenosis.  4. The aortic valve was not well visualized. Aortic valve regurgitation is not visualized. No aortic stenosis is present. Comparison(s): No prior Echocardiogram. Conclusion(s)/Recommendation(s): Very limited imaged due to poor echo windows. Grossly normal RV and LV function. No severe valve disease by Doppler, but valves not well visualized. Consider TEE if indicated to exclude cardioembolic source. FINDINGS  Left Ventricle: Left ventricular ejection fraction, by estimation, is 55 to 60%. The left ventricle has normal function. Left ventricular endocardial border not optimally defined to evaluate regional wall motion. The left ventricular internal cavity size was normal in size. There is borderline left ventricular hypertrophy. Left ventricular diastolic parameters are indeterminate. Right Ventricle: The right ventricular size is normal. Right vetricular wall thickness was not well visualized. Right ventricular systolic function is normal. Left Atrium: Left atrial size was normal in size. Right Atrium: Right atrial size was normal in size.  Pericardium: There is no evidence of pericardial effusion. Presence of epicardial  fat layer. Mitral Valve: The mitral valve was not well visualized. No evidence of mitral valve regurgitation. No evidence of mitral valve stenosis. Tricuspid Valve: The tricuspid valve is not well visualized. Tricuspid valve regurgitation is trivial. No evidence of tricuspid stenosis. Aortic Valve: The aortic valve was not well visualized. Aortic valve regurgitation is not visualized. No aortic stenosis is present. Aortic valve mean gradient measures 3.0 mmHg. Aortic valve peak gradient measures 3.9 mmHg. Aortic valve area, by VTI measures 3.60 cm. Pulmonic Valve: The pulmonic valve was not well visualized. Pulmonic valve regurgitation is not visualized. Aorta: The aortic root and ascending aorta are structurally normal, with no evidence of dilitation. Venous: The inferior vena cava was not well visualized. IAS/Shunts: The interatrial septum was not well visualized.  LEFT VENTRICLE PLAX 2D LVIDd:         4.40 cm   Diastology LV PW:         1.30 cm   LV e' medial:    3.48 cm/s LV IVS:        1.10 cm   LV E/e' medial:  16.3 LVOT diam:     2.30 cm   LV e' lateral:   7.72 cm/s LV SV:         74        LV E/e' lateral: 7.3 LV SV Index:   42 LVOT Area:     4.15 cm  RIGHT VENTRICLE RV Basal diam:  2.70 cm RV S prime:     12.80 cm/s TAPSE (M-mode): 1.4 cm LEFT ATRIUM             Index        RIGHT ATRIUM           Index LA diam:        4.20 cm 2.39 cm/m   RA Area:     12.60 cm LA Vol (A2C):   19.7 ml 11.20 ml/m  RA Volume:   29.70 ml  16.89 ml/m LA Vol (A4C):   38.9 ml 22.12 ml/m LA Biplane Vol: 30.7 ml 17.46 ml/m  AORTIC VALVE AV Area (Vmax):    3.35 cm AV Area (Vmean):   3.48 cm AV Area (VTI):     3.60 cm AV Vmax:           99.10 cm/s AV Vmean:          74.900 cm/s AV VTI:            0.204 m AV Peak Grad:      3.9 mmHg AV Mean Grad:      3.0 mmHg LVOT Vmax:         80.00 cm/s LVOT Vmean:        62.800 cm/s LVOT VTI:          0.177  m LVOT/AV VTI ratio: 0.87  AORTA Ao Root diam: 2.70 cm Ao Asc diam:  2.90 cm MITRAL VALVE MV Area (PHT): 3.19 cm    SHUNTS MV Decel Time: 238 msec    Systemic VTI:  0.18 m MV E velocity: 56.70 cm/s  Systemic Diam: 2.30 cm MV A velocity: 96.20 cm/s MV E/A ratio:  0.59 Buford Dresser MD Electronically signed by Buford Dresser MD Signature Date/Time: 06/08/2022/6:43:11 PM    Final    EEG adult  Result Date: 06/08/2022 Lora Havens, MD     06/08/2022  5:14 AM Patient Name: TONNIA BARDIN MRN: [id number] Epilepsy Attending: Lora Havens Referring Physician/Provider: Derek Jack,  MD Date: 06/08/2022 Duration: 21.48 mins Patient history: 81yo F with ams. EEG to evaluate for seizure. Level of alertness: Awake AEDs during EEG study: Ativan, Librium Technical aspects: This EEG study was done with scalp electrodes positioned according to the 10-20 International system of electrode placement. Electrical activity was reviewed with band pass filter of 1-'70Hz'$ , sensitivity of 7 uV/mm, display speed of 52m/sec with a '60Hz'$  notched filter applied as appropriate. EEG data were recorded continuously and digitally stored.  Video monitoring was available and reviewed as appropriate. Description: No posterior dominant rhythm was seen. There was an excessive amount of 15 to 18 Hz beta activity distributed symmetrically and diffusely.  Hyperventilation and photic stimulation were not performed.   ABNORMALITY - Excessive beta, generalized IMPRESSION: This study is within normal limits. The excessive beta activity seen in the background is most likely due to the effect of benzodiazepine and is a benign EEG pattern. No seizures or epileptiform discharges were seen throughout the recording. A normal interictal EEG does not exclude nor support the diagnosis of epilepsy. PLora Havens  MR BRAIN W WO CONTRAST  Result Date: 06/08/2022 CLINICAL DATA:  Transient ischemic attack.  New onset seizure. EXAM: MRI HEAD  WITHOUT AND WITH CONTRAST MRA HEAD WITHOUT CONTRAST MRA NECK WITHOUT AND WITH CONTRAST TECHNIQUE: Multiplanar, multiecho pulse sequences of the brain and surrounding structures were obtained without and with intravenous contrast. Angiographic images of the Circle of Willis were obtained using MRA technique without intravenous contrast. Angiographic images of the neck were obtained using MRA technique without and with intravenous contrast. Carotid stenosis measurements (when applicable) are obtained utilizing NASCET criteria, using the distal internal carotid diameter as the denominator. CONTRAST:  6.5 mL gadopiclenol (Vueway) 0.5 mmol/ml solution COMPARISON:  None Available. FINDINGS: MRI HEAD FINDINGS Brain: No acute infarct, mass effect or extra-axial collection. No acute or chronic hemorrhage. There is multifocal hyperintense T2-weighted signal within the white matter. Generalized volume loss. The midline structures are normal. There is no abnormal contrast enhancement. Vascular: Major flow voids are preserved. Skull and upper cervical spine: Normal calvarium and skull base. Visualized upper cervical spine and soft tissues are normal. Sinuses/Orbits:No paranasal sinus fluid levels or advanced mucosal thickening. No mastoid or middle ear effusion. Normal orbits. MRA HEAD FINDINGS POSTERIOR CIRCULATION: --Vertebral arteries: Normal --Inferior cerebellar arteries: Normal. --Basilar artery: Normal. --Superior cerebellar arteries: Normal. --Posterior cerebral arteries: Normal. ANTERIOR CIRCULATION: --Intracranial internal carotid arteries: Normal. --Anterior cerebral arteries (ACA): Normal. --Middle cerebral arteries (MCA): At the right MCA bifurcation, there is a laterally projecting outpouching that measures 2 mm. This may be a small aneurysm or a chronically occluded branch vessel. Otherwise, the middle cerebral arteries are normal. ANATOMIC VARIANTS: None MRA NECK FINDINGS Left dominant vertebral arteries are  patent and of normal caliber. Both carotid systems are normal without stenosis. IMPRESSION: 1. No acute intracranial abnormality. 2. 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 3. No emergent large vessel occlusion or high-grade stenosis. 4. A 2 mm laterally projecting outpouching at the right MCA bifurcation may be a small aneurysm or an occluded branch vessel. Given the lack of acute ischemia, this is unlikely to be an acute occlusion. Electronically Signed   By: KUlyses JarredM.D.   On: 06/08/2022 01:42   MR ANGIO HEAD WO CONTRAST  Result Date: 06/08/2022 CLINICAL DATA:  Transient ischemic attack.  New onset seizure. EXAM: MRI HEAD WITHOUT AND WITH CONTRAST MRA HEAD WITHOUT CONTRAST MRA NECK WITHOUT AND WITH CONTRAST TECHNIQUE: Multiplanar, multiecho pulse  sequences of the brain and surrounding structures were obtained without and with intravenous contrast. Angiographic images of the Circle of Willis were obtained using MRA technique without intravenous contrast. Angiographic images of the neck were obtained using MRA technique without and with intravenous contrast. Carotid stenosis measurements (when applicable) are obtained utilizing NASCET criteria, using the distal internal carotid diameter as the denominator. CONTRAST:  6.5 mL gadopiclenol (Vueway) 0.5 mmol/ml solution COMPARISON:  None Available. FINDINGS: MRI HEAD FINDINGS Brain: No acute infarct, mass effect or extra-axial collection. No acute or chronic hemorrhage. There is multifocal hyperintense T2-weighted signal within the white matter. Generalized volume loss. The midline structures are normal. There is no abnormal contrast enhancement. Vascular: Major flow voids are preserved. Skull and upper cervical spine: Normal calvarium and skull base. Visualized upper cervical spine and soft tissues are normal. Sinuses/Orbits:No paranasal sinus fluid levels or advanced mucosal thickening. No mastoid or middle ear effusion. Normal orbits. MRA HEAD  FINDINGS POSTERIOR CIRCULATION: --Vertebral arteries: Normal --Inferior cerebellar arteries: Normal. --Basilar artery: Normal. --Superior cerebellar arteries: Normal. --Posterior cerebral arteries: Normal. ANTERIOR CIRCULATION: --Intracranial internal carotid arteries: Normal. --Anterior cerebral arteries (ACA): Normal. --Middle cerebral arteries (MCA): At the right MCA bifurcation, there is a laterally projecting outpouching that measures 2 mm. This may be a small aneurysm or a chronically occluded branch vessel. Otherwise, the middle cerebral arteries are normal. ANATOMIC VARIANTS: None MRA NECK FINDINGS Left dominant vertebral arteries are patent and of normal caliber. Both carotid systems are normal without stenosis. IMPRESSION: 1. No acute intracranial abnormality. 2. 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 3. No emergent large vessel occlusion or high-grade stenosis. 4. A 2 mm laterally projecting outpouching at the right MCA bifurcation may be a small aneurysm or an occluded branch vessel. Given the lack of acute ischemia, this is unlikely to be an acute occlusion. Electronically Signed   By: Ulyses Jarred M.D.   On: 06/08/2022 01:42   MR ANGIO NECK W WO CONTRAST  Result Date: 06/08/2022 CLINICAL DATA:  Transient ischemic attack.  New onset seizure. EXAM: MRI HEAD WITHOUT AND WITH CONTRAST MRA HEAD WITHOUT CONTRAST MRA NECK WITHOUT AND WITH CONTRAST TECHNIQUE: Multiplanar, multiecho pulse sequences of the brain and surrounding structures were obtained without and with intravenous contrast. Angiographic images of the Circle of Willis were obtained using MRA technique without intravenous contrast. Angiographic images of the neck were obtained using MRA technique without and with intravenous contrast. Carotid stenosis measurements (when applicable) are obtained utilizing NASCET criteria, using the distal internal carotid diameter as the denominator. CONTRAST:  6.5 mL gadopiclenol (Vueway) 0.5  mmol/ml solution COMPARISON:  None Available. FINDINGS: MRI HEAD FINDINGS Brain: No acute infarct, mass effect or extra-axial collection. No acute or chronic hemorrhage. There is multifocal hyperintense T2-weighted signal within the white matter. Generalized volume loss. The midline structures are normal. There is no abnormal contrast enhancement. Vascular: Major flow voids are preserved. Skull and upper cervical spine: Normal calvarium and skull base. Visualized upper cervical spine and soft tissues are normal. Sinuses/Orbits:No paranasal sinus fluid levels or advanced mucosal thickening. No mastoid or middle ear effusion. Normal orbits. MRA HEAD FINDINGS POSTERIOR CIRCULATION: --Vertebral arteries: Normal --Inferior cerebellar arteries: Normal. --Basilar artery: Normal. --Superior cerebellar arteries: Normal. --Posterior cerebral arteries: Normal. ANTERIOR CIRCULATION: --Intracranial internal carotid arteries: Normal. --Anterior cerebral arteries (ACA): Normal. --Middle cerebral arteries (MCA): At the right MCA bifurcation, there is a laterally projecting outpouching that measures 2 mm. This may be a small aneurysm or a chronically occluded branch  vessel. Otherwise, the middle cerebral arteries are normal. ANATOMIC VARIANTS: None MRA NECK FINDINGS Left dominant vertebral arteries are patent and of normal caliber. Both carotid systems are normal without stenosis. IMPRESSION: 1. No acute intracranial abnormality. 2. 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 3. No emergent large vessel occlusion or high-grade stenosis. 4. A 2 mm laterally projecting outpouching at the right MCA bifurcation may be a small aneurysm or an occluded branch vessel. Given the lack of acute ischemia, this is unlikely to be an acute occlusion. Electronically Signed   By: Ulyses Jarred M.D.   On: 06/08/2022 01:42   CT HEAD CODE STROKE WO CONTRAST  Result Date: 06/07/2022 CLINICAL DATA:  Code stroke.  Neuro deficit, acute, stroke  suspected EXAM: CT HEAD WITHOUT CONTRAST TECHNIQUE: Contiguous axial images were obtained from the base of the skull through the vertex without intravenous contrast. RADIATION DOSE REDUCTION: This exam was performed according to the departmental dose-optimization program which includes automated exposure control, adjustment of the mA and/or kV according to patient size and/or use of iterative reconstruction technique. COMPARISON:  CT head March 10, 2021. FINDINGS: Brain: No evidence of acute large vascular territory infarction, hemorrhage, hydrocephalus, extra-axial collection or mass lesion/mass effect. Mild for age patchy white matter hypoattenuation, nonspecific but compatible with chronic microvascular ischemic disease. Vascular: No hyperdense vessel identified. Calcific intracranial atherosclerosis. Skull: No acute fracture. Sinuses/Orbits: No acute findings. ASPECTS [facility] Stroke Program Early CT Score) total score (0-10 with 10 being normal): 10. IMPRESSION: 1. No evidence of acute intracranial abnormality. 2. ASPECTS is 10. Code stroke imaging results were communicated on 06/07/2022 at 4:52 pm to provider Dr. Quinn Axe Via secure text paging. Electronically Signed   By: Margaretha Sheffield M.D.   On: 06/07/2022 16:52    Assessment/Plan 1. Acute encephalopathy Changes in her mental status were likely due to overmedication.  Several of the sedating medications were discontinued and the encephalopathy resolved.  Imaging studies of her brain showed no acute change such as stroke.  2. Stage 3b chronic kidney disease ([place]) She is on diuretic therapy with normal blood pressure.  We will need to watch renal function and lieu of risk of dehydration on diuretics  3. Depression with anxiety Hospitalist have recommended discontinuation of lorazepam in favor of clonazepam.  I am not sure that is necessary as long as we discontinue Librium and Norco    Family/ staff Communication: none  Labs/tests ordered: PER  NP orders  Lillette Boxer. Sabra Heck, [place] [address] [place], [place] Office [phone]

## 2022-06-18 ENCOUNTER — Non-Acute Institutional Stay (SKILLED_NURSING_FACILITY): Payer: Medicare Other | Admitting: Nurse Practitioner

## 2022-06-18 ENCOUNTER — Encounter: Payer: Self-pay | Admitting: Nurse Practitioner

## 2022-06-18 DIAGNOSIS — F0390 Unspecified dementia without behavioral disturbance: Secondary | ICD-10-CM

## 2022-06-18 DIAGNOSIS — F418 Other specified anxiety disorders: Secondary | ICD-10-CM | POA: Diagnosis not present

## 2022-06-18 DIAGNOSIS — I779 Disorder of arteries and arterioles, unspecified: Secondary | ICD-10-CM | POA: Diagnosis not present

## 2022-06-18 DIAGNOSIS — I10 Essential (primary) hypertension: Secondary | ICD-10-CM | POA: Diagnosis not present

## 2022-06-18 DIAGNOSIS — J302 Other seasonal allergic rhinitis: Secondary | ICD-10-CM

## 2022-06-18 DIAGNOSIS — K219 Gastro-esophageal reflux disease without esophagitis: Secondary | ICD-10-CM

## 2022-06-18 DIAGNOSIS — M159 Polyosteoarthritis, unspecified: Secondary | ICD-10-CM

## 2022-06-18 DIAGNOSIS — J3089 Other allergic rhinitis: Secondary | ICD-10-CM

## 2022-06-18 DIAGNOSIS — L309 Dermatitis, unspecified: Secondary | ICD-10-CM

## 2022-06-18 DIAGNOSIS — M8000XA Age-related osteoporosis with current pathological fracture, unspecified site, initial encounter for fracture: Secondary | ICD-10-CM

## 2022-06-18 DIAGNOSIS — K5909 Other constipation: Secondary | ICD-10-CM

## 2022-06-18 NOTE — Assessment & Plan Note (Signed)
blood pressure is mildly elevated, on diuretics. Bun/creat 14/0.95 06/09/22

## 2022-06-18 NOTE — Assessment & Plan Note (Signed)
reported 06/17/22 erythema bilateral cheeks, noted redness R cheek w/o rash, slightly warmth, has prn steroid cream.

## 2022-06-18 NOTE — Assessment & Plan Note (Signed)
supportive care in memory care unit [facility] since Flu 08/2021. CT head was negative for acute process. Taking Memantine.  
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
## 2022-06-18 NOTE — Assessment & Plan Note (Signed)
on prn Dulcolax po/suppository, prn MOM, MiraLax qd/bid alternative.  

## 2022-06-18 NOTE — Assessment & Plan Note (Signed)
on Tylenol, ambulates with walker 

## 2022-06-18 NOTE — Assessment & Plan Note (Signed)
stopped taking Prolia due to cost, ? Allergic to Fosamax.  her son declined DEXA 

## 2022-06-18 NOTE — Assessment & Plan Note (Signed)
stable, on Famotidine, Maalox qd/prn, Hgb 13.4 06/09/22 

## 2022-06-18 NOTE — Assessment & Plan Note (Signed)
takes Claritin, Flonase, Mucinex       

## 2022-06-19 ENCOUNTER — Encounter: Payer: Self-pay | Admitting: Nurse Practitioner

## 2022-06-19 LAB — CBC AND DIFFERENTIAL
HCT: 40 (ref 36–46)
Hemoglobin: 13.3 (ref 12.0–16.0)
Neutrophils Absolute: 5134
Platelets: 362 10*3/uL (ref 150–400)
WBC: 7.2

## 2022-06-19 LAB — BASIC METABOLIC PANEL
BUN: 21 (ref 4–21)
CO2: 24 — AB (ref 13–22)
Chloride: 100 (ref 99–108)
Creatinine: 1 (ref 0.5–1.1)
Glucose: 91
Potassium: 4 mEq/L (ref 3.5–5.1)
Sodium: 138 (ref 137–147)

## 2022-06-19 LAB — HEPATIC FUNCTION PANEL
ALT: 14 U/L (ref 7–35)
AST: 20 (ref 13–35)
Alkaline Phosphatase: 123 (ref 25–125)
Bilirubin, Total: 0.5

## 2022-06-19 LAB — CBC: RBC: 4.25 (ref 3.87–5.11)

## 2022-06-19 LAB — COMPREHENSIVE METABOLIC PANEL
Albumin: 4.2 (ref 3.5–5.0)
Calcium: 9.4 (ref 8.7–10.7)
Globulin: 2.9
eGFR: 54

## 2022-06-19 NOTE — Progress Notes (Unsigned)
Location: [facility]     Place of Service: [facility]   Provider:  Man X Mast NP  [name], MD  Patient Care Team: [name], MD as PCP - General (Internal Medicine) [name], MD (Ophthalmology) [name], MD (Psychiatry) Mast, Man X, NP as Nurse Practitioner (Nurse Practitioner) [name], MD as Consulting Physician (Orthopedic Surgery)  Extended Emergency Contact Information Primary Emergency Contact: [name], [facility] of [place] Work Phone: [phone] Relation: Son Secondary Emergency Contact: [name] [place] of [place] Phone: [phone] Relation: Sister  Code Status: DNR   Managed Care  Goals of care: Advanced Directive information    06/18/2022   11:07 AM  Advanced Directives  Type of Advance Directive Healthcare Power of [place];Living will;Out of facility DNR (pink MOST or yellow form)  Copy of [place] in Chart? Yes - validated most recent copy scanned in chart (See row information)  Pre-existing out of facility DNR order (yellow form or pink MOST form) Pink Most/Yellow Form available - Physician notified to receive inpatient order;Yellow form placed in chart (order not valid for inpatient use)     No chief complaint on file.   HPI:  Pt is a 81 y.o. female seen today for medical management of chronic diseases.     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, 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   Vitamin D deficiency    Past Surgical History:  Procedure Laterality Date   ABDOMINAL HYSTERECTOMY     ANAL RECTAL MANOMETRY N/A 07/05/2014   Procedure: ANO RECTAL MANOMETRY;  Surgeon: Leighton Ruff, MD;  Location: [facility];  Service: Endoscopy;  Laterality: N/A;   APPENDECTOMY     CAROTID ENDARTERECTOMY     COLONOSCOPY  2008, 2015   TONSILLECTOMY     UPPER GASTROINTESTINAL ENDOSCOPY  2002    Allergies  Allergen Reactions   Biaxin [Clarithromycin]    Clarithromycin    Doxycycline    Fosamax [Alendronate Sodium]    Geodon [Ziprasidone Hydrochloride]    Lipitor [Atorvastatin Calcium]    Penicillins    Pravachol [Pravastatin Sodium]    Statins    Sulfonamide Derivatives    Zithromax [Azithromycin]    Zocor [Simvastatin]     Allergies as of 06/18/2022       Reactions   Biaxin [clarithromycin]    Clarithromycin    Doxycycline    Fosamax [alendronate Sodium]    Geodon [ziprasidone Hydrochloride]    Lipitor [atorvastatin Calcium]    Penicillins    Pravachol [pravastatin Sodium]    Statins    Sulfonamide Derivatives  Zithromax [azithromycin]    Zocor [simvastatin]         Medication List        Accurate as of June 18, 2022 11:59 PM. If you have any questions, ask your nurse or doctor.          STOP taking these medications    acetaminophen 500 MG tablet Commonly known as: TYLENOL Stopped by: Man X Mast, NP       TAKE these medications    alum & mag hydroxide-simeth 200-200-20 MG/5ML suspension Commonly known as: MAALOX/MYLANTA Take 30 mLs by mouth every 4 (four) hours as needed for indigestion or heartburn. What changed: Another medication with the same name was removed. Continue taking this medication, and follow the directions you see here. Changed by: Man X Mast, NP   amitriptyline 25 MG  tablet Commonly known as: ELAVIL Take 25 mg by mouth daily.   ARTIFICIAL TEAR OP Apply 1 drop to eye 3 (three) times daily.   aspirin 81 MG chewable tablet Chew 81 mg by mouth daily.   bisacodyl 10 MG suppository Commonly known as: DULCOLAX Place 10 mg rectally daily as needed for moderate constipation.   bisacodyl 5 MG EC tablet Commonly known as: DULCOLAX Take 5 mg by mouth daily as needed for moderate constipation.   carbamide peroxide 6.5 % OTIC solution Commonly known as: DEBROX 5 drops as needed (impaction). 5 drops to affected ear, otic (ear). At bedtime, PRN, Identify cerumen impaction by direct visualization by use of otoscope. Place 5 drops to affected ear QHS x3 nights then irrigate with bulb syringe. If not effective notify MD.   clonazePAM 0.25 MG disintegrating tablet Commonly known as: KLONOPIN Take 0.25 mg by mouth 2 (two) times daily. Morning and evening PRN   diclofenac Sodium 1 % Gel Commonly known as: VOLTAREN Apply 2 g topically 2 (two) times daily as needed (pain). apply to lower back area   famotidine 20 MG tablet Commonly known as: PEPCID Take 20 mg by mouth at bedtime.   fluPHENAZine 1 MG tablet Commonly known as: PROLIXIN Take 1 mg by mouth at bedtime.   furosemide 20 MG tablet Commonly known as: LASIX Take 20 mg by mouth daily.   guaiFENesin 600 MG 12 hr tablet Commonly known as: MUCINEX Take 600 mg by mouth daily as needed (Cold or cogestion).   hydrocortisone valerate cream 0.2 % Commonly known as: WESTCORT Apply 1 application  topically 2 (two) times daily. APPLY TO RASH ON FACE AND NECK TWICE DAILY AS NEEDED   ketoconazole 2 % shampoo Commonly known as: NIZORAL Apply 1 application. topically 2 (two) times a week. Tuesday and Friday   ketoconazole 2 % cream Commonly known as: NIZORAL Apply 1 application  topically 2 (two) times daily. APPLY TO RASH ON FACE AND NECK TWICE DAILY AS NEEDED   loratadine 10 MG tablet Commonly known  as: CLARITIN Take 10 mg by mouth daily.   LORazepam 0.5 MG tablet Commonly known as: ATIVAN Take 1 tablet (0.5 mg total) by mouth 2 (two) times daily.   magnesium hydroxide 400 MG/5ML suspension Commonly known as: MILK OF MAGNESIA Take 30 mLs by mouth daily as needed for mild constipation.   memantine 10 MG tablet Commonly known as: NAMENDA Take 10 mg by mouth 2 (two) times daily.   MIRALAX PO Take 17 g by mouth See admin instructions. Take 17 gm, oral twice a day, every other day. Miralax 17 gm + 4-8oz fluid mizture BID  ORAJEL MT Use as directed in the mouth or throat. 20-0.26-0.15 %; amt: SMALL AMOUNT Three Times A Day - PRN;  mucous membrane Special Instructions: Apply to right inner mouth sore TID PRN [DX: Other specified disorders of teeth and supporting structures]  Every 4 Hours - PRN;may self-administer   OYSTER SHELL PO Take 250 mg by mouth in the morning and at bedtime.   saccharomyces boulardii 250 MG capsule Commonly known as: FLORASTOR Take 250 mg by mouth daily.   Sentry Senior Tabs Take 1 tablet by mouth daily. 500-300-250 mcg [DX: Vitamin deficiency, unspecified]   spironolactone 25 MG tablet Commonly known as: ALDACTONE Take 12.5 mg by mouth daily.   triamcinolone cream 0.1 % Commonly known as: KENALOG Apply 1 application  topically as needed (rash). Apply BID as needed to rash to chest, behind L ear, left posterior neck.        Review of Systems  Immunization History  Administered Date(s) Administered   Influenza Split 07/23/2013   Influenza Whole 07/05/2009, 06/26/2018   Influenza-Unspecified 07/29/2014, 06/14/2015, 07/11/2016, 07/15/2017, 06/27/2019, 07/05/2020, 07/13/2021   Moderna SARS-COV2 Booster Vaccination 02/09/2022   Moderna Sars-Covid-2 Vaccination 09/26/2019, 10/24/2019, 08/02/2020   PFIZER(Purple Top)SARS-COV-2 Vaccination 09/26/2019   PPD Test 06/17/2013   Pneumococcal Conjugate-13 07/22/2017   Pneumococcal  Polysaccharide-23 05/05/2009   Td 05/05/2009, 07/16/2019   Unspecified SARS-COV-2 Vaccination 02/21/2021, 06/13/2021   Zoster Recombinat (Shingrix) 09/27/2021, 12/05/2021   Zoster, Live 01/09/2011   Pertinent  Health Maintenance Due  Topic Date Due   INFLUENZA VACCINE  04/24/2022   DEXA SCAN  Completed      06/07/2022    5:37 PM 06/08/2022    4:25 AM 06/08/2022    5:57 PM 06/08/2022   11:00 PM 06/09/2022   10:00 AM  Fall Risk  Patient Fall Risk Level Moderate fall risk High fall risk High fall risk High fall risk High fall risk   Functional Status Survey:    There were no vitals filed for this visit. There is no height or weight on file to calculate BMI. Physical Exam  Labs reviewed: Recent Labs    06/07/22 1641 06/07/22 1646 06/08/22 0032 06/09/22 0504  NA 139 138 139 141  K 4.7 5.0 4.8 4.3  CL 101 103 104 107  CO2 25  --  25 22  GLUCOSE 98 97 102* 99  BUN 19 27* 21 14  CREATININE 1.16* 1.00 0.92 0.95  CALCIUM 9.5  --  9.1 9.4  MG  --   --  2.3 2.1  PHOS  --   --  3.8 3.3   Recent Labs    04/26/22 0000 06/07/22 1641 06/08/22 0032 06/09/22 0504  AST 23 29 34  --   ALT '16 18 19  '$ --   ALKPHOS 130* 100 98  --   BILITOT  --  0.3 0.6  --   PROT  --  7.2 6.7  --   ALBUMIN 4.6 4.0 3.8 3.5   Recent Labs    09/19/21 0000 04/26/22 0000 06/07/22 1641 06/07/22 1646 06/08/22 0032 06/09/22 0504  WBC 10.3 5.9 6.4  --  9.2 7.4  NEUTROABS 6,365.00 3,039.00 3.1  --   --   --   HGB 11.9* 13.6 15.1* 16.3* 12.8 13.4  HCT 37 40 46.3* 48.0* 39.3 39.7  MCV  --   --  96.9  --  95.6 95.4  PLT 465* 353 303  --  333 304   Lab Results  Component Value Date  TSH 1.62 08/15/2021   Lab Results  Component Value Date   HGBA1C 5.6 06/08/2022   Lab Results  Component Value Date   CHOL 201 (H) 06/08/2022   HDL 71 06/08/2022   LDLCALC 113 (H) 06/08/2022   LDLDIRECT 190.0 03/24/2012   TRIG 84 06/08/2022   CHOLHDL 2.8 06/08/2022    Significant Diagnostic Results in  last 30 days:  ECHOCARDIOGRAM COMPLETE  Result Date: 06/08/2022    ECHOCARDIOGRAM REPORT   Patient Name:   ZIKERIA KEOUGH Date of Exam: 06/08/2022 Medical Rec #:  [id number]      Height:       65.0 in Accession #:    [id number]     Weight:       151.7 lb Date of Birth:  Dec 22, 1940     BSA:          1.759 m Patient Age:    60 years       BP:           161/48 mmHg Patient Gender: F              HR:           86 bpm. Exam Location:  Inpatient Procedure: 2D Echo, Cardiac Doppler and Color Doppler Indications:    TIA  History:        Patient has no prior history of Echocardiogram examinations.                 Risk Factors:Dyslipidemia and Hypertension. PVD. CKD.  Sonographer:    Clayton Lefort RDCS (AE) Referring Phys: Derek Jack  Sonographer Comments: Technically difficult study due to poor echo windows. IMPRESSIONS  1. Left ventricular ejection fraction, by estimation, is 55 to 60%. The left ventricle has normal function. Left ventricular endocardial border not optimally defined to evaluate regional wall motion. Left ventricular diastolic parameters are indeterminate.  2. Right ventricular systolic function is normal. The right ventricular size is normal.  3. The mitral valve was not well visualized. No evidence of mitral valve regurgitation. No evidence of mitral stenosis.  4. The aortic valve was not well visualized. Aortic valve regurgitation is not visualized. No aortic stenosis is present. Comparison(s): No prior Echocardiogram. Conclusion(s)/Recommendation(s): Very limited imaged due to poor echo windows. Grossly normal RV and LV function. No severe valve disease by Doppler, but valves not well visualized. Consider TEE if indicated to exclude cardioembolic source. FINDINGS  Left Ventricle: Left ventricular ejection fraction, by estimation, is 55 to 60%. The left ventricle has normal function. Left ventricular endocardial border not optimally defined to evaluate regional wall motion. The left ventricular  internal cavity size was normal in size. There is borderline left ventricular hypertrophy. Left ventricular diastolic parameters are indeterminate. Right Ventricle: The right ventricular size is normal. Right vetricular wall thickness was not well visualized. Right ventricular systolic function is normal. Left Atrium: Left atrial size was normal in size. Right Atrium: Right atrial size was normal in size. Pericardium: There is no evidence of pericardial effusion. Presence of epicardial fat layer. Mitral Valve: The mitral valve was not well visualized. No evidence of mitral valve regurgitation. No evidence of mitral valve stenosis. Tricuspid Valve: The tricuspid valve is not well visualized. Tricuspid valve regurgitation is trivial. No evidence of tricuspid stenosis. Aortic Valve: The aortic valve was not well visualized. Aortic valve regurgitation is not visualized. No aortic stenosis is present. Aortic valve mean gradient measures 3.0 mmHg. Aortic valve peak gradient measures 3.9 mmHg. Aortic valve  area, by VTI measures 3.60 cm. Pulmonic Valve: The pulmonic valve was not well visualized. Pulmonic valve regurgitation is not visualized. Aorta: The aortic root and ascending aorta are structurally normal, with no evidence of dilitation. Venous: The inferior vena cava was not well visualized. IAS/Shunts: The interatrial septum was not well visualized.  LEFT VENTRICLE PLAX 2D LVIDd:         4.40 cm   Diastology LV PW:         1.30 cm   LV e' medial:    3.48 cm/s LV IVS:        1.10 cm   LV E/e' medial:  16.3 LVOT diam:     2.30 cm   LV e' lateral:   7.72 cm/s LV SV:         74        LV E/e' lateral: 7.3 LV SV Index:   42 LVOT Area:     4.15 cm  RIGHT VENTRICLE RV Basal diam:  2.70 cm RV S prime:     12.80 cm/s TAPSE (M-mode): 1.4 cm LEFT ATRIUM             Index        RIGHT ATRIUM           Index LA diam:        4.20 cm 2.39 cm/m   RA Area:     12.60 cm LA Vol (A2C):   19.7 ml 11.20 ml/m  RA Volume:   29.70 ml   16.89 ml/m LA Vol (A4C):   38.9 ml 22.12 ml/m LA Biplane Vol: 30.7 ml 17.46 ml/m  AORTIC VALVE AV Area (Vmax):    3.35 cm AV Area (Vmean):   3.48 cm AV Area (VTI):     3.60 cm AV Vmax:           99.10 cm/s AV Vmean:          74.900 cm/s AV VTI:            0.204 m AV Peak Grad:      3.9 mmHg AV Mean Grad:      3.0 mmHg LVOT Vmax:         80.00 cm/s LVOT Vmean:        62.800 cm/s LVOT VTI:          0.177 m LVOT/AV VTI ratio: 0.87  AORTA Ao Root diam: 2.70 cm Ao Asc diam:  2.90 cm MITRAL VALVE MV Area (PHT): 3.19 cm    SHUNTS MV Decel Time: 238 msec    Systemic VTI:  0.18 m MV E velocity: 56.70 cm/s  Systemic Diam: 2.30 cm MV A velocity: 96.20 cm/s MV E/A ratio:  0.59 Buford Dresser MD Electronically signed by Buford Dresser MD Signature Date/Time: 06/08/2022/6:43:11 PM    Final    EEG adult  Result Date: 06/08/2022 Lora Havens, MD     06/08/2022  5:14 AM Patient Name: MARINE LEZOTTE MRN: [id number] Epilepsy Attending: Lora Havens Referring Physician/Provider: Derek Jack, MD Date: 06/08/2022 Duration: 21.48 mins Patient history: 81yo F with ams. EEG to evaluate for seizure. Level of alertness: Awake AEDs during EEG study: Ativan, Librium Technical aspects: This EEG study was done with scalp electrodes positioned according to the 10-20 International system of electrode placement. Electrical activity was reviewed with band pass filter of 1-'70Hz'$ , sensitivity of 7 uV/mm, display speed of 56m/sec with a '60Hz'$  notched filter applied as appropriate. EEG data were recorded continuously  and digitally stored.  Video monitoring was available and reviewed as appropriate. Description: No posterior dominant rhythm was seen. There was an excessive amount of 15 to 18 Hz beta activity distributed symmetrically and diffusely.  Hyperventilation and photic stimulation were not performed.   ABNORMALITY - Excessive beta, generalized IMPRESSION: This study is within normal limits. The excessive beta  activity seen in the background is most likely due to the effect of benzodiazepine and is a benign EEG pattern. No seizures or epileptiform discharges were seen throughout the recording. A normal interictal EEG does not exclude nor support the diagnosis of epilepsy. Lora Havens   MR BRAIN W WO CONTRAST  Result Date: 06/08/2022 CLINICAL DATA:  Transient ischemic attack.  New onset seizure. EXAM: MRI HEAD WITHOUT AND WITH CONTRAST MRA HEAD WITHOUT CONTRAST MRA NECK WITHOUT AND WITH CONTRAST TECHNIQUE: Multiplanar, multiecho pulse sequences of the brain and surrounding structures were obtained without and with intravenous contrast. Angiographic images of the Circle of Willis were obtained using MRA technique without intravenous contrast. Angiographic images of the neck were obtained using MRA technique without and with intravenous contrast. Carotid stenosis measurements (when applicable) are obtained utilizing NASCET criteria, using the distal internal carotid diameter as the denominator. CONTRAST:  6.5 mL gadopiclenol (Vueway) 0.5 mmol/ml solution COMPARISON:  None Available. FINDINGS: MRI HEAD FINDINGS Brain: No acute infarct, mass effect or extra-axial collection. No acute or chronic hemorrhage. There is multifocal hyperintense T2-weighted signal within the white matter. Generalized volume loss. The midline structures are normal. There is no abnormal contrast enhancement. Vascular: Major flow voids are preserved. Skull and upper cervical spine: Normal calvarium and skull base. Visualized upper cervical spine and soft tissues are normal. Sinuses/Orbits:No paranasal sinus fluid levels or advanced mucosal thickening. No mastoid or middle ear effusion. Normal orbits. MRA HEAD FINDINGS POSTERIOR CIRCULATION: --Vertebral arteries: Normal --Inferior cerebellar arteries: Normal. --Basilar artery: Normal. --Superior cerebellar arteries: Normal. --Posterior cerebral arteries: Normal. ANTERIOR CIRCULATION:  --Intracranial internal carotid arteries: Normal. --Anterior cerebral arteries (ACA): Normal. --Middle cerebral arteries (MCA): At the right MCA bifurcation, there is a laterally projecting outpouching that measures 2 mm. This may be a small aneurysm or a chronically occluded branch vessel. Otherwise, the middle cerebral arteries are normal. ANATOMIC VARIANTS: None MRA NECK FINDINGS Left dominant vertebral arteries are patent and of normal caliber. Both carotid systems are normal without stenosis. IMPRESSION: 1. No acute intracranial abnormality. 2. 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 3. No emergent large vessel occlusion or high-grade stenosis. 4. A 2 mm laterally projecting outpouching at the right MCA bifurcation may be a small aneurysm or an occluded branch vessel. Given the lack of acute ischemia, this is unlikely to be an acute occlusion. Electronically Signed   By: Ulyses Jarred M.D.   On: 06/08/2022 01:42   MR ANGIO HEAD WO CONTRAST  Result Date: 06/08/2022 CLINICAL DATA:  Transient ischemic attack.  New onset seizure. EXAM: MRI HEAD WITHOUT AND WITH CONTRAST MRA HEAD WITHOUT CONTRAST MRA NECK WITHOUT AND WITH CONTRAST TECHNIQUE: Multiplanar, multiecho pulse sequences of the brain and surrounding structures were obtained without and with intravenous contrast. Angiographic images of the Circle of Willis were obtained using MRA technique without intravenous contrast. Angiographic images of the neck were obtained using MRA technique without and with intravenous contrast. Carotid stenosis measurements (when applicable) are obtained utilizing NASCET criteria, using the distal internal carotid diameter as the denominator. CONTRAST:  6.5 mL gadopiclenol (Vueway) 0.5 mmol/ml solution COMPARISON:  None Available. FINDINGS: MRI  HEAD FINDINGS Brain: No acute infarct, mass effect or extra-axial collection. No acute or chronic hemorrhage. There is multifocal hyperintense T2-weighted signal within the  white matter. Generalized volume loss. The midline structures are normal. There is no abnormal contrast enhancement. Vascular: Major flow voids are preserved. Skull and upper cervical spine: Normal calvarium and skull base. Visualized upper cervical spine and soft tissues are normal. Sinuses/Orbits:No paranasal sinus fluid levels or advanced mucosal thickening. No mastoid or middle ear effusion. Normal orbits. MRA HEAD FINDINGS POSTERIOR CIRCULATION: --Vertebral arteries: Normal --Inferior cerebellar arteries: Normal. --Basilar artery: Normal. --Superior cerebellar arteries: Normal. --Posterior cerebral arteries: Normal. ANTERIOR CIRCULATION: --Intracranial internal carotid arteries: Normal. --Anterior cerebral arteries (ACA): Normal. --Middle cerebral arteries (MCA): At the right MCA bifurcation, there is a laterally projecting outpouching that measures 2 mm. This may be a small aneurysm or a chronically occluded branch vessel. Otherwise, the middle cerebral arteries are normal. ANATOMIC VARIANTS: None MRA NECK FINDINGS Left dominant vertebral arteries are patent and of normal caliber. Both carotid systems are normal without stenosis. IMPRESSION: 1. No acute intracranial abnormality. 2. 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 3. No emergent large vessel occlusion or high-grade stenosis. 4. A 2 mm laterally projecting outpouching at the right MCA bifurcation may be a small aneurysm or an occluded branch vessel. Given the lack of acute ischemia, this is unlikely to be an acute occlusion. Electronically Signed   By: Ulyses Jarred M.D.   On: 06/08/2022 01:42   MR ANGIO NECK W WO CONTRAST  Result Date: 06/08/2022 CLINICAL DATA:  Transient ischemic attack.  New onset seizure. EXAM: MRI HEAD WITHOUT AND WITH CONTRAST MRA HEAD WITHOUT CONTRAST MRA NECK WITHOUT AND WITH CONTRAST TECHNIQUE: Multiplanar, multiecho pulse sequences of the brain and surrounding structures were obtained without and with intravenous  contrast. Angiographic images of the Circle of Willis were obtained using MRA technique without intravenous contrast. Angiographic images of the neck were obtained using MRA technique without and with intravenous contrast. Carotid stenosis measurements (when applicable) are obtained utilizing NASCET criteria, using the distal internal carotid diameter as the denominator. CONTRAST:  6.5 mL gadopiclenol (Vueway) 0.5 mmol/ml solution COMPARISON:  None Available. FINDINGS: MRI HEAD FINDINGS Brain: No acute infarct, mass effect or extra-axial collection. No acute or chronic hemorrhage. There is multifocal hyperintense T2-weighted signal within the white matter. Generalized volume loss. The midline structures are normal. There is no abnormal contrast enhancement. Vascular: Major flow voids are preserved. Skull and upper cervical spine: Normal calvarium and skull base. Visualized upper cervical spine and soft tissues are normal. Sinuses/Orbits:No paranasal sinus fluid levels or advanced mucosal thickening. No mastoid or middle ear effusion. Normal orbits. MRA HEAD FINDINGS POSTERIOR CIRCULATION: --Vertebral arteries: Normal --Inferior cerebellar arteries: Normal. --Basilar artery: Normal. --Superior cerebellar arteries: Normal. --Posterior cerebral arteries: Normal. ANTERIOR CIRCULATION: --Intracranial internal carotid arteries: Normal. --Anterior cerebral arteries (ACA): Normal. --Middle cerebral arteries (MCA): At the right MCA bifurcation, there is a laterally projecting outpouching that measures 2 mm. This may be a small aneurysm or a chronically occluded branch vessel. Otherwise, the middle cerebral arteries are normal. ANATOMIC VARIANTS: None MRA NECK FINDINGS Left dominant vertebral arteries are patent and of normal caliber. Both carotid systems are normal without stenosis. IMPRESSION: 1. No acute intracranial abnormality. 2. 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 3. No emergent large vessel occlusion or  high-grade stenosis. 4. A 2 mm laterally projecting outpouching at the right MCA bifurcation may be a small aneurysm or an occluded branch vessel.  Given the lack of acute ischemia, this is unlikely to be an acute occlusion. Electronically Signed   By: Ulyses Jarred M.D.   On: 06/08/2022 01:42   CT HEAD CODE STROKE WO CONTRAST  Result Date: 06/07/2022 CLINICAL DATA:  Code stroke.  Neuro deficit, acute, stroke suspected EXAM: CT HEAD WITHOUT CONTRAST TECHNIQUE: Contiguous axial images were obtained from the base of the skull through the vertex without intravenous contrast. RADIATION DOSE REDUCTION: This exam was performed according to the departmental dose-optimization program which includes automated exposure control, adjustment of the mA and/or kV according to patient size and/or use of iterative reconstruction technique. COMPARISON:  CT head March 10, 2021. FINDINGS: Brain: No evidence of acute large vascular territory infarction, hemorrhage, hydrocephalus, extra-axial collection or mass lesion/mass effect. Mild for age patchy white matter hypoattenuation, nonspecific but compatible with chronic microvascular ischemic disease. Vascular: No hyperdense vessel identified. Calcific intracranial atherosclerosis. Skull: No acute fracture. Sinuses/Orbits: No acute findings. ASPECTS [facility] Stroke Program Early CT Score) total score (0-10 with 10 being normal): 10. IMPRESSION: 1. No evidence of acute intracranial abnormality. 2. ASPECTS is 10. Code stroke imaging results were communicated on 06/07/2022 at 4:52 pm to provider Dr. Quinn Axe Via secure text paging. Electronically Signed   By: Margaretha Sheffield M.D.   On: 06/07/2022 16:52    Assessment/Plan There are no diagnoses linked to this encounter.   Family/ staff Communication:   Labs/tests ordered:

## 2022-06-21 NOTE — Progress Notes (Signed)
This encounter was created in error - please disregard.

## 2022-07-02 ENCOUNTER — Non-Acute Institutional Stay (SKILLED_NURSING_FACILITY): Payer: Medicare Other | Admitting: Nurse Practitioner

## 2022-07-02 ENCOUNTER — Encounter: Payer: Self-pay | Admitting: Nurse Practitioner

## 2022-07-02 DIAGNOSIS — J3089 Other allergic rhinitis: Secondary | ICD-10-CM

## 2022-07-02 DIAGNOSIS — M159 Polyosteoarthritis, unspecified: Secondary | ICD-10-CM

## 2022-07-02 DIAGNOSIS — J302 Other seasonal allergic rhinitis: Secondary | ICD-10-CM

## 2022-07-02 DIAGNOSIS — F323 Major depressive disorder, single episode, severe with psychotic features: Secondary | ICD-10-CM | POA: Diagnosis not present

## 2022-07-02 DIAGNOSIS — K5909 Other constipation: Secondary | ICD-10-CM

## 2022-07-02 DIAGNOSIS — K219 Gastro-esophageal reflux disease without esophagitis: Secondary | ICD-10-CM

## 2022-07-02 DIAGNOSIS — M8000XA Age-related osteoporosis with current pathological fracture, unspecified site, initial encounter for fracture: Secondary | ICD-10-CM

## 2022-07-02 DIAGNOSIS — F0390 Unspecified dementia without behavioral disturbance: Secondary | ICD-10-CM | POA: Diagnosis not present

## 2022-07-02 DIAGNOSIS — I10 Essential (primary) hypertension: Secondary | ICD-10-CM

## 2022-07-02 NOTE — Assessment & Plan Note (Signed)
stable, on Famotidine, Maalox qd/prn, Hgb 13.4 06/09/22 

## 2022-07-02 NOTE — Assessment & Plan Note (Signed)
blood pressure is controlled on diuretics. Bun/creat 14/0.95 06/09/22 

## 2022-07-02 NOTE — Progress Notes (Unsigned)
Location:   SNF [place] Room Number: 086 PYPPJ of Service:  SNF (31) Provider: Lennie Odor Jolan Mealor NP  [name], MD  Patient Care Team: [name], MD as PCP - General (Internal Medicine) [name], MD (Ophthalmology) [name], MD (Psychiatry) Maika Mcelveen X, NP as Nurse Practitioner (Nurse Practitioner) [name], MD as Consulting Physician (Orthopedic Surgery)  Extended Emergency Contact Information Primary Emergency Contact: [name], [place] Work Phone: [phone] Relation: Son Secondary Emergency Contact: [name] [place] Phone: [phone] Relation: Sister  Code Status:  DNR Goals of care: Advanced Directive information    06/18/2022   11:07 AM  Advanced Directives  Type of Advance Directive Healthcare Power of [place];Living will;Out of facility DNR (pink MOST or yellow form)  Copy of [place] in Chart? Yes - validated most recent copy scanned in chart (See row information)  Pre-existing out of facility DNR order (yellow form or pink MOST form) Pink Most/Yellow Form available - Physician notified to receive inpatient order;Yellow form placed in chart (order not valid for inpatient use)     Chief Complaint  Patient presents with   Medical Management of Chronic Issues    HPI:  Pt is a 81 y.o. female seen today for medical management of chronic diseases.      HTN, blood pressure is controlled on diuretics. Bun/creat 14/0.95 06/09/22             OA on Tylenol, ambulates with walker.              GERD, stable, on Famotidine, Maalox qd/prn, Hgb 13.4 06/09/22             Depression/anxiety/hallucination, stabilizing,  resumed Amitriptyline at '50mg'$  qd-failed GDR, off Librium, continued Fluphenazine, on Clonazepam, TSH 1.62 08/15/21             Dementia, supportive care in memory care unit [facility] since Flu 08/2021. CT head was negative for acute process.  Taking Memantine.              Chronic constipation, on prn Dulcolax po/suppository, prn MOM, MiraLax qd/bid alternative.              Allergic rhinitis, takes Claritin, Flonase, Mucinex        OP, stopped taking Prolia due to cost, ? Allergic to Fosamax.  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, 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   Vitamin D deficiency    Past Surgical History:  Procedure Laterality Date   ABDOMINAL HYSTERECTOMY     ANAL RECTAL MANOMETRY N/A 07/05/2014  Procedure: ANO RECTAL MANOMETRY;  Surgeon: Leighton Ruff, MD;  Location: [facility];  Service: Endoscopy;  Laterality: N/A;   APPENDECTOMY     CAROTID ENDARTERECTOMY     COLONOSCOPY  2008, 2015   TONSILLECTOMY     UPPER GASTROINTESTINAL ENDOSCOPY  2002    Allergies  Allergen Reactions   Biaxin [Clarithromycin]    Clarithromycin    Doxycycline    Fosamax [Alendronate Sodium]    Geodon [Ziprasidone Hydrochloride]    Lipitor [Atorvastatin Calcium]    Penicillins    Pravachol [Pravastatin Sodium]    Statins    Sulfonamide Derivatives    Zithromax [Azithromycin]    Zocor [Simvastatin]     Allergies as of 07/02/2022       Reactions   Biaxin [clarithromycin]    Clarithromycin    Doxycycline    Fosamax [alendronate Sodium]    Geodon [ziprasidone Hydrochloride]    Lipitor [atorvastatin Calcium]     Penicillins    Pravachol [pravastatin Sodium]    Statins    Sulfonamide Derivatives    Zithromax [azithromycin]    Zocor [simvastatin]         Medication List        Accurate as of July 02, 2022 11:59 PM. If you have any questions, ask your nurse or doctor.          alum & mag hydroxide-simeth 200-200-20 MG/5ML suspension Commonly known as: MAALOX/MYLANTA Take 30 mLs by mouth every 4 (four) hours as needed for indigestion or heartburn.   amitriptyline 25 MG tablet Commonly known as: ELAVIL Take 50 mg by mouth daily.   ARTIFICIAL TEAR OP Apply 1 drop to eye 3 (three) times daily.   aspirin 81 MG chewable tablet Chew 81 mg by mouth daily.   bisacodyl 10 MG suppository Commonly known as: DULCOLAX Place 10 mg rectally daily as needed for moderate constipation.   bisacodyl 5 MG EC tablet Commonly known as: DULCOLAX Take 5 mg by mouth daily as needed for moderate constipation.   carbamide peroxide 6.5 % OTIC solution Commonly known as: DEBROX 5 drops as needed (impaction). 5 drops to affected ear, otic (ear). At bedtime, PRN, Identify cerumen impaction by direct visualization by use of otoscope. Place 5 drops to affected ear QHS x3 nights then irrigate with bulb syringe. If not effective notify MD.   clonazePAM 0.25 MG disintegrating tablet Commonly known as: KLONOPIN Take 0.25 mg by mouth 2 (two) times daily. Morning and evening PRN   diclofenac Sodium 1 % Gel Commonly known as: VOLTAREN Apply 2 g topically 2 (two) times daily as needed (pain). apply to lower back area   famotidine 20 MG tablet Commonly known as: PEPCID Take 20 mg by mouth at bedtime.   fluPHENAZine 1 MG tablet Commonly known as: PROLIXIN Take 1 mg by mouth at bedtime.   furosemide 20 MG tablet Commonly known as: LASIX Take 20 mg by mouth daily.   guaiFENesin 600 MG 12 hr tablet Commonly known as: MUCINEX Take 600 mg by mouth daily as needed (Cold or cogestion).   hydrocortisone  valerate cream 0.2 % Commonly known as: WESTCORT Apply 1 application  topically 2 (two) times daily. APPLY TO RASH ON FACE AND NECK TWICE DAILY AS NEEDED   ketoconazole 2 % shampoo Commonly known as: NIZORAL Apply 1 application. topically 2 (two) times a week. Tuesday and Friday   ketoconazole 2 % cream Commonly known as: NIZORAL Apply 1 application  topically 2 (two) times daily. APPLY TO RASH  ON FACE AND NECK TWICE DAILY AS NEEDED   loratadine 10 MG tablet Commonly known as: CLARITIN Take 10 mg by mouth daily.   LORazepam 0.5 MG tablet Commonly known as: ATIVAN Take 1 tablet (0.5 mg total) by mouth 2 (two) times daily.   magnesium hydroxide 400 MG/5ML suspension Commonly known as: MILK OF MAGNESIA Take 30 mLs by mouth daily as needed for mild constipation.   memantine 10 MG tablet Commonly known as: NAMENDA Take 10 mg by mouth 2 (two) times daily.   MIRALAX PO Take 17 g by mouth See admin instructions. Take 17 gm, oral twice a day, every other day. Miralax 17 gm + 4-8oz fluid mizture BID   ORAJEL MT Use as directed in the mouth or throat. 20-0.26-0.15 %; amt: SMALL AMOUNT Three Times A Day - PRN;  mucous membrane Special Instructions: Apply to right inner mouth sore TID PRN [DX: Other specified disorders of teeth and supporting structures]  Every 4 Hours - PRN;may self-administer   OYSTER SHELL PO Take 250 mg by mouth in the morning and at bedtime.   saccharomyces boulardii 250 MG capsule Commonly known as: FLORASTOR Take 250 mg by mouth daily.   Sentry Senior Tabs Take 1 tablet by mouth daily. 500-300-250 mcg [DX: Vitamin deficiency, unspecified]   spironolactone 25 MG tablet Commonly known as: ALDACTONE Take 12.5 mg by mouth daily.   triamcinolone cream 0.1 % Commonly known as: KENALOG Apply 1 application  topically as needed (rash). Apply BID as needed to rash to chest, behind L ear, left posterior neck.        Review of Systems  Constitutional:   Negative for appetite change, fatigue and fever.  HENT:  Positive for hearing loss. Negative for congestion, rhinorrhea and voice change.   Eyes:  Positive for visual disturbance.       Low vision  Respiratory:  Negative for cough and shortness of breath.   Cardiovascular:  Negative for leg swelling.  Gastrointestinal:  Negative for abdominal pain and constipation.  Genitourinary:  Negative for dysuria, frequency and urgency.  Musculoskeletal:  Positive for arthralgias and gait problem.  Skin:  Negative for rash.  Neurological:  Negative for speech difficulty, weakness and light-headedness.       Memory lapses  Psychiatric/Behavioral:  Positive for confusion. Negative for behavioral problems, hallucinations and sleep disturbance. The patient is not nervous/anxious.     Immunization History  Administered Date(s) Administered   Influenza Split 07/23/2013   Influenza Whole 07/05/2009, 06/26/2018   Influenza-Unspecified 07/29/2014, 06/14/2015, 07/11/2016, 07/15/2017, 06/27/2019, 07/05/2020, 07/13/2021   Moderna SARS-COV2 Booster Vaccination 02/09/2022   Moderna Sars-Covid-2 Vaccination 09/26/2019, 10/24/2019, 08/02/2020   PFIZER(Purple Top)SARS-COV-2 Vaccination 09/26/2019   PPD Test 06/17/2013   Pneumococcal Conjugate-13 07/22/2017   Pneumococcal Polysaccharide-23 05/05/2009   Td 05/05/2009, 07/16/2019   Unspecified SARS-COV-2 Vaccination 02/21/2021, 06/13/2021   Zoster Recombinat (Shingrix) 09/27/2021, 12/05/2021   Zoster, Live 01/09/2011   Pertinent  Health Maintenance Due  Topic Date Due   INFLUENZA VACCINE  04/24/2022   DEXA SCAN  Completed      06/07/2022    5:37 PM 06/08/2022    4:25 AM 06/08/2022    5:57 PM 06/08/2022   11:00 PM 06/09/2022   10:00 AM  Fall Risk  Patient Fall Risk Level Moderate fall risk High fall risk High fall risk High fall risk High fall risk   Functional Status Survey:    Vitals:   07/02/22 1421  BP: (!) 167/85  Pulse: 83  Resp:  18  Temp: (!)  96.6 F (35.9 C)  SpO2: 99%   There is no height or weight on file to calculate BMI. Physical Exam Constitutional:      Appearance: Normal appearance.  HENT:     Head: Normocephalic.     Nose: Nose normal.     Mouth/Throat:     Mouth: Mucous membranes are moist.  Eyes:     Extraocular Movements: Extraocular movements intact.     Conjunctiva/sclera: Conjunctivae normal.     Pupils: Pupils are equal, round, and reactive to light.     Comments: Legally blind  Cardiovascular:     Rate and Rhythm: Normal rate and regular rhythm.     Heart sounds: No murmur heard.    Comments: DP pulses R+L present.  Pulmonary:     Effort: Pulmonary effort is normal.     Breath sounds: No rales.  Abdominal:     General: Bowel sounds are normal.     Palpations: Abdomen is soft.     Tenderness: There is no abdominal tenderness. There is no right CVA tenderness, left CVA tenderness, guarding or rebound.  Genitourinary:    Comments: From previous examination a pinto bean sized cyst like lump palpated near the clitoris, asymptomatic.  Musculoskeletal:     Cervical back: Normal range of motion and neck supple.     Right lower leg: No edema.     Left lower leg: No edema.  Skin:    General: Skin is warm and dry.  Neurological:     General: No focal deficit present.     Mental Status: She is alert. Mental status is at baseline.     Gait: Gait abnormal.     Comments: Oriented to person, place.   Psychiatric:        Mood and Affect: Mood normal.        Behavior: Behavior normal.        Thought Content: Thought content normal.     Comments: The patient appears at her baseline mentation during my examination.      Labs reviewed: Recent Labs    06/07/22 1641 06/07/22 1646 06/08/22 0032 06/09/22 0504  NA 139 138 139 141  K 4.7 5.0 4.8 4.3  CL 101 103 104 107  CO2 25  --  25 22  GLUCOSE 98 97 102* 99  BUN 19 27* 21 14  CREATININE 1.16* 1.00 0.92 0.95  CALCIUM 9.5  --  9.1 9.4  MG  --   --   2.3 2.1  PHOS  --   --  3.8 3.3   Recent Labs    04/26/22 0000 06/07/22 1641 06/08/22 0032 06/09/22 0504  AST 23 29 34  --   ALT '16 18 19  '$ --   ALKPHOS 130* 100 98  --   BILITOT  --  0.3 0.6  --   PROT  --  7.2 6.7  --   ALBUMIN 4.6 4.0 3.8 3.5   Recent Labs    09/19/21 0000 04/26/22 0000 06/07/22 1641 06/07/22 1646 06/08/22 0032 06/09/22 0504  WBC 10.3 5.9 6.4  --  9.2 7.4  NEUTROABS 6,365.00 3,039.00 3.1  --   --   --   HGB 11.9* 13.6 15.1* 16.3* 12.8 13.4  HCT 37 40 46.3* 48.0* 39.3 39.7  MCV  --   --  96.9  --  95.6 95.4  PLT 465* 353 303  --  333 304   Lab Results  Component Value  Date   TSH 1.62 08/15/2021   Lab Results  Component Value Date   HGBA1C 5.6 06/08/2022   Lab Results  Component Value Date   CHOL 201 (H) 06/08/2022   HDL 71 06/08/2022   LDLCALC 113 (H) 06/08/2022   LDLDIRECT 190.0 03/24/2012   TRIG 84 06/08/2022   CHOLHDL 2.8 06/08/2022    Significant Diagnostic Results in last 30 days:  ECHOCARDIOGRAM COMPLETE  Result Date: 06/08/2022    ECHOCARDIOGRAM REPORT   Patient Name:   MARNISHA STAMPLEY Date of Exam: 06/08/2022 Medical Rec #:  [id number]      Height:       65.0 in Accession #:    [id number]     Weight:       151.7 lb Date of Birth:  1940-12-10     BSA:          1.759 m Patient Age:    9 years       BP:           161/48 mmHg Patient Gender: F              HR:           86 bpm. Exam Location:  Inpatient Procedure: 2D Echo, Cardiac Doppler and Color Doppler Indications:    TIA  History:        Patient has no prior history of Echocardiogram examinations.                 Risk Factors:Dyslipidemia and Hypertension. PVD. CKD.  Sonographer:    Clayton Lefort RDCS (AE) Referring Phys: Derek Jack  Sonographer Comments: Technically difficult study due to poor echo windows. IMPRESSIONS  1. Left ventricular ejection fraction, by estimation, is 55 to 60%. The left ventricle has normal function. Left ventricular endocardial border not optimally defined to  evaluate regional wall motion. Left ventricular diastolic parameters are indeterminate.  2. Right ventricular systolic function is normal. The right ventricular size is normal.  3. The mitral valve was not well visualized. No evidence of mitral valve regurgitation. No evidence of mitral stenosis.  4. The aortic valve was not well visualized. Aortic valve regurgitation is not visualized. No aortic stenosis is present. Comparison(s): No prior Echocardiogram. Conclusion(s)/Recommendation(s): Very limited imaged due to poor echo windows. Grossly normal RV and LV function. No severe valve disease by Doppler, but valves not well visualized. Consider TEE if indicated to exclude cardioembolic source. FINDINGS  Left Ventricle: Left ventricular ejection fraction, by estimation, is 55 to 60%. The left ventricle has normal function. Left ventricular endocardial border not optimally defined to evaluate regional wall motion. The left ventricular internal cavity size was normal in size. There is borderline left ventricular hypertrophy. Left ventricular diastolic parameters are indeterminate. Right Ventricle: The right ventricular size is normal. Right vetricular wall thickness was not well visualized. Right ventricular systolic function is normal. Left Atrium: Left atrial size was normal in size. Right Atrium: Right atrial size was normal in size. Pericardium: There is no evidence of pericardial effusion. Presence of epicardial fat layer. Mitral Valve: The mitral valve was not well visualized. No evidence of mitral valve regurgitation. No evidence of mitral valve stenosis. Tricuspid Valve: The tricuspid valve is not well visualized. Tricuspid valve regurgitation is trivial. No evidence of tricuspid stenosis. Aortic Valve: The aortic valve was not well visualized. Aortic valve regurgitation is not visualized. No aortic stenosis is present. Aortic valve mean gradient measures 3.0 mmHg. Aortic valve peak gradient measures 3.9 mmHg.  Aortic valve area, by VTI measures 3.60 cm. Pulmonic Valve: The pulmonic valve was not well visualized. Pulmonic valve regurgitation is not visualized. Aorta: The aortic root and ascending aorta are structurally normal, with no evidence of dilitation. Venous: The inferior vena cava was not well visualized. IAS/Shunts: The interatrial septum was not well visualized.  LEFT VENTRICLE PLAX 2D LVIDd:         4.40 cm   Diastology LV PW:         1.30 cm   LV e' medial:    3.48 cm/s LV IVS:        1.10 cm   LV E/e' medial:  16.3 LVOT diam:     2.30 cm   LV e' lateral:   7.72 cm/s LV SV:         74        LV E/e' lateral: 7.3 LV SV Index:   42 LVOT Area:     4.15 cm  RIGHT VENTRICLE RV Basal diam:  2.70 cm RV S prime:     12.80 cm/s TAPSE (M-mode): 1.4 cm LEFT ATRIUM             Index        RIGHT ATRIUM           Index LA diam:        4.20 cm 2.39 cm/m   RA Area:     12.60 cm LA Vol (A2C):   19.7 ml 11.20 ml/m  RA Volume:   29.70 ml  16.89 ml/m LA Vol (A4C):   38.9 ml 22.12 ml/m LA Biplane Vol: 30.7 ml 17.46 ml/m  AORTIC VALVE AV Area (Vmax):    3.35 cm AV Area (Vmean):   3.48 cm AV Area (VTI):     3.60 cm AV Vmax:           99.10 cm/s AV Vmean:          74.900 cm/s AV VTI:            0.204 m AV Peak Grad:      3.9 mmHg AV Mean Grad:      3.0 mmHg LVOT Vmax:         80.00 cm/s LVOT Vmean:        62.800 cm/s LVOT VTI:          0.177 m LVOT/AV VTI ratio: 0.87  AORTA Ao Root diam: 2.70 cm Ao Asc diam:  2.90 cm MITRAL VALVE MV Area (PHT): 3.19 cm    SHUNTS MV Decel Time: 238 msec    Systemic VTI:  0.18 m MV E velocity: 56.70 cm/s  Systemic Diam: 2.30 cm MV A velocity: 96.20 cm/s MV E/A ratio:  0.59 Buford Dresser MD Electronically signed by Buford Dresser MD Signature Date/Time: 06/08/2022/6:43:11 PM    Final    EEG adult  Result Date: 06/08/2022 Lora Havens, MD     06/08/2022  5:14 AM Patient Name: LEIGHANN AMADON MRN: [id number] Epilepsy Attending: Lora Havens Referring Physician/Provider:  Derek Jack, MD Date: 06/08/2022 Duration: 21.48 mins Patient history: 81yo F with ams. EEG to evaluate for seizure. Level of alertness: Awake AEDs during EEG study: Ativan, Librium Technical aspects: This EEG study was done with scalp electrodes positioned according to the 10-20 International system of electrode placement. Electrical activity was reviewed with band pass filter of 1-'70Hz'$ , sensitivity of 7 uV/mm, display speed of 27m/sec with a '60Hz'$  notched filter applied as appropriate. EEG data were  recorded continuously and digitally stored.  Video monitoring was available and reviewed as appropriate. Description: No posterior dominant rhythm was seen. There was an excessive amount of 15 to 18 Hz beta activity distributed symmetrically and diffusely.  Hyperventilation and photic stimulation were not performed.   ABNORMALITY - Excessive beta, generalized IMPRESSION: This study is within normal limits. The excessive beta activity seen in the background is most likely due to the effect of benzodiazepine and is a benign EEG pattern. No seizures or epileptiform discharges were seen throughout the recording. A normal interictal EEG does not exclude nor support the diagnosis of epilepsy. Lora Havens   MR BRAIN W WO CONTRAST  Result Date: 06/08/2022 CLINICAL DATA:  Transient ischemic attack.  New onset seizure. EXAM: MRI HEAD WITHOUT AND WITH CONTRAST MRA HEAD WITHOUT CONTRAST MRA NECK WITHOUT AND WITH CONTRAST TECHNIQUE: Multiplanar, multiecho pulse sequences of the brain and surrounding structures were obtained without and with intravenous contrast. Angiographic images of the Circle of Willis were obtained using MRA technique without intravenous contrast. Angiographic images of the neck were obtained using MRA technique without and with intravenous contrast. Carotid stenosis measurements (when applicable) are obtained utilizing NASCET criteria, using the distal internal carotid diameter as the denominator.  CONTRAST:  6.5 mL gadopiclenol (Vueway) 0.5 mmol/ml solution COMPARISON:  None Available. FINDINGS: MRI HEAD FINDINGS Brain: No acute infarct, mass effect or extra-axial collection. No acute or chronic hemorrhage. There is multifocal hyperintense T2-weighted signal within the white matter. Generalized volume loss. The midline structures are normal. There is no abnormal contrast enhancement. Vascular: Major flow voids are preserved. Skull and upper cervical spine: Normal calvarium and skull base. Visualized upper cervical spine and soft tissues are normal. Sinuses/Orbits:No paranasal sinus fluid levels or advanced mucosal thickening. No mastoid or middle ear effusion. Normal orbits. MRA HEAD FINDINGS POSTERIOR CIRCULATION: --Vertebral arteries: Normal --Inferior cerebellar arteries: Normal. --Basilar artery: Normal. --Superior cerebellar arteries: Normal. --Posterior cerebral arteries: Normal. ANTERIOR CIRCULATION: --Intracranial internal carotid arteries: Normal. --Anterior cerebral arteries (ACA): Normal. --Middle cerebral arteries (MCA): At the right MCA bifurcation, there is a laterally projecting outpouching that measures 2 mm. This may be a small aneurysm or a chronically occluded branch vessel. Otherwise, the middle cerebral arteries are normal. ANATOMIC VARIANTS: None MRA NECK FINDINGS Left dominant vertebral arteries are patent and of normal caliber. Both carotid systems are normal without stenosis. IMPRESSION: 1. No acute intracranial abnormality. 2. 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 3. No emergent large vessel occlusion or high-grade stenosis. 4. A 2 mm laterally projecting outpouching at the right MCA bifurcation may be a small aneurysm or an occluded branch vessel. Given the lack of acute ischemia, this is unlikely to be an acute occlusion. Electronically Signed   By: Ulyses Jarred M.D.   On: 06/08/2022 01:42   MR ANGIO HEAD WO CONTRAST  Result Date: 06/08/2022 CLINICAL DATA:   Transient ischemic attack.  New onset seizure. EXAM: MRI HEAD WITHOUT AND WITH CONTRAST MRA HEAD WITHOUT CONTRAST MRA NECK WITHOUT AND WITH CONTRAST TECHNIQUE: Multiplanar, multiecho pulse sequences of the brain and surrounding structures were obtained without and with intravenous contrast. Angiographic images of the Circle of Willis were obtained using MRA technique without intravenous contrast. Angiographic images of the neck were obtained using MRA technique without and with intravenous contrast. Carotid stenosis measurements (when applicable) are obtained utilizing NASCET criteria, using the distal internal carotid diameter as the denominator. CONTRAST:  6.5 mL gadopiclenol (Vueway) 0.5 mmol/ml solution COMPARISON:  None Available.  FINDINGS: MRI HEAD FINDINGS Brain: No acute infarct, mass effect or extra-axial collection. No acute or chronic hemorrhage. There is multifocal hyperintense T2-weighted signal within the white matter. Generalized volume loss. The midline structures are normal. There is no abnormal contrast enhancement. Vascular: Major flow voids are preserved. Skull and upper cervical spine: Normal calvarium and skull base. Visualized upper cervical spine and soft tissues are normal. Sinuses/Orbits:No paranasal sinus fluid levels or advanced mucosal thickening. No mastoid or middle ear effusion. Normal orbits. MRA HEAD FINDINGS POSTERIOR CIRCULATION: --Vertebral arteries: Normal --Inferior cerebellar arteries: Normal. --Basilar artery: Normal. --Superior cerebellar arteries: Normal. --Posterior cerebral arteries: Normal. ANTERIOR CIRCULATION: --Intracranial internal carotid arteries: Normal. --Anterior cerebral arteries (ACA): Normal. --Middle cerebral arteries (MCA): At the right MCA bifurcation, there is a laterally projecting outpouching that measures 2 mm. This may be a small aneurysm or a chronically occluded branch vessel. Otherwise, the middle cerebral arteries are normal. ANATOMIC VARIANTS:  None MRA NECK FINDINGS Left dominant vertebral arteries are patent and of normal caliber. Both carotid systems are normal without stenosis. IMPRESSION: 1. No acute intracranial abnormality. 2. 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 3. No emergent large vessel occlusion or high-grade stenosis. 4. A 2 mm laterally projecting outpouching at the right MCA bifurcation may be a small aneurysm or an occluded branch vessel. Given the lack of acute ischemia, this is unlikely to be an acute occlusion. Electronically Signed   By: Ulyses Jarred M.D.   On: 06/08/2022 01:42   MR ANGIO NECK W WO CONTRAST  Result Date: 06/08/2022 CLINICAL DATA:  Transient ischemic attack.  New onset seizure. EXAM: MRI HEAD WITHOUT AND WITH CONTRAST MRA HEAD WITHOUT CONTRAST MRA NECK WITHOUT AND WITH CONTRAST TECHNIQUE: Multiplanar, multiecho pulse sequences of the brain and surrounding structures were obtained without and with intravenous contrast. Angiographic images of the Circle of Willis were obtained using MRA technique without intravenous contrast. Angiographic images of the neck were obtained using MRA technique without and with intravenous contrast. Carotid stenosis measurements (when applicable) are obtained utilizing NASCET criteria, using the distal internal carotid diameter as the denominator. CONTRAST:  6.5 mL gadopiclenol (Vueway) 0.5 mmol/ml solution COMPARISON:  None Available. FINDINGS: MRI HEAD FINDINGS Brain: No acute infarct, mass effect or extra-axial collection. No acute or chronic hemorrhage. There is multifocal hyperintense T2-weighted signal within the white matter. Generalized volume loss. The midline structures are normal. There is no abnormal contrast enhancement. Vascular: Major flow voids are preserved. Skull and upper cervical spine: Normal calvarium and skull base. Visualized upper cervical spine and soft tissues are normal. Sinuses/Orbits:No paranasal sinus fluid levels or advanced mucosal  thickening. No mastoid or middle ear effusion. Normal orbits. MRA HEAD FINDINGS POSTERIOR CIRCULATION: --Vertebral arteries: Normal --Inferior cerebellar arteries: Normal. --Basilar artery: Normal. --Superior cerebellar arteries: Normal. --Posterior cerebral arteries: Normal. ANTERIOR CIRCULATION: --Intracranial internal carotid arteries: Normal. --Anterior cerebral arteries (ACA): Normal. --Middle cerebral arteries (MCA): At the right MCA bifurcation, there is a laterally projecting outpouching that measures 2 mm. This may be a small aneurysm or a chronically occluded branch vessel. Otherwise, the middle cerebral arteries are normal. ANATOMIC VARIANTS: None MRA NECK FINDINGS Left dominant vertebral arteries are patent and of normal caliber. Both carotid systems are normal without stenosis. IMPRESSION: 1. No acute intracranial abnormality. 2. 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 3. No emergent large vessel occlusion or high-grade stenosis. 4. A 2 mm laterally projecting outpouching at the right MCA bifurcation may be a small aneurysm or an occluded branch  vessel. Given the lack of acute ischemia, this is unlikely to be an acute occlusion. Electronically Signed   By: Ulyses Jarred M.D.   On: 06/08/2022 01:42   CT HEAD CODE STROKE WO CONTRAST  Result Date: 06/07/2022 CLINICAL DATA:  Code stroke.  Neuro deficit, acute, stroke suspected EXAM: CT HEAD WITHOUT CONTRAST TECHNIQUE: Contiguous axial images were obtained from the base of the skull through the vertex without intravenous contrast. RADIATION DOSE REDUCTION: This exam was performed according to the departmental dose-optimization program which includes automated exposure control, adjustment of the mA and/or kV according to patient size and/or use of iterative reconstruction technique. COMPARISON:  CT head March 10, 2021. FINDINGS: Brain: No evidence of acute large vascular territory infarction, hemorrhage, hydrocephalus, extra-axial collection or  mass lesion/mass effect. Mild for age patchy white matter hypoattenuation, nonspecific but compatible with chronic microvascular ischemic disease. Vascular: No hyperdense vessel identified. Calcific intracranial atherosclerosis. Skull: No acute fracture. Sinuses/Orbits: No acute findings. ASPECTS [facility] Stroke Program Early CT Score) total score (0-10 with 10 being normal): 10. IMPRESSION: 1. No evidence of acute intracranial abnormality. 2. ASPECTS is 10. Code stroke imaging results were communicated on 06/07/2022 at 4:52 pm to provider Dr. Quinn Axe Via secure text paging. Electronically Signed   By: Margaretha Sheffield M.D.   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 CT head was negative for acute process. Taking Memantine.  Constipation Stable,  on prn Dulcolax po/suppository, prn MOM, MiraLax qd/bid alternative.   Seasonal and perennial allergic rhinitis takes Claritin, Flonase, Mucinex         Osteoporosis stopped taking Prolia due to cost, ? Allergic to Fosamax.  her son declined DEXA  Essential hypertension blood pressure is controlled on diuretics, intermittent elevated Sbp, asymptomatic. 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    Family/ staff Communication: plan of care reviewed with the patient and charge nurse.   Labs/tests ordered: none  Time spend 35 minutes.

## 2022-07-02 NOTE — Assessment & Plan Note (Signed)
Stable, on prn Dulcolax po/suppository, prn MOM, MiraLax qd/bid alternative.  

## 2022-07-02 NOTE — Assessment & Plan Note (Signed)
supportive care in memory care unit [facility] since Flu 08/2021. CT head was negative for acute process. Taking Memantine.  

## 2022-07-02 NOTE — Assessment & Plan Note (Signed)
Depression/anxiety/hallucination, stabilizing, no further c/o visual hallucination,  resumed Amitriptyline at '50mg'$  qd-failed GDR, off Librium, continued Fluphenazine, on Clonazepam, TSH 1.62 08/15/21

## 2022-07-02 NOTE — Assessment & Plan Note (Signed)
on Tylenol, ambulates with walker 

## 2022-07-02 NOTE — Assessment & Plan Note (Signed)
takes Claritin, Flonase, Mucinex       

## 2022-07-02 NOTE — Assessment & Plan Note (Signed)
stopped taking Prolia due to cost, ? Allergic to Fosamax.  her son declined DEXA 

## 2022-07-11 ENCOUNTER — Other Ambulatory Visit: Payer: Self-pay | Admitting: Adult Health

## 2022-07-11 MED ORDER — CLONAZEPAM 0.25 MG PO TBDP: 0.2500 mg | ORAL_TABLET | Freq: Two times a day (BID) | ORAL | 0 refills | Status: DC

## 2022-08-07 ENCOUNTER — Non-Acute Institutional Stay (SKILLED_NURSING_FACILITY): Payer: Medicare Other | Admitting: Family Medicine

## 2022-08-07 DIAGNOSIS — R4182 Altered mental status, unspecified: Secondary | ICD-10-CM | POA: Diagnosis not present

## 2022-08-07 DIAGNOSIS — F323 Major depressive disorder, single episode, severe with psychotic features: Secondary | ICD-10-CM | POA: Diagnosis not present

## 2022-08-07 DIAGNOSIS — I10 Essential (primary) hypertension: Secondary | ICD-10-CM

## 2022-08-07 DIAGNOSIS — N1832 Chronic kidney disease, stage 3b: Secondary | ICD-10-CM | POA: Diagnosis not present

## 2022-08-07 DIAGNOSIS — R410 Disorientation, unspecified: Secondary | ICD-10-CM | POA: Diagnosis not present

## 2022-08-07 DIAGNOSIS — K219 Gastro-esophageal reflux disease without esophagitis: Secondary | ICD-10-CM

## 2022-08-07 NOTE — Progress Notes (Signed)
Provider:  Alain Honey, MD Location:      Place of Service:     PCP: Jean Dad, MD Patient Care Team: Jean Dad, MD as PCP - General (Internal Medicine) [name], MD (Ophthalmology) [name], MD (Psychiatry) Mast, Man X, NP as Nurse Practitioner (Nurse Practitioner) [name], MD as Consulting Physician (Orthopedic Surgery)  Extended Emergency Contact Information Primary Emergency Contact: [name], [place] Work Phone: [phone] Relation: Son Secondary Emergency Contact: [name] [place] Phone: [phone] Relation: Sister  Code Status:  Goals of Care: Advanced Directive information    06/18/2022   11:07 AM  Advanced Directives  Type of Advance Directive Healthcare Power of [place];Living will;Out of facility DNR (pink MOST or yellow form)  Copy of [place] in Chart? Yes - validated most recent copy scanned in chart (See row information)  Pre-existing out of facility DNR order (yellow form or pink MOST form) Pink Most/Yellow Form available - Physician notified to receive inpatient order;Yellow form placed in chart (order not valid for inpatient use)      No chief complaint on file.   HPI: Patient is a 81 y.o. female seen today for medical management of chronic problems including depression, dementia, constipation, hypertension, osteoarthritis, and GERD. Found patient napping in her room today.  She is very high functioning to be residing in memory care.  People who know her history better than may say there have been times when she was very confused and hallucinating and.  Some of that may have been due to prior infection with COVID, but today she tells me about her sons living in [place] in [place] and a sister who lives out of her suitcase and spends time with her daughters.  She recalls what she ate for lunch earlier today. She has no specific  complaints she does describe some vague upper back discomfort but denies pain in hips knees.  Some constipation for which she takes MiraLAX.  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, 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   Vitamin D deficiency    Past Surgical History:  Procedure Laterality Date   ABDOMINAL HYSTERECTOMY     ANAL RECTAL MANOMETRY N/A 07/05/2014   Procedure: ANO RECTAL MANOMETRY;  Surgeon: Leighton Ruff, MD;  Location: [facility];  Service: Endoscopy;  Laterality: N/A;   APPENDECTOMY     CAROTID ENDARTERECTOMY     COLONOSCOPY  2008, 2015   TONSILLECTOMY     UPPER GASTROINTESTINAL ENDOSCOPY  2002    reports that she has never smoked. She has never used smokeless tobacco. She reports that she does not drink  alcohol and does not use drugs. Social History   Socioeconomic History   Marital status: Divorced    Spouse name: Not on file   Number of children: 2   Years of education: Not on file   Highest education level: Not on file  Occupational History   Occupation: retired    Fish farm manager: RETIRED  Tobacco Use   Smoking status: Never   Smokeless tobacco: Never  Vaping Use   Vaping Use: Never used  Substance and Sexual Activity   Alcohol use: No    Alcohol/week: 0.0 standard drinks of alcohol   Drug use: No   Sexual activity: Never  Other Topics Concern   Not on file  Social History Narrative    HSG, business college   Married 59-87yr/divorced   2 son-'60; 2 granddaughters   Work: sNetwork engineer travel clerk   [place]   Patient never smoked. Did have second hand smoke exposure childhood and work   Alcohol none   Walks with walker   Social Determinants of Health   Financial Resource Strain: Low Risk  (06/10/2018)   Overall Financial Resource Strain (CARDIA)    Difficulty of Paying Living Expenses: Not hard at all  Food Insecurity: No Food Insecurity (06/10/2018)   Hunger Vital Sign    Worried About Running Out of Food in the Last Year: Never true    Ran Out of Food in the Last Year: Never true  Transportation Needs: No Transportation Needs (06/10/2018)   PRAPARE - THydrologist(Medical): No    Lack of Transportation (Non-Medical): No  Physical Activity: Sufficiently Active (06/10/2018)   Exercise Vital Sign    Days of Exercise per Week: 5 days    Minutes of Exercise per Session: 30 min  Stress: No Stress Concern Present (06/10/2018)   [place]   Feeling of Stress : Not at all  Social Connections: Moderately Isolated (06/10/2018)   Social Connection and Isolation Panel [NHANES]    Frequency of Communication with Friends and Family: Twice a week    Frequency of Social Gatherings with Friends and Family: Twice a week    Attends Religious Services: Never    AMarine scientistor Organizations: No    Attends CArchivistMeetings: Never    Marital Status: Divorced  IHuman resources officerViolence: Not At Risk (06/10/2018)   Humiliation, Afraid, Rape, and Kick questionnaire    Fear of Current or Ex-Partner: No    Emotionally Abused: No    Physically Abused: No    Sexually Abused: No    Functional Status Survey:    Family History  Problem Relation Age of Onset   Alzheimer's disease Mother    Coronary artery disease  Father    Heart attack Father    Heart disease Father    Anuerysm Father        AAA   Ovarian cancer Sister    Lung cancer Brother    Heart disease Brother    Anuerysm Brother        AAA   Diabetes Neg Hx    Colon cancer Neg Hx     Health Maintenance  Topic Date Due   INFLUENZA VACCINE  04/24/2022   COVID-19 Vaccine (7 - Mixed Product series) 06/12/2022   Medicare Annual Wellness (AWV)  09/15/2022   TETANUS/TDAP  07/15/2029   Pneumonia Vaccine 81 Years old  Completed   DEXA SCAN  Completed   Zoster Vaccines- Shingrix  Completed   HPV VACCINES  Aged Out    Allergies  Allergen Reactions   Biaxin [Clarithromycin]    Clarithromycin    Doxycycline    Fosamax [Alendronate Sodium]    Geodon [Ziprasidone Hydrochloride]    Lipitor [Atorvastatin Calcium]    Penicillins    Pravachol [Pravastatin Sodium]    Statins    Sulfonamide Derivatives    Zithromax [Azithromycin]    Zocor [Simvastatin]     Outpatient Encounter Medications as of 08/07/2022  Medication Sig   alum & mag hydroxide-simeth (MAALOX/MYLANTA) 200-200-20 MG/5ML suspension Take 30 mLs by mouth every 4 (four) hours as needed for indigestion or heartburn.   amitriptyline (ELAVIL) 25 MG tablet Take 50 mg by mouth daily.   ARTIFICIAL TEAR OP Apply 1 drop to eye 3 (three) times daily.   aspirin 81 MG chewable tablet Chew 81 mg by mouth daily.   Benzocaine (ORAJEL MT) Use as directed in the mouth or throat. 20-0.26-0.15 %; amt: SMALL AMOUNT Three Times A Day - PRN;  mucous membrane Special Instructions: Apply to right inner mouth sore TID PRN [DX: Other specified disorders of teeth and supporting structures]  Every 4 Hours - PRN;may self-administer   bisacodyl (DULCOLAX) 10 MG suppository Place 10 mg rectally daily as needed for moderate constipation.   bisacodyl (DULCOLAX) 5 MG EC tablet Take 5 mg by mouth daily as needed for moderate constipation.   carbamide peroxide (DEBROX) 6.5 % OTIC solution 5 drops as needed  (impaction). 5 drops to affected ear, otic (ear). At bedtime, PRN, Identify cerumen impaction by direct visualization by use of otoscope. Place 5 drops to affected ear QHS x3 nights then irrigate with bulb syringe. If not effective notify MD.   clonazePAM (KLONOPIN) 0.25 MG disintegrating tablet Take 1 tablet (0.25 mg total) by mouth 2 (two) times daily. Morning and evening   diclofenac Sodium (VOLTAREN) 1 % GEL Apply 2 g topically 2 (two) times daily as needed (pain). apply to lower back area   famotidine (PEPCID) 20 MG tablet Take 20 mg by mouth at bedtime.   fluPHENAZine (PROLIXIN) 1 MG tablet Take 1 mg by mouth at bedtime.   furosemide (LASIX) 20 MG tablet Take 20 mg by mouth daily.   guaiFENesin (MUCINEX) 600 MG 12 hr tablet Take 600 mg by mouth daily as needed (Cold or cogestion).   hydrocortisone valerate cream (WESTCORT) 0.2 % Apply 1 application  topically 2 (two) times daily. APPLY TO RASH ON FACE AND NECK TWICE DAILY AS NEEDED   ketoconazole (NIZORAL) 2 % cream Apply 1 application  topically 2 (two) times daily. APPLY TO RASH ON FACE AND NECK TWICE DAILY AS NEEDED   ketoconazole (NIZORAL) 2 % shampoo Apply 1 application. topically 2 (two) times a week. Tuesday and Friday   loratadine (CLARITIN) 10 MG tablet Take 10 mg by mouth daily.   LORazepam (ATIVAN) 0.5 MG tablet Take 1 tablet (0.5 mg total) by mouth 2 (two) times daily.   magnesium hydroxide (MILK OF MAGNESIA) 400 MG/5ML suspension Take 30 mLs by mouth daily as needed for mild constipation.   memantine (NAMENDA) 10 MG tablet Take 10 mg by mouth 2 (two) times daily.   Multiple Vitamins-Minerals (SENTRY SENIOR) TABS Take 1 tablet by mouth daily. 500-300-250 mcg [DX: Vitamin deficiency, unspecified]   OYSTER SHELL PO Take 250 mg by mouth in the morning and at bedtime.   Polyethylene Glycol 3350 (MIRALAX  PO) Take 17 g by mouth See admin instructions. Take 17 gm, oral twice a day, every other day. Miralax 17 gm + 4-8oz fluid mizture BID    saccharomyces boulardii (FLORASTOR) 250 MG capsule Take 250 mg by mouth daily.   spironolactone (ALDACTONE) 25 MG tablet Take 12.5 mg by mouth daily.   triamcinolone cream (KENALOG) 0.1 % Apply 1 application  topically as needed (rash). Apply BID as needed to rash to chest, behind L ear, left posterior neck.   No facility-administered encounter medications on file as of 08/07/2022.    Review of Systems  Constitutional: Negative.   HENT: Negative.    Respiratory: Negative.    Cardiovascular: Negative.   Neurological: Negative.   Psychiatric/Behavioral:  Positive for confusion.   All other systems reviewed and are negative.   There were no vitals filed for this visit. There is no height or weight on file to calculate BMI. Physical Exam Vitals and nursing note reviewed.  Constitutional:      Appearance: Normal appearance.  Eyes:     Extraocular Movements: Extraocular movements intact.     Pupils: Pupils are equal, round, and reactive to light.  Cardiovascular:     Rate and Rhythm: Normal rate and regular rhythm.     Heart sounds: Murmur heard.  Pulmonary:     Effort: Pulmonary effort is normal.     Breath sounds: Normal breath sounds.  Abdominal:     General: Bowel sounds are normal.     Palpations: Abdomen is soft.  Musculoskeletal:        General: Normal range of motion.  Neurological:     General: No focal deficit present.     Mental Status: She is alert and oriented to person, place, and time.  Psychiatric:        Mood and Affect: Mood normal.        Behavior: Behavior normal.     Labs reviewed: Basic Metabolic Panel: Recent Labs    06/07/22 1641 06/07/22 1646 06/08/22 0032 06/09/22 0504  NA 139 138 139 141  K 4.7 5.0 4.8 4.3  CL 101 103 104 107  CO2 25  --  25 22  GLUCOSE 98 97 102* 99  BUN 19 27* 21 14  CREATININE 1.16* 1.00 0.92 0.95  CALCIUM 9.5  --  9.1 9.4  MG  --   --  2.3 2.1  PHOS  --   --  3.8 3.3   Liver Function Tests: Recent Labs     04/26/22 0000 06/07/22 1641 06/08/22 0032 06/09/22 0504  AST 23 29 34  --   ALT '16 18 19  '$ --   ALKPHOS 130* 100 98  --   BILITOT  --  0.3 0.6  --   PROT  --  7.2 6.7  --   ALBUMIN 4.6 4.0 3.8 3.5   No results for input(s): "LIPASE", "AMYLASE" in the last 8760 hours. No results for input(s): "AMMONIA" in the last 8760 hours. CBC: Recent Labs    09/19/21 0000 04/26/22 0000 06/07/22 1641 06/07/22 1646 06/08/22 0032 06/09/22 0504  WBC 10.3 5.9 6.4  --  9.2 7.4  NEUTROABS 6,365.00 3,039.00 3.1  --   --   --   HGB 11.9* 13.6 15.1* 16.3* 12.8 13.4  HCT 37 40 46.3* 48.0* 39.3 39.7  MCV  --   --  96.9  --  95.6 95.4  PLT 465* 353 303  --  333 304   Cardiac Enzymes: No results  for input(s): "CKTOTAL", "CKMB", "CKMBINDEX", "TROPONINI" in the last 8760 hours. BNP: Invalid input(s): "POCBNP" Lab Results  Component Value Date   HGBA1C 5.6 06/08/2022   Lab Results  Component Value Date   TSH 1.62 08/15/2021   Lab Results  Component Value Date   VITAMINB12 404 12/31/2010   Lab Results  Component Value Date   FOLATE 12.6 05/11/2010   No results found for: "IRON", "TIBC", "FERRITIN"  Imaging and Procedures obtained prior to SNF admission: ECHOCARDIOGRAM COMPLETE  Result Date: 06/08/2022    ECHOCARDIOGRAM REPORT   Patient Name:   KAMILLAH DIDONATO Date of Exam: 06/08/2022 Medical Rec #:  [id number]      Height:       65.0 in Accession #:    [id number]     Weight:       151.7 lb Date of Birth:  03/23/1941     BSA:          1.759 m Patient Age:    68 years       BP:           161/48 mmHg Patient Gender: F              HR:           86 bpm. Exam Location:  Inpatient Procedure: 2D Echo, Cardiac Doppler and Color Doppler Indications:    TIA  History:        Patient has no prior history of Echocardiogram examinations.                 Risk Factors:Dyslipidemia and Hypertension. PVD. CKD.  Sonographer:    Clayton Lefort RDCS (AE) Referring Phys: Derek Jack  Sonographer Comments: Technically  difficult study due to poor echo windows. IMPRESSIONS  1. Left ventricular ejection fraction, by estimation, is 55 to 60%. The left ventricle has normal function. Left ventricular endocardial border not optimally defined to evaluate regional wall motion. Left ventricular diastolic parameters are indeterminate.  2. Right ventricular systolic function is normal. The right ventricular size is normal.  3. The mitral valve was not well visualized. No evidence of mitral valve regurgitation. No evidence of mitral stenosis.  4. The aortic valve was not well visualized. Aortic valve regurgitation is not visualized. No aortic stenosis is present. Comparison(s): No prior Echocardiogram. Conclusion(s)/Recommendation(s): Very limited imaged due to poor echo windows. Grossly normal RV and LV function. No severe valve disease by Doppler, but valves not well visualized. Consider TEE if indicated to exclude cardioembolic source. FINDINGS  Left Ventricle: Left ventricular ejection fraction, by estimation, is 55 to 60%. The left ventricle has normal function. Left ventricular endocardial border not optimally defined to evaluate regional wall motion. The left ventricular internal cavity size was normal in size. There is borderline left ventricular hypertrophy. Left ventricular diastolic parameters are indeterminate. Right Ventricle: The right ventricular size is normal. Right vetricular wall thickness was not well visualized. Right ventricular systolic function is normal. Left Atrium: Left atrial size was normal in size. Right Atrium: Right atrial size was normal in size. Pericardium: There is no evidence of pericardial effusion. Presence of epicardial fat layer. Mitral Valve: The mitral valve was not well visualized. No evidence of mitral valve regurgitation. No evidence of mitral valve stenosis. Tricuspid Valve: The tricuspid valve is not well visualized. Tricuspid valve regurgitation is trivial. No evidence of tricuspid stenosis.  Aortic Valve: The aortic valve was not well visualized. Aortic valve regurgitation is not visualized. No aortic stenosis is present.  Aortic valve mean gradient measures 3.0 mmHg. Aortic valve peak gradient measures 3.9 mmHg. Aortic valve area, by VTI measures 3.60 cm. Pulmonic Valve: The pulmonic valve was not well visualized. Pulmonic valve regurgitation is not visualized. Aorta: The aortic root and ascending aorta are structurally normal, with no evidence of dilitation. Venous: The inferior vena cava was not well visualized. IAS/Shunts: The interatrial septum was not well visualized.  LEFT VENTRICLE PLAX 2D LVIDd:         4.40 cm   Diastology LV PW:         1.30 cm   LV e' medial:    3.48 cm/s LV IVS:        1.10 cm   LV E/e' medial:  16.3 LVOT diam:     2.30 cm   LV e' lateral:   7.72 cm/s LV SV:         74        LV E/e' lateral: 7.3 LV SV Index:   42 LVOT Area:     4.15 cm  RIGHT VENTRICLE RV Basal diam:  2.70 cm RV S prime:     12.80 cm/s TAPSE (M-mode): 1.4 cm LEFT ATRIUM             Index        RIGHT ATRIUM           Index LA diam:        4.20 cm 2.39 cm/m   RA Area:     12.60 cm LA Vol (A2C):   19.7 ml 11.20 ml/m  RA Volume:   29.70 ml  16.89 ml/m LA Vol (A4C):   38.9 ml 22.12 ml/m LA Biplane Vol: 30.7 ml 17.46 ml/m  AORTIC VALVE AV Area (Vmax):    3.35 cm AV Area (Vmean):   3.48 cm AV Area (VTI):     3.60 cm AV Vmax:           99.10 cm/s AV Vmean:          74.900 cm/s AV VTI:            0.204 m AV Peak Grad:      3.9 mmHg AV Mean Grad:      3.0 mmHg LVOT Vmax:         80.00 cm/s LVOT Vmean:        62.800 cm/s LVOT VTI:          0.177 m LVOT/AV VTI ratio: 0.87  AORTA Ao Root diam: 2.70 cm Ao Asc diam:  2.90 cm MITRAL VALVE MV Area (PHT): 3.19 cm    SHUNTS MV Decel Time: 238 msec    Systemic VTI:  0.18 m MV E velocity: 56.70 cm/s  Systemic Diam: 2.30 cm MV A velocity: 96.20 cm/s MV E/A ratio:  0.59 Buford Dresser MD Electronically signed by Buford Dresser MD Signature Date/Time:  06/08/2022/6:43:11 PM    Final    EEG adult  Result Date: 06/08/2022 Lora Havens, MD     06/08/2022  5:14 AM Patient Name: GENESI STEFANKO MRN: [id number] Epilepsy Attending: Lora Havens Referring Physician/Provider: Derek Jack, MD Date: 06/08/2022 Duration: 21.48 mins Patient history: 81yo F with ams. EEG to evaluate for seizure. Level of alertness: Awake AEDs during EEG study: Ativan, Librium Technical aspects: This EEG study was done with scalp electrodes positioned according to the 10-20 International system of electrode placement. Electrical activity was reviewed with band pass filter of 1-'70Hz'$ , sensitivity of 7 uV/mm, display  speed of 31m/sec with a '60Hz'$  notched filter applied as appropriate. EEG data were recorded continuously and digitally stored.  Video monitoring was available and reviewed as appropriate. Description: No posterior dominant rhythm was seen. There was an excessive amount of 15 to 18 Hz beta activity distributed symmetrically and diffusely.  Hyperventilation and photic stimulation were not performed.   ABNORMALITY - Excessive beta, generalized IMPRESSION: This study is within normal limits. The excessive beta activity seen in the background is most likely due to the effect of benzodiazepine and is a benign EEG pattern. No seizures or epileptiform discharges were seen throughout the recording. A normal interictal EEG does not exclude nor support the diagnosis of epilepsy. PLora Havens  MR BRAIN W WO CONTRAST  Result Date: 06/08/2022 CLINICAL DATA:  Transient ischemic attack.  New onset seizure. EXAM: MRI HEAD WITHOUT AND WITH CONTRAST MRA HEAD WITHOUT CONTRAST MRA NECK WITHOUT AND WITH CONTRAST TECHNIQUE: Multiplanar, multiecho pulse sequences of the brain and surrounding structures were obtained without and with intravenous contrast. Angiographic images of the Circle of Willis were obtained using MRA technique without intravenous contrast. Angiographic images of the  neck were obtained using MRA technique without and with intravenous contrast. Carotid stenosis measurements (when applicable) are obtained utilizing NASCET criteria, using the distal internal carotid diameter as the denominator. CONTRAST:  6.5 mL gadopiclenol (Vueway) 0.5 mmol/ml solution COMPARISON:  None Available. FINDINGS: MRI HEAD FINDINGS Brain: No acute infarct, mass effect or extra-axial collection. No acute or chronic hemorrhage. There is multifocal hyperintense T2-weighted signal within the white matter. Generalized volume loss. The midline structures are normal. There is no abnormal contrast enhancement. Vascular: Major flow voids are preserved. Skull and upper cervical spine: Normal calvarium and skull base. Visualized upper cervical spine and soft tissues are normal. Sinuses/Orbits:No paranasal sinus fluid levels or advanced mucosal thickening. No mastoid or middle ear effusion. Normal orbits. MRA HEAD FINDINGS POSTERIOR CIRCULATION: --Vertebral arteries: Normal --Inferior cerebellar arteries: Normal. --Basilar artery: Normal. --Superior cerebellar arteries: Normal. --Posterior cerebral arteries: Normal. ANTERIOR CIRCULATION: --Intracranial internal carotid arteries: Normal. --Anterior cerebral arteries (ACA): Normal. --Middle cerebral arteries (MCA): At the right MCA bifurcation, there is a laterally projecting outpouching that measures 2 mm. This may be a small aneurysm or a chronically occluded branch vessel. Otherwise, the middle cerebral arteries are normal. ANATOMIC VARIANTS: None MRA NECK FINDINGS Left dominant vertebral arteries are patent and of normal caliber. Both carotid systems are normal without stenosis. IMPRESSION: 1. No acute intracranial abnormality. 2. 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 3. No emergent large vessel occlusion or high-grade stenosis. 4. A 2 mm laterally projecting outpouching at the right MCA bifurcation may be a small aneurysm or an occluded branch  vessel. Given the lack of acute ischemia, this is unlikely to be an acute occlusion. Electronically Signed   By: KUlyses JarredM.D.   On: 06/08/2022 01:42   MR ANGIO HEAD WO CONTRAST  Result Date: 06/08/2022 CLINICAL DATA:  Transient ischemic attack.  New onset seizure. EXAM: MRI HEAD WITHOUT AND WITH CONTRAST MRA HEAD WITHOUT CONTRAST MRA NECK WITHOUT AND WITH CONTRAST TECHNIQUE: Multiplanar, multiecho pulse sequences of the brain and surrounding structures were obtained without and with intravenous contrast. Angiographic images of the Circle of Willis were obtained using MRA technique without intravenous contrast. Angiographic images of the neck were obtained using MRA technique without and with intravenous contrast. Carotid stenosis measurements (when applicable) are obtained utilizing NASCET criteria, using the distal internal carotid diameter as the  denominator. CONTRAST:  6.5 mL gadopiclenol (Vueway) 0.5 mmol/ml solution COMPARISON:  None Available. FINDINGS: MRI HEAD FINDINGS Brain: No acute infarct, mass effect or extra-axial collection. No acute or chronic hemorrhage. There is multifocal hyperintense T2-weighted signal within the white matter. Generalized volume loss. The midline structures are normal. There is no abnormal contrast enhancement. Vascular: Major flow voids are preserved. Skull and upper cervical spine: Normal calvarium and skull base. Visualized upper cervical spine and soft tissues are normal. Sinuses/Orbits:No paranasal sinus fluid levels or advanced mucosal thickening. No mastoid or middle ear effusion. Normal orbits. MRA HEAD FINDINGS POSTERIOR CIRCULATION: --Vertebral arteries: Normal --Inferior cerebellar arteries: Normal. --Basilar artery: Normal. --Superior cerebellar arteries: Normal. --Posterior cerebral arteries: Normal. ANTERIOR CIRCULATION: --Intracranial internal carotid arteries: Normal. --Anterior cerebral arteries (ACA): Normal. --Middle cerebral arteries (MCA): At the  right MCA bifurcation, there is a laterally projecting outpouching that measures 2 mm. This may be a small aneurysm or a chronically occluded branch vessel. Otherwise, the middle cerebral arteries are normal. ANATOMIC VARIANTS: None MRA NECK FINDINGS Left dominant vertebral arteries are patent and of normal caliber. Both carotid systems are normal without stenosis. IMPRESSION: 1. No acute intracranial abnormality. 2. 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 3. No emergent large vessel occlusion or high-grade stenosis. 4. A 2 mm laterally projecting outpouching at the right MCA bifurcation may be a small aneurysm or an occluded branch vessel. Given the lack of acute ischemia, this is unlikely to be an acute occlusion. Electronically Signed   By: Ulyses Jarred M.D.   On: 06/08/2022 01:42   MR ANGIO NECK W WO CONTRAST  Result Date: 06/08/2022 CLINICAL DATA:  Transient ischemic attack.  New onset seizure. EXAM: MRI HEAD WITHOUT AND WITH CONTRAST MRA HEAD WITHOUT CONTRAST MRA NECK WITHOUT AND WITH CONTRAST TECHNIQUE: Multiplanar, multiecho pulse sequences of the brain and surrounding structures were obtained without and with intravenous contrast. Angiographic images of the Circle of Willis were obtained using MRA technique without intravenous contrast. Angiographic images of the neck were obtained using MRA technique without and with intravenous contrast. Carotid stenosis measurements (when applicable) are obtained utilizing NASCET criteria, using the distal internal carotid diameter as the denominator. CONTRAST:  6.5 mL gadopiclenol (Vueway) 0.5 mmol/ml solution COMPARISON:  None Available. FINDINGS: MRI HEAD FINDINGS Brain: No acute infarct, mass effect or extra-axial collection. No acute or chronic hemorrhage. There is multifocal hyperintense T2-weighted signal within the white matter. Generalized volume loss. The midline structures are normal. There is no abnormal contrast enhancement. Vascular: Major  flow voids are preserved. Skull and upper cervical spine: Normal calvarium and skull base. Visualized upper cervical spine and soft tissues are normal. Sinuses/Orbits:No paranasal sinus fluid levels or advanced mucosal thickening. No mastoid or middle ear effusion. Normal orbits. MRA HEAD FINDINGS POSTERIOR CIRCULATION: --Vertebral arteries: Normal --Inferior cerebellar arteries: Normal. --Basilar artery: Normal. --Superior cerebellar arteries: Normal. --Posterior cerebral arteries: Normal. ANTERIOR CIRCULATION: --Intracranial internal carotid arteries: Normal. --Anterior cerebral arteries (ACA): Normal. --Middle cerebral arteries (MCA): At the right MCA bifurcation, there is a laterally projecting outpouching that measures 2 mm. This may be a small aneurysm or a chronically occluded branch vessel. Otherwise, the middle cerebral arteries are normal. ANATOMIC VARIANTS: None MRA NECK FINDINGS Left dominant vertebral arteries are patent and of normal caliber. Both carotid systems are normal without stenosis. IMPRESSION: 1. No acute intracranial abnormality. 2. 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 3. No emergent large vessel occlusion or high-grade stenosis. 4. A 2 mm laterally projecting outpouching  at the right MCA bifurcation may be a small aneurysm or an occluded branch vessel. Given the lack of acute ischemia, this is unlikely to be an acute occlusion. Electronically Signed   By: Ulyses Jarred M.D.   On: 06/08/2022 01:42   CT HEAD CODE STROKE WO CONTRAST  Result Date: 06/07/2022 CLINICAL DATA:  Code stroke.  Neuro deficit, acute, stroke suspected EXAM: CT HEAD WITHOUT CONTRAST TECHNIQUE: Contiguous axial images were obtained from the base of the skull through the vertex without intravenous contrast. RADIATION DOSE REDUCTION: This exam was performed according to the departmental dose-optimization program which includes automated exposure control, adjustment of the mA and/or kV according to patient  size and/or use of iterative reconstruction technique. COMPARISON:  CT head March 10, 2021. FINDINGS: Brain: No evidence of acute large vascular territory infarction, hemorrhage, hydrocephalus, extra-axial collection or mass lesion/mass effect. Mild for age patchy white matter hypoattenuation, nonspecific but compatible with chronic microvascular ischemic disease. Vascular: No hyperdense vessel identified. Calcific intracranial atherosclerosis. Skull: No acute fracture. Sinuses/Orbits: No acute findings. ASPECTS [facility] Stroke Program Early CT Score) total score (0-10 with 10 being normal): 10. IMPRESSION: 1. No evidence of acute intracranial abnormality. 2. ASPECTS is 10. Code stroke imaging results were communicated on 06/07/2022 at 4:52 pm to provider Dr. Quinn Axe Via secure text paging. Electronically Signed   By: Margaretha Sheffield M.D.   On: 06/07/2022 16:52    Assessment/Plan 1. Altered mental status, unspecified altered mental status type Resulted in hospitalization but probably related to medications.  Some medicines were discontinued.  She seems stable on current regimen  2. Stage 3b chronic kidney disease (HCC) Recent creatinine was 0.95 with calcium 14 3. Confusion At today's visit, patient very lucid and coherent  4. 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 Essential hypertension Blood pressure good on diuretic  6. Gastroesophageal reflux disease without esophagitis Symptoms controlled on famotidine and Maalox    Family/ staff Communication:   Labs/tests ordered:  .smmsig

## 2022-08-22 ENCOUNTER — Other Ambulatory Visit: Payer: Self-pay | Admitting: Adult Health

## 2022-08-22 MED ORDER — CLONAZEPAM 0.25 MG PO TBDP: 0.2500 mg | ORAL_TABLET | Freq: Two times a day (BID) | ORAL | 0 refills | Status: DC

## 2022-09-03 ENCOUNTER — Non-Acute Institutional Stay (SKILLED_NURSING_FACILITY): Payer: Medicare Other | Admitting: Nurse Practitioner

## 2022-09-03 ENCOUNTER — Encounter: Payer: Self-pay | Admitting: Nurse Practitioner

## 2022-09-03 DIAGNOSIS — K219 Gastro-esophageal reflux disease without esophagitis: Secondary | ICD-10-CM

## 2022-09-03 DIAGNOSIS — J309 Allergic rhinitis, unspecified: Secondary | ICD-10-CM

## 2022-09-03 DIAGNOSIS — M8000XA Age-related osteoporosis with current pathological fracture, unspecified site, initial encounter for fracture: Secondary | ICD-10-CM

## 2022-09-03 DIAGNOSIS — F323 Major depressive disorder, single episode, severe with psychotic features: Secondary | ICD-10-CM

## 2022-09-03 DIAGNOSIS — F0390 Unspecified dementia without behavioral disturbance: Secondary | ICD-10-CM

## 2022-09-03 DIAGNOSIS — I10 Essential (primary) hypertension: Secondary | ICD-10-CM

## 2022-09-03 DIAGNOSIS — M159 Polyosteoarthritis, unspecified: Secondary | ICD-10-CM

## 2022-09-03 DIAGNOSIS — K5909 Other constipation: Secondary | ICD-10-CM | POA: Diagnosis not present

## 2022-09-03 NOTE — Assessment & Plan Note (Signed)
Stable, on prn Dulcolax po/suppository, prn MOM, MiraLax qd/bid alternative.

## 2022-09-03 NOTE — Assessment & Plan Note (Signed)
stabilized, resumed Amitriptyline at 50mg qd-failed GDR, off Librium, continued Fluphenazine, on Clonazepam, TSH 1.62 08/15/21 

## 2022-09-03 NOTE — Progress Notes (Signed)
Location:   [place] Room Number: [place] of Service:  SNF (31) Provider:  Abyan Cadman X, NP  [name], MD  Patient Care Team: [name], MD as PCP - General (Internal Medicine) [name], MD (Ophthalmology) [name], MD (Psychiatry) Rock Sobol X, NP as Nurse Practitioner (Nurse Practitioner) [name], MD as Consulting Physician (Orthopedic Surgery)  Extended Emergency Contact Information Primary Emergency Contact: [name], [place] Work Phone: [phone] Relation: Son Secondary Emergency Contact: [name] [place] Phone: [phone] Relation: Sister  Code Status:  DNR Goals of care: Advanced Directive information    09/03/2022   10:00 AM  Advanced Directives  Does Patient Have a Medical Advance Directive? Yes  Type of Paramedic of [place];Living will;Out of facility DNR (pink MOST or yellow form)  Does patient want to make changes to medical advance directive? No - Patient declined  Copy of [place] in Chart? Yes - validated most recent copy scanned in chart (See row information)  Pre-existing out of facility DNR order (yellow form or pink MOST form) Pink MOST form placed in chart (order not valid for inpatient use);Yellow form placed in chart (order not valid for inpatient use)     Chief Complaint  Patient presents with  . Medical Management of Chronic Issues    Routine follow up visit.  . Immunizations    Flu and current COVID vaccine due  . Quality Metric Gaps    Medicare Annual wellness due    HPI:  Pt is a 81 y.o. female seen today for medical management of chronic diseases.    HTN, blood pressure is controlled on diuretics. Bun/creat 14/0.95 06/09/22             OA on Tylenol, ambulates with walker.              GERD, stable, on Famotidine, Maalox qd/prn, Hgb 13.4 06/09/22              Depression/anxiety/hallucination, stabilized, resumed Amitriptyline at '50mg'$  qd-failed GDR, off Librium, continued Fluphenazine, on Clonazepam, TSH 1.62 08/15/21             Dementia, supportive care in memory care unit [facility] since Flu 08/2021. CT head was negative for acute process. Taking Memantine.              Chronic constipation, on prn Dulcolax po/suppository, prn MOM, MiraLax qd/bid alternative.              Allergic rhinitis, takes Claritin, Flonase, Mucinex        OP, stopped taking Prolia due to cost, ? Allergic to Fosamax.  her son declined DEXA     Past Medical History:  Diagnosis Date  . Allergy   . Anemia   . Anxiety   . Asthma   . CAD (coronary artery disease)   . Depression   . Diverticulosis   . Dysfunction of eustachian tube   . Elevated LFTs   . Esophageal reflux   . Esophageal stricture 2002  . Hemorrhoids   . History of rectal polyps   . HLD (hyperlipidemia)   . HTN (hypertension)   . Hypertonicity of bladder   . Irritable bowel syndrome with constipation   . Obesity   . Optic neuritis   . Osteoporosis   . Peptic ulcer disease   . Peripheral neuropathy   .  Trigeminal neuralgia    prior injury  . 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 Vitamin D deficiency    Past Surgical History:  Procedure Laterality Date  . ABDOMINAL HYSTERECTOMY    . ANAL RECTAL MANOMETRY N/A 07/05/2014   Procedure: ANO RECTAL MANOMETRY;  Surgeon: Leighton Ruff, MD;  Location: [facility];  Service: Endoscopy;  Laterality: N/A;  . APPENDECTOMY    . CAROTID ENDARTERECTOMY    . COLONOSCOPY  2008, 2015  . TONSILLECTOMY    . UPPER GASTROINTESTINAL ENDOSCOPY  2002    Allergies  Allergen Reactions  . Biaxin [Clarithromycin]   . Clarithromycin   . Doxycycline   . Fosamax [Alendronate Sodium]   . Geodon [Ziprasidone Hydrochloride]   . Lipitor [Atorvastatin Calcium]   . Penicillins   . Pravachol [Pravastatin Sodium]    . Statins   . Sulfonamide Derivatives   . Zithromax [Azithromycin]   . Zocor [Simvastatin]     Allergies as of 09/03/2022       Reactions   Biaxin [clarithromycin]    Clarithromycin    Doxycycline    Fosamax [alendronate Sodium]    Geodon [ziprasidone Hydrochloride]    Lipitor [atorvastatin Calcium]    Penicillins    Pravachol [pravastatin Sodium]    Statins    Sulfonamide Derivatives    Zithromax [azithromycin]    Zocor [simvastatin]         Medication List        Accurate as of September 03, 2022  1:55 PM. If you have any questions, ask your nurse or doctor.          acetaminophen 500 MG tablet Commonly known as: TYLENOL Take 1,000 mg by mouth every 8 (eight) hours as needed for fever, headache or mild pain.   alum & mag hydroxide-simeth 200-200-20 MG/5ML suspension Commonly known as: MAALOX/MYLANTA Take 30 mLs by mouth every 4 (four) hours as needed for indigestion or heartburn.   amitriptyline 50 MG tablet Commonly known as: ELAVIL Take 50 mg by mouth daily.   ARTIFICIAL TEAR OP Apply 1 drop to eye 3 (three) times daily.   aspirin 81 MG chewable tablet Chew 81 mg by mouth daily.   bisacodyl 10 MG suppository Commonly known as: DULCOLAX Place 10 mg rectally daily as needed for moderate constipation.   bisacodyl 5 MG EC tablet Commonly known as: DULCOLAX Take 5 mg by mouth daily as needed for moderate constipation.   carbamide peroxide 6.5 % OTIC solution Commonly known as: DEBROX 5 drops as needed (impaction). 5 drops to affected ear, otic (ear). At bedtime, PRN, Identify cerumen impaction by direct visualization by use of otoscope. Place 5 drops to affected ear QHS x3 nights then irrigate with bulb syringe. If not effective notify MD.   clonazePAM 0.25 MG disintegrating tablet Commonly known as: KLONOPIN Take 1 tablet (0.25 mg total) by mouth 2 (two) times daily. Morning and evening   diclofenac Sodium 1 % Gel Commonly known as:  VOLTAREN Apply 2 g topically 2 (two) times daily as needed (pain). apply to lower back area   famotidine 20 MG tablet Commonly known as: PEPCID Take 20 mg by mouth at bedtime.   fluPHENAZine 1 MG tablet Commonly known as: PROLIXIN Take 1 mg by mouth at bedtime.   furosemide 20 MG tablet Commonly known as: LASIX Take 20 mg by mouth daily.   guaiFENesin 600 MG 12 hr tablet Commonly known as: MUCINEX  Take 600 mg by mouth daily as needed (Cold or cogestion).   hydrocortisone valerate cream 0.2 % Commonly known as: WESTCORT Apply 1 application  topically 2 (two) times daily. APPLY TO RASH ON FACE AND NECK TWICE DAILY AS NEEDED   ketoconazole 2 % shampoo Commonly known as: NIZORAL Apply 1 application. topically 2 (two) times a week. Tuesday and Friday   ketoconazole 2 % cream Commonly known as: NIZORAL Apply 1 application  topically 2 (two) times daily. APPLY TO RASH ON FACE AND NECK TWICE DAILY AS NEEDED   loratadine 10 MG tablet Commonly known as: CLARITIN Take 10 mg by mouth daily.   magnesium hydroxide 400 MG/5ML suspension Commonly known as: MILK OF MAGNESIA Take 30 mLs by mouth daily as needed for mild constipation.   memantine 10 MG tablet Commonly known as: NAMENDA Take 10 mg by mouth 2 (two) times daily.   MIRALAX PO Take 17 g by mouth See admin instructions. Take 17 gm, oral twice a day, every other day. Miralax 17 gm + 4-8oz fluid mizture BID   ORAJEL MT Use as directed in the mouth or throat. 20-0.26-0.15 %; amt: SMALL AMOUNT Three Times A Day - PRN;  mucous membrane Special Instructions: Apply to right inner mouth sore TID PRN [DX: Other specified disorders of teeth and supporting structures]  Every 4 Hours - PRN;may self-administer   OYSTER SHELL PO Take 250 mg by mouth in the morning and at bedtime.   saccharomyces boulardii 250 MG capsule Commonly known as: FLORASTOR Take 250 mg by mouth daily.   Sentry Senior Tabs Take 1 tablet by mouth daily.  500-300-250 mcg [DX: Vitamin deficiency, unspecified]   spironolactone 25 MG tablet Commonly known as: ALDACTONE Take 12.5 mg by mouth daily.   triamcinolone cream 0.1 % Commonly known as: KENALOG Apply 1 application  topically as needed (rash). Apply BID as needed to rash to chest, behind L ear, left posterior neck.        Review of Systems  Constitutional:  Negative for appetite change, fatigue and fever.  HENT:  Positive for hearing loss. Negative for congestion, rhinorrhea and voice change.   Eyes:  Positive for visual disturbance.       Low vision  Respiratory:  Negative for cough and shortness of breath.   Cardiovascular:  Negative for leg swelling.  Gastrointestinal:  Negative for abdominal pain and constipation.  Genitourinary:  Negative for dysuria, frequency and urgency.  Musculoskeletal:  Positive for arthralgias and gait problem.  Skin:  Negative for rash.  Neurological:  Negative for speech difficulty, weakness and light-headedness.       Memory lapses  Psychiatric/Behavioral:  Positive for confusion. Negative for behavioral problems, hallucinations and sleep disturbance. The patient is not nervous/anxious.     Immunization History  Administered Date(s) Administered  . Influenza Split 07/23/2013  . Influenza Whole 07/05/2009, 06/26/2018  . Influenza-Unspecified 07/29/2014, 06/14/2015, 07/11/2016, 07/15/2017, 06/27/2019, 07/05/2020, 07/13/2021  . Moderna SARS-COV2 Booster Vaccination 02/09/2022  . Moderna Sars-Covid-2 Vaccination 09/26/2019, 10/24/2019, 08/02/2020  . PFIZER(Purple Top)SARS-COV-2 Vaccination 09/26/2019  . PPD Test 06/17/2013  . Pneumococcal Conjugate-13 07/22/2017  . Pneumococcal Polysaccharide-23 05/05/2009  . Td 05/05/2009, 07/16/2019  . Unspecified SARS-COV-2 Vaccination 02/21/2021, 06/13/2021  . Zoster Recombinat (Shingrix) 09/27/2021, 12/05/2021  . Zoster, Live 01/09/2011   Pertinent  Health Maintenance Due  Topic Date Due  . INFLUENZA  VACCINE  04/24/2022  . DEXA SCAN  Completed      06/07/2022    5:37 PM 06/08/2022  4:25 AM 06/08/2022    5:57 PM 06/08/2022   11:00 PM 06/09/2022   10:00 AM  Fall Risk  Patient Fall Risk Level Moderate fall risk High fall risk High fall risk High fall risk High fall risk   Functional Status Survey:    Vitals:   09/03/22 0948  BP: 108/72  Pulse: 67  Resp: 18  Temp: 98.1 F (36.7 C)  SpO2: 95%  Weight: 129 lb 3.2 oz (58.6 kg)  Height: '5\' 5"'$  (1.651 m)   Body mass index is 21.5 kg/m. Physical Exam Constitutional:      Appearance: Normal appearance.  HENT:     Head: Normocephalic.     Nose: Nose normal.     Mouth/Throat:     Mouth: Mucous membranes are moist.  Eyes:     Extraocular Movements: Extraocular movements intact.     Conjunctiva/sclera: Conjunctivae normal.     Pupils: Pupils are equal, round, and reactive to light.     Comments: Legally blind  Cardiovascular:     Rate and Rhythm: Normal rate and regular rhythm.     Heart sounds: No murmur heard.    Comments: DP pulses R+L present.  Pulmonary:     Effort: Pulmonary effort is normal.     Breath sounds: No rales.  Abdominal:     General: Bowel sounds are normal.     Palpations: Abdomen is soft.     Tenderness: There is no abdominal tenderness. There is no right CVA tenderness, left CVA tenderness, guarding or rebound.  Genitourinary:    Comments: From previous examination a pinto bean sized cyst like lump palpated near the clitoris, asymptomatic.  Musculoskeletal:     Cervical back: Normal range of motion and neck supple.     Right lower leg: No edema.     Left lower leg: No edema.  Skin:    General: Skin is warm and dry.  Neurological:     General: No focal deficit present.     Mental Status: She is alert. Mental status is at baseline.     Gait: Gait abnormal.     Comments: Oriented to person, place.   Psychiatric:        Mood and Affect: Mood normal.        Behavior: Behavior normal.         Thought Content: Thought content normal.     Comments: The patient appears at her baseline mentation during my examination.     Labs reviewed: Recent Labs    06/07/22 1641 06/07/22 1646 06/08/22 0032 06/09/22 0504  NA 139 138 139 141  K 4.7 5.0 4.8 4.3  CL 101 103 104 107  CO2 25  --  25 22  GLUCOSE 98 97 102* 99  BUN 19 27* 21 14  CREATININE 1.16* 1.00 0.92 0.95  CALCIUM 9.5  --  9.1 9.4  MG  --   --  2.3 2.1  PHOS  --   --  3.8 3.3   Recent Labs    04/26/22 0000 06/07/22 1641 06/08/22 0032 06/09/22 0504  AST 23 29 34  --   ALT '16 18 19  '$ --   ALKPHOS 130* 100 98  --   BILITOT  --  0.3 0.6  --   PROT  --  7.2 6.7  --   ALBUMIN 4.6 4.0 3.8 3.5   Recent Labs    09/19/21 0000 04/26/22 0000 06/07/22 1641 06/07/22 1646 06/08/22 0032 06/09/22 0504  WBC 10.3  5.9 6.4  --  9.2 7.4  NEUTROABS 6,365.00 3,039.00 3.1  --   --   --   HGB 11.9* 13.6 15.1* 16.3* 12.8 13.4  HCT 37 40 46.3* 48.0* 39.3 39.7  MCV  --   --  96.9  --  95.6 95.4  PLT 465* 353 303  --  333 304   Lab Results  Component Value Date   TSH 1.62 08/15/2021   Lab Results  Component Value Date   HGBA1C 5.6 06/08/2022   Lab Results  Component Value Date   CHOL 201 (H) 06/08/2022   HDL 71 06/08/2022   LDLCALC 113 (H) 06/08/2022   LDLDIRECT 190.0 03/24/2012   TRIG 84 06/08/2022   CHOLHDL 2.8 06/08/2022    Significant Diagnostic Results in last 30 days:  No results found.  Assessment/Plan No problem-specific Assessment & Plan notes found for this encounter.     Family/ staff Communication: plan of care reviewed with the patient and charge nurse.   Labs/tests ordered:  none  Time spend 35 minutes.

## 2022-09-03 NOTE — Assessment & Plan Note (Signed)
stopped taking Prolia due to cost, ? Allergic to Fosamax.  her son declined DEXA

## 2022-09-03 NOTE — Assessment & Plan Note (Signed)
on Tylenol, ambulates with walker 

## 2022-09-03 NOTE — Assessment & Plan Note (Signed)
stable, on Famotidine, Maalox qd/prn, Hgb 13.4 06/09/22

## 2022-09-03 NOTE — Assessment & Plan Note (Signed)
blood pressure is controlled on diuretics. Bun/creat 14/0.95 06/09/22

## 2022-09-03 NOTE — Assessment & Plan Note (Signed)
takes Claritin, Flonase, Mucinex

## 2022-09-03 NOTE — Assessment & Plan Note (Signed)
supportive care in memory care unit [facility] since Flu 08/2021. CT head was negative for acute process. Taking Memantine.

## 2022-10-08 ENCOUNTER — Encounter: Payer: Self-pay | Admitting: Nurse Practitioner

## 2022-10-08 ENCOUNTER — Non-Acute Institutional Stay (SKILLED_NURSING_FACILITY): Payer: Medicare Other | Admitting: Nurse Practitioner

## 2022-10-08 DIAGNOSIS — I10 Essential (primary) hypertension: Secondary | ICD-10-CM | POA: Diagnosis not present

## 2022-10-08 DIAGNOSIS — F0390 Unspecified dementia without behavioral disturbance: Secondary | ICD-10-CM

## 2022-10-08 DIAGNOSIS — F323 Major depressive disorder, single episode, severe with psychotic features: Secondary | ICD-10-CM | POA: Diagnosis not present

## 2022-10-08 DIAGNOSIS — M159 Polyosteoarthritis, unspecified: Secondary | ICD-10-CM | POA: Diagnosis not present

## 2022-10-08 DIAGNOSIS — K5909 Other constipation: Secondary | ICD-10-CM

## 2022-10-08 DIAGNOSIS — K219 Gastro-esophageal reflux disease without esophagitis: Secondary | ICD-10-CM

## 2022-10-08 NOTE — Assessment & Plan Note (Signed)
supportive care in memory care unit [facility] since Flu 08/2021. CT head was negative for acute process. Taking Memantine.

## 2022-10-08 NOTE — Progress Notes (Signed)
Location:  [place] Room Number: 101-A Place of Service:  SNF [phone]) Provider:  Rankin Lions, MD  Patient Care Team: [name], MD as PCP - General (Internal Medicine) [name], MD (Ophthalmology) [name], MD (Psychiatry) Huberta Tompkins X, NP as Nurse Practitioner (Nurse Practitioner) [name], MD as Consulting Physician (Orthopedic Surgery)  Extended Emergency Contact Information Primary Emergency Contact: [name], [place] Work Phone: [phone] Relation: Son Secondary Emergency Contact: [name] [place] Phone: [phone] Relation: Sister  Code Status:  DNR Goals of care: Advanced Directive information    10/08/2022    9:46 AM  Advanced Directives  Does Patient Have a Medical Advance Directive? Yes  Type of Paramedic of [place];Living will;Out of facility DNR (pink MOST or yellow form)  Does patient want to make changes to medical advance directive? No - Patient declined  Copy of [place] in Chart? Yes - validated most recent copy scanned in chart (See row information)  Pre-existing out of facility DNR order (yellow form or pink MOST form) Pink MOST/Yellow Form most recent copy in chart - Physician notified to receive inpatient order     Chief Complaint  Patient presents with   Acute Visit    Routine    HPI:  Pt is a 82 y.o. female seen today for medical management of chronic diseases.    HTN, blood pressure is controlled on diuretics. Bun/creat 14/0.95 06/09/22             OA on Tylenol, ambulates with walker.              GERD, stable, on Famotidine, Maalox qd/prn, Hgb 13.4 06/09/22             Depression/anxiety/hallucination, stabilized, resumed Amitriptyline at '50mg'$  qd-failed GDR, off Librium, continued Fluphenazine, on Clonazepam, TSH 1.62 08/15/21             Dementia,  supportive care in memory care unit [facility] since Flu 08/2021. CT head was negative for acute process. Taking Memantine.              Chronic constipation, on prn Dulcolax po/suppository, prn MOM, MiraLax qd/bid alternative.              Allergic rhinitis, takes Claritin, Flonase, Mucinex        OP, stopped taking Prolia due to cost, ? Allergic to Fosamax.  her son declined DEXA     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, 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   Vitamin D deficiency    Past Surgical History:  Procedure Laterality Date   ABDOMINAL HYSTERECTOMY     ANAL RECTAL MANOMETRY N/A 07/05/2014   Procedure: ANO RECTAL MANOMETRY;  Surgeon: Leighton Ruff, MD;  Location: [facility];  Service: Endoscopy;  Laterality: N/A;   APPENDECTOMY     CAROTID ENDARTERECTOMY     COLONOSCOPY  2008, 2015   TONSILLECTOMY     UPPER GASTROINTESTINAL ENDOSCOPY  2002    Allergies  Allergen Reactions   Biaxin [Clarithromycin]    Clarithromycin    Doxycycline    Fosamax [Alendronate Sodium]    Geodon [Ziprasidone Hydrochloride]    Lipitor [Atorvastatin Calcium]    Penicillins    Pravachol [Pravastatin Sodium]    Statins    Sulfonamide Derivatives    Zithromax [Azithromycin]    Zocor [Simvastatin]     Outpatient Encounter Medications as of 10/08/2022  Medication Sig   acetaminophen (TYLENOL) 500 MG tablet Take 1,000 mg by  mouth every 8 (eight) hours as needed for fever, headache or mild pain. Give 500 mg by mouth three times a day related to PAIN, UNSPECIFIED   alum & mag hydroxide-simeth (MAALOX/MYLANTA) 200-200-20 MG/5ML suspension Take 30 mLs by mouth every 4 (four) hours as needed for indigestion or heartburn. Give 30 ml orally at bedtime   amitriptyline (ELAVIL) 50 MG tablet Take 50 mg by mouth daily.   Artificial Tears ophthalmic solution Place 1 drop into both eyes 3 (three) times daily. 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   Benzocaine (ORAJEL MT) Use as directed in the mouth or throat. 20-0.26-0.15 %; amt: SMALL AMOUNT Three Times A Day - PRN;  mucous membrane Special Instructions: Apply to right inner mouth sore TID PRN [DX: Other specified disorders of teeth and supporting structures]  Every 4 Hours - PRN;may self-administer   bisacodyl (DULCOLAX) 10 MG suppository Place 10 mg rectally daily as needed for moderate constipation.   bisacodyl (DULCOLAX) 5 MG EC tablet Take 5 mg by mouth daily as needed for moderate constipation.   carbamide peroxide (DEBROX) 6.5 % OTIC solution 5 drops as needed (impaction). 5 drops to affected ear, otic (ear). At bedtime, PRN, Identify cerumen impaction by direct visualization by use of otoscope. Place 5 drops to affected ear QHS x3 nights then irrigate with bulb syringe. If not effective notify MD.   clonazePAM (KLONOPIN) 0.25 MG disintegrating tablet Take 1 tablet (0.25 mg total) by mouth 2 (two) times daily. Morning and evening   diclofenac Sodium (VOLTAREN) 1 % GEL Apply 2 g topically 2 (two) times daily as needed (pain). apply to lower back area   famotidine (PEPCID) 20 MG tablet Take 20 mg by mouth at bedtime.   fluPHENAZine (PROLIXIN) 1 MG tablet Take 1 mg by mouth at bedtime.   furosemide (LASIX) 20 MG tablet Take 20 mg by mouth daily.   guaiFENesin (MUCINEX) 600 MG 12 hr tablet Take 600  mg by mouth daily as needed (Cold or cogestion).   hydrocortisone valerate cream (WESTCORT) 0.2 % Apply to rash on face and neck topically every 12 hours as needed for Rash related to Rash and other nonspecific skin eruption   ketoconazole (NIZORAL) 2 % cream Apply 1 application  topically 2 (two) times daily. APPLY TO RASH ON FACE AND NECK TWICE DAILY AS NEEDED   ketoconazole (NIZORAL) 2 % shampoo Apply 1 application. topically 2 (two) times a week. Tuesday and Friday   loratadine (CLARITIN) 10 MG tablet  Take 10 mg by mouth daily.   magnesium hydroxide (MILK OF MAGNESIA) 400 MG/5ML suspension Take 30 mLs by mouth daily as needed for mild constipation.   memantine (NAMENDA) 10 MG tablet Take 10 mg by mouth 2 (two) times daily.   Multiple Vitamins-Minerals (SENTRY SENIOR) TABS Take 1 tablet by mouth daily. 500-300-250 mcg [DX: Vitamin deficiency, unspecified]   OYSTER SHELL PO Take 250 mg by mouth in the morning and at bedtime.   Polyethylene Glycol 3350 (MIRALAX PO) Take 17 g by mouth See admin instructions. Take 17 gm, oral twice a day, every other day. Miralax 17 gm + 4-8oz fluid mizture BID   saccharomyces boulardii (FLORASTOR) 250 MG capsule Take 250 mg by mouth daily.   spironolactone (ALDACTONE) 25 MG tablet Take 12.5 mg by mouth daily.   triamcinolone cream (KENALOG) 0.1 % Apply 1 application  topically as needed (rash). Apply BID as needed to rash to chest, behind L ear, left posterior neck.   [DISCONTINUED] ARTIFICIAL TEAR OP Apply 1 drop to eye 3 (three) times daily.   [DISCONTINUED] hydrocortisone valerate cream (WESTCORT) 0.2 % Apply 1 application  topically 2 (two) times daily. APPLY TO RASH ON FACE AND NECK TWICE DAILY AS NEEDED   No facility-administered encounter medications on file as of 10/08/2022.    Review of Systems  Constitutional:  Negative for appetite change, fatigue and fever.  HENT:  Positive for hearing loss. Negative for congestion, rhinorrhea and voice change.    Eyes:  Positive for visual disturbance.       Low vision  Respiratory:  Negative for cough and shortness of breath.   Cardiovascular:  Negative for leg swelling.  Gastrointestinal:  Negative for abdominal pain and constipation.  Genitourinary:  Negative for dysuria, frequency and urgency.  Musculoskeletal:  Positive for arthralgias and gait problem.  Skin:  Negative for rash.  Neurological:  Negative for speech difficulty, weakness and light-headedness.       Memory lapses  Psychiatric/Behavioral:  Positive for confusion. Negative for behavioral problems, hallucinations and sleep disturbance. The patient is not nervous/anxious.     Immunization History  Administered Date(s) Administered   Influenza Split 07/23/2013   Influenza Whole 07/05/2009, 06/26/2018   Influenza-Unspecified 07/29/2014, 06/14/2015, 07/11/2016, 07/15/2017, 06/27/2019, 07/05/2020, 07/13/2021   Moderna SARS-COV2 Booster Vaccination 02/09/2022   Moderna Sars-Covid-2 Vaccination 09/26/2019, 10/24/2019, 08/02/2020   PFIZER(Purple Top)SARS-COV-2 Vaccination 09/26/2019   PPD Test 06/17/2013   Pneumococcal Conjugate-13 07/22/2017   Pneumococcal Polysaccharide-23 05/05/2009   Td 05/05/2009, 07/16/2019   Unspecified SARS-COV-2 Vaccination 02/21/2021, 06/13/2021   Zoster Recombinat (Shingrix) 09/27/2021, 12/05/2021   Zoster, Live 01/09/2011   Pertinent  Health Maintenance Due  Topic Date Due   INFLUENZA VACCINE  04/24/2022   DEXA SCAN  Completed      06/07/2022    5:37 PM 06/08/2022    4:25 AM 06/08/2022    5:57 PM 06/08/2022   11:00 PM 06/09/2022   10:00 AM  Fall Risk  (RETIRED) Patient Fall Risk Level Moderate fall risk High fall risk High fall risk High fall risk High fall risk   Functional Status Survey:    Vitals:   10/08/22 0925  BP: 130/76  Pulse: 81  Resp: 17  Temp: (!) 97.3 F (36.3 C)  SpO2: 95%  Weight: 129 lb 3.2 oz (58.6 kg)  Height: '5\' 5"'$  (1.651 m)   Body mass index is 21.5  kg/m. Physical Exam Constitutional:      Appearance: Normal appearance.  HENT:  Head: Normocephalic.     Nose: Nose normal.     Mouth/Throat:     Mouth: Mucous membranes are moist.  Eyes:     Extraocular Movements: Extraocular movements intact.     Conjunctiva/sclera: Conjunctivae normal.     Pupils: Pupils are equal, round, and reactive to light.     Comments: Legally blind  Cardiovascular:     Rate and Rhythm: Normal rate and regular rhythm.     Heart sounds: No murmur heard.    Comments: DP pulses R+L present.  Pulmonary:     Effort: Pulmonary effort is normal.     Breath sounds: No rales.  Abdominal:     General: Bowel sounds are normal.     Palpations: Abdomen is soft.     Tenderness: There is no abdominal tenderness. There is no right CVA tenderness, left CVA tenderness, guarding or rebound.  Genitourinary:    Comments: From previous examination a pinto bean sized cyst like lump palpated near the clitoris, asymptomatic.  Musculoskeletal:     Cervical back: Normal range of motion and neck supple.     Right lower leg: No edema.     Left lower leg: No edema.  Skin:    General: Skin is warm and dry.     Comments: [place]-desires dermatology visit.   Neurological:     General: No focal deficit present.     Mental Status: She is alert. Mental status is at baseline.     Gait: Gait abnormal.     Comments: Oriented to person, place.   Psychiatric:        Mood and Affect: Mood normal.        Behavior: Behavior normal.        Thought Content: Thought content normal.     Comments: The patient appears at her baseline mentation during my examination.      Labs reviewed: Recent Labs    06/07/22 1641 06/07/22 1646 06/08/22 0032 06/09/22 0504  NA 139 138 139 141  K 4.7 5.0 4.8 4.3  CL 101 103 104 107  CO2 25  --  25 22  GLUCOSE 98 97 102* 99  BUN 19 27* 21 14  CREATININE 1.16* 1.00 0.92 0.95  CALCIUM 9.5  --  9.1 9.4  MG  --   --  2.3 2.1  PHOS  --   --  3.8 3.3    Recent Labs    04/26/22 0000 06/07/22 1641 06/08/22 0032 06/09/22 0504  AST 23 29 34  --   ALT '16 18 19  '$ --   ALKPHOS 130* 100 98  --   BILITOT  --  0.3 0.6  --   PROT  --  7.2 6.7  --   ALBUMIN 4.6 4.0 3.8 3.5   Recent Labs    04/26/22 0000 06/07/22 1641 06/07/22 1646 06/08/22 0032 06/09/22 0504  WBC 5.9 6.4  --  9.2 7.4  NEUTROABS 3,039.00 3.1  --   --   --   HGB 13.6 15.1* 16.3* 12.8 13.4  HCT 40 46.3* 48.0* 39.3 39.7  MCV  --  96.9  --  95.6 95.4  PLT 353 303  --  333 304   Lab Results  Component Value Date   TSH 1.62 08/15/2021   Lab Results  Component Value Date   HGBA1C 5.6 06/08/2022   Lab Results  Component Value Date   CHOL 201 (H) 06/08/2022   HDL 71 06/08/2022   LDLCALC 113 (H)  06/08/2022   LDLDIRECT 190.0 03/24/2012   TRIG 84 06/08/2022   CHOLHDL 2.8 06/08/2022    Significant Diagnostic Results in last 30 days:  No results found.  Assessment/Plan Essential hypertension blood pressure is controlled on diuretics. 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 CT head was negative for acute process. Taking Memantine.      Family/ staff Communication: plan of care reviewed with the patient and charge nurse.   Labs/tests ordered:  none  Time spend 35 minutes.

## 2022-10-08 NOTE — Assessment & Plan Note (Signed)
blood pressure is controlled on diuretics. Bun/creat 14/0.95 06/09/22

## 2022-10-08 NOTE — Assessment & Plan Note (Signed)
stabilized, resumed Amitriptyline at 50mg qd-failed GDR, off Librium, continued Fluphenazine, on Clonazepam, TSH 1.62 08/15/21 

## 2022-10-08 NOTE — Assessment & Plan Note (Signed)
on prn Dulcolax po/suppository, prn MOM, MiraLax qd/bid alternative.  

## 2022-10-08 NOTE — Assessment & Plan Note (Signed)
on Tylenol, ambulates with walker 

## 2022-10-08 NOTE — Assessment & Plan Note (Signed)
stable, on Famotidine, Maalox qd/prn, Hgb 13.4 06/09/22

## 2022-10-09 ENCOUNTER — Encounter: Payer: Self-pay | Admitting: Nurse Practitioner

## 2022-10-09 ENCOUNTER — Non-Acute Institutional Stay (SKILLED_NURSING_FACILITY): Payer: Medicare Other | Admitting: Nurse Practitioner

## 2022-10-09 DIAGNOSIS — Z0000 Encounter for general adult medical examination without abnormal findings: Secondary | ICD-10-CM | POA: Diagnosis not present

## 2022-10-09 DIAGNOSIS — F0390 Unspecified dementia without behavioral disturbance: Secondary | ICD-10-CM | POA: Diagnosis not present

## 2022-10-09 NOTE — Progress Notes (Addendum)
Subjective:   Jean [name] is a 82 y.o. female who presents for Medicare Annual (Subsequent) preventive examination '@SNF'$  [facility].      Objective:    Today's Vitals   10/09/22 1419 10/11/22 1026  BP: 130/76   Pulse: 81   Resp: 17   Temp: (!) 97.3 F (36.3 C)   SpO2: 95%   Weight: 129 lb 3.2 oz (58.6 kg)   Height: '5\' 5"'$  (1.651 m)   PainSc:  2    Body mass index is 21.5 kg/m.     10/09/2022    2:21 PM 10/08/2022    9:46 AM 10/08/2022    9:29 AM 09/03/2022   10:00 AM 06/18/2022   11:07 AM 06/12/2022   10:46 AM 06/04/2022    2:32 PM  Advanced Directives  Does Patient Have a Medical Advance Directive? Yes Yes Yes Yes  Yes Yes  Type of Paramedic of [place];Living will;Out of facility DNR (pink MOST or yellow form) [place];Living will;Out of facility DNR (pink MOST or yellow form) [place];Living will;Out of facility DNR (pink MOST or yellow form) [place];Living will;Out of facility DNR (pink MOST or yellow form) [place];Living will;Out of facility DNR (pink MOST or yellow form) [place];Living will;Out of facility DNR (pink MOST or yellow form) [place];Living will;Out of facility DNR (pink MOST or yellow form)  Does patient want to make changes to medical advance directive? No - Patient declined No - Patient declined No - Patient declined No - Patient declined  No - Patient declined No - Patient declined  Copy of [place] in Chart? Yes - validated most recent copy scanned in chart (See row information) Yes - validated most recent copy scanned in chart (See row information) Yes - validated most recent copy scanned in chart (See row information) Yes - validated most recent copy scanned in chart (See row information) Yes - validated most recent copy scanned in chart (See row information) Yes - validated  most recent copy scanned in chart (See row information) Yes - validated most recent copy scanned in chart (See row information)  Pre-existing out of facility DNR order (yellow form or pink MOST form) Pink MOST/Yellow Form most recent copy in chart - Physician notified to receive inpatient order Pink MOST/Yellow Form most recent copy in chart - Physician notified to receive inpatient order Pink MOST/Yellow Form most recent copy in chart - Physician notified to receive inpatient order Pink MOST form placed in chart (order not valid for inpatient use);Yellow form placed in chart (order not valid for inpatient use) Pink Most/Yellow Form available - Physician notified to receive inpatient order;Yellow form placed in chart (order not valid for inpatient use) Pink MOST/Yellow Form most recent copy in chart - Physician notified to receive inpatient order Pink MOST/Yellow Form most recent copy in chart - Physician notified to receive inpatient order    Current Medications (verified) Outpatient Encounter Medications as of 10/09/2022  Medication Sig   acetaminophen (TYLENOL) 500 MG tablet Take 1,000 mg by mouth every 8 (eight) hours as needed for fever, headache or mild pain. Give 500 mg by mouth three times a day related to PAIN, UNSPECIFIED   alum & mag hydroxide-simeth (MAALOX/MYLANTA) 200-200-20 MG/5ML suspension Take 30 mLs by mouth every 4 (four) hours as needed for indigestion or heartburn. Give 30 ml orally at bedtime  amitriptyline (ELAVIL) 50 MG tablet Take 50 mg by mouth daily.   Artificial Tears ophthalmic solution Place 1 drop into both eyes 3 (three) times daily. 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   Benzocaine (ORAJEL MT) Use as directed in the mouth or throat. 20-0.26-0.15 %; amt: SMALL AMOUNT Three Times A Day - PRN;  mucous membrane Special Instructions: Apply to right inner mouth sore TID PRN [DX: Other specified  disorders of teeth and supporting structures]  Every 4 Hours - PRN;may self-administer   bisacodyl (DULCOLAX) 10 MG suppository Place 10 mg rectally daily as needed for moderate constipation.   bisacodyl (DULCOLAX) 5 MG EC tablet Take 5 mg by mouth daily as needed for moderate constipation.   carbamide peroxide (DEBROX) 6.5 % OTIC solution 5 drops as needed (impaction). 5 drops to affected ear, otic (ear). At bedtime, PRN, Identify cerumen impaction by direct visualization by use of otoscope. Place 5 drops to affected ear QHS x3 nights then irrigate with bulb syringe. If not effective notify MD.   clonazePAM (KLONOPIN) 0.25 MG disintegrating tablet Take 1 tablet (0.25 mg total) by mouth 2 (two) times daily. Morning and evening   diclofenac Sodium (VOLTAREN) 1 % GEL Apply 2 g topically 2 (two) times daily as needed (pain). apply to lower back area   famotidine (PEPCID) 20 MG tablet Take 20 mg by mouth at bedtime.   fluPHENAZine (PROLIXIN) 1 MG tablet Take 1 mg by mouth at bedtime.   furosemide (LASIX) 20 MG tablet Take 20 mg by mouth daily.   guaiFENesin (MUCINEX) 600 MG 12 hr tablet Take 600 mg by mouth daily as needed (Cold or cogestion).   hydrocortisone valerate cream (WESTCORT) 0.2 % Apply to rash on face and neck topically every 12 hours as needed for Rash related to Rash and other nonspecific skin eruption   ketoconazole (NIZORAL) 2 % cream Apply 1 application  topically 2 (two) times daily. APPLY TO RASH ON FACE AND NECK TWICE DAILY AS NEEDED   ketoconazole (NIZORAL) 2 % shampoo Apply 1 application. topically 2 (two) times a week. Tuesday and Friday   loratadine (CLARITIN) 10 MG tablet Take 10 mg by mouth daily.   magnesium hydroxide (MILK OF MAGNESIA) 400 MG/5ML suspension Take 30 mLs by mouth daily as needed for mild constipation.   memantine (NAMENDA) 10 MG tablet Take 10 mg by mouth 2 (two) times daily.   Multiple Vitamins-Minerals (SENTRY SENIOR) TABS Take 1 tablet by mouth daily.  500-300-250 mcg [DX: Vitamin deficiency, unspecified]   OYSTER SHELL PO Take 250 mg by mouth in the morning and at bedtime.   Polyethylene Glycol 3350 (MIRALAX PO) Take 17 g by mouth See admin instructions. Take 17 gm, oral twice a day, every other day. Miralax 17 gm + 4-8oz fluid mizture BID   saccharomyces boulardii (FLORASTOR) 250 MG capsule Take 250 mg by mouth daily.   spironolactone (ALDACTONE) 25 MG tablet Take 12.5 mg by mouth daily.   triamcinolone cream (KENALOG) 0.1 % Apply 1 application  topically as needed (rash). Apply BID as needed to rash to chest, behind L ear, left posterior neck.   No facility-administered encounter medications on file as of 10/09/2022.    Allergies (verified) Biaxin [clarithromycin], Clarithromycin, Doxycycline, Fosamax [alendronate sodium], Geodon [ziprasidone hydrochloride], Lipitor [atorvastatin calcium], Penicillins, Pravachol [pravastatin sodium], Statins, Sulfonamide derivatives, Zithromax [azithromycin], and Zocor [simvastatin]   History: 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, 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   Vitamin D deficiency    Past Surgical History:  Procedure Laterality Date   ABDOMINAL HYSTERECTOMY     ANAL RECTAL MANOMETRY N/A 07/05/2014   Procedure: ANO RECTAL MANOMETRY;  Surgeon: Leighton Ruff, MD;  Location: [facility];  Service: Endoscopy;  Laterality:  N/A;   APPENDECTOMY     CAROTID ENDARTERECTOMY     COLONOSCOPY  2008, 2015   TONSILLECTOMY     UPPER GASTROINTESTINAL ENDOSCOPY  2002   Family History  Problem Relation Age of Onset   Alzheimer's disease Mother    Coronary artery disease Father    Heart attack Father    Heart disease Father    Anuerysm Father        AAA   Ovarian cancer Sister    Lung cancer Brother    Heart disease Brother    Anuerysm Brother        AAA   Diabetes Neg Hx    Colon cancer Neg Hx    Social History   Socioeconomic History   Marital status: Divorced    Spouse name: Not on file   Number of children: 2   Years of education: Not on file   Highest education level: Not on file  Occupational History   Occupation: retired    Fish farm manager: RETIRED  Tobacco Use   Smoking status: Never   Smokeless tobacco: Never  Vaping Use   Vaping Use: Never used  Substance and Sexual Activity   Alcohol use: No    Alcohol/week: 0.0 standard drinks of alcohol   Drug use: No   Sexual activity: Never  Other Topics Concern   Not on file  Social History Narrative   HSG, business college   Married 59-56yr/divorced   2 son-'60; 2 granddaughters   Work: sNetwork engineer travel clerk   [place]   Patient never smoked. Did have second hand smoke exposure childhood and work   Alcohol none   Walks with walker   Social Determinants of Health   Financial Resource Strain: Low Risk  (06/10/2018)   Overall Financial Resource Strain (CARDIA)    Difficulty of Paying Living Expenses: Not hard at all  Food Insecurity: No Food Insecurity (06/10/2018)   Hunger Vital Sign    Worried About Running Out of Food in the Last Year: Never true    Ran Out of Food in the Last Year: Never true  Transportation Needs: No Transportation Needs (06/10/2018)   PRAPARE - THydrologist(Medical): No    Lack of Transportation (Non-Medical): No  Physical Activity:  Sufficiently Active (06/10/2018)   Exercise Vital Sign  Days of Exercise per Week: 5 days    Minutes of Exercise per Session: 30 min  Stress: No Stress Concern Present (06/10/2018)   [place]    Feeling of Stress : Not at all  Social Connections: Moderately Isolated (06/10/2018)   Social Connection and Isolation Panel [NHANES]    Frequency of Communication with Friends and Family: Twice a week    Frequency of Social Gatherings with Friends and Family: Twice a week    Attends Religious Services: Never    Marine scientist or Organizations: No    Attends Music therapist: Never    Marital Status: Divorced    Tobacco Counseling Counseling given: Not Answered   Clinical Intake:  Pre-visit preparation completed: Yes  Pain : 0-10 Pain Score: 2  Pain Type: Chronic pain Pain Location: Knee Pain Orientation: Right, Left, Mid, Lower Pain Descriptors / Indicators: Aching, Dull Pain Onset: More than a month ago Pain Frequency: Intermittent Pain Relieving Factors: Tylenol, rest Effect of Pain on Daily Activities: less walking  Pain Relieving Factors: Tylenol, rest  BMI - recorded: 21.5 Nutritional Status: BMI of 19-24  Normal Nutritional Risks: None Diabetes: No  How often do you need to have someone help you when you read instructions, pamphlets, or other written materials from your doctor or pharmacy?: 4 - Often What is the last grade level you completed in school?: college  Diabetic?no  Interpreter Needed?: No  Information entered by :: Kaidyn Javid Bretta Bang NP   Activities of Daily Living    10/11/2022   10:29 AM  In your present state of health, do you have any difficulty performing the following activities:  Hearing? 0  Vision? 1  Difficulty concentrating or making decisions? 1  Walking or climbing stairs? 1  Dressing or bathing? 1  Doing errands, shopping? 1  Preparing Food and eating  ? N  Using the Toilet? N  In the past six months, have you accidently leaked urine? Y  Do you have problems with loss of bowel control? N  Managing your Medications? Y  Managing your Finances? Y  Housekeeping or managing your Housekeeping? Y    Patient Care Team: [name], MD as PCP - General (Internal Medicine) [name], MD (Ophthalmology) [name], MD (Psychiatry) Lanny Lipkin X, NP as Nurse Practitioner (Nurse Practitioner) [name], MD as Consulting Physician (Orthopedic Surgery)  Indicate any recent Medical Services you may have received from other than [facility] providers in the past year (date may be approximate).     Assessment:   This is a routine wellness examination for Jean [name].  Hearing/Vision screen No results found.  Dietary issues and exercise activities discussed: Current Exercise Habits: The patient does not participate in regular exercise at present, Exercise limited by: neurologic condition(s);orthopedic condition(s);psychological condition(s)   Goals Addressed             This Visit's Progress    Maintain Mobility and Function       Evidence-based guidance:  Acknowledge and validate impact of pain, loss of strength and potential disfigurement (hand osteoarthritis) on mental health and daily life, such as social isolation, anxiety, depression, impaired sexual relationship and   injury from falls.  Anticipate referral to physical or occupational therapy for assessment, therapeutic exercise and recommendation for adaptive equipment or assistive devices; encourage participation.  Assess impact on ability to perform activities of daily living, as well as engage in sports and leisure events or  requirements of work or school.  Provide anticipatory guidance and reassurance about the benefit of exercise to maintain function; acknowledge and normalize fear that exercise may worsen symptoms.  Encourage regular exercise, at least 10 minutes at a time  for 45 minutes per week; consider yoga, water exercise and proprioceptive exercises; encourage use of wearable activity tracker to increase motivation and adherence.  Encourage maintenance or resumption of daily activities, including employment, as pain allows and with minimal exposure to trauma.  Assist patient to advocate for adaptations to the work environment.  Consider level of pain and function, gender, age, lifestyle, patient preference, quality of life, readiness and ?ocapacity to benefit? when recommending patients for orthopaedic surgery consultation.  Explore strategies, such as changes to medication regimen or activity that enables patient to anticipate and manage flare-ups that increase deconditioning and disability.  Explore patient preferences; encourage exposure to a broader range of activities that have been avoided for fear of experiencing pain.  Identify barriers to participation in therapy or exercise, such as pain with activity, anticipated or imagined pain.  Monitor postoperative joint replacement or any preexisting joint replacement for ongoing pain and loss of function; provide social support and encouragement throughout recovery.   Notes:       Depression Screen    06/10/2018    1:48 PM 06/04/2017    3:07 PM 07/29/2014    4:19 PM  PHQ 2/9 Scores  PHQ - 2 Score 0 0 0    Fall Risk    06/10/2018    1:48 PM 06/04/2017    3:07 PM 07/29/2014    4:19 PM  [place] in the past year? No Yes No  Number falls in past yr:  1   Injury with Fall?  No     FALL RISK PREVENTION PERTAINING TO THE HOME:  Any stairs in or around the home? Yes  If so, are there any without handrails? No  Home free of loose throw rugs in walkways, pet beds, electrical cords, etc? Yes  Adequate lighting in your home to reduce risk of falls? Yes   ASSISTIVE DEVICES UTILIZED TO PREVENT FALLS:  Life alert? No  Use of a cane, walker or w/c? Yes  Grab bars in the bathroom? Yes  Shower  chair or bench in shower? Yes  Elevated toilet seat or a handicapped toilet? Yes   TIMED UP AND GO:  Was the test performed? Yes .  Length of time to ambulate 10 feet: 12 sec.   Gait slow and steady with assistive device  Cognitive Function:    10/15/2022   12:29 PM 09/21/2021   10:22 AM 02/07/2018    9:42 AM 06/04/2017    3:09 PM  MMSE - Mini Mental State Exam  Not completed:   Unable to complete    Orientation to time '4 4 5 4  '$ Orientation to Place '4 2 5 5  '$ Registration '3 3 3 3  '$ Attention/ Calculation '3 3 5 5  '$ Recall 0 '2 3 2  '$ Language- name 2 objects '2 2 2 2  '$ Language- repeat '1 1 1 1  '$ Language- follow 3 step command '3 3 3 3  '$ Language- read & follow direction 0 0 0 0  Write a sentence 1 1 0 0  Copy design 0 0 0 0  Total score '21 21 27 25     '$ Significant value        Immunizations Immunization History  Administered Date(s) Administered   Influenza  Split 07/23/2013   Influenza Whole 07/05/2009, 06/26/2018   Influenza-Unspecified 07/29/2014, 06/14/2015, 07/11/2016, 07/15/2017, 06/27/2019, 07/05/2020, 07/13/2021   Moderna SARS-COV2 Booster Vaccination 02/09/2022   Moderna Sars-Covid-2 Vaccination 09/26/2019, 10/24/2019, 08/02/2020   PFIZER(Purple Top)SARS-COV-2 Vaccination 09/26/2019   PPD Test 06/17/2013   Pneumococcal Conjugate-13 07/22/2017   Pneumococcal Polysaccharide-23 05/05/2009   Td 05/05/2009, 07/16/2019   Unspecified SARS-COV-2 Vaccination 02/21/2021, 06/13/2021   Zoster Recombinat (Shingrix) 09/27/2021, 12/05/2021   Zoster, Live 01/09/2011    TDAP status: Up to date  Flu Vaccine status: Up to date  Pneumococcal vaccine status: Up to date  Covid-19 vaccine status: Information provided on how to obtain vaccines.   Qualifies for Shingles Vaccine? Yes   Zostavax completed Yes   Shingrix Completed?: Yes  Screening Tests Health Maintenance  Topic Date Due   INFLUENZA VACCINE  04/24/2022   COVID-19 Vaccine (8 - 2023-24 season) 05/25/2022    Medicare Annual Wellness (AWV)  10/10/2023   DTaP/Tdap/Td (3 - Tdap) 07/15/2029   Pneumonia Vaccine 26+ Years old  Completed   DEXA SCAN  Completed   Zoster Vaccines- Shingrix  Completed   HPV VACCINES  Aged Out    Health Maintenance  Health Maintenance Due  Topic Date Due   INFLUENZA VACCINE  04/24/2022   COVID-19 Vaccine (8 - 2023-24 season) 05/25/2022    Colorectal cancer screening: No longer required.   Mammogram status: No longer required due to aged out.  Bone Density status: Completed 2018. Results reflect: Bone density results: OSTEOPOROSIS. Repeat every 2 years.  Lung Cancer Screening: (Low Dose CT Chest recommended if Age 56-80 years, 30 pack-year currently smoking OR have quit w/in 15years.) does not qualify.    Additional Screening:  Hepatitis C Screening: does not qualify;   Vision Screening: Recommended annual ophthalmology exams for early detection of glaucoma and other disorders of the eye. Is the patient up to date with their annual eye exam?  No  Who is the provider or what is the name of the office in which the patient attends annual eye exams? HPOA will provided If pt is not established with a provider, would they like to be referred to a provider to establish care? No .   Dental Screening: Recommended annual dental exams for proper oral hygiene  Community Resource Referral / Chronic Care Management: CRR required this visit?  No   CCM required this visit?  No      Plan:   Due Eye Exam DEXA if HPOA desires.  Pending MMSE  I have personally reviewed and noted the following in the patient's chart:   Medical and social history Use of alcohol, tobacco or illicit drugs  Current medications and supplements including opioid prescriptions. Patient is not currently taking opioid prescriptions. Functional ability and status Nutritional status Physical activity Advanced directives List of other physicians Hospitalizations, surgeries, and ER visits in  previous 12 months Vitals Screenings to include cognitive, depression, and falls Referrals and appointments  In addition, I have reviewed and discussed with patient certain preventive protocols, quality metrics, and best practice recommendations. A written personalized care plan for preventive services as well as general preventive health recommendations were provided to patient.     Hanzel Pizzo X John Williamsen, NP   10/15/2022

## 2022-10-11 ENCOUNTER — Encounter: Payer: Self-pay | Admitting: Nurse Practitioner

## 2022-11-06 ENCOUNTER — Encounter: Payer: Self-pay | Admitting: Family Medicine

## 2022-11-06 ENCOUNTER — Non-Acute Institutional Stay (SKILLED_NURSING_FACILITY): Payer: Medicare Other | Admitting: Family Medicine

## 2022-11-06 DIAGNOSIS — I10 Essential (primary) hypertension: Secondary | ICD-10-CM | POA: Diagnosis not present

## 2022-11-06 DIAGNOSIS — L309 Dermatitis, unspecified: Secondary | ICD-10-CM | POA: Diagnosis not present

## 2022-11-06 DIAGNOSIS — F0390 Unspecified dementia without behavioral disturbance: Secondary | ICD-10-CM

## 2022-11-06 DIAGNOSIS — F323 Major depressive disorder, single episode, severe with psychotic features: Secondary | ICD-10-CM

## 2022-11-06 DIAGNOSIS — M8000XA Age-related osteoporosis with current pathological fracture, unspecified site, initial encounter for fracture: Secondary | ICD-10-CM | POA: Diagnosis not present

## 2022-11-06 NOTE — Progress Notes (Signed)
This encounter was created in error - please disregard.

## 2022-11-06 NOTE — Progress Notes (Signed)
Provider:  Alain Honey, MD Location:      Place of Service:     PCP: [name], MD Patient Care Team: [name], MD as PCP - General (Internal Medicine) [name], MD (Ophthalmology) [name], MD (Psychiatry) Mast, Man X, NP as Nurse Practitioner (Nurse Practitioner) [name], MD as Consulting Physician (Orthopedic Surgery)  Extended Emergency Contact Information Primary Emergency Contact: [name], [place] Work Phone: [phone] Relation: Son Secondary Emergency Contact: [name] [place] Phone: [phone] Relation: Sister  Code Status:  Goals of Care: Advanced Directive information    10/09/2022    2:21 PM  Advanced Directives  Does Patient Have a Medical Advance Directive? Yes  Type of Paramedic of [place];Living will;Out of facility DNR (pink MOST or yellow form)  Does patient want to make changes to medical advance directive? No - Patient declined  Copy of [place] in Chart? Yes - validated most recent copy scanned in chart (See row information)  Pre-existing out of facility DNR order (yellow form or pink MOST form) Pink MOST/Yellow Form most recent copy in chart - Physician notified to receive inpatient order      No chief complaint on file.   HPI: Patient is a 82 y.o. female seen today for medical management of chronic problems including Alzheimer's dementia, hypertension, depression and anxiety,.  Patient resides in memory care.  At my first visit she seemed a little higher functioning than many of her other residents and the unit but today she seems a little more confused and fixates on a rash on her skin that is not present by my exam.  She is still able to carry on a reasonable conversation.  Nursing reports some decrease in vision in her right eye. There is a history of depression and anxiety managed with  amitriptyline, fluphenazine and clonazepam.  She also takes memantine for her dementia. Appetite and sleep are good.  There have been no recent falls and she demonstrated ability to walk without her walker easily from our common area to her room and back.  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, 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   Vitamin D deficiency    Past Surgical History:  Procedure Laterality Date   ABDOMINAL HYSTERECTOMY     ANAL RECTAL MANOMETRY N/A 07/05/2014   Procedure: ANO RECTAL MANOMETRY;  Surgeon: Leighton Ruff, MD;  Location: [facility];  Service: Endoscopy;  Laterality: N/A;   APPENDECTOMY     CAROTID ENDARTERECTOMY     COLONOSCOPY  2008, 2015   TONSILLECTOMY     UPPER GASTROINTESTINAL ENDOSCOPY  2002    reports that she has never smoked. She has never used smokeless tobacco. She reports that she does not drink alcohol and does not use drugs. Social History   Socioeconomic History   Marital status: Divorced    Spouse name: Not on file   Number of children: 2   Years of education: Not on file   Highest education level: Not on file  Occupational History   Occupation: retired    Fish farm manager: RETIRED  Tobacco Use   Smoking status: Never   Smokeless tobacco: Never  Vaping Use   Vaping Use: Never used  Substance and Sexual Activity   Alcohol use: No    Alcohol/week: 0.0 standard drinks of  alcohol   Drug use: No   Sexual activity: Never  Other Topics Concern   Not on file  Social History Narrative   HSG, business college   Married 59-74yr/divorced   2 son-'60; 2 granddaughters   Work: sNetwork engineer travel clerk   [place]   Patient never smoked. Did have second hand smoke exposure childhood and work   Alcohol none   Walks with walker   Social Determinants of Health   Financial Resource Strain: Low Risk  (06/10/2018)   Overall Financial Resource Strain (CARDIA)    Difficulty of Paying Living Expenses: Not hard at all  Food Insecurity: No Food Insecurity (06/10/2018)   Hunger Vital Sign    Worried About Running Out of Food in the Last Year: Never true    Ran Out of Food in the Last Year: Never true  Transportation Needs: No Transportation Needs (06/10/2018)   PRAPARE - THydrologist(Medical): No    Lack of Transportation (Non-Medical): No  Physical Activity: Sufficiently Active (06/10/2018)   Exercise Vital Sign    Days of Exercise per Week: 5 days    Minutes of Exercise per Session: 30 min  Stress: No Stress Concern Present (06/10/2018)   [place]   Feeling of Stress : Not at all  Social Connections: Moderately Isolated (06/10/2018)   Social Connection and Isolation Panel [NHANES]    Frequency of Communication with Friends and Family: Twice a week    Frequency of Social Gatherings with Friends and Family: Twice a week    Attends Religious Services: Never    AMarine scientistor Organizations: No    Attends CArchivistMeetings: Never    Marital Status: Divorced  IHuman resources officerViolence: Not At Risk (06/10/2018)   Humiliation, Afraid, Rape, and Kick questionnaire    Fear of Current or Ex-Partner: No    Emotionally Abused: No    Physically Abused: No    Sexually Abused: No    Functional Status  Survey:    Family History  Problem Relation Age of Onset   Alzheimer's disease Mother    Coronary artery disease Father    Heart attack Father    Heart disease Father    Anuerysm Father        AAA   Ovarian cancer Sister    Lung cancer Brother    Heart disease Brother    Anuerysm Brother        AAA   Diabetes Neg Hx    Colon cancer Neg Hx     Health Maintenance  Topic Date Due   INFLUENZA VACCINE  04/24/2022   COVID-19 Vaccine (8 - 2023-24  season) 05/25/2022   Medicare Annual Wellness (AWV)  10/10/2023   DTaP/Tdap/Td (3 - Tdap) 07/15/2029   Pneumonia Vaccine 70+ Years old  Completed   DEXA SCAN  Completed   Zoster Vaccines- Shingrix  Completed   HPV VACCINES  Aged Out    Allergies  Allergen Reactions   Biaxin [Clarithromycin]    Clarithromycin    Doxycycline    Fosamax [Alendronate Sodium]    Geodon [Ziprasidone Hydrochloride]    Lipitor [Atorvastatin Calcium]    Penicillins    Pravachol [Pravastatin Sodium]    Statins    Sulfonamide Derivatives    Zithromax [Azithromycin]    Zocor [Simvastatin]     Outpatient Encounter Medications as of 11/06/2022  Medication Sig   acetaminophen (TYLENOL) 500 MG tablet Take 1,000 mg by mouth every 8 (eight) hours as needed for fever, headache or mild pain. Give 500 mg by mouth three times a day related to PAIN, UNSPECIFIED   alum & mag hydroxide-simeth (MAALOX/MYLANTA) 200-200-20 MG/5ML suspension Take 30 mLs by mouth every 4 (four) hours as needed for indigestion or heartburn. Give 30 ml orally at bedtime   amitriptyline (ELAVIL) 50 MG tablet Take 50 mg by mouth daily.   Artificial Tears ophthalmic solution Place 1 drop into both eyes 3 (three) times daily. 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   Benzocaine (ORAJEL MT) Use as directed in the mouth or throat. 20-0.26-0.15 %; amt: SMALL AMOUNT Three Times A Day - PRN;  mucous membrane Special  Instructions: Apply to right inner mouth sore TID PRN [DX: Other specified disorders of teeth and supporting structures]  Every 4 Hours - PRN;may self-administer   bisacodyl (DULCOLAX) 10 MG suppository Place 10 mg rectally daily as needed for moderate constipation.   bisacodyl (DULCOLAX) 5 MG EC tablet Take 5 mg by mouth daily as needed for moderate constipation.   carbamide peroxide (DEBROX) 6.5 % OTIC solution 5 drops as needed (impaction). 5 drops to affected ear, otic (ear). At bedtime, PRN, Identify cerumen impaction by direct visualization by use of otoscope. Place 5 drops to affected ear QHS x3 nights then irrigate with bulb syringe. If not effective notify MD.   clonazePAM (KLONOPIN) 0.25 MG disintegrating tablet Take 1 tablet (0.25 mg total) by mouth 2 (two) times daily. Morning and evening   diclofenac Sodium (VOLTAREN) 1 % GEL Apply 2 g topically 2 (two) times daily as needed (pain). apply to lower back area   famotidine (PEPCID) 20 MG tablet Take 20 mg by mouth at bedtime.   fluPHENAZine (PROLIXIN) 1 MG tablet Take 1 mg by mouth at bedtime.   furosemide (LASIX) 20 MG tablet Take 20 mg by mouth daily.   guaiFENesin (MUCINEX) 600 MG 12 hr tablet Take 600 mg by mouth daily as needed (Cold or cogestion).   hydrocortisone valerate cream (WESTCORT) 0.2 % Apply to rash on face and neck topically every 12 hours as needed for Rash related to Rash and other nonspecific skin eruption   ketoconazole (NIZORAL) 2 % cream Apply 1 application  topically 2 (two) times daily. APPLY TO RASH ON FACE AND NECK TWICE DAILY AS NEEDED   ketoconazole (NIZORAL) 2 % shampoo Apply 1 application. topically 2 (two) times a week. Tuesday and Friday   loratadine (CLARITIN) 10 MG tablet Take 10 mg by mouth daily.   magnesium hydroxide (MILK OF MAGNESIA) 400 MG/5ML suspension Take 30 mLs by mouth  daily as needed for mild constipation.   memantine (NAMENDA) 10 MG tablet Take 10 mg by mouth 2 (two) times daily.    Multiple Vitamins-Minerals (SENTRY SENIOR) TABS Take 1 tablet by mouth daily. 500-300-250 mcg [DX: Vitamin deficiency, unspecified]   OYSTER SHELL PO Take 250 mg by mouth in the morning and at bedtime.   Polyethylene Glycol 3350 (MIRALAX PO) Take 17 g by mouth See admin instructions. Take 17 gm, oral twice a day, every other day. Miralax 17 gm + 4-8oz fluid mizture BID   saccharomyces boulardii (FLORASTOR) 250 MG capsule Take 250 mg by mouth daily.   spironolactone (ALDACTONE) 25 MG tablet Take 12.5 mg by mouth daily.   triamcinolone cream (KENALOG) 0.1 % Apply 1 application  topically as needed (rash). Apply BID as needed to rash to chest, behind L ear, left posterior neck.   No facility-administered encounter medications on file as of 11/06/2022.    Review of Systems  Constitutional: Negative.   HENT:  Positive for congestion.   Eyes:  Positive for visual disturbance.  Respiratory: Negative.    Cardiovascular: Negative.   Neurological: Negative.   Hematological: Negative.   Psychiatric/Behavioral:  The patient is nervous/anxious.   All other systems reviewed and are negative.   There were no vitals filed for this visit. There is no height or weight on file to calculate BMI. Physical Exam Vitals and nursing note reviewed.  Constitutional:      Appearance: Normal appearance.  HENT:     Head: Normocephalic.  Cardiovascular:     Rate and Rhythm: Normal rate and regular rhythm.  Pulmonary:     Effort: Pulmonary effort is normal.     Breath sounds: Normal breath sounds.  Abdominal:     General: Bowel sounds are normal.     Palpations: Abdomen is soft.  Neurological:     General: No focal deficit present.     Mental Status: She is alert.     Comments: Oriented to person and place  Psychiatric:        Mood and Affect: Mood normal.        Behavior: Behavior normal.     Labs reviewed: Basic Metabolic Panel: Recent Labs    06/07/22 1646 06/08/22 0032 06/09/22 0504  06/19/22 0000  NA 138 139 141 138  K 5.0 4.8 4.3 4.0  CL 103 104 107 100  CO2  --  25 22 24*  GLUCOSE 97 102* 99  --   BUN 27* 21 14 21  $ CREATININE 1.00 0.92 0.95 1.0  CALCIUM  --  9.1 9.4 9.4  MG  --  2.3 2.1  --   PHOS  --  3.8 3.3  --    Liver Function Tests: Recent Labs    06/07/22 1641 06/08/22 0032 06/09/22 0504 06/19/22 0000  AST 29 34  --  20  ALT 18 19  --  14  ALKPHOS 100 98  --  123  BILITOT 0.3 0.6  --   --   PROT 7.2 6.7  --   --   ALBUMIN 4.0 3.8 3.5 4.2   No results for input(s): "LIPASE", "AMYLASE" in the last 8760 hours. No results for input(s): "AMMONIA" in the last 8760 hours. CBC: Recent Labs    04/26/22 0000 04/26/22 0000 06/07/22 1641 06/07/22 1646 06/08/22 0032 06/09/22 0504 06/19/22 0000  WBC 5.9   < > 6.4  --  9.2 7.4 7.2  NEUTROABS 3,039.00  --  3.1  --   --   --  5,134.00  HGB 13.6  --  15.1*   < > 12.8 13.4 13.3  HCT 40  --  46.3*   < > 39.3 39.7 40  MCV  --   --  96.9  --  95.6 95.4  --   PLT 353  --  303  --  333 304 362   < > = values in this interval not displayed.   Cardiac Enzymes: No results for input(s): "CKTOTAL", "CKMB", "CKMBINDEX", "TROPONINI" in the last 8760 hours. BNP: Invalid input(s): "POCBNP" Lab Results  Component Value Date   HGBA1C 5.6 06/08/2022   Lab Results  Component Value Date   TSH 1.62 08/15/2021   Lab Results  Component Value Date   VITAMINB12 404 12/31/2010   Lab Results  Component Value Date   FOLATE 12.6 05/11/2010   No results found for: "IRON", "TIBC", "FERRITIN"  Imaging and Procedures obtained prior to SNF admission: ECHOCARDIOGRAM COMPLETE  Result Date: 06/08/2022    ECHOCARDIOGRAM REPORT   Patient Name:   JAYCELYNN DEICHMANN Date of Exam: 06/08/2022 Medical Rec #:  [id number]      Height:       65.0 in Accession #:    [id number]     Weight:       151.7 lb Date of Birth:  1940-10-14     BSA:          1.759 m Patient Age:    43 years       BP:           161/48 mmHg Patient Gender: F               HR:           86 bpm. Exam Location:  Inpatient Procedure: 2D Echo, Cardiac Doppler and Color Doppler Indications:    TIA  History:        Patient has no prior history of Echocardiogram examinations.                 Risk Factors:Dyslipidemia and Hypertension. PVD. CKD.  Sonographer:    Clayton Lefort RDCS (AE) Referring Phys: Derek Jack  Sonographer Comments: Technically difficult study due to poor echo windows. IMPRESSIONS  1. Left ventricular ejection fraction, by estimation, is 55 to 60%. The left ventricle has normal function. Left ventricular endocardial border not optimally defined to evaluate regional wall motion. Left ventricular diastolic parameters are indeterminate.  2. Right ventricular systolic function is normal. The right ventricular size is normal.  3. The mitral valve was not well visualized. No evidence of mitral valve regurgitation. No evidence of mitral stenosis.  4. The aortic valve was not well visualized. Aortic valve regurgitation is not visualized. No aortic stenosis is present. Comparison(s): No prior Echocardiogram. Conclusion(s)/Recommendation(s): Very limited imaged due to poor echo windows. Grossly normal RV and LV function. No severe valve disease by Doppler, but valves not well visualized. Consider TEE if indicated to exclude cardioembolic source. FINDINGS  Left Ventricle: Left ventricular ejection fraction, by estimation, is 55 to 60%. The left ventricle has normal function. Left ventricular endocardial border not optimally defined to evaluate regional wall motion. The left ventricular internal cavity size was normal in size. There is borderline left ventricular hypertrophy. Left ventricular diastolic parameters are indeterminate. Right Ventricle: The right ventricular size is normal. Right vetricular wall thickness was not well visualized. Right ventricular systolic function is normal. Left Atrium: Left atrial size was normal in size. Right Atrium: Right atrial size was  normal in size. Pericardium: There is no evidence of pericardial effusion. Presence of epicardial fat layer. Mitral Valve: The mitral valve was not well visualized. No evidence of mitral valve regurgitation. No evidence of mitral valve stenosis. Tricuspid Valve: The tricuspid valve is not well visualized. Tricuspid valve regurgitation is trivial. No evidence of tricuspid stenosis. Aortic Valve: The aortic valve was not well visualized. Aortic valve regurgitation is not visualized. No aortic stenosis is present. Aortic valve mean gradient measures 3.0 mmHg. Aortic valve peak gradient measures 3.9 mmHg. Aortic valve area, by VTI measures 3.60 cm. Pulmonic Valve: The pulmonic valve was not well visualized. Pulmonic valve regurgitation is not visualized. Aorta: The aortic root and ascending aorta are structurally normal, with no evidence of dilitation. Venous: The inferior vena cava was not well visualized. IAS/Shunts: The interatrial septum was not well visualized.  LEFT VENTRICLE PLAX 2D LVIDd:         4.40 cm   Diastology LV PW:         1.30 cm   LV e' medial:    3.48 cm/s LV IVS:        1.10 cm   LV E/e' medial:  16.3 LVOT diam:     2.30 cm   LV e' lateral:   7.72 cm/s LV SV:         74        LV E/e' lateral: 7.3 LV SV Index:   42 LVOT Area:     4.15 cm  RIGHT VENTRICLE RV Basal diam:  2.70 cm RV S prime:     12.80 cm/s TAPSE (M-mode): 1.4 cm LEFT ATRIUM             Index        RIGHT ATRIUM           Index LA diam:        4.20 cm 2.39 cm/m   RA Area:     12.60 cm LA Vol (A2C):   19.7 ml 11.20 ml/m  RA Volume:   29.70 ml  16.89 ml/m LA Vol (A4C):   38.9 ml 22.12 ml/m LA Biplane Vol: 30.7 ml 17.46 ml/m  AORTIC VALVE AV Area (Vmax):    3.35 cm AV Area (Vmean):   3.48 cm AV Area (VTI):     3.60 cm AV Vmax:           99.10 cm/s AV Vmean:          74.900 cm/s AV VTI:            0.204 m AV Peak Grad:      3.9 mmHg AV Mean Grad:      3.0 mmHg LVOT Vmax:         80.00 cm/s LVOT Vmean:        62.800 cm/s LVOT  VTI:          0.177 m LVOT/AV VTI ratio: 0.87  AORTA Ao Root diam: 2.70 cm Ao Asc diam:  2.90 cm MITRAL VALVE MV Area (PHT): 3.19 cm    SHUNTS MV Decel Time: 238 msec    Systemic VTI:  0.18 m MV E velocity: 56.70 cm/s  Systemic Diam: 2.30 cm MV A velocity: 96.20 cm/s MV E/A ratio:  0.59 Buford Dresser MD Electronically signed by Buford Dresser MD Signature Date/Time: 06/08/2022/6:43:11 PM    Final    EEG adult  Result Date: 06/08/2022 Lora Havens, MD     06/08/2022  5:14 AM Patient Name: Arville Go  JABRIA DOLLEY MRN: [id number] Epilepsy Attending: Lora Havens Referring Physician/Provider: Derek Jack, MD Date: 06/08/2022 Duration: 21.48 mins Patient history: 82yo F with ams. EEG to evaluate for seizure. Level of alertness: Awake AEDs during EEG study: Ativan, Librium Technical aspects: This EEG study was done with scalp electrodes positioned according to the 10-20 International system of electrode placement. Electrical activity was reviewed with band pass filter of 1-70Hz$ , sensitivity of 7 uV/mm, display speed of 29m/sec with a 60Hz$  notched filter applied as appropriate. EEG data were recorded continuously and digitally stored.  Video monitoring was available and reviewed as appropriate. Description: No posterior dominant rhythm was seen. There was an excessive amount of 15 to 18 Hz beta activity distributed symmetrically and diffusely.  Hyperventilation and photic stimulation were not performed.   ABNORMALITY - Excessive beta, generalized IMPRESSION: This study is within normal limits. The excessive beta activity seen in the background is most likely due to the effect of benzodiazepine and is a benign EEG pattern. No seizures or epileptiform discharges were seen throughout the recording. A normal interictal EEG does not exclude nor support the diagnosis of epilepsy. PLora Havens  MR BRAIN W WO CONTRAST  Result Date: 06/08/2022 CLINICAL DATA:  Transient ischemic attack.  New onset  seizure. EXAM: MRI HEAD WITHOUT AND WITH CONTRAST MRA HEAD WITHOUT CONTRAST MRA NECK WITHOUT AND WITH CONTRAST TECHNIQUE: Multiplanar, multiecho pulse sequences of the brain and surrounding structures were obtained without and with intravenous contrast. Angiographic images of the Circle of Willis were obtained using MRA technique without intravenous contrast. Angiographic images of the neck were obtained using MRA technique without and with intravenous contrast. Carotid stenosis measurements (when applicable) are obtained utilizing NASCET criteria, using the distal internal carotid diameter as the denominator. CONTRAST:  6.5 mL gadopiclenol (Vueway) 0.5 mmol/ml solution COMPARISON:  None Available. FINDINGS: MRI HEAD FINDINGS Brain: No acute infarct, mass effect or extra-axial collection. No acute or chronic hemorrhage. There is multifocal hyperintense T2-weighted signal within the white matter. Generalized volume loss. The midline structures are normal. There is no abnormal contrast enhancement. Vascular: Major flow voids are preserved. Skull and upper cervical spine: Normal calvarium and skull base. Visualized upper cervical spine and soft tissues are normal. Sinuses/Orbits:No paranasal sinus fluid levels or advanced mucosal thickening. No mastoid or middle ear effusion. Normal orbits. MRA HEAD FINDINGS POSTERIOR CIRCULATION: --Vertebral arteries: Normal --Inferior cerebellar arteries: Normal. --Basilar artery: Normal. --Superior cerebellar arteries: Normal. --Posterior cerebral arteries: Normal. ANTERIOR CIRCULATION: --Intracranial internal carotid arteries: Normal. --Anterior cerebral arteries (ACA): Normal. --Middle cerebral arteries (MCA): At the right MCA bifurcation, there is a laterally projecting outpouching that measures 2 mm. This may be a small aneurysm or a chronically occluded branch vessel. Otherwise, the middle cerebral arteries are normal. ANATOMIC VARIANTS: None MRA NECK FINDINGS Left dominant  vertebral arteries are patent and of normal caliber. Both carotid systems are normal without stenosis. IMPRESSION: 1. No acute intracranial abnormality. 2. 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 3. No emergent large vessel occlusion or high-grade stenosis. 4. A 2 mm laterally projecting outpouching at the right MCA bifurcation may be a small aneurysm or an occluded branch vessel. Given the lack of acute ischemia, this is unlikely to be an acute occlusion. Electronically Signed   By: KUlyses JarredM.D.   On: 06/08/2022 01:42   MR ANGIO HEAD WO CONTRAST  Result Date: 06/08/2022 CLINICAL DATA:  Transient ischemic attack.  New onset seizure. EXAM: MRI HEAD WITHOUT AND WITH CONTRAST  MRA HEAD WITHOUT CONTRAST MRA NECK WITHOUT AND WITH CONTRAST TECHNIQUE: Multiplanar, multiecho pulse sequences of the brain and surrounding structures were obtained without and with intravenous contrast. Angiographic images of the Circle of Willis were obtained using MRA technique without intravenous contrast. Angiographic images of the neck were obtained using MRA technique without and with intravenous contrast. Carotid stenosis measurements (when applicable) are obtained utilizing NASCET criteria, using the distal internal carotid diameter as the denominator. CONTRAST:  6.5 mL gadopiclenol (Vueway) 0.5 mmol/ml solution COMPARISON:  None Available. FINDINGS: MRI HEAD FINDINGS Brain: No acute infarct, mass effect or extra-axial collection. No acute or chronic hemorrhage. There is multifocal hyperintense T2-weighted signal within the white matter. Generalized volume loss. The midline structures are normal. There is no abnormal contrast enhancement. Vascular: Major flow voids are preserved. Skull and upper cervical spine: Normal calvarium and skull base. Visualized upper cervical spine and soft tissues are normal. Sinuses/Orbits:No paranasal sinus fluid levels or advanced mucosal thickening. No mastoid or middle ear effusion.  Normal orbits. MRA HEAD FINDINGS POSTERIOR CIRCULATION: --Vertebral arteries: Normal --Inferior cerebellar arteries: Normal. --Basilar artery: Normal. --Superior cerebellar arteries: Normal. --Posterior cerebral arteries: Normal. ANTERIOR CIRCULATION: --Intracranial internal carotid arteries: Normal. --Anterior cerebral arteries (ACA): Normal. --Middle cerebral arteries (MCA): At the right MCA bifurcation, there is a laterally projecting outpouching that measures 2 mm. This may be a small aneurysm or a chronically occluded branch vessel. Otherwise, the middle cerebral arteries are normal. ANATOMIC VARIANTS: None MRA NECK FINDINGS Left dominant vertebral arteries are patent and of normal caliber. Both carotid systems are normal without stenosis. IMPRESSION: 1. No acute intracranial abnormality. 2. 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 3. No emergent large vessel occlusion or high-grade stenosis. 4. A 2 mm laterally projecting outpouching at the right MCA bifurcation may be a small aneurysm or an occluded branch vessel. Given the lack of acute ischemia, this is unlikely to be an acute occlusion. Electronically Signed   By: Ulyses Jarred M.D.   On: 06/08/2022 01:42   MR ANGIO NECK W WO CONTRAST  Result Date: 06/08/2022 CLINICAL DATA:  Transient ischemic attack.  New onset seizure. EXAM: MRI HEAD WITHOUT AND WITH CONTRAST MRA HEAD WITHOUT CONTRAST MRA NECK WITHOUT AND WITH CONTRAST TECHNIQUE: Multiplanar, multiecho pulse sequences of the brain and surrounding structures were obtained without and with intravenous contrast. Angiographic images of the Circle of Willis were obtained using MRA technique without intravenous contrast. Angiographic images of the neck were obtained using MRA technique without and with intravenous contrast. Carotid stenosis measurements (when applicable) are obtained utilizing NASCET criteria, using the distal internal carotid diameter as the denominator. CONTRAST:  6.5 mL  gadopiclenol (Vueway) 0.5 mmol/ml solution COMPARISON:  None Available. FINDINGS: MRI HEAD FINDINGS Brain: No acute infarct, mass effect or extra-axial collection. No acute or chronic hemorrhage. There is multifocal hyperintense T2-weighted signal within the white matter. Generalized volume loss. The midline structures are normal. There is no abnormal contrast enhancement. Vascular: Major flow voids are preserved. Skull and upper cervical spine: Normal calvarium and skull base. Visualized upper cervical spine and soft tissues are normal. Sinuses/Orbits:No paranasal sinus fluid levels or advanced mucosal thickening. No mastoid or middle ear effusion. Normal orbits. MRA HEAD FINDINGS POSTERIOR CIRCULATION: --Vertebral arteries: Normal --Inferior cerebellar arteries: Normal. --Basilar artery: Normal. --Superior cerebellar arteries: Normal. --Posterior cerebral arteries: Normal. ANTERIOR CIRCULATION: --Intracranial internal carotid arteries: Normal. --Anterior cerebral arteries (ACA): Normal. --Middle cerebral arteries (MCA): At the right MCA bifurcation, there is a laterally projecting outpouching that  measures 2 mm. This may be a small aneurysm or a chronically occluded branch vessel. Otherwise, the middle cerebral arteries are normal. ANATOMIC VARIANTS: None MRA NECK FINDINGS Left dominant vertebral arteries are patent and of normal caliber. Both carotid systems are normal without stenosis. IMPRESSION: 1. No acute intracranial abnormality. 2. 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 3. No emergent large vessel occlusion or high-grade stenosis. 4. A 2 mm laterally projecting outpouching at the right MCA bifurcation may be a small aneurysm or an occluded branch vessel. Given the lack of acute ischemia, this is unlikely to be an acute occlusion. Electronically Signed   By: Ulyses Jarred M.D.   On: 06/08/2022 01:42   CT HEAD CODE STROKE WO CONTRAST  Result Date: 06/07/2022 CLINICAL DATA:  Code stroke.   Neuro deficit, acute, stroke suspected EXAM: CT HEAD WITHOUT CONTRAST TECHNIQUE: Contiguous axial images were obtained from the base of the skull through the vertex without intravenous contrast. RADIATION DOSE REDUCTION: This exam was performed according to the departmental dose-optimization program which includes automated exposure control, adjustment of the mA and/or kV according to patient size and/or use of iterative reconstruction technique. COMPARISON:  CT head March 10, 2021. FINDINGS: Brain: No evidence of acute large vascular territory infarction, hemorrhage, hydrocephalus, extra-axial collection or mass lesion/mass effect. Mild for age patchy white matter hypoattenuation, nonspecific but compatible with chronic microvascular ischemic disease. Vascular: No hyperdense vessel identified. Calcific intracranial atherosclerosis. Skull: No acute fracture. Sinuses/Orbits: No acute findings. ASPECTS [facility] Stroke Program Early CT Score) total score (0-10 with 10 being normal): 10. IMPRESSION: 1. No evidence of acute intracranial abnormality. 2. ASPECTS is 10. Code stroke imaging results were communicated on 06/07/2022 at 4:52 pm to provider Dr. Quinn Axe Via secure text paging. Electronically Signed   By: Margaretha Sheffield M.D.   On: 06/07/2022 16:52    Assessment/Plan 1. 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  Had attempted gradual dose reduction of amitriptyline unsuccessfully.  She does take antipsychotic, fluphenazine  2. Dermatitis Although she complains of a rash and pruritus has seen none on exam.  Staff reports what was there has cleared.  She does have some triamcinolone cream to use as needed  3. Essential hypertension Blood pressure today 160/62; no meds  4. Age-related osteoporosis with current pathological fracture, initial encounter Formally been on bisphosphonate as well as Prolia but stopped due to expense.  DEXA was  declined  5. 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  Continue memantine    Family/ staff Communication:   Labs/tests ordered:  .smmsig

## 2022-11-23 ENCOUNTER — Non-Acute Institutional Stay (SKILLED_NURSING_FACILITY): Payer: Medicare Other | Admitting: Nurse Practitioner

## 2022-11-23 ENCOUNTER — Encounter: Payer: Self-pay | Admitting: Nurse Practitioner

## 2022-11-23 DIAGNOSIS — F323 Major depressive disorder, single episode, severe with psychotic features: Secondary | ICD-10-CM | POA: Diagnosis not present

## 2022-11-23 DIAGNOSIS — M159 Polyosteoarthritis, unspecified: Secondary | ICD-10-CM

## 2022-11-23 DIAGNOSIS — F0390 Unspecified dementia without behavioral disturbance: Secondary | ICD-10-CM

## 2022-11-23 DIAGNOSIS — K5909 Other constipation: Secondary | ICD-10-CM

## 2022-11-23 DIAGNOSIS — K219 Gastro-esophageal reflux disease without esophagitis: Secondary | ICD-10-CM | POA: Diagnosis not present

## 2022-11-23 DIAGNOSIS — L309 Dermatitis, unspecified: Secondary | ICD-10-CM

## 2022-11-23 DIAGNOSIS — I10 Essential (primary) hypertension: Secondary | ICD-10-CM

## 2022-11-23 NOTE — Progress Notes (Signed)
Location:  [place] Room Number: NO/101/A Place of Service:  SNF (31) Provider:  Harshini Trent X, NP   Patient Care Team: [name], MD as PCP - General (Internal Medicine) [name], MD (Ophthalmology) [name], MD (Psychiatry) Davonne Baby X, NP as Nurse Practitioner (Nurse Practitioner) [name], MD as Consulting Physician (Orthopedic Surgery)  Extended Emergency Contact Information Primary Emergency Contact: [name], [place] Work Phone: [phone] Relation: Son Secondary Emergency Contact: [name] [place] Phone: [phone] Relation: Sister  Code Status:  DNR Goals of care: Advanced Directive information    11/23/2022   11:31 AM  Advanced Directives  Does Patient Have a Medical Advance Directive? Yes  Type of Paramedic of [place];Living will;Out of facility DNR (pink MOST or yellow form)  Does patient want to make changes to medical advance directive? No - Patient declined  Copy of [place] in Chart? Yes - validated most recent copy scanned in chart (See row information)  Pre-existing out of facility DNR order (yellow form or pink MOST form) Pink MOST/Yellow Form most recent copy in chart - Physician notified to receive inpatient order     Chief Complaint  Patient presents with   Medical Management of Chronic Issues    Patient is here for a follow up for chronic conditions Patient also needs 2nd BP reading    HPI:  Pt is a 82 y.o. female seen today for medical management of chronic diseases.     Past Medical History:  Diagnosis Date   Allergy    Anemia    Anxiety    Asthma    CAD (coronary artery disease)    Depression    Diverticulosis    Dysfunction of eustachian tube    Elevated LFTs    Esophageal reflux    Esophageal stricture 2002   Hemorrhoids    History of rectal polyps    HLD  (hyperlipidemia)    HTN (hypertension)    Hypertonicity of bladder    Irritable bowel syndrome with constipation    Obesity    Optic neuritis    Osteoporosis    Peptic ulcer disease    Peripheral neuropathy    Trigeminal neuralgia    prior injury   Visual loss, bilateral    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, 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   Vitamin D deficiency    Past Surgical History:  Procedure Laterality Date   ABDOMINAL HYSTERECTOMY     ANAL RECTAL MANOMETRY N/A 07/05/2014   Procedure: ANO RECTAL MANOMETRY;  Surgeon: Leighton Ruff, MD;  Location: [facility];  Service: Endoscopy;  Laterality: N/A;   APPENDECTOMY     CAROTID ENDARTERECTOMY     COLONOSCOPY  2008, 2015   TONSILLECTOMY     UPPER GASTROINTESTINAL ENDOSCOPY  2002    Allergies  Allergen Reactions   Biaxin [Clarithromycin]    Clarithromycin    Doxycycline    Fosamax [Alendronate Sodium]    Geodon [Ziprasidone Hydrochloride]    Lipitor [Atorvastatin Calcium]    Penicillins    Pravachol [Pravastatin Sodium]    Statins    Sulfonamide Derivatives    Zithromax [Azithromycin]    Zocor [Simvastatin]     Outpatient Encounter Medications as of 11/23/2022  Medication Sig   acetaminophen (TYLENOL) 500 MG tablet Take 1,000 mg by mouth every 8 (eight) hours as needed for  fever, headache or mild pain. Give 500 mg by mouth three times a day related to PAIN, UNSPECIFIED   alum & mag hydroxide-simeth (MAALOX/MYLANTA) 200-200-20 MG/5ML suspension Take 30 mLs by mouth every 4 (four) hours as needed for indigestion or heartburn. Give 30 ml orally at bedtime   amitriptyline (ELAVIL) 50 MG tablet Take 50 mg by mouth daily.   Artificial Tears ophthalmic solution Place 1 drop into both eyes 3 (three) times daily. 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   Benzocaine (ORAJEL MT) Use as directed in the mouth or throat. 20-0.26-0.15 %; amt: SMALL  AMOUNT Three Times A Day - PRN;  mucous membrane Special Instructions: Apply to right inner mouth sore TID PRN [DX: Other specified disorders of teeth and supporting structures]  Every 4 Hours - PRN;may self-administer   bisacodyl (DULCOLAX) 10 MG suppository Place 10 mg rectally daily as needed for moderate constipation.   bisacodyl (DULCOLAX) 5 MG EC tablet Take 5 mg by mouth daily as needed for moderate constipation.   carbamide peroxide (DEBROX) 6.5 % OTIC solution 5 drops as needed (impaction). 5 drops to affected ear, otic (ear). At bedtime, PRN, Identify cerumen impaction by direct visualization by use of otoscope. Place 5 drops to affected ear QHS x3 nights then irrigate with bulb syringe. If not effective notify MD.   clonazePAM (KLONOPIN) 0.25 MG disintegrating tablet Take 1 tablet (0.25 mg total) by mouth 2 (two) times daily. Morning and evening   diclofenac Sodium (VOLTAREN) 1 % GEL Apply 2 g topically 2 (two) times daily as needed (pain). apply to lower back area   famotidine (PEPCID) 20 MG tablet Take 20 mg by mouth at bedtime.   fluPHENAZine (PROLIXIN) 1 MG tablet Take 1 mg by mouth at bedtime.   furosemide (LASIX) 20 MG tablet Take 20 mg by mouth daily.   guaiFENesin (MUCINEX) 600 MG 12 hr tablet Take 600 mg by mouth daily as needed (Cold or cogestion).   hydrocortisone valerate cream (WESTCORT) 0.2 % Apply to rash on face and neck topically every 12 hours as needed for Rash related to Rash and other nonspecific skin eruption   ketoconazole (NIZORAL) 2 % cream Apply 1 application  topically 2 (two) times daily. APPLY TO RASH ON FACE AND NECK TWICE DAILY AS NEEDED   ketoconazole (NIZORAL) 2 % shampoo Apply 1 application. topically 2 (two) times a week. Tuesday and Friday   loratadine (CLARITIN) 10 MG tablet Take 10 mg by mouth daily.   magnesium hydroxide (MILK OF MAGNESIA) 400 MG/5ML suspension Take 30 mLs by mouth daily as needed for mild constipation.   memantine (NAMENDA) 10 MG  tablet Take 10 mg by mouth 2 (two) times daily.   Multiple Vitamins-Minerals (SENTRY SENIOR) TABS Take 1 tablet by mouth daily. 500-300-250 mcg [DX: Vitamin deficiency, unspecified]   OYSTER SHELL PO Take 250 mg by mouth in the morning and at bedtime.   Polyethylene Glycol 3350 (MIRALAX PO) Take 17 g by mouth See admin instructions. Take 17 gm, oral twice a day, every other day. Miralax 17 gm + 4-8oz fluid mizture BID   saccharomyces boulardii (FLORASTOR) 250 MG capsule Take 250 mg by mouth daily.   spironolactone (ALDACTONE) 25 MG tablet Take 12.5 mg by mouth daily.   triamcinolone cream (KENALOG) 0.1 % Apply 1 application  topically as needed (rash). Apply BID as needed to rash to chest, behind L ear,  left posterior neck.   No facility-administered encounter medications on file as of 11/23/2022.    Review of Systems  Immunization History  Administered Date(s) Administered   Influenza Split 07/23/2013   Influenza Whole 07/05/2009, 06/26/2018   Influenza-Unspecified 07/29/2014, 06/14/2015, 07/11/2016, 07/15/2017, 06/27/2019, 07/05/2020, 07/13/2021, 07/21/2022   Moderna SARS-COV2 Booster Vaccination 02/09/2022   Moderna Sars-Covid-2 Vaccination 09/26/2019, 10/24/2019, 08/02/2020   PFIZER(Purple Top)SARS-COV-2 Vaccination 09/26/2019   PPD Test 06/17/2013   Pneumococcal Conjugate-13 07/22/2017   Pneumococcal Polysaccharide-23 05/05/2009   Respiratory Syncytial Virus Vaccine,Recomb Aduvanted(Arexvy) 10/12/2022   Td 05/05/2009, 07/16/2019   Unspecified SARS-COV-2 Vaccination 02/21/2021, 06/13/2021   Zoster Recombinat (Shingrix) 09/27/2021, 12/05/2021   Zoster, Live 01/09/2011   Pertinent  Health Maintenance Due  Topic Date Due   INFLUENZA VACCINE  Completed   DEXA SCAN  Completed      06/08/2022    4:25 AM 06/08/2022    5:57 PM 06/08/2022   11:00 PM 06/09/2022   10:00 AM 11/23/2022   11:33 AM  Fall Risk  Falls in the past year?     1  Was there an injury with Fall?     1  Fall Risk  Category Calculator     2  (RETIRED) Patient Fall Risk Level High fall risk High fall risk High fall risk High fall risk   Patient at Risk for Falls Due to     History of fall(s);Impaired balance/gait  Fall risk Follow up     Falls evaluation completed   Functional Status Survey:    Vitals:   11/23/22 1134  BP: (!) 160/91  Pulse: 70  Resp: 17  Temp: 97.9 F (36.6 C)  SpO2: 95%  Weight: 125 lb 12.8 oz (57.1 kg)  Height: '5\' 5"'$  (1.651 m)   Body mass index is 20.93 kg/m. Physical Exam  Labs reviewed: Recent Labs    06/07/22 1646 06/08/22 0032 06/09/22 0504 06/19/22 0000  NA 138 139 141 138  K 5.0 4.8 4.3 4.0  CL 103 104 107 100  CO2  --  25 22 24*  GLUCOSE 97 102* 99  --   BUN 27* '21 14 21  '$ CREATININE 1.00 0.92 0.95 1.0  CALCIUM  --  9.1 9.4 9.4  MG  --  2.3 2.1  --   PHOS  --  3.8 3.3  --    Recent Labs    06/07/22 1641 06/08/22 0032 06/09/22 0504 06/19/22 0000  AST 29 34  --  20  ALT 18 19  --  14  ALKPHOS 100 98  --  123  BILITOT 0.3 0.6  --   --   PROT 7.2 6.7  --   --   ALBUMIN 4.0 3.8 3.5 4.2   Recent Labs    04/26/22 0000 04/26/22 0000 06/07/22 1641 06/07/22 1646 06/08/22 0032 06/09/22 0504 06/19/22 0000  WBC 5.9   < > 6.4  --  9.2 7.4 7.2  NEUTROABS 3,039.00  --  3.1  --   --   --  5,134.00  HGB 13.6  --  15.1*   < > 12.8 13.4 13.3  HCT 40  --  46.3*   < > 39.3 39.7 40  MCV  --   --  96.9  --  95.6 95.4  --   PLT 353  --  303  --  333 304 362   < > = values in this interval not displayed.   Lab Results  Component Value Date   TSH 1.62  08/15/2021   Lab Results  Component Value Date   HGBA1C 5.6 06/08/2022   Lab Results  Component Value Date   CHOL 201 (H) 06/08/2022   HDL 71 06/08/2022   LDLCALC 113 (H) 06/08/2022   LDLDIRECT 190.0 03/24/2012   TRIG 84 06/08/2022   CHOLHDL 2.8 06/08/2022    Significant Diagnostic Results in last 30 days:  No results found.  Assessment/Plan There are no diagnoses linked to this  encounter.   Family/ staff Communication: ***  Labs/tests ordered:  ***

## 2022-11-26 ENCOUNTER — Encounter: Payer: Self-pay | Admitting: Nurse Practitioner

## 2022-11-26 NOTE — Assessment & Plan Note (Signed)
mostly in cheeks, intermittent, follows dermatology.

## 2022-11-26 NOTE — Assessment & Plan Note (Signed)
on Tylenol, ambulates with walker

## 2022-11-26 NOTE — Assessment & Plan Note (Signed)
blood pressure is controlled on diuretics. Bun/creat 21/1.0 06/19/22

## 2022-11-26 NOTE — Assessment & Plan Note (Addendum)
supportive care in memory care unit [facility] since Flu 08/2021. CT head was negative for acute process. Taking Memantine. MMSE 21/30 10/13/22

## 2022-11-26 NOTE — Assessment & Plan Note (Signed)
on prn Dulcolax po/suppository, prn MOM, MiraLax qd/bid alternative.

## 2022-11-26 NOTE — Assessment & Plan Note (Signed)
stable, on Famotidine, Maalox qd/prn, Hgb 13.3 06/19/22

## 2022-11-26 NOTE — Assessment & Plan Note (Signed)
stabilized, resumed Amitriptyline at '50mg'$  qd-failed GDR, off Librium, continued Fluphenazine, on Clonazepam, TSH 1.62 08/15/21

## 2022-12-17 ENCOUNTER — Encounter: Payer: Self-pay | Admitting: Nurse Practitioner

## 2022-12-17 ENCOUNTER — Non-Acute Institutional Stay (SKILLED_NURSING_FACILITY): Payer: Medicare Other | Admitting: Nurse Practitioner

## 2022-12-17 DIAGNOSIS — K219 Gastro-esophageal reflux disease without esophagitis: Secondary | ICD-10-CM

## 2022-12-17 DIAGNOSIS — F0390 Unspecified dementia without behavioral disturbance: Secondary | ICD-10-CM | POA: Diagnosis not present

## 2022-12-17 DIAGNOSIS — F323 Major depressive disorder, single episode, severe with psychotic features: Secondary | ICD-10-CM

## 2022-12-17 DIAGNOSIS — K5909 Other constipation: Secondary | ICD-10-CM | POA: Diagnosis not present

## 2022-12-17 DIAGNOSIS — M8000XA Age-related osteoporosis with current pathological fracture, unspecified site, initial encounter for fracture: Secondary | ICD-10-CM

## 2022-12-17 DIAGNOSIS — L309 Dermatitis, unspecified: Secondary | ICD-10-CM

## 2022-12-17 DIAGNOSIS — J309 Allergic rhinitis, unspecified: Secondary | ICD-10-CM | POA: Diagnosis not present

## 2022-12-17 DIAGNOSIS — I10 Essential (primary) hypertension: Secondary | ICD-10-CM

## 2022-12-17 NOTE — Progress Notes (Signed)
Location:   SNF [place] Room Number: NO/101/A Place of Service:  SNF (31) Provider: [facility] Yerick Eggebrecht NP  [name], MD  Patient Care Team: [name], MD as PCP - General (Internal Medicine) [name], MD (Ophthalmology) [name], MD (Psychiatry) Blakeley Scheier X, NP as Nurse Practitioner (Nurse Practitioner) [name], MD as Consulting Physician (Orthopedic Surgery)  Extended Emergency Contact Information Primary Emergency Contact: [name], [place] Work Phone: [phone] Relation: Son Secondary Emergency Contact: [name] [place] Phone: [phone] Relation: Sister  Code Status:  DNR Goals of care: Advanced Directive information    11/23/2022   11:31 AM  Advanced Directives  Does Patient Have a Medical Advance Directive? Yes  Type of [place] of [place];Living will;Out of facility DNR (pink MOST or yellow form)  Does patient want to make changes to medical advance directive? No - Patient declined  Copy of [place] in Chart? Yes - validated most recent copy scanned in chart (See row information)  Pre-existing out of facility DNR order (yellow form or pink MOST form) Pink MOST/Yellow Form most recent copy in chart - Physician notified to receive inpatient order     Chief Complaint  Patient presents with   Medical Management of Chronic Issues    Routine Visit    HPI:  Pt is a 82 y.o. female seen today for medical management of chronic diseases.     HTN, blood pressure is controlled on diuretics. Bun/creat 21/1.0 06/19/22             OA on Tylenol, ambulates with walker.              GERD, stable, on Famotidine, Maalox qd/prn, Hgb 13.3 06/19/22             Depression/anxiety/hallucination, stabilized, resumed Amitriptyline at 50mg  qd-failed GDR, off Librium, continued Fluphenazine, on Clonazepam, TSH 1.62 08/15/21              Dementia, supportive care in memory care unit [facility] since Flu 08/2021. CT head was negative for acute process. Taking Memantine. MMSE 21/30 10/13/22             Chronic constipation, on prn Dulcolax po/suppository, prn MOM, MiraLax qd/bid alternative.              Allergic rhinitis, takes Claritin, Flonase, Mucinex        OP, stopped taking Prolia due to cost, ? Allergic to Fosamax.  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, 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   Vitamin D deficiency    Past Surgical History:  Procedure Laterality Date   ABDOMINAL HYSTERECTOMY     ANAL RECTAL MANOMETRY N/A 07/05/2014   Procedure: ANO RECTAL MANOMETRY;  Surgeon: Leighton Ruff, MD;  Location: [facility];  Service: Endoscopy;  Laterality: N/A;   APPENDECTOMY     CAROTID ENDARTERECTOMY     COLONOSCOPY  2008, 2015   TONSILLECTOMY     UPPER GASTROINTESTINAL ENDOSCOPY  2002    Allergies  Allergen Reactions   Biaxin [Clarithromycin]    Clarithromycin    Doxycycline    Fosamax [Alendronate Sodium]    Geodon [Ziprasidone Hydrochloride]    Lipitor [Atorvastatin Calcium]    Penicillins    Pravachol [Pravastatin Sodium]    Statins    Sulfonamide Derivatives    Zithromax [Azithromycin]    Zocor [Simvastatin]     Allergies as of 12/17/2022        Reactions   Biaxin [clarithromycin]    Clarithromycin    Doxycycline    Fosamax [alendronate Sodium]    Geodon [ziprasidone Hydrochloride]    Lipitor [atorvastatin Calcium]    Penicillins    Pravachol [pravastatin Sodium]    Statins    Sulfonamide Derivatives    Zithromax [azithromycin]    Zocor [simvastatin]         Medication List        Accurate as of December 17, 2022 12:47 PM. If you have any questions, ask your nurse or doctor.          acetaminophen 500 MG tablet Commonly known as: TYLENOL Take 1,000 mg by mouth every 8 (eight) hours as needed for fever, headache or mild pain. Give 500 mg by mouth three times a day related to PAIN, UNSPECIFIED   alum & mag hydroxide-simeth 200-200-20 MG/5ML suspension Commonly known as: MAALOX/MYLANTA Take 30 mLs by mouth every 4 (four) hours as needed for indigestion or heartburn. Give 30 ml orally at bedtime   amitriptyline 50 MG tablet Commonly known as: ELAVIL Take 50 mg by mouth daily.   Artificial Tears ophthalmic solution Place 1 drop into both eyes 3 (three) times daily. 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   bisacodyl 10 MG suppository Commonly known as: DULCOLAX Place 10 mg rectally daily as needed for moderate constipation.   bisacodyl 5 MG EC tablet Commonly known as: DULCOLAX Take 5 mg by mouth daily as needed for moderate constipation.   carbamide peroxide 6.5 % OTIC solution Commonly known as: DEBROX 5 drops as needed (impaction). 5 drops to affected ear, otic (ear). At bedtime, PRN, Identify cerumen impaction by direct visualization by use of otoscope. Place 5 drops to affected ear QHS x3 nights then irrigate with bulb syringe. If not effective notify MD.   clonazePAM 0.25 MG disintegrating tablet Commonly known as: KLONOPIN Take 1 tablet (0.25 mg total) by mouth 2 (two) times daily. Morning and evening   diclofenac  Sodium 1 % Gel Commonly known as: VOLTAREN Apply 2 g topically 2 (two) times daily as needed (pain). apply to lower back area   famotidine 20 MG tablet Commonly known as: PEPCID Take 20 mg by mouth at bedtime.   fluPHENAZine 1 MG tablet Commonly known as: PROLIXIN Take 1 mg by mouth at bedtime.   furosemide 20 MG tablet Commonly known as: LASIX Take 20 mg by mouth daily.   guaiFENesin 600  MG 12 hr tablet Commonly known as: MUCINEX Take 600 mg by mouth daily as needed (Cold or cogestion).   hydrocortisone valerate cream 0.2 % Commonly known as: WESTCORT Apply to rash on face and neck topically every 12 hours as needed for Rash related to Rash and other nonspecific skin eruption   ketoconazole 2 % shampoo Commonly known as: NIZORAL Apply 1 application. topically 2 (two) times a week. Tuesday and Friday   ketoconazole 2 % cream Commonly known as: NIZORAL Apply 1 application  topically 2 (two) times daily. APPLY TO RASH ON FACE AND NECK TWICE DAILY AS NEEDED   loratadine 10 MG tablet Commonly known as: CLARITIN Take 10 mg by mouth daily.   magnesium hydroxide 400 MG/5ML suspension Commonly known as: MILK OF MAGNESIA Take 30 mLs by mouth daily as needed for mild constipation.   memantine 10 MG tablet Commonly known as: NAMENDA Take 10 mg by mouth 2 (two) times daily.   MIRALAX PO Take 17 g by mouth See admin instructions. Take 17 gm, oral twice a day, every other day. Miralax 17 gm + 4-8oz fluid mizture BID   ORAJEL MT Use as directed in the mouth or throat. 20-0.26-0.15 %; amt: SMALL AMOUNT Three Times A Day - PRN;  mucous membrane Special Instructions: Apply to right inner mouth sore TID PRN [DX: Other specified disorders of teeth and supporting structures]  Every 4 Hours - PRN;may self-administer   OYSTER SHELL PO Take 250 mg by mouth in the morning and at bedtime.   saccharomyces boulardii 250 MG capsule Commonly known as: FLORASTOR Take 250 mg by mouth  daily.   [place] Senior Tabs Take 1 tablet by mouth daily. 500-300-250 mcg [DX: Vitamin deficiency, unspecified]   spironolactone 25 MG tablet Commonly known as: ALDACTONE Take 12.5 mg by mouth daily.   triamcinolone cream 0.1 % Commonly known as: KENALOG Apply 1 application  topically as needed (rash). Apply BID as needed to rash to chest, behind L ear, left posterior neck.        Review of Systems  Constitutional:  Negative for appetite change, fatigue and fever.  HENT:  Positive for hearing loss. Negative for congestion, rhinorrhea and voice change.   Eyes:  Positive for visual disturbance.       Low vision  Respiratory:  Negative for cough and shortness of breath.   Cardiovascular:  Negative for leg swelling.  Gastrointestinal:  Negative for abdominal pain and constipation.  Genitourinary:  Negative for dysuria, frequency and urgency.  Musculoskeletal:  Positive for arthralgias and gait problem.  Skin:  Negative for rash.  Neurological:  Negative for speech difficulty, weakness and light-headedness.       Memory lapses  Psychiatric/Behavioral:  Positive for confusion. Negative for behavioral problems, hallucinations and sleep disturbance. The patient is not nervous/anxious.     Immunization History  Administered Date(s) Administered   Influenza Split 07/23/2013   Influenza Whole 07/05/2009, 06/26/2018   Influenza-Unspecified 07/29/2014, 06/14/2015, 07/11/2016, 07/15/2017, 06/27/2019, 07/05/2020, 07/13/2021, 07/21/2022   Moderna SARS-COV2 Booster Vaccination 02/09/2022   Moderna Sars-Covid-2 Vaccination 09/26/2019, 10/24/2019, 08/02/2020   PFIZER(Purple Top)SARS-COV-2 Vaccination 09/26/2019   PPD Test 06/17/2013   Pneumococcal Conjugate-13 07/22/2017   Pneumococcal Polysaccharide-23 05/05/2009   Respiratory Syncytial Virus Vaccine,Recomb Aduvanted(Arexvy) 10/12/2022   Td 05/05/2009, 07/16/2019   Unspecified SARS-COV-2 Vaccination 02/21/2021, 06/13/2021   Zoster  Recombinat (Shingrix) 09/27/2021, 12/05/2021   Zoster, Live 01/09/2011   Pertinent  Health Maintenance Due  Topic Date Due   INFLUENZA VACCINE  Completed   DEXA SCAN  Completed      06/08/2022    4:25 AM 06/08/2022    5:57 PM 06/08/2022   11:00 PM 06/09/2022   10:00 AM 11/23/2022   11:33 AM  Fall Risk  Falls in the past year?     1  Was there an injury with Fall?     1  Fall Risk Category Calculator     2  (RETIRED) Patient Fall Risk Level High fall risk High fall risk High fall risk High fall risk   Patient at Risk for Falls Due to     History of fall(s);Impaired balance/gait  Fall risk Follow up     Falls evaluation completed   Functional Status Survey:    Vitals:   12/17/22 1151  BP: 130/60  Pulse: 64  Resp: 18  Temp: 98.1 F (36.7 C)  SpO2: 97%  Weight: 125 lb 8 oz (56.9 kg)  Height: 5\' 5"  (1.651 m)   Body mass index is 20.88 kg/m. Physical Exam Constitutional:      Appearance: Normal appearance.  HENT:     Head: Normocephalic.     Nose: Nose normal.     Mouth/Throat:     Mouth: Mucous membranes are moist.  Eyes:     Extraocular Movements: Extraocular movements intact.     Conjunctiva/sclera: Conjunctivae normal.     Pupils: Pupils are equal, round, and reactive to light.     Comments: Legally blind  Cardiovascular:     Rate and Rhythm: Normal rate and regular rhythm.     Heart sounds: No murmur heard.    Comments: DP pulses R+L present.  Pulmonary:     Effort: Pulmonary effort is normal.     Breath sounds: No rales.  Abdominal:     General: Bowel sounds are normal.     Palpations: Abdomen is soft.     Tenderness: There is no abdominal tenderness.  Genitourinary:    Comments: From previous examination a pinto bean sized cyst like lump palpated near the clitoris, asymptomatic.  Musculoskeletal:     Cervical back: Normal range of motion and neck supple.     Right lower leg: No edema.     Left lower leg: No edema.  Skin:    General: Skin is warm and  dry.     Findings: No rash.  Neurological:     General: No focal deficit present.     Mental Status: She is alert. Mental status is at baseline.     Gait: Gait abnormal.     Comments: Oriented to person, place.   Psychiatric:        Mood and Affect: Mood normal.        Behavior: Behavior normal.        Thought Content: Thought content normal.     Comments: The patient appears at her baseline mentation during my examination.      Labs reviewed: Recent Labs    06/07/22 1646 06/08/22 0032 06/09/22 0504 06/19/22 0000  NA 138 139 141 138  K 5.0 4.8 4.3 4.0  CL 103 104 107 100  CO2  --  25 22 24*  GLUCOSE 97 102* 99  --   BUN 27* 21 14 21   CREATININE 1.00 0.92 0.95 1.0  CALCIUM  --  9.1 9.4 9.4  MG  --  2.3 2.1  --   PHOS  --  3.8 3.3  --    Recent Labs    06/07/22  1641 06/08/22 0032 06/09/22 0504 06/19/22 0000  AST 29 34  --  20  ALT 18 19  --  14  ALKPHOS 100 98  --  123  BILITOT 0.3 0.6  --   --   PROT 7.2 6.7  --   --   ALBUMIN 4.0 3.8 3.5 4.2   Recent Labs    04/26/22 0000 04/26/22 0000 06/07/22 1641 06/07/22 1646 06/08/22 0032 06/09/22 0504 06/19/22 0000  WBC 5.9   < > 6.4  --  9.2 7.4 7.2  NEUTROABS 3,039.00  --  3.1  --   --   --  5,134.00  HGB 13.6  --  15.1*   < > 12.8 13.4 13.3  HCT 40  --  46.3*   < > 39.3 39.7 40  MCV  --   --  96.9  --  95.6 95.4  --   PLT 353  --  303  --  333 304 362   < > = values in this interval not displayed.   Lab Results  Component Value Date   TSH 1.62 08/15/2021   Lab Results  Component Value Date   HGBA1C 5.6 06/08/2022   Lab Results  Component Value Date   CHOL 201 (H) 06/08/2022   HDL 71 06/08/2022   LDLCALC 113 (H) 06/08/2022   LDLDIRECT 190.0 03/24/2012   TRIG 84 06/08/2022   CHOLHDL 2.8 06/08/2022    Significant Diagnostic Results in last 30 days:  No results found.  Assessment/Plan  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[place])  supportive care in memory care unit [facility] since Flu 08/2021. CT head was negative for  acute process. Taking Memantine. MMSE 21/30 10/13/22  Constipation Stable,  on prn Dulcolax po/suppository, prn MOM, MiraLax qd/bid alternative.   Allergic rhinitis  takes Claritin, Flonase, Mucinex         Osteoporosis stopped taking Prolia due to cost, ? Allergic to Fosamax.  her son declined DEXA  Dermatitis mostly in cheeks, intermittent, follows dermatology.   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   Labs/tests ordered:  none  Time spend 35 minutes.

## 2022-12-17 NOTE — Assessment & Plan Note (Signed)
mostly in cheeks, intermittent, follows dermatology.  

## 2022-12-17 NOTE — Assessment & Plan Note (Signed)
blood pressure is controlled on diuretics. Bun/creat 21/1.0 06/19/22 

## 2022-12-17 NOTE — Assessment & Plan Note (Signed)
stabilized, resumed Amitriptyline at 50mg qd-failed GDR, off Librium, continued Fluphenazine, on Clonazepam, TSH 1.62 08/15/21 

## 2022-12-17 NOTE — Assessment & Plan Note (Signed)
stopped taking Prolia due to cost, ? Allergic to Fosamax.  her son declined DEXA °

## 2022-12-17 NOTE — Assessment & Plan Note (Signed)
Stable,  on prn Dulcolax po/suppository, prn MOM, MiraLax qd/bid alternative.  

## 2022-12-17 NOTE — Assessment & Plan Note (Signed)
supportive care in memory care unit [facility] since Flu 08/2021. CT head was negative for acute process. Taking Memantine. MMSE 21/30 10/13/22 

## 2022-12-17 NOTE — Assessment & Plan Note (Signed)
takes Claritin, Flonase, Mucinex        

## 2022-12-17 NOTE — Assessment & Plan Note (Signed)
Stable, stable, on Famotidine, Maalox qd/prn, Hgb 13.3 06/19/22

## 2022-12-17 NOTE — Progress Notes (Unsigned)
Location:  [facility] Room Number: NO/101/A Place of Service:  SNF (31) Provider: Temesha Queener X, NP    [name], MD as PCP - General (Internal Medicine) [name], MD (Ophthalmology) [name], MD (Psychiatry) Andra Heslin X, NP as Nurse Practitioner (Nurse Practitioner) [name], MD as Consulting Physician (Orthopedic Surgery)  Extended Emergency Contact Information Primary Emergency Contact: [name], [place] Work Phone: [phone] Relation: Son Secondary Emergency Contact: [name] Home Phone: [phone] Relation: Sister  Code Status: DNR Goals of care: Advanced Directive information    12/17/2022    3:50 PM  Advanced Directives  Does Patient Have a Medical Advance Directive? Yes  Type of Estate agent of [place];Living will;Out of facility DNR (pink MOST or yellow form)  Does patient want to make changes to medical advance directive? No - Patient declined  Copy of Healthcare Power of Attorney in Chart? Yes - validated most recent copy scanned in chart (See row information)  Pre-existing out of facility DNR order (yellow form or pink MOST form) Pink MOST/Yellow Form most recent copy in chart - Physician notified to receive inpatient order     Chief Complaint  Patient presents with   Medical Management of Chronic Issues    Routine Visit    HPI:  Pt is a 82 y.o. female seen today for medical management of chronic diseases.     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, 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   Vitamin D deficiency    Past Surgical History:  Procedure Laterality Date   ABDOMINAL HYSTERECTOMY     ANAL RECTAL MANOMETRY N/A 07/05/2014   Procedure: ANO RECTAL MANOMETRY;  Surgeon: Romie Levee, MD;  Location: [facility];  Service: Endoscopy;  Laterality: N/A;   APPENDECTOMY     CAROTID ENDARTERECTOMY     COLONOSCOPY  2008, 2015   TONSILLECTOMY     UPPER GASTROINTESTINAL ENDOSCOPY  2002    Allergies  Allergen Reactions   Biaxin [Clarithromycin]    Clarithromycin    Doxycycline    Fosamax [Alendronate Sodium]    Geodon [Ziprasidone Hydrochloride]    Lipitor [Atorvastatin Calcium]    Penicillins    Pravachol [Pravastatin Sodium]    Statins    Sulfonamide Derivatives    Zithromax [Azithromycin]    Zocor [Simvastatin]     Outpatient Encounter Medications as of 12/17/2022  Medication Sig   acetaminophen (TYLENOL) 500 MG tablet Take 1,000 mg by mouth every 8 (eight) hours as needed for fever, headache or mild pain. Give 500 mg by mouth three times a day  related to PAIN, UNSPECIFIED   alum & mag hydroxide-simeth (MAALOX/MYLANTA) 200-200-20 MG/5ML suspension Take 30 mLs by mouth every 4 (four) hours as needed for indigestion or heartburn. Give 30 ml orally at bedtime   amitriptyline (ELAVIL) 50 MG tablet Take 50 mg by mouth daily.   Artificial Tears ophthalmic solution Place 1 drop into both eyes 3 (three) times daily. 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   Benzocaine (ORAJEL MT) Use as directed in the mouth or throat. 20-0.26-0.15 %; amt: SMALL AMOUNT Three Times A Day - PRN;  mucous membrane Special Instructions: Apply to right  inner mouth sore TID PRN [DX: Other specified disorders of teeth and supporting structures]  Every 4 Hours - PRN;may self-administer   bisacodyl (DULCOLAX) 10 MG suppository Place 10 mg rectally daily as needed for moderate constipation.   bisacodyl (DULCOLAX) 5 MG EC tablet Take 5 mg by mouth daily as needed for moderate constipation.   carbamide peroxide (DEBROX) 6.5 % OTIC solution 5 drops as needed (impaction). 5 drops to affected ear, otic (ear). At bedtime, PRN, Identify cerumen impaction by direct visualization by use of otoscope. Place 5 drops to affected ear QHS x3 nights then irrigate with bulb syringe. If not effective notify MD.   clonazePAM (KLONOPIN) 0.25 MG disintegrating tablet Take 1 tablet (0.25 mg total) by mouth 2 (two) times daily. Morning and evening   diclofenac Sodium (VOLTAREN) 1 % GEL Apply 2 g topically 2 (two) times daily as needed (pain). apply to lower back area   famotidine (PEPCID) 20 MG tablet Take 20 mg by mouth at bedtime.   fluPHENAZine (PROLIXIN) 1 MG tablet Take 1 mg by mouth at bedtime.   furosemide (LASIX) 20 MG tablet Take 20 mg by mouth daily.   guaiFENesin (MUCINEX) 600 MG 12 hr tablet Take 600 mg by mouth daily as needed (Cold or cogestion).   hydrocortisone valerate cream (WESTCORT) 0.2 % Apply to rash on face and neck topically every 12 hours as needed for Rash related to Rash and other nonspecific skin eruption   ketoconazole (NIZORAL) 2 % cream Apply 1 application  topically 2 (two) times daily. APPLY TO RASH ON FACE AND NECK TWICE DAILY AS NEEDED   ketoconazole (NIZORAL) 2 % shampoo Apply 1 application. topically 2 (two) times a week. Tuesday and Friday   loratadine (CLARITIN) 10 MG tablet Take 10 mg by mouth daily.   magnesium hydroxide (MILK OF MAGNESIA) 400 MG/5ML suspension Take 30 mLs by mouth daily as needed for mild constipation.   memantine (NAMENDA) 10 MG tablet Take 10 mg by mouth 2 (two) times daily.   Multiple Vitamins-Minerals (SENTRY  SENIOR) TABS Take 1 tablet by mouth daily. 500-300-250 mcg [DX: Vitamin deficiency, unspecified]   OYSTER SHELL PO Take 250 mg by mouth in the morning and at bedtime.   Polyethylene Glycol 3350 (MIRALAX PO) Take 17 g by mouth See admin instructions. Take 17 gm, oral twice a day, every other day. Miralax 17 gm + 4-8oz fluid mizture BID   saccharomyces boulardii (FLORASTOR) 250 MG capsule Take 250 mg by mouth daily.   spironolactone (ALDACTONE) 25 MG tablet Take 12.5 mg by mouth daily.   triamcinolone cream (KENALOG) 0.1 % Apply 1 application  topically as needed (rash). Apply BID as needed to rash to chest, behind L ear, left posterior neck.   No facility-administered encounter medications on file as of 12/17/2022.  Review of Systems  Immunization History  Administered Date(s) Administered   Influenza Split 07/23/2013   Influenza Whole 07/05/2009, 06/26/2018   Influenza-Unspecified 07/29/2014, 06/14/2015, 07/11/2016, 07/15/2017, 06/27/2019, 07/05/2020, 07/13/2021, 07/21/2022   Moderna SARS-COV2 Booster Vaccination 02/09/2022   Moderna Sars-Covid-2 Vaccination 09/26/2019, 10/24/2019, 08/02/2020   PFIZER(Purple Top)SARS-COV-2 Vaccination 09/26/2019   PPD Test 06/17/2013   Pneumococcal Conjugate-13 07/22/2017   Pneumococcal Polysaccharide-23 05/05/2009   Respiratory Syncytial Virus Vaccine,Recomb Aduvanted(Arexvy) 10/12/2022   Td 05/05/2009, 07/16/2019   Unspecified SARS-COV-2 Vaccination 02/21/2021, 06/13/2021   Zoster Recombinat (Shingrix) 09/27/2021, 12/05/2021   Zoster, Live 01/09/2011   Pertinent  Health Maintenance Due  Topic Date Due   INFLUENZA VACCINE  Completed   DEXA SCAN  Completed      06/08/2022    5:57 PM 06/08/2022   11:00 PM 06/09/2022   10:00 AM 11/23/2022   11:33 AM 12/17/2022    3:49 PM  Fall Risk  Falls in the past year?    1 0  Was there an injury with Fall?    1 0  Fall Risk Category Calculator    2 0  (RETIRED) Patient Fall Risk Level High fall risk High  fall risk High fall risk    Patient at Risk for Falls Due to    History of fall(s);Impaired balance/gait History of fall(s);Impaired balance/gait  Fall risk Follow up    Falls evaluation completed Falls evaluation completed   Functional Status Survey:    Vitals:   12/17/22 1546  BP: 130/60  Pulse: 64  Resp: 18  Temp: 98.1 F (36.7 C)  SpO2: 97%  Weight: 125 lb 8 oz (56.9 kg)  Height: 5\' 5"  (1.651 m)   Body mass index is 20.88 kg/m. Physical Exam  Labs reviewed: Recent Labs    06/07/22 1646 06/08/22 0032 06/09/22 0504 06/19/22 0000  NA 138 139 141 138  K 5.0 4.8 4.3 4.0  CL 103 104 107 100  CO2  --  25 22 24*  GLUCOSE 97 102* 99  --   BUN 27* 21 14 21   CREATININE 1.00 0.92 0.95 1.0  CALCIUM  --  9.1 9.4 9.4  MG  --  2.3 2.1  --   PHOS  --  3.8 3.3  --    Recent Labs    06/07/22 1641 06/08/22 0032 06/09/22 0504 06/19/22 0000  AST 29 34  --  20  ALT 18 19  --  14  ALKPHOS 100 98  --  123  BILITOT 0.3 0.6  --   --   PROT 7.2 6.7  --   --   ALBUMIN 4.0 3.8 3.5 4.2   Recent Labs    04/26/22 0000 04/26/22 0000 06/07/22 1641 06/07/22 1646 06/08/22 0032 06/09/22 0504 06/19/22 0000  WBC 5.9   < > 6.4  --  9.2 7.4 7.2  NEUTROABS 3,039.00  --  3.1  --   --   --  5,134.00  HGB 13.6  --  15.1*   < > 12.8 13.4 13.3  HCT 40  --  46.3*   < > 39.3 39.7 40  MCV  --   --  96.9  --  95.6 95.4  --   PLT 353  --  303  --  333 304 362   < > = values in this interval not displayed.   Lab Results  Component Value Date   TSH 1.62 08/15/2021   Lab Results  Component Value Date   HGBA1C 5.6 06/08/2022  Lab Results  Component Value Date   CHOL 201 (H) 06/08/2022   HDL 71 06/08/2022   LDLCALC 113 (H) 06/08/2022   LDLDIRECT 190.0 03/24/2012   TRIG 84 06/08/2022   CHOLHDL 2.8 06/08/2022    Significant Diagnostic Results in last 30 days:  No results found.  Assessment/Plan There are no diagnoses linked to this encounter.   Family/ staff Communication:  ***  Labs/tests ordered:  ***

## 2022-12-23 IMAGING — CT CT CERVICAL SPINE W/O CM
3 of 4 series · 13 of 33 positions shown, 16 images · non-contrast
Comparison: Head and cervical spine CT scans 03/31/2013.

CLINICAL DATA: Status post trip and fall with a blow to the head.
Initial encounter.

EXAM:
CT HEAD WITHOUT CONTRAST
CT CERVICAL SPINE WITHOUT CONTRAST
TECHNIQUE: Multidetector CT imaging of the head and cervical spine was
performed following the standard protocol without intravenous
contrast. Multiplanar CT image reconstructions of the cervical spine
were also generated.

[Series 5: orthogonal bone · axial · 0.23mm/px · z∈[-274,-155]mm · 5 of 92 slices shown, 7 images]
[im 16/92  soft-tissue]
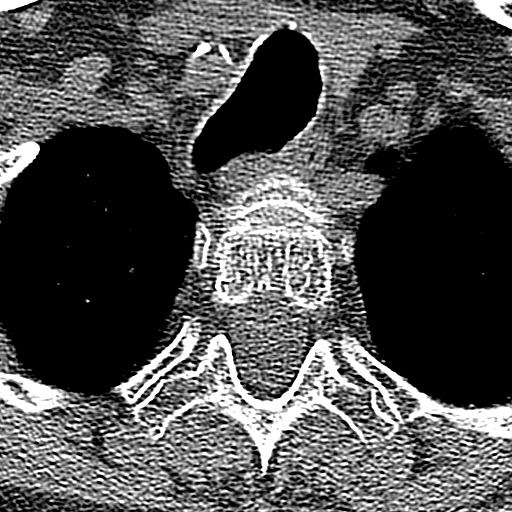
[im 16/92  bone]
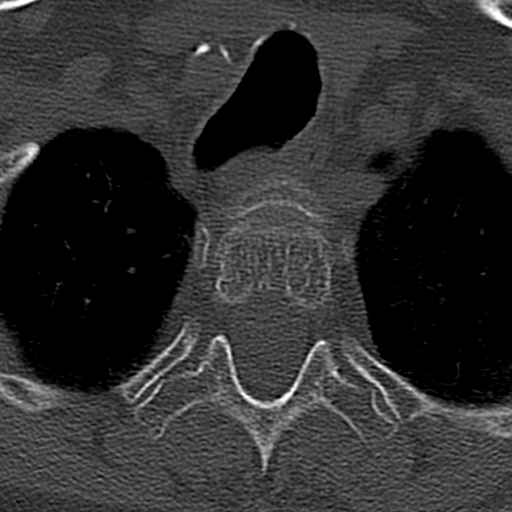
[im 31/92  bone]
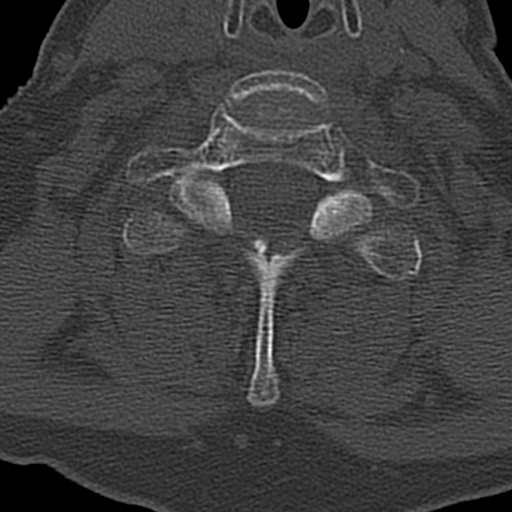
[im 46/92  bone]
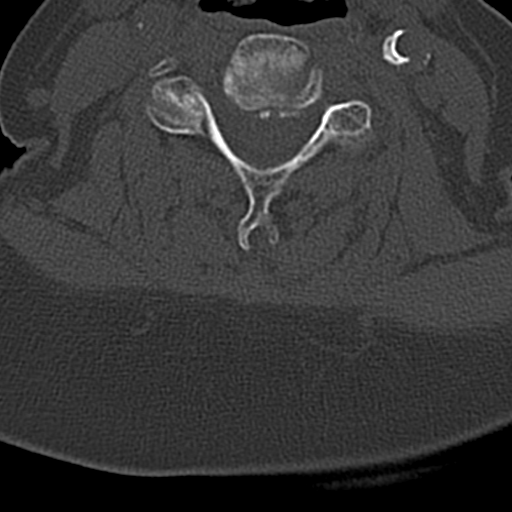
[im 61/92  bone]
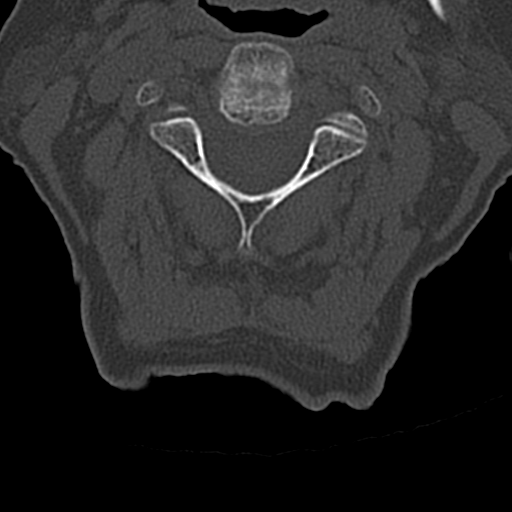
[im 76/92  soft-tissue]
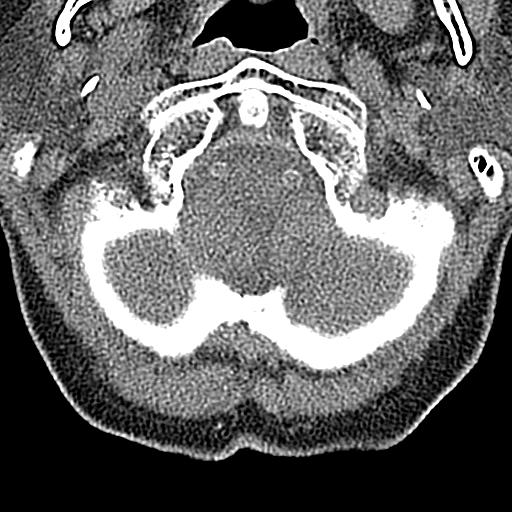
[im 76/92  bone]
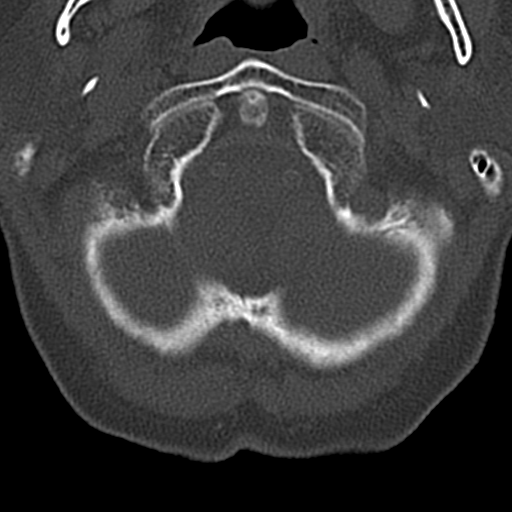

[Series 6: coronal bone · coronal · 0.24mm/px · 3 of 66 slices shown]
[im 14/66  bone]
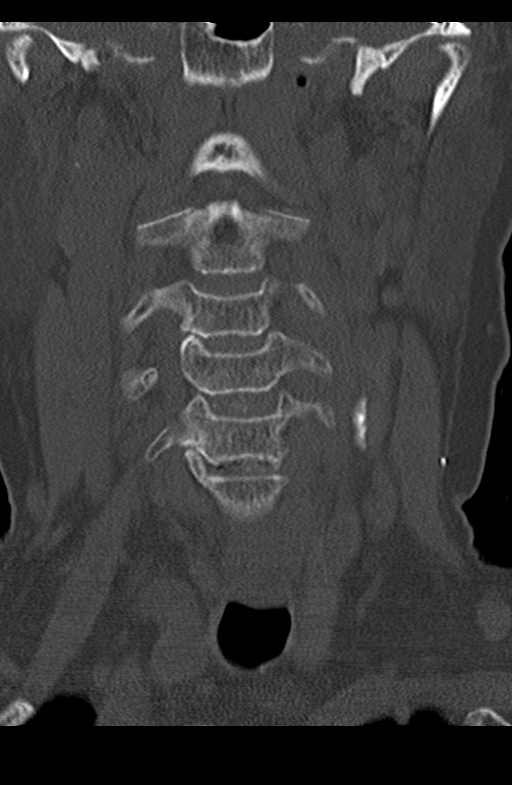
[im 27/66  bone]
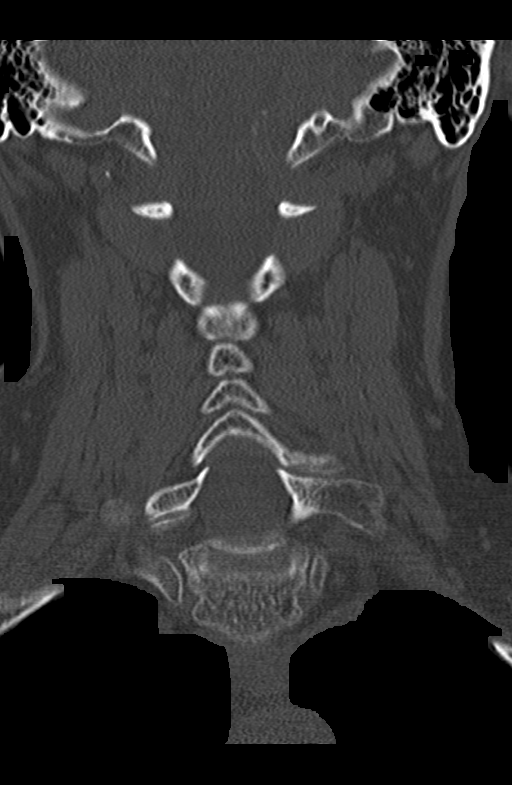
[im 40/66  bone]
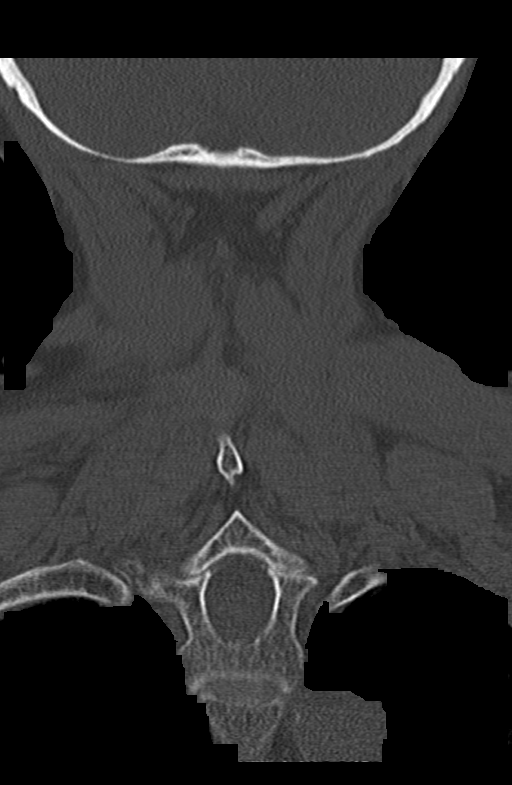

[Series 7: sagittal bone · sagittal · 0.30mm/px · 5 of 61 slices shown, 6 images]
[im 21/61  bone]
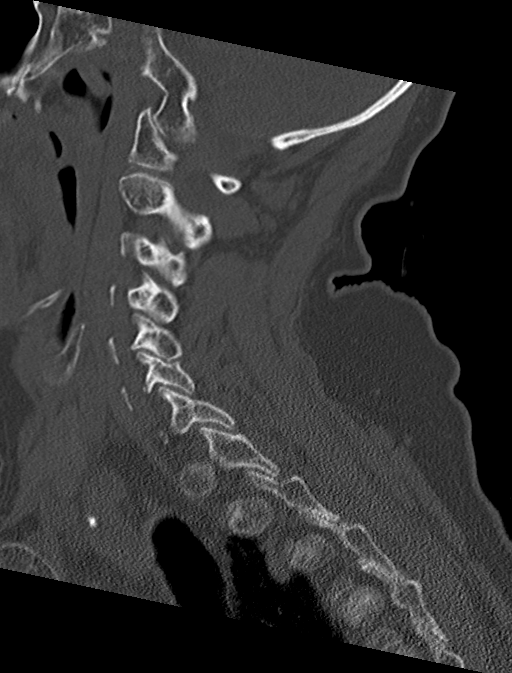
[im 26/61  bone]
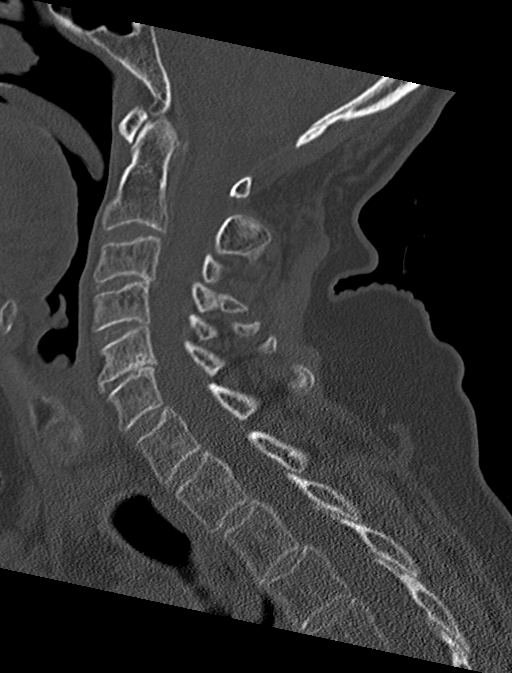
[im 31/61  soft-tissue]
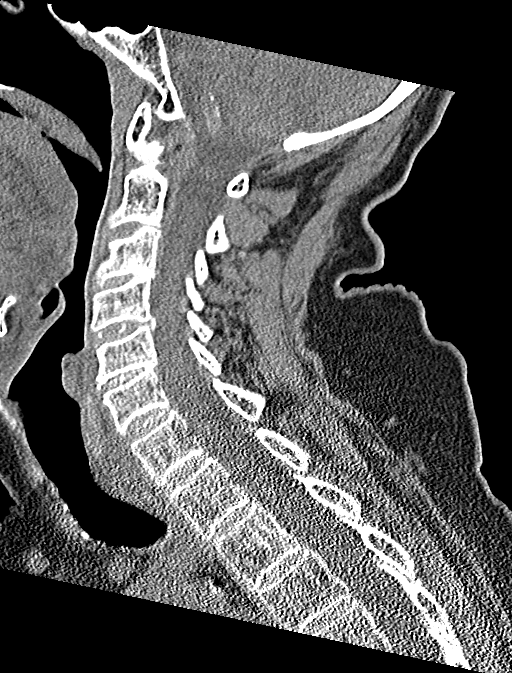
[im 31/61  bone]
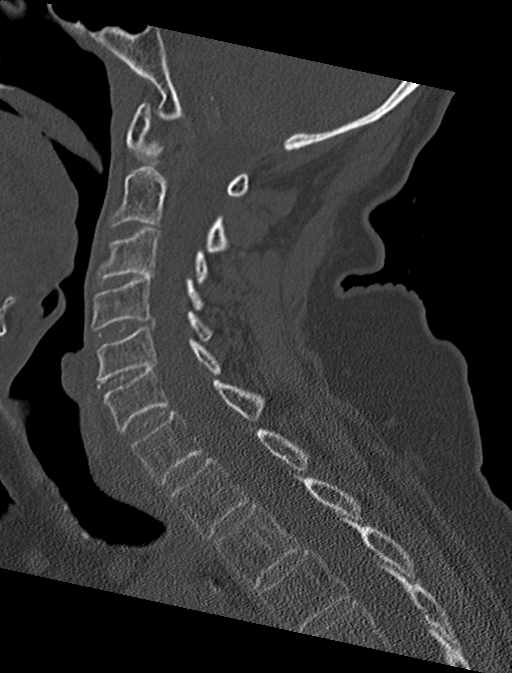
[im 36/61  bone]
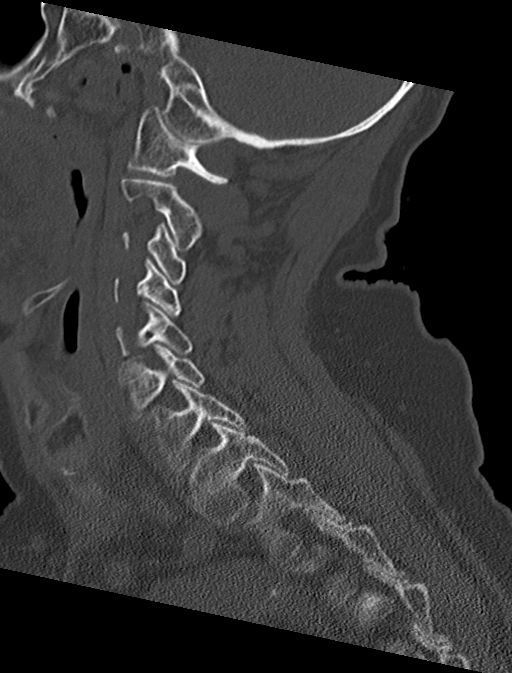
[im 41/61  bone]
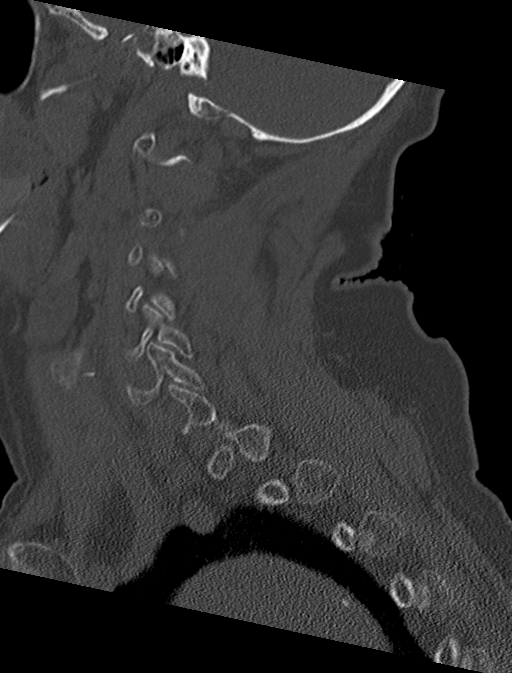

[13 of 33 positions shown; findings below may reference images not displayed]

FINDINGS: CT HEAD FINDINGS

Brain: No evidence of acute infarction, hemorrhage, hydrocephalus,
extra-axial collection or mass lesion/mass effect.

Vascular: No hyperdense vessel or unexpected calcification.

Skull: Intact.  No focal lesion.

Sinuses/Orbits: Status post cataract surgery.  Otherwise negative.

Other: None.

CT CERVICAL SPINE FINDINGS

Alignment: Maintained.

Skull base and vertebrae: No acute fracture. No primary bone lesion
or focal pathologic process.

Soft tissues and spinal canal: No prevertebral fluid or swelling. No
visible canal hematoma.

Disc levels: Loss of disc space height is most notable at C5-6 where
there is vacuum disc phenomenon. Mild disc bulging C3-4 and C4-5
also noted.

Upper chest: Negative.

Other: None.
IMPRESSION: Negative head CT.

No acute abnormality cervical spine.

Mild appearing cervical degenerative change.

## 2023-01-22 ENCOUNTER — Encounter: Payer: Self-pay | Admitting: Nurse Practitioner

## 2023-01-22 ENCOUNTER — Non-Acute Institutional Stay (SKILLED_NURSING_FACILITY): Payer: Medicare Other | Admitting: Family Medicine

## 2023-01-22 DIAGNOSIS — I10 Essential (primary) hypertension: Secondary | ICD-10-CM

## 2023-01-22 DIAGNOSIS — N1832 Chronic kidney disease, stage 3b: Secondary | ICD-10-CM

## 2023-01-22 DIAGNOSIS — F323 Major depressive disorder, single episode, severe with psychotic features: Secondary | ICD-10-CM | POA: Diagnosis not present

## 2023-01-22 DIAGNOSIS — L309 Dermatitis, unspecified: Secondary | ICD-10-CM | POA: Diagnosis not present

## 2023-01-22 DIAGNOSIS — M8000XA Age-related osteoporosis with current pathological fracture, unspecified site, initial encounter for fracture: Secondary | ICD-10-CM

## 2023-01-22 NOTE — Progress Notes (Signed)
Provider:  Jacalyn Lefevre, MD Location:      Place of Service:     PCP: [name], MD [name], MD as PCP - General (Internal Medicine) [name], MD (Ophthalmology) [name], MD (Psychiatry) Mast, Man X, NP as Nurse Practitioner (Nurse Practitioner) [name], MD as Consulting Physician (Orthopedic Surgery)  Extended Emergency Contact Information Primary Emergency Contact: Jean [name], [place] Work Phone: [phone] Relation: Son Secondary Emergency Contact: Jean [name] [place] Home Phone: [phone] Relation: Sister  Code Status:  Goals of Care: Advanced Directive information    01/22/2023   10:17 AM  Advanced Directives  Does Patient Have a Medical Advance Directive? Yes  Type of Estate agent of [place];Living will;Out of facility DNR (pink MOST or yellow form)  Does patient want to make changes to medical advance directive? No - Patient declined  Copy of Healthcare Power of Attorney in Chart? Yes - validated most recent copy scanned in chart (See row information)  Pre-existing out of facility DNR order (yellow form or pink MOST form) Pink MOST/Yellow Form most recent copy in chart - Physician notified to receive inpatient order      No chief complaint on file.   HPI: Patient is a 82 y.o. female seen today for medical management of chronic problems including dementia, hypertension, depression, and osteoporosis. Her only complaint today is chronic dermatitis.  This has been a longtime complaint without specific diagnosis or findings on exam.  She has Nizoral cream hydrocortisone cream all of which she says is not effective.  As well as Prolixin at bedtime for anxiety and behaviors.  She is on memantine for She is on Klonopin twice daily, and memantine for dementia. She denies any recent falls.  She endorses resting well and good  appetite.  She tells me she does not need a walker to ambulate around facility.  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, 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   Vitamin D deficiency    Past Surgical History:  Procedure Laterality Date   ABDOMINAL HYSTERECTOMY     ANAL RECTAL MANOMETRY N/A 07/05/2014   Procedure: ANO RECTAL MANOMETRY;  Surgeon: Romie Levee, MD;  Location: [facility];  Service: Endoscopy;  Laterality: N/A;   APPENDECTOMY     CAROTID ENDARTERECTOMY     COLONOSCOPY  2008, 2015   TONSILLECTOMY     UPPER GASTROINTESTINAL ENDOSCOPY  2002    reports that she has never smoked. She has never used smokeless tobacco. She reports that she does not drink alcohol and does not use drugs. Social History   Socioeconomic History  Marital status: Divorced    Spouse name: Not on file   Number of children: 2   Years of education: Not on file   Highest education level: Not on file  Occupational History   Occupation: retired    Associate Professor: RETIRED  Tobacco Use   Smoking status: Never   Smokeless tobacco: Never  Vaping Use   Vaping Use: Never used  Substance and Sexual Activity   Alcohol use: No    Alcohol/week: 0.0 standard drinks of alcohol   Drug use: No   Sexual activity: Never  Other Topics Concern   Not on file  Social History Narrative   HSG, business college   Married 59-59yrs/divorced   2  son-'60; 2 granddaughters   Work: Diplomatic Services operational officer, travel clerk   Lives-friends home in a studio apartment, moved to [place] 04/2013   Patient never smoked. Did have second hand smoke exposure childhood and work   Alcohol none   Walks with walker   Social Determinants of Health   Financial Resource Strain: Low Risk  (06/10/2018)   Overall Financial Resource Strain (CARDIA)    Difficulty of Paying Living Expenses: Not hard at all  Food Insecurity: No Food Insecurity (06/10/2018)   Hunger Vital Sign    Worried About Running Out of Food in the Last Year: Never true    Ran Out of Food in the Last Year: Never true  Transportation Needs: No Transportation Needs (06/10/2018)   PRAPARE - Administrator, Civil Service (Medical): No    Lack of Transportation (Non-Medical): No  Physical Activity: Sufficiently Active (06/10/2018)   Exercise Vital Sign    Days of Exercise per Week: 5 days    Minutes of Exercise per Session: 30 min  Stress: No Stress Concern Present (06/10/2018)   [employer] of Occupational Health - Occupational Stress Questionnaire    Feeling of Stress : Not at all  Social Connections: Moderately Isolated (06/10/2018)   Social Connection and Isolation Panel [NHANES]    Frequency of Communication with Friends and Family: Twice a week    Frequency of Social Gatherings with Friends and Family: Twice a week    Attends Religious Services: Never    Database administrator or Organizations: No    Attends Banker Meetings: Never    Marital Status: Divorced  Catering manager Violence: Not At Risk (06/10/2018)   Humiliation, Afraid, Rape, and Kick questionnaire    Fear of Current or Ex-Partner: No    Emotionally Abused: No    Physically Abused: No    Sexually Abused: No    Functional Status Survey:    Family History  Problem Relation Age of Onset   Alzheimer's disease Mother    Coronary artery disease Father    Heart attack Father    Heart disease Father     Anuerysm Father        AAA   Ovarian cancer Sister    Lung cancer Brother    Heart disease Brother    Anuerysm Brother        AAA   Diabetes Neg Hx    Colon cancer Neg Hx     Health Maintenance  Topic Date Due   COVID-19 Vaccine (8 - 2023-24 season) 05/25/2022   INFLUENZA VACCINE  04/25/2023   Medicare Annual Wellness (AWV)  10/10/2023   DTaP/Tdap/Td (3 - Tdap) 07/15/2029   Pneumonia Vaccine 29+ Years old  Completed   DEXA SCAN  Completed   Zoster Vaccines- Shingrix  Completed   HPV VACCINES  Aged Out    Allergies  Allergen Reactions   Biaxin [Clarithromycin]    Clarithromycin    Doxycycline    Fosamax [Alendronate Sodium]    Geodon [Ziprasidone Hydrochloride]    Lipitor [Atorvastatin Calcium]    Penicillins    Pravachol [Pravastatin Sodium]    Statins    Sulfonamide Derivatives    Zithromax [Azithromycin]    Zocor [Simvastatin]     Outpatient Encounter Medications as of 01/22/2023  Medication Sig   acetaminophen (TYLENOL) 500 MG tablet Take 1,000 mg by mouth every 8 (eight) hours as needed for fever, headache or mild pain. Give 500 mg by mouth three times a day related to PAIN, UNSPECIFIED   alum & mag hydroxide-simeth (MAALOX/MYLANTA) 200-200-20 MG/5ML suspension Take 30 mLs by mouth every 4 (four) hours as needed for indigestion or heartburn. Give 30 ml orally at bedtime   amitriptyline (ELAVIL) 50 MG tablet Take 50 mg by mouth daily.   Artificial Tears ophthalmic solution Place 1 drop into both eyes 3 (three) times daily. 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   Benzocaine (ORAJEL MT) Use as directed in the mouth or throat. 20-0.26-0.15 %; amt: SMALL AMOUNT Three Times A Day - PRN;  mucous membrane Special Instructions: Apply to right inner mouth sore TID PRN [DX: Other specified disorders of teeth and supporting structures]  Every 4 Hours - PRN;may self-administer   bisacodyl  (DULCOLAX) 10 MG suppository Place 10 mg rectally daily as needed for moderate constipation.   bisacodyl (DULCOLAX) 5 MG EC tablet Take 5 mg by mouth daily as needed for moderate constipation.   carbamide peroxide (DEBROX) 6.5 % OTIC solution 5 drops as needed (impaction). 5 drops to affected ear, otic (ear). At bedtime, PRN, Identify cerumen impaction by direct visualization by use of otoscope. Place 5 drops to affected ear QHS x3 nights then irrigate with bulb syringe. If not effective notify MD.   clonazePAM (KLONOPIN) 0.25 MG disintegrating tablet Take 1 tablet (0.25 mg total) by mouth 2 (two) times daily. Morning and evening   diclofenac Sodium (VOLTAREN) 1 % GEL Apply 2 g topically 2 (two) times daily as needed (pain). apply to lower back area   famotidine (PEPCID) 20 MG tablet Take 20 mg by mouth at bedtime.   fluPHENAZine (PROLIXIN) 1 MG tablet Take 1 mg by mouth at bedtime.   furosemide (LASIX) 20 MG tablet Take 20 mg by mouth daily.   guaiFENesin (MUCINEX) 600 MG 12 hr tablet Take 600 mg by mouth daily as needed (Cold or cogestion).   hydrocortisone valerate cream (WESTCORT) 0.2 % Apply to rash on face and neck topically every 12 hours as needed for Rash related to Rash and other nonspecific skin eruption   ketoconazole (NIZORAL) 2 % cream Apply 1 application  topically 2 (two) times daily. APPLY TO RASH ON FACE AND NECK TWICE DAILY AS NEEDED   ketoconazole (NIZORAL) 2 % shampoo Apply 1 application. topically 2 (two) times a week. Tuesday and Friday   loratadine (CLARITIN) 10 MG tablet Take 10 mg by mouth daily.   magnesium hydroxide (MILK OF MAGNESIA) 400 MG/5ML suspension Take 30 mLs by mouth daily as needed for mild constipation.   memantine (NAMENDA) 10 MG tablet Take 10 mg by mouth 2 (two) times daily.   Multiple Vitamins-Minerals (SENTRY SENIOR) TABS Take 1 tablet by mouth daily. 500-300-250 mcg [DX:  Vitamin deficiency, unspecified]   OYSTER SHELL PO Take 250 mg by mouth in the  morning and at bedtime.   Polyethylene Glycol 3350 (MIRALAX PO) Take 17 g by mouth See admin instructions. Take 17 gm, oral twice a day, every other day. Miralax 17 gm + 4-8oz fluid mizture BID   saccharomyces boulardii (FLORASTOR) 250 MG capsule Take 250 mg by mouth daily.   spironolactone (ALDACTONE) 25 MG tablet Take 12.5 mg by mouth daily.   triamcinolone cream (KENALOG) 0.1 % Apply 1 application  topically as needed (rash). Apply BID as needed to rash to chest, behind L ear, left posterior neck.   No facility-administered encounter medications on file as of 01/22/2023.    Review of Systems  Constitutional: Negative.   HENT: Negative.    Eyes:  Positive for visual disturbance.  Respiratory: Negative.    Cardiovascular: Negative.   Gastrointestinal: Negative.   Genitourinary: Negative.   Musculoskeletal: Negative.   Psychiatric/Behavioral:  Positive for confusion.   All other systems reviewed and are negative.   There were no vitals filed for this visit. There is no height or weight on file to calculate BMI. Physical Exam Vitals and nursing note reviewed.  Constitutional:      Appearance: Normal appearance.  HENT:     Mouth/Throat:     Mouth: Mucous membranes are moist.  Cardiovascular:     Rate and Rhythm: Normal rate and regular rhythm.  Pulmonary:     Effort: Pulmonary effort is normal.     Breath sounds: Normal breath sounds.  Abdominal:     General: Bowel sounds are normal.     Palpations: Abdomen is soft.  Musculoskeletal:        General: Normal range of motion.  Neurological:     General: No focal deficit present.     Mental Status: She is alert and oriented to person, place, and time.     Labs reviewed: Basic Metabolic Panel: Recent Labs    06/07/22 1646 06/08/22 0032 06/09/22 0504 06/19/22 0000  NA 138 139 141 138  K 5.0 4.8 4.3 4.0  CL 103 104 107 100  CO2  --  25 22 24*  GLUCOSE 97 102* 99  --   BUN 27* 21 14 21   CREATININE 1.00 0.92 0.95 1.0   CALCIUM  --  9.1 9.4 9.4  MG  --  2.3 2.1  --   PHOS  --  3.8 3.3  --    Liver Function Tests: Recent Labs    06/07/22 1641 06/08/22 0032 06/09/22 0504 06/19/22 0000  AST 29 34  --  20  ALT 18 19  --  14  ALKPHOS 100 98  --  123  BILITOT 0.3 0.6  --   --   PROT 7.2 6.7  --   --   ALBUMIN 4.0 3.8 3.5 4.2   No results for input(s): "LIPASE", "AMYLASE" in the last 8760 hours. No results for input(s): "AMMONIA" in the last 8760 hours. CBC: Recent Labs    04/26/22 0000 04/26/22 0000 06/07/22 1641 06/07/22 1646 06/08/22 0032 06/09/22 0504 06/19/22 0000  WBC 5.9   < > 6.4  --  9.2 7.4 7.2  NEUTROABS 3,039.00  --  3.1  --   --   --  5,134.00  HGB 13.6  --  15.1*   < > 12.8 13.4 13.3  HCT 40  --  46.3*   < > 39.3 39.7 40  MCV  --   --  96.9  --  95.6 95.4  --   PLT 353  --  303  --  333 304 362   < > = values in this interval not displayed.   Cardiac Enzymes: No results for input(s): "CKTOTAL", "CKMB", "CKMBINDEX", "TROPONINI" in the last 8760 hours. BNP: Invalid input(s): "POCBNP" Lab Results  Component Value Date   HGBA1C 5.6 06/08/2022   Lab Results  Component Value Date   TSH 1.62 08/15/2021   Lab Results  Component Value Date   VITAMINB12 404 12/31/2010   Lab Results  Component Value Date   FOLATE 12.6 05/11/2010   No results found for: "IRON", "TIBC", "FERRITIN"  Imaging and Procedures obtained prior to SNF admission: ECHOCARDIOGRAM COMPLETE  Result Date: 06/08/2022    ECHOCARDIOGRAM REPORT   Patient Name:   Jean [name] Date of Exam: 06/08/2022 Medical Rec #:  [id number]      Height:       65.0 in Accession #:    [id number]     Weight:       151.7 lb Date of Birth:  04-Dec-1940     BSA:          1.759 m Patient Age:    80 years       BP:           161/48 mmHg Patient Gender: F              HR:           86 bpm. Exam Location:  Inpatient Procedure: 2D Echo, Cardiac Doppler and Color Doppler Indications:    TIA  History:        Patient has no prior history  of Echocardiogram examinations.                 Risk Factors:Dyslipidemia and Hypertension. PVD. CKD.  Sonographer:    Ross Ludwig RDCS (AE) Referring Phys: Jefferson Fuel  Sonographer Comments: Technically difficult study due to poor echo windows. IMPRESSIONS  1. Left ventricular ejection fraction, by estimation, is 55 to 60%. The left ventricle has normal function. Left ventricular endocardial border not optimally defined to evaluate regional wall motion. Left ventricular diastolic parameters are indeterminate.  2. Right ventricular systolic function is normal. The right ventricular size is normal.  3. The mitral valve was not well visualized. No evidence of mitral valve regurgitation. No evidence of mitral stenosis.  4. The aortic valve was not well visualized. Aortic valve regurgitation is not visualized. No aortic stenosis is present. Comparison(s): No prior Echocardiogram. Conclusion(s)/Recommendation(s): Very limited imaged due to poor echo windows. Grossly normal RV and LV function. No severe valve disease by Doppler, but valves not well visualized. Consider TEE if indicated to exclude cardioembolic source. FINDINGS  Left Ventricle: Left ventricular ejection fraction, by estimation, is 55 to 60%. The left ventricle has normal function. Left ventricular endocardial border not optimally defined to evaluate regional wall motion. The left ventricular internal cavity size was normal in size. There is borderline left ventricular hypertrophy. Left ventricular diastolic parameters are indeterminate. Right Ventricle: The right ventricular size is normal. Right vetricular wall thickness was not well visualized. Right ventricular systolic function is normal. Left Atrium: Left atrial size was normal in size. Right Atrium: Right atrial size was normal in size. Pericardium: There is no evidence of pericardial effusion. Presence of epicardial fat layer. Mitral Valve: The mitral valve was not well visualized. No evidence of  mitral valve regurgitation. No evidence of mitral valve stenosis.  Tricuspid Valve: The tricuspid valve is not well visualized. Tricuspid valve regurgitation is trivial. No evidence of tricuspid stenosis. Aortic Valve: The aortic valve was not well visualized. Aortic valve regurgitation is not visualized. No aortic stenosis is present. Aortic valve mean gradient measures 3.0 mmHg. Aortic valve peak gradient measures 3.9 mmHg. Aortic valve area, by VTI measures 3.60 cm. Pulmonic Valve: The pulmonic valve was not well visualized. Pulmonic valve regurgitation is not visualized. Aorta: The aortic root and ascending aorta are structurally normal, with no evidence of dilitation. Venous: The inferior vena cava was not well visualized. IAS/Shunts: The interatrial septum was not well visualized.  LEFT VENTRICLE PLAX 2D LVIDd:         4.40 cm   Diastology LV PW:         1.30 cm   LV e' medial:    3.48 cm/s LV IVS:        1.10 cm   LV E/e' medial:  16.3 LVOT diam:     2.30 cm   LV e' lateral:   7.72 cm/s LV SV:         74        LV E/e' lateral: 7.3 LV SV Index:   42 LVOT Area:     4.15 cm  RIGHT VENTRICLE RV Basal diam:  2.70 cm RV S prime:     12.80 cm/s TAPSE (M-mode): 1.4 cm LEFT ATRIUM             Index        RIGHT ATRIUM           Index LA diam:        4.20 cm 2.39 cm/m   RA Area:     12.60 cm LA Vol (A2C):   19.7 ml 11.20 ml/m  RA Volume:   29.70 ml  16.89 ml/m LA Vol (A4C):   38.9 ml 22.12 ml/m LA Biplane Vol: 30.7 ml 17.46 ml/m  AORTIC VALVE AV Area (Vmax):    3.35 cm AV Area (Vmean):   3.48 cm AV Area (VTI):     3.60 cm AV Vmax:           99.10 cm/s AV Vmean:          74.900 cm/s AV VTI:            0.204 m AV Peak Grad:      3.9 mmHg AV Mean Grad:      3.0 mmHg LVOT Vmax:         80.00 cm/s LVOT Vmean:        62.800 cm/s LVOT VTI:          0.177 m LVOT/AV VTI ratio: 0.87  AORTA Ao Root diam: 2.70 cm Ao Asc diam:  2.90 cm MITRAL VALVE MV Area (PHT): 3.19 cm    SHUNTS MV Decel Time: 238 msec    Systemic  VTI:  0.18 m MV E velocity: 56.70 cm/s  Systemic Diam: 2.30 cm MV A velocity: 96.20 cm/s MV E/A ratio:  0.59 Jodelle Red MD Electronically signed by Jodelle Red MD Signature Date/Time: 06/08/2022/6:43:11 PM    Final    EEG adult  Result Date: 06/08/2022 Charlsie Quest, MD     06/08/2022  5:14 AM Patient Name: EVETTA RENNER MRN: [id number] Epilepsy Attending: Charlsie Quest Referring Physician/Provider: Jefferson Fuel, MD Date: 06/08/2022 Duration: 21.48 mins Patient history: 82yo F with ams. EEG to evaluate for seizure. Level of alertness: Awake AEDs during  EEG study: Ativan, Librium Technical aspects: This EEG study was done with scalp electrodes positioned according to the 10-20 International system of electrode placement. Electrical activity was reviewed with band pass filter of 1-70Hz , sensitivity of 7 uV/mm, display speed of 78mm/sec with a 60Hz  notched filter applied as appropriate. EEG data were recorded continuously and digitally stored.  Video monitoring was available and reviewed as appropriate. Description: No posterior dominant rhythm was seen. There was an excessive amount of 15 to 18 Hz beta activity distributed symmetrically and diffusely.  Hyperventilation and photic stimulation were not performed.   ABNORMALITY - Excessive beta, generalized IMPRESSION: This study is within normal limits. The excessive beta activity seen in the background is most likely due to the effect of benzodiazepine and is a benign EEG pattern. No seizures or epileptiform discharges were seen throughout the recording. A normal interictal EEG does not exclude nor support the diagnosis of epilepsy. Charlsie Quest   MR BRAIN W WO CONTRAST  Result Date: 06/08/2022 CLINICAL DATA:  Transient ischemic attack.  New onset seizure. EXAM: MRI HEAD WITHOUT AND WITH CONTRAST MRA HEAD WITHOUT CONTRAST MRA NECK WITHOUT AND WITH CONTRAST TECHNIQUE: Multiplanar, multiecho pulse sequences of the brain and  surrounding structures were obtained without and with intravenous contrast. Angiographic images of the Circle of Willis were obtained using MRA technique without intravenous contrast. Angiographic images of the neck were obtained using MRA technique without and with intravenous contrast. Carotid stenosis measurements (when applicable) are obtained utilizing NASCET criteria, using the distal internal carotid diameter as the denominator. CONTRAST:  6.5 mL gadopiclenol (Vueway) 0.5 mmol/ml solution COMPARISON:  None Available. FINDINGS: MRI HEAD FINDINGS Brain: No acute infarct, mass effect or extra-axial collection. No acute or chronic hemorrhage. There is multifocal hyperintense T2-weighted signal within the white matter. Generalized volume loss. The midline structures are normal. There is no abnormal contrast enhancement. Vascular: Major flow voids are preserved. Skull and upper cervical spine: Normal calvarium and skull base. Visualized upper cervical spine and soft tissues are normal. Sinuses/Orbits:No paranasal sinus fluid levels or advanced mucosal thickening. No mastoid or middle ear effusion. Normal orbits. MRA HEAD FINDINGS POSTERIOR CIRCULATION: --Vertebral arteries: Normal --Inferior cerebellar arteries: Normal. --Basilar artery: Normal. --Superior cerebellar arteries: Normal. --Posterior cerebral arteries: Normal. ANTERIOR CIRCULATION: --Intracranial internal carotid arteries: Normal. --Anterior cerebral arteries (ACA): Normal. --Middle cerebral arteries (MCA): At the right MCA bifurcation, there is a laterally projecting outpouching that measures 2 mm. This may be a small aneurysm or a chronically occluded branch vessel. Otherwise, the middle cerebral arteries are normal. ANATOMIC VARIANTS: None MRA NECK FINDINGS Left dominant vertebral arteries are patent and of normal caliber. Both carotid systems are normal without stenosis. IMPRESSION: 1. No acute intracranial abnormality. 2. Mild chronic small vessel  disease and volume loss. 3. No emergent large vessel occlusion or high-grade stenosis. 4. A 2 mm laterally projecting outpouching at the right MCA bifurcation may be a small aneurysm or an occluded branch vessel. Given the lack of acute ischemia, this is unlikely to be an acute occlusion. Electronically Signed   By: Deatra Robinson M.D.   On: 06/08/2022 01:42   MR ANGIO HEAD WO CONTRAST  Result Date: 06/08/2022 CLINICAL DATA:  Transient ischemic attack.  New onset seizure. EXAM: MRI HEAD WITHOUT AND WITH CONTRAST MRA HEAD WITHOUT CONTRAST MRA NECK WITHOUT AND WITH CONTRAST TECHNIQUE: Multiplanar, multiecho pulse sequences of the brain and surrounding structures were obtained without and with intravenous contrast. Angiographic images of the Circle of Willis were obtained  using MRA technique without intravenous contrast. Angiographic images of the neck were obtained using MRA technique without and with intravenous contrast. Carotid stenosis measurements (when applicable) are obtained utilizing NASCET criteria, using the distal internal carotid diameter as the denominator. CONTRAST:  6.5 mL gadopiclenol (Vueway) 0.5 mmol/ml solution COMPARISON:  None Available. FINDINGS: MRI HEAD FINDINGS Brain: No acute infarct, mass effect or extra-axial collection. No acute or chronic hemorrhage. There is multifocal hyperintense T2-weighted signal within the white matter. Generalized volume loss. The midline structures are normal. There is no abnormal contrast enhancement. Vascular: Major flow voids are preserved. Skull and upper cervical spine: Normal calvarium and skull base. Visualized upper cervical spine and soft tissues are normal. Sinuses/Orbits:No paranasal sinus fluid levels or advanced mucosal thickening. No mastoid or middle ear effusion. Normal orbits. MRA HEAD FINDINGS POSTERIOR CIRCULATION: --Vertebral arteries: Normal --Inferior cerebellar arteries: Normal. --Basilar artery: Normal. --Superior cerebellar arteries:  Normal. --Posterior cerebral arteries: Normal. ANTERIOR CIRCULATION: --Intracranial internal carotid arteries: Normal. --Anterior cerebral arteries (ACA): Normal. --Middle cerebral arteries (MCA): At the right MCA bifurcation, there is a laterally projecting outpouching that measures 2 mm. This may be a small aneurysm or a chronically occluded branch vessel. Otherwise, the middle cerebral arteries are normal. ANATOMIC VARIANTS: None MRA NECK FINDINGS Left dominant vertebral arteries are patent and of normal caliber. Both carotid systems are normal without stenosis. IMPRESSION: 1. No acute intracranial abnormality. 2. 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 3. No emergent large vessel occlusion or high-grade stenosis. 4. A 2 mm laterally projecting outpouching at the right MCA bifurcation may be a small aneurysm or an occluded branch vessel. Given the lack of acute ischemia, this is unlikely to be an acute occlusion. Electronically Signed   By: Deatra Robinson M.D.   On: 06/08/2022 01:42   MR ANGIO NECK W WO CONTRAST  Result Date: 06/08/2022 CLINICAL DATA:  Transient ischemic attack.  New onset seizure. EXAM: MRI HEAD WITHOUT AND WITH CONTRAST MRA HEAD WITHOUT CONTRAST MRA NECK WITHOUT AND WITH CONTRAST TECHNIQUE: Multiplanar, multiecho pulse sequences of the brain and surrounding structures were obtained without and with intravenous contrast. Angiographic images of the Circle of Willis were obtained using MRA technique without intravenous contrast. Angiographic images of the neck were obtained using MRA technique without and with intravenous contrast. Carotid stenosis measurements (when applicable) are obtained utilizing NASCET criteria, using the distal internal carotid diameter as the denominator. CONTRAST:  6.5 mL gadopiclenol (Vueway) 0.5 mmol/ml solution COMPARISON:  None Available. FINDINGS: MRI HEAD FINDINGS Brain: No acute infarct, mass effect or extra-axial collection. No acute or chronic  hemorrhage. There is multifocal hyperintense T2-weighted signal within the white matter. Generalized volume loss. The midline structures are normal. There is no abnormal contrast enhancement. Vascular: Major flow voids are preserved. Skull and upper cervical spine: Normal calvarium and skull base. Visualized upper cervical spine and soft tissues are normal. Sinuses/Orbits:No paranasal sinus fluid levels or advanced mucosal thickening. No mastoid or middle ear effusion. Normal orbits. MRA HEAD FINDINGS POSTERIOR CIRCULATION: --Vertebral arteries: Normal --Inferior cerebellar arteries: Normal. --Basilar artery: Normal. --Superior cerebellar arteries: Normal. --Posterior cerebral arteries: Normal. ANTERIOR CIRCULATION: --Intracranial internal carotid arteries: Normal. --Anterior cerebral arteries (ACA): Normal. --Middle cerebral arteries (MCA): At the right MCA bifurcation, there is a laterally projecting outpouching that measures 2 mm. This may be a small aneurysm or a chronically occluded branch vessel. Otherwise, the middle cerebral arteries are normal. ANATOMIC VARIANTS: None MRA NECK FINDINGS Left dominant vertebral arteries are patent and of normal  caliber. Both carotid systems are normal without stenosis. IMPRESSION: 1. No acute intracranial abnormality. 2. 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 3. No emergent large vessel occlusion or high-grade stenosis. 4. A 2 mm laterally projecting outpouching at the right MCA bifurcation may be a small aneurysm or an occluded branch vessel. Given the lack of acute ischemia, this is unlikely to be an acute occlusion. Electronically Signed   By: Deatra Robinson M.D.   On: 06/08/2022 01:42   CT HEAD CODE STROKE WO CONTRAST  Result Date: 06/07/2022 CLINICAL DATA:  Code stroke.  Neuro deficit, acute, stroke suspected EXAM: CT HEAD WITHOUT CONTRAST TECHNIQUE: Contiguous axial images were obtained from the base of the skull through the vertex without intravenous  contrast. RADIATION DOSE REDUCTION: This exam was performed according to the departmental dose-optimization program which includes automated exposure control, adjustment of the mA and/or kV according to patient size and/or use of iterative reconstruction technique. COMPARISON:  CT head March 10, 2021. FINDINGS: Brain: No evidence of acute large vascular territory infarction, hemorrhage, hydrocephalus, extra-axial collection or mass lesion/mass effect. Mild for age patchy white matter hypoattenuation, nonspecific but compatible with chronic microvascular ischemic disease. Vascular: No hyperdense vessel identified. Calcific intracranial atherosclerosis. Skull: No acute fracture. Sinuses/Orbits: No acute findings. ASPECTS [facility] Stroke Program Early CT Score) total score (0-10 with 10 being normal): 10. IMPRESSION: 1. No evidence of acute intracranial abnormality. 2. ASPECTS is 10. Code stroke imaging results were communicated on 06/07/2022 at 4:52 pm to provider Dr. Selina Cooley Via secure text paging. Electronically Signed   By: Feliberto Harts M.D.   On: 06/07/2022 16:52    Assessment/Plan 1. Stage 3b chronic kidney disease (HCC) Renal function studies are stable  2. Depression, psychotic (HCC) On no specific antidepressants but does take Prolixin  3. Dermatitis Exam is negative for dermatitis.  She does have several topical medicines which are used  4. Essential hypertension Only medicine for blood pressure is spironolactone and Lasix  5. Age-related osteoporosis with current pathological fracture, initial encounter Patient had previously been on Prolia but this was discontinued due to expense    Family/ staff Communication:   Labs/tests ordered:  .smmsig

## 2023-01-22 NOTE — Progress Notes (Signed)
Location:  [facility] Room Number: 101-A Place of Service:  SNF [phone]) Provider: Man Hanley Seamen, MD  [name], MD as PCP - General (Internal Medicine) [name], MD (Ophthalmology) [name], MD (Psychiatry) Mast, Man X, NP as Nurse Practitioner (Nurse Practitioner) [name], MD as Consulting Physician (Orthopedic Surgery)  Extended Emergency Contact Information Primary Emergency Contact: [name], [place] Work Phone: [phone] Relation: Son Secondary Emergency Contact: [name] Home Phone: [phone] Relation: Sister  Code Status:  DNR Goals of care: Advanced Directive information    01/22/2023   10:17 AM  Advanced Directives  Does Patient Have a Medical Advance Directive? Yes  Type of Estate agent of [place];Living will;Out of facility DNR (pink MOST or yellow form)  Does patient want to make changes to medical advance directive? No - Patient declined  Copy of Healthcare Power of Attorney in Chart? Yes - validated most recent copy scanned in chart (See row information)  Pre-existing out of facility DNR order (yellow form or pink MOST form) Pink MOST/Yellow Form most recent copy in chart - Physician notified to receive inpatient order     Chief Complaint  Patient presents with   Routine    HPI:  Pt is a 82 y.o. female seen today for medical management of chronic diseases.        Past Medical History:  Diagnosis Date   Allergy    Anemia    Anxiety    Asthma    CAD (coronary artery disease)    Depression    Diverticulosis    Dysfunction of eustachian tube    Elevated LFTs    Esophageal reflux    Esophageal stricture 2002   Hemorrhoids    History of rectal polyps    HLD (hyperlipidemia)    HTN (hypertension)    Hypertonicity of bladder    Irritable bowel syndrome with  constipation    Obesity    Optic neuritis    Osteoporosis    Peptic ulcer disease    Peripheral neuropathy    Trigeminal neuralgia    prior injury   Visual loss, bilateral    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, 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   Vitamin D deficiency    Past Surgical History:  Procedure Laterality Date   ABDOMINAL HYSTERECTOMY     ANAL RECTAL MANOMETRY N/A 07/05/2014   Procedure: ANO RECTAL MANOMETRY;  Surgeon: Romie Levee, MD;  Location: [facility];  Service: Endoscopy;  Laterality: N/A;   APPENDECTOMY     CAROTID ENDARTERECTOMY     COLONOSCOPY  2008, 2015   TONSILLECTOMY     UPPER GASTROINTESTINAL ENDOSCOPY  2002    Allergies  Allergen Reactions   Biaxin [Clarithromycin]    Clarithromycin    Doxycycline    Fosamax [Alendronate Sodium]    Geodon [Ziprasidone Hydrochloride]    Lipitor [Atorvastatin Calcium]    Penicillins    Pravachol [Pravastatin Sodium]    Statins    Sulfonamide Derivatives    Zithromax [Azithromycin]    Zocor [Simvastatin]     Outpatient Encounter Medications as of 01/22/2023  Medication Sig   acetaminophen (TYLENOL) 500 MG tablet Take 1,000 mg by mouth every 8 (eight) hours as needed for fever, headache or mild pain. Give 500 mg by mouth three times a day related to PAIN, UNSPECIFIED  alum & mag hydroxide-simeth (MAALOX/MYLANTA) 200-200-20 MG/5ML suspension Take 30 mLs by mouth every 4 (four) hours as needed for indigestion or heartburn. Give 30 ml orally at bedtime   amitriptyline (ELAVIL) 50 MG tablet Take 50 mg by mouth daily.   Artificial Tears ophthalmic solution Place 1 drop into both eyes 3 (three) times daily. 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   Benzocaine (ORAJEL MT) Use as directed in the mouth or throat. 20-0.26-0.15 %; amt: SMALL AMOUNT Three Times A Day - PRN;  mucous membrane Special Instructions: Apply to right inner mouth sore  TID PRN [DX: Other specified disorders of teeth and supporting structures]  Every 4 Hours - PRN;may self-administer   bisacodyl (DULCOLAX) 10 MG suppository Place 10 mg rectally daily as needed for moderate constipation.   bisacodyl (DULCOLAX) 5 MG EC tablet Take 5 mg by mouth daily as needed for moderate constipation.   carbamide peroxide (DEBROX) 6.5 % OTIC solution 5 drops as needed (impaction). 5 drops to affected ear, otic (ear). At bedtime, PRN, Identify cerumen impaction by direct visualization by use of otoscope. Place 5 drops to affected ear QHS x3 nights then irrigate with bulb syringe. If not effective notify MD.   clonazePAM (KLONOPIN) 0.25 MG disintegrating tablet Take 1 tablet (0.25 mg total) by mouth 2 (two) times daily. Morning and evening   diclofenac Sodium (VOLTAREN) 1 % GEL Apply 2 g topically 2 (two) times daily as needed (pain). apply to lower back area   famotidine (PEPCID) 20 MG tablet Take 20 mg by mouth at bedtime.   fluPHENAZine (PROLIXIN) 1 MG tablet Take 1 mg by mouth at bedtime.   furosemide (LASIX) 20 MG tablet Take 20 mg by mouth daily.   guaiFENesin (MUCINEX) 600 MG 12 hr tablet Take 600 mg by mouth daily as needed (Cold or cogestion).   hydrocortisone valerate cream (WESTCORT) 0.2 % Apply to rash on face and neck topically every 12 hours as needed for Rash related to Rash and other nonspecific skin eruption   ketoconazole (NIZORAL) 2 % cream Apply 1 application  topically 2 (two) times daily. APPLY TO RASH ON FACE AND NECK TWICE DAILY AS NEEDED   ketoconazole (NIZORAL) 2 % shampoo Apply 1 application. topically 2 (two) times a week. Tuesday and Friday   loratadine (CLARITIN) 10 MG tablet Take 10 mg by mouth daily.   magnesium hydroxide (MILK OF MAGNESIA) 400 MG/5ML suspension Take 30 mLs by mouth daily as needed for mild constipation.   memantine (NAMENDA) 10 MG tablet Take 10 mg by mouth 2 (two) times daily.   Multiple Vitamins-Minerals (SENTRY SENIOR) TABS Take  1 tablet by mouth daily. 500-300-250 mcg [DX: Vitamin deficiency, unspecified]   OYSTER SHELL PO Take 250 mg by mouth in the morning and at bedtime.   Polyethylene Glycol 3350 (MIRALAX PO) Take 17 g by mouth See admin instructions. Take 17 gm, oral twice a day, every other day. Miralax 17 gm + 4-8oz fluid mizture BID   saccharomyces boulardii (FLORASTOR) 250 MG capsule Take 250 mg by mouth daily.   spironolactone (ALDACTONE) 25 MG tablet Take 12.5 mg by mouth daily.   triamcinolone cream (KENALOG) 0.1 % Apply 1 application  topically as needed (rash). Apply BID as needed to rash to chest, behind L ear, left posterior neck.   No facility-administered encounter medications on file as of 01/22/2023.    Review of Systems  Immunization History  Administered Date(s) Administered   Influenza Split 07/23/2013   Influenza Whole 07/05/2009, 06/26/2018   Influenza-Unspecified 07/29/2014, 06/14/2015, 07/11/2016, 07/15/2017, 06/27/2019, 07/05/2020, 07/13/2021, 07/21/2022   Moderna SARS-COV2 Booster Vaccination 02/09/2022   Moderna Sars-Covid-2 Vaccination 09/26/2019, 10/24/2019, 08/02/2020   PFIZER(Purple Top)SARS-COV-2 Vaccination 09/26/2019   PPD Test 06/17/2013   Pneumococcal Conjugate-13 07/22/2017   Pneumococcal Polysaccharide-23 05/05/2009   Respiratory Syncytial Virus Vaccine,Recomb Aduvanted(Arexvy) 10/12/2022   Td 05/05/2009, 07/16/2019   Unspecified SARS-COV-2 Vaccination 02/21/2021, 06/13/2021   Zoster Recombinat (Shingrix) 09/27/2021, 12/05/2021   Zoster, Live 01/09/2011   Pertinent  Health Maintenance Due  Topic Date Due   INFLUENZA VACCINE  04/25/2023   DEXA SCAN  Completed      06/08/2022    5:57 PM 06/08/2022   11:00 PM 06/09/2022   10:00 AM 11/23/2022   11:33 AM 12/17/2022    3:49 PM  Fall Risk  Falls in the past year?    1 0  Was there an injury with Fall?    1 0  Fall Risk Category Calculator    2 0  (RETIRED) Patient Fall Risk Level High fall risk High fall risk High  fall risk    Patient at Risk for Falls Due to    History of fall(s);Impaired balance/gait History of fall(s);Impaired balance/gait  Fall risk Follow up    Falls evaluation completed Falls evaluation completed   Functional Status Survey:    Vitals:   01/22/23 1007  BP: 114/74  Pulse: 70  Resp: 18  Temp: (!) 97.1 F (36.2 C)  SpO2: 97%  Weight: 126 lb 3.2 oz (57.2 kg)  Height: 5\' 5"  (1.651 m)   Body mass index is 21 kg/m. Physical Exam  Labs reviewed: Recent Labs    06/07/22 1646 06/08/22 0032 06/09/22 0504 06/19/22 0000  NA 138 139 141 138  K 5.0 4.8 4.3 4.0  CL 103 104 107 100  CO2  --  25 22 24*  GLUCOSE 97 102* 99  --   BUN 27* 21 14 21   CREATININE 1.00 0.92 0.95 1.0  CALCIUM  --  9.1 9.4 9.4  MG  --  2.3 2.1  --   PHOS  --  3.8 3.3  --    Recent Labs    06/07/22 1641 06/08/22 0032 06/09/22 0504 06/19/22 0000  AST 29 34  --  20  ALT 18 19  --  14  ALKPHOS 100 98  --  123  BILITOT 0.3 0.6  --   --   PROT 7.2 6.7  --   --   ALBUMIN 4.0 3.8 3.5 4.2   Recent Labs    04/26/22 0000 04/26/22 0000 06/07/22 1641 06/07/22 1646 06/08/22 0032 06/09/22 0504 06/19/22 0000  WBC 5.9   < > 6.4  --  9.2 7.4 7.2  NEUTROABS 3,039.00  --  3.1  --   --   --  5,134.00  HGB 13.6  --  15.1*   < > 12.8 13.4 13.3  HCT 40  --  46.3*   < > 39.3 39.7 40  MCV  --   --  96.9  --  95.6 95.4  --   PLT 353  --  303  --  333 304 362   < > = values in this interval not displayed.   Lab Results  Component Value Date   TSH 1.62 08/15/2021   Lab Results  Component Value Date   HGBA1C 5.6 06/08/2022   Lab Results  Component Value Date   CHOL 201 (H) 06/08/2022   HDL 71 06/08/2022   LDLCALC 113 (H) 06/08/2022   LDLDIRECT 190.0 03/24/2012   TRIG 84 06/08/2022   CHOLHDL 2.8 06/08/2022    Significant Diagnostic Results in last 30 days:  No results found.  Assessment/Plan No problem-specific Assessment & Plan notes found for this encounter.     Family/ staff  Communication: plan of care reviewed with the patient and charge nurse.   Labs/tests ordered:  none  Time spend 35 minutes    This encounter was created in error - please disregard.

## 2023-02-06 ENCOUNTER — Other Ambulatory Visit: Payer: Self-pay | Admitting: Adult Health

## 2023-02-06 MED ORDER — CLONAZEPAM 0.25 MG PO TBDP: 0.2500 mg | ORAL_TABLET | Freq: Two times a day (BID) | ORAL | 0 refills | Status: DC

## 2023-02-15 ENCOUNTER — Non-Acute Institutional Stay (SKILLED_NURSING_FACILITY): Payer: Medicare Other | Admitting: Nurse Practitioner

## 2023-02-15 ENCOUNTER — Encounter: Payer: Self-pay | Admitting: Nurse Practitioner

## 2023-02-15 DIAGNOSIS — K219 Gastro-esophageal reflux disease without esophagitis: Secondary | ICD-10-CM | POA: Diagnosis not present

## 2023-02-15 DIAGNOSIS — F323 Major depressive disorder, single episode, severe with psychotic features: Secondary | ICD-10-CM

## 2023-02-15 DIAGNOSIS — M159 Polyosteoarthritis, unspecified: Secondary | ICD-10-CM

## 2023-02-15 DIAGNOSIS — I10 Essential (primary) hypertension: Secondary | ICD-10-CM

## 2023-02-15 DIAGNOSIS — F0390 Unspecified dementia without behavioral disturbance: Secondary | ICD-10-CM

## 2023-02-15 DIAGNOSIS — K5909 Other constipation: Secondary | ICD-10-CM | POA: Diagnosis not present

## 2023-02-15 NOTE — Assessment & Plan Note (Signed)
on Tylenol, ambulates with walker 

## 2023-02-15 NOTE — Assessment & Plan Note (Signed)
supportive care in memory care unit [facility] since Flu 08/2021. CT head was negative for acute process. Taking Memantine. MMSE 21/30 10/13/22 

## 2023-02-15 NOTE — Assessment & Plan Note (Signed)
blood pressure is controlled on diuretics. Bun/creat 21/1.0 06/19/22 

## 2023-02-15 NOTE — Assessment & Plan Note (Signed)
stable, on Famotidine, Maalox qd/prn, Hgb 13.3 06/19/22 

## 2023-02-15 NOTE — Assessment & Plan Note (Signed)
on prn Dulcolax po/suppository, prn MOM, MiraLax qd/bid alternative.  

## 2023-02-15 NOTE — Assessment & Plan Note (Signed)
stabilized, resumed Amitriptyline at 50mg qd-failed GDR, off Librium, continued Fluphenazine, on Clonazepam, TSH 1.62 08/15/21 

## 2023-02-15 NOTE — Progress Notes (Signed)
Location:  [facility] Room Number: NO/101/A Place of Service:  SNF (31) Provider:  Chan Sheahan X, NP   [name], MD as PCP - General (Internal Medicine) [name], MD (Ophthalmology) [name], MD (Psychiatry) [name] X, NP as Nurse Practitioner (Nurse Practitioner) [name], MD as Consulting Physician (Orthopedic Surgery)  Extended Emergency Contact Information Primary Emergency Contact: [name], [place] Work Phone: [phone] Relation: Son Secondary Emergency Contact: [name] Home Phone: [phone] Relation: Sister  Code Status:  DNR Goals of care: Advanced Directive information    02/15/2023    2:44 PM  Advanced Directives  Does Patient Have a Medical Advance Directive? Yes  Type of Estate agent of [place];Living will;Out of facility DNR (pink MOST or yellow form)  Does patient want to make changes to medical advance directive? No - Patient declined  Copy of Healthcare Power of Attorney in Chart? Yes - validated most recent copy scanned in chart (See row information)  Pre-existing out of facility DNR order (yellow form or pink MOST form) Pink MOST/Yellow Form most recent copy in chart - Physician notified to receive inpatient order     Chief Complaint  Patient presents with   Medical Management of Chronic Issues    Patient is here for a follow up for chronic conditions    Quality Metric Gaps    Patient is due for an updated covid vaccine    HPI:  Pt is a 82 y.o. female seen today for medical management of chronic diseases.      HTN, blood pressure is controlled on diuretics. Bun/creat 21/1.0 06/19/22             OA on Tylenol, ambulates with walker.              GERD, stable, on Famotidine, Maalox qd/prn, Hgb 13.3 06/19/22             Depression/anxiety/hallucination, stabilized, resumed  Amitriptyline at 50mg  qd-failed GDR, off Librium, continued Fluphenazine, on Clonazepam, TSH 1.62 08/15/21             Dementia, supportive care in memory care unit [facility] since Flu 08/2021. CT head was negative for acute process. Taking Memantine. MMSE 21/30 10/13/22             Chronic constipation, on prn Dulcolax po/suppository, prn MOM, MiraLax qd/bid alternative.              Allergic rhinitis, takes Claritin, Flonase, Mucinex        OP, stopped taking Prolia due to cost, ? Allergic to Fosamax.  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          Past Medical History:  Diagnosis Date   Allergy    Anemia    Anxiety    Asthma    CAD (coronary artery disease)    Depression    Diverticulosis    Dysfunction of eustachian tube    Elevated LFTs    Esophageal reflux    Esophageal stricture 2002   Hemorrhoids    History of rectal polyps    HLD (hyperlipidemia)    HTN (hypertension)    Hypertonicity of bladder    Irritable bowel syndrome with constipation    Obesity    Optic neuritis    Osteoporosis    Peptic ulcer disease  Peripheral neuropathy    Trigeminal neuralgia    prior injury   Visual loss, bilateral    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, 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   Vitamin D deficiency    Past Surgical History:  Procedure Laterality Date   ABDOMINAL HYSTERECTOMY     ANAL RECTAL MANOMETRY N/A 07/05/2014   Procedure: ANO RECTAL MANOMETRY;  Surgeon: Romie Levee, MD;  Location: [facility];  Service: Endoscopy;  Laterality: N/A;   APPENDECTOMY     CAROTID ENDARTERECTOMY     COLONOSCOPY  2008, 2015   TONSILLECTOMY     UPPER GASTROINTESTINAL ENDOSCOPY  2002    Allergies  Allergen Reactions   Biaxin [Clarithromycin]    Clarithromycin    Doxycycline    Fosamax [Alendronate Sodium]    Geodon [Ziprasidone Hydrochloride]    Lipitor [Atorvastatin Calcium]    Penicillins    Pravachol [Pravastatin Sodium]     Statins    Sulfonamide Derivatives    Zithromax [Azithromycin]    Zocor [Simvastatin]     Outpatient Encounter Medications as of 02/15/2023  Medication Sig   acetaminophen (TYLENOL) 500 MG tablet Take 1,000 mg by mouth every 8 (eight) hours as needed for fever, headache or mild pain. Give 500 mg by mouth three times a day related to PAIN, UNSPECIFIED   alum & mag hydroxide-simeth (MAALOX/MYLANTA) 200-200-20 MG/5ML suspension Take 30 mLs by mouth every 4 (four) hours as needed for indigestion or heartburn. Give 30 ml orally at bedtime   amitriptyline (ELAVIL) 50 MG tablet Take 50 mg by mouth daily.   Artificial Tears ophthalmic solution Place 1 drop into both eyes 3 (three) times daily. 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   Benzocaine (ORAJEL MT) Use as directed in the mouth or throat. 20-0.26-0.15 %; amt: SMALL AMOUNT Three Times A Day - PRN;  mucous membrane Special Instructions: Apply to right inner mouth sore TID PRN [DX: Other specified disorders of teeth and supporting structures]  Every 4 Hours - PRN;may self-administer   bisacodyl (DULCOLAX) 10 MG suppository Place 10 mg rectally daily as needed for moderate constipation.   bisacodyl (DULCOLAX) 5 MG EC tablet Take 5 mg by mouth daily as needed for moderate constipation.   carbamide peroxide (DEBROX) 6.5 % OTIC solution 5 drops as needed (impaction). 5 drops to affected ear, otic (ear). At bedtime, PRN, Identify cerumen impaction by direct visualization by use of otoscope. Place 5 drops to affected ear QHS x3 nights then irrigate with bulb syringe. If not effective notify MD.   clonazePAM (KLONOPIN) 0.25 MG disintegrating tablet Take 1 tablet (0.25 mg total) by mouth 2 (two) times daily. Morning and evening   diclofenac Sodium (VOLTAREN) 1 % GEL Apply 2 g topically 2 (two) times daily as needed (pain). apply to lower back area   famotidine (PEPCID) 20 MG  tablet Take 20 mg by mouth at bedtime.   fluPHENAZine (PROLIXIN) 1 MG tablet Take 1 mg by mouth at bedtime.   furosemide (LASIX) 20 MG tablet Take 20 mg by mouth daily.   guaiFENesin (MUCINEX) 600 MG 12 hr tablet Take 600 mg by mouth daily as needed (Cold or cogestion).   hydrocortisone valerate cream (WESTCORT) 0.2 % Apply to rash on face and neck topically every 12 hours as needed for Rash related to Rash and other nonspecific skin eruption   ketoconazole (NIZORAL) 2 % cream Apply 1 application  topically 2 (two) times daily. APPLY TO RASH ON FACE AND NECK TWICE DAILY AS NEEDED   ketoconazole (NIZORAL) 2 % shampoo Apply 1 application. topically 2 (two) times a week. Tuesday and Friday   loratadine (CLARITIN) 10 MG tablet Take 10 mg by mouth daily.   magnesium hydroxide (MILK OF MAGNESIA) 400 MG/5ML suspension Take 30 mLs by mouth daily as needed for mild constipation.   memantine (NAMENDA) 10 MG tablet Take 10 mg by mouth 2 (two) times daily.   Multiple Vitamins-Minerals (SENTRY SENIOR) TABS Take 1 tablet by mouth daily. 500-300-250 mcg [DX: Vitamin deficiency, unspecified]   OYSTER SHELL PO Take 250 mg by mouth in the morning and at bedtime.   Polyethylene Glycol 3350 (MIRALAX PO) Take 17 g by mouth See admin instructions. Take 17 gm, oral twice a day, every other day. Miralax 17 gm + 4-8oz fluid mizture BID   saccharomyces boulardii (FLORASTOR) 250 MG capsule Take 250 mg by mouth daily.   spironolactone (ALDACTONE) 25 MG tablet Take 12.5 mg by mouth daily.   triamcinolone cream (KENALOG) 0.1 % Apply 1 application  topically as needed (rash). Apply BID as needed to rash to chest, behind L ear, left posterior neck.   No facility-administered encounter medications on file as of 02/15/2023.    Review of Systems  Constitutional:  Negative for appetite change, fatigue and fever.  HENT:  Positive for hearing loss. Negative for congestion, rhinorrhea and voice change.   Eyes:  Positive for visual  disturbance.       Low vision  Respiratory:  Negative for cough and shortness of breath.   Cardiovascular:  Negative for leg swelling.  Gastrointestinal:  Negative for abdominal pain and constipation.  Genitourinary:  Negative for dysuria, frequency and urgency.  Musculoskeletal:  Positive for arthralgias and gait problem.  Skin:  Negative for rash.  Neurological:  Negative for speech difficulty, weakness and light-headedness.       Memory lapses  Psychiatric/Behavioral:  Positive for confusion. Negative for behavioral problems, hallucinations and sleep disturbance. The patient is not nervous/anxious.     Immunization History  Administered Date(s) Administered   Influenza Split 07/23/2013   Influenza Whole 07/05/2009, 06/26/2018   Influenza-Unspecified 07/29/2014, 06/14/2015, 07/11/2016, 07/15/2017, 06/27/2019, 07/05/2020, 07/13/2021, 07/21/2022   Moderna SARS-COV2 Booster Vaccination 02/09/2022   Moderna Sars-Covid-2 Vaccination 09/26/2019, 10/24/2019, 08/02/2020   PFIZER(Purple Top)SARS-COV-2 Vaccination 09/26/2019   PPD Test 06/17/2013   Pneumococcal Conjugate-13 07/22/2017   Pneumococcal Polysaccharide-23 05/05/2009   Respiratory Syncytial Virus Vaccine,Recomb Aduvanted(Arexvy) 10/12/2022   Td 05/05/2009, 07/16/2019   Unspecified SARS-COV-2 Vaccination 02/21/2021, 06/13/2021   Zoster Recombinat (Shingrix) 09/27/2021, 12/05/2021   Zoster, Live 01/09/2011   Pertinent  Health Maintenance Due  Topic Date Due   INFLUENZA VACCINE  04/25/2023   DEXA SCAN  Completed      06/08/2022   11:00 PM 06/09/2022   10:00 AM 11/23/2022   11:33 AM 12/17/2022    3:49 PM 02/15/2023    2:45 PM  Fall Risk  Falls in the past year?   1 0 0  Was there an injury with Fall?   1 0 0  Fall Risk Category Calculator   2 0 0  (RETIRED) Patient Fall Risk Level High fall risk High fall risk     Patient at Risk for Falls Due to   History of fall(s);Impaired balance/gait History of fall(s);Impaired  balance/gait History of fall(s);Impaired balance/gait  Fall risk Follow up   Falls evaluation completed Falls evaluation completed Falls evaluation completed  Functional Status Survey:    Vitals:   02/15/23 1445  BP: (!) 128/90  Pulse: 71  Resp: 18  Temp: (!) 97.3 F (36.3 C)  SpO2: 96%  Weight: 125 lb 3.2 oz (56.8 kg)  Height: 5\' 5"  (1.651 m)   Body mass index is 20.83 kg/m. Physical Exam Constitutional:      Appearance: Normal appearance.  HENT:     Head: Normocephalic.     Nose: Nose normal.     Mouth/Throat:     Mouth: Mucous membranes are moist.  Eyes:     Extraocular Movements: Extraocular movements intact.     Conjunctiva/sclera: Conjunctivae normal.     Pupils: Pupils are equal, round, and reactive to light.     Comments: Legally blind  Cardiovascular:     Rate and Rhythm: Normal rate and regular rhythm.     Heart sounds: No murmur heard.    Comments: DP pulses R+L present.  Pulmonary:     Effort: Pulmonary effort is normal.     Breath sounds: No rales.  Abdominal:     General: Bowel sounds are normal.     Palpations: Abdomen is soft.     Tenderness: There is no abdominal tenderness.  Genitourinary:    Comments: From previous examination a pinto bean sized cyst like lump palpated near the clitoris, asymptomatic.  Musculoskeletal:     Cervical back: Normal range of motion and neck supple.     Right lower leg: No edema.     Left lower leg: No edema.  Skin:    General: Skin is warm and dry.     Findings: No rash.  Neurological:     General: No focal deficit present.     Mental Status: She is alert. Mental status is at baseline.     Gait: Gait abnormal.     Comments: Oriented to person, place.   Psychiatric:        Mood and Affect: Mood normal.        Behavior: Behavior normal.        Thought Content: Thought content normal.     Comments: The patient appears at her baseline mentation during my examination.      Labs reviewed: Recent Labs     06/07/22 1646 06/08/22 0032 06/09/22 0504 06/19/22 0000  NA 138 139 141 138  K 5.0 4.8 4.3 4.0  CL 103 104 107 100  CO2  --  25 22 24*  GLUCOSE 97 102* 99  --   BUN 27* 21 14 21   CREATININE 1.00 0.92 0.95 1.0  CALCIUM  --  9.1 9.4 9.4  MG  --  2.3 2.1  --   PHOS  --  3.8 3.3  --    Recent Labs    06/07/22 1641 06/08/22 0032 06/09/22 0504 06/19/22 0000  AST 29 34  --  20  ALT 18 19  --  14  ALKPHOS 100 98  --  123  BILITOT 0.3 0.6  --   --   PROT 7.2 6.7  --   --   ALBUMIN 4.0 3.8 3.5 4.2   Recent Labs    04/26/22 0000 04/26/22 0000 06/07/22 1641 06/07/22 1646 06/08/22 0032 06/09/22 0504 06/19/22 0000  WBC 5.9   < > 6.4  --  9.2 7.4 7.2  NEUTROABS 3,039.00  --  3.1  --   --   --  5,134.00  HGB 13.6  --  15.1*   < > 12.8 13.4  13.3  HCT 40  --  46.3*   < > 39.3 39.7 40  MCV  --   --  96.9  --  95.6 95.4  --   PLT 353  --  303  --  333 304 362   < > = values in this interval not displayed.   Lab Results  Component Value Date   TSH 1.62 08/15/2021   Lab Results  Component Value Date   HGBA1C 5.6 06/08/2022   Lab Results  Component Value Date   CHOL 201 (H) 06/08/2022   HDL 71 06/08/2022   LDLCALC 113 (H) 06/08/2022   LDLDIRECT 190.0 03/24/2012   TRIG 84 06/08/2022   CHOLHDL 2.8 06/08/2022    Significant Diagnostic Results in last 30 days:  No results found.  Assessment/Plan Constipation  on prn Dulcolax po/suppository, prn MOM, MiraLax qd/bid alternative.   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HCC)  supportive care in memory care unit [facility] since Flu 08/2021. CT head was negative for acute process. Taking Memantine. 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   Essential hypertension blood pressure is controlled on diuretics. Bun/creat 21/1.0 06/19/22     Family/ staff Communication: plan of care reviewed with the patient and charge nurse.   Labs/tests ordered:  none  Time spend 35 minutes.

## 2023-03-20 ENCOUNTER — Non-Acute Institutional Stay (SKILLED_NURSING_FACILITY): Payer: Medicare Other | Admitting: Orthopedic Surgery

## 2023-03-20 ENCOUNTER — Encounter: Payer: Self-pay | Admitting: Orthopedic Surgery

## 2023-03-20 DIAGNOSIS — K219 Gastro-esophageal reflux disease without esophagitis: Secondary | ICD-10-CM | POA: Diagnosis not present

## 2023-03-20 DIAGNOSIS — F0390 Unspecified dementia without behavioral disturbance: Secondary | ICD-10-CM

## 2023-03-20 DIAGNOSIS — I10 Essential (primary) hypertension: Secondary | ICD-10-CM

## 2023-03-20 DIAGNOSIS — M810 Age-related osteoporosis without current pathological fracture: Secondary | ICD-10-CM

## 2023-03-20 DIAGNOSIS — F323 Major depressive disorder, single episode, severe with psychotic features: Secondary | ICD-10-CM

## 2023-03-20 DIAGNOSIS — K5909 Other constipation: Secondary | ICD-10-CM

## 2023-03-20 DIAGNOSIS — M159 Polyosteoarthritis, unspecified: Secondary | ICD-10-CM

## 2023-03-20 DIAGNOSIS — D692 Other nonthrombocytopenic purpura: Secondary | ICD-10-CM

## 2023-03-20 NOTE — Progress Notes (Signed)
Location:   [facility] Room Number: 101A Place of Service:  SNF [phone]) Provider:  Hazle Nordmann, NP  [name], MD  [name], MD as PCP - General (Internal Medicine) [name], MD (Ophthalmology) [name], MD (Psychiatry) Mast, Man X, NP as Nurse Practitioner (Nurse Practitioner) [name], MD as Consulting Physician (Orthopedic Surgery)  Extended Emergency Contact Information Primary Emergency Contact: [name], [place] Work Phone: [phone] Relation: Son Secondary Emergency Contact: [name] Home Phone: [phone] Relation: Sister  Code Status:  DNR Goals of care: Advanced Directive information    03/20/2023   10:21 AM  Advanced Directives  Does Patient Have a Medical Advance Directive? Yes  Type of Estate agent of [place];Living will;Out of facility DNR (pink MOST or yellow form)  Does patient want to make changes to medical advance directive? No - Patient declined  Copy of Healthcare Power of Attorney in Chart? Yes - validated most recent copy scanned in chart (See row information)  Pre-existing out of facility DNR order (yellow form or pink MOST form) Pink MOST form placed in chart (order not valid for inpatient use);Yellow form placed in chart (order not valid for inpatient use)     Chief Complaint  Patient presents with   Medical Management of Chronic Issues    Routine follow up   Immunizations    Discuss need for current COVID booster    HPI:  Pt is a 82 y.o. female seen today for medical management of chronic diseases.    She currently resides on the memory care unit at [facility]. PMH: CAD, HTN, PVD, constipation, GERD, IBS, rectocele, optic neuritis, OA, osteoporosis, depression and anxiety.    Senile purpura- nursing reports bruise to left neck> approx 5 x 3 cm, no reported  injury or fall, she denies pain or itching Dementia-  MMSE 21/30 09/2022, no recent behaviors, continues to ambulate on own, remains on Namenda HTN- BUN/creat 21/1.0 05/2022, remains on furosemide and spironolactone GERD- hgb 13.3 05/2022, remains on famotidine and Maalox OA- involves multiple joints, remains on norco and tylenol Depression/anxiety/hallucinations- no mood changes, remains on amitriptyline, Fluphenazine and clonazepam Chronic constipation- remains on bisacodyl po/suppository, MOM and miralax prn Osteoporosis- DEXA 2018 t score - 3.6, off Prolia due to cost, allergic to fosamax, remains on calcium supplement, no future bone density studies per HPOA  Recent weights:  06/01 126.2 lbs  05/01- 125.2 lbs  04/01- 126.2 lbs  Recent blood pressures:  06/24- 132/75.   06/17- 148/90  06/10- 121/52    Past Medical History:  Diagnosis Date   Allergy    Anemia    Anxiety    Asthma    CAD (coronary artery disease)    Depression    Diverticulosis    Dysfunction of eustachian tube    Elevated LFTs    Esophageal reflux    Esophageal stricture 2002   Hemorrhoids    History of rectal polyps    HLD (hyperlipidemia)    HTN (hypertension)    Hypertonicity of bladder    Irritable bowel syndrome with constipation    Obesity    Optic neuritis    Osteoporosis    Peptic ulcer disease    Peripheral neuropathy    Trigeminal neuralgia    prior injury   Visual loss, bilateral    left- retinal artery occulusion  vs  optic neuritis, 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   Vitamin D deficiency    Past Surgical History:  Procedure Laterality Date   ABDOMINAL HYSTERECTOMY     ANAL RECTAL MANOMETRY N/A 07/05/2014   Procedure: ANO RECTAL MANOMETRY;  Surgeon: Romie Levee, MD;  Location: [facility];  Service: Endoscopy;  Laterality: N/A;   APPENDECTOMY     CAROTID ENDARTERECTOMY     COLONOSCOPY  2008, 2015   TONSILLECTOMY     UPPER GASTROINTESTINAL ENDOSCOPY  2002    Allergies  Allergen  Reactions   Biaxin [Clarithromycin]    Clarithromycin    Doxycycline    Fosamax [Alendronate Sodium]    Geodon [Ziprasidone Hydrochloride]    Lipitor [Atorvastatin Calcium]    Penicillins    Pravachol [Pravastatin Sodium]    Statins    Sulfonamide Derivatives    Zithromax [Azithromycin]    Zocor [Simvastatin]     Allergies as of 03/20/2023       Reactions   Biaxin [clarithromycin]    Clarithromycin    Doxycycline    Fosamax [alendronate Sodium]    Geodon [ziprasidone Hydrochloride]    Lipitor [atorvastatin Calcium]    Penicillins    Pravachol [pravastatin Sodium]    Statins    Sulfonamide Derivatives    Zithromax [azithromycin]    Zocor [simvastatin]         Medication List        Accurate as of March 20, 2023 10:23 AM. If you have any questions, ask your nurse or doctor.          STOP taking these medications    ORAJEL MT Stopped by: Octavia Heir, NP       TAKE these medications    acetaminophen 500 MG tablet Commonly known as: TYLENOL Take 1,000 mg by mouth every 8 (eight) hours as needed for fever, headache or mild pain. Give 500 mg by mouth three times a day related to PAIN, UNSPECIFIED   alum & mag hydroxide-simeth 200-200-20 MG/5ML suspension Commonly known as: MAALOX/MYLANTA Take 30 mLs by mouth every 4 (four) hours as needed for indigestion or heartburn. Give 30 ml orally at bedtime   amitriptyline 50 MG tablet Commonly known as: ELAVIL Take 50 mg by mouth daily.   Artificial Tears ophthalmic solution Place 1 drop into both eyes 3 (three) times daily. 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   bisacodyl 10 MG suppository Commonly known as: DULCOLAX Place 10 mg rectally daily as needed for moderate constipation.   bisacodyl 5 MG EC tablet Commonly known as: DULCOLAX Take 5 mg by mouth daily as needed for moderate constipation.   carbamide peroxide 6.5 % OTIC  solution Commonly known as: DEBROX 5 drops as needed (impaction). 5 drops to affected ear, otic (ear). At bedtime, PRN, Identify cerumen impaction by direct visualization by use of otoscope. Place 5 drops to affected ear QHS x3 nights then irrigate with bulb syringe. If not effective notify MD.   clonazePAM 0.25 MG disintegrating tablet Commonly known as: KLONOPIN Take 1 tablet (0.25 mg total) by mouth 2 (two) times daily. Morning and evening   diclofenac Sodium 1 % Gel Commonly known as: VOLTAREN Apply 2 g topically 2 (two) times daily as needed (pain). apply to lower back area   famotidine 20 MG tablet Commonly known as: PEPCID Take 20 mg by mouth at bedtime.   fluPHENAZine 1 MG tablet Commonly known  as: PROLIXIN Take 1 mg by mouth at bedtime.   furosemide 20 MG tablet Commonly known as: LASIX Take 20 mg by mouth daily.   guaiFENesin 600 MG 12 hr tablet Commonly known as: MUCINEX Take 600 mg by mouth daily as needed (Cold or cogestion).   hydrocortisone valerate cream 0.2 % Commonly known as: WESTCORT Apply to rash on face and neck topically every 12 hours as needed for Rash related to Rash and other nonspecific skin eruption   ketoconazole 2 % shampoo Commonly known as: NIZORAL Apply 1 application. topically 2 (two) times a week. Tuesday and Friday   ketoconazole 2 % cream Commonly known as: NIZORAL Apply 1 application  topically 2 (two) times daily. APPLY TO RASH ON FACE AND NECK TWICE DAILY AS NEEDED   loratadine 10 MG tablet Commonly known as: CLARITIN Take 10 mg by mouth daily.   magnesium hydroxide 400 MG/5ML suspension Commonly known as: MILK OF MAGNESIA Take 30 mLs by mouth daily as needed for mild constipation.   memantine 10 MG tablet Commonly known as: NAMENDA Take 10 mg by mouth 2 (two) times daily.   MIRALAX PO Take 17 g by mouth See admin instructions. Take 17 gm, oral twice a day, every other day. Miralax 17 gm + 4-8oz fluid mizture BID   OYSTER  SHELL PO Take 250 mg by mouth in the morning and at bedtime.   saccharomyces boulardii 250 MG capsule Commonly known as: FLORASTOR Take 250 mg by mouth daily.   Sentry Senior Tabs Take 1 tablet by mouth daily. 500-300-250 mcg [DX: Vitamin deficiency, unspecified]   spironolactone 25 MG tablet Commonly known as: ALDACTONE Take 12.5 mg by mouth daily.   triamcinolone cream 0.1 % Commonly known as: KENALOG Apply 1 application  topically as needed (rash). Apply BID as needed to rash to chest, behind L ear, left posterior neck.        Review of Systems  Unable to perform ROS: Dementia    Immunization History  Administered Date(s) Administered   Influenza Split 07/23/2013   Influenza Whole 07/05/2009, 06/26/2018   Influenza-Unspecified 07/29/2014, 06/14/2015, 07/11/2016, 07/15/2017, 06/27/2019, 07/05/2020, 07/13/2021, 07/21/2022   Moderna SARS-COV2 Booster Vaccination 02/09/2022   Moderna Sars-Covid-2 Vaccination 09/26/2019, 10/24/2019, 08/02/2020   PFIZER(Purple Top)SARS-COV-2 Vaccination 09/26/2019   PPD Test 06/17/2013   Pneumococcal Conjugate-13 07/22/2017   Pneumococcal Polysaccharide-23 05/05/2009   Respiratory Syncytial Virus Vaccine,Recomb Aduvanted(Arexvy) 10/12/2022   Td 05/05/2009, 07/16/2019   Unspecified SARS-COV-2 Vaccination 02/21/2021, 06/13/2021   Zoster Recombinat (Shingrix) 09/27/2021, 12/05/2021   Zoster, Live 01/09/2011   Pertinent  Health Maintenance Due  Topic Date Due   INFLUENZA VACCINE  04/25/2023   DEXA SCAN  Completed      06/08/2022   11:00 PM 06/09/2022   10:00 AM 11/23/2022   11:33 AM 12/17/2022    3:49 PM 02/15/2023    2:45 PM  Fall Risk  Falls in the past year?   1 0 0  Was there an injury with Fall?   1 0 0  Fall Risk Category Calculator   2 0 0  (RETIRED) Patient Fall Risk Level High fall risk High fall risk     Patient at Risk for Falls Due to   History of fall(s);Impaired balance/gait History of fall(s);Impaired balance/gait  History of fall(s);Impaired balance/gait  Fall risk Follow up   Falls evaluation completed Falls evaluation completed Falls evaluation completed   Functional Status Survey:    Vitals:   03/20/23 0921  BP: 132/75  Pulse:  66  Resp: 18  Temp: (!) 97.3 F (36.3 C)  SpO2: 96%  Weight: 126 lb 3.2 oz (57.2 kg)  Height: 5\' 5"  (1.651 m)   Body mass index is 21 kg/m. Physical Exam Vitals reviewed.  Constitutional:      General: She is not in acute distress. HENT:     Head: Normocephalic.     Right Ear: There is no impacted cerumen.     Left Ear: There is no impacted cerumen.     Nose: Nose normal.     Mouth/Throat:     Mouth: Mucous membranes are moist.  Eyes:     General:        Right eye: No discharge.        Left eye: No discharge.  Cardiovascular:     Rate and Rhythm: Normal rate and regular rhythm.     Pulses: Normal pulses.     Heart sounds: Normal heart sounds.  Pulmonary:     Effort: Pulmonary effort is normal.     Breath sounds: Normal breath sounds.  Abdominal:     General: Bowel sounds are normal. There is no distension.     Palpations: Abdomen is soft.     Tenderness: There is no abdominal tenderness.  Musculoskeletal:     Cervical back: Neck supple. No rigidity or tenderness.     Right lower leg: No edema.     Left lower leg: No edema.  Skin:    General: Skin is warm.     Capillary Refill: Capillary refill takes less than 2 seconds.     Findings: Bruising present.     Comments: Approx 5 x 3 cm purple, non tender lesion to left neck, no skin breakdown. Smaller non tender purple lesions, vary in size to upper extremities.   Neurological:     General: No focal deficit present.     Mental Status: She is alert. Mental status is at baseline.     Motor: Weakness present.     Gait: Gait abnormal.  Psychiatric:        Mood and Affect: Mood normal.     Comments: Very pleasant, follows commands, alert to self/familiar face     Labs reviewed: Recent Labs     06/07/22 1646 06/08/22 0032 06/09/22 0504 06/19/22 0000  NA 138 139 141 138  K 5.0 4.8 4.3 4.0  CL 103 104 107 100  CO2  --  25 22 24*  GLUCOSE 97 102* 99  --   BUN 27* 21 14 21   CREATININE 1.00 0.92 0.95 1.0  CALCIUM  --  9.1 9.4 9.4  MG  --  2.3 2.1  --   PHOS  --  3.8 3.3  --    Recent Labs    06/07/22 1641 06/08/22 0032 06/09/22 0504 06/19/22 0000  AST 29 34  --  20  ALT 18 19  --  14  ALKPHOS 100 98  --  123  BILITOT 0.3 0.6  --   --   PROT 7.2 6.7  --   --   ALBUMIN 4.0 3.8 3.5 4.2   Recent Labs    04/26/22 0000 04/26/22 0000 06/07/22 1641 06/07/22 1646 06/08/22 0032 06/09/22 0504 06/19/22 0000  WBC 5.9   < > 6.4  --  9.2 7.4 7.2  NEUTROABS 3,039.00  --  3.1  --   --   --  5,134.00  HGB 13.6  --  15.1*   < > 12.8 13.4 13.3  HCT 40  --  46.3*   < > 39.3 39.7 40  MCV  --   --  96.9  --  95.6 95.4  --   PLT 353  --  303  --  333 304 362   < > = values in this interval not displayed.   Lab Results  Component Value Date   TSH 1.62 08/15/2021   Lab Results  Component Value Date   HGBA1C 5.6 06/08/2022   Lab Results  Component Value Date   CHOL 201 (H) 06/08/2022   HDL 71 06/08/2022   LDLCALC 113 (H) 06/08/2022   LDLDIRECT 190.0 03/24/2012   TRIG 84 06/08/2022   CHOLHDL 2.8 06/08/2022    Significant Diagnostic Results in last 30 days:  No results found.  Assessment/Plan 1. Senile purpura (HCC) - approx 5 x 3 cm non tender, purple lesion to left neck - other lesions to upper extremities, vary in size - education given to staff and patient   2. 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HCC) - no behaviors - dependent with some ADLs except feeding/dressing self - still ambulates - weights stable - cont Namenda - cont memory care  3. Essential hypertension - controlled with furosemide and spironolactone  4. Gastroesophageal reflux disease without esophagitis - hgb stable - cont famotidine  5. 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 Depression, psychotic (HCC) - followed by [facility]  - cont amitriptyline, fluphenazine, and clonazepam   7. Constipation - abdomen soft - cont ducolax po/suppository, MOM and miralax  8. Senile osteoporosis - no further studies per goals of care - allergy to Fosamax - cont calcium supplement    Family/ staff Communication: plan discussed with patient and nurse  Labs/tests ordered:  none

## 2023-03-21 ENCOUNTER — Encounter: Payer: Self-pay | Admitting: Nurse Practitioner

## 2023-03-21 ENCOUNTER — Non-Acute Institutional Stay (SKILLED_NURSING_FACILITY): Payer: Medicare Other | Admitting: Nurse Practitioner

## 2023-03-21 DIAGNOSIS — K219 Gastro-esophageal reflux disease without esophagitis: Secondary | ICD-10-CM

## 2023-03-21 DIAGNOSIS — J309 Allergic rhinitis, unspecified: Secondary | ICD-10-CM

## 2023-03-21 DIAGNOSIS — F323 Major depressive disorder, single episode, severe with psychotic features: Secondary | ICD-10-CM

## 2023-03-21 DIAGNOSIS — F0390 Unspecified dementia without behavioral disturbance: Secondary | ICD-10-CM

## 2023-03-21 DIAGNOSIS — I10 Essential (primary) hypertension: Secondary | ICD-10-CM

## 2023-03-21 DIAGNOSIS — T148XXA Other injury of unspecified body region, initial encounter: Secondary | ICD-10-CM | POA: Diagnosis not present

## 2023-03-21 DIAGNOSIS — K5909 Other constipation: Secondary | ICD-10-CM

## 2023-03-21 DIAGNOSIS — M159 Polyosteoarthritis, unspecified: Secondary | ICD-10-CM

## 2023-03-21 NOTE — Progress Notes (Signed)
Location:  [facility] Room Number: NO/101/A Place of Service:  SNF (31) Provider:  Kiira Brach X, NP [name], MD  [name], MD as PCP - General (Internal Medicine) [name], MD (Ophthalmology) [name], MD (Psychiatry) Dell Hurtubise X, NP as Nurse Practitioner (Nurse Practitioner) [name], MD as Consulting Physician (Orthopedic Surgery)  Extended Emergency Contact Information Primary Emergency Contact: [name], [place] Work Phone: [phone] Relation: Son Secondary Emergency Contact: [name] Home Phone: [phone] Relation: Sister  Code Status:  DNR Goals of care: Advanced Directive information    03/20/2023   10:21 AM  Advanced Directives  Does Patient Have a Medical Advance Directive? Yes  Type of Estate agent of [place];Living will;Out of facility DNR (pink MOST or yellow form)  Does patient want to make changes to medical advance directive? No - Patient declined  Copy of Healthcare Power of Attorney in Chart? Yes - validated most recent copy scanned in chart (See row information)  Pre-existing out of facility DNR order (yellow form or pink MOST form) Pink MOST form placed in chart (order not valid for inpatient use);Yellow form placed in chart (order not valid for inpatient use)     Chief Complaint  Patient presents with   Acute Visit    Patient is being seen for neck bruising    HPI:  Pt is a 83 y.o. female seen today for an acute visit for the left neck bruise, the patient stated she has been rolled on her pillow hard the night before, denied pain, injury, no noted redness, swelling or warmth. She takes ASA 81mg  every day.      HTN, blood pressure is controlled on diuretics. Bun/creat 21/1.0 06/19/22             OA on Tylenol, ambulates with walker.              GERD, stable, on Famotidine,  Maalox qd/prn, Hgb 13.3 06/19/22             Depression/anxiety/hallucination, stabilized, resumed Amitriptyline at 50mg  qd-failed GDR, off Librium, continued Fluphenazine, on Clonazepam, TSH 1.62 08/15/21             Dementia, supportive care in memory care unit [facility] since Flu 08/2021. CT head was negative for acute process. Taking Memantine. MMSE 21/30 10/13/22             Chronic constipation, on prn Dulcolax po/suppository, prn MOM, MiraLax qd/bid alternative.              Allergic rhinitis, takes Claritin, Flonase, Mucinex        OP, stopped taking Prolia due to cost, ? Allergic to Fosamax.  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      Past Medical History:  Diagnosis Date   Allergy    Anemia    Anxiety    Asthma    CAD (coronary artery disease)    Depression    Diverticulosis    Dysfunction of eustachian tube    Elevated LFTs    Esophageal reflux    Esophageal stricture 2002   Hemorrhoids    History of rectal polyps    HLD (hyperlipidemia)    HTN (hypertension)    Hypertonicity of bladder    Irritable bowel syndrome with constipation    Obesity  Optic neuritis    Osteoporosis    Peptic ulcer disease    Peripheral neuropathy    Trigeminal neuralgia    prior injury   Visual loss, bilateral    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, 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   Vitamin D deficiency    Past Surgical History:  Procedure Laterality Date   ABDOMINAL HYSTERECTOMY     ANAL RECTAL MANOMETRY N/A 07/05/2014   Procedure: ANO RECTAL MANOMETRY;  Surgeon: Romie Levee, MD;  Location: [facility];  Service: Endoscopy;  Laterality: N/A;   APPENDECTOMY     CAROTID ENDARTERECTOMY     COLONOSCOPY  2008, 2015   TONSILLECTOMY     UPPER GASTROINTESTINAL ENDOSCOPY  2002    Allergies  Allergen Reactions   Biaxin [Clarithromycin]    Clarithromycin    Doxycycline    Fosamax [Alendronate Sodium]    Geodon [Ziprasidone  Hydrochloride]    Lipitor [Atorvastatin Calcium]    Penicillins    Pravachol [Pravastatin Sodium]    Statins    Sulfonamide Derivatives    Zithromax [Azithromycin]    Zocor [Simvastatin]     Outpatient Encounter Medications as of 03/21/2023  Medication Sig   acetaminophen (TYLENOL) 500 MG tablet Take 1,000 mg by mouth every 8 (eight) hours as needed for fever, headache or mild pain. Give 500 mg by mouth three times a day related to PAIN, UNSPECIFIED   alum & mag hydroxide-simeth (MAALOX/MYLANTA) 200-200-20 MG/5ML suspension Take 30 mLs by mouth every 4 (four) hours as needed for indigestion or heartburn. Give 30 ml orally at bedtime   amitriptyline (ELAVIL) 50 MG tablet Take 50 mg by mouth daily.   Artificial Tears ophthalmic solution Place 1 drop into both eyes 3 (three) times daily. 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   bisacodyl (DULCOLAX) 10 MG suppository Place 10 mg rectally daily as needed for moderate constipation.   bisacodyl (DULCOLAX) 5 MG EC tablet Take 5 mg by mouth daily as needed for moderate constipation.   carbamide peroxide (DEBROX) 6.5 % OTIC solution 5 drops as needed (impaction). 5 drops to affected ear, otic (ear). At bedtime, PRN, Identify cerumen impaction by direct visualization by use of otoscope. Place 5 drops to affected ear QHS x3 nights then irrigate with bulb syringe. If not effective notify MD.   clonazePAM (KLONOPIN) 0.25 MG disintegrating tablet Take 1 tablet (0.25 mg total) by mouth 2 (two) times daily. Morning and evening   diclofenac Sodium (VOLTAREN) 1 % GEL Apply 2 g topically 2 (two) times daily as needed (pain). apply to lower back area   famotidine (PEPCID) 20 MG tablet Take 20 mg by mouth at bedtime.   fluPHENAZine (PROLIXIN) 1 MG tablet Take 1 mg by mouth at bedtime.   furosemide (LASIX) 20 MG tablet Take 20 mg by mouth daily.   guaiFENesin (MUCINEX) 600 MG 12 hr tablet  Take 600 mg by mouth daily as needed (Cold or cogestion).   hydrocortisone valerate cream (WESTCORT) 0.2 % Apply to rash on face and neck topically every 12 hours as needed for Rash related to Rash and other nonspecific skin eruption   ketoconazole (NIZORAL) 2 % cream Apply 1 application  topically 2 (two) times daily. APPLY TO RASH ON FACE AND NECK TWICE DAILY AS NEEDED   ketoconazole (NIZORAL) 2 % shampoo Apply 1 application. topically 2 (two) times a week. Tuesday and Friday  loratadine (CLARITIN) 10 MG tablet Take 10 mg by mouth daily.   magnesium hydroxide (MILK OF MAGNESIA) 400 MG/5ML suspension Take 30 mLs by mouth daily as needed for mild constipation.   memantine (NAMENDA) 10 MG tablet Take 10 mg by mouth 2 (two) times daily.   Multiple Vitamins-Minerals (SENTRY SENIOR) TABS Take 1 tablet by mouth daily. 500-300-250 mcg [DX: Vitamin deficiency, unspecified]   OYSTER SHELL PO Take 250 mg by mouth in the morning and at bedtime.   Polyethylene Glycol 3350 (MIRALAX PO) Take 17 g by mouth See admin instructions. Take 17 gm, oral twice a day, every other day. Miralax 17 gm + 4-8oz fluid mizture BID   saccharomyces boulardii (FLORASTOR) 250 MG capsule Take 250 mg by mouth daily.   spironolactone (ALDACTONE) 25 MG tablet Take 12.5 mg by mouth daily.   triamcinolone cream (KENALOG) 0.1 % Apply 1 application  topically as needed (rash). Apply BID as needed to rash to chest, behind L ear, left posterior neck.   No facility-administered encounter medications on file as of 03/21/2023.    Review of Systems  Constitutional:  Negative for appetite change, fatigue and fever.  HENT:  Positive for hearing loss. Negative for congestion, rhinorrhea and voice change.   Eyes:  Positive for visual disturbance.       Low vision  Respiratory:  Negative for cough and shortness of breath.   Cardiovascular:  Negative for leg swelling.  Gastrointestinal:  Negative for abdominal pain and constipation.   Genitourinary:  Negative for dysuria, frequency and urgency.  Musculoskeletal:  Positive for arthralgias and gait problem.  Skin:        Left neck bruise.   Neurological:  Negative for speech difficulty, weakness and light-headedness.       Memory lapses  Psychiatric/Behavioral:  Positive for confusion. Negative for behavioral problems, hallucinations and sleep disturbance. The patient is not nervous/anxious.     Immunization History  Administered Date(s) Administered   Influenza Split 07/23/2013   Influenza Whole 07/05/2009, 06/26/2018   Influenza-Unspecified 07/29/2014, 06/14/2015, 07/11/2016, 07/15/2017, 06/27/2019, 07/05/2020, 07/13/2021, 07/21/2022   Moderna SARS-COV2 Booster Vaccination 02/09/2022   Moderna Sars-Covid-2 Vaccination 09/26/2019, 10/24/2019, 08/02/2020   PFIZER(Purple Top)SARS-COV-2 Vaccination 09/26/2019   PPD Test 06/17/2013   Pneumococcal Conjugate-13 07/22/2017   Pneumococcal Polysaccharide-23 05/05/2009   Respiratory Syncytial Virus Vaccine,Recomb Aduvanted(Arexvy) 10/12/2022   Td 05/05/2009, 07/16/2019   Unspecified SARS-COV-2 Vaccination 02/21/2021, 06/13/2021   Zoster Recombinant(Shingrix) 09/27/2021, 12/05/2021   Zoster, Live 01/09/2011   Pertinent  Health Maintenance Due  Topic Date Due   INFLUENZA VACCINE  04/25/2023   DEXA SCAN  Completed      06/09/2022   10:00 AM 11/23/2022   11:33 AM 12/17/2022    3:49 PM 02/15/2023    2:45 PM 03/20/2023   12:00 PM  Fall Risk  Falls in the past year?  1 0 0 0  Was there an injury with Fall?  1 0 0 0  Fall Risk Category Calculator  2 0 0 0  (RETIRED) Patient Fall Risk Level High fall risk      Patient at Risk for Falls Due to  History of fall(s);Impaired balance/gait History of fall(s);Impaired balance/gait History of fall(s);Impaired balance/gait History of fall(s)  Fall risk Follow up  Falls evaluation completed Falls evaluation completed Falls evaluation completed Falls evaluation completed;Education  provided;Falls prevention discussed   Functional Status Survey:    Vitals:   03/21/23 0923  BP: 132/75  Pulse: 66  Resp: 18  Temp: (!) 97.3  F (36.3 C)  SpO2: 96%  Weight: 126 lb 3.2 oz (57.2 kg)  Height: 5\' 5"  (1.651 m)   Body mass index is 21 kg/m. Physical Exam Constitutional:      Appearance: Normal appearance.  HENT:     Head: Normocephalic.     Nose: Nose normal.     Mouth/Throat:     Mouth: Mucous membranes are moist.  Eyes:     Extraocular Movements: Extraocular movements intact.     Conjunctiva/sclera: Conjunctivae normal.     Pupils: Pupils are equal, round, and reactive to light.     Comments: Legally blind  Cardiovascular:     Rate and Rhythm: Normal rate and regular rhythm.     Heart sounds: No murmur heard.    Comments: DP pulses R+L present.  Pulmonary:     Effort: Pulmonary effort is normal.     Breath sounds: No rales.  Abdominal:     General: Bowel sounds are normal.     Palpations: Abdomen is soft.     Tenderness: There is no abdominal tenderness.  Genitourinary:    Comments: From previous examination a pinto bean sized cyst like lump palpated near the clitoris, asymptomatic.  Musculoskeletal:     Cervical back: Normal range of motion and neck supple.     Right lower leg: No edema.     Left lower leg: No edema.  Skin:    General: Skin is warm and dry.     Findings: Bruising present.     Comments: Left neck dark bruise/ecchymosis about her palm size, no warmth, skin breakdown, swelling, or tenderness.   Neurological:     General: No focal deficit present.     Mental Status: She is alert. Mental status is at baseline.     Gait: Gait abnormal.     Comments: Oriented to person, place.   Psychiatric:        Mood and Affect: Mood normal.        Behavior: Behavior normal.        Thought Content: Thought content normal.     Comments: The patient appears at her baseline mentation during my examination.      Labs reviewed: Recent Labs     06/07/22 1646 06/08/22 0032 06/09/22 0504 06/19/22 0000  NA 138 139 141 138  K 5.0 4.8 4.3 4.0  CL 103 104 107 100  CO2  --  25 22 24*  GLUCOSE 97 102* 99  --   BUN 27* 21 14 21   CREATININE 1.00 0.92 0.95 1.0  CALCIUM  --  9.1 9.4 9.4  MG  --  2.3 2.1  --   PHOS  --  3.8 3.3  --    Recent Labs    06/07/22 1641 06/08/22 0032 06/09/22 0504 06/19/22 0000  AST 29 34  --  20  ALT 18 19  --  14  ALKPHOS 100 98  --  123  BILITOT 0.3 0.6  --   --   PROT 7.2 6.7  --   --   ALBUMIN 4.0 3.8 3.5 4.2   Recent Labs    04/26/22 0000 04/26/22 0000 06/07/22 1641 06/07/22 1646 06/08/22 0032 06/09/22 0504 06/19/22 0000  WBC 5.9   < > 6.4  --  9.2 7.4 7.2  NEUTROABS 3,039.00  --  3.1  --   --   --  5,134.00  HGB 13.6  --  15.1*   < > 12.8 13.4 13.3  HCT 40  --  46.3*   < > 39.3 39.7 40  MCV  --   --  96.9  --  95.6 95.4  --   PLT 353  --  303  --  333 304 362   < > = values in this interval not displayed.   Lab Results  Component Value Date   TSH 1.62 08/15/2021   Lab Results  Component Value Date   HGBA1C 5.6 06/08/2022   Lab Results  Component Value Date   CHOL 201 (H) 06/08/2022   HDL 71 06/08/2022   LDLCALC 113 (H) 06/08/2022   LDLDIRECT 190.0 03/24/2012   TRIG 84 06/08/2022   CHOLHDL 2.8 06/08/2022    Significant Diagnostic Results in last 30 days:  No results found.  Assessment/Plan Essential hypertension blood pressure is controlled on diuretics. Bun/creat 21/1.0 06/19/22  Bruise  the left neck bruise, the patient stated she has been rolled on her pillow hard the night before, denied pain, injury, no noted redness, swelling or warmth. She takes ASA 81mg  every day. Observe.   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   GERD (gastroesophageal reflux disease)  stable, on Famotidine, Maalox qd/prn, Hgb 13.3 06/19/22  Depression, psychotic (HCC)  stabilized, resumed Amitriptyline at 50mg  qd-failed  GDR, off Librium, continued Fluphenazine, on Clonazepam, TSH 1.62 08/15/21  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HCC)  supportive care in memory care unit [facility] since Flu 08/2021. CT head was negative for acute process. Taking Memantine. MMSE 21/30 10/13/22  Constipation Stable,  on prn Dulcolax po/suppository, prn MOM, MiraLax qd/bid alternative.      Family/ staff Communication: plan of care reviewed with the patient and charge nurse.   Labs/tests ordered:  none  Time spend 35 minutes.

## 2023-03-25 ENCOUNTER — Encounter: Payer: Self-pay | Admitting: Nurse Practitioner

## 2023-03-25 DIAGNOSIS — T148XXA Other injury of unspecified body region, initial encounter: Secondary | ICD-10-CM | POA: Insufficient documentation

## 2023-03-25 NOTE — Assessment & Plan Note (Addendum)
the left neck bruise about he patient's palm size, the patient stated she has been rolled on her pillow hard the night before, denied pain, injury, no noted redness, swelling or warmth. She takes ASA 81mg  every day. Observe.

## 2023-03-25 NOTE — Assessment & Plan Note (Signed)
supportive care in memory care unit [facility] since Flu 08/2021. CT head was negative for acute process. Taking Memantine. MMSE 21/30 10/13/22 

## 2023-03-25 NOTE — Assessment & Plan Note (Signed)
on Tylenol, ambulates with walker 

## 2023-03-25 NOTE — Assessment & Plan Note (Signed)
takes Claritin, Flonase, Mucinex        

## 2023-03-25 NOTE — Assessment & Plan Note (Signed)
blood pressure is controlled on diuretics. Bun/creat 21/1.0 06/19/22 

## 2023-03-25 NOTE — Assessment & Plan Note (Signed)
stable, on Famotidine, Maalox qd/prn, Hgb 13.3 06/19/22 

## 2023-03-25 NOTE — Assessment & Plan Note (Signed)
Stable,  on prn Dulcolax po/suppository, prn MOM, MiraLax qd/bid alternative.  

## 2023-03-25 NOTE — Assessment & Plan Note (Signed)
stabilized, resumed Amitriptyline at 50mg qd-failed GDR, off Librium, continued Fluphenazine, on Clonazepam, TSH 1.62 08/15/21 

## 2023-05-03 ENCOUNTER — Encounter: Payer: Self-pay | Admitting: Family Medicine

## 2023-05-06 ENCOUNTER — Encounter: Payer: Self-pay | Admitting: Sports Medicine

## 2023-05-06 ENCOUNTER — Encounter: Payer: Self-pay | Admitting: Family Medicine

## 2023-05-06 ENCOUNTER — Non-Acute Institutional Stay (SKILLED_NURSING_FACILITY): Payer: Medicare Other | Admitting: Sports Medicine

## 2023-05-06 DIAGNOSIS — K5909 Other constipation: Secondary | ICD-10-CM | POA: Diagnosis not present

## 2023-05-06 DIAGNOSIS — F0390 Unspecified dementia without behavioral disturbance: Secondary | ICD-10-CM | POA: Diagnosis not present

## 2023-05-06 DIAGNOSIS — F323 Major depressive disorder, single episode, severe with psychotic features: Secondary | ICD-10-CM

## 2023-05-06 DIAGNOSIS — I10 Essential (primary) hypertension: Secondary | ICD-10-CM | POA: Diagnosis not present

## 2023-05-06 NOTE — Progress Notes (Signed)
Location:  [facility] Room Number: NO/101/A Place of Service:  SNF (31) Provider:  Venita Sheffield, MD    [name], MD as PCP - General (Internal Medicine) [name], MD (Ophthalmology) [name], MD (Psychiatry) Mast, Man X, NP as Nurse Practitioner (Nurse Practitioner) [name], MD as Consulting Physician (Orthopedic Surgery)  Extended Emergency Contact Information Primary Emergency Contact: [name], [place] Work Phone: [phone] Relation: Son Secondary Emergency Contact: [name] Home Phone: [phone] Relation: Sister  Code Status:  DNR Goals of care: Advanced Directive information    05/06/2023   11:35 AM  Advanced Directives  Does Patient Have a Medical Advance Directive? Yes  Type of Estate agent of [place];Living will;Out of facility DNR (pink MOST or yellow form)  Does patient want to make changes to medical advance directive? No - Patient declined  Copy of Healthcare Power of Attorney in Chart? Yes - validated most recent copy scanned in chart (See row information)  Pre-existing out of facility DNR order (yellow form or pink MOST form) Pink MOST form placed in chart (order not valid for inpatient use);Yellow form placed in chart (order not valid for inpatient use)     Chief Complaint  Patient presents with   Medical Management of Chronic Issues    Routine visit   Quality Metric Gaps    Covid and flu vaccine.    HPI:  Pt is a 82 y.o. female seen today for medical management of chronic diseases.   Pt seen and examined in her room, she is sleeping in her recliner chair. She opens her eyes on calling her name, seems and pleasant. Says she is doing well and has no concerns. Reports me that she has trouble with her vision but no recent change. She is able to ambulate independently, can make her  bed and feeds on her own. Denies trouble with swallowing.  No recent falls States that she is staying in the [facility] for about a year and prior to that stayed in Assisted living. Says that she enjoys sitting outside during summer times and enjoys playing Bingo.  As per Nurse no recent falls, No behavioral changes, sleeps fine Weight stable. As per [facility] NP family did not want to change her meds as she was agitated when tried to wean her meds which required hospitalization.    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, 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   Vitamin D deficiency    Past Surgical History:  Procedure Laterality Date   ABDOMINAL HYSTERECTOMY     ANAL RECTAL MANOMETRY N/A  07/05/2014   Procedure: ANO RECTAL MANOMETRY;  Surgeon: Romie Levee, MD;  Location: [facility];  Service: Endoscopy;  Laterality: N/A;   APPENDECTOMY     CAROTID ENDARTERECTOMY     COLONOSCOPY  2008, 2015   TONSILLECTOMY     UPPER GASTROINTESTINAL ENDOSCOPY  2002    Allergies  Allergen Reactions   Biaxin [Clarithromycin]    Clarithromycin    Doxycycline    Fosamax [Alendronate Sodium]    Geodon [Ziprasidone Hydrochloride]    Lipitor [Atorvastatin Calcium]    Penicillins    Pravachol [Pravastatin Sodium]    Statins    Sulfonamide Derivatives    Zithromax [Azithromycin]    Zocor [Simvastatin]     Outpatient  Encounter Medications as of 05/06/2023  Medication Sig   acetaminophen (TYLENOL) 500 MG tablet Take 1,000 mg by mouth every 8 (eight) hours as needed for fever, headache or mild pain. Give 500 mg by mouth three times a day related to PAIN, UNSPECIFIED   alum & mag hydroxide-simeth (MAALOX/MYLANTA) 200-200-20 MG/5ML suspension Take 30 mLs by mouth every 4 (four) hours as needed for indigestion or heartburn. Give 30 ml orally at bedtime   amitriptyline (ELAVIL) 50 MG tablet Take 50 mg by mouth daily.   Artificial Tears ophthalmic solution Place 1 drop into both eyes 3 (three) times daily. 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   bisacodyl (DULCOLAX) 10 MG suppository Place 10 mg rectally daily as needed for moderate constipation.   bisacodyl (DULCOLAX) 5 MG EC tablet Take 5 mg by mouth daily as needed for moderate constipation.   carbamide peroxide (DEBROX) 6.5 % OTIC solution 5 drops as needed (impaction). 5 drops to affected ear, otic (ear). At bedtime, PRN, Identify cerumen impaction by direct visualization by use of otoscope. Place 5 drops to affected ear QHS x3 nights then irrigate with bulb syringe. If not effective notify MD.   clonazePAM (KLONOPIN) 0.25 MG disintegrating tablet Take 1 tablet (0.25 mg total) by mouth 2 (two) times daily. Morning and evening   diclofenac Sodium (VOLTAREN) 1 % GEL Apply 2 g topically 2 (two) times daily as needed (pain). apply to lower back area   famotidine (PEPCID) 20 MG tablet Take 20 mg by mouth at bedtime.   fluPHENAZine (PROLIXIN) 1 MG tablet Take 1 mg by mouth at bedtime.   furosemide (LASIX) 20 MG tablet Take 20 mg by mouth daily.   guaiFENesin (MUCINEX) 600 MG 12 hr tablet Take 600 mg by mouth daily as needed (Cold or cogestion).   hydrocortisone valerate cream (WESTCORT) 0.2 % Apply to rash on face and neck topically every 12 hours as needed for Rash related to Rash and other  nonspecific skin eruption   ketoconazole (NIZORAL) 2 % cream Apply 1 application  topically 2 (two) times daily. APPLY TO RASH ON FACE AND NECK TWICE DAILY AS NEEDED   ketoconazole (NIZORAL) 2 % shampoo Apply 1 application. topically 2 (two) times a week. Tuesday and Friday   loratadine (CLARITIN) 10 MG tablet Take 10 mg by mouth daily.   magnesium hydroxide (MILK OF MAGNESIA) 400 MG/5ML suspension Take 30 mLs by mouth daily as needed for mild constipation.   memantine (NAMENDA) 10 MG tablet Take 10 mg by mouth 2 (two) times daily.   Multiple Vitamins-Minerals (SENTRY SENIOR) TABS Take 1 tablet by mouth daily. 500-300-250 mcg [DX: Vitamin deficiency, unspecified]   OYSTER SHELL PO Take  250 mg by mouth in the morning and at bedtime.   Polyethylene Glycol 3350 (MIRALAX PO) Take 17 g by mouth See admin instructions. Take 17 gm, oral twice a day, every other day. Miralax 17 gm + 4-8oz fluid mizture BID   saccharomyces boulardii (FLORASTOR) 250 MG capsule Take 250 mg by mouth daily.   spironolactone (ALDACTONE) 25 MG tablet Take 12.5 mg by mouth daily.   triamcinolone cream (KENALOG) 0.1 % Apply 1 application  topically as needed (rash). Apply BID as needed to rash to chest, behind L ear, left posterior neck.   No facility-administered encounter medications on file as of 05/06/2023.    Review of Systems  Constitutional:  Negative for activity change.  HENT:  Negative for congestion and trouble swallowing.   Eyes:  Negative for discharge and redness.  Respiratory:  Negative for cough.   Cardiovascular:  Negative for chest pain and leg swelling.  Gastrointestinal:  Positive for constipation. Negative for abdominal pain.  Genitourinary:  Negative for dysuria and flank pain.  Musculoskeletal:  Negative for arthralgias.    Immunization History  Administered Date(s) Administered   Influenza Split 07/23/2013   Influenza Whole 07/05/2009, 06/26/2018   Influenza-Unspecified 07/29/2014, 06/14/2015,  07/11/2016, 07/15/2017, 06/27/2019, 07/05/2020, 07/13/2021, 07/21/2022   Moderna SARS-COV2 Booster Vaccination 02/09/2022   Moderna Sars-Covid-2 Vaccination 09/26/2019, 10/24/2019, 08/02/2020   PFIZER(Purple Top)SARS-COV-2 Vaccination 09/26/2019   PPD Test 06/17/2013   Pneumococcal Conjugate-13 07/22/2017   Pneumococcal Polysaccharide-23 05/05/2009   Respiratory Syncytial Virus Vaccine,Recomb Aduvanted(Arexvy) 10/12/2022   Td 05/05/2009, 07/16/2019   Unspecified SARS-COV-2 Vaccination 02/21/2021, 06/13/2021   Zoster Recombinant(Shingrix) 09/27/2021, 12/05/2021   Zoster, Live 01/09/2011   Pertinent  Health Maintenance Due  Topic Date Due   INFLUENZA VACCINE  04/25/2023   DEXA SCAN  Completed      06/09/2022   10:00 AM 11/23/2022   11:33 AM 12/17/2022    3:49 PM 02/15/2023    2:45 PM 03/20/2023   12:00 PM  Fall Risk  Falls in the past year?  1 0 0 0  Was there an injury with Fall?  1 0 0 0  Fall Risk Category Calculator  2 0 0 0  (RETIRED) Patient Fall Risk Level High fall risk      Patient at Risk for Falls Due to  History of fall(s);Impaired balance/gait History of fall(s);Impaired balance/gait History of fall(s);Impaired balance/gait History of fall(s)  Fall risk Follow up  Falls evaluation completed Falls evaluation completed Falls evaluation completed Falls evaluation completed;Education provided;Falls prevention discussed   Functional Status Survey:    Vitals:   05/06/23 1131  BP: 124/82  Pulse: 71  Resp: 18  Temp: (!) 97.3 F (36.3 C)  SpO2: 96%  Weight: 127 lb 12.8 oz (58 kg)  Height: 5\' 5"  (1.651 m)   Body mass index is 21.27 kg/m. Physical Exam Constitutional:      Appearance: Normal appearance.  HENT:     Head: Normocephalic and atraumatic.  Cardiovascular:     Rate and Rhythm: Normal rate and regular rhythm.     Pulses: Normal pulses.     Heart sounds: Normal heart sounds.  Pulmonary:     Effort: No respiratory distress.     Breath sounds: No wheezing.   Abdominal:     General: Bowel sounds are normal. There is no distension.     Palpations: There is no mass.     Tenderness: There is no abdominal tenderness.  Skin:    Findings: Bruising present.  Neurological:  Mental Status: She is alert. Mental status is at baseline.     Sensory: No sensory deficit.     Motor: No weakness.     Labs reviewed: Recent Labs    06/07/22 1646 06/08/22 0032 06/09/22 0504 06/19/22 0000  NA 138 139 141 138  K 5.0 4.8 4.3 4.0  CL 103 104 107 100  CO2  --  25 22 24*  GLUCOSE 97 102* 99  --   BUN 27* 21 14 21   CREATININE 1.00 0.92 0.95 1.0  CALCIUM  --  9.1 9.4 9.4  MG  --  2.3 2.1  --   PHOS  --  3.8 3.3  --    Recent Labs    06/07/22 1641 06/08/22 0032 06/09/22 0504 06/19/22 0000  AST 29 34  --  20  ALT 18 19  --  14  ALKPHOS 100 98  --  123  BILITOT 0.3 0.6  --   --   PROT 7.2 6.7  --   --   ALBUMIN 4.0 3.8 3.5 4.2   Recent Labs    06/07/22 1641 06/07/22 1646 06/08/22 0032 06/09/22 0504 06/19/22 0000  WBC 6.4  --  9.2 7.4 7.2  NEUTROABS 3.1  --   --   --  5,134.00  HGB 15.1*   < > 12.8 13.4 13.3  HCT 46.3*   < > 39.3 39.7 40  MCV 96.9  --  95.6 95.4  --   PLT 303  --  333 304 362   < > = values in this interval not displayed.   Lab Results  Component Value Date   TSH 1.62 08/15/2021   Lab Results  Component Value Date   HGBA1C 5.6 06/08/2022   Lab Results  Component Value Date   CHOL 201 (H) 06/08/2022   HDL 71 06/08/2022   LDLCALC 113 (H) 06/08/2022   LDLDIRECT 190.0 03/24/2012   TRIG 84 06/08/2022   CHOLHDL 2.8 06/08/2022    Significant Diagnostic Results in last 30 days:  No results found.  Assessment/Plan  Major Neurocognitive disorder No major behavioral manifestations Partly dependent on her ADLS  Ambulates independently  Cont with namenda  Increase physical activity and cognitively engaging activities  Constipation  Pt c/o passing hard stools and moving her bowels every other day Will  change miralax to daily  Will start senna at bedtie Increase oral hydration and physical activity   Depression  Doing well  No concerns as per nursing staff Pt seems to be pleasant  Cont with amitriptyline, klonopin, fluphenazine  HTN  Bp at goal Cont with the same      Family/ staff Communication:  Will check labs cbc, bmp

## 2023-05-09 LAB — BASIC METABOLIC PANEL
BUN: 26 — AB (ref 4–21)
CO2: 28 — AB (ref 13–22)
Chloride: 103 (ref 99–108)
Creatinine: 1.1 (ref 0.5–1.1)
Glucose: 89
Potassium: 4.3 meq/L (ref 3.5–5.1)
Sodium: 139 (ref 137–147)

## 2023-05-09 LAB — CBC AND DIFFERENTIAL
HCT: 34 — AB (ref 36–46)
Hemoglobin: 11.4 — AB (ref 12.0–16.0)
Platelets: 280 10*3/uL (ref 150–400)
WBC: 5.6

## 2023-05-09 LAB — COMPREHENSIVE METABOLIC PANEL
Calcium: 8.9 (ref 8.7–10.7)
eGFR: 48

## 2023-05-09 LAB — CBC: RBC: 3.61 — AB (ref 3.87–5.11)

## 2023-05-31 ENCOUNTER — Encounter: Payer: Self-pay | Admitting: Nurse Practitioner

## 2023-05-31 ENCOUNTER — Non-Acute Institutional Stay (SKILLED_NURSING_FACILITY): Payer: Medicare Other | Admitting: Nurse Practitioner

## 2023-05-31 DIAGNOSIS — K5909 Other constipation: Secondary | ICD-10-CM | POA: Diagnosis not present

## 2023-05-31 DIAGNOSIS — M159 Polyosteoarthritis, unspecified: Secondary | ICD-10-CM

## 2023-05-31 DIAGNOSIS — M8000XA Age-related osteoporosis with current pathological fracture, unspecified site, initial encounter for fracture: Secondary | ICD-10-CM

## 2023-05-31 DIAGNOSIS — F323 Major depressive disorder, single episode, severe with psychotic features: Secondary | ICD-10-CM

## 2023-05-31 DIAGNOSIS — F0390 Unspecified dementia without behavioral disturbance: Secondary | ICD-10-CM | POA: Diagnosis not present

## 2023-05-31 DIAGNOSIS — J309 Allergic rhinitis, unspecified: Secondary | ICD-10-CM | POA: Diagnosis not present

## 2023-05-31 DIAGNOSIS — K219 Gastro-esophageal reflux disease without esophagitis: Secondary | ICD-10-CM

## 2023-05-31 DIAGNOSIS — L309 Dermatitis, unspecified: Secondary | ICD-10-CM

## 2023-05-31 DIAGNOSIS — I10 Essential (primary) hypertension: Secondary | ICD-10-CM

## 2023-05-31 NOTE — Assessment & Plan Note (Signed)
stable, on Famotidine, Maalox qd/prn, Hgb 11.4 05/09/23

## 2023-05-31 NOTE — Assessment & Plan Note (Signed)
stopped taking Prolia due to cost, ? Allergic to Fosamax.  her son declined DEXA 

## 2023-05-31 NOTE — Assessment & Plan Note (Addendum)
stabilized, resumed Amitriptyline at 50mg  qd-failed GDR, off Librium, continued Fluphenazine, on Clonazepam, TSH 1.62 08/15/21 05/30/23 pharm recommendation reduce Elavil to 25mg  every day if HPOA consents .

## 2023-05-31 NOTE — Assessment & Plan Note (Signed)
Pain is controlled on Tylenol, ambulates with walker.

## 2023-05-31 NOTE — Assessment & Plan Note (Signed)
blood pressure is controlled on diuretics. Bun/creat 26/1.1 05/09/23

## 2023-05-31 NOTE — Progress Notes (Addendum)
Location:  [facility] Room Number: 101-A Place of Service:  SNF (31) Provider:  Janice Seales X, NP    [name], MD as PCP - General (Internal Medicine) [name], MD (Ophthalmology) [name], MD (Psychiatry) Raynell Scott X, NP as Nurse Practitioner (Nurse Practitioner) [name], MD as Consulting Physician (Orthopedic Surgery)  Extended Emergency Contact Information Primary Emergency Contact: [name], [place] Work Phone: [phone] Relation: Son Secondary Emergency Contact: [name] Home Phone: [phone] Relation: Sister  Code Status:  DNR Goals of care: Advanced Directive information    05/31/2023   11:15 AM  Advanced Directives  Does Patient Have a Medical Advance Directive? Yes  Type of Estate agent of [place];Living will;Out of facility DNR (pink MOST or yellow form)  Does patient want to make changes to medical advance directive? No - Patient declined  Copy of Healthcare Power of Attorney in Chart? Yes - validated most recent copy scanned in chart (See row information)  Pre-existing out of facility DNR order (yellow form or pink MOST form) Pink MOST form placed in chart (order not valid for inpatient use);Yellow form placed in chart (order not valid for inpatient use)     Chief Complaint  Patient presents with  . Medical Management of Chronic Issues    Routine Visit  . Immunizations    Influenza and Covid vaccine.    HPI:  Pt is a 82 y.o. female seen today for medical management of chronic diseases.     HTN, blood pressure is controlled on diuretics. Bun/creat 26/1.1 05/09/23             OA on Tylenol, ambulates with walker.              GERD, stable, on Famotidine, Maalox qd/prn, Hgb 11.4 05/09/23             Depression/anxiety/hallucination, stabilized, resumed Amitriptyline at 50mg  qd-failed GDR, off  Librium, continued Fluphenazine, on Clonazepam, TSH 1.62 08/15/21             Dementia, supportive care in memory care unit [facility] since Flu 08/2021. CT head was negative for acute process. Taking Memantine. MMSE 21/30 10/13/22             Chronic constipation, on prn Dulcolax po/suppository, prn MOM, MiraLax qd/bid alternative.              Allergic rhinitis, takes Claritin, Flonase, Mucinex        OP, stopped taking Prolia due to cost, ? Allergic to Fosamax.  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        Past Medical History:  Diagnosis Date  . Allergy   . Anemia   . Anxiety   . Asthma   . CAD (coronary artery disease)   . Depression   . Diverticulosis   . Dysfunction of eustachian tube   . Elevated LFTs   . Esophageal reflux   . Esophageal stricture 2002  . Hemorrhoids   . History of rectal polyps   . HLD (hyperlipidemia)   . HTN (hypertension)   . Hypertonicity of bladder   . Irritable bowel syndrome with constipation   . Obesity   . Optic neuritis   . Osteoporosis   . Peptic ulcer disease   . Peripheral neuropathy   . Trigeminal neuralgia  prior injury  . 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 Vitamin D deficiency    Past Surgical History:  Procedure Laterality Date  . ABDOMINAL HYSTERECTOMY    . ANAL RECTAL MANOMETRY N/A 07/05/2014   Procedure: ANO RECTAL MANOMETRY;  Surgeon: Romie Levee, MD;  Location: [facility];  Service: Endoscopy;  Laterality: N/A;  . APPENDECTOMY    . CAROTID ENDARTERECTOMY    . COLONOSCOPY  2008, 2015  . TONSILLECTOMY    . UPPER GASTROINTESTINAL ENDOSCOPY  2002    Allergies  Allergen Reactions  . Biaxin [Clarithromycin]   . Clarithromycin   . Doxycycline   . Fosamax [Alendronate Sodium]   . Geodon [Ziprasidone Hydrochloride]   . Lipitor [Atorvastatin Calcium]   . Penicillins   . Pravachol [Pravastatin Sodium]   .  Statins   . Sulfonamide Derivatives   . Zithromax [Azithromycin]   . Zocor [Simvastatin]     Outpatient Encounter Medications as of 05/31/2023  Medication Sig  . acetaminophen (TYLENOL) 500 MG tablet Take 1,000 mg by mouth every 8 (eight) hours as needed for fever, headache or mild pain. Give 500 mg by mouth three times a day related to PAIN, UNSPECIFIED  . alum & mag hydroxide-simeth (GERI-LANTA) 200-200-20 MG/5ML suspension Take 30 mLs by mouth at bedtime.  Marland Kitchen alum & mag hydroxide-simeth (MAALOX/MYLANTA) 200-200-20 MG/5ML suspension Take 30 mLs by mouth every 4 (four) hours as needed for indigestion or heartburn. Give 30 ml orally at bedtime  . amitriptyline (ELAVIL) 50 MG tablet Take 50 mg by mouth daily.  . Artificial Tears ophthalmic solution Place 1 drop into both eyes 3 (three) times daily. related to Dry eye syndrome of unspecified lacrimal gland  . aspirin 81 MG chewable tablet Chew 81 mg by mouth daily.  . bisacodyl (DULCOLAX) 10 MG suppository Place 10 mg rectally daily as needed for moderate constipation.  . bisacodyl (DULCOLAX) 5 MG EC tablet Take 5 mg by mouth daily as needed for moderate constipation.  . carbamide peroxide (DEBROX) 6.5 % OTIC solution 5 drops as needed (impaction). 5 drops to affected ear, otic (ear). At bedtime, PRN, Identify cerumen impaction by direct visualization by use of otoscope. Place 5 drops to affected ear QHS x3 nights then irrigate with bulb syringe. If not effective notify MD.  . clonazePAM (KLONOPIN) 0.25 MG disintegrating tablet Take 1 tablet (0.25 mg total) by mouth 2 (two) times daily. Morning and evening  . diclofenac Sodium (VOLTAREN) 1 % GEL Apply 2 g topically 2 (two) times daily as needed (pain). apply to lower back area  . fluPHENAZine (PROLIXIN) 1 MG tablet Take 1 mg by mouth at bedtime.  . furosemide (LASIX) 20 MG tablet Take 20 mg by mouth daily.  Marland Kitchen guaiFENesin (MUCINEX) 600 MG 12 hr tablet Take 600 mg by mouth daily as needed (Cold or  cogestion).  . hydrocortisone valerate cream (WESTCORT) 0.2 % Apply to rash on face and neck topically every 12 hours as needed for Rash related to Rash and other nonspecific skin eruption  . ketoconazole (NIZORAL) 2 % cream Apply 1 application  topically 2 (two) times daily. APPLY TO RASH ON FACE AND NECK TWICE DAILY AS NEEDED  . ketoconazole (NIZORAL) 2 % shampoo Apply 1 application. topically 2 (two) times a week. Tuesday and Friday  . loratadine (CLARITIN) 10 MG tablet Take 10 mg by mouth daily.  . magnesium hydroxide (MILK OF MAGNESIA) 400 MG/5ML suspension Take  30 mLs by mouth daily as needed for mild constipation.  . memantine (NAMENDA) 10 MG tablet Take 10 mg by mouth 2 (two) times daily.  . Multiple Vitamins-Minerals (SENTRY SENIOR) TABS Take 1 tablet by mouth daily. 500-300-250 mcg [DX: Vitamin deficiency, unspecified]  . OYSTER SHELL PO Take 250 mg by mouth in the morning and at bedtime.  . Polyethylene Glycol 3350 (MIRALAX PO) Take 17 g by mouth See admin instructions. Take 17 gm, oral twice a day, every other day. Miralax 17 gm + 4-8oz fluid mizture BID  . SENNOSIDES PO Take 1 tablet by mouth at bedtime.  Marland Kitchen spironolactone (ALDACTONE) 25 MG tablet Take 12.5 mg by mouth daily.  Marland Kitchen triamcinolone cream (KENALOG) 0.1 % Apply 1 application  topically as needed (rash). Apply BID as needed to rash to chest, behind L ear, left posterior neck.  . [DISCONTINUED] famotidine (PEPCID) 20 MG tablet Take 30 mg by mouth at bedtime.  . [DISCONTINUED] saccharomyces boulardii (FLORASTOR) 250 MG capsule Take 250 mg by mouth daily.   No facility-administered encounter medications on file as of 05/31/2023.    Review of Systems  Constitutional:  Negative for appetite change, fatigue and fever.  HENT:  Positive for hearing loss. Negative for congestion, rhinorrhea and voice change.   Eyes:  Positive for visual disturbance.       Low vision  Respiratory:  Negative for cough and shortness of breath.    Cardiovascular:  Negative for leg swelling.  Gastrointestinal:  Negative for abdominal pain and constipation.  Genitourinary:  Negative for dysuria, frequency and urgency.  Musculoskeletal:  Positive for arthralgias and gait problem.  Skin:  Positive for rash.  Neurological:  Negative for speech difficulty, weakness and light-headedness.       Memory lapses  Psychiatric/Behavioral:  Positive for confusion. Negative for behavioral problems, hallucinations and sleep disturbance. The patient is not nervous/anxious.     Immunization History  Administered Date(s) Administered  . Influenza Split 07/23/2013  . Influenza Whole 07/05/2009, 06/26/2018  . Influenza-Unspecified 07/29/2014, 06/14/2015, 07/11/2016, 07/15/2017, 06/27/2019, 07/05/2020, 07/13/2021, 07/21/2022  . Moderna SARS-COV2 Booster Vaccination 02/09/2022  . Moderna Sars-Covid-2 Vaccination 09/26/2019, 10/24/2019, 08/02/2020  . PFIZER(Purple Top)SARS-COV-2 Vaccination 09/26/2019  . PPD Test 06/17/2013  . Pneumococcal Conjugate-13 07/22/2017  . Pneumococcal Polysaccharide-23 05/05/2009  . Respiratory Syncytial Virus Vaccine,Recomb Aduvanted(Arexvy) 10/12/2022  . Td 05/05/2009, 07/16/2019  . Unspecified SARS-COV-2 Vaccination 02/21/2021, 06/13/2021  . Zoster Recombinant(Shingrix) 09/27/2021, 12/05/2021  . Zoster, Live 01/09/2011   Pertinent  Health Maintenance Due  Topic Date Due  . INFLUENZA VACCINE  04/25/2023  . DEXA SCAN  Completed      06/09/2022   10:00 AM 11/23/2022   11:33 AM 12/17/2022    3:49 PM 02/15/2023    2:45 PM 03/20/2023   12:00 PM  Fall Risk  Falls in the past year?  1 0 0 0  Was there an injury with Fall?  1 0 0 0  Fall Risk Category Calculator  2 0 0 0  (RETIRED) Patient Fall Risk Level High fall risk      Patient at Risk for Falls Due to  History of fall(s);Impaired balance/gait History of fall(s);Impaired balance/gait History of fall(s);Impaired balance/gait History of fall(s)  Fall risk Follow up   Falls evaluation completed Falls evaluation completed Falls evaluation completed Falls evaluation completed;Education provided;Falls prevention discussed   Functional Status Survey:    Vitals:   05/31/23 1042 06/03/23 1347  BP: (!) 148/94 136/78  Pulse: 71   Resp: 16  Temp: 98.1 F (36.7 C)   SpO2: 94%   Weight: 127 lb 12.8 oz (58 kg)   Height: 5\' 5"  (1.651 m)    Body mass index is 21.27 kg/m. Physical Exam Constitutional:      Appearance: Normal appearance.  HENT:     Head: Normocephalic.     Nose: Nose normal.     Mouth/Throat:     Mouth: Mucous membranes are moist.  Eyes:     Extraocular Movements: Extraocular movements intact.     Conjunctiva/sclera: Conjunctivae normal.     Pupils: Pupils are equal, round, and reactive to light.     Comments: Legally blind  Cardiovascular:     Rate and Rhythm: Normal rate and regular rhythm.     Heart sounds: No murmur heard.    Comments: DP pulses R+L present.  Pulmonary:     Effort: Pulmonary effort is normal.     Breath sounds: No rales.  Abdominal:     General: Bowel sounds are normal.     Palpations: Abdomen is soft.     Tenderness: There is no abdominal tenderness.  Genitourinary:    Comments: From previous examination a pinto bean sized cyst like lump palpated near the clitoris, asymptomatic.  Musculoskeletal:     Cervical back: Normal range of motion and neck supple.     Right lower leg: No edema.     Left lower leg: No edema.  Skin:    General: Skin is warm and dry.     Findings: Rash present. No bruising.     Comments: Right cheek and neck red spots, scattered, mild itching.   Neurological:     General: No focal deficit present.     Mental Status: She is alert. Mental status is at baseline.     Gait: Gait abnormal.     Comments: Oriented to person, place.   Psychiatric:        Mood and Affect: Mood normal.        Behavior: Behavior normal.        Thought Content: Thought content normal.     Comments: The  patient appears at her baseline mentation during my examination.     Labs reviewed: Recent Labs    06/19/22 0000 05/09/23 0000  NA 138 139  K 4.0 4.3  CL 100 103  CO2 24* 28*  BUN 21 26*  CREATININE 1.0 1.1  CALCIUM 9.4 8.9   Recent Labs    06/19/22 0000  AST 20  ALT 14  ALKPHOS 123  ALBUMIN 4.2   Recent Labs    06/19/22 0000 05/09/23 0000  WBC 7.2 5.6  NEUTROABS 5,134.00  --   HGB 13.3 11.4*  HCT 40 34*  PLT 362 280   Lab Results  Component Value Date   TSH 1.62 08/15/2021   Lab Results  Component Value Date   HGBA1C 5.6 06/08/2022   Lab Results  Component Value Date   CHOL 201 (H) 06/08/2022   HDL 71 06/08/2022   LDLCALC 113 (H) 06/08/2022   LDLDIRECT 190.0 03/24/2012   TRIG 84 06/08/2022   CHOLHDL 2.8 06/08/2022    Significant Diagnostic Results in last 30 days:  No results found.  Assessment/Plan 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HCC) supportive care in memory care unit [facility] since Flu 08/2021. CT head was negative for acute process. Taking Memantine. MMSE 21/30 10/13/22  Constipation Stable, on prn Dulcolax po/suppository, prn MOM, MiraLax qd/bid alternative.   Allergic rhinitis takes Claritin, Flonase, Mucinex  Osteoporosis stopped taking Prolia due to cost, ? Allergic to Fosamax.  her son declined DEXA  Dermatitis  mostly in cheeks, intermittent, follows dermatology. Scattered rash right cheek and neck today.   Depression, psychotic (HCC) stabilized, resumed Amitriptyline at 50mg  qd-failed GDR, off Librium, continued Fluphenazine, on Clonazepam, TSH 1.62 08/15/21 05/30/23 pharm recommendation reduce Elavil to 25mg  every day if HPOA consents .  GERD (gastroesophageal reflux disease) stable, on Famotidine, Maalox qd/prn, Hgb 11.4 05/09/23  Essential hypertension blood pressure is controlled on diuretics. 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       Family/ staff Communication: plan of care reviewed with the patient and charge nurse.   Labs/tests ordered:  none  Time spend 35 minutes.

## 2023-05-31 NOTE — Assessment & Plan Note (Signed)
Stable, on prn Dulcolax po/suppository, prn MOM, MiraLax qd/bid alternative.  

## 2023-05-31 NOTE — Assessment & Plan Note (Signed)
takes Claritin, Flonase, Mucinex       

## 2023-05-31 NOTE — Assessment & Plan Note (Signed)
supportive care in memory care unit [facility] since Flu 08/2021. CT head was negative for acute process. Taking Memantine. MMSE 21/30 10/13/22 

## 2023-05-31 NOTE — Assessment & Plan Note (Signed)
mostly in cheeks, intermittent, follows dermatology. Scattered rash right cheek and neck today.

## 2023-06-03 ENCOUNTER — Encounter: Payer: Self-pay | Admitting: Nurse Practitioner

## 2023-06-20 ENCOUNTER — Non-Acute Institutional Stay (SKILLED_NURSING_FACILITY): Payer: Medicare Other | Admitting: Nurse Practitioner

## 2023-06-20 ENCOUNTER — Encounter: Payer: Self-pay | Admitting: Nurse Practitioner

## 2023-06-20 DIAGNOSIS — F0390 Unspecified dementia without behavioral disturbance: Secondary | ICD-10-CM

## 2023-06-20 DIAGNOSIS — M159 Polyosteoarthritis, unspecified: Secondary | ICD-10-CM

## 2023-06-20 DIAGNOSIS — Z66 Do not resuscitate: Secondary | ICD-10-CM | POA: Diagnosis not present

## 2023-06-20 DIAGNOSIS — K219 Gastro-esophageal reflux disease without esophagitis: Secondary | ICD-10-CM

## 2023-06-20 DIAGNOSIS — I10 Essential (primary) hypertension: Secondary | ICD-10-CM

## 2023-06-20 DIAGNOSIS — K5909 Other constipation: Secondary | ICD-10-CM

## 2023-06-20 DIAGNOSIS — F323 Major depressive disorder, single episode, severe with psychotic features: Secondary | ICD-10-CM

## 2023-06-20 NOTE — Assessment & Plan Note (Signed)
stable, on Famotidine, Maalox qd/prn, Hgb 11.4 05/09/23

## 2023-06-20 NOTE — Assessment & Plan Note (Signed)
stabilized, resumed Amitriptyline at 50mg  qd-failed GDR, off Librium, continued Fluphenazine, on Clonazepam, TSH 1.62 08/15/21

## 2023-06-20 NOTE — Assessment & Plan Note (Signed)
Stable,  on prn Dulcolax po/suppository, prn MOM, MiraLax qd/bid alternative.

## 2023-06-20 NOTE — Assessment & Plan Note (Signed)
supportive care in memory care unit [facility] since Flu 08/2021. CT head was negative for acute process. Taking Memantine. MMSE 21/30 10/13/22

## 2023-06-20 NOTE — Progress Notes (Signed)
Location:  [facility] Room Number: N101-A Place of Service:  SNF [phone]) Provider:  Chipper Oman, NP   [name], MD as PCP - General (Internal Medicine) [name], MD (Ophthalmology) [name], MD (Psychiatry) Johnluke Haugen X, NP as Nurse Practitioner (Nurse Practitioner) [name], MD as Consulting Physician (Orthopedic Surgery)  Extended Emergency Contact Information Primary Emergency Contact: [name], [place] Work Phone: [phone] Relation: Son Secondary Emergency Contact: [name] Home Phone: [phone] Relation: Sister  Code Status:  DNR Goals of care: Advanced Directive information    06/20/2023   10:19 AM  Advanced Directives  Does Patient Have a Medical Advance Directive? Yes  Type of Estate agent of [place];Living will;Out of facility DNR (pink MOST or yellow form)  Does patient want to make changes to medical advance directive? No - Patient declined  Copy of Healthcare Power of Attorney in Chart? Yes - validated most recent copy scanned in chart (See row information)  Pre-existing out of facility DNR order (yellow form or pink MOST form) Pink MOST form placed in chart (order not valid for inpatient use);Yellow form placed in chart (order not valid for inpatient use)     Chief Complaint  Patient presents with  . Medical Management of Chronic Issues    Routine visit. Discuss need for flu vaccine and covid booster     HPI:  Pt is a 82 y.o. female seen today for medical management of chronic diseases.    HTN, blood pressure is controlled on diuretics. Bun/creat 26/1.1 05/09/23             OA on Tylenol, ambulates with walker.              GERD, stable, on Famotidine, Maalox qd/prn, Hgb 11.4 05/09/23             Depression/anxiety/hallucination, stabilized, resumed Amitriptyline at 50mg  qd-failed GDR, off  Librium, continued Fluphenazine, on Clonazepam, TSH 1.62 08/15/21             Dementia, supportive care in memory care unit [facility] since Flu 08/2021. CT head was negative for acute process. Taking Memantine. MMSE 21/30 10/13/22             Chronic constipation, on prn Dulcolax po/suppository, prn MOM, MiraLax qd/bid alternative.              Allergic rhinitis, takes Claritin, Flonase, Mucinex        OP, stopped taking Prolia due to cost, ? Allergic to Fosamax.  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          Past Medical History:  Diagnosis Date  . Allergy   . Anemia   . Anxiety   . Asthma   . CAD (coronary artery disease)   . Depression   . Diverticulosis   . Dysfunction of eustachian tube   . Elevated LFTs   . Esophageal reflux   . Esophageal stricture 2002  . Hemorrhoids   . History of rectal polyps   . HLD (hyperlipidemia)   . HTN (hypertension)   . Hypertonicity of bladder   . Irritable bowel syndrome with constipation   . Obesity   . Optic neuritis   . Osteoporosis   . Peptic ulcer disease   . Peripheral neuropathy   . Trigeminal neuralgia    prior injury  .  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 Vitamin D deficiency    Past Surgical History:  Procedure Laterality Date  . ABDOMINAL HYSTERECTOMY    . ANAL RECTAL MANOMETRY N/A 07/05/2014   Procedure: ANO RECTAL MANOMETRY;  Surgeon: Romie Levee, MD;  Location: [facility];  Service: Endoscopy;  Laterality: N/A;  . APPENDECTOMY    . CAROTID ENDARTERECTOMY    . COLONOSCOPY  2008, 2015  . TONSILLECTOMY    . UPPER GASTROINTESTINAL ENDOSCOPY  2002    Allergies  Allergen Reactions  . Biaxin [Clarithromycin]   . Clarithromycin   . Doxycycline   . Fosamax [Alendronate Sodium]   . Geodon [Ziprasidone Hydrochloride]   . Lipitor [Atorvastatin Calcium]   . Penicillins   . Pravachol [Pravastatin Sodium]   .  Statins   . Sulfonamide Derivatives   . Zithromax [Azithromycin]   . Zocor [Simvastatin]     Outpatient Encounter Medications as of 06/20/2023  Medication Sig  . acetaminophen (TYLENOL) 500 MG tablet Take 1,000 mg by mouth every 8 (eight) hours as needed for fever, headache or mild pain. Give 500 mg by mouth three times a day related to PAIN, UNSPECIFIED  . alum & mag hydroxide-simeth (GERI-LANTA) 200-200-20 MG/5ML suspension Take 30 mLs by mouth at bedtime. And every 4 hours as needed  . amitriptyline (ELAVIL) 50 MG tablet Take 50 mg by mouth daily.  . Artificial Tears ophthalmic solution Place 1 drop into both eyes 3 (three) times daily. related to Dry eye syndrome of unspecified lacrimal gland  . aspirin 81 MG chewable tablet Chew 81 mg by mouth daily.  . bisacodyl (DULCOLAX) 10 MG suppository Place 10 mg rectally daily as needed for moderate constipation.  . bisacodyl (DULCOLAX) 5 MG EC tablet Take 5 mg by mouth daily as needed for moderate constipation.  . carbamide peroxide (DEBROX) 6.5 % OTIC solution 5 drops as needed (impaction). 5 drops to affected ear, otic (ear). At bedtime, PRN, Identify cerumen impaction by direct visualization by use of otoscope. Place 5 drops to affected ear QHS x3 nights then irrigate with bulb syringe. If not effective notify MD.  . clonazePAM (KLONOPIN) 0.25 MG disintegrating tablet Take 1 tablet (0.25 mg total) by mouth 2 (two) times daily. Morning and evening  . diclofenac Sodium (VOLTAREN) 1 % GEL Apply 2 g topically 2 (two) times daily as needed (pain). apply to lower back area  . fluPHENAZine (PROLIXIN) 1 MG tablet Take 1 mg by mouth at bedtime.  . furosemide (LASIX) 20 MG tablet Take 20 mg by mouth daily.  Marland Kitchen guaiFENesin (MUCINEX) 600 MG 12 hr tablet Take 600 mg by mouth daily as needed (Cold or cogestion).  . hydrocortisone valerate cream (WESTCORT) 0.2 % Apply to rash on face and neck topically every 12 hours as needed for Rash related to Rash and other  nonspecific skin eruption  . ketoconazole (NIZORAL) 2 % cream Apply 1 application  topically 2 (two) times daily. APPLY TO RASH ON FACE AND NECK TWICE DAILY AS NEEDED  . ketoconazole (NIZORAL) 2 % shampoo Apply 1 application. topically 2 (two) times a week. Tuesday and Friday  . loratadine (CLARITIN) 10 MG tablet Take 10 mg by mouth daily.  . magnesium hydroxide (MILK OF MAGNESIA) 400 MG/5ML suspension Take 30 mLs by mouth daily as needed for mild constipation.  . memantine (NAMENDA) 10 MG tablet Take 10 mg by mouth 2 (two) times daily.  . Multiple  Vitamins-Minerals (SENTRY SENIOR) TABS Take 1 tablet by mouth daily. 500-300-250 mcg [DX: Vitamin deficiency, unspecified]  . OYSTER SHELL PO Take 250 mg by mouth in the morning and at bedtime.  . Polyethylene Glycol 3350 (MIRALAX PO) Take 17 g by mouth See admin instructions. Take 17 gm, oral twice a day, every other day. Miralax 17 gm + 4-8oz fluid mizture BID  . SENNOSIDES PO Take 1 tablet by mouth at bedtime.  Marland Kitchen spironolactone (ALDACTONE) 25 MG tablet Take 12.5 mg by mouth daily.  Marland Kitchen triamcinolone cream (KENALOG) 0.1 % Apply 1 application  topically as needed (rash). Apply BID as needed to rash to chest, behind L ear, left posterior neck.  . [DISCONTINUED] alum & mag hydroxide-simeth (MAALOX/MYLANTA) 200-200-20 MG/5ML suspension Take 30 mLs by mouth every 4 (four) hours as needed for indigestion or heartburn. Give 30 ml orally at bedtime (Patient not taking: Reported on 06/20/2023)   No facility-administered encounter medications on file as of 06/20/2023.    Review of Systems  Constitutional:  Negative for appetite change, fatigue and fever.  HENT:  Positive for hearing loss. Negative for congestion, rhinorrhea and voice change.   Eyes:  Positive for visual disturbance.       Low vision  Respiratory:  Negative for cough and shortness of breath.   Cardiovascular:  Negative for leg swelling.  Gastrointestinal:  Negative for abdominal pain and  constipation.  Genitourinary:  Negative for dysuria, frequency and urgency.  Musculoskeletal:  Positive for arthralgias and gait problem.  Skin:  Positive for rash.  Neurological:  Negative for speech difficulty, weakness and light-headedness.       Memory lapses  Psychiatric/Behavioral:  Positive for confusion. Negative for behavioral problems, hallucinations and sleep disturbance. The patient is not nervous/anxious.     Immunization History  Administered Date(s) Administered  . Influenza Split 07/23/2013  . Influenza Whole 07/05/2009, 06/26/2018  . Influenza-Unspecified 07/29/2014, 06/14/2015, 07/11/2016, 07/15/2017, 06/27/2019, 07/05/2020, 07/13/2021, 07/21/2022  . Moderna SARS-COV2 Booster Vaccination 02/09/2022  . Moderna Sars-Covid-2 Vaccination 09/26/2019, 10/24/2019, 08/02/2020  . PFIZER(Purple Top)SARS-COV-2 Vaccination 09/26/2019  . PPD Test 06/17/2013  . Pneumococcal Conjugate-13 07/22/2017  . Pneumococcal Polysaccharide-23 05/05/2009  . Respiratory Syncytial Virus Vaccine,Recomb Aduvanted(Arexvy) 10/12/2022  . Td 05/05/2009, 07/16/2019  . Unspecified SARS-COV-2 Vaccination 02/21/2021, 06/13/2021  . Zoster Recombinant(Shingrix) 09/27/2021, 12/05/2021  . Zoster, Live 01/09/2011   Pertinent  Health Maintenance Due  Topic Date Due  . INFLUENZA VACCINE  04/25/2023  . DEXA SCAN  Completed      06/09/2022   10:00 AM 11/23/2022   11:33 AM 12/17/2022    3:49 PM 02/15/2023    2:45 PM 03/20/2023   12:00 PM  Fall Risk  Falls in the past year?  1 0 0 0  Was there an injury with Fall?  1 0 0 0  Fall Risk Category Calculator  2 0 0 0  (RETIRED) Patient Fall Risk Level High fall risk      Patient at Risk for Falls Due to  History of fall(s);Impaired balance/gait History of fall(s);Impaired balance/gait History of fall(s);Impaired balance/gait History of fall(s)  Fall risk Follow up  Falls evaluation completed Falls evaluation completed Falls evaluation completed Falls evaluation  completed;Education provided;Falls prevention discussed   Functional Status Survey:    Vitals:   06/20/23 0959 06/20/23 1609  BP: (!) 148/82 118/80  Pulse: 75   SpO2: 97%   Weight: 132 lb (59.9 kg)   Height: 5\' 5"  (1.651 m)    Body mass index is 21.97  kg/m. Physical Exam Constitutional:      Appearance: Normal appearance.  HENT:     Head: Normocephalic.     Nose: Nose normal.     Mouth/Throat:     Mouth: Mucous membranes are moist.  Eyes:     Extraocular Movements: Extraocular movements intact.     Conjunctiva/sclera: Conjunctivae normal.     Pupils: Pupils are equal, round, and reactive to light.     Comments: Legally blind  Cardiovascular:     Rate and Rhythm: Normal rate and regular rhythm.     Heart sounds: No murmur heard.    Comments: DP pulses R+L present.  Pulmonary:     Effort: Pulmonary effort is normal.     Breath sounds: No rales.  Abdominal:     General: Bowel sounds are normal.     Palpations: Abdomen is soft.     Tenderness: There is no abdominal tenderness.  Genitourinary:    Comments: From previous examination a pinto bean sized cyst like lump palpated near the clitoris, asymptomatic.  Musculoskeletal:     Cervical back: Normal range of motion and neck supple.     Right lower leg: No edema.     Left lower leg: No edema.  Skin:    General: Skin is warm and dry.     Findings: Rash present. No bruising.     Comments: On and off facial and neck rash-better today  Neurological:     General: No focal deficit present.     Mental Status: She is alert. Mental status is at baseline.     Gait: Gait abnormal.     Comments: Oriented to person, place.   Psychiatric:        Mood and Affect: Mood normal.        Behavior: Behavior normal.        Thought Content: Thought content normal.     Comments: The patient appears at her baseline mentation during my examination.     Labs reviewed: Recent Labs    05/09/23 0000  NA 139  K 4.3  CL 103  CO2 28*   BUN 26*  CREATININE 1.1  CALCIUM 8.9   No results for input(s): "AST", "ALT", "ALKPHOS", "BILITOT", "PROT", "ALBUMIN" in the last 8760 hours. Recent Labs    05/09/23 0000  WBC 5.6  HGB 11.4*  HCT 34*  PLT 280   Lab Results  Component Value Date   TSH 1.62 08/15/2021   Lab Results  Component Value Date   HGBA1C 5.6 06/08/2022   Lab Results  Component Value Date   CHOL 201 (H) 06/08/2022   HDL 71 06/08/2022   LDLCALC 113 (H) 06/08/2022   LDLDIRECT 190.0 03/24/2012   TRIG 84 06/08/2022   CHOLHDL 2.8 06/08/2022    Significant Diagnostic Results in last 30 days:  No results found.  Assessment/Plan Essential hypertension blood pressure is controlled on diuretics. 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   GERD (gastroesophageal reflux disease) stable, on Famotidine, Maalox qd/prn, Hgb 11.4 05/09/23  Depression, psychotic (HCC) stabilized, resumed Amitriptyline at 50mg  qd-failed GDR, off Librium, continued Fluphenazine, on Clonazepam, TSH 1.62 08/15/21  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HCC)  supportive care in memory care unit [facility] since Flu 08/2021. CT head was negative for acute process. Taking Memantine. MMSE 21/30 10/13/22  Constipation Stable, on prn Dulcolax po/suppository, prn MOM, MiraLax qd/bid alternative.     Family/ staff Communication: plan of care  reviewed with the patient and charge nurse.   Labs/tests ordered: none  Time spend 35 minutes.

## 2023-06-20 NOTE — Assessment & Plan Note (Signed)
on Tylenol, ambulates with walker

## 2023-06-20 NOTE — Assessment & Plan Note (Signed)
blood pressure is controlled on diuretics. Bun/creat 26/1.1 05/09/23

## 2023-07-08 ENCOUNTER — Other Ambulatory Visit: Payer: Self-pay | Admitting: Orthopedic Surgery

## 2023-07-08 DIAGNOSIS — F0390 Unspecified dementia without behavioral disturbance: Secondary | ICD-10-CM

## 2023-07-08 MED ORDER — CLONAZEPAM 0.25 MG PO TBDP
0.2500 mg | ORAL_TABLET | Freq: Two times a day (BID) | ORAL | 0 refills | Status: DC
Start: 2023-07-08 — End: 2023-09-04

## 2023-08-09 ENCOUNTER — Non-Acute Institutional Stay (SKILLED_NURSING_FACILITY): Payer: Medicare Other | Admitting: Sports Medicine

## 2023-08-09 ENCOUNTER — Encounter: Payer: Self-pay | Admitting: Sports Medicine

## 2023-08-09 DIAGNOSIS — F0390 Unspecified dementia without behavioral disturbance: Secondary | ICD-10-CM

## 2023-08-09 DIAGNOSIS — K5909 Other constipation: Secondary | ICD-10-CM

## 2023-08-09 DIAGNOSIS — I10 Essential (primary) hypertension: Secondary | ICD-10-CM

## 2023-08-09 DIAGNOSIS — K219 Gastro-esophageal reflux disease without esophagitis: Secondary | ICD-10-CM | POA: Diagnosis not present

## 2023-08-09 DIAGNOSIS — M159 Polyosteoarthritis, unspecified: Secondary | ICD-10-CM

## 2023-08-09 NOTE — Progress Notes (Unsigned)
Location:  [facility] Room Number: N101-A Place of Service:  SNF (31) Provider:  Venita Sheffield, MD    [name], MD as PCP - General (Internal Medicine) [name], MD (Ophthalmology) [name], MD (Psychiatry) Mast, Man X, NP as Nurse Practitioner (Nurse Practitioner) [name], MD as Consulting Physician (Orthopedic Surgery)  Extended Emergency Contact Information Primary Emergency Contact: [name], [place] Work Phone: [phone] Relation: Son Secondary Emergency Contact: [name] Home Phone: [phone] Relation: Sister  Code Status:  DNR Goals of care: Advanced Directive information    08/09/2023    3:56 PM  Advanced Directives  Does Patient Have a Medical Advance Directive? Yes  Type of Estate agent of [place];Living will;Out of facility DNR (pink MOST or yellow form)  Does patient want to make changes to medical advance directive? No - Patient declined  Copy of Healthcare Power of Attorney in Chart? Yes - validated most recent copy scanned in chart (See row information)  Pre-existing out of facility DNR order (yellow form or pink MOST form) Pink MOST form placed in chart (order not valid for inpatient use);Yellow form placed in chart (order not valid for inpatient use)     Chief Complaint  Patient presents with   Follow-up    Routine Visit    HPI:  Pt is a 82 y.o. female seen today for medical management of chronic diseases.  Pt seen and examined in the living room She seems pleasant and answers questions appropriately, follows commands Able to ambulate independently       Major Neurocognitive disorder with behavioral manifestations Needs assistance with her ADLS  No agitation reported by nursing staff. Pt did not tolerate GDR in the past as per previous documentation  She is currently  on fluphenazine, clonazepam  HTN  On spironolactone   GERD  Denies heart burn , acid reflux  On milk of magnesia  Chronic constipation Denies abdominal pain  No concerns of bleeding as per nursing staff   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, 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   Vitamin D deficiency    Past Surgical History:  Procedure Laterality Date   ABDOMINAL HYSTERECTOMY     ANAL RECTAL MANOMETRY N/A 07/05/2014   Procedure: ANO RECTAL MANOMETRY;  Surgeon: Romie Levee, MD;  Location: [facility];  Service: Endoscopy;  Laterality: N/A;   APPENDECTOMY     CAROTID ENDARTERECTOMY     COLONOSCOPY  2008, 2015   TONSILLECTOMY     UPPER GASTROINTESTINAL ENDOSCOPY  2002    Allergies  Allergen Reactions   Biaxin [  Clarithromycin]    Clarithromycin    Doxycycline    Fosamax [Alendronate Sodium]    Geodon [Ziprasidone Hydrochloride]    Lipitor [Atorvastatin Calcium]    Penicillins    Pravachol [Pravastatin Sodium]    Statins    Sulfonamide Derivatives    Zithromax [Azithromycin]    Zocor [Simvastatin]     Outpatient Encounter Medications as of 08/09/2023  Medication Sig   acetaminophen (TYLENOL) 500 MG tablet Take 1,000 mg by mouth every 8 (eight) hours as needed for fever, headache or mild pain. Give 500 mg by mouth three times a day related to PAIN, UNSPECIFIED   alum & mag hydroxide-simeth  (GERI-LANTA) 200-200-20 MG/5ML suspension Take 30 mLs by mouth at bedtime. And every 4 hours as needed   amitriptyline (ELAVIL) 50 MG tablet Take 50 mg by mouth daily.   Artificial Tears ophthalmic solution Place 1 drop into both eyes 3 (three) times daily. 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   bisacodyl (DULCOLAX) 10 MG suppository Place 10 mg rectally daily as needed for moderate constipation.   bisacodyl (DULCOLAX) 5 MG EC tablet Take 5 mg by mouth daily as needed for moderate constipation.   carbamide peroxide (DEBROX) 6.5 % OTIC solution 5 drops as needed (impaction). 5 drops to affected ear, otic (ear). At bedtime, PRN, Identify cerumen impaction by direct visualization by use of otoscope. Place 5 drops to affected ear QHS x3 nights then irrigate with bulb syringe. If not effective notify MD.   clonazePAM (KLONOPIN) 0.25 MG disintegrating tablet Take 1 tablet (0.25 mg total) by mouth 2 (two) times daily. Morning and evening   diclofenac Sodium (VOLTAREN) 1 % GEL Apply 2 g topically 2 (two) times daily as needed (pain). apply to lower back area   fluPHENAZine (PROLIXIN) 1 MG tablet Take 1 mg by mouth at bedtime.   furosemide (LASIX) 20 MG tablet Take 20 mg by mouth daily.   guaiFENesin (MUCINEX) 600 MG 12 hr tablet Take 600 mg by mouth daily as needed (Cold or cogestion).   hydrocortisone valerate cream (WESTCORT) 0.2 % Apply to rash on face and neck topically every 12 hours as needed for Rash related to Rash and other nonspecific skin eruption   ketoconazole (NIZORAL) 2 % cream Apply 1 application  topically 2 (two) times daily. APPLY TO RASH ON FACE AND NECK TWICE DAILY AS NEEDED   ketoconazole (NIZORAL) 2 % shampoo Apply 1 application. topically 2 (two) times a week. Tuesday and Friday   loratadine (CLARITIN) 10 MG tablet Take 10 mg by mouth daily.   magnesium hydroxide (MILK OF MAGNESIA) 400 MG/5ML suspension  Take 30 mLs by mouth daily as needed for mild constipation.   memantine (NAMENDA) 10 MG tablet Take 10 mg by mouth 2 (two) times daily.   Multiple Vitamins-Minerals (SENTRY SENIOR) TABS Take 1 tablet by mouth daily. 500-300-250 mcg [DX: Vitamin deficiency, unspecified]   OYSTER SHELL PO Take 250 mg by mouth in the morning and at bedtime.   Polyethylene Glycol 3350 (MIRALAX PO) Take 17 g by mouth See admin instructions. Take 17 gm, oral twice a day, every other day. Miralax 17 gm + 4-8oz fluid mizture BID   SENNOSIDES PO Take 1 tablet by mouth at bedtime.   spironolactone (ALDACTONE) 25 MG tablet Take 12.5 mg by mouth daily.   triamcinolone cream (KENALOG) 0.1 % Apply 1 application  topically as needed (rash). Apply  BID as needed to rash to chest, behind L ear, left posterior neck.   No facility-administered encounter medications on file as of 08/09/2023.    Review of Systems  Unable to perform ROS: Dementia  Constitutional:  Negative for fever.  HENT:  Negative for sore throat.   Respiratory:  Negative for cough.   Cardiovascular:  Negative for chest pain.  Gastrointestinal:  Negative for abdominal pain.  Genitourinary:  Negative for dysuria and hematuria.  Neurological:  Negative for dizziness.    Immunization History  Administered Date(s) Administered   Fluad Quad(high Dose 65+) 06/26/2023   Influenza Split 07/23/2013   Influenza Whole 07/05/2009, 06/26/2018   Influenza-Unspecified 07/29/2014, 06/14/2015, 07/11/2016, 07/15/2017, 06/27/2019, 07/05/2020, 07/13/2021, 07/21/2022   Moderna Covid-19 Fall Seasonal Vaccine 61yrs & older 07/10/2023   Moderna SARS-COV2 Booster Vaccination 02/09/2022   Moderna Sars-Covid-2 Vaccination 09/26/2019, 10/24/2019, 08/02/2020   PFIZER(Purple Top)SARS-COV-2 Vaccination 09/26/2019   PPD Test 06/17/2013   Pneumococcal Conjugate-13 07/22/2017   Pneumococcal Polysaccharide-23 05/05/2009   Respiratory Syncytial Virus Vaccine,Recomb Aduvanted(Arexvy)  10/12/2022   Td 05/05/2009, 07/16/2019   Unspecified SARS-COV-2 Vaccination 02/21/2021, 06/13/2021   Zoster Recombinant(Shingrix) 09/27/2021, 12/05/2021   Zoster, Live 01/09/2011   Pertinent  Health Maintenance Due  Topic Date Due   INFLUENZA VACCINE  Completed   DEXA SCAN  Completed      06/09/2022   10:00 AM 11/23/2022   11:33 AM 12/17/2022    3:49 PM 02/15/2023    2:45 PM 03/20/2023   12:00 PM  Fall Risk  Falls in the past year?  1 0 0 0  Was there an injury with Fall?  1 0 0 0  Fall Risk Category Calculator  2 0 0 0  (RETIRED) Patient Fall Risk Level High fall risk      Patient at Risk for Falls Due to  History of fall(s);Impaired balance/gait History of fall(s);Impaired balance/gait History of fall(s);Impaired balance/gait History of fall(s)  Fall risk Follow up  Falls evaluation completed Falls evaluation completed Falls evaluation completed Falls evaluation completed;Education provided;Falls prevention discussed   Functional Status Survey:    Vitals:   08/09/23 1547  BP: (!) 156/66  Pulse: 66  Resp: 18  Temp: (!) 97.4 F (36.3 C)  SpO2: 97%  Weight: 130 lb 6.4 oz (59.1 kg)  Height: 5\' 5"  (1.651 m)   Body mass index is 21.7 kg/m. Physical Exam Constitutional:      Appearance: Normal appearance.  HENT:     Head: Normocephalic and atraumatic.  Cardiovascular:     Rate and Rhythm: Normal rate and regular rhythm.  Pulmonary:     Effort: Pulmonary effort is normal. No respiratory distress.     Breath sounds: Normal breath sounds. No wheezing.  Abdominal:     General: Bowel sounds are normal. There is no distension.     Tenderness: There is no abdominal tenderness. There is no guarding or rebound.  Musculoskeletal:        General: No swelling or tenderness.  Neurological:     Mental Status: She is alert. Mental status is at baseline.     Sensory: No sensory deficit.     Motor: No weakness.     Labs reviewed: Recent Labs    05/09/23 0000  NA 139  K 4.3   CL 103  CO2 28*  BUN 26*  CREATININE 1.1  CALCIUM 8.9   No results for input(s): "AST", "ALT", "ALKPHOS", "BILITOT", "PROT", "ALBUMIN" in the last 8760 hours. Recent Labs    05/09/23  0000  WBC 5.6  HGB 11.4*  HCT 34*  PLT 280   Lab Results  Component Value Date   TSH 1.62 08/15/2021   Lab Results  Component Value Date   HGBA1C 5.6 06/08/2022   Lab Results  Component Value Date   CHOL 201 (H) 06/08/2022   HDL 71 06/08/2022   LDLCALC 113 (H) 06/08/2022   LDLDIRECT 190.0 03/24/2012   TRIG 84 06/08/2022   CHOLHDL 2.8 06/08/2022    Significant Diagnostic Results in last 30 days:  No results found.  Assessment/Plan  Major Neurocognitive disorder with behavioral manifestations No recent agitation as per nursing staff  1 - No difficulty either subjectively or objectively  2 - Complains of forgetting the location of objects--subjective word-finding difficulties  3 - Decreased job functioning evident to co-workers. Difficulty in traveling to new locations. Decreased organizational capacity*  4 - Decreased ability to perform complex tasks (e.g., planning dinner for guests, handling. personal finances, difficulty marketing, etc.)  5 - Requires assistance in choosing proper clothing to wear for the day, season, or occasion (e.g., a patient may wear the same clothing repeatedly unless supervised)*  6a - Improperly putting on clothes without assistance or prompting (e.g., may put street clothes on overnight clothes, put shoes on wrong feet, or have difficulty buttoning clothing) occasionally or more frequently over the past weeks*  6b - Unable to bathe properly (e.g., difficulty adjusting bathwater temp.) occasionally or more frequently over the past weeks*  6c - Inability to handle mechanics of toileting (e.g., forgets to flush the toilet, does not wipe properly or properly dispose of toilet tissue) occasionally or more frequently over the past weeks*  6d - Urinary  incontinence occasionally or more frequently over the past weeks*  6e - Fecal incontinence occasionally or more frequently over the past weeks*  7a - Ability to speak limited to approximately a half-dozen different intelligible words or fewer in an average day or the course of an intensive interview  7b - Speech ability is limited to using a single intelligible word on an average day or in an intensive interview (the person may repeat the word over and over)  7c - Ambulatory ability is lost (cannot walk without personal assistance)  7d - Cannot sit up without assistance  7e - Loss of ability to smile  69f - Loss of ability to hold head up independently Fast score 6d- 7a Cont with namenda, clonazepam, fluphenazine   HTN  Denies being dizzy  Will monitor bp  Cont with spironolactone  GERD  Denies acid reflux Cont with maalox   Constipation  Cont with miralax prn   OA  Stable  Cont with tylenol prn   BASIC METABOLIC PANEL GLUCOSE 89 mg/dL 32-67 Final  Fasting reference interval UREA NITROGEN (BUN) 26 mg/dL 1-24 H Final CREATININE 1.14 mg/dL 5.80-9.98 H Final EGFR 48 mL/min/1.73 m2 > OR = 60 L Final BUN/CREATININE RATIO 23 (calc) 6-22 H Final SODIUM 139 mmol/L 135-146 Final POTASSIUM 4.3 mmol/L 3.5-5.3 Final CHLORIDE 103 mmol/L 98-110 Final CARBON DIOXIDE 28 mmol/L 20-32 Final CALCIUM 8.9 mg/dL 3.3-82.5 Final  CBC (H/H, RBC, INDICES, WBC, PLT) WHITE BLOOD CELL COUNT 5.6 Thousand/u L 3.8-10.8 Final RED BLOOD CELL COUNT 3.61 Million/uL 3.80-5.10 L Final HEMOGLOBIN 11.4 g/dL 05.3-97.6 L Final HEMATOCRIT 33.8 % 35.0-45.0 L Final MCV 93.6 fL 80.0-100.0 Final MCH 31.6 pg 27.0-33.0 Final MCHC 33.7 g/dL 73.4-19.3 Final RDW 79.0 % 11.0-15.0 Final PLATELET COUNT 280 Thousand/u L 140-400 Final MPV 10.2 fL 7.5-12.5  Electrical engineer Communication: care plan discussed with the nursing staff  Labs/tests ordered:  none  30  Total time spent for obtaining  history,  performing a medically appropriate examination and evaluation, reviewing the tests,  , documenting clinical information in the electronic or other health record, independently interpreting results and communicating results to the patient/family/caregiver, care coordination (not separately reported)

## 2023-08-16 ENCOUNTER — Non-Acute Institutional Stay (SKILLED_NURSING_FACILITY): Payer: Medicare Other | Admitting: Nurse Practitioner

## 2023-08-16 ENCOUNTER — Encounter: Payer: Self-pay | Admitting: Nurse Practitioner

## 2023-08-16 DIAGNOSIS — K5909 Other constipation: Secondary | ICD-10-CM

## 2023-08-16 DIAGNOSIS — F0390 Unspecified dementia without behavioral disturbance: Secondary | ICD-10-CM

## 2023-08-16 DIAGNOSIS — M159 Polyosteoarthritis, unspecified: Secondary | ICD-10-CM | POA: Diagnosis not present

## 2023-08-16 DIAGNOSIS — K219 Gastro-esophageal reflux disease without esophagitis: Secondary | ICD-10-CM

## 2023-08-16 DIAGNOSIS — F323 Major depressive disorder, single episode, severe with psychotic features: Secondary | ICD-10-CM

## 2023-08-16 DIAGNOSIS — I10 Essential (primary) hypertension: Secondary | ICD-10-CM | POA: Diagnosis not present

## 2023-08-16 DIAGNOSIS — Z66 Do not resuscitate: Secondary | ICD-10-CM

## 2023-08-16 NOTE — Assessment & Plan Note (Signed)
blood pressure is controlled on diuretics. Bun/creat 26/1.1 05/09/23

## 2023-08-16 NOTE — Assessment & Plan Note (Signed)
supportive care in memory care unit [facility] since Flu 08/2021. CT head was negative for acute process. Taking Memantine. MMSE 21/30 10/13/22

## 2023-08-16 NOTE — Assessment & Plan Note (Signed)
stable, on Famotidine, Maalox qd/prn, Hgb 11.4 05/09/23

## 2023-08-16 NOTE — Progress Notes (Signed)
Location:  [facility] Room Number: 101-A Place of Service:  SNF [phone]) Provider:  Estefan Pattison Melony Overly, MD  [name], MD as PCP - General (Internal Medicine) [name], MD (Ophthalmology) [name], MD (Psychiatry) Jaiah Weigel X, NP as Nurse Practitioner (Nurse Practitioner) [name], MD as Consulting Physician (Orthopedic Surgery)  Extended Emergency Contact Information Primary Emergency Contact: [name], [place] Work Phone: [phone] Relation: Son Secondary Emergency Contact: [name] Home Phone: [phone] Relation: Sister  Code Status:  DNR Goals of care: Advanced Directive information    08/16/2023    1:52 PM  Advanced Directives  Does Patient Have a Medical Advance Directive? Yes  Type of Estate agent of [place];Living will;Out of facility DNR (pink MOST or yellow form)  Does patient want to make changes to medical advance directive? No - Patient declined  Copy of Healthcare Power of Attorney in Chart? Yes - validated most recent copy scanned in chart (See row information)  Pre-existing out of facility DNR order (yellow form or pink MOST form) Pink MOST form placed in chart (order not valid for inpatient use);Yellow form placed in chart (order not valid for inpatient use)     Chief Complaint  Patient presents with   Acute Visit    Delusions    HPI:  Pt is a 82 y.o. female seen today for an acute visit for reported the patient's delusions/false beliefs in the past 2-3 weeks. She believes someone is entering through the wall of her bathroom and replaces her dentures with larger size. She called her son almost daily to tell him that she needs new ones. No obvious damage to dentures.    HTN, blood pressure is controlled on diuretics. Bun/creat 26/1.1 05/09/23             OA on Tylenol,  ambulates with walker.              GERD, stable, on Famotidine, Maalox qd/prn, Hgb 11.4 05/09/23             Depression/anxiety/hallucination, resumed Amitriptyline at 50mg  qd-failed GDR, off Librium, continued Fluphenazine, on Clonazepam, TSH 1.62 08/15/21             Dementia, supportive care in memory care unit [facility] since Flu 08/2021. CT head was negative for acute process. Taking Memantine. MMSE 21/30 10/13/22             Chronic constipation, on prn Dulcolax po/suppository, prn MOM, MiraLax qd/bid alternative.              Allergic rhinitis, takes Claritin, Flonase, Mucinex        OP, stopped taking Prolia due to cost, ? Allergic to Fosamax.  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    Past Medical History:  Diagnosis Date   Allergy    Anemia    Anxiety    Asthma    CAD (coronary artery disease)    Depression    Diverticulosis    Dysfunction of eustachian tube    Elevated LFTs    Esophageal reflux    Esophageal stricture 2002   Hemorrhoids    History of rectal polyps    HLD (hyperlipidemia)    HTN (hypertension)    Hypertonicity of bladder    Irritable bowel syndrome with constipation    Obesity  Optic neuritis    Osteoporosis    Peptic ulcer disease    Peripheral neuropathy    Trigeminal neuralgia    prior injury   Visual loss, bilateral    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, 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   Vitamin D deficiency    Past Surgical History:  Procedure Laterality Date   ABDOMINAL HYSTERECTOMY     ANAL RECTAL MANOMETRY N/A 07/05/2014   Procedure: ANO RECTAL MANOMETRY;  Surgeon: Romie Levee, MD;  Location: [facility];  Service: Endoscopy;  Laterality: N/A;   APPENDECTOMY     CAROTID ENDARTERECTOMY     COLONOSCOPY  2008, 2015   TONSILLECTOMY     UPPER GASTROINTESTINAL ENDOSCOPY  2002    Allergies  Allergen Reactions   Biaxin [Clarithromycin]    Clarithromycin    Doxycycline    Fosamax  [Alendronate Sodium]    Geodon [Ziprasidone Hydrochloride]    Lipitor [Atorvastatin Calcium]    Penicillins    Pravachol [Pravastatin Sodium]    Statins    Sulfonamide Derivatives    Zithromax [Azithromycin]    Zocor [Simvastatin]     Outpatient Encounter Medications as of 08/16/2023  Medication Sig   acetaminophen (TYLENOL) 500 MG tablet Take 1,000 mg by mouth every 8 (eight) hours as needed for fever, headache or mild pain. Give 500 mg by mouth three times a day related to PAIN, UNSPECIFIED   alum & mag hydroxide-simeth (GERI-LANTA) 200-200-20 MG/5ML suspension Take 30 mLs by mouth at bedtime. And every 4 hours as needed   amitriptyline (ELAVIL) 50 MG tablet Take 50 mg by mouth daily.   Artificial Tears ophthalmic solution Place 1 drop into both eyes 3 (three) times daily. 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   bisacodyl (DULCOLAX) 10 MG suppository Place 10 mg rectally daily as needed for moderate constipation.   bisacodyl (DULCOLAX) 5 MG EC tablet Take 5 mg by mouth daily as needed for moderate constipation.   carbamide peroxide (DEBROX) 6.5 % OTIC solution 5 drops as needed (impaction). 5 drops to affected ear, otic (ear). At bedtime, PRN, Identify cerumen impaction by direct visualization by use of otoscope. Place 5 drops to affected ear QHS x3 nights then irrigate with bulb syringe. If not effective notify MD.   clonazePAM (KLONOPIN) 0.25 MG disintegrating tablet Take 1 tablet (0.25 mg total) by mouth 2 (two) times daily. Morning and evening   fluPHENAZine (PROLIXIN) 1 MG tablet Take 1 mg by mouth at bedtime.   furosemide (LASIX) 20 MG tablet Take 20 mg by mouth daily.   guaiFENesin (MUCINEX) 600 MG 12 hr tablet Take 600 mg by mouth daily as needed (Cold or cogestion).   ketoconazole (NIZORAL) 2 % shampoo Apply 1 application. topically 2 (two) times a week. Tuesday and Friday   loratadine (CLARITIN) 10 MG  tablet Take 10 mg by mouth daily.   magnesium hydroxide (MILK OF MAGNESIA) 400 MG/5ML suspension Take 30 mLs by mouth daily as needed for mild constipation.   memantine (NAMENDA) 10 MG tablet Take 10 mg by mouth 2 (two) times daily.   Multiple Vitamins-Minerals (SENTRY SENIOR) TABS Take 1 tablet by mouth daily. 500-300-250 mcg [DX: Vitamin deficiency, unspecified]   OYSTER SHELL PO Take 250 mg by mouth in the morning and at bedtime.   Polyethylene Glycol 3350 (MIRALAX PO) Take 17 g by mouth See admin instructions. Take 17 gm, oral twice a  day, every other day. Miralax 17 gm + 4-8oz fluid mizture BID   SENNOSIDES PO Take 1 tablet by mouth at bedtime.   spironolactone (ALDACTONE) 25 MG tablet Take 12.5 mg by mouth daily.   diclofenac Sodium (VOLTAREN) 1 % GEL Apply 2 g topically 2 (two) times daily as needed (pain). apply to lower back area (Patient not taking: Reported on 08/16/2023)   hydrocortisone valerate cream (WESTCORT) 0.2 % Apply to rash on face and neck topically every 12 hours as needed for Rash related to Rash and other nonspecific skin eruption (Patient not taking: Reported on 08/16/2023)   ketoconazole (NIZORAL) 2 % cream Apply 1 application  topically 2 (two) times daily. APPLY TO RASH ON FACE AND NECK TWICE DAILY AS NEEDED (Patient not taking: Reported on 08/16/2023)   triamcinolone cream (KENALOG) 0.1 % Apply 1 application  topically as needed (rash). Apply BID as needed to rash to chest, behind L ear, left posterior neck. (Patient not taking: Reported on 08/16/2023)   No facility-administered encounter medications on file as of 08/16/2023.    Review of Systems  Constitutional:  Negative for appetite change, fatigue and fever.  HENT:  Positive for hearing loss. Negative for congestion, rhinorrhea and voice change.   Eyes:  Positive for visual disturbance.       Low vision  Respiratory:  Negative for cough and shortness of breath.   Cardiovascular:  Negative for leg swelling.   Gastrointestinal:  Negative for abdominal pain and constipation.  Genitourinary:  Negative for dysuria, frequency and urgency.  Musculoskeletal:  Positive for arthralgias and gait problem.  Skin:  Negative for color change.  Neurological:  Negative for speech difficulty, weakness and light-headedness.       Memory lapses  Psychiatric/Behavioral:  Positive for confusion. Negative for behavioral problems, hallucinations and sleep disturbance. The patient is not nervous/anxious.        Delusion    Immunization History  Administered Date(s) Administered   Fluad Quad(high Dose 65+) 06/26/2023   Influenza Split 07/23/2013   Influenza Whole 07/05/2009, 06/26/2018   Influenza-Unspecified 07/29/2014, 06/14/2015, 07/11/2016, 07/15/2017, 06/27/2019, 07/05/2020, 07/13/2021, 07/21/2022   Moderna Covid-19 Fall Seasonal Vaccine 68yrs & older 07/10/2023   Moderna SARS-COV2 Booster Vaccination 02/09/2022   Moderna Sars-Covid-2 Vaccination 09/26/2019, 10/24/2019, 08/02/2020   PFIZER(Purple Top)SARS-COV-2 Vaccination 09/26/2019   PPD Test 06/17/2013   Pneumococcal Conjugate-13 07/22/2017   Pneumococcal Polysaccharide-23 05/05/2009   Respiratory Syncytial Virus Vaccine,Recomb Aduvanted(Arexvy) 10/12/2022   Td 05/05/2009, 07/16/2019   Unspecified SARS-COV-2 Vaccination 02/21/2021, 06/13/2021   Zoster Recombinant(Shingrix) 09/27/2021, 12/05/2021   Zoster, Live 01/09/2011   Pertinent  Health Maintenance Due  Topic Date Due   INFLUENZA VACCINE  Completed   DEXA SCAN  Completed      06/09/2022   10:00 AM 11/23/2022   11:33 AM 12/17/2022    3:49 PM 02/15/2023    2:45 PM 03/20/2023   12:00 PM  Fall Risk  Falls in the past year?  1 0 0 0  Was there an injury with Fall?  1 0 0 0  Fall Risk Category Calculator  2 0 0 0  (RETIRED) Patient Fall Risk Level High fall risk      Patient at Risk for Falls Due to  History of fall(s);Impaired balance/gait History of fall(s);Impaired balance/gait History of  fall(s);Impaired balance/gait History of fall(s)  Fall risk Follow up  Falls evaluation completed Falls evaluation completed Falls evaluation completed Falls evaluation completed;Education provided;Falls prevention discussed   Functional Status Survey:  Vitals:   08/16/23 1350  BP: (!) 140/69  Pulse: 73  Resp: 18  Temp: (!) 97.4 F (36.3 C)  SpO2: 95%  Weight: 130 lb 6.4 oz (59.1 kg)  Height: 5\' 5"  (1.651 m)   Body mass index is 21.7 kg/m. Physical Exam Constitutional:      Appearance: Normal appearance.  HENT:     Head: Normocephalic.     Nose: Nose normal.     Mouth/Throat:     Mouth: Mucous membranes are moist.  Eyes:     Extraocular Movements: Extraocular movements intact.     Conjunctiva/sclera: Conjunctivae normal.     Pupils: Pupils are equal, round, and reactive to light.     Comments: Legally blind  Cardiovascular:     Rate and Rhythm: Normal rate and regular rhythm.     Heart sounds: No murmur heard.    Comments: DP pulses R+L present.  Pulmonary:     Effort: Pulmonary effort is normal.     Breath sounds: No rales.  Abdominal:     General: Bowel sounds are normal.     Palpations: Abdomen is soft.     Tenderness: There is no abdominal tenderness.  Genitourinary:    Comments: From previous examination a pinto bean sized cyst like lump palpated near the clitoris, asymptomatic.  Musculoskeletal:     Cervical back: Normal range of motion and neck supple.     Right lower leg: No edema.     Left lower leg: No edema.  Skin:    General: Skin is warm and dry.  Neurological:     General: No focal deficit present.     Mental Status: She is alert. Mental status is at baseline.     Gait: Gait abnormal.     Comments: Oriented to person, place.   Psychiatric:        Mood and Affect: Mood normal.        Behavior: Behavior normal.        Thought Content: Thought content normal.     Comments: The patient appears at her baseline mentation during my examination.       Labs reviewed: Recent Labs    05/09/23 0000  NA 139  K 4.3  CL 103  CO2 28*  BUN 26*  CREATININE 1.1  CALCIUM 8.9   No results for input(s): "AST", "ALT", "ALKPHOS", "BILITOT", "PROT", "ALBUMIN" in the last 8760 hours. Recent Labs    05/09/23 0000  WBC 5.6  HGB 11.4*  HCT 34*  PLT 280   Lab Results  Component Value Date   TSH 1.62 08/15/2021   Lab Results  Component Value Date   HGBA1C 5.6 06/08/2022   Lab Results  Component Value Date   CHOL 201 (H) 06/08/2022   HDL 71 06/08/2022   LDLCALC 113 (H) 06/08/2022   LDLDIRECT 190.0 03/24/2012   TRIG 84 06/08/2022   CHOLHDL 2.8 06/08/2022    Significant Diagnostic Results in last 30 days:  No results found.  Assessment/Plan Depression, psychotic (HCC) reported the patient's delusions/false beliefs in the past 2-3 weeks. She believes someone is entering through the wall of her bathroom and replaces her dentures with larger size. She called her son almost daily to tell him that she needs new ones. No obvious damage to dentures.  Depression/anxiety/hallucination, resumed Amitriptyline at 50mg  qd-failed GDR, off Librium, continued Fluphenazine, on Clonazepam, TSH 1.62 08/15/21 Dentist to check denture fittings Update CBC/diff, CMP/eGFR, TSH May increase Amitriptyline or Flupenazine if needed.  Essential hypertension blood pressure is controlled on diuretics. 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   GERD (gastroesophageal reflux disease)  stable, on Famotidine, Maalox qd/prn, Hgb 11.4 05/09/23  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HCC) supportive care in memory care unit [facility] since Flu 08/2021. CT head was negative for acute process. Taking Memantine. MMSE 21/30 10/13/22      Family/ staff Communication: plan of care reviewed with the patient and charge nurse.   Labs/tests ordered:  CBC/diff, CMP/eGFR, TSH  Time spend 30 minutes.

## 2023-08-16 NOTE — Assessment & Plan Note (Signed)
Stable,  on prn Dulcolax po/suppository, prn MOM, MiraLax qd/bid alternative.

## 2023-08-16 NOTE — Assessment & Plan Note (Signed)
reported the patient's delusions/false beliefs in the past 2-3 weeks. She believes someone is entering through the wall of her bathroom and replaces her dentures with larger size. She called her son almost daily to tell him that she needs new ones. No obvious damage to dentures.  Depression/anxiety/hallucination, resumed Amitriptyline at 50mg  qd-failed GDR, off Librium, continued Fluphenazine, on Clonazepam, TSH 1.62 08/15/21 Dentist to check denture fittings Update CBC/diff, CMP/eGFR, TSH May increase Amitriptyline or Flupenazine if needed.

## 2023-08-16 NOTE — Assessment & Plan Note (Signed)
on Tylenol, ambulates with walker

## 2023-08-20 LAB — CBC: RBC: 3.82 — AB (ref 3.87–5.11)

## 2023-08-20 LAB — BASIC METABOLIC PANEL
BUN: 24 — AB (ref 4–21)
CO2: 25 — AB (ref 13–22)
Chloride: 102 (ref 99–108)
Creatinine: 1.1 (ref 0.5–1.1)
Glucose: 84
Potassium: 4.1 meq/L (ref 3.5–5.1)
Sodium: 140 (ref 137–147)

## 2023-08-20 LAB — HEPATIC FUNCTION PANEL
ALT: 12 U/L (ref 7–35)
AST: 22 (ref 13–35)
Alkaline Phosphatase: 116 (ref 25–125)
Bilirubin, Total: 0.4

## 2023-08-20 LAB — COMPREHENSIVE METABOLIC PANEL
Albumin: 3.9 (ref 3.5–5.0)
Calcium: 9.1 (ref 8.7–10.7)
Globulin: 2.5
eGFR: 48

## 2023-08-20 LAB — CBC AND DIFFERENTIAL
HCT: 36 (ref 36–46)
Hemoglobin: 11.8 — AB (ref 12.0–16.0)
Neutrophils Absolute: 3086
Platelets: 347 10*3/uL (ref 150–400)
WBC: 5.9

## 2023-08-20 LAB — TSH: TSH: 1.62 (ref 0.41–5.90)

## 2023-09-03 ENCOUNTER — Encounter: Payer: Self-pay | Admitting: Nurse Practitioner

## 2023-09-03 ENCOUNTER — Non-Acute Institutional Stay (SKILLED_NURSING_FACILITY): Payer: Medicare Other | Admitting: Nurse Practitioner

## 2023-09-03 DIAGNOSIS — M8000XA Age-related osteoporosis with current pathological fracture, unspecified site, initial encounter for fracture: Secondary | ICD-10-CM

## 2023-09-03 DIAGNOSIS — M159 Polyosteoarthritis, unspecified: Secondary | ICD-10-CM | POA: Diagnosis not present

## 2023-09-03 DIAGNOSIS — K5909 Other constipation: Secondary | ICD-10-CM

## 2023-09-03 DIAGNOSIS — I10 Essential (primary) hypertension: Secondary | ICD-10-CM

## 2023-09-03 DIAGNOSIS — F0390 Unspecified dementia without behavioral disturbance: Secondary | ICD-10-CM

## 2023-09-03 DIAGNOSIS — K219 Gastro-esophageal reflux disease without esophagitis: Secondary | ICD-10-CM | POA: Diagnosis not present

## 2023-09-03 DIAGNOSIS — F323 Major depressive disorder, single episode, severe with psychotic features: Secondary | ICD-10-CM

## 2023-09-03 DIAGNOSIS — J302 Other seasonal allergic rhinitis: Secondary | ICD-10-CM

## 2023-09-03 DIAGNOSIS — J3089 Other allergic rhinitis: Secondary | ICD-10-CM

## 2023-09-03 NOTE — Assessment & Plan Note (Signed)
on Tylenol, ambulates with walker 

## 2023-09-03 NOTE — Assessment & Plan Note (Signed)
blood pressure is controlled on diuretics. Bun/creat 24/1.1 08/20/23

## 2023-09-03 NOTE — Assessment & Plan Note (Signed)
supportive care in memory care unit [facility] since Flu 08/2021. CT head was negative for acute process. Taking Memantine. MMSE 21/30 10/13/22 

## 2023-09-03 NOTE — Assessment & Plan Note (Signed)
Stable, on prn MOM, prn Bisacodyl, taking MiraLax and Senna daily.

## 2023-09-03 NOTE — Assessment & Plan Note (Signed)
Stable, takes Claritin  

## 2023-09-03 NOTE — Assessment & Plan Note (Signed)
stopped taking Prolia due to cost, ? Allergic to Fosamax.  her son declined DEXA 

## 2023-09-03 NOTE — Assessment & Plan Note (Signed)
At her baseline, hx of depression/anxiety/hallucination/delusions/false believes, resumed Amitriptyline at 50mg -failed GDR, off Librium, on Fluphenazine, Clonazepam, TSH 1.62 08/20/23

## 2023-09-03 NOTE — Assessment & Plan Note (Signed)
stable, on  Maalox qhs, Hgb 11.8 08/20/23

## 2023-09-03 NOTE — Progress Notes (Unsigned)
Location:  [facility] Room Number: 101-A Place of Service:  SNF [phone]) Provider:  Freddi Schrager Hanley Seamen, MD  [name], MD as PCP - General (Internal Medicine) [name], MD (Ophthalmology) [name], MD (Psychiatry) Massie Mees X, NP as Nurse Practitioner (Nurse Practitioner) [name], MD as Consulting Physician (Orthopedic Surgery)  Extended Emergency Contact Information Primary Emergency Contact: [name], [place] Work Phone: [phone] Relation: Son Secondary Emergency Contact: [name] Home Phone: [phone] Relation: Sister  Code Status:  DNR Goals of care: Advanced Directive information    09/03/2023   11:03 AM  Advanced Directives  Does Patient Have a Medical Advance Directive? Yes  Type of Estate agent of [place];Living will;Out of facility DNR (pink MOST or yellow form)  Does patient want to make changes to medical advance directive? No - Patient declined  Copy of Healthcare Power of Attorney in Chart? Yes - validated most recent copy scanned in chart (See row information)  Pre-existing out of facility DNR order (yellow form or pink MOST form) Pink MOST/Yellow Form most recent copy in chart - Physician notified to receive inpatient order     Chief Complaint  Patient presents with   Routine    HPI:  Pt is a 82 y.o. female seen today for medical management of chronic diseases.     HTN, blood pressure is controlled on diuretics. Bun/creat 24/1.1 08/20/23             OA on Tylenol, ambulates with walker.              GERD, stable, on  Maalox qhs, Hgb 11.8 08/20/23             Depression/anxiety/hallucination/delusions/false believes, resumed Amitriptyline at 50mg -failed GDR, off Librium, on Fluphenazine, Clonazepam, TSH 1.62 08/20/23             Dementia, supportive care in memory care  unit [facility] since Flu 08/2021. CT head was negative for acute process. Taking Memantine. MMSE 21/30 10/13/22             Chronic constipation, on prn MOM, prn Bisacodyl, taking MiraLax and Senna daily.              Allergic rhinitis, takes Claritin       OP, stopped taking Prolia due to cost, ? Allergic to Fosamax.  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, 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   Vitamin D deficiency  Past Surgical History:  Procedure Laterality Date   ABDOMINAL HYSTERECTOMY     ANAL RECTAL MANOMETRY N/A 07/05/2014   Procedure: ANO RECTAL MANOMETRY;  Surgeon: Romie Levee, MD;  Location: [facility];  Service: Endoscopy;  Laterality: N/A;   APPENDECTOMY     CAROTID ENDARTERECTOMY     COLONOSCOPY  2008, 2015   TONSILLECTOMY     UPPER GASTROINTESTINAL ENDOSCOPY  2002    Allergies  Allergen Reactions   Biaxin [Clarithromycin]    Clarithromycin    Doxycycline    Fosamax [Alendronate Sodium]    Geodon [Ziprasidone Hydrochloride]    Lipitor [Atorvastatin Calcium]    Penicillins    Pravachol [Pravastatin Sodium]    Statins    Sulfonamide Derivatives    Zithromax [Azithromycin]    Zocor [Simvastatin]     Outpatient Encounter Medications as of 09/03/2023  Medication Sig   acetaminophen (TYLENOL)  500 MG tablet Take 1,000 mg by mouth every 8 (eight) hours as needed for fever, headache or mild pain. Give 500 mg by mouth three times a day related to PAIN, UNSPECIFIED   alum & mag hydroxide-simeth (GERI-LANTA) 200-200-20 MG/5ML suspension Take 30 mLs by mouth at bedtime. And every 4 hours as needed   amitriptyline (ELAVIL) 50 MG tablet Take 50 mg by mouth daily.   Artificial Tears ophthalmic solution Place 1 drop into both eyes 3 (three) times daily. 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   bisacodyl (DULCOLAX) 10 MG suppository Place 10 mg rectally daily as needed for moderate constipation.   bisacodyl (DULCOLAX) 5 MG EC tablet Take 5 mg by mouth daily as needed for moderate constipation.   carbamide peroxide (DEBROX) 6.5 % OTIC solution 5 drops as needed (impaction). 5 drops to affected ear, otic (ear). At bedtime, PRN, Identify cerumen impaction by direct visualization by use of otoscope. Place 5 drops to affected ear QHS x3 nights then irrigate with bulb syringe. If not effective notify MD.   fluPHENAZine (PROLIXIN) 1 MG tablet Take 1 mg by mouth at bedtime.   furosemide (LASIX) 20 MG tablet Take 20 mg by mouth daily.   ketoconazole (NIZORAL) 2 % shampoo Apply 1 application. topically 2 (two) times a week. Tuesday and Friday   loratadine (CLARITIN) 10 MG tablet Take 10 mg by mouth daily.   magnesium hydroxide (MILK OF MAGNESIA) 400 MG/5ML suspension Take 30 mLs by mouth daily as needed for mild constipation.   memantine (NAMENDA) 10 MG tablet Take 10 mg by mouth 2 (two) times daily.   Multiple Vitamins-Minerals (SENTRY SENIOR) TABS Take 1 tablet by mouth daily. 500-300-250 mcg [DX: Vitamin deficiency, unspecified]   OYSTER SHELL PO Take 250 mg by mouth in the morning and at bedtime.   Polyethylene Glycol 3350 (MIRALAX PO) Take 17 g by mouth See admin instructions. Take 17 gm, oral twice a day, every other day.  Miralax 17 gm + 4-8oz fluid mizture BID   saccharomyces boulardii (DAILY PROBIOTIC SUPPLEMENT) 250 MG capsule Take 250 mg by mouth daily.   SENNOSIDES PO Take 1 tablet by mouth at bedtime.   spironolactone (ALDACTONE) 25 MG tablet Take 12.5 mg by mouth daily.   [DISCONTINUED] clonazePAM (KLONOPIN) 0.25 MG disintegrating tablet Take 1 tablet (0.25 mg total) by mouth 2 (two) times daily. Morning and evening   diclofenac Sodium (VOLTAREN) 1 % GEL Apply 2 g topically 2 (two) times daily as needed (pain). apply to  lower back area (Patient not taking: Reported on 08/16/2023)   guaiFENesin (MUCINEX) 600 MG 12 hr tablet Take 600 mg by mouth daily as needed (Cold or cogestion). (Patient not taking: Reported on 09/03/2023)   hydrocortisone valerate cream (WESTCORT) 0.2 % Apply to rash on face and neck topically every 12 hours as needed for Rash related to Rash and other nonspecific skin eruption (Patient not taking: Reported on 08/16/2023)   ketoconazole (NIZORAL) 2 % cream Apply 1 application  topically 2 (two) times daily. APPLY TO RASH ON FACE AND NECK TWICE DAILY AS NEEDED (Patient not taking: Reported on 09/03/2023)   triamcinolone cream (KENALOG) 0.1 % Apply 1 application  topically as needed (rash). Apply BID as needed to rash to chest, behind L ear, left posterior neck. (Patient not taking: Reported on 08/16/2023)   No facility-administered encounter medications on file as of 09/03/2023.    Review of Systems  Constitutional:  Negative for appetite change, fatigue and fever.  HENT:  Positive for hearing loss. Negative for congestion, rhinorrhea and voice change.   Eyes:  Positive for visual disturbance.       Low vision  Respiratory:  Negative for cough and shortness of breath.   Cardiovascular:  Negative for leg swelling.  Gastrointestinal:  Negative for abdominal pain and constipation.  Genitourinary:  Negative for dysuria, frequency and urgency.  Musculoskeletal:  Positive for arthralgias and  gait problem.  Skin:  Negative for color change.  Neurological:  Negative for speech difficulty, weakness and light-headedness.       Memory lapses  Psychiatric/Behavioral:  Positive for confusion. Negative for behavioral problems, hallucinations and sleep disturbance. The patient is not nervous/anxious.        Delusion    Immunization History  Administered Date(s) Administered   Fluad Quad(high Dose 65+) 06/26/2023   Influenza Split 07/23/2013   Influenza Whole 07/05/2009, 06/26/2018   Influenza-Unspecified 07/29/2014, 06/14/2015, 07/11/2016, 07/15/2017, 06/27/2019, 07/05/2020, 07/13/2021, 07/21/2022   Moderna Covid-19 Fall Seasonal Vaccine 72yrs & older 07/10/2023   Moderna SARS-COV2 Booster Vaccination 02/09/2022   Moderna Sars-Covid-2 Vaccination 09/26/2019, 10/24/2019, 08/02/2020   PFIZER(Purple Top)SARS-COV-2 Vaccination 09/26/2019   PPD Test 06/17/2013   Pneumococcal Conjugate-13 07/22/2017   Pneumococcal Polysaccharide-23 05/05/2009   Respiratory Syncytial Virus Vaccine,Recomb Aduvanted(Arexvy) 10/12/2022   Td 05/05/2009, 07/16/2019   Unspecified SARS-COV-2 Vaccination 02/21/2021, 06/13/2021   Zoster Recombinant(Shingrix) 09/27/2021, 12/05/2021   Zoster, Live 01/09/2011   Pertinent  Health Maintenance Due  Topic Date Due   INFLUENZA VACCINE  Completed   DEXA SCAN  Completed      06/09/2022   10:00 AM 11/23/2022   11:33 AM 12/17/2022    3:49 PM 02/15/2023    2:45 PM 03/20/2023   12:00 PM  Fall Risk  Falls in the past year?  1 0 0 0  Was there an injury with Fall?  1 0 0 0  Fall Risk Category Calculator  2 0 0 0  (RETIRED) Patient Fall Risk Level High fall risk      Patient at Risk for Falls Due to  History of fall(s);Impaired balance/gait History of fall(s);Impaired balance/gait History of fall(s);Impaired balance/gait History of fall(s)  Fall risk Follow up  Falls evaluation completed Falls evaluation completed Falls evaluation completed Falls evaluation  completed;Education provided;Falls prevention discussed   Functional Status Survey:    Vitals:   09/03/23 1036  BP: 136/68  Pulse: 73  Resp: 17  Temp: 98.1 F (36.7 C)  SpO2: 95%  Weight: 130 lb 12.8 oz (59.3 kg)  Height: 5\' 5"  (1.651 m)   Body mass index is 21.77 kg/m. Physical Exam Constitutional:      Appearance: Normal appearance.  HENT:     Head: Normocephalic.     Nose: Nose normal.     Mouth/Throat:     Mouth: Mucous membranes are moist.  Eyes:     Extraocular Movements: Extraocular movements intact.     Conjunctiva/sclera: Conjunctivae normal.     Pupils: Pupils are equal, round, and reactive to light.     Comments: Legally blind  Cardiovascular:     Rate and Rhythm: Normal rate and regular rhythm.     Heart sounds: No murmur heard.    Comments: DP pulses R+L present.  Pulmonary:     Effort: Pulmonary effort is normal.     Breath sounds: No rales.  Abdominal:     General: Bowel sounds are normal.     Palpations: Abdomen is soft.     Tenderness: There is no abdominal tenderness.  Genitourinary:    Comments: From previous examination a pinto bean sized cyst like lump palpated near the clitoris, asymptomatic.  Musculoskeletal:     Cervical back: Normal range of motion and neck supple.     Right lower leg: No edema.     Left lower leg: No edema.  Skin:    General: Skin is warm and dry.  Neurological:     General: No focal deficit present.     Mental Status: She is alert. Mental status is at baseline.     Gait: Gait abnormal.     Comments: Oriented to person, place.   Psychiatric:        Mood and Affect: Mood normal.        Behavior: Behavior normal.        Thought Content: Thought content normal.     Comments: The patient appears at her baseline mentation during my examination.      Labs reviewed: Recent Labs    05/09/23 0000 08/20/23 0000  NA 139 140  K 4.3 4.1  CL 103 102  CO2 28* 25*  BUN 26* 24*  CREATININE 1.1 1.1  CALCIUM 8.9 9.1    Recent Labs    08/20/23 0000  AST 22  ALT 12  ALKPHOS 116  ALBUMIN 3.9   Recent Labs    05/09/23 0000 08/20/23 0000  WBC 5.6 5.9  NEUTROABS  --  3,086.00  HGB 11.4* 11.8*  HCT 34* 36  PLT 280 347   Lab Results  Component Value Date   TSH 1.62 08/20/2023   Lab Results  Component Value Date   HGBA1C 5.6 06/08/2022   Lab Results  Component Value Date   CHOL 201 (H) 06/08/2022   HDL 71 06/08/2022   LDLCALC 113 (H) 06/08/2022   LDLDIRECT 190.0 03/24/2012   TRIG 84 06/08/2022   CHOLHDL 2.8 06/08/2022    Significant Diagnostic Results in last 30 days:  No results found.  Assessment/Plan Essential hypertension blood pressure is controlled on diuretics. 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   GERD (gastroesophageal reflux disease) stable, on  Maalox qhs, Hgb 11.8 08/20/23  Depression, psychotic (HCC) At her baseline, hx of depression/anxiety/hallucination/delusions/false believes, resumed Amitriptyline at 50mg -failed GDR, off Librium, on Fluphenazine, Clonazepam, TSH 1.62 08/20/23  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HCC)  supportive care in memory care unit [facility] since Flu 08/2021. CT head was negative for acute process. Taking Memantine. MMSE 21/30  10/13/22  Constipation Stable, on prn MOM, prn Bisacodyl, taking MiraLax and Senna daily.   Seasonal and perennial allergic rhinitis Stable,  takes Claritin        Osteoporosis  stopped taking Prolia due to cost, ? Allergic to Fosamax.  her son declined DEXA     Family/ staff Communication: plan of care reviewed with the patient and charge nurse.   Labs/tests ordered:  none  Time spend 30 minutes.

## 2023-09-04 ENCOUNTER — Other Ambulatory Visit: Payer: Self-pay | Admitting: Adult Health

## 2023-09-04 DIAGNOSIS — F0390 Unspecified dementia without behavioral disturbance: Secondary | ICD-10-CM

## 2023-09-04 MED ORDER — CLONAZEPAM 0.25 MG PO TBDP: 0.2500 mg | ORAL_TABLET | Freq: Two times a day (BID) | ORAL | 0 refills | Status: DC

## 2023-09-27 ENCOUNTER — Encounter: Payer: Self-pay | Admitting: Nurse Practitioner

## 2023-09-27 ENCOUNTER — Non-Acute Institutional Stay (SKILLED_NURSING_FACILITY): Payer: Medicare Other | Admitting: Nurse Practitioner

## 2023-09-27 DIAGNOSIS — M8000XA Age-related osteoporosis with current pathological fracture, unspecified site, initial encounter for fracture: Secondary | ICD-10-CM

## 2023-09-27 DIAGNOSIS — K5909 Other constipation: Secondary | ICD-10-CM

## 2023-09-27 DIAGNOSIS — J309 Allergic rhinitis, unspecified: Secondary | ICD-10-CM | POA: Diagnosis not present

## 2023-09-27 DIAGNOSIS — K219 Gastro-esophageal reflux disease without esophagitis: Secondary | ICD-10-CM

## 2023-09-27 DIAGNOSIS — F0390 Unspecified dementia without behavioral disturbance: Secondary | ICD-10-CM

## 2023-09-27 DIAGNOSIS — M159 Polyosteoarthritis, unspecified: Secondary | ICD-10-CM

## 2023-09-27 DIAGNOSIS — I10 Essential (primary) hypertension: Secondary | ICD-10-CM

## 2023-09-27 DIAGNOSIS — F323 Major depressive disorder, single episode, severe with psychotic features: Secondary | ICD-10-CM

## 2023-09-27 NOTE — Assessment & Plan Note (Signed)
takes Claritin

## 2023-09-27 NOTE — Assessment & Plan Note (Signed)
stopped taking Prolia due to cost, ? Allergic to Fosamax.  her son declined DEXA 

## 2023-09-27 NOTE — Assessment & Plan Note (Signed)
supportive care in memory care unit [facility] since Flu 08/2021. CT head was negative for acute process. Taking Memantine. MMSE 21/30 10/13/22 

## 2023-09-27 NOTE — Assessment & Plan Note (Addendum)
 blood pressure is loose controlled on diuretics. Bun/creat 24/1.1 08/20/23

## 2023-09-27 NOTE — Progress Notes (Signed)
 Location:   SNF [facility] Room Number: 101 Place of Service:  SNF (31) Provider: [facility] Onofrio Klemp NP  Charlanne Fredia CROME, MD  Patient Care Team: Charlanne Fredia CROME, MD as PCP - General (Internal Medicine) Cleatus Collar, MD (Ophthalmology) Tasia Lung, MD (Psychiatry) Mychael Smock X, NP as Nurse Practitioner (Nurse Practitioner) Duwayne Purchase, MD as Consulting Physician (Orthopedic Surgery)  Extended Emergency Contact Information Primary Emergency Contact: Franchot Francis [place], [place] Work Phone: [phone] Relation: Son Secondary Emergency Contact: Ellie Leonor [place] Home Phone: [phone] Relation: Sister  Code Status:  DNR Goals of care: Advanced Directive information    09/03/2023   11:03 AM  Advanced Directives  Does Patient Have a Medical Advance Directive? Yes  Type of Estate Agent of [place];Living will;Out of facility DNR (pink MOST or yellow form)  Does patient want to make changes to medical advance directive? No - Patient declined  Copy of Healthcare Power of Attorney in Chart? Yes - validated most recent copy scanned in chart (See row information)  Pre-existing out of facility DNR order (yellow form or pink MOST form) Pink MOST/Yellow Form most recent copy in chart - Physician notified to receive inpatient order     Chief Complaint  Patient presents with  . Medical Management of Chronic Issues    HPI:  Pt is a 83 y.o. female seen today for medical management of chronic diseases.     HTN, blood pressure is controlled on diuretics. Bun/creat 24/1.1 08/20/23             OA on Tylenol , ambulates with walker.              GERD, stable, on  Maalox qhs, Hgb 11.8 08/20/23             Depression/anxiety/hallucination/delusions/false believes, resumed Amitriptyline at 50mg -failed GDR, off Librium , on Fluphenazine, Clonazepam , TSH 1.62 08/20/23             Dementia, supportive care in  memory care unit [facility] since Flu 08/2021. CT head was negative for acute process. Taking Memantine . MMSE 21/30 10/13/22             Chronic constipation, on prn MOM, prn Bisacodyl , taking MiraLax  and Senna daily.              Allergic rhinitis, takes Claritin       OP, stopped taking Prolia due to cost, ? Allergic to Fosamax.  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          Past Medical History:  Diagnosis Date  . Allergy   . Anemia   . Anxiety   . Asthma   . CAD (coronary artery disease)   . Depression   . Diverticulosis   . Dysfunction of eustachian tube   . Elevated LFTs   . Esophageal reflux   . Esophageal stricture 2002  . Hemorrhoids   . History of rectal polyps   . HLD (hyperlipidemia)   . HTN (hypertension)   . Hypertonicity of bladder   . Irritable bowel syndrome with constipation   . Obesity   . Optic neuritis   . Osteoporosis   . Peptic ulcer disease   . Peripheral neuropathy   . Trigeminal neuralgia    prior injury  . 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  Vitamin D  deficiency    Past Surgical History:  Procedure Laterality Date  . ABDOMINAL HYSTERECTOMY    . ANAL RECTAL MANOMETRY N/A 07/05/2014   Procedure: ANO RECTAL MANOMETRY;  Surgeon: Bernarda Ned, MD;  Location: [facility];  Service: Endoscopy;  Laterality: N/A;  . APPENDECTOMY    . CAROTID ENDARTERECTOMY    . COLONOSCOPY  2008, 2015  . TONSILLECTOMY    . UPPER GASTROINTESTINAL ENDOSCOPY  2002    Allergies  Allergen Reactions  . Biaxin [Clarithromycin]   . Clarithromycin   . Doxycycline   . Fosamax [Alendronate Sodium]   . Geodon [Ziprasidone Hydrochloride]   . Lipitor [Atorvastatin Calcium]   . Penicillins   . Pravachol [Pravastatin Sodium]   . Statins   . Sulfonamide Derivatives   . Zithromax [Azithromycin]   . Zocor [Simvastatin]     Allergies as of 09/27/2023        Reactions   Biaxin [clarithromycin]    Clarithromycin    Doxycycline    Fosamax [alendronate Sodium]    Geodon [ziprasidone Hydrochloride]    Lipitor [atorvastatin Calcium]    Penicillins    Pravachol [pravastatin Sodium]    Statins    Sulfonamide Derivatives    Zithromax [azithromycin]    Zocor [simvastatin]         Medication List        Accurate as of September 27, 2023 11:59 PM. If you have any questions, ask your nurse or doctor.          acetaminophen  500 MG tablet Commonly known as: TYLENOL  Take 1,000 mg by mouth every 8 (eight) hours as needed for fever, headache or mild pain. Give 500 mg by mouth three times a day related to PAIN, UNSPECIFIED   amitriptyline 50 MG tablet Commonly known as: ELAVIL Take 50 mg by mouth daily.   Artificial Tears ophthalmic solution Place 1 drop into both eyes 3 (three) times daily. 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   bisacodyl  10 MG suppository Commonly known as: DULCOLAX Place 10 mg rectally daily as needed for moderate constipation.   bisacodyl  5 MG EC tablet Commonly known as: DULCOLAX Take 5 mg by mouth daily as needed for moderate constipation.   carbamide peroxide 6.5 % OTIC solution Commonly known as: DEBROX 5 drops as needed (impaction). 5 drops to affected ear, otic (ear). At bedtime, PRN, Identify cerumen impaction by direct visualization by use of otoscope. Place 5 drops to affected ear QHS x3 nights then irrigate with bulb syringe. If not effective notify MD.   clonazePAM  0.25 MG disintegrating tablet Commonly known as: KLONOPIN  Take 1 tablet (0.25 mg total) by mouth 2 (two) times daily. Morning and evening   Daily Probiotic Supplement 250 MG capsule Generic drug: saccharomyces boulardii Take 250 mg by mouth daily.   diclofenac Sodium 1 % Gel Commonly known as: VOLTAREN Apply 2 g topically 2 (two) times daily as needed  (pain). apply to lower back area   fluPHENAZine 1 MG tablet Commonly known as: PROLIXIN Take 1 mg by mouth at bedtime.   furosemide 20 MG tablet Commonly known as: LASIX Take 20 mg by mouth daily.   Geri-Lanta 200-200-20 MG/5ML suspension Generic drug: alum & mag hydroxide-simeth Take 30 mLs by mouth at bedtime. And every 4 hours as needed   guaiFENesin 600 MG 12 hr tablet Commonly known as: MUCINEX Take 600 mg by mouth daily as  needed (Cold or cogestion).   hydrocortisone valerate cream 0.2 % Commonly known as: WESTCORT Apply to rash on face and neck topically every 12 hours as needed for Rash related to Rash and other nonspecific skin eruption   ketoconazole 2 % shampoo Commonly known as: NIZORAL Apply 1 application. topically 2 (two) times a week. Tuesday and Friday   ketoconazole 2 % cream Commonly known as: NIZORAL Apply 1 application  topically 2 (two) times daily. APPLY TO RASH ON FACE AND NECK TWICE DAILY AS NEEDED   loratadine 10 MG tablet Commonly known as: CLARITIN Take 10 mg by mouth daily.   magnesium hydroxide 400 MG/5ML suspension Commonly known as: MILK OF MAGNESIA Take 30 mLs by mouth daily as needed for mild constipation.   memantine  10 MG tablet Commonly known as: NAMENDA  Take 10 mg by mouth 2 (two) times daily.   MIRALAX  PO Take 17 g by mouth See admin instructions. Take 17 gm, oral twice a day, every other day. Miralax  17 gm + 4-8oz fluid mizture BID   OYSTER SHELL PO Take 250 mg by mouth in the morning and at bedtime.   SENNOSIDES PO Take 1 tablet by mouth at bedtime.   Sentry Senior Tabs Take 1 tablet by mouth daily. 500-300-250 mcg [DX: Vitamin deficiency, unspecified]   spironolactone 25 MG tablet Commonly known as: ALDACTONE Take 12.5 mg by mouth daily.   triamcinolone cream 0.1 % Commonly known as: KENALOG Apply 1 application  topically as needed (rash). Apply BID as needed to rash to chest, behind L ear, left posterior neck.         Review of Systems  Constitutional:  Negative for appetite change, fatigue and fever.  HENT:  Positive for hearing loss. Negative for congestion, rhinorrhea and voice change.   Eyes:  Positive for visual disturbance.       Low vision  Respiratory:  Negative for cough and shortness of breath.   Cardiovascular:  Negative for leg swelling.  Gastrointestinal:  Negative for abdominal pain and constipation.  Genitourinary:  Negative for dysuria, frequency and urgency.  Musculoskeletal:  Positive for arthralgias and gait problem.  Skin:  Negative for color change.  Neurological:  Negative for speech difficulty, weakness and light-headedness.       Memory lapses  Psychiatric/Behavioral:  Positive for confusion. Negative for behavioral problems, hallucinations and sleep disturbance. The patient is not nervous/anxious.        Delusion    Immunization History  Administered Date(s) Administered  . Fluad Quad(high Dose 65+) 06/26/2023  . Influenza Split 07/23/2013  . Influenza Whole 07/05/2009, 06/26/2018  . Influenza-Unspecified 07/29/2014, 06/14/2015, 07/11/2016, 07/15/2017, 06/27/2019, 07/05/2020, 07/13/2021, 07/21/2022  . Moderna Covid-19 Fall Seasonal Vaccine 58yrs & older 07/10/2023  . Moderna SARS-COV2 Booster Vaccination 02/09/2022  . Moderna Sars-Covid-2 Vaccination 09/26/2019, 10/24/2019, 08/02/2020  . PFIZER(Purple Top)SARS-COV-2 Vaccination 09/26/2019  . PPD Test 06/17/2013  . Pneumococcal Conjugate-13 07/22/2017  . Pneumococcal Polysaccharide-23 05/05/2009  . Respiratory Syncytial Virus Vaccine,Recomb Aduvanted(Arexvy) 10/12/2022  . Td 05/05/2009, 07/16/2019  . Unspecified SARS-COV-2 Vaccination 02/21/2021, 06/13/2021  . Zoster Recombinant(Shingrix) 09/27/2021, 12/05/2021  . Zoster, Live 01/09/2011   Pertinent  Health Maintenance Due  Topic Date Due  . INFLUENZA VACCINE  Completed  . DEXA SCAN  Completed      06/09/2022   10:00 AM 11/23/2022   11:33 AM 12/17/2022     3:49 PM 02/15/2023    2:45 PM 03/20/2023   12:00 PM  Fall Risk  Falls in the past year?  1 0 0 0  Was there an injury with Fall?  1 0 0 0  Fall Risk Category Calculator  2 0 0 0  (RETIRED) Patient Fall Risk Level High fall risk      Patient at Risk for Falls Due to  History of fall(s);Impaired balance/gait History of fall(s);Impaired balance/gait History of fall(s);Impaired balance/gait History of fall(s)  Fall risk Follow up  Falls evaluation completed Falls evaluation completed Falls evaluation completed Falls evaluation completed;Education provided;Falls prevention discussed   Functional Status Survey:    Vitals:   09/27/23 1313 10/01/23 1127  BP: (!) 148/72 (!) 144/69  Pulse: 69   Resp: 17   Temp: (!) 97.5 F (36.4 C)   SpO2: 95%   Weight: 137 lb (62.1 kg)    Body mass index is 22.8 kg/m. Physical Exam Constitutional:      Appearance: Normal appearance.  HENT:     Head: Normocephalic.     Nose: Nose normal.     Mouth/Throat:     Mouth: Mucous membranes are moist.  Eyes:     Extraocular Movements: Extraocular movements intact.     Conjunctiva/sclera: Conjunctivae normal.     Pupils: Pupils are equal, round, and reactive to light.     Comments: Legally blind  Cardiovascular:     Rate and Rhythm: Normal rate and regular rhythm.     Heart sounds: No murmur heard.    Comments: DP pulses R+L present.  Pulmonary:     Effort: Pulmonary effort is normal.     Breath sounds: No rales.  Abdominal:     General: Bowel sounds are normal.     Palpations: Abdomen is soft.     Tenderness: There is no abdominal tenderness.  Genitourinary:    Comments: From previous examination a pinto bean sized cyst like lump palpated near the clitoris, asymptomatic.  Musculoskeletal:     Cervical back: Normal range of motion and neck supple.     Right lower leg: No edema.     Left lower leg: No edema.  Skin:    General: Skin is warm and dry.  Neurological:     General: No focal deficit  present.     Mental Status: She is alert. Mental status is at baseline.     Gait: Gait abnormal.     Comments: Oriented to person, place.   Psychiatric:        Mood and Affect: Mood normal.        Behavior: Behavior normal.        Thought Content: Thought content normal.     Comments: The patient appears at her baseline mentation during my examination.     Labs reviewed: Recent Labs    05/09/23 0000 08/20/23 0000  NA 139 140  K 4.3 4.1  CL 103 102  CO2 28* 25*  BUN 26* 24*  CREATININE 1.1 1.1  CALCIUM 8.9 9.1   Recent Labs    08/20/23 0000  AST 22  ALT 12  ALKPHOS 116  ALBUMIN 3.9   Recent Labs    05/09/23 0000 08/20/23 0000  WBC 5.6 5.9  NEUTROABS  --  3,086.00  HGB 11.4* 11.8*  HCT 34* 36  PLT 280 347   Lab Results  Component Value Date   TSH 1.62 08/20/2023   Lab Results  Component Value Date   HGBA1C 5.6 06/08/2022   Lab Results  Component Value Date   CHOL 201 (H) 06/08/2022   HDL 71 06/08/2022  LDLCALC 113 (H) 06/08/2022   LDLDIRECT 190.0 03/24/2012   TRIG 84 06/08/2022   CHOLHDL 2.8 06/08/2022    Significant Diagnostic Results in last 30 days:  No results found.  Assessment/Plan  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HCC)  supportive care in memory care unit [facility] since Flu 08/2021. CT head was negative for acute process. Taking Memantine . MMSE 21/30 10/13/22  Constipation on prn MOM, prn Bisacodyl , taking MiraLax  and Senna daily.   Allergic rhinitis takes Claritin        Osteoporosis stopped taking Prolia due to cost, ? Allergic to Fosamax.  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   Essential  hypertension blood pressure is loose controlled on diuretics. Bun/creat 24/1.1 08/20/23   Family/ staff Communication: plan of care reviewed with the patient and charge nurse.   Labs/tests ordered:  none  Time spend 30 minutes .

## 2023-09-27 NOTE — Assessment & Plan Note (Signed)
 Depression/anxiety/hallucination/delusions/false believes, resumed Amitriptyline at 50mg -failed GDR, off Librium, on Fluphenazine, Clonazepam, TSH 1.62 08/20/23

## 2023-09-27 NOTE — Assessment & Plan Note (Signed)
on Tylenol, ambulates with walker 

## 2023-09-27 NOTE — Assessment & Plan Note (Signed)
 stable, on  Maalox qhs, Hgb 11.8 08/20/23

## 2023-09-27 NOTE — Assessment & Plan Note (Signed)
 on prn MOM, prn Bisacodyl, taking MiraLax and Senna daily.

## 2023-10-10 ENCOUNTER — Non-Acute Institutional Stay: Payer: Medicare Other | Admitting: Nurse Practitioner

## 2023-10-10 ENCOUNTER — Encounter: Payer: Self-pay | Admitting: Nurse Practitioner

## 2023-10-10 DIAGNOSIS — Z0000 Encounter for general adult medical examination without abnormal findings: Secondary | ICD-10-CM | POA: Diagnosis not present

## 2023-10-10 NOTE — Progress Notes (Signed)
Subjective:   Jean [name] is a 83 y.o. female who presents for Medicare Annual (Subsequent) preventive examination.  Visit Complete: In person  Patient Medicare AWV questionnaire was completed by the patient on 10/10/23; I have confirmed that all information answered by patient is correct and no changes since this date.        Objective:    Today's Vitals   10/10/23 1556  BP: (!) 149/63  Pulse: 72  Resp: 16  Temp: 98.1 F (36.7 C)  SpO2: 95%  Weight: 137 lb (62.1 kg)   Body mass index is 22.8 kg/m.     09/03/2023   11:03 AM 08/16/2023    1:52 PM 08/09/2023    3:56 PM 06/20/2023   10:19 AM 05/31/2023   11:15 AM 05/06/2023   11:35 AM 03/20/2023   10:21 AM  Advanced Directives  Does Patient Have a Medical Advance Directive? Yes Yes Yes Yes Yes Yes Yes  Type of Estate agent of [place];Living will;Out of facility DNR (pink MOST or yellow form) Healthcare Power of [place];Living will;Out of facility DNR (pink MOST or yellow form) Healthcare Power of [place];Living will;Out of facility DNR (pink MOST or yellow form) Healthcare Power of [place];Living will;Out of facility DNR (pink MOST or yellow form) Healthcare Power of [place];Living will;Out of facility DNR (pink MOST or yellow form) Healthcare Power of [place];Living will;Out of facility DNR (pink MOST or yellow form) Healthcare Power of [place];Living will;Out of facility DNR (pink MOST or yellow form)  Does patient want to make changes to medical advance directive? No - Patient declined No - Patient declined No - Patient declined No - Patient declined No - Patient declined No - Patient declined No - Patient declined  Copy of Healthcare Power of Attorney in Chart? Yes - validated most recent copy scanned in chart (See row information) Yes - validated most recent copy scanned in chart (See row information) Yes - validated most recent copy scanned in chart (See row information) Yes - validated most  recent copy scanned in chart (See row information) Yes - validated most recent copy scanned in chart (See row information) Yes - validated most recent copy scanned in chart (See row information) Yes - validated most recent copy scanned in chart (See row information)  Pre-existing out of facility DNR order (yellow form or pink MOST form) Pink MOST/Yellow Form most recent copy in chart - Physician notified to receive inpatient order Pink MOST form placed in chart (order not valid for inpatient use);Yellow form placed in chart (order not valid for inpatient use) Pink MOST form placed in chart (order not valid for inpatient use);Yellow form placed in chart (order not valid for inpatient use) Pink MOST form placed in chart (order not valid for inpatient use);Yellow form placed in chart (order not valid for inpatient use) Pink MOST form placed in chart (order not valid for inpatient use);Yellow form placed in chart (order not valid for inpatient use) Pink MOST form placed in chart (order not valid for inpatient use);Yellow form placed in chart (order not valid for inpatient use) Pink MOST form placed in chart (order not valid for inpatient use);Yellow form placed in chart (order not valid for inpatient use)    Current Medications (verified) Outpatient Encounter Medications as of 10/10/2023  Medication Sig   acetaminophen (TYLENOL) 500 MG tablet Take 1,000 mg by mouth every 8 (eight) hours as needed for fever, headache or mild pain. Give 500 mg by mouth three times a day  related to PAIN, UNSPECIFIED   alum & mag hydroxide-simeth (GERI-LANTA) 200-200-20 MG/5ML suspension Take 30 mLs by mouth at bedtime. And every 4 hours as needed   amitriptyline (ELAVIL) 50 MG tablet Take 50 mg by mouth daily.   Artificial Tears ophthalmic solution Place 1 drop into both eyes 3 (three) times daily. 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   bisacodyl  (DULCOLAX) 10 MG suppository Place 10 mg rectally daily as needed for moderate constipation.   bisacodyl (DULCOLAX) 5 MG EC tablet Take 5 mg by mouth daily as needed for moderate constipation.   carbamide peroxide (DEBROX) 6.5 % OTIC solution 5 drops as needed (impaction). 5 drops to affected ear, otic (ear). At bedtime, PRN, Identify cerumen impaction by direct visualization by use of otoscope. Place 5 drops to affected ear QHS x3 nights then irrigate with bulb syringe. If not effective notify MD.   clonazePAM (KLONOPIN) 0.25 MG disintegrating tablet Take 1 tablet (0.25 mg total) by mouth 2 (two) times daily. Morning and evening   diclofenac Sodium (VOLTAREN) 1 % GEL Apply 2 g topically 2 (two) times daily as needed (pain). apply to lower back area (Patient not taking: Reported on 08/16/2023)   fluPHENAZine (PROLIXIN) 1 MG tablet Take 1 mg by mouth at bedtime.   furosemide (LASIX) 20 MG tablet Take 20 mg by mouth daily.   guaiFENesin (MUCINEX) 600 MG 12 hr tablet Take 600 mg by mouth daily as needed (Cold or cogestion). (Patient not taking: Reported on 09/03/2023)   hydrocortisone valerate cream (WESTCORT) 0.2 % Apply to rash on face and neck topically every 12 hours as needed for Rash related to Rash and other nonspecific skin eruption (Patient not taking: Reported on 08/16/2023)   ketoconazole (NIZORAL) 2 % cream Apply 1 application  topically 2 (two) times daily. APPLY TO RASH ON FACE AND NECK TWICE DAILY AS NEEDED (Patient not taking: Reported on 09/03/2023)   ketoconazole (NIZORAL) 2 % shampoo Apply 1 application. topically 2 (two) times a week. Tuesday and Friday   loratadine (CLARITIN) 10 MG tablet Take 10 mg by mouth daily.   magnesium hydroxide (MILK OF MAGNESIA) 400 MG/5ML suspension Take 30 mLs by mouth daily as needed for mild constipation.   memantine (NAMENDA) 10 MG tablet Take 10 mg by mouth 2 (two) times daily.   Multiple Vitamins-Minerals (SENTRY SENIOR) TABS Take 1 tablet by mouth  daily. 500-300-250 mcg [DX: Vitamin deficiency, unspecified]   OYSTER SHELL PO Take 250 mg by mouth in the morning and at bedtime.   Polyethylene Glycol 3350 (MIRALAX PO) Take 17 g by mouth See admin instructions. Take 17 gm, oral twice a day, every other day. Miralax 17 gm + 4-8oz fluid mizture BID   saccharomyces boulardii (DAILY PROBIOTIC SUPPLEMENT) 250 MG capsule Take 250 mg by mouth daily.   SENNOSIDES PO Take 1 tablet by mouth at bedtime.   spironolactone (ALDACTONE) 25 MG tablet Take 12.5 mg by mouth daily.   triamcinolone cream (KENALOG) 0.1 % Apply 1 application  topically as needed (rash). Apply BID as needed to rash to chest, behind L ear, left posterior neck. (Patient not taking: Reported on 08/16/2023)   No facility-administered encounter medications on file as of 10/10/2023.    Allergies (verified) Biaxin [clarithromycin], Clarithromycin, Doxycycline, Fosamax [alendronate sodium], Geodon [ziprasidone hydrochloride], Lipitor [atorvastatin calcium], Penicillins, Pravachol [pravastatin sodium], Statins, Sulfonamide derivatives, Zithromax [azithromycin], and Zocor [simvastatin]  History: 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, 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   Vitamin D deficiency    Past Surgical History:  Procedure Laterality Date   ABDOMINAL HYSTERECTOMY     ANAL RECTAL MANOMETRY N/A 07/05/2014    Procedure: ANO RECTAL MANOMETRY;  Surgeon: Romie Levee, MD;  Location: [facility];  Service: Endoscopy;  Laterality: N/A;   APPENDECTOMY     CAROTID ENDARTERECTOMY     COLONOSCOPY  2008, 2015   TONSILLECTOMY     UPPER GASTROINTESTINAL ENDOSCOPY  2002   Family History  Problem Relation Age of Onset   Alzheimer's disease Mother    Coronary artery disease Father    Heart attack Father    Heart disease Father    Anuerysm Father        AAA   Ovarian cancer Sister    Lung cancer Brother    Heart disease Brother    Anuerysm Brother        AAA   Diabetes Neg Hx    Colon cancer Neg Hx    Social History   Socioeconomic History   Marital status: Divorced    Spouse name: Not on file   Number of children: 2   Years of education: Not on file   Highest education level: Not on file  Occupational History   Occupation: retired    Associate Professor: RETIRED  Tobacco Use   Smoking status: Never   Smokeless tobacco: Never  Vaping Use   Vaping status: Never Used  Substance and Sexual Activity   Alcohol use: No    Alcohol/week: 0.0 standard drinks of alcohol   Drug use: No   Sexual activity: Never  Other Topics Concern   Not on file  Social History Narrative   HSG, business college   Married 59-72yrs/divorced   2 son-'60; 2 granddaughters   Work: Diplomatic Services operational officer, travel clerk   Lives-friends home in a studio apartment, moved to [place] 04/2013   Patient never smoked. Did have second hand smoke exposure childhood and work   Alcohol none   Walks with walker   Social Drivers of Health   Financial Resource Strain: Low Risk  (06/10/2018)   Overall Financial Resource Strain (CARDIA)    Difficulty of Paying Living Expenses: Not hard at all  Food Insecurity: No Food Insecurity (06/10/2018)   Hunger Vital Sign    Worried About Running Out of Food in the Last Year: Never true    Ran Out of Food in the Last Year: Never true  Transportation Needs: No Transportation Needs (06/10/2018)   PRAPARE -  Administrator, Civil Service (Medical): No    Lack of Transportation (Non-Medical): No  Physical Activity: Sufficiently Active (06/10/2018)  Exercise Vital Sign    Days of Exercise per Week: 5 days    Minutes of Exercise per Session: 30 min  Stress: No Stress Concern Present (06/10/2018)   [employer] of Occupational Health - Occupational Stress Questionnaire    Feeling of Stress : Not at all  Social Connections: Moderately Isolated (06/10/2018)   Social Connection and Isolation Panel [NHANES]    Frequency of Communication with Friends and Family: Twice a week    Frequency of Social Gatherings with Friends and Family: Twice a week    Attends Religious Services: Never    Database administrator or Organizations: No    Attends Engineer, structural: Never    Marital Status: Divorced    Tobacco Counseling Counseling given: Not Answered   Clinical Intake:  Pre-visit preparation completed: Yes  Pain : No/denies pain     BMI - recorded: 22.8 Nutritional Status: BMI of 19-24  Normal Nutritional Risks: None Diabetes: No  How often do you need to have someone help you when you read instructions, pamphlets, or other written materials from your doctor or pharmacy?: 5 - Always What is the last grade level you completed in school?: college  Interpreter Needed?: No  Information entered by :: Margree Gimbel Nedra Hai NP   Activities of Daily Living    10/10/2023    4:00 PM  In your present state of health, do you have any difficulty performing the following activities:  Hearing? 0  Vision? 1  Difficulty concentrating or making decisions? 1  Walking or climbing stairs? 1  Dressing or bathing? 1  Doing errands, shopping? 1    [name], MD as PCP - General (Internal Medicine) [name], MD (Ophthalmology) [name], MD (Psychiatry) Georgetta Crafton X, NP as Nurse Practitioner (Nurse Practitioner) [name], MD as Consulting  Physician (Orthopedic Surgery)  Indicate any recent Medical Services you may have received from other than [facility] providers in the past year (date may be approximate).     Assessment:   This is a routine wellness examination for Jean [name].  Hearing/Vision screen No results found.   Goals Addressed   None    Depression Screen    10/10/2023    4:01 PM 03/20/2023   12:00 PM 12/17/2022    3:49 PM 06/10/2018    1:48 PM 06/04/2017    3:07 PM 07/29/2014    4:19 PM  PHQ 2/9 Scores  PHQ - 2 Score 0  0 0 0 0  Exception Documentation  Medical reason        Fall Risk    03/20/2023   12:00 PM 02/15/2023    2:45 PM 12/17/2022    3:49 PM 11/23/2022   11:33 AM 06/10/2018    1:48 PM  Fall Risk   Falls in the past year? 0 0 0 1 No  Number falls in past yr: 0 0 0 0   Injury with Fall? 0 0 0 1   Risk for fall due to : History of fall(s) History of fall(s);Impaired balance/gait History of fall(s);Impaired balance/gait History of fall(s);Impaired balance/gait   Follow up Falls evaluation completed;Education provided;Falls prevention discussed Falls evaluation completed Falls evaluation completed Falls evaluation completed     MEDICARE RISK AT HOME:    TIMED UP AND GO:  Was the test performed?  Yes  Length of time to ambulate 10 feet: 15 sec Gait slow and steady with assistive device    Cognitive Function:    10/15/2022  12:29 PM 09/21/2021   10:22 AM 02/07/2018    9:42 AM 06/04/2017    3:09 PM  MMSE - Mini Mental State Exam  Not completed:   Unable to complete    Orientation to time 4 4 5 4   Orientation to Place 4 2 5 5   Registration 3 3 3 3   Attention/ Calculation 3 3 5 5   Recall 0 2 3 2   Language- name 2 objects 2 2 2 2   Language- repeat 1 1 1 1   Language- follow 3 step command 3 3 3 3   Language- read & follow direction 0 0 0 0  Write a sentence 1 1 0 0  Copy design 0 0 0 0  Total score 21 21 27 25      Significant value        Immunizations Immunization History  Administered  Date(s) Administered   Fluad Quad(high Dose 65+) 06/26/2023   Influenza Split 07/23/2013   Influenza Whole 07/05/2009, 06/26/2018   Influenza-Unspecified 07/29/2014, 06/14/2015, 07/11/2016, 07/15/2017, 06/27/2019, 07/05/2020, 07/13/2021, 07/21/2022   Moderna Covid-19 Fall Seasonal Vaccine 81yrs & older 07/10/2023   Moderna SARS-COV2 Booster Vaccination 02/09/2022   Moderna Sars-Covid-2 Vaccination 09/26/2019, 10/24/2019, 08/02/2020   PFIZER(Purple Top)SARS-COV-2 Vaccination 09/26/2019   PPD Test 06/17/2013   Pneumococcal Conjugate-13 07/22/2017   Pneumococcal Polysaccharide-23 05/05/2009   Respiratory Syncytial Virus Vaccine,Recomb Aduvanted(Arexvy) 10/12/2022   Td 05/05/2009, 07/16/2019   Unspecified SARS-COV-2 Vaccination 02/21/2021, 06/13/2021   Zoster Recombinant(Shingrix) 09/27/2021, 12/05/2021   Zoster, Live 01/09/2011    TDAP status: Up to date  Flu Vaccine status: Up to date  Pneumococcal vaccine status: Up to date  Covid-19 vaccine status: Completed vaccines  Qualifies for Shingles Vaccine? Yes   Zostavax completed Yes   Shingrix Completed?: Yes  Screening Tests Health Maintenance  Topic Date Due   Medicare Annual Wellness (AWV)  10/09/2024   DTaP/Tdap/Td (3 - Tdap) 07/15/2029   Pneumonia Vaccine 65+ Years old  Completed   INFLUENZA VACCINE  Completed   DEXA SCAN  Completed   COVID-19 Vaccine  Completed   Zoster Vaccines- Shingrix  Completed   HPV VACCINES  Aged Out    Health Maintenance  There are no preventive care reminders to display for this patient.   Colorectal cancer screening: No longer required.   Mammogram status: No longer required due to aged out.  Bone Density status: Completed 2018. Results reflect: Bone density results: OSTEOPOROSIS. Repeat every never per HPOA years.  Lung Cancer Screening: (Low Dose CT Chest recommended if Age 27-80 years, 20 pack-year currently smoking OR have quit w/in 15years.) does not qualify.   Lung Cancer  Screening Referral: NA  Additional Screening:  Hepatitis C Screening: does not qualify; Completed   Vision Screening: Recommended annual ophthalmology exams for early detection of glaucoma and other disorders of the eye. Is the patient up to date with their annual eye exam?  No  Who is the provider or what is the name of the office in which the patient attends annual eye exams? HPOA will provided if desires.  If pt is not established with a provider, would they like to be referred to a provider to establish care? No .   Dental Screening: Recommended annual dental exams for proper oral hygiene  Diabetic Foot Exam: NA  Community Resource Referral / Chronic Care Management: CRR required this visit?  No   CCM required this visit?  No     Plan:     I have personally reviewed and noted the  following in the patient's chart:   Medical and social history Use of alcohol, tobacco or illicit drugs  Current medications and supplements including opioid prescriptions. Patient is not currently taking opioid prescriptions. Functional ability and status Nutritional status Physical activity Advanced directives List of other physicians Hospitalizations, surgeries, and ER visits in previous 12 months Vitals Screenings to include cognitive, depression, and falls Referrals and appointments  In addition, I have reviewed and discussed with patient certain preventive protocols, quality metrics, and best practice recommendations. A written personalized care plan for preventive services as well as general preventive health recommendations were provided to patient.     Demeshia Sherburne X Zlatan Hornback, NP   10/11/2023   After Visit Summary: (In Person-Declined) Patient declined AVS at this time.

## 2023-11-18 ENCOUNTER — Non-Acute Institutional Stay (SKILLED_NURSING_FACILITY): Payer: Medicare Other | Admitting: Sports Medicine

## 2023-11-18 ENCOUNTER — Encounter: Payer: Self-pay | Admitting: Sports Medicine

## 2023-11-18 DIAGNOSIS — F0390 Unspecified dementia without behavioral disturbance: Secondary | ICD-10-CM

## 2023-11-18 DIAGNOSIS — M7989 Other specified soft tissue disorders: Secondary | ICD-10-CM

## 2023-11-18 DIAGNOSIS — M159 Polyosteoarthritis, unspecified: Secondary | ICD-10-CM

## 2023-11-18 DIAGNOSIS — J309 Allergic rhinitis, unspecified: Secondary | ICD-10-CM | POA: Diagnosis not present

## 2023-11-18 DIAGNOSIS — K5900 Constipation, unspecified: Secondary | ICD-10-CM

## 2023-11-18 NOTE — Progress Notes (Signed)
 Provider:  Dr. Venita Sheffield Location:  [facility] Place of Service:   Memory care   PCP: [name], MD [name], MD as PCP - General (Internal Medicine) [name], MD (Ophthalmology) [name], MD (Psychiatry) Mast, Man X, NP as Nurse Practitioner (Nurse Practitioner) [name], MD as Consulting Physician (Orthopedic Surgery)  Extended Emergency Contact Information Primary Emergency Contact: [name], [place] Work Phone: [phone] Relation: Son Secondary Emergency Contact: [name] Home Phone: [phone] Relation: Sister  Goals of Care: Advanced Directive information    09/03/2023   11:03 AM  Advanced Directives  Does Patient Have a Medical Advance Directive? Yes  Type of Estate agent of [place];Living will;Out of facility DNR (pink MOST or yellow form)  Does patient want to make changes to medical advance directive? No - Patient declined  Copy of Healthcare Power of Attorney in Chart? Yes - validated most recent copy scanned in chart (See row information)  Pre-existing out of facility DNR order (yellow form or pink MOST form) Pink MOST/Yellow Form most recent copy in chart - Physician notified to receive inpatient order       History of Present Illness         83 yr old F with h/o Dementia, HTN, OA, GERD, is evaluated for chronic disease management  Pt seen and examined in the living room  She seems pleasant and comfortable and does not appear to be in distress She knows her name, oriented to time and place  Ambulates independently  No recent falls No agitation as per nursing staff  Pt has no concerns today Denies chest pain, sob, abdominal pain, nausea, vomiting, dysuria, hematuria, bloody or dark stools.   GAD/ Depression  As per staff family member does not want to change regimen On  fluphenazine, amitriptyline, klonipin      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, 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   Vitamin D deficiency    Past Surgical History:  Procedure Laterality Date   ABDOMINAL HYSTERECTOMY     ANAL RECTAL MANOMETRY N/A 07/05/2014   Procedure: ANO RECTAL MANOMETRY;  Surgeon: Romie Levee, MD;  Location: [facility];  Service: Endoscopy;  Laterality: N/A;   APPENDECTOMY     CAROTID ENDARTERECTOMY     COLONOSCOPY  2008, 2015   TONSILLECTOMY     UPPER GASTROINTESTINAL ENDOSCOPY  2002    reports that she has never smoked. She has never used smokeless tobacco. She reports that she does not drink alcohol and does not use drugs. Social History   Socioeconomic History   Marital  status: Divorced    Spouse name: Not on file   Number of children: 2   Years of education: Not on file   Highest education level: Not on file  Occupational History   Occupation: retired    Associate Professor: RETIRED  Tobacco Use   Smoking status: Never   Smokeless tobacco: Never  Vaping Use   Vaping status: Never Used  Substance and Sexual Activity   Alcohol use: No    Alcohol/week: 0.0 standard drinks of alcohol   Drug use: No   Sexual activity: Never  Other Topics Concern   Not on file  Social History Narrative   HSG, business college   Married 59-43yrs/divorced   2 son-'60; 2 granddaughters   Work:  Diplomatic Services operational officer, travel clerk   Lives-friends home in a studio apartment, moved to [place] 04/2013   Patient never smoked. Did have second hand smoke exposure childhood and work   Alcohol none   Walks with walker   Social Drivers of Health   Financial Resource Strain: Low Risk  (06/10/2018)   Overall Financial Resource Strain (CARDIA)    Difficulty of Paying Living Expenses: Not hard at all  Food Insecurity: No Food Insecurity (06/10/2018)   Hunger Vital Sign    Worried About Running Out of Food in the Last Year: Never true    Ran Out of Food in the Last Year: Never true  Transportation Needs: No Transportation Needs (06/10/2018)   PRAPARE - Administrator, Civil Service (Medical): No    Lack of Transportation (Non-Medical): No  Physical Activity: Sufficiently Active (06/10/2018)   Exercise Vital Sign    Days of Exercise per Week: 5 days    Minutes of Exercise per Session: 30 min  Stress: No Stress Concern Present (06/10/2018)   [employer] of Occupational Health - Occupational Stress Questionnaire    Feeling of Stress : Not at all  Social Connections: Moderately Isolated (06/10/2018)   Social Connection and Isolation Panel [NHANES]    Frequency of Communication with Friends and Family: Twice a week    Frequency of Social Gatherings with Friends and Family: Twice a week    Attends Religious Services: Never    Database administrator or Organizations: No    Attends Banker Meetings: Never    Marital Status: Divorced  Catering manager Violence: Not At Risk (06/10/2018)   Humiliation, Afraid, Rape, and Kick questionnaire    Fear of Current or Ex-Partner: No    Emotionally Abused: No    Physically Abused: No    Sexually Abused: No    Functional Status Survey:    Family History  Problem Relation Age of Onset   Alzheimer's disease Mother    Coronary artery disease Father    Heart attack Father    Heart disease Father    Anuerysm Father        AAA   Ovarian  cancer Sister    Lung cancer Brother    Heart disease Brother    Anuerysm Brother        AAA   Diabetes Neg Hx    Colon cancer Neg Hx     Health Maintenance  Topic Date Due   Medicare Annual Wellness (AWV)  10/09/2024   DTaP/Tdap/Td (3 - Tdap) 07/15/2029   Pneumonia Vaccine 62+ Years old  Completed   INFLUENZA VACCINE  Completed   DEXA SCAN  Completed   COVID-19 Vaccine  Completed   Zoster Vaccines- Shingrix  Completed  HPV VACCINES  Aged Out    Allergies  Allergen Reactions   Biaxin [Clarithromycin]    Clarithromycin    Doxycycline    Fosamax [Alendronate Sodium]    Geodon [Ziprasidone Hydrochloride]    Lipitor [Atorvastatin Calcium]    Penicillins    Pravachol [Pravastatin Sodium]    Statins    Sulfonamide Derivatives    Zithromax [Azithromycin]    Zocor [Simvastatin]     Outpatient Encounter Medications as of 11/18/2023  Medication Sig   acetaminophen (TYLENOL) 500 MG tablet Take 1,000 mg by mouth every 8 (eight) hours as needed for fever, headache or mild pain. Give 500 mg by mouth three times a day related to PAIN, UNSPECIFIED   alum & mag hydroxide-simeth (GERI-LANTA) 200-200-20 MG/5ML suspension Take 30 mLs by mouth at bedtime. And every 4 hours as needed   amitriptyline (ELAVIL) 50 MG tablet Take 50 mg by mouth daily.   Artificial Tears ophthalmic solution Place 1 drop into both eyes 3 (three) times daily. 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   bisacodyl (DULCOLAX) 10 MG suppository Place 10 mg rectally daily as needed for moderate constipation.   bisacodyl (DULCOLAX) 5 MG EC tablet Take 5 mg by mouth daily as needed for moderate constipation.   carbamide peroxide (DEBROX) 6.5 % OTIC solution 5 drops as needed (impaction). 5 drops to affected ear, otic (ear). At bedtime, PRN, Identify cerumen impaction by direct visualization by use of otoscope. Place 5 drops to affected ear QHS x3  nights then irrigate with bulb syringe. If not effective notify MD.   clonazePAM (KLONOPIN) 0.25 MG disintegrating tablet Take 1 tablet (0.25 mg total) by mouth 2 (two) times daily. Morning and evening   diclofenac Sodium (VOLTAREN) 1 % GEL Apply 2 g topically 2 (two) times daily as needed (pain). apply to lower back area (Patient not taking: Reported on 08/16/2023)   fluPHENAZine (PROLIXIN) 1 MG tablet Take 1 mg by mouth at bedtime.   furosemide (LASIX) 20 MG tablet Take 20 mg by mouth daily.   guaiFENesin (MUCINEX) 600 MG 12 hr tablet Take 600 mg by mouth daily as needed (Cold or cogestion). (Patient not taking: Reported on 09/03/2023)   hydrocortisone valerate cream (WESTCORT) 0.2 % Apply to rash on face and neck topically every 12 hours as needed for Rash related to Rash and other nonspecific skin eruption (Patient not taking: Reported on 08/16/2023)   ketoconazole (NIZORAL) 2 % cream Apply 1 application  topically 2 (two) times daily. APPLY TO RASH ON FACE AND NECK TWICE DAILY AS NEEDED (Patient not taking: Reported on 09/03/2023)   ketoconazole (NIZORAL) 2 % shampoo Apply 1 application. topically 2 (two) times a week. Tuesday and Friday   loratadine (CLARITIN) 10 MG tablet Take 10 mg by mouth daily.   magnesium hydroxide (MILK OF MAGNESIA) 400 MG/5ML suspension Take 30 mLs by mouth daily as needed for mild constipation.   memantine (NAMENDA) 10 MG tablet Take 10 mg by mouth 2 (two) times daily.   Multiple Vitamins-Minerals (SENTRY SENIOR) TABS Take 1 tablet by mouth daily. 500-300-250 mcg [DX: Vitamin deficiency, unspecified]   OYSTER SHELL PO Take 250 mg by mouth in the morning and at bedtime.   Polyethylene Glycol 3350 (MIRALAX PO) Take 17 g by mouth See admin instructions. Take 17 gm, oral twice a day, every other day. Miralax 17 gm + 4-8oz fluid mizture BID  saccharomyces boulardii (DAILY PROBIOTIC SUPPLEMENT) 250 MG capsule Take 250 mg by mouth daily.   SENNOSIDES PO Take 1 tablet by  mouth at bedtime.   spironolactone (ALDACTONE) 25 MG tablet Take 12.5 mg by mouth daily.   triamcinolone cream (KENALOG) 0.1 % Apply 1 application  topically as needed (rash). Apply BID as needed to rash to chest, behind L ear, left posterior neck. (Patient not taking: Reported on 08/16/2023)   No facility-administered encounter medications on file as of 11/18/2023.    Review of Systems  Constitutional:  Negative for fever.  Respiratory:  Negative for cough, shortness of breath and wheezing.   Cardiovascular:  Negative for chest pain and leg swelling.  Gastrointestinal:  Negative for abdominal distention, abdominal pain, blood in stool and constipation.  Genitourinary:  Negative for dysuria.   Negative unless indicated in HPI.  There were no vitals filed for this visit. There is no height or weight on file to calculate BMI. BP Readings from Last 3 Encounters:  10/11/23 (!) 149/63  10/01/23 (!) 144/69  09/03/23 136/68   Wt Readings from Last 3 Encounters:  10/10/23 137 lb (62.1 kg)  09/27/23 137 lb (62.1 kg)  09/03/23 130 lb 12.8 oz (59.3 kg)   Physical Exam Constitutional:      Appearance: Normal appearance.  HENT:     Head: Normocephalic and atraumatic.  Cardiovascular:     Rate and Rhythm: Normal rate and regular rhythm.  Pulmonary:     Effort: Pulmonary effort is normal. No respiratory distress.     Breath sounds: Normal breath sounds. No wheezing.  Abdominal:     General: Bowel sounds are normal. There is no distension.     Tenderness: There is no abdominal tenderness. There is no guarding or rebound.     Comments:    Musculoskeletal:        General: No swelling.  Neurological:     Mental Status: She is alert. Mental status is at baseline.     Motor: No weakness.     Labs reviewed: Basic Metabolic Panel: Recent Labs    05/09/23 0000 08/20/23 0000  NA 139 140  K 4.3 4.1  CL 103 102  CO2 28* 25*  BUN 26* 24*  CREATININE 1.1 1.1  CALCIUM 8.9 9.1   Liver  Function Tests: Recent Labs    08/20/23 0000  AST 22  ALT 12  ALKPHOS 116  ALBUMIN 3.9   No results for input(s): "LIPASE", "AMYLASE" in the last 8760 hours. No results for input(s): "AMMONIA" in the last 8760 hours. CBC: Recent Labs    05/09/23 0000 08/20/23 0000  WBC 5.6 5.9  NEUTROABS  --  3,086.00  HGB 11.4* 11.8*  HCT 34* 36  PLT 280 347   Cardiac Enzymes: No results for input(s): "CKTOTAL", "CKMB", "CKMBINDEX", "TROPONINI" in the last 8760 hours. BNP: Invalid input(s): "POCBNP" Lab Results  Component Value Date   HGBA1C 5.6 06/08/2022   Lab Results  Component Value Date   TSH 1.62 08/20/2023   Lab Results  Component Value Date   VITAMINB12 404 12/31/2010   Lab Results  Component Value Date   FOLATE 12.6 05/11/2010   No results found for: "IRON", "TIBC", "FERRITIN"  Imaging and Procedures obtained prior to SNF admission: ECHOCARDIOGRAM COMPLETE Result Date: 06/08/2022    ECHOCARDIOGRAM REPORT   Patient Name:   Jean [name] Date of Exam: 06/08/2022 Medical Rec #:  [id number]      Height:  65.0 in Accession #:    [id number]     Weight:       151.7 lb Date of Birth:  [birth year]     BSA:          1.759 m Patient Age:    83 years       BP:           161/48 mmHg Patient Gender: F              HR:           86 bpm. Exam Location:  Inpatient Procedure: 2D Echo, Cardiac Doppler and Color Doppler Indications:    TIA  History:        Patient has no prior history of Echocardiogram examinations.                 Risk Factors:Dyslipidemia and Hypertension. PVD. CKD.  Sonographer:    Ross Ludwig RDCS (AE) Referring Phys: Jefferson Fuel  Sonographer Comments: Technically difficult study due to poor echo windows. IMPRESSIONS  1. Left ventricular ejection fraction, by estimation, is 55 to 60%. The left ventricle has normal function. Left ventricular endocardial border not optimally defined to evaluate regional wall motion. Left ventricular diastolic parameters are indeterminate.   2. Right ventricular systolic function is normal. The right ventricular size is normal.  3. The mitral valve was not well visualized. No evidence of mitral valve regurgitation. No evidence of mitral stenosis.  4. The aortic valve was not well visualized. Aortic valve regurgitation is not visualized. No aortic stenosis is present. Comparison(s): No prior Echocardiogram. Conclusion(s)/Recommendation(s): Very limited imaged due to poor echo windows. Grossly normal RV and LV function. No severe valve disease by Doppler, but valves not well visualized. Consider TEE if indicated to exclude cardioembolic source. FINDINGS  Left Ventricle: Left ventricular ejection fraction, by estimation, is 55 to 60%. The left ventricle has normal function. Left ventricular endocardial border not optimally defined to evaluate regional wall motion. The left ventricular internal cavity size was normal in size. There is borderline left ventricular hypertrophy. Left ventricular diastolic parameters are indeterminate. Right Ventricle: The right ventricular size is normal. Right vetricular wall thickness was not well visualized. Right ventricular systolic function is normal. Left Atrium: Left atrial size was normal in size. Right Atrium: Right atrial size was normal in size. Pericardium: There is no evidence of pericardial effusion. Presence of epicardial fat layer. Mitral Valve: The mitral valve was not well visualized. No evidence of mitral valve regurgitation. No evidence of mitral valve stenosis. Tricuspid Valve: The tricuspid valve is not well visualized. Tricuspid valve regurgitation is trivial. No evidence of tricuspid stenosis. Aortic Valve: The aortic valve was not well visualized. Aortic valve regurgitation is not visualized. No aortic stenosis is present. Aortic valve mean gradient measures 3.0 mmHg. Aortic valve peak gradient measures 3.9 mmHg. Aortic valve area, by VTI measures 3.60 cm. Pulmonic Valve: The pulmonic valve was not  well visualized. Pulmonic valve regurgitation is not visualized. Aorta: The aortic root and ascending aorta are structurally normal, with no evidence of dilitation. Venous: The inferior vena cava was not well visualized. IAS/Shunts: The interatrial septum was not well visualized.  LEFT VENTRICLE PLAX 2D LVIDd:         4.40 cm   Diastology LV PW:         1.30 cm   LV e' medial:    3.48 cm/s LV IVS:        1.10 cm   LV  E/e' medial:  16.3 LVOT diam:     2.30 cm   LV e' lateral:   7.72 cm/s LV SV:         74        LV E/e' lateral: 7.3 LV SV Index:   42 LVOT Area:     4.15 cm  RIGHT VENTRICLE RV Basal diam:  2.70 cm RV S prime:     12.80 cm/s TAPSE (M-mode): 1.4 cm LEFT ATRIUM             Index        RIGHT ATRIUM           Index LA diam:        4.20 cm 2.39 cm/m   RA Area:     12.60 cm LA Vol (A2C):   19.7 ml 11.20 ml/m  RA Volume:   29.70 ml  16.89 ml/m LA Vol (A4C):   38.9 ml 22.12 ml/m LA Biplane Vol: 30.7 ml 17.46 ml/m  AORTIC VALVE AV Area (Vmax):    3.35 cm AV Area (Vmean):   3.48 cm AV Area (VTI):     3.60 cm AV Vmax:           99.10 cm/s AV Vmean:          74.900 cm/s AV VTI:            0.204 m AV Peak Grad:      3.9 mmHg AV Mean Grad:      3.0 mmHg LVOT Vmax:         80.00 cm/s LVOT Vmean:        62.800 cm/s LVOT VTI:          0.177 m LVOT/AV VTI ratio: 0.87  AORTA Ao Root diam: 2.70 cm Ao Asc diam:  2.90 cm MITRAL VALVE MV Area (PHT): 3.19 cm    SHUNTS MV Decel Time: 238 msec    Systemic VTI:  0.18 m MV E velocity: 56.70 cm/s  Systemic Diam: 2.30 cm MV A velocity: 96.20 cm/s MV E/A ratio:  0.59 Jodelle Red MD Electronically signed by Jodelle Red MD Signature Date/Time: 06/08/2022/6:43:11 PM    Final    EEG adult Result Date: 06/08/2022 Charlsie Quest, MD     06/08/2022  5:14 AM Patient Name: FRANCESSCA FRIIS MRN: [id number] Epilepsy Attending: Charlsie Quest Referring Physician/Provider: Jefferson Fuel, MD Date: 06/08/2022 Duration: 21.48 mins Patient history: 83yo F with  ams. EEG to evaluate for seizure. Level of alertness: Awake AEDs during EEG study: Ativan, Librium Technical aspects: This EEG study was done with scalp electrodes positioned according to the 10-20 International system of electrode placement. Electrical activity was reviewed with band pass filter of 1-70Hz , sensitivity of 7 uV/mm, display speed of 18mm/sec with a 60Hz  notched filter applied as appropriate. EEG data were recorded continuously and digitally stored.  Video monitoring was available and reviewed as appropriate. Description: No posterior dominant rhythm was seen. There was an excessive amount of 15 to 18 Hz beta activity distributed symmetrically and diffusely.  Hyperventilation and photic stimulation were not performed.   ABNORMALITY - Excessive beta, generalized IMPRESSION: This study is within normal limits. The excessive beta activity seen in the background is most likely due to the effect of benzodiazepine and is a benign EEG pattern. No seizures or epileptiform discharges were seen throughout the recording. A normal interictal EEG does not exclude nor support the diagnosis of epilepsy. Charlsie Quest   MR  BRAIN W WO CONTRAST Result Date: 06/08/2022 CLINICAL DATA:  Transient ischemic attack.  New onset seizure. EXAM: MRI HEAD WITHOUT AND WITH CONTRAST MRA HEAD WITHOUT CONTRAST MRA NECK WITHOUT AND WITH CONTRAST TECHNIQUE: Multiplanar, multiecho pulse sequences of the brain and surrounding structures were obtained without and with intravenous contrast. Angiographic images of the Circle of Willis were obtained using MRA technique without intravenous contrast. Angiographic images of the neck were obtained using MRA technique without and with intravenous contrast. Carotid stenosis measurements (when applicable) are obtained utilizing NASCET criteria, using the distal internal carotid diameter as the denominator. CONTRAST:  6.5 mL gadopiclenol (Vueway) 0.5 mmol/ml solution COMPARISON:  None  Available. FINDINGS: MRI HEAD FINDINGS Brain: No acute infarct, mass effect or extra-axial collection. No acute or chronic hemorrhage. There is multifocal hyperintense T2-weighted signal within the white matter. Generalized volume loss. The midline structures are normal. There is no abnormal contrast enhancement. Vascular: Major flow voids are preserved. Skull and upper cervical spine: Normal calvarium and skull base. Visualized upper cervical spine and soft tissues are normal. Sinuses/Orbits:No paranasal sinus fluid levels or advanced mucosal thickening. No mastoid or middle ear effusion. Normal orbits. MRA HEAD FINDINGS POSTERIOR CIRCULATION: --Vertebral arteries: Normal --Inferior cerebellar arteries: Normal. --Basilar artery: Normal. --Superior cerebellar arteries: Normal. --Posterior cerebral arteries: Normal. ANTERIOR CIRCULATION: --Intracranial internal carotid arteries: Normal. --Anterior cerebral arteries (ACA): Normal. --Middle cerebral arteries (MCA): At the right MCA bifurcation, there is a laterally projecting outpouching that measures 2 mm. This may be a small aneurysm or a chronically occluded branch vessel. Otherwise, the middle cerebral arteries are normal. ANATOMIC VARIANTS: None MRA NECK FINDINGS Left dominant vertebral arteries are patent and of normal caliber. Both carotid systems are normal without stenosis. IMPRESSION: 1. No acute intracranial abnormality. 2. 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 3. No emergent large vessel occlusion or high-grade stenosis. 4. A 2 mm laterally projecting outpouching at the right MCA bifurcation may be a small aneurysm or an occluded branch vessel. Given the lack of acute ischemia, this is unlikely to be an acute occlusion. Electronically Signed   By: Deatra Robinson M.D.   On: 06/08/2022 01:42   MR ANGIO HEAD WO CONTRAST Result Date: 06/08/2022 CLINICAL DATA:  Transient ischemic attack.  New onset seizure. EXAM: MRI HEAD WITHOUT AND WITH CONTRAST  MRA HEAD WITHOUT CONTRAST MRA NECK WITHOUT AND WITH CONTRAST TECHNIQUE: Multiplanar, multiecho pulse sequences of the brain and surrounding structures were obtained without and with intravenous contrast. Angiographic images of the Circle of Willis were obtained using MRA technique without intravenous contrast. Angiographic images of the neck were obtained using MRA technique without and with intravenous contrast. Carotid stenosis measurements (when applicable) are obtained utilizing NASCET criteria, using the distal internal carotid diameter as the denominator. CONTRAST:  6.5 mL gadopiclenol (Vueway) 0.5 mmol/ml solution COMPARISON:  None Available. FINDINGS: MRI HEAD FINDINGS Brain: No acute infarct, mass effect or extra-axial collection. No acute or chronic hemorrhage. There is multifocal hyperintense T2-weighted signal within the white matter. Generalized volume loss. The midline structures are normal. There is no abnormal contrast enhancement. Vascular: Major flow voids are preserved. Skull and upper cervical spine: Normal calvarium and skull base. Visualized upper cervical spine and soft tissues are normal. Sinuses/Orbits:No paranasal sinus fluid levels or advanced mucosal thickening. No mastoid or middle ear effusion. Normal orbits. MRA HEAD FINDINGS POSTERIOR CIRCULATION: --Vertebral arteries: Normal --Inferior cerebellar arteries: Normal. --Basilar artery: Normal. --Superior cerebellar arteries: Normal. --Posterior cerebral arteries: Normal. ANTERIOR CIRCULATION: --Intracranial internal carotid arteries:  Normal. --Anterior cerebral arteries (ACA): Normal. --Middle cerebral arteries (MCA): At the right MCA bifurcation, there is a laterally projecting outpouching that measures 2 mm. This may be a small aneurysm or a chronically occluded branch vessel. Otherwise, the middle cerebral arteries are normal. ANATOMIC VARIANTS: None MRA NECK FINDINGS Left dominant vertebral arteries are patent and of normal caliber.  Both carotid systems are normal without stenosis. IMPRESSION: 1. No acute intracranial abnormality. 2. 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 3. No emergent large vessel occlusion or high-grade stenosis. 4. A 2 mm laterally projecting outpouching at the right MCA bifurcation may be a small aneurysm or an occluded branch vessel. Given the lack of acute ischemia, this is unlikely to be an acute occlusion. Electronically Signed   By: Deatra Robinson M.D.   On: 06/08/2022 01:42   MR ANGIO NECK W WO CONTRAST Result Date: 06/08/2022 CLINICAL DATA:  Transient ischemic attack.  New onset seizure. EXAM: MRI HEAD WITHOUT AND WITH CONTRAST MRA HEAD WITHOUT CONTRAST MRA NECK WITHOUT AND WITH CONTRAST TECHNIQUE: Multiplanar, multiecho pulse sequences of the brain and surrounding structures were obtained without and with intravenous contrast. Angiographic images of the Circle of Willis were obtained using MRA technique without intravenous contrast. Angiographic images of the neck were obtained using MRA technique without and with intravenous contrast. Carotid stenosis measurements (when applicable) are obtained utilizing NASCET criteria, using the distal internal carotid diameter as the denominator. CONTRAST:  6.5 mL gadopiclenol (Vueway) 0.5 mmol/ml solution COMPARISON:  None Available. FINDINGS: MRI HEAD FINDINGS Brain: No acute infarct, mass effect or extra-axial collection. No acute or chronic hemorrhage. There is multifocal hyperintense T2-weighted signal within the white matter. Generalized volume loss. The midline structures are normal. There is no abnormal contrast enhancement. Vascular: Major flow voids are preserved. Skull and upper cervical spine: Normal calvarium and skull base. Visualized upper cervical spine and soft tissues are normal. Sinuses/Orbits:No paranasal sinus fluid levels or advanced mucosal thickening. No mastoid or middle ear effusion. Normal orbits. MRA HEAD FINDINGS POSTERIOR CIRCULATION:  --Vertebral arteries: Normal --Inferior cerebellar arteries: Normal. --Basilar artery: Normal. --Superior cerebellar arteries: Normal. --Posterior cerebral arteries: Normal. ANTERIOR CIRCULATION: --Intracranial internal carotid arteries: Normal. --Anterior cerebral arteries (ACA): Normal. --Middle cerebral arteries (MCA): At the right MCA bifurcation, there is a laterally projecting outpouching that measures 2 mm. This may be a small aneurysm or a chronically occluded branch vessel. Otherwise, the middle cerebral arteries are normal. ANATOMIC VARIANTS: None MRA NECK FINDINGS Left dominant vertebral arteries are patent and of normal caliber. Both carotid systems are normal without stenosis. IMPRESSION: 1. No acute intracranial abnormality. 2. 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 3. No emergent large vessel occlusion or high-grade stenosis. 4. A 2 mm laterally projecting outpouching at the right MCA bifurcation may be a small aneurysm or an occluded branch vessel. Given the lack of acute ischemia, this is unlikely to be an acute occlusion. Electronically Signed   By: Deatra Robinson M.D.   On: 06/08/2022 01:42   CT HEAD CODE STROKE WO CONTRAST Result Date: 06/07/2022 CLINICAL DATA:  Code stroke.  Neuro deficit, acute, stroke suspected EXAM: CT HEAD WITHOUT CONTRAST TECHNIQUE: Contiguous axial images were obtained from the base of the skull through the vertex without intravenous contrast. RADIATION DOSE REDUCTION: This exam was performed according to the departmental dose-optimization program which includes automated exposure control, adjustment of the mA and/or kV according to patient size and/or use of iterative reconstruction technique. COMPARISON:  CT head March 10, 2021. FINDINGS: Brain: No evidence of acute large vascular territory infarction, hemorrhage, hydrocephalus, extra-axial collection or mass lesion/mass effect. Mild for age patchy white matter hypoattenuation, nonspecific but compatible  with chronic microvascular ischemic disease. Vascular: No hyperdense vessel identified. Calcific intracranial atherosclerosis. Skull: No acute fracture. Sinuses/Orbits: No acute findings. ASPECTS [facility] Stroke Program Early CT Score) total score (0-10 with 10 being normal): 10. IMPRESSION: 1. No evidence of acute intracranial abnormality. 2. ASPECTS is 10. Code stroke imaging results were communicated on 06/07/2022 at 4:52 pm to provider Dr. Selina Cooley Via secure text paging. Electronically Signed   By: Feliberto Harts M.D.   On: 06/07/2022 16:52    COMPREHENSIVE METABOLIC PANEL GLUCOSE 84 mg/dL 16-10 Final  Fasting reference interval UREA NITROGEN (BUN) 24 mg/dL 9-60 Final CREATININE 4.54 mg/dL 0.98-1.19 H Final EGFR 48 mL/min/1.73 m2 > OR = 60 L Final BUN/CREATININE RATIO 21 (calc) 6-22 Final SODIUM 140 mmol/L 135-146 Final POTASSIUM 4.1 mmol/L 3.5-5.3 Final CHLORIDE 102 mmol/L 98-110 Final CARBON DIOXIDE 25 mmol/L 20-32 Final CALCIUM 9.1 mg/dL 1.4-78.2 Final PROTEIN, TOTAL 6.4 g/dL 9.5-6.2 Final ALBUMIN 3.9 g/dL 1.3-0.8 Final GLOBULIN 2.5 g/dL (calc) 6.5-7.8 Final ALBUMIN/GLOBULIN RATIO 1.6 (calc) 1.0-2.5 Final BILIRUBIN, TOTAL 0.4 mg/dL 4.6-9.6 Final ALKALINE PHOSPHATASE 116 U/L 37-153 Final AST 22 U/L 10-35 Final ALT 12 U/L 6-29 Final   CBC (INCLUDES DIFF/PLT) WHITE BLOOD CELL COUNT 5.9 Thousand/u L 3.8-10.8 Final RED BLOOD CELL COUNT 3.82 Million/uL 3.80-5.10 Final HEMOGLOBIN 11.8 g/dL 29.5-28.4 Final HEMATOCRIT 36.2 % 35.0-45.0 Final MCV 94.8 fL 80.0-100.0 Final MCH 30.9 pg 27.0-33.0 Final MCHC 32.6 g/dL 13.2-44.0 Final For adults, a slight decrease in the calculated MCHC value (in the range of 30 to 32 g/dL) is most likely 1 of 4 [facility] Lab Results Report Laboratory: 08/20/2023 14:23 COMPREHENSIVE METABOLIC PANEL / CBC (INCLUDES DIFF/PLT) / TSH / Clinical PDF Report [id number] Reviewed By ehiatt on 08/20/2023 17:01 Latest Version Source  [id number] Result Unit Ref. Range Flag Status Caly, Pellum [phone]) Order #: [id number] SourceKey: [id number] MCHC 32.6 g/dL 16.6-06.3 Final not clinically significant; however, it should be interpreted with caution in correlation with other red cell parameters and the patient's clinical condition. RDW 12.3 % 11.0-15.0 Final   Assessment and Plan      1. Major neurocognitive disorder (HCC) (Primary) Cont with supportive care Assistance with ADLS   2. 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 Allergic rhinitis, unspecified seasonality, unspecified trigger Cont with claritin  4. Constipation, unspecified constipation type Cont with bowel regimen  5. Swelling of lower extremity No swelling noted today  Cont with spironolactone       30 min Total time spent for obtaining history,  performing a medically appropriate examination and evaluation, reviewing the tests,  documenting clinical information in the electronic or other health record,   ,care coordination (not separately reported)

## 2023-12-03 ENCOUNTER — Encounter: Payer: Self-pay | Admitting: Nurse Practitioner

## 2023-12-03 ENCOUNTER — Non-Acute Institutional Stay (SKILLED_NURSING_FACILITY): Payer: Self-pay | Admitting: Nurse Practitioner

## 2023-12-03 DIAGNOSIS — I10 Essential (primary) hypertension: Secondary | ICD-10-CM | POA: Diagnosis not present

## 2023-12-03 DIAGNOSIS — K219 Gastro-esophageal reflux disease without esophagitis: Secondary | ICD-10-CM | POA: Diagnosis not present

## 2023-12-03 DIAGNOSIS — M8000XA Age-related osteoporosis with current pathological fracture, unspecified site, initial encounter for fracture: Secondary | ICD-10-CM

## 2023-12-03 DIAGNOSIS — F323 Major depressive disorder, single episode, severe with psychotic features: Secondary | ICD-10-CM

## 2023-12-03 DIAGNOSIS — K5909 Other constipation: Secondary | ICD-10-CM

## 2023-12-03 DIAGNOSIS — M159 Polyosteoarthritis, unspecified: Secondary | ICD-10-CM | POA: Diagnosis not present

## 2023-12-03 DIAGNOSIS — F0390 Unspecified dementia without behavioral disturbance: Secondary | ICD-10-CM

## 2023-12-03 NOTE — Assessment & Plan Note (Signed)
supportive care in memory care unit [facility] since Flu 08/2021. CT head was negative for acute process. Taking Memantine. MMSE 21/30 10/13/22 

## 2023-12-03 NOTE — Assessment & Plan Note (Signed)
 Depression/anxiety/hallucination/delusions/false believes, at her baseline, resumed Amitriptyline at 50mg -failed GDR, off Librium, on Fluphenazine, Clonazepam, TSH 1.62 08/20/23

## 2023-12-03 NOTE — Assessment & Plan Note (Signed)
on Tylenol, ambulates with walker 

## 2023-12-03 NOTE — Assessment & Plan Note (Signed)
 Stable, on prn MOM, prn Bisacodyl, taking MiraLax and Senna daily.

## 2023-12-03 NOTE — Progress Notes (Unsigned)
 Location:   [facility]  Remijio Holleran Link Snuffer Elveria Lauderbaugh NP  [name], MD  [name], MD as PCP - General (Internal Medicine) [name], MD (Ophthalmology) [name], MD (Psychiatry) Jenetta Wease X, NP as Nurse Practitioner (Nurse Practitioner) [name], MD as Consulting Physician (Orthopedic Surgery)  Extended Emergency Contact Information Primary Emergency Contact: [name], [place] Work Phone: [phone] Relation: Son Secondary Emergency Contact: [name] Home Phone: [phone] Relation: Sister  Code Status:  DNR Goals of care: Advanced Directive information    09/03/2023   11:03 AM  Advanced Directives  Does Patient Have a Medical Advance Directive? Yes  Type of Estate agent of [place];Living will;Out of facility DNR (pink MOST or yellow form)  Does patient want to make changes to medical advance directive? No - Patient declined  Copy of Healthcare Power of Attorney in Chart? Yes - validated most recent copy scanned in chart (See row information)  Pre-existing out of facility DNR order (yellow form or pink MOST form) Pink MOST/Yellow Form most recent copy in chart - Physician notified to receive inpatient order     Chief Complaint  Patient presents with   Medical Management of Chronic Issues    HPI:  Pt is a 83 y.o. female seen today for medical management of chronic diseases.      HTN, blood pressure is controlled on diuretics. Bun/creat 24/1.1 08/20/23             OA on Tylenol, ambulates with walker.              GERD, stable, on  Maalox qhs, Hgb 11.8 08/20/23             Depression/anxiety/hallucination/delusions/false believes, at her baseline, resumed Amitriptyline at 50mg -failed GDR, off Librium, on Fluphenazine, Clonazepam, TSH 1.62 08/20/23             Dementia, supportive care in memory care unit [facility] since Flu 08/2021. CT head was  negative for acute process. Taking Memantine. MMSE 21/30 10/13/22             Chronic constipation, on prn MOM, prn Bisacodyl, taking MiraLax and Senna daily.              Allergic rhinitis, takes Claritin       OP, stopped taking Prolia due to cost, ? Allergic to Fosamax.  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, 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   Vitamin D deficiency  Past Surgical History:  Procedure Laterality Date   ABDOMINAL HYSTERECTOMY     ANAL RECTAL MANOMETRY N/A 07/05/2014   Procedure: ANO RECTAL MANOMETRY;  Surgeon: Romie Levee, MD;  Location: [facility];  Service: Endoscopy;  Laterality: N/A;   APPENDECTOMY     CAROTID ENDARTERECTOMY     COLONOSCOPY  2008, 2015   TONSILLECTOMY     UPPER GASTROINTESTINAL ENDOSCOPY  2002    Allergies  Allergen Reactions   Biaxin [Clarithromycin]    Clarithromycin    Doxycycline    Fosamax [Alendronate Sodium]    Geodon [Ziprasidone Hydrochloride]    Lipitor [Atorvastatin Calcium]    Penicillins    Pravachol [Pravastatin Sodium]    Statins    Sulfonamide Derivatives    Zithromax [Azithromycin]    Zocor [Simvastatin]     Allergies as of 12/03/2023       Reactions   Biaxin [clarithromycin]    Clarithromycin    Doxycycline    Fosamax [alendronate  Sodium]    Geodon [ziprasidone Hydrochloride]    Lipitor [atorvastatin Calcium]    Penicillins    Pravachol [pravastatin Sodium]    Statins    Sulfonamide Derivatives    Zithromax [azithromycin]    Zocor [simvastatin]         Medication List        Accurate as of December 03, 2023 11:59 PM. If you have any questions, ask your nurse or doctor.          acetaminophen 500 MG tablet Commonly known as: TYLENOL Take 1,000 mg by mouth every 8 (eight) hours as needed for fever, headache or mild pain. Give 500 mg by mouth three times a day related to PAIN, UNSPECIFIED   amitriptyline 50 MG tablet Commonly known as: ELAVIL Take 50 mg by mouth daily.   Artificial Tears ophthalmic solution Place 1 drop into both eyes 3 (three) times daily. 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   bisacodyl 10 MG suppository Commonly known as: DULCOLAX Place 10 mg rectally daily as needed for moderate constipation.   bisacodyl 5 MG EC tablet Commonly known as: DULCOLAX Take 5 mg by mouth daily as needed for moderate constipation.   carbamide peroxide 6.5 % OTIC solution Commonly known as: DEBROX 5 drops as needed (impaction). 5 drops to affected ear, otic (ear). At bedtime, PRN, Identify cerumen impaction by direct visualization by use of otoscope. Place 5 drops to affected ear QHS x3 nights then irrigate with bulb syringe. If not effective notify MD.   clonazePAM 0.25 MG disintegrating tablet Commonly known as: KLONOPIN Take 1 tablet (0.25 mg total) by mouth 2 (two) times daily. Morning and evening   Daily Probiotic Supplement 250 MG capsule Generic drug: saccharomyces boulardii Take 250 mg by mouth daily.   diclofenac Sodium 1 % Gel Commonly known as: VOLTAREN Apply 2 g topically 2 (two) times daily as needed (pain). apply to lower back area   fluPHENAZine 1 MG tablet Commonly known as: PROLIXIN Take 1 mg by  mouth at bedtime.   furosemide 20 MG tablet Commonly known as: LASIX Take 20 mg by mouth daily.   Geri-Lanta 200-200-20 MG/5ML suspension Generic drug: alum & mag hydroxide-simeth Take 30 mLs by mouth at bedtime. And every 4 hours as needed   guaiFENesin 600 MG 12 hr tablet Commonly known as: MUCINEX Take 600 mg by mouth daily as needed (Cold or cogestion).  hydrocortisone valerate cream 0.2 % Commonly known as: WESTCORT Apply to rash on face and neck topically every 12 hours as needed for Rash related to Rash and other nonspecific skin eruption   ketoconazole 2 % shampoo Commonly known as: NIZORAL Apply 1 application. topically 2 (two) times a week. Tuesday and Friday   ketoconazole 2 % cream Commonly known as: NIZORAL Apply 1 application  topically 2 (two) times daily. APPLY TO RASH ON FACE AND NECK TWICE DAILY AS NEEDED   loratadine 10 MG tablet Commonly known as: CLARITIN Take 10 mg by mouth daily.   magnesium hydroxide 400 MG/5ML suspension Commonly known as: MILK OF MAGNESIA Take 30 mLs by mouth daily as needed for mild constipation.   memantine 10 MG tablet Commonly known as: NAMENDA Take 10 mg by mouth 2 (two) times daily.   MIRALAX PO Take 17 g by mouth See admin instructions. Take 17 gm, oral twice a day, every other day. Miralax 17 gm + 4-8oz fluid mizture BID   OYSTER SHELL PO Take 250 mg by mouth in the morning and at bedtime.   SENNOSIDES PO Take 1 tablet by mouth at bedtime.   Sentry Senior Tabs Take 1 tablet by mouth daily. 500-300-250 mcg [DX: Vitamin deficiency, unspecified]   spironolactone 25 MG tablet Commonly known as: ALDACTONE Take 12.5 mg by mouth daily.   triamcinolone cream 0.1 % Commonly known as: KENALOG Apply 1 application  topically as needed (rash). Apply BID as needed to rash to chest, behind L ear, left posterior neck.        Review of Systems  Constitutional:  Negative for appetite change, fatigue and fever.  HENT:   Positive for hearing loss. Negative for congestion, rhinorrhea and voice change.   Eyes:  Positive for visual disturbance.       Low vision  Respiratory:  Negative for cough and shortness of breath.   Cardiovascular:  Negative for leg swelling.  Gastrointestinal:  Negative for abdominal pain and constipation.  Genitourinary:  Negative for dysuria, frequency and urgency.  Musculoskeletal:  Positive for arthralgias and gait problem.  Skin:  Negative for color change.  Neurological:  Negative for speech difficulty, weakness and light-headedness.       Memory lapses  Psychiatric/Behavioral:  Positive for confusion. Negative for behavioral problems, hallucinations and sleep disturbance. The patient is not nervous/anxious.        Delusion    Immunization History  Administered Date(s) Administered   Fluad Quad(high Dose 65+) 06/26/2023   Influenza Split 07/23/2013   Influenza Whole 07/05/2009, 06/26/2018   Influenza-Unspecified 07/29/2014, 06/14/2015, 07/11/2016, 07/15/2017, 06/27/2019, 07/05/2020, 07/13/2021, 07/21/2022   Moderna Covid-19 Fall Seasonal Vaccine 17yrs & older 07/10/2023   Moderna SARS-COV2 Booster Vaccination 02/09/2022   Moderna Sars-Covid-2 Vaccination 09/26/2019, 10/24/2019, 08/02/2020   PFIZER(Purple Top)SARS-COV-2 Vaccination 09/26/2019   PPD Test 06/17/2013   Pneumococcal Conjugate-13 07/22/2017   Pneumococcal Polysaccharide-23 05/05/2009   Respiratory Syncytial Virus Vaccine,Recomb Aduvanted(Arexvy) 10/12/2022   Td 05/05/2009, 07/16/2019   Unspecified SARS-COV-2 Vaccination 02/21/2021, 06/13/2021   Zoster Recombinant(Shingrix) 09/27/2021, 12/05/2021   Zoster, Live 01/09/2011   Pertinent  Health Maintenance Due  Topic Date Due   INFLUENZA VACCINE  Completed   DEXA SCAN  Completed      06/09/2022   10:00 AM 11/23/2022   11:33 AM 12/17/2022    3:49 PM 02/15/2023    2:45 PM 03/20/2023   12:00 PM  Fall Risk  Falls in the past year?  1 0 0 0  Was  there an injury  with Fall?  1 0 0 0  Fall Risk Category Calculator  2 0 0 0  (RETIRED) Patient Fall Risk Level High fall risk      Patient at Risk for Falls Due to  History of fall(s);Impaired balance/gait History of fall(s);Impaired balance/gait History of fall(s);Impaired balance/gait History of fall(s)  Fall risk Follow up  Falls evaluation completed Falls evaluation completed Falls evaluation completed Falls evaluation completed;Education provided;Falls prevention discussed   Functional Status Survey:    Vitals:   12/03/23 1416  BP: 117/82  Pulse: 66  Resp: 18  Temp: 97.9 F (36.6 C)  SpO2: 96%  Weight: 131 lb 12.8 oz (59.8 kg)   Body mass index is 21.93 kg/m. Physical Exam Constitutional:      Appearance: Normal appearance.  HENT:     Head: Normocephalic.     Nose: Nose normal.     Mouth/Throat:     Mouth: Mucous membranes are moist.  Eyes:     Extraocular Movements: Extraocular movements intact.     Conjunctiva/sclera: Conjunctivae normal.     Pupils: Pupils are equal, round, and reactive to light.     Comments: Legally blind  Cardiovascular:     Rate and Rhythm: Normal rate and regular rhythm.     Heart sounds: No murmur heard.    Comments: DP pulses R+L present.  Pulmonary:     Effort: Pulmonary effort is normal.     Breath sounds: No rales.  Abdominal:     General: Bowel sounds are normal.     Palpations: Abdomen is soft.     Tenderness: There is no abdominal tenderness.  Genitourinary:    Comments: From previous examination a pinto bean sized cyst like lump palpated near the clitoris, asymptomatic.  Musculoskeletal:     Cervical back: Normal range of motion and neck supple.     Right lower leg: No edema.     Left lower leg: No edema.  Skin:    General: Skin is warm and dry.  Neurological:     General: No focal deficit present.     Mental Status: She is alert. Mental status is at baseline.     Gait: Gait abnormal.     Comments: Oriented to person, place.    Psychiatric:        Mood and Affect: Mood normal.        Behavior: Behavior normal.        Thought Content: Thought content normal.     Comments: The patient appears at her baseline mentation during my examination.      Labs reviewed: Recent Labs    05/09/23 0000 08/20/23 0000  NA 139 140  K 4.3 4.1  CL 103 102  CO2 28* 25*  BUN 26* 24*  CREATININE 1.1 1.1  CALCIUM 8.9 9.1   Recent Labs    08/20/23 0000  AST 22  ALT 12  ALKPHOS 116  ALBUMIN 3.9   Recent Labs    05/09/23 0000 08/20/23 0000  WBC 5.6 5.9  NEUTROABS  --  3,086.00  HGB 11.4* 11.8*  HCT 34* 36  PLT 280 347   Lab Results  Component Value Date   TSH 1.62 08/20/2023   Lab Results  Component Value Date   HGBA1C 5.6 06/08/2022   Lab Results  Component Value Date   CHOL 201 (H) 06/08/2022   HDL 71 06/08/2022   LDLCALC 113 (H) 06/08/2022   LDLDIRECT 190.0 03/24/2012   TRIG 84  06/08/2022   CHOLHDL 2.8 06/08/2022    Significant Diagnostic Results in last 30 days:  No results found.  Assessment/Plan  Essential hypertension blood pressure is controlled on diuretics. 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   GERD (gastroesophageal reflux disease) stable, on  Maalox qhs, Hgb 11.8 08/20/23  Depression, psychotic (HCC) Depression/anxiety/hallucination/delusions/false believes, at her baseline, resumed Amitriptyline at 50mg -failed GDR, off Librium, on Fluphenazine, Clonazepam, TSH 1.62 08/20/23  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HCC) supportive care in memory care unit [facility] since Flu 08/2021. CT head was negative for acute process. Taking Memantine. MMSE 21/30 10/13/22  Constipation Stable, on prn MOM, prn Bisacodyl, taking MiraLax and Senna daily.   Osteoporosis stopped taking Prolia due to cost, ? Allergic to Fosamax.  her son declined DEXA   Family/ staff Communication: plan of care reviewed with the patient and charge  nurse.   Labs/tests ordered:  none

## 2023-12-03 NOTE — Assessment & Plan Note (Signed)
 stable, on  Maalox qhs, Hgb 11.8 08/20/23

## 2023-12-03 NOTE — Assessment & Plan Note (Signed)
 blood pressure is controlled on diuretics. Bun/creat 24/1.1 08/20/23

## 2023-12-03 NOTE — Assessment & Plan Note (Signed)
stopped taking Prolia due to cost, ? Allergic to Fosamax.  her son declined DEXA 

## 2023-12-31 ENCOUNTER — Non-Acute Institutional Stay (SKILLED_NURSING_FACILITY): Payer: Self-pay | Admitting: Nurse Practitioner

## 2023-12-31 ENCOUNTER — Encounter: Payer: Self-pay | Admitting: Nurse Practitioner

## 2023-12-31 DIAGNOSIS — K219 Gastro-esophageal reflux disease without esophagitis: Secondary | ICD-10-CM | POA: Diagnosis not present

## 2023-12-31 DIAGNOSIS — M159 Polyosteoarthritis, unspecified: Secondary | ICD-10-CM

## 2023-12-31 DIAGNOSIS — F323 Major depressive disorder, single episode, severe with psychotic features: Secondary | ICD-10-CM | POA: Diagnosis not present

## 2023-12-31 DIAGNOSIS — F0390 Unspecified dementia without behavioral disturbance: Secondary | ICD-10-CM | POA: Diagnosis not present

## 2023-12-31 DIAGNOSIS — K5909 Other constipation: Secondary | ICD-10-CM

## 2023-12-31 DIAGNOSIS — I10 Essential (primary) hypertension: Secondary | ICD-10-CM | POA: Diagnosis not present

## 2023-12-31 NOTE — Progress Notes (Unsigned)
 Location:   SNF [facility] Room Number: 101 Place of Service:  SNF (31) Provider: [facility] Keanen Dohse NP  [name], MD  [name], MD as PCP - General (Internal Medicine) [name], MD (Ophthalmology) [name], MD (Psychiatry) Carylon Tamburro X, NP as Nurse Practitioner (Nurse Practitioner) [name], MD as Consulting Physician (Orthopedic Surgery)  Extended Emergency Contact Information Primary Emergency Contact: [name], [place] Work Phone: [phone] Relation: Son Secondary Emergency Contact: [name] Home Phone: [phone] Relation: Sister  Code Status:  DNR Goals of care: Advanced Directive information    09/03/2023   11:03 AM  Advanced Directives  Does Patient Have a Medical Advance Directive? Yes  Type of Estate agent of [place];Living will;Out of facility DNR (pink MOST or yellow form)  Does patient want to make changes to medical advance directive? No - Patient declined  Copy of Healthcare Power of Attorney in Chart? Yes - validated most recent copy scanned in chart (See row information)  Pre-existing out of facility DNR order (yellow form or pink MOST form) Pink MOST/Yellow Form most recent copy in chart - Physician notified to receive inpatient order     Chief Complaint  Patient presents with  . Medical Management of Chronic Issues    HPI:  Pt is a 83 y.o. female seen today for medical management of chronic diseases.    HTN, blood pressure is controlled on diuretics. Bun/creat 24/1.1 08/20/23             OA on Tylenol, ambulates with walker.              GERD, stable, on  Maalox qhs, Hgb 11.8 08/20/23             Depression/anxiety/hallucination/delusions/false believes, at her baseline, resumed Amitriptyline at 50mg -failed GDR, off Librium, on Fluphenazine, Clonazepam, TSH 1.62 08/20/23             Dementia,  supportive care in memory care unit [facility] since Flu 08/2021. CT head was negative for acute process. Taking Memantine. MMSE 21/30 10/13/22             Chronic constipation, on prn MOM, prn Bisacodyl, taking MiraLax and Senna daily.              Allergic rhinitis, takes Claritin       OP, stopped taking Prolia due to cost, ? Allergic to Fosamax.  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    Past Medical History:  Diagnosis Date  . Allergy   . Anemia   . Anxiety   . Asthma   . CAD (coronary artery disease)   . Depression   . Diverticulosis   . Dysfunction of eustachian tube   . Elevated LFTs   . Esophageal reflux   . Esophageal stricture 2002  . Hemorrhoids   . History of rectal polyps   . HLD (hyperlipidemia)   . HTN (hypertension)   . Hypertonicity of bladder   . Irritable bowel syndrome with constipation   . Obesity   . Optic neuritis   . Osteoporosis   . Peptic ulcer disease   . Peripheral neuropathy   . Trigeminal neuralgia    prior injury  . 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 Vitamin D deficiency  Past Surgical History:  Procedure Laterality Date  . ABDOMINAL HYSTERECTOMY    . ANAL RECTAL MANOMETRY N/A 07/05/2014   Procedure: ANO RECTAL MANOMETRY;  Surgeon: Romie Levee, MD;  Location: [facility];  Service: Endoscopy;  Laterality: N/A;  . APPENDECTOMY    . CAROTID ENDARTERECTOMY    . COLONOSCOPY  2008, 2015  . TONSILLECTOMY    . UPPER GASTROINTESTINAL ENDOSCOPY  2002    Allergies  Allergen Reactions  . Biaxin [Clarithromycin]   . Clarithromycin   . Doxycycline   . Fosamax [Alendronate Sodium]   . Geodon [Ziprasidone Hydrochloride]   . Lipitor [Atorvastatin Calcium]   . Penicillins   . Pravachol [Pravastatin Sodium]   . Statins   . Sulfonamide Derivatives   . Zithromax [Azithromycin]   . Zocor [Simvastatin]     Allergies as of 12/31/2023        Reactions   Biaxin [clarithromycin]    Clarithromycin    Doxycycline    Fosamax [alendronate Sodium]    Geodon [ziprasidone Hydrochloride]    Lipitor [atorvastatin Calcium]    Penicillins    Pravachol [pravastatin Sodium]    Statins    Sulfonamide Derivatives    Zithromax [azithromycin]    Zocor [simvastatin]         Medication List        Accurate as of December 31, 2023 12:38 PM. If you have any questions, ask your nurse or doctor.          acetaminophen 500 MG tablet Commonly known as: TYLENOL Take 1,000 mg by mouth every 8 (eight) hours as needed for fever, headache or mild pain. Give 500 mg by mouth three times a day related to PAIN, UNSPECIFIED   amitriptyline 50 MG tablet Commonly known as: ELAVIL Take 50 mg by mouth daily.   Artificial Tears ophthalmic solution Place 1 drop into both eyes 3 (three) times daily. 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   bisacodyl 10 MG suppository Commonly known as: DULCOLAX Place 10 mg rectally daily as needed for moderate constipation.   bisacodyl 5 MG EC tablet Commonly known as: DULCOLAX Take 5 mg by mouth daily as needed for moderate constipation.   carbamide peroxide 6.5 % OTIC solution Commonly known as: DEBROX 5 drops as needed (impaction). 5 drops to affected ear, otic (ear). At bedtime, PRN, Identify cerumen impaction by direct visualization by use of otoscope. Place 5 drops to affected ear QHS x3 nights then irrigate with bulb syringe. If not effective notify MD.   clonazePAM 0.25 MG disintegrating tablet Commonly known as: KLONOPIN Take 1 tablet (0.25 mg total) by mouth 2 (two) times daily. Morning and evening   Daily Probiotic Supplement 250 MG capsule Generic drug: saccharomyces boulardii Take 250 mg by mouth daily.   diclofenac Sodium 1 % Gel Commonly known as: VOLTAREN Apply 2 g topically 2 (two) times daily as needed  (pain). apply to lower back area   fluPHENAZine 1 MG tablet Commonly known as: PROLIXIN Take 1 mg by mouth at bedtime.   furosemide 20 MG tablet Commonly known as: LASIX Take 20 mg by mouth daily.   Geri-Lanta 200-200-20 MG/5ML suspension Generic drug: alum & mag hydroxide-simeth Take 30 mLs by mouth at bedtime. And every 4 hours as needed   guaiFENesin 600 MG 12 hr tablet Commonly known as: MUCINEX Take 600 mg by mouth daily as needed (Cold or cogestion).  hydrocortisone valerate cream 0.2 % Commonly known as: WESTCORT Apply to rash on face and neck topically every 12 hours as needed for Rash related to Rash and other nonspecific skin eruption   ketoconazole 2 % shampoo Commonly known as: NIZORAL Apply 1 application. topically 2 (two) times a week. Tuesday and Friday   ketoconazole 2 % cream Commonly known as: NIZORAL Apply 1 application  topically 2 (two) times daily. APPLY TO RASH ON FACE AND NECK TWICE DAILY AS NEEDED   loratadine 10 MG tablet Commonly known as: CLARITIN Take 10 mg by mouth daily.   magnesium hydroxide 400 MG/5ML suspension Commonly known as: MILK OF MAGNESIA Take 30 mLs by mouth daily as needed for mild constipation.   memantine 10 MG tablet Commonly known as: NAMENDA Take 10 mg by mouth 2 (two) times daily.   MIRALAX PO Take 17 g by mouth See admin instructions. Take 17 gm, oral twice a day, every other day. Miralax 17 gm + 4-8oz fluid mizture BID   OYSTER SHELL PO Take 250 mg by mouth in the morning and at bedtime.   SENNOSIDES PO Take 1 tablet by mouth at bedtime.   Sentry Senior Tabs Take 1 tablet by mouth daily. 500-300-250 mcg [DX: Vitamin deficiency, unspecified]   spironolactone 25 MG tablet Commonly known as: ALDACTONE Take 12.5 mg by mouth daily.   triamcinolone cream 0.1 % Commonly known as: KENALOG Apply 1 application  topically as needed (rash). Apply BID as needed to rash to chest, behind L ear, left posterior neck.         Review of Systems  Constitutional:  Negative for appetite change, fatigue and fever.  HENT:  Positive for hearing loss. Negative for congestion, rhinorrhea and voice change.   Eyes:  Positive for visual disturbance.       Low vision  Respiratory:  Negative for cough and shortness of breath.   Cardiovascular:  Negative for leg swelling.  Gastrointestinal:  Negative for abdominal pain and constipation.  Genitourinary:  Negative for dysuria, frequency and urgency.  Musculoskeletal:  Positive for arthralgias and gait problem.  Skin:  Negative for color change.  Neurological:  Negative for speech difficulty, weakness and light-headedness.       Memory lapses  Psychiatric/Behavioral:  Positive for confusion. Negative for behavioral problems, hallucinations and sleep disturbance. The patient is not nervous/anxious.        Delusion    Immunization History  Administered Date(s) Administered  . Fluad Quad(high Dose 65+) 06/26/2023  . Influenza Split 07/23/2013  . Influenza Whole 07/05/2009, 06/26/2018  . Influenza-Unspecified 07/29/2014, 06/14/2015, 07/11/2016, 07/15/2017, 06/27/2019, 07/05/2020, 07/13/2021, 07/21/2022  . Moderna Covid-19 Fall Seasonal Vaccine 88yrs & older 07/10/2023  . Moderna SARS-COV2 Booster Vaccination 02/09/2022  . Moderna Sars-Covid-2 Vaccination 09/26/2019, 10/24/2019, 08/02/2020  . PFIZER(Purple Top)SARS-COV-2 Vaccination 09/26/2019  . PPD Test 06/17/2013  . Pneumococcal Conjugate-13 07/22/2017  . Pneumococcal Polysaccharide-23 05/05/2009  . Respiratory Syncytial Virus Vaccine,Recomb Aduvanted(Arexvy) 10/12/2022  . Td 05/05/2009, 07/16/2019  . Unspecified SARS-COV-2 Vaccination 02/21/2021, 06/13/2021  . Zoster Recombinant(Shingrix) 09/27/2021, 12/05/2021  . Zoster, Live 01/09/2011   Pertinent  Health Maintenance Due  Topic Date Due  . INFLUENZA VACCINE  04/24/2024  . DEXA SCAN  Completed      06/09/2022   10:00 AM 11/23/2022   11:33 AM 12/17/2022     3:49 PM 02/15/2023    2:45 PM 03/20/2023   12:00 PM  Fall Risk  Falls in the past year?  1 0 0 0  Was there an injury with Fall?  1 0 0 0  Fall Risk Category Calculator  2 0 0 0  (RETIRED) Patient Fall Risk Level High fall risk      Patient at Risk for Falls Due to  History of fall(s);Impaired balance/gait History of fall(s);Impaired balance/gait History of fall(s);Impaired balance/gait History of fall(s)  Fall risk Follow up  Falls evaluation completed Falls evaluation completed Falls evaluation completed Falls evaluation completed;Education provided;Falls prevention discussed   Functional Status Survey:    Vitals:   12/31/23 1232  BP: 119/64  Pulse: 69  Resp: 20  Temp: 98 F (36.7 C)  SpO2: 97%  Weight: 132 lb 3.2 oz (60 kg)   Body mass index is 22 kg/m. Physical Exam Constitutional:      Appearance: Normal appearance.  HENT:     Head: Normocephalic.     Nose: Nose normal.     Mouth/Throat:     Mouth: Mucous membranes are moist.  Eyes:     Extraocular Movements: Extraocular movements intact.     Conjunctiva/sclera: Conjunctivae normal.     Pupils: Pupils are equal, round, and reactive to light.     Comments: Legally blind  Cardiovascular:     Rate and Rhythm: Normal rate and regular rhythm.     Heart sounds: No murmur heard.    Comments: DP pulses R+L present.  Pulmonary:     Effort: Pulmonary effort is normal.     Breath sounds: No rales.  Abdominal:     General: Bowel sounds are normal.     Palpations: Abdomen is soft.     Tenderness: There is no abdominal tenderness.  Genitourinary:    Comments: From previous examination a pinto bean sized cyst like lump palpated near the clitoris, asymptomatic.  Musculoskeletal:     Cervical back: Normal range of motion and neck supple.     Right lower leg: No edema.     Left lower leg: No edema.  Skin:    General: Skin is warm and dry.  Neurological:     General: No focal deficit present.     Mental Status: She is  alert. Mental status is at baseline.     Gait: Gait abnormal.     Comments: Oriented to person, place.   Psychiatric:        Mood and Affect: Mood normal.        Behavior: Behavior normal.        Thought Content: Thought content normal.     Comments: The patient appears at her baseline mentation during my examination.     Labs reviewed: Recent Labs    05/09/23 0000 08/20/23 0000  NA 139 140  K 4.3 4.1  CL 103 102  CO2 28* 25*  BUN 26* 24*  CREATININE 1.1 1.1  CALCIUM 8.9 9.1   Recent Labs    08/20/23 0000  AST 22  ALT 12  ALKPHOS 116  ALBUMIN 3.9   Recent Labs    05/09/23 0000 08/20/23 0000  WBC 5.6 5.9  NEUTROABS  --  3,086.00  HGB 11.4* 11.8*  HCT 34* 36  PLT 280 347   Lab Results  Component Value Date   TSH 1.62 08/20/2023   Lab Results  Component Value Date   HGBA1C 5.6 06/08/2022   Lab Results  Component Value Date   CHOL 201 (H) 06/08/2022   HDL 71 06/08/2022   LDLCALC 113 (H) 06/08/2022   LDLDIRECT 190.0 03/24/2012   TRIG 84 06/08/2022  CHOLHDL 2.8 06/08/2022    Significant Diagnostic Results in last 30 days:  No results found.  Assessment/Plan  GERD (gastroesophageal reflux disease) stable, on  Maalox qhs, Hgb 11.8 08/20/23  Depression, psychotic (HCC) Depression/anxiety/hallucination/delusions/false believes, at her baseline, resumed Amitriptyline at 50mg -failed GDR, off Librium, on Fluphenazine, Clonazepam, TSH 1.62 08/20/23  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HCC) supportive care in memory care unit [facility] since Flu 08/2021. CT head was negative for acute process. Taking Memantine. MMSE 21/30 10/13/22  Essential hypertension blood pressure is controlled on diuretics. 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   Constipation on prn MOM, prn Bisacodyl, taking MiraLax and Senna daily.    Family/ staff Communication: plan of care reviewed with the patient and charge  nurse.   Labs/tests ordered:  none

## 2023-12-31 NOTE — Assessment & Plan Note (Signed)
 Depression/anxiety/hallucination/delusions/false believes, at her baseline, resumed Amitriptyline at 50mg -failed GDR, off Librium, on Fluphenazine, Clonazepam, TSH 1.62 08/20/23

## 2023-12-31 NOTE — Assessment & Plan Note (Signed)
 on prn MOM, prn Bisacodyl, taking MiraLax and Senna daily.

## 2023-12-31 NOTE — Assessment & Plan Note (Signed)
on Tylenol, ambulates with walker 

## 2023-12-31 NOTE — Assessment & Plan Note (Signed)
 stable, on  Maalox qhs, Hgb 11.8 08/20/23

## 2023-12-31 NOTE — Assessment & Plan Note (Signed)
supportive care in memory care unit [facility] since Flu 08/2021. CT head was negative for acute process. Taking Memantine. MMSE 21/30 10/13/22 

## 2023-12-31 NOTE — Assessment & Plan Note (Signed)
 blood pressure is controlled on diuretics. Bun/creat 24/1.1 08/20/23

## 2024-02-07 ENCOUNTER — Non-Acute Institutional Stay (SKILLED_NURSING_FACILITY): Payer: Self-pay | Admitting: Sports Medicine

## 2024-02-07 DIAGNOSIS — M7989 Other specified soft tissue disorders: Secondary | ICD-10-CM

## 2024-02-07 DIAGNOSIS — K219 Gastro-esophageal reflux disease without esophagitis: Secondary | ICD-10-CM

## 2024-02-07 DIAGNOSIS — F0390 Unspecified dementia without behavioral disturbance: Secondary | ICD-10-CM | POA: Diagnosis not present

## 2024-02-07 DIAGNOSIS — F5101 Primary insomnia: Secondary | ICD-10-CM

## 2024-02-07 DIAGNOSIS — F411 Generalized anxiety disorder: Secondary | ICD-10-CM

## 2024-02-07 DIAGNOSIS — J302 Other seasonal allergic rhinitis: Secondary | ICD-10-CM

## 2024-02-07 DIAGNOSIS — M159 Polyosteoarthritis, unspecified: Secondary | ICD-10-CM

## 2024-02-07 NOTE — Progress Notes (Signed)
 Provider:  Dr. Tye Gall Location:  [facility] Place of Service:   Memory care unit   PCP: Marguerite Shiley, MD Patient Care Team: Marguerite Shiley, MD as PCP - General (Internal Medicine) Amedeo Jupiter, MD (Ophthalmology) Alba Ally, MD (Psychiatry) Mast, Man X, NP as Nurse Practitioner (Nurse Practitioner) Orvan Blanch, MD as Consulting Physician (Orthopedic Surgery)  Extended Emergency Contact Information Primary Emergency Contact: Shan Dare, [place] Work Phone: [phone] Relation: Son Secondary Emergency Contact: Alfrieda Antes  [place] Home Phone: [phone] Relation: Sister  Goals of Care: Advanced Directive information    09/03/2023   11:03 AM  Advanced Directives  Does Patient Have a Medical Advance Directive? Yes  Type of Estate agent of [place];Living will;Out of facility DNR (pink MOST or yellow form)  Does patient want to make changes to medical advance directive? No - Patient declined  Copy of Healthcare Power of Attorney in Chart? Yes - validated most recent copy scanned in chart (See row information)  Pre-existing out of facility DNR order (yellow form or pink MOST form) Pink MOST/Yellow Form most recent copy in chart - Physician notified to receive inpatient order      No chief complaint on file.      History of Present Illness  83 yr old F with h/o Major neurocognitive disorder, HTN, OA, Depression, Osteoporosis is evaluated for chronic disease management.  Pt seen and examined in the living room, she is participating in group activity. Seems pleasant and comfortable, does not appear to be in distress. Able to ambulate independently  Denies joint pains. Pt denies fevers, chills, cough, SOB, abdominal pain, nausea, vomiting, dysuria, hematuria. Pt knows her name, oriented to time and place. Can tell me what she had for  breakfast.  01/23/2024 13:20 131.6 Lbs (Sitting) 12/24/2023 14:25 132.2 Lbs (Sitting) 11/23/2023 13:07 131.8 Lbs (Sitting) 11/23/2023 11:19 131.8 Lbs (Sitting) 11/21/2023 13:09 132 Lbs (Standing) 11/19/2023 14:59 132 Lbs (Standing) 11/18/2023 11:56 132 Lbs (Sitting) 10/26/2023 12:51 132 Lbs (Sitting) 09/26/2023 14:03 137 Lbs (Sitting) +3.0% change from last weight [ Comparison Weight 08/25/2023, 130.8 lbs, +5.4%, +7.0 lbs] MDS: +5.0% change over 30 day (s) [ Comparison Weight 08/25/2023, 131.0 lbs, +5.3%, +7.0 lbs] 09/25/2023 10:46 137.8 Lbs (Sitting  COMPREHENSIVE METABOLIC PANEL GLUCOSE 84 mg/dL 29-56 Final  Fasting reference interval UREA  NITROGEN (BUN) 24 mg/dL 2-13 Final CREATININE 0.86 mg/dL 5.78-4.69 H Final EGFR 48 mL/min/1.73 m2 > OR = 60 L Final BUN/CREATININE RATIO 21 (calc) 6-22 Final SODIUM 140 mmol/L 135-146 Final POTASSIUM 4.1 mmol/L 3.5-5.3 Final CHLORIDE 102 mmol/L 98-110 Final CARBON DIOXIDE 25 mmol/L 20-32 Final CALCIUM 9.1 mg/dL 6.2-95.2 Final PROTEIN, TOTAL 6.4 g/dL 8.4-1.3 Final ALBUMIN 3.9 g/dL 2.4-4.0 Final GLOBULIN 2.5 g/dL (calc) 1.0-2.7 Final ALBUMIN/GLOBULIN RATIO 1.6 (calc) 1.0-2.5 Final BILIRUBIN, TOTAL 0.4 mg/dL 2.5-3.6 Final ALKALINE PHOSPHATASE 116 U/L 37-153 Final AST 22 U/L 10-35 Final ALT 12 U/L 6-29 Final   CBC (INCLUDES DIFF/PLT) WHITE BLOOD CELL COUNT 5.9 Thousand/u L 3.8-10.8 Final RED BLOOD CELL COUNT 3.82 Million/uL 3.80-5.10 Final HEMOGLOBIN 11.8 g/dL 64.4-03.4 Final HEMATOCRIT 36.2 % 35.0-45.0 Final MCV 94.8 fL 80.0-100.0 Final MCH 30.9 pg 27.0-33.0 Final MCHC 32.6 g/dL 74.2-59.5 Final  TSH 6.38 mIU/L 0.40-4.50 Final   resident is limited assist with adls. resident transfers with walker with supervision. resident remains on regular texture, thin liquid diut.  Past Medical History:  Diagnosis Date   Allergy  Anemia    Anxiety    Asthma    CAD (coronary artery disease)    Depression    Diverticulosis     Dysfunction of eustachian tube    Elevated LFTs    Esophageal reflux    Esophageal stricture 2002   Hemorrhoids    History of rectal polyps    HLD (hyperlipidemia)    HTN (hypertension)    Hypertonicity of bladder    Irritable bowel syndrome with constipation    Obesity    Optic neuritis    Osteoporosis    Peptic ulcer disease    Peripheral neuropathy    Trigeminal neuralgia    prior injury   Visual loss, bilateral    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, 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   Vitamin D deficiency    Past Surgical History:  Procedure Laterality Date   ABDOMINAL HYSTERECTOMY     ANAL RECTAL MANOMETRY N/A 07/05/2014   Procedure: ANO RECTAL MANOMETRY;  Surgeon: Joyce Nixon, MD;  Location: [facility];  Service: Endoscopy;  Laterality: N/A;   APPENDECTOMY     CAROTID ENDARTERECTOMY     COLONOSCOPY  2008, 2015   TONSILLECTOMY     UPPER GASTROINTESTINAL ENDOSCOPY  2002    reports that she has never smoked. She has never used smokeless tobacco. She reports that she does not drink alcohol and does not use drugs. Social History   Socioeconomic History   Marital status: Divorced    Spouse name: Not on file   Number of children: 2   Years of education: Not on file   Highest education level: Not on file  Occupational History   Occupation: retired    Associate Professor: RETIRED  Tobacco Use   Smoking status: Never   Smokeless tobacco: Never  Vaping Use   Vaping status: Never Used  Substance and Sexual Activity   Alcohol use: No    Alcohol/week: 0.0 standard drinks of alcohol   Drug use: No   Sexual activity: Never  Other Topics Concern   Not on file  Social History Narrative   HSG, business college   Married 59-37yrs/divorced   2 son-'60; 2 granddaughters   Work: Diplomatic Services operational officer, travel clerk   Lives-friends home in a studio apartment, moved to [place] 04/2013   Patient never smoked. Did have second hand smoke exposure childhood and work   Alcohol none   Walks with walker    Social Drivers of Health   Financial Resource Strain: Low Risk  (06/10/2018)   Overall Financial Resource Strain (CARDIA)    Difficulty of Paying Living Expenses: Not hard at all  Food Insecurity: No Food Insecurity (06/10/2018)   Hunger Vital Sign    Worried About Running Out of Food in the Last Year: Never true    Ran Out of Food in the Last Year: Never true  Transportation Needs: No Transportation Needs (06/10/2018)   PRAPARE - Administrator, Civil Service (Medical): No    Lack of Transportation (Non-Medical): No  Physical Activity: Sufficiently Active (06/10/2018)   Exercise Vital Sign    Days of Exercise per Week: 5 days    Minutes of Exercise per Session: 30 min  Stress: No Stress Concern Present (06/10/2018)   [employer] of Occupational Health - Occupational Stress Questionnaire    Feeling of Stress : Not at all  Social Connections: Moderately Isolated (06/10/2018)   Social Connection and Isolation Panel [NHANES]    Frequency of Communication with Friends and Family:  Twice a week    Frequency of Social Gatherings with Friends and Family: Twice a week    Attends Religious Services: Never    Database administrator or Organizations: No    Attends Banker Meetings: Never    Marital Status: Divorced  Catering manager Violence: Not At Risk (06/10/2018)   Humiliation, Afraid, Rape, and Kick questionnaire    Fear of Current or Ex-Partner: No    Emotionally Abused: No    Physically Abused: No    Sexually Abused: No    Functional Status Survey:    Family History  Problem Relation Age of Onset   Alzheimer's disease Mother    Coronary artery disease Father    Heart attack Father    Heart disease Father    Anuerysm Father        AAA   Ovarian cancer Sister    Lung cancer Brother    Heart disease Brother    Anuerysm Brother        AAA   Diabetes Neg Hx    Colon cancer Neg Hx     Health Maintenance  Topic Date Due   COVID-19 Vaccine (9 -  2024-25 season) 01/08/2024   INFLUENZA VACCINE  04/24/2024   Medicare Annual Wellness (AWV)  10/09/2024   DTaP/Tdap/Td (3 - Tdap) 07/15/2029   Pneumonia Vaccine 77+ Years old  Completed   DEXA SCAN  Completed   Zoster Vaccines- Shingrix  Completed   HPV VACCINES  Aged Out   Meningococcal B Vaccine  Aged Out    Allergies  Allergen Reactions   Biaxin [Clarithromycin]    Clarithromycin    Doxycycline    Fosamax [Alendronate Sodium]    Geodon [Ziprasidone Hydrochloride]    Lipitor [Atorvastatin Calcium]    Penicillins    Pravachol [Pravastatin Sodium]    Statins    Sulfonamide Derivatives    Zithromax [Azithromycin]    Zocor [Simvastatin]     Outpatient Encounter Medications as of 02/07/2024  Medication Sig   acetaminophen  (TYLENOL ) 500 MG tablet Take 1,000 mg by mouth every 8 (eight) hours as needed for fever, headache or mild pain. Give 500 mg by mouth three times a day related to PAIN, UNSPECIFIED   alum & mag hydroxide-simeth (GERI-LANTA) 200-200-20 MG/5ML suspension Take 30 mLs by mouth at bedtime. And every 4 hours as needed   amitriptyline (ELAVIL) 50 MG tablet Take 50 mg by mouth daily.   Artificial Tears ophthalmic solution Place 1 drop into both eyes 3 (three) times daily. 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   bisacodyl  (DULCOLAX) 10 MG suppository Place 10 mg rectally daily as needed for moderate constipation.   bisacodyl  (DULCOLAX) 5 MG EC tablet Take 5 mg by mouth daily as needed for moderate constipation.   carbamide peroxide (DEBROX) 6.5 % OTIC solution 5 drops as needed (impaction). 5 drops to affected ear, otic (ear). At bedtime, PRN, Identify cerumen impaction by direct visualization by use of otoscope. Place 5 drops to affected ear QHS x3 nights then irrigate with bulb syringe. If not effective notify MD.   clonazePAM  (KLONOPIN ) 0.25 MG disintegrating tablet Take 1 tablet (0.25 mg  total) by mouth 2 (two) times daily. Morning and evening   diclofenac Sodium (VOLTAREN) 1 % GEL Apply 2 g topically 2 (two) times daily as needed (pain). apply to lower back area (Patient not taking: Reported on 08/16/2023)  fluPHENAZine (PROLIXIN) 1 MG tablet Take 1 mg by mouth at bedtime.   furosemide (LASIX) 20 MG tablet Take 20 mg by mouth daily.   guaiFENesin (MUCINEX) 600 MG 12 hr tablet Take 600 mg by mouth daily as needed (Cold or cogestion). (Patient not taking: Reported on 09/03/2023)   hydrocortisone valerate cream (WESTCORT) 0.2 % Apply to rash on face and neck topically every 12 hours as needed for Rash related to Rash and other nonspecific skin eruption (Patient not taking: Reported on 08/16/2023)   ketoconazole (NIZORAL) 2 % cream Apply 1 application  topically 2 (two) times daily. APPLY TO RASH ON FACE AND NECK TWICE DAILY AS NEEDED (Patient not taking: Reported on 09/03/2023)   ketoconazole (NIZORAL) 2 % shampoo Apply 1 application. topically 2 (two) times a week. Tuesday and Friday   loratadine (CLARITIN) 10 MG tablet Take 10 mg by mouth daily.   magnesium hydroxide (MILK OF MAGNESIA) 400 MG/5ML suspension Take 30 mLs by mouth daily as needed for mild constipation.   memantine  (NAMENDA ) 10 MG tablet Take 10 mg by mouth 2 (two) times daily.   Multiple Vitamins-Minerals (SENTRY SENIOR) TABS Take 1 tablet by mouth daily. 500-300-250 mcg [DX: Vitamin deficiency, unspecified]   OYSTER SHELL PO Take 250 mg by mouth in the morning and at bedtime.   Polyethylene Glycol 3350  (MIRALAX  PO) Take 17 g by mouth See admin instructions. Take 17 gm, oral twice a day, every other day. Miralax  17 gm + 4-8oz fluid mizture BID   saccharomyces boulardii (DAILY PROBIOTIC SUPPLEMENT) 250 MG capsule Take 250 mg by mouth daily.   SENNOSIDES PO Take 1 tablet by mouth at bedtime.   spironolactone (ALDACTONE) 25 MG tablet Take 12.5 mg by mouth daily.   triamcinolone cream (KENALOG) 0.1 % Apply 1 application   topically as needed (rash). Apply BID as needed to rash to chest, behind L ear, left posterior neck. (Patient not taking: Reported on 08/16/2023)   No facility-administered encounter medications on file as of 02/07/2024.    Review of Systems  Constitutional:  Negative for chills and fever.  Respiratory:  Negative for cough, shortness of breath and wheezing.   Cardiovascular:  Negative for chest pain, palpitations and leg swelling.  Gastrointestinal:  Negative for abdominal distention, abdominal pain, blood in stool, constipation, diarrhea, nausea and vomiting.  Genitourinary:  Negative for dysuria.  Neurological:  Negative for dizziness.   Negative unless indicated in HPI.  There were no vitals filed for this visit. There is no height or weight on file to calculate BMI. BP Readings from Last 3 Encounters:  12/31/23 119/64  12/03/23 117/82  11/18/23 135/70   Wt Readings from Last 3 Encounters:  12/31/23 132 lb 3.2 oz (60 kg)  12/03/23 131 lb 12.8 oz (59.8 kg)  11/18/23 132 lb (59.9 kg)   Physical Exam Constitutional:      Appearance: Normal appearance.  HENT:     Head: Normocephalic and atraumatic.  Cardiovascular:     Rate and Rhythm: Normal rate and regular rhythm.  Pulmonary:     Effort: Pulmonary effort is normal. No respiratory distress.     Breath sounds: Normal breath sounds. No wheezing.  Abdominal:     General: Bowel sounds are normal. There is no distension.     Tenderness: There is no abdominal tenderness. There is no guarding.     Comments:    Musculoskeletal:        General: No swelling.  Neurological:     Mental  Status: She is alert. Mental status is at baseline.     Motor: No weakness.     Labs reviewed: Basic Metabolic Panel: Recent Labs    05/09/23 0000 08/20/23 0000  NA 139 140  K 4.3 4.1  CL 103 102  CO2 28* 25*  BUN 26* 24*  CREATININE 1.1 1.1  CALCIUM 8.9 9.1   Liver Function Tests: Recent Labs    08/20/23 0000  AST 22  ALT 12   ALKPHOS 116  ALBUMIN 3.9   No results for input(s): "LIPASE", "AMYLASE" in the last 8760 hours. No results for input(s): "AMMONIA" in the last 8760 hours. CBC: Recent Labs    05/09/23 0000 08/20/23 0000  WBC 5.6 5.9  NEUTROABS  --  3,086.00  HGB 11.4* 11.8*  HCT 34* 36  PLT 280 347   Cardiac Enzymes: No results for input(s): "CKTOTAL", "CKMB", "CKMBINDEX", "TROPONINI" in the last 8760 hours. BNP: Invalid input(s): "POCBNP" Lab Results  Component Value Date   HGBA1C 5.6 06/08/2022   Lab Results  Component Value Date   TSH 1.62 08/20/2023   Lab Results  Component Value Date   VITAMINB12 404 12/31/2010   Lab Results  Component Value Date   FOLATE 12.6 05/11/2010   No results found for: "IRON", "TIBC", "FERRITIN"  Imaging and Procedures obtained prior to SNF admission: ECHOCARDIOGRAM COMPLETE Result Date: 06/08/2022    ECHOCARDIOGRAM REPORT   Patient Name:   SHAKISHA ABEND Date of Exam: 06/08/2022 Medical Rec #:  [id number]      Height:       65.0 in Accession #:    [id number]     Weight:       151.7 lb Date of Birth:  [birth year]     BSA:          1.759 m Patient Age:    83 years       BP:           161/48 mmHg Patient Gender: F              HR:           86 bpm. Exam Location:  Inpatient Procedure: 2D Echo, Cardiac Doppler and Color Doppler Indications:    TIA  History:        Patient has no prior history of Echocardiogram examinations.                 Risk Factors:Dyslipidemia and Hypertension. PVD. CKD.  Sonographer:    Crissie Dome RDCS (AE) Referring Phys: Eleni Griffin  Sonographer Comments: Technically difficult study due to poor echo windows. IMPRESSIONS  1. Left ventricular ejection fraction, by estimation, is 55 to 60%. The left ventricle has normal function. Left ventricular endocardial border not optimally defined to evaluate regional wall motion. Left ventricular diastolic parameters are indeterminate.  2. Right ventricular systolic function is normal. The right  ventricular size is normal.  3. The mitral valve was not well visualized. No evidence of mitral valve regurgitation. No evidence of mitral stenosis.  4. The aortic valve was not well visualized. Aortic valve regurgitation is not visualized. No aortic stenosis is present. Comparison(s): No prior Echocardiogram. Conclusion(s)/Recommendation(s): Very limited imaged due to poor echo windows. Grossly normal RV and LV function. No severe valve disease by Doppler, but valves not well visualized. Consider TEE if indicated to exclude cardioembolic source. FINDINGS  Left Ventricle: Left ventricular ejection fraction, by estimation, is 55 to 60%. The left ventricle has normal function. Left  ventricular endocardial border not optimally defined to evaluate regional wall motion. The left ventricular internal cavity size was normal in size. There is borderline left ventricular hypertrophy. Left ventricular diastolic parameters are indeterminate. Right Ventricle: The right ventricular size is normal. Right vetricular wall thickness was not well visualized. Right ventricular systolic function is normal. Left Atrium: Left atrial size was normal in size. Right Atrium: Right atrial size was normal in size. Pericardium: There is no evidence of pericardial effusion. Presence of epicardial fat layer. Mitral Valve: The mitral valve was not well visualized. No evidence of mitral valve regurgitation. No evidence of mitral valve stenosis. Tricuspid Valve: The tricuspid valve is not well visualized. Tricuspid valve regurgitation is trivial. No evidence of tricuspid stenosis. Aortic Valve: The aortic valve was not well visualized. Aortic valve regurgitation is not visualized. No aortic stenosis is present. Aortic valve mean gradient measures 3.0 mmHg. Aortic valve peak gradient measures 3.9 mmHg. Aortic valve area, by VTI measures 3.60 cm. Pulmonic Valve: The pulmonic valve was not well visualized. Pulmonic valve regurgitation is not  visualized. Aorta: The aortic root and ascending aorta are structurally normal, with no evidence of dilitation. Venous: The inferior vena cava was not well visualized. IAS/Shunts: The interatrial septum was not well visualized.  LEFT VENTRICLE PLAX 2D LVIDd:         4.40 cm   Diastology LV PW:         1.30 cm   LV e' medial:    3.48 cm/s LV IVS:        1.10 cm   LV E/e' medial:  16.3 LVOT diam:     2.30 cm   LV e' lateral:   7.72 cm/s LV SV:         74        LV E/e' lateral: 7.3 LV SV Index:   42 LVOT Area:     4.15 cm  RIGHT VENTRICLE RV Basal diam:  2.70 cm RV S prime:     12.80 cm/s TAPSE (M-mode): 1.4 cm LEFT ATRIUM             Index        RIGHT ATRIUM           Index LA diam:        4.20 cm 2.39 cm/m   RA Area:     12.60 cm LA Vol (A2C):   19.7 ml 11.20 ml/m  RA Volume:   29.70 ml  16.89 ml/m LA Vol (A4C):   38.9 ml 22.12 ml/m LA Biplane Vol: 30.7 ml 17.46 ml/m  AORTIC VALVE AV Area (Vmax):    3.35 cm AV Area (Vmean):   3.48 cm AV Area (VTI):     3.60 cm AV Vmax:           99.10 cm/s AV Vmean:          74.900 cm/s AV VTI:            0.204 m AV Peak Grad:      3.9 mmHg AV Mean Grad:      3.0 mmHg LVOT Vmax:         80.00 cm/s LVOT Vmean:        62.800 cm/s LVOT VTI:          0.177 m LVOT/AV VTI ratio: 0.87  AORTA Ao Root diam: 2.70 cm Ao Asc diam:  2.90 cm MITRAL VALVE MV Area (PHT): 3.19 cm    SHUNTS MV Decel Time: 238  msec    Systemic VTI:  0.18 m MV E velocity: 56.70 cm/s  Systemic Diam: 2.30 cm MV A velocity: 96.20 cm/s MV E/A ratio:  0.59 Sheryle Donning MD Electronically signed by Sheryle Donning MD Signature Date/Time: 06/08/2022/6:43:11 PM    Final    EEG adult Result Date: 06/08/2022 Arleene Lack, MD     06/08/2022  5:14 AM Patient Name: RHEANNA SERGENT MRN: [id number] Epilepsy Attending: Arleene Lack Referring Physician/Provider: Eleni Griffin, MD Date: 06/08/2022 Duration: 21.48 mins Patient history: 83yo F with ams. EEG to evaluate for seizure. Level of alertness:  Awake AEDs during EEG study: Ativan , Librium  Technical aspects: This EEG study was done with scalp electrodes positioned according to the 10-20 International system of electrode placement. Electrical activity was reviewed with band pass filter of 1-70Hz , sensitivity of 7 uV/mm, display speed of 64mm/sec with a 60Hz  notched filter applied as appropriate. EEG data were recorded continuously and digitally stored.  Video monitoring was available and reviewed as appropriate. Description: No posterior dominant rhythm was seen. There was an excessive amount of 15 to 18 Hz beta activity distributed symmetrically and diffusely.  Hyperventilation and photic stimulation were not performed.   ABNORMALITY - Excessive beta, generalized IMPRESSION: This study is within normal limits. The excessive beta activity seen in the background is most likely due to the effect of benzodiazepine and is a benign EEG pattern. No seizures or epileptiform discharges were seen throughout the recording. A normal interictal EEG does not exclude nor support the diagnosis of epilepsy. Priyanka O Yadav   MR BRAIN W WO CONTRAST Result Date: 06/08/2022 CLINICAL DATA:  Transient ischemic attack.  New onset seizure. EXAM: MRI HEAD WITHOUT AND WITH CONTRAST MRA HEAD WITHOUT CONTRAST MRA NECK WITHOUT AND WITH CONTRAST TECHNIQUE: Multiplanar, multiecho pulse sequences of the brain and surrounding structures were obtained without and with intravenous contrast. Angiographic images of the Circle of Willis were obtained using MRA technique without intravenous contrast. Angiographic images of the neck were obtained using MRA technique without and with intravenous contrast. Carotid stenosis measurements (when applicable) are obtained utilizing NASCET criteria, using the distal internal carotid diameter as the denominator. CONTRAST:  6.5 mL gadopiclenol  (Vueway ) 0.5 mmol/ml solution COMPARISON:  None Available. FINDINGS: MRI HEAD FINDINGS Brain: No acute infarct,  mass effect or extra-axial collection. No acute or chronic hemorrhage. There is multifocal hyperintense T2-weighted signal within the white matter. Generalized volume loss. The midline structures are normal. There is no abnormal contrast enhancement. Vascular: Major flow voids are preserved. Skull and upper cervical spine: Normal calvarium and skull base. Visualized upper cervical spine and soft tissues are normal. Sinuses/Orbits:No paranasal sinus fluid levels or advanced mucosal thickening. No mastoid or middle ear effusion. Normal orbits. MRA HEAD FINDINGS POSTERIOR CIRCULATION: --Vertebral arteries: Normal --Inferior cerebellar arteries: Normal. --Basilar artery: Normal. --Superior cerebellar arteries: Normal. --Posterior cerebral arteries: Normal. ANTERIOR CIRCULATION: --Intracranial internal carotid arteries: Normal. --Anterior cerebral arteries (ACA): Normal. --Middle cerebral arteries (MCA): At the right MCA bifurcation, there is a laterally projecting outpouching that measures 2 mm. This may be a small aneurysm or a chronically occluded branch vessel. Otherwise, the middle cerebral arteries are normal. ANATOMIC VARIANTS: None MRA NECK FINDINGS Left dominant vertebral arteries are patent and of normal caliber. Both carotid systems are normal without stenosis. IMPRESSION: 1. No acute intracranial abnormality. 2. 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 3. No emergent large vessel occlusion or high-grade stenosis. 4. A 2 mm laterally projecting outpouching at the right  MCA bifurcation may be a small aneurysm or an occluded branch vessel. Given the lack of acute ischemia, this is unlikely to be an acute occlusion. Electronically Signed   By: Juanetta Nordmann M.D.   On: 06/08/2022 01:42   MR ANGIO HEAD WO CONTRAST Result Date: 06/08/2022 CLINICAL DATA:  Transient ischemic attack.  New onset seizure. EXAM: MRI HEAD WITHOUT AND WITH CONTRAST MRA HEAD WITHOUT CONTRAST MRA NECK WITHOUT AND WITH CONTRAST  TECHNIQUE: Multiplanar, multiecho pulse sequences of the brain and surrounding structures were obtained without and with intravenous contrast. Angiographic images of the Circle of Willis were obtained using MRA technique without intravenous contrast. Angiographic images of the neck were obtained using MRA technique without and with intravenous contrast. Carotid stenosis measurements (when applicable) are obtained utilizing NASCET criteria, using the distal internal carotid diameter as the denominator. CONTRAST:  6.5 mL gadopiclenol  (Vueway ) 0.5 mmol/ml solution COMPARISON:  None Available. FINDINGS: MRI HEAD FINDINGS Brain: No acute infarct, mass effect or extra-axial collection. No acute or chronic hemorrhage. There is multifocal hyperintense T2-weighted signal within the white matter. Generalized volume loss. The midline structures are normal. There is no abnormal contrast enhancement. Vascular: Major flow voids are preserved. Skull and upper cervical spine: Normal calvarium and skull base. Visualized upper cervical spine and soft tissues are normal. Sinuses/Orbits:No paranasal sinus fluid levels or advanced mucosal thickening. No mastoid or middle ear effusion. Normal orbits. MRA HEAD FINDINGS POSTERIOR CIRCULATION: --Vertebral arteries: Normal --Inferior cerebellar arteries: Normal. --Basilar artery: Normal. --Superior cerebellar arteries: Normal. --Posterior cerebral arteries: Normal. ANTERIOR CIRCULATION: --Intracranial internal carotid arteries: Normal. --Anterior cerebral arteries (ACA): Normal. --Middle cerebral arteries (MCA): At the right MCA bifurcation, there is a laterally projecting outpouching that measures 2 mm. This may be a small aneurysm or a chronically occluded branch vessel. Otherwise, the middle cerebral arteries are normal. ANATOMIC VARIANTS: None MRA NECK FINDINGS Left dominant vertebral arteries are patent and of normal caliber. Both carotid systems are normal without stenosis. IMPRESSION:  1. No acute intracranial abnormality. 2. 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 3. No emergent large vessel occlusion or high-grade stenosis. 4. A 2 mm laterally projecting outpouching at the right MCA bifurcation may be a small aneurysm or an occluded branch vessel. Given the lack of acute ischemia, this is unlikely to be an acute occlusion. Electronically Signed   By: Juanetta Nordmann M.D.   On: 06/08/2022 01:42   MR ANGIO NECK W WO CONTRAST Result Date: 06/08/2022 CLINICAL DATA:  Transient ischemic attack.  New onset seizure. EXAM: MRI HEAD WITHOUT AND WITH CONTRAST MRA HEAD WITHOUT CONTRAST MRA NECK WITHOUT AND WITH CONTRAST TECHNIQUE: Multiplanar, multiecho pulse sequences of the brain and surrounding structures were obtained without and with intravenous contrast. Angiographic images of the Circle of Willis were obtained using MRA technique without intravenous contrast. Angiographic images of the neck were obtained using MRA technique without and with intravenous contrast. Carotid stenosis measurements (when applicable) are obtained utilizing NASCET criteria, using the distal internal carotid diameter as the denominator. CONTRAST:  6.5 mL gadopiclenol  (Vueway ) 0.5 mmol/ml solution COMPARISON:  None Available. FINDINGS: MRI HEAD FINDINGS Brain: No acute infarct, mass effect or extra-axial collection. No acute or chronic hemorrhage. There is multifocal hyperintense T2-weighted signal within the white matter. Generalized volume loss. The midline structures are normal. There is no abnormal contrast enhancement. Vascular: Major flow voids are preserved. Skull and upper cervical spine: Normal calvarium and skull base. Visualized upper cervical spine and soft tissues are normal. Sinuses/Orbits:No paranasal  sinus fluid levels or advanced mucosal thickening. No mastoid or middle ear effusion. Normal orbits. MRA HEAD FINDINGS POSTERIOR CIRCULATION: --Vertebral arteries: Normal --Inferior cerebellar arteries:  Normal. --Basilar artery: Normal. --Superior cerebellar arteries: Normal. --Posterior cerebral arteries: Normal. ANTERIOR CIRCULATION: --Intracranial internal carotid arteries: Normal. --Anterior cerebral arteries (ACA): Normal. --Middle cerebral arteries (MCA): At the right MCA bifurcation, there is a laterally projecting outpouching that measures 2 mm. This may be a small aneurysm or a chronically occluded branch vessel. Otherwise, the middle cerebral arteries are normal. ANATOMIC VARIANTS: None MRA NECK FINDINGS Left dominant vertebral arteries are patent and of normal caliber. Both carotid systems are normal without stenosis. IMPRESSION: 1. No acute intracranial abnormality. 2. 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 3. No emergent large vessel occlusion or high-grade stenosis. 4. A 2 mm laterally projecting outpouching at the right MCA bifurcation may be a small aneurysm or an occluded branch vessel. Given the lack of acute ischemia, this is unlikely to be an acute occlusion. Electronically Signed   By: Juanetta Nordmann M.D.   On: 06/08/2022 01:42   CT HEAD CODE STROKE WO CONTRAST Result Date: 06/07/2022 CLINICAL DATA:  Code stroke.  Neuro deficit, acute, stroke suspected EXAM: CT HEAD WITHOUT CONTRAST TECHNIQUE: Contiguous axial images were obtained from the base of the skull through the vertex without intravenous contrast. RADIATION DOSE REDUCTION: This exam was performed according to the departmental dose-optimization program which includes automated exposure control, adjustment of the mA and/or kV according to patient size and/or use of iterative reconstruction technique. COMPARISON:  CT head March 10, 2021. FINDINGS: Brain: No evidence of acute large vascular territory infarction, hemorrhage, hydrocephalus, extra-axial collection or mass lesion/mass effect. Mild for age patchy white matter hypoattenuation, nonspecific but compatible with chronic microvascular ischemic disease. Vascular: No  hyperdense vessel identified. Calcific intracranial atherosclerosis. Skull: No acute fracture. Sinuses/Orbits: No acute findings. ASPECTS [facility] Stroke Program Early CT Score) total score (0-10 with 10 being normal): 10. IMPRESSION: 1. No evidence of acute intracranial abnormality. 2. ASPECTS is 10. Code stroke imaging results were communicated on 06/07/2022 at 4:52 pm to provider Dr. Doretta Gant Via secure text paging. Electronically Signed   By: Stevenson Elbe M.D.   On: 06/07/2022 16:52    Assessment and Plan Assessment & Plan   Major neurocognitive disorder Continue with the supportive care Continue with Namenda  10 mg Please continue your usual activities  Insomnia Stable Continue with amitriptyline  Generalized anxiety disorder Doing fine Continue with the Klonopin ,  Lower extremity swelling Improved Continue Colace  Osteoarthritis Stable Continue Tylenol  as needed  GERD Denies heartburn, acid reflux   Seasonal allergies Continue with loratadine    30 MIN Total time spent for obtaining history,  performing a medically appropriate examination and evaluation, reviewing the tests,   documenting clinical information in the electronic or other health record,  ,care coordination (not separately reported)

## 2024-02-10 ENCOUNTER — Encounter: Payer: Self-pay | Admitting: Sports Medicine

## 2024-02-27 ENCOUNTER — Non-Acute Institutional Stay (SKILLED_NURSING_FACILITY): Payer: Self-pay | Admitting: Nurse Practitioner

## 2024-02-27 ENCOUNTER — Encounter: Payer: Self-pay | Admitting: Nurse Practitioner

## 2024-02-27 DIAGNOSIS — F323 Major depressive disorder, single episode, severe with psychotic features: Secondary | ICD-10-CM

## 2024-02-27 DIAGNOSIS — M8000XA Age-related osteoporosis with current pathological fracture, unspecified site, initial encounter for fracture: Secondary | ICD-10-CM

## 2024-02-27 DIAGNOSIS — I10 Essential (primary) hypertension: Secondary | ICD-10-CM

## 2024-02-27 DIAGNOSIS — K5909 Other constipation: Secondary | ICD-10-CM

## 2024-02-27 DIAGNOSIS — J309 Allergic rhinitis, unspecified: Secondary | ICD-10-CM

## 2024-02-27 DIAGNOSIS — K219 Gastro-esophageal reflux disease without esophagitis: Secondary | ICD-10-CM

## 2024-02-27 DIAGNOSIS — F0390 Unspecified dementia without behavioral disturbance: Secondary | ICD-10-CM | POA: Diagnosis not present

## 2024-02-27 DIAGNOSIS — Z66 Do not resuscitate: Secondary | ICD-10-CM

## 2024-02-27 NOTE — Assessment & Plan Note (Signed)
 Stable, on prn MOM, prn Bisacodyl, taking MiraLax and Senna daily.

## 2024-02-27 NOTE — Progress Notes (Signed)
 Location:  [facility] Room Number: N101 [id number] Place of Service:  SNF (31) Provider:  Kerman Peck, NP   Patient Care Team: Marguerite Shiley, MD as PCP - General (Internal Medicine) Amedeo Jupiter, MD (Ophthalmology) Alba Ally, MD (Psychiatry) Korby Ratay X, NP as Nurse Practitioner (Nurse Practitioner) Orvan Blanch, MD as Consulting Physician (Orthopedic Surgery)  Extended Emergency Contact Information Primary Emergency Contact: Shan Dare, [place] Work Phone: [phone] Relation: Son Secondary Emergency Contact: Alfrieda Antes  [place] Home Phone: [phone] Relation: Sister  Code Status:  DNR Goals of care: Advanced Directive information    09/03/2023   11:03 AM  Advanced Directives  Does Patient Have a Medical Advance Directive? Yes  Type of Estate agent of [place];Living will;Out of facility DNR (pink MOST or yellow form)  Does patient want to make changes to medical advance directive? No - Patient declined  Copy of Healthcare Power of Attorney in Chart? Yes - validated most recent copy scanned in chart (See row information)  Pre-existing out of facility DNR order (yellow form or pink MOST form) Pink MOST/Yellow Form most recent copy in chart - Physician notified to receive inpatient order     Chief Complaint  Patient presents with   Medical Management of Chronic Issues    Routine visit     HPI:  Pt is a 83 y.o. female seen today for medical management of chronic diseases.    HTN, blood pressure is controlled on diuretics. Bun/creat 24/1.1 08/20/23             OA on Tylenol , ambulates with walker.              GERD, stable, on  Maalox qhs, Hgb 11.8 08/20/23             Depression/anxiety/hallucination/delusions/false believes, at her baseline, resumed Amitriptyline at 50mg -failed GDR, off Librium , on Fluphenazine, Clonazepam , TSH 1.62 08/20/23              Dementia, supportive care in memory care unit [facility] since Flu 08/2021. CT head was negative for acute process. Taking Memantine . MMSE 21/30 10/13/22             Chronic constipation, on prn MOM, prn Bisacodyl , taking MiraLax  and Senna daily.              Allergic rhinitis, takes Claritin       OP, stopped taking Prolia due to cost, ? Allergic to Fosamax.  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, Right-  possible TA   Vitamin D deficiency    Past Surgical History:  Procedure Laterality Date   ABDOMINAL HYSTERECTOMY     ANAL RECTAL MANOMETRY N/A 07/05/2014   Procedure: ANO RECTAL MANOMETRY;  Surgeon: Joyce Nixon, MD;  Location: [facility];  Service: Endoscopy;  Laterality: N/A;   APPENDECTOMY     CAROTID ENDARTERECTOMY     COLONOSCOPY  2008, 2015   TONSILLECTOMY     UPPER GASTROINTESTINAL ENDOSCOPY  2002    Allergies  Allergen Reactions   Biaxin [Clarithromycin]    Clarithromycin    Doxycycline    Fosamax [Alendronate Sodium]    Geodon [Ziprasidone Hydrochloride]    Lipitor [Atorvastatin Calcium]    Penicillins    Pravachol [Pravastatin Sodium]    Statins    Sulfonamide Derivatives    Zithromax [Azithromycin]    Zocor [Simvastatin]     Outpatient Encounter Medications as of 02/27/2024   Medication Sig   acetaminophen  (TYLENOL ) 500 MG tablet Take 1,000 mg by mouth every 8 (eight) hours as needed for fever, headache or mild pain. Give 500 mg by mouth three times a day related to PAIN, UNSPECIFIED   alum & mag hydroxide-simeth (GERI-LANTA) 200-200-20 MG/5ML suspension Take 30 mLs by mouth at bedtime. And every 4 hours as needed   amitriptyline (ELAVIL) 50 MG tablet Take 50 mg by mouth daily.   Artificial Tears ophthalmic solution Place 1 drop into both eyes 3 (three) times daily. 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   bisacodyl  (DULCOLAX) 10 MG suppository Place 10 mg rectally daily as needed for moderate constipation.   bisacodyl  (DULCOLAX) 5 MG EC tablet Take 5 mg by mouth daily as needed for moderate constipation.   carbamide peroxide (DEBROX) 6.5 % OTIC solution 5 drops as needed (impaction). 5 drops to affected ear, otic (ear). At bedtime, PRN, Identify cerumen impaction by direct visualization by use of otoscope. Place 5 drops to affected ear QHS x3 nights then irrigate with bulb syringe. If not effective notify MD.   clonazePAM  (KLONOPIN ) 0.25 MG disintegrating tablet Take 1 tablet (0.25 mg total) by mouth 2 (two) times daily. Morning and evening   fluPHENAZine (PROLIXIN) 1 MG tablet Take 1 mg by mouth at bedtime.   furosemide (LASIX) 20 MG tablet Take 20 mg by mouth daily.   guaiFENesin (MUCINEX) 600 MG 12 hr tablet Take 600 mg by mouth daily as needed (Cold or cogestion).   ketoconazole (NIZORAL) 2 % shampoo Apply 1 application. topically 2 (two) times a week. Tuesday and Friday   loratadine (CLARITIN) 10 MG tablet Take 10 mg by mouth daily.   magnesium hydroxide (MILK OF MAGNESIA) 400 MG/5ML suspension Take 30 mLs by mouth daily as needed for mild constipation.   memantine  (NAMENDA ) 10 MG tablet Take 10 mg by mouth 2 (two) times daily.   Multiple Vitamins-Minerals (SENTRY SENIOR) TABS Take 1  tablet by mouth daily. 500-300-250 mcg [DX: Vitamin deficiency, unspecified]   OYSTER SHELL PO Take 250 mg by mouth in the morning and at bedtime.   Polyethylene Glycol 3350  (MIRALAX  PO) Take 17 g by mouth daily.   saccharomyces boulardii (DAILY PROBIOTIC SUPPLEMENT) 250 MG capsule Take 250 mg by mouth daily.   SENNOSIDES PO Take 1 tablet by mouth at bedtime.   spironolactone (ALDACTONE) 25 MG tablet Take 12.5 mg by mouth daily.   [DISCONTINUED] diclofenac Sodium (VOLTAREN) 1 % GEL Apply 2 g  topically 2 (two) times daily as needed (pain). apply to lower back area (Patient not taking: Reported on 08/16/2023)   [DISCONTINUED] hydrocortisone valerate cream (WESTCORT) 0.2 % Apply to rash on face and neck topically every 12 hours as needed for Rash related to Rash and other nonspecific skin eruption (Patient not taking: Reported on 08/16/2023)   [DISCONTINUED] ketoconazole (NIZORAL) 2 % cream Apply 1 application  topically 2 (two) times daily. APPLY TO RASH ON FACE AND NECK TWICE DAILY AS NEEDED (Patient not taking: Reported on 09/03/2023)   [DISCONTINUED] triamcinolone cream (KENALOG) 0.1 % Apply 1 application  topically as needed (rash). Apply BID as needed to rash to chest, behind L ear, left posterior neck. (Patient not taking: Reported on 08/16/2023)   No facility-administered encounter medications on file as of 02/27/2024.    Review of Systems  Constitutional:  Negative for appetite change, fatigue and fever.  HENT:  Positive for hearing loss. Negative for congestion, rhinorrhea and voice change.   Eyes:  Positive for visual disturbance.       Low vision  Respiratory:  Negative for cough and shortness of breath.   Cardiovascular:  Negative for leg swelling.  Gastrointestinal:  Negative for abdominal pain and constipation.  Genitourinary:  Negative for dysuria, frequency and urgency.  Musculoskeletal:  Positive for arthralgias and gait problem.  Skin:  Negative for color change.   Neurological:  Negative for speech difficulty, weakness and light-headedness.       Memory lapses  Psychiatric/Behavioral:  Positive for confusion. Negative for behavioral problems, hallucinations and sleep disturbance. The patient is not nervous/anxious.        Delusion    Immunization History  Administered Date(s) Administered   Fluad Quad(high Dose 65+) 06/26/2023   Influenza Split 07/23/2013   Influenza Whole 07/05/2009, 06/26/2018   Influenza-Unspecified 07/29/2014, 06/14/2015, 07/11/2016, 07/15/2017, 06/27/2019, 07/05/2020, 07/13/2021, 07/21/2022   Moderna Covid-19 Fall Seasonal Vaccine 56yrs & older 07/10/2023   Moderna SARS-COV2 Booster Vaccination 02/09/2022   Moderna Sars-Covid-2 Vaccination 09/26/2019, 10/24/2019, 08/02/2020   PFIZER(Purple Top)SARS-COV-2 Vaccination 09/26/2019   PPD Test 06/17/2013   Pneumococcal Conjugate-13 07/22/2017   Pneumococcal Polysaccharide-23 05/05/2009   Respiratory Syncytial Virus Vaccine,Recomb Aduvanted(Arexvy) 10/12/2022   Td 05/05/2009, 07/16/2019   Unspecified SARS-COV-2 Vaccination 02/21/2021, 06/13/2021   Zoster Recombinant(Shingrix) 09/27/2021, 12/05/2021   Zoster, Live 01/09/2011   Pertinent  Health Maintenance Due  Topic Date Due   INFLUENZA VACCINE  04/24/2024   DEXA SCAN  Completed      06/09/2022   10:00 AM 11/23/2022   11:33 AM 12/17/2022    3:49 PM 02/15/2023    2:45 PM 03/20/2023   12:00 PM  Fall Risk  Falls in the past year?  1 0 0 0  Was there an injury with Fall?  1 0 0 0  Fall Risk Category Calculator  2 0 0 0  (RETIRED) Patient Fall Risk Level High fall risk      Patient at Risk for Falls Due to  History of fall(s);Impaired balance/gait History of fall(s);Impaired balance/gait History of fall(s);Impaired balance/gait History of fall(s)  Fall risk Follow up  Falls evaluation completed Falls evaluation completed Falls evaluation completed Falls evaluation completed;Education provided;Falls prevention discussed    Functional Status Survey:    Vitals:   02/27/24 1037 02/27/24 1039  BP: (!) 158/72 (!) 150/72  Pulse: 74   Temp: 97.8 F (36.6 C)   SpO2: 94%   Weight: 129 lb 12.8 oz (58.9 kg)   Height: 5\' 5"  (1.651 m)  Body mass index is 21.6 kg/m. Physical Exam Constitutional:      Appearance: Normal appearance.  HENT:     Head: Normocephalic.     Nose: Nose normal.     Mouth/Throat:     Mouth: Mucous membranes are moist.  Eyes:     Extraocular Movements: Extraocular movements intact.     Conjunctiva/sclera: Conjunctivae normal.     Pupils: Pupils are equal, round, and reactive to light.     Comments: Legally blind  Cardiovascular:     Rate and Rhythm: Normal rate and regular rhythm.     Heart sounds: No murmur heard.    Comments: DP pulses R+L present.  Pulmonary:     Effort: Pulmonary effort is normal.     Breath sounds: No rales.  Abdominal:     General: Bowel sounds are normal.     Palpations: Abdomen is soft.     Tenderness: There is no abdominal tenderness.  Genitourinary:    Comments: From previous examination a pinto bean sized cyst like lump palpated near the clitoris, asymptomatic.  Musculoskeletal:     Cervical back: Normal range of motion and neck supple.     Right lower leg: No edema.     Left lower leg: No edema.  Skin:    General: Skin is warm and dry.  Neurological:     General: No focal deficit present.     Mental Status: She is alert. Mental status is at baseline.     Gait: Gait abnormal.     Comments: Oriented to person, place.   Psychiatric:        Mood and Affect: Mood normal.        Behavior: Behavior normal.        Thought Content: Thought content normal.     Comments: The patient appears at her baseline mentation during my examination.      Labs reviewed: Recent Labs    05/09/23 0000 08/20/23 0000  NA 139 140  K 4.3 4.1  CL 103 102  CO2 28* 25*  BUN 26* 24*  CREATININE 1.1 1.1  CALCIUM 8.9 9.1   Recent Labs    08/20/23 0000   AST 22  ALT 12  ALKPHOS 116  ALBUMIN 3.9   Recent Labs    05/09/23 0000 08/20/23 0000  WBC 5.6 5.9  NEUTROABS  --  3,086.00  HGB 11.4* 11.8*  HCT 34* 36  PLT 280 347   Lab Results  Component Value Date   TSH 1.62 08/20/2023   Lab Results  Component Value Date   HGBA1C 5.6 06/08/2022   Lab Results  Component Value Date   CHOL 201 (H) 06/08/2022   HDL 71 06/08/2022   LDLCALC 113 (H) 06/08/2022   LDLDIRECT 190.0 03/24/2012   TRIG 84 06/08/2022   CHOLHDL 2.8 06/08/2022    Significant Diagnostic Results in last 30 days:  No results found.  Assessment/Plan GERD (gastroesophageal reflux disease) stable, on  Maalox qhs, Hgb 11.8 08/20/23  Depression, psychotic (HCC) Depression/anxiety/hallucination/delusions/false believes, at her baseline, resumed Amitriptyline at 50mg -failed GDR, off Librium , on Fluphenazine, Clonazepam , TSH 1.62 08/20/23  Major neurocognitive disorder (HCC) supportive care in memory care unit [facility] since Flu 08/2021. CT head was negative for acute process. Taking Memantine . MMSE 21/30 10/13/22  Constipation Stable, on prn MOM, prn Bisacodyl , taking MiraLax  and Senna daily.   Allergic rhinitis Stable, takes Claritin      Osteoporosis stopped taking Prolia due to cost, ? Allergic to Fosamax.  her son declined DEXA  Essential hypertension Loose Bp control, intermittent elevated Sbp in 150s, asymptomatic, on diuretics. Bun/creat 24/1.1 08/20/23     Family/ staff Communication: plan of care reviewed with the patient and charge nurse.   Labs/tests ordered:  none

## 2024-02-27 NOTE — Assessment & Plan Note (Addendum)
 Loose Bp control, intermittent elevated Sbp in 150s, asymptomatic, on diuretics. Bun/creat 24/1.1 08/20/23

## 2024-02-27 NOTE — Assessment & Plan Note (Signed)
stopped taking Prolia due to cost, ? Allergic to Fosamax.  her son declined DEXA 

## 2024-02-27 NOTE — Assessment & Plan Note (Signed)
 stable, on  Maalox qhs, Hgb 11.8 08/20/23

## 2024-02-27 NOTE — Assessment & Plan Note (Signed)
 Depression/anxiety/hallucination/delusions/false believes, at her baseline, resumed Amitriptyline at 50mg -failed GDR, off Librium, on Fluphenazine, Clonazepam, TSH 1.62 08/20/23

## 2024-02-27 NOTE — Assessment & Plan Note (Signed)
supportive care in memory care unit [facility] since Flu 08/2021. CT head was negative for acute process. Taking Memantine. MMSE 21/30 10/13/22 

## 2024-02-27 NOTE — Assessment & Plan Note (Signed)
Stable, takes Claritin  

## 2024-03-24 ENCOUNTER — Encounter: Payer: Self-pay | Admitting: Nurse Practitioner

## 2024-03-24 ENCOUNTER — Non-Acute Institutional Stay (SKILLED_NURSING_FACILITY): Payer: Self-pay | Admitting: Nurse Practitioner

## 2024-03-24 DIAGNOSIS — K219 Gastro-esophageal reflux disease without esophagitis: Secondary | ICD-10-CM | POA: Diagnosis not present

## 2024-03-24 DIAGNOSIS — K5909 Other constipation: Secondary | ICD-10-CM | POA: Diagnosis not present

## 2024-03-24 DIAGNOSIS — I10 Essential (primary) hypertension: Secondary | ICD-10-CM

## 2024-03-24 DIAGNOSIS — F323 Major depressive disorder, single episode, severe with psychotic features: Secondary | ICD-10-CM | POA: Diagnosis not present

## 2024-03-24 DIAGNOSIS — F0390 Unspecified dementia without behavioral disturbance: Secondary | ICD-10-CM

## 2024-03-24 NOTE — Assessment & Plan Note (Signed)
 stable, on  Maalox qhs, Hgb 11.8 08/20/23

## 2024-03-24 NOTE — Assessment & Plan Note (Signed)
 Stable, on prn MOM, prn Bisacodyl, taking MiraLax and Senna daily.

## 2024-03-24 NOTE — Assessment & Plan Note (Signed)
 blood pressure is controlled on diuretics. Bun/creat 24/1.1 08/20/23

## 2024-03-24 NOTE — Progress Notes (Unsigned)
 Location:   [facility] Room Number: 101 Place of Service:  SNF (31) Provider: [facility] Delphina Schum NP  Charlanne Fredia CROME, MD  Patient Care Team: Charlanne Fredia CROME, MD as PCP - General (Internal Medicine) Cleatus Collar, MD (Ophthalmology) Tasia Lung, MD (Psychiatry) Fox Salminen X, NP as Nurse Practitioner (Nurse Practitioner) Duwayne Purchase, MD as Consulting Physician (Orthopedic Surgery)  Extended Emergency Contact Information Primary Emergency Contact: Franchot Francis [place], [place] Work Phone: [phone] Relation: Son Secondary Emergency Contact: Ellie Best  [place] Home Phone: [phone] Relation: Sister  Code Status:  DNR Goals of care: Advanced Directive information    09/03/2023   11:03 AM  Advanced Directives  Does Patient Have a Medical Advance Directive? Yes  Type of Estate agent of [place];Living will;Out of facility DNR (pink MOST or yellow form)  Does patient want to make changes to medical advance directive? No - Patient declined  Copy of Healthcare Power of Attorney in Chart? Yes - validated most recent copy scanned in chart (See row information)  Pre-existing out of facility DNR order (yellow form or pink MOST form) Pink MOST/Yellow Form most recent copy in chart - Physician notified to receive inpatient order     Chief Complaint  Patient presents with   Medical Management of Chronic Issues    HPI:  Pt is a 83 y.o. female seen today for medical management of chronic diseases.     HTN, blood pressure is controlled on diuretics. Bun/creat 24/1.1 08/20/23             OA on Tylenol , ambulates with walker.              GERD, stable, on  Maalox qhs, Hgb 11.8 08/20/23             Depression/anxiety/hallucination/delusions/false believes, at her baseline, resumed Amitriptyline at 50mg -failed GDR, off Librium , on Fluphenazine, Clonazepam , TSH 1.62 08/20/23             Dementia,  supportive care in memory care unit [facility] since Flu 08/2021. CT head was negative for acute process. Taking Memantine . MMSE 21/30 10/13/22             Chronic constipation, on prn MOM, prn Bisacodyl , taking MiraLax  and Senna daily.              Allergic rhinitis, takes Claritin       OP, stopped taking Prolia due to cost, ? Allergic to Fosamax.  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, 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   Vitamin  D deficiency    Past Surgical History:  Procedure Laterality Date   ABDOMINAL HYSTERECTOMY     ANAL RECTAL MANOMETRY N/A 07/05/2014   Procedure: ANO RECTAL MANOMETRY;  Surgeon: Bernarda Ned, MD;  Location: [facility];  Service: Endoscopy;  Laterality: N/A;   APPENDECTOMY     CAROTID ENDARTERECTOMY     COLONOSCOPY  2008, 2015   TONSILLECTOMY     UPPER GASTROINTESTINAL ENDOSCOPY  2002    Allergies  Allergen Reactions   Biaxin [Clarithromycin]    Clarithromycin    Doxycycline    Fosamax [Alendronate Sodium]    Geodon [Ziprasidone Hydrochloride]    Lipitor [Atorvastatin Calcium]    Penicillins    Pravachol [Pravastatin Sodium]    Statins    Sulfonamide Derivatives    Zithromax [Azithromycin]    Zocor [Simvastatin]     Allergies as of 03/24/2024       Reactions   Biaxin [clarithromycin]     Clarithromycin    Doxycycline    Fosamax [alendronate Sodium]    Geodon [ziprasidone Hydrochloride]    Lipitor [atorvastatin Calcium]    Penicillins    Pravachol [pravastatin Sodium]    Statins    Sulfonamide Derivatives    Zithromax [azithromycin]    Zocor [simvastatin]         Medication List        Accurate as of March 24, 2024 11:59 PM. If you have any questions, ask your nurse or doctor.          acetaminophen  500 MG tablet Commonly known as: TYLENOL  Take 1,000 mg by mouth every 8 (eight) hours as needed for fever, headache or mild pain. Give 500 mg by mouth three times a day related to PAIN, UNSPECIFIED   amitriptyline 50 MG tablet Commonly known as: ELAVIL Take 50 mg by mouth daily.   Artificial Tears ophthalmic solution Place 1 drop into both eyes 3 (three) times daily. 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   bisacodyl  10 MG suppository Commonly known as: DULCOLAX Place 10 mg rectally daily as needed for moderate constipation.   bisacodyl  5 MG EC tablet Commonly known as: DULCOLAX Take 5 mg by mouth daily as needed for moderate constipation.   carbamide peroxide 6.5 % OTIC solution Commonly known as: DEBROX 5 drops as needed (impaction). 5 drops to affected ear, otic (ear). At bedtime, PRN, Identify cerumen impaction by direct visualization by use of otoscope. Place 5 drops to affected ear QHS x3 nights then irrigate with bulb syringe. If not effective notify MD.   clonazePAM  0.25 MG disintegrating tablet Commonly known as: KLONOPIN  Take 1 tablet (0.25 mg total) by mouth 2 (two) times daily. Morning and evening   Daily Probiotic Supplement 250 MG capsule Generic drug: saccharomyces boulardii Take 250 mg by mouth daily.   fluPHENAZine 1 MG tablet Commonly known as: PROLIXIN Take 1 mg by mouth at bedtime.   furosemide 20 MG tablet Commonly known as: LASIX Take 20 mg  by mouth daily.   Geri-Lanta 200-200-20 MG/5ML suspension Generic drug: alum & mag hydroxide-simeth Take 30 mLs by mouth at bedtime. And every 4 hours as needed   guaiFENesin 600 MG 12 hr tablet Commonly known as: MUCINEX Take 600 mg by mouth daily as needed (Cold or cogestion).   ketoconazole 2 % shampoo Commonly known as: NIZORAL Apply 1 application. topically 2 (two) times a week. Tuesday and Friday  loratadine 10 MG tablet Commonly known as: CLARITIN Take 10 mg by mouth daily.   magnesium hydroxide 400 MG/5ML suspension Commonly known as: MILK OF MAGNESIA Take 30 mLs by mouth daily as needed for mild constipation.   memantine  10 MG tablet Commonly known as: NAMENDA  Take 10 mg by mouth 2 (two) times daily.   MIRALAX  PO Take 17 g by mouth daily.   OYSTER SHELL PO Take 250 mg by mouth in the morning and at bedtime.   SENNOSIDES PO Take 1 tablet by mouth at bedtime.   Sentry Senior Tabs Take 1 tablet by mouth daily. 500-300-250 mcg [DX: Vitamin deficiency, unspecified]   spironolactone 25 MG tablet Commonly known as: ALDACTONE Take 12.5 mg by mouth daily.        Review of Systems  Constitutional:  Negative for appetite change, fatigue and fever.  HENT:  Positive for hearing loss. Negative for congestion, rhinorrhea and voice change.   Eyes:  Positive for visual disturbance.       Low vision  Respiratory:  Negative for cough and shortness of breath.   Cardiovascular:  Negative for leg swelling.  Gastrointestinal:  Negative for abdominal pain and constipation.  Genitourinary:  Negative for dysuria, frequency and urgency.  Musculoskeletal:  Positive for arthralgias and gait problem.  Skin:  Negative for color change.  Neurological:  Negative for speech difficulty, weakness and light-headedness.       Memory lapses  Psychiatric/Behavioral:  Positive for confusion. Negative for behavioral problems, hallucinations and sleep disturbance. The patient is not  nervous/anxious.        Delusion    Immunization History  Administered Date(s) Administered   Fluad Quad(high Dose 65+) 06/26/2023   Influenza Split 07/23/2013   Influenza Whole 07/05/2009, 06/26/2018   Influenza-Unspecified 07/29/2014, 06/14/2015, 07/11/2016, 07/15/2017, 06/27/2019, 07/05/2020, 07/13/2021, 07/21/2022   Moderna Covid-19 Fall Seasonal Vaccine 36yrs & older 07/10/2023   Moderna SARS-COV2 Booster Vaccination 02/09/2022   Moderna Sars-Covid-2 Vaccination 09/26/2019, 10/24/2019, 08/02/2020   PFIZER(Purple Top)SARS-COV-2 Vaccination 09/26/2019   PPD Test 06/17/2013   Pneumococcal Conjugate-13 07/22/2017   Pneumococcal Polysaccharide-23 05/05/2009   Respiratory Syncytial Virus Vaccine,Recomb Aduvanted(Arexvy) 10/12/2022   Td 05/05/2009, 07/16/2019   Unspecified SARS-COV-2 Vaccination 02/21/2021, 06/13/2021   Zoster Recombinant(Shingrix) 09/27/2021, 12/05/2021   Zoster, Live 01/09/2011   Pertinent  Health Maintenance Due  Topic Date Due   INFLUENZA VACCINE  04/24/2024   DEXA SCAN  Completed      06/09/2022   10:00 AM 11/23/2022   11:33 AM 12/17/2022    3:49 PM 02/15/2023    2:45 PM 03/20/2023   12:00 PM  Fall Risk  Falls in the past year?  1 0 0 0  Was there an injury with Fall?  1 0 0 0  Fall Risk Category Calculator  2 0 0 0  (RETIRED) Patient Fall Risk Level High fall risk       Patient at Risk for Falls Due to  History of fall(s);Impaired balance/gait History of fall(s);Impaired balance/gait History of fall(s);Impaired balance/gait History of fall(s)  Fall risk Follow up  Falls evaluation completed Falls evaluation completed Falls evaluation completed Falls evaluation completed;Education provided;Falls prevention discussed     Data saved with a previous flowsheet row definition   Functional Status Survey:    Vitals:   03/24/24 1441  BP: 136/65  Pulse: 74  Resp: 18  Temp: 97.8 F (36.6 C)  SpO2: 94%  Weight: 130 lb 4.8 oz (59.1 kg)   Body mass index is  21.68  kg/m. Physical Exam Constitutional:      Appearance: Normal appearance.  HENT:     Head: Normocephalic.     Nose: Nose normal.     Mouth/Throat:     Mouth: Mucous membranes are moist.  Eyes:     Extraocular Movements: Extraocular movements intact.     Conjunctiva/sclera: Conjunctivae normal.     Pupils: Pupils are equal, round, and reactive to light.     Comments: Legally blind  Cardiovascular:     Rate and Rhythm: Normal rate and regular rhythm.     Heart sounds: No murmur heard.    Comments: DP pulses R+L present.  Pulmonary:     Effort: Pulmonary effort is normal.     Breath sounds: No rales.  Abdominal:     General: Bowel sounds are normal.     Palpations: Abdomen is soft.     Tenderness: There is no abdominal tenderness.  Genitourinary:    Comments: From previous examination a pinto bean sized cyst like lump palpated near the clitoris, asymptomatic.  Musculoskeletal:     Cervical back: Normal range of motion and neck supple.     Right lower leg: No edema.     Left lower leg: No edema.  Skin:    General: Skin is warm and dry.  Neurological:     General: No focal deficit present.     Mental Status: She is alert. Mental status is at baseline.     Gait: Gait abnormal.     Comments: Oriented to person, place.   Psychiatric:        Mood and Affect: Mood normal.        Behavior: Behavior normal.        Thought Content: Thought content normal.     Comments: The patient appears at her baseline mentation during my examination.      Labs reviewed: Recent Labs    05/09/23 0000 08/20/23 0000  NA 139 140  K 4.3 4.1  CL 103 102  CO2 28* 25*  BUN 26* 24*  CREATININE 1.1 1.1  CALCIUM 8.9 9.1   Recent Labs    08/20/23 0000  AST 22  ALT 12  ALKPHOS 116  ALBUMIN 3.9   Recent Labs    05/09/23 0000 08/20/23 0000  WBC 5.6 5.9  NEUTROABS  --  3,086.00  HGB 11.4* 11.8*  HCT 34* 36  PLT 280 347   Lab Results  Component Value Date   TSH 1.62 08/20/2023    Lab Results  Component Value Date   HGBA1C 5.6 06/08/2022   Lab Results  Component Value Date   CHOL 201 (H) 06/08/2022   HDL 71 06/08/2022   LDLCALC 113 (H) 06/08/2022   LDLDIRECT 190.0 03/24/2012   TRIG 84 06/08/2022   CHOLHDL 2.8 06/08/2022    Significant Diagnostic Results in last 30 days:  No results found.  Assessment/Plan  Major neurocognitive disorder (HCC)  supportive care in memory care unit [facility] since Flu 08/2021. CT head was negative for acute process. Taking Memantine . MMSE 21/30 10/13/22  Constipation Stable, on prn MOM, prn Bisacodyl , taking MiraLax  and Senna daily.   Depression, psychotic (HCC) Depression/anxiety/hallucination/delusions/false believes, at her baseline, resumed Amitriptyline at 50mg -failed GDR, off Librium , on Fluphenazine, Clonazepam , TSH 1.62 08/20/23  GERD (gastroesophageal reflux disease) stable, on  Maalox qhs, Hgb 11.8 08/20/23  Essential hypertension blood pressure is controlled on diuretics. Bun/creat 24/1.1 08/20/23   Family/ staff Communication: plan of care reviewed with the patient and charge  nurse.   Labs/tests ordered:  none

## 2024-03-24 NOTE — Assessment & Plan Note (Signed)
supportive care in memory care unit [facility] since Flu 08/2021. CT head was negative for acute process. Taking Memantine. MMSE 21/30 10/13/22 

## 2024-03-24 NOTE — Assessment & Plan Note (Signed)
 Depression/anxiety/hallucination/delusions/false believes, at her baseline, resumed Amitriptyline at 50mg -failed GDR, off Librium, on Fluphenazine, Clonazepam, TSH 1.62 08/20/23

## 2024-04-09 ENCOUNTER — Encounter: Payer: Self-pay | Admitting: Sports Medicine

## 2024-04-09 ENCOUNTER — Emergency Department (HOSPITAL_COMMUNITY)
Admission: EM | Admit: 2024-04-09 | Discharge: 2024-04-10 | Disposition: A | Discharge status: Home / Self Care | Attending: Emergency Medicine | Admitting: Emergency Medicine

## 2024-04-09 ENCOUNTER — Emergency Department (HOSPITAL_COMMUNITY)

## 2024-04-09 ENCOUNTER — Encounter (HOSPITAL_COMMUNITY): Payer: Self-pay

## 2024-04-09 ENCOUNTER — Non-Acute Institutional Stay (SKILLED_NURSING_FACILITY): Payer: Self-pay | Admitting: Sports Medicine

## 2024-04-09 DIAGNOSIS — R0781 Pleurodynia: Secondary | ICD-10-CM | POA: Insufficient documentation

## 2024-04-09 DIAGNOSIS — Z7982 Long term (current) use of aspirin: Secondary | ICD-10-CM | POA: Diagnosis not present

## 2024-04-09 DIAGNOSIS — F0390 Unspecified dementia without behavioral disturbance: Secondary | ICD-10-CM

## 2024-04-09 DIAGNOSIS — S42214A Unspecified nondisplaced fracture of surgical neck of right humerus, initial encounter for closed fracture: Secondary | ICD-10-CM | POA: Insufficient documentation

## 2024-04-09 DIAGNOSIS — W06XXXA Fall from bed, initial encounter: Secondary | ICD-10-CM | POA: Diagnosis not present

## 2024-04-09 DIAGNOSIS — M25511 Pain in right shoulder: Secondary | ICD-10-CM

## 2024-04-09 DIAGNOSIS — S4991XA Unspecified injury of right shoulder and upper arm, initial encounter: Secondary | ICD-10-CM | POA: Diagnosis present

## 2024-04-09 MED ORDER — OXYCODONE-ACETAMINOPHEN 5-325 MG PO TABS
1.0000 | ORAL_TABLET | Freq: Once | ORAL | Status: AC
  Administered 2024-04-09: 1 via ORAL
  Filled 2024-04-09: qty 1

## 2024-04-09 NOTE — Progress Notes (Signed)
 Orthopedic Tech Progress Note Patient Details:  Jean [name] Nov 05, 1940 [id number]  Ortho Devices Type of Ortho Device: Shoulder immobilizer Ortho Device/Splint Location: right Ortho Device/Splint Interventions: Ordered, Application, Adjustment   Post Interventions Patient Tolerated: Well Instructions Provided: Adjustment of device, Care of device  [name] Loving 04/09/2024, 5:51 PM

## 2024-04-09 NOTE — Progress Notes (Unsigned)
 Location:  [facility] needs teacher of Service:  SNF (31) Provider:  Sherlynn Albert, MD   Charlanne Fredia CROME, MD  Patient Care Team: Charlanne Fredia CROME, MD as PCP - General (Internal Medicine) Cleatus Collar, MD (Ophthalmology) Tasia Lung, MD (Psychiatry) Mast, Man X, NP as Nurse Practitioner (Nurse Practitioner) Duwayne Purchase, MD as Consulting Physician (Orthopedic Surgery)  Extended Emergency Contact Information Primary Emergency Contact: Franchot Francis [place], [place] Work Phone: [phone] Relation: Son Secondary Emergency Contact: Ellie Best  [place] Home Phone: [phone] Relation: Sister  Code Status:  DNR Goals of care: Advanced Directive information    04/09/2024   10:32 AM  Advanced Directives  Does Patient Have a Medical Advance Directive? Yes  Type of Estate agent of [place];Out of facility DNR (pink MOST or yellow form)  Does patient want to make changes to medical advance directive? No - Patient declined  Copy of Healthcare Power of Attorney in Chart? Yes - validated most recent copy scanned in chart (See row information)     No chief complaint on file.   HPI:  Pt is a 83 y.o. female with past medical history of major neurocognitive disorder, hypertension, osteoarthritis, depression, osteoporosis is seen today for an acute visit for right shoulder pain status post fall. Patient seen and examined in her room. She is sitting in her recliner chair.  Complains of pain in her rt side landing on her right shoulder. Patient states that she stood up from the recliner chair and fell on her right shoulder.  Patient states she is not able to move her shoulder and complains of pain. Patient denies being dizzy, lightheaded, chest pain, shortness of breath, abdominal pain, nausea, vomiting, dysuria, hematuria, bloody or dark stools.    Past Medical History:  Diagnosis Date    Allergy    Anemia    Anxiety    Asthma    CAD (coronary artery disease)    Depression    Diverticulosis    Dysfunction of eustachian tube    Elevated LFTs    Esophageal reflux    Esophageal stricture 2002   Hemorrhoids    History of rectal polyps    HLD (hyperlipidemia)    HTN (hypertension)    Hypertonicity of bladder    Irritable bowel syndrome with constipation    Obesity    Optic neuritis    Osteoporosis    Peptic ulcer disease    Peripheral neuropathy    Trigeminal neuralgia    prior injury   Visual loss, bilateral    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, 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   Vitamin D deficiency    Past Surgical History:  Procedure Laterality Date   ABDOMINAL HYSTERECTOMY     ANAL RECTAL MANOMETRY N/A 07/05/2014   Procedure: ANO RECTAL MANOMETRY;  Surgeon: Bernarda Ned, MD;  Location: [facility];  Service: Endoscopy;  Laterality: N/A;   APPENDECTOMY     CAROTID ENDARTERECTOMY     COLONOSCOPY  2008, 2015   TONSILLECTOMY     UPPER GASTROINTESTINAL ENDOSCOPY  2002    Allergies  Allergen Reactions   Biaxin [Clarithromycin]    Clarithromycin    Doxycycline    Fosamax [Alendronate Sodium]    Geodon [Ziprasidone Hydrochloride]    Lipitor [Atorvastatin Calcium]    Penicillins    Pravachol [Pravastatin Sodium]    Statins    Sulfonamide Derivatives  Zithromax [Azithromycin]    Zocor [Simvastatin]     Allergies as of 04/09/2024       Reactions   Biaxin [clarithromycin]    Clarithromycin    Doxycycline    Fosamax [alendronate Sodium]    Geodon [ziprasidone Hydrochloride]    Lipitor [atorvastatin Calcium]    Penicillins    Pravachol [pravastatin Sodium]    Statins    Sulfonamide Derivatives    Zithromax [azithromycin]    Zocor [simvastatin]         Medication List        Accurate as of April 09, 2024 10:42 AM. If you have any questions, ask your nurse or doctor.          acetaminophen  500 MG tablet Commonly known as:  TYLENOL  Take 1,000 mg by mouth every 8 (eight) hours as needed for fever, headache or mild pain. Give 500 mg by mouth three times a day related to PAIN, UNSPECIFIED   amitriptyline 50 MG tablet Commonly known as: ELAVIL Take 50 mg by mouth daily.   Artificial Tears ophthalmic solution Place 1 drop into both eyes 3 (three) times daily. 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   bisacodyl  10 MG suppository Commonly known as: DULCOLAX Place 10 mg rectally daily as needed for moderate constipation.   bisacodyl  5 MG EC tablet Commonly known as: DULCOLAX Take 5 mg by mouth daily as needed for moderate constipation.   carbamide peroxide 6.5 % OTIC solution Commonly known as: DEBROX 5 drops as needed (impaction). 5 drops to affected ear, otic (ear). At bedtime, PRN, Identify cerumen impaction by direct visualization by use of otoscope. Place 5 drops to affected ear QHS x3 nights then irrigate with bulb syringe. If not effective notify MD.   clonazePAM  0.25 MG disintegrating tablet Commonly known as: KLONOPIN  Take 1 tablet (0.25 mg total) by mouth 2 (two) times daily. Morning and evening   Daily Probiotic Supplement 250 MG capsule Generic drug: saccharomyces boulardii Take 250 mg by mouth daily.   fluPHENAZine 1 MG tablet Commonly known as: PROLIXIN Take 1 mg by mouth at bedtime.   furosemide 20 MG tablet Commonly known as: LASIX Take 20 mg by mouth daily.   Geri-Lanta 200-200-20 MG/5ML suspension Generic drug: alum & mag hydroxide-simeth Take 30 mLs by mouth at bedtime. And every 4 hours as needed   guaiFENesin 600 MG 12 hr tablet Commonly known as: MUCINEX Take 600 mg by mouth daily as needed (Cold or cogestion).   ketoconazole 2 % shampoo Commonly known as: NIZORAL Apply 1 application. topically 2 (two) times a week. Tuesday and Friday   loratadine 10 MG tablet Commonly known as: CLARITIN Take  10 mg by mouth daily.   magnesium hydroxide 400 MG/5ML suspension Commonly known as: MILK OF MAGNESIA Take 30 mLs by mouth daily as needed for mild constipation.   memantine  10 MG tablet Commonly known as: NAMENDA  Take 10 mg by mouth 2 (two) times daily.   MIRALAX  PO Take 17 g by mouth daily.   OYSTER SHELL PO Take 250 mg by mouth in the morning and at bedtime.   SENNOSIDES PO Take 1 tablet by mouth at bedtime.   Sentry Senior Tabs Take 1 tablet by mouth daily. 500-300-250 mcg [DX: Vitamin deficiency, unspecified]   spironolactone 25 MG tablet Commonly known as: ALDACTONE Take 12.5 mg by mouth daily.  Review of Systems  Constitutional:  Negative for chills and fever.  Respiratory:  Negative for cough, shortness of breath and wheezing.   Cardiovascular:  Negative for chest pain, palpitations and leg swelling.  Gastrointestinal:  Negative for abdominal distention, abdominal pain, blood in stool, constipation, diarrhea, nausea and vomiting.  Genitourinary:  Negative for dysuria.  Musculoskeletal:  Positive for arthralgias.  Neurological:  Negative for dizziness.  Psychiatric/Behavioral:  Negative for confusion.     Immunization History  Administered Date(s) Administered   Fluad Quad(high Dose 65+) 06/26/2023   Influenza Split 07/23/2013   Influenza Whole 07/05/2009, 06/26/2018   Influenza-Unspecified 07/29/2014, 06/14/2015, 07/11/2016, 07/15/2017, 06/27/2019, 07/05/2020, 07/13/2021, 07/21/2022   Moderna Covid-19 Fall Seasonal Vaccine 5yrs & older 07/10/2023   Moderna SARS-COV2 Booster Vaccination 02/09/2022   Moderna Sars-Covid-2 Vaccination 09/26/2019, 10/24/2019, 08/02/2020   PFIZER(Purple Top)SARS-COV-2 Vaccination 09/26/2019   PPD Test 06/17/2013   Pneumococcal Conjugate-13 07/22/2017   Pneumococcal Polysaccharide-23 05/05/2009   Respiratory Syncytial Virus Vaccine,Recomb Aduvanted(Arexvy) 10/12/2022   Td 05/05/2009, 07/16/2019   Unspecified SARS-COV-2  Vaccination 02/21/2021, 06/13/2021   Zoster Recombinant(Shingrix) 09/27/2021, 12/05/2021   Zoster, Live 01/09/2011   Pertinent  Health Maintenance Due  Topic Date Due   INFLUENZA VACCINE  04/24/2024   DEXA SCAN  Completed      11/23/2022   11:33 AM 12/17/2022    3:49 PM 02/15/2023    2:45 PM 03/20/2023   12:00 PM 04/09/2024   10:31 AM  Fall Risk  Falls in the past year? 1 0 0 0 1  Was there an injury with Fall? 1 0 0 0 1  Fall Risk Category Calculator 2 0 0 0 2  Patient at Risk for Falls Due to History of fall(s);Impaired balance/gait History of fall(s);Impaired balance/gait History of fall(s);Impaired balance/gait History of fall(s) History of fall(s)  Fall risk Follow up Falls evaluation completed Falls evaluation completed Falls evaluation completed Falls evaluation completed;Education provided;Falls prevention discussed Falls evaluation completed   Functional Status Survey:    Vitals:   04/09/24 1039  BP: (!) 173/84  Pulse: 74  Resp: (!) 22  Temp: (!) 97.3 F (36.3 C)  SpO2: 97%  Weight: 130 lb 4.8 oz (59.1 kg)  Height: 5' 5 (1.651 m)   Body mass index is 21.68 kg/m. Physical Exam Constitutional:      Appearance: Normal appearance.  HENT:     Head: Normocephalic and atraumatic.  Cardiovascular:     Rate and Rhythm: Normal rate and regular rhythm.  Pulmonary:     Effort: Pulmonary effort is normal. No respiratory distress.     Breath sounds: Normal breath sounds. No wheezing.  Abdominal:     General: Bowel sounds are normal. There is no distension.     Tenderness: There is no abdominal tenderness. There is no guarding or rebound.     Comments:    Musculoskeletal:     Comments: Rt shoulder- tenderness to palpation Pt unable to move her shoulder due to pain   Neurological:     Mental Status: She is alert. Mental status is at baseline.     Motor: No weakness.     Labs reviewed: Recent Labs    05/09/23 0000 08/20/23 0000  NA 139 140  K 4.3 4.1  CL 103  102  CO2 28* 25*  BUN 26* 24*  CREATININE 1.1 1.1  CALCIUM 8.9 9.1   Recent Labs    08/20/23 0000  AST 22  ALT 12  ALKPHOS 116  ALBUMIN 3.9   Recent  Labs    05/09/23 0000 08/20/23 0000  WBC 5.6 5.9  NEUTROABS  --  3,086.00  HGB 11.4* 11.8*  HCT 34* 36  PLT 280 347   Lab Results  Component Value Date   TSH 1.62 08/20/2023   Lab Results  Component Value Date   HGBA1C 5.6 06/08/2022   Lab Results  Component Value Date   CHOL 201 (H) 06/08/2022   HDL 71 06/08/2022   LDLCALC 113 (H) 06/08/2022   LDLDIRECT 190.0 03/24/2012   TRIG 84 06/08/2022   CHOLHDL 2.8 06/08/2022    Significant Diagnostic Results in last 30 days:  No results found.  Assessment/Plan  Rt shoulder pain  S/p fall  Tender to palpation  Rom- pt unable to move her shoulder Will get x ray shoulder Start oxycodone  5 mg q6 prn     Major Neurocognitive disorder Monitor for delirium  Cont assistance with ADLS  Cont with namenda       30 min Total time spent for obtaining history,  performing a medically appropriate examination and evaluation, reviewing the tests,  documenting clinical information in the electronic or other health record,  care coordination (not separately reported)

## 2024-04-09 NOTE — ED Provider Notes (Signed)
 [address] EMERGENCY DEPARTMENT AT [facility] Provider Note   CSN: [id number] Arrival date & time: 04/09/24  1543     Patient presents with: Shoulder Injury   Jean [name] is a 83 y.o. female.   Patient here after fall overnight.  Sounds like she fractured her right shoulder.  She has a history of dementia she is legally blind but she remembers fallen out of the bed yesterday.  She is also having some right-sided rib pain.  She did not think she hit her head or lost consciousness.  She is not on any blood thinners.  History of depression CAD.  She is not having any lower leg pain.  No hip pain.  No other extremity pain otherwise.  The history is provided by the patient.       Prior to Admission medications   Medication Sig Start Date End Date Taking? Authorizing Provider  acetaminophen  (TYLENOL ) 500 MG tablet Take 1,000 mg by mouth every 8 (eight) hours as needed for fever, headache or mild pain. Give 500 mg by mouth three times a day related to PAIN, UNSPECIFIED    [provider]  alum & mag hydroxide-simeth (GERI-LANTA) 200-200-20 MG/5ML suspension Take 30 mLs by mouth at bedtime. And every 4 hours as needed    [provider]  amitriptyline (ELAVIL) 50 MG tablet Take 50 mg by mouth daily.    [provider]  Artificial Tears ophthalmic solution Place 1 drop into both eyes 3 (three) times daily. 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    [provider]  bisacodyl  (DULCOLAX) 10 MG suppository Place 10 mg rectally daily as needed for moderate constipation.    [provider]  bisacodyl  (DULCOLAX) 5 MG EC tablet Take 5 mg by mouth daily as needed for moderate constipation.    [provider]  carbamide peroxide (DEBROX) 6.5 % OTIC solution 5 drops as needed (impaction). 5 drops to affected ear, otic (ear). At  bedtime, PRN, Identify cerumen impaction by direct visualization by use of otoscope. Place 5 drops to affected ear QHS x3 nights then irrigate with bulb syringe. If not effective notify MD.    [provider]  clonazePAM  (KLONOPIN ) 0.25 MG disintegrating tablet Take 1 tablet (0.25 mg total) by mouth 2 (two) times daily. Morning and evening 09/04/23   Medina-Vargas, Monina C, NP  fluPHENAZine (PROLIXIN) 1 MG tablet Take 1 mg by mouth at bedtime.    [provider]  furosemide (LASIX) 20 MG tablet Take 20 mg by mouth daily.    [provider]  guaiFENesin (MUCINEX) 600 MG 12 hr tablet Take 600 mg by mouth daily as needed (Cold or cogestion).    [provider]  ketoconazole (NIZORAL) 2 % shampoo Apply 1 application. topically 2 (two) times a week. Tuesday and Friday    [provider]  loratadine (CLARITIN) 10 MG tablet Take 10 mg by mouth daily.    [provider]  magnesium hydroxide (MILK OF MAGNESIA) 400 MG/5ML suspension Take 30 mLs by mouth daily as needed for mild constipation.    [provider]  memantine  (NAMENDA ) 10 MG tablet Take 10 mg by mouth 2 (two) times daily.    [provider]  Multiple Vitamins-Minerals (SENTRY SENIOR) TABS Take 1 tablet by mouth daily. 500-300-250 mcg [DX: Vitamin deficiency, unspecified] 09/29/21   [provider]  OYSTER SHELL PO Take 250 mg by mouth in the morning and at bedtime. 09/29/21   [provider]  Polyethylene Glycol 3350  (MIRALAX  PO) Take 17 g by mouth daily.    [provider]  saccharomyces boulardii (DAILY PROBIOTIC SUPPLEMENT) 250 MG capsule Take 250 mg by mouth daily.    [provider]  SENNOSIDES PO Take 1 tablet by mouth at bedtime.    [provider]  spironolactone (ALDACTONE) 25 MG tablet Take 12.5 mg by mouth daily. 09/29/21   [provider]    Allergies: Biaxin [clarithromycin], Clarithromycin, Doxycycline, Fosamax  [alendronate sodium], Geodon [ziprasidone hydrochloride], Lipitor [atorvastatin calcium], Penicillins, Pravachol [pravastatin sodium], Statins, Sulfonamide derivatives, Zithromax [azithromycin], and Zocor [simvastatin]    Review of Systems  Updated Vital Signs BP (!) 140/61   Pulse 75   Temp 98.5 F (36.9 C) (Oral)   Resp 15   SpO2 95%   Physical Exam Vitals and nursing note reviewed.  Constitutional:      General: She is not in acute distress.    Appearance: She is well-developed. She is not ill-appearing.  HENT:     Head: Normocephalic and atraumatic.     Mouth/Throat:     Mouth: Mucous membranes are moist.  Eyes:     Conjunctiva/sclera: Conjunctivae normal.  Cardiovascular:     Rate and Rhythm: Normal rate and regular rhythm.     Pulses: Normal pulses.     Heart sounds: Normal heart sounds. No murmur heard. Pulmonary:     Effort: Pulmonary effort is normal. No respiratory distress.     Breath sounds: Normal breath sounds.  Abdominal:     Palpations: Abdomen is soft.     Tenderness: There is no abdominal tenderness.  Musculoskeletal:        General: Tenderness present. No swelling.     Cervical back: Normal range of motion and neck supple.     Comments: Tenderness to the right shoulder and right anterior ribs  Skin:    General: Skin is warm and dry.     Capillary Refill: Capillary refill takes less than 2 seconds.  Neurological:     General: No focal deficit present.     Mental Status: She is alert and oriented to person, place, and time.     Cranial Nerves: No cranial nerve deficit.     Sensory: No sensory deficit.     Motor: No weakness.     Coordination: Coordination normal.  Psychiatric:        Mood and Affect: Mood normal.     (all labs ordered are listed, but only abnormal results are displayed) Labs Reviewed - No data to display  EKG: None  Radiology: CT Head Wo Contrast Result Date: 04/09/2024 EXAM: CT HEAD AND CERVICAL SPINE 04/09/2024 05:30:00  PM TECHNIQUE: CT of the head and cervical spine was performed without the administration of intravenous contrast. Multiplanar reformatted images are provided for review. Automated exposure control, iterative reconstruction, and/or weight based adjustment of the mA/kV was utilized to reduce the radiation dose to as low as reasonably achievable. COMPARISON: CT head 06/07/2022 and CT cervical spine 03/10/2022. CLINICAL HISTORY: Polytrauma, blunt. Per triage notes: [facility] for shoulder fracture/dislocation. Per EMS pt fell around 1am today. Facility did imaging on site (paperwork at bedside), concern for rib injury also. No head injury or LOC noted. FINDINGS: CT HEAD BRAIN AND VENTRICLES: No acute intracranial hemorrhage. No mass effect or midline shift. No abnormal extra-axial  fluid collection. Gray-white differentiation is maintained. No hydrocephalus. ORBITS: No acute abnormality. SINUSES AND MASTOIDS: No acute abnormality. SOFT TISSUES AND SKULL: No acute skull fracture. No acute soft tissue abnormality. CT CERVICAL SPINE BONES AND ALIGNMENT: No acute fracture or traumatic malalignment. DEGENERATIVE CHANGES: Spondylosis and disc space height loss is greatest at C3-C4 and C5-C6 where it is moderate. No severe spinal canal narrowing. SOFT TISSUES: No prevertebral soft tissue swelling. IMPRESSION: 1. No acute intracranial abnormality. 2. No acute fracture or traumatic malalignment of the cervical spine. Electronically signed by: Norman Gatlin MD 04/09/2024 05:37 PM EDT RP Workstation: [id number]   CT Cervical Spine Wo Contrast Result Date: 04/09/2024 EXAM: CT HEAD AND CERVICAL SPINE 04/09/2024 05:30:00 PM TECHNIQUE: CT of the head and cervical spine was performed without the administration of intravenous contrast. Multiplanar reformatted images are provided for review. Automated exposure control, iterative reconstruction, and/or weight based adjustment of the mA/kV was utilized to reduce the  radiation dose to as low as reasonably achievable. COMPARISON: CT head 06/07/2022 and CT cervical spine 03/10/2022. CLINICAL HISTORY: Polytrauma, blunt. Per triage notes: [facility] from [facility] for shoulder fracture/dislocation. Per EMS pt fell around 1am today. Facility did imaging on site (paperwork at bedside), concern for rib injury also. No head injury or LOC noted. FINDINGS: CT HEAD BRAIN AND VENTRICLES: No acute intracranial hemorrhage. No mass effect or midline shift. No abnormal extra-axial fluid collection. Gray-white differentiation is maintained. No hydrocephalus. ORBITS: No acute abnormality. SINUSES AND MASTOIDS: No acute abnormality. SOFT TISSUES AND SKULL: No acute skull fracture. No acute soft tissue abnormality. CT CERVICAL SPINE BONES AND ALIGNMENT: No acute fracture or traumatic malalignment. DEGENERATIVE CHANGES: Spondylosis and disc space height loss is greatest at C3-C4 and C5-C6 where it is moderate. No severe spinal canal narrowing. SOFT TISSUES: No prevertebral soft tissue swelling. IMPRESSION: 1. No acute intracranial abnormality. 2. No acute fracture or traumatic malalignment of the cervical spine. Electronically signed by: Norman Gatlin MD 04/09/2024 05:37 PM EDT RP Workstation: [id number]   DG Pelvis 1-2 Views Result Date: 04/09/2024 EXAM: 1 or 2 VIEW(S) XRAY OF THE PELVIS 04/09/2024 04:52:00 PM COMPARISON: None available. CLINICAL HISTORY: Fall. Per chart: [facility] from [facility] for shoulder fracture/dislocation. Per EMS pt fell around 1am today. Facility did imaging on site (paperwork at bedside), concern for rib injury also. No head injury or LOC noted. FINDINGS: BONES AND JOINTS: No acute fracture. No focal osseous lesion. No joint dislocation. SOFT TISSUES: The soft tissues are unremarkable. IMPRESSION: 1. No significant abnormality. Electronically signed by: Norman Gatlin MD 04/09/2024 05:05 PM EDT RP Workstation: [id number]   DG Ribs Unilateral W/Chest Right Result  Date: 04/09/2024 CLINICAL DATA:  fall EXAM: RIGHT RIBS AND CHEST - 3+ VIEW COMPARISON:  01/05/2020 FINDINGS: Chronic linear scarring or atelectasis at the left lung base. No pneumothorax. Unhealed fracture of the surgical neck right humerus. No displaced rib fracture. Chronic mild L1 compression deformity. IMPRESSION: 1. No displaced rib fracture. 2.  Fracture of the surgical neck right humerus. Electronically Signed   By: JONETTA Faes M.D.   On: 04/09/2024 16:58   DG Shoulder Right Result Date: 04/09/2024 CLINICAL DATA:  fall EXAM: RIGHT SHOULDER - 2+ VIEW COMPARISON:  01/05/2020 FINDINGS: Impacted transverse comminuted fracture of the surgical neck of the humerus and humeral head. There is inferior subluxation of the humeral head. No definite dislocation. IMPRESSION: Comminuted impacted fracture of the surgical neck and humeral head. Electronically Signed   By: JONETTA Faes M.D.   On:  04/09/2024 16:56     Procedures   Medications Ordered in the ED - No data to display                                  Medical Decision Making Amount and/or Complexity of Data Reviewed Radiology: ordered.   Jean [name] is here after fall overnight.  Sounds like she is ready been diagnosed with a right humeral fracture.  Will get a CT scan of her head neck reimaged the right shoulder right side of the ribs and pelvis.  She is not on any blood thinners.  She does have a history of dementia she is blind.  But she can tell me what happened.  I tried to talk to family members who are on file but nobody picked up.  Ultimately she was found to have no acute injuries to her chest wall/rib/head neck.  She did have a comminuted impacted fracture of the surgical neck and humeral head on the x-ray of her right shoulder.  There was a little bit of inferior subluxation.  No definitive dislocation.  I had Dr. Sherida with orthopedics reviewed the film as well and he just recommends a sling for now.  No need for CT scan or further  workup.  Will follow-up in 1 week with him.  Family did not pick up multiple attempts to try to reach out to them.  Patient discharged back to facility.  Well-appearing otherwise.  Normal vitals.  Had no other complaints.  Sounds like a mechanical fall.  This chart was dictated using voice recognition software.  Despite best efforts to proofread,  errors can occur which can change the documentation meaning.      Final diagnoses:  Closed nondisplaced fracture of surgical neck of right humerus, unspecified fracture morphology, initial encounter    ED Discharge Orders     None          Ruthe Cornet, DO 04/09/24 1752

## 2024-04-09 NOTE — ED Triage Notes (Addendum)
 [facility]dislocation.  Per EMS pt fell around 1am today.  Facility did imaging on site (paperwork at bedside), concern for rib injury also.  No head injury or LOC noted  Facility gave 5mg  oxy @ 1400   Pt is legally blind, history of dementia, A&O x 1.

## 2024-04-09 NOTE — Discharge Instructions (Signed)
 Patient has fracture of the proximal humerus.  Wear sling for comfort do not weight-bear to the right upper extremity.  Follow-up with Dr. Sherida orthopedics next week.  Recommend Tylenol  and ice for pain.  CT scan of her head and neck is unremarkable.  She had no evidence of rib fractures on exam as well.

## 2024-04-10 ENCOUNTER — Encounter: Payer: Self-pay | Admitting: Sports Medicine

## 2024-04-30 ENCOUNTER — Encounter: Payer: Self-pay | Admitting: Nurse Practitioner

## 2024-06-09 ENCOUNTER — Encounter: Payer: Self-pay | Admitting: Nurse Practitioner

## 2024-06-09 ENCOUNTER — Non-Acute Institutional Stay (SKILLED_NURSING_FACILITY): Admitting: Nurse Practitioner

## 2024-06-09 DIAGNOSIS — M159 Polyosteoarthritis, unspecified: Secondary | ICD-10-CM | POA: Diagnosis not present

## 2024-06-09 DIAGNOSIS — K5909 Other constipation: Secondary | ICD-10-CM

## 2024-06-09 DIAGNOSIS — I10 Essential (primary) hypertension: Secondary | ICD-10-CM

## 2024-06-09 DIAGNOSIS — F0390 Unspecified dementia without behavioral disturbance: Secondary | ICD-10-CM

## 2024-06-09 DIAGNOSIS — F323 Major depressive disorder, single episode, severe with psychotic features: Secondary | ICD-10-CM

## 2024-06-09 DIAGNOSIS — K219 Gastro-esophageal reflux disease without esophagitis: Secondary | ICD-10-CM

## 2024-06-09 DIAGNOSIS — M8000XA Age-related osteoporosis with current pathological fracture, unspecified site, initial encounter for fracture: Secondary | ICD-10-CM

## 2024-06-09 NOTE — Assessment & Plan Note (Signed)
 Depression/anxiety/hallucination/delusions/false believes, at her baseline, resumed Amitriptyline at 50mg -failed GDR, off Librium, on Fluphenazine, Clonazepam, TSH 1.62 08/20/23

## 2024-06-09 NOTE — Assessment & Plan Note (Signed)
 Stable, on prn MOM, prn Bisacodyl, taking MiraLax and Senna daily.

## 2024-06-09 NOTE — Progress Notes (Signed)
 Location:   SNF [facility] Room Number: 101 Place of Service:  SNF (31) Provider: Larwance Keelon Zurn NP  Charlanne Fredia CROME, MD  Patient Care Team: Charlanne Fredia CROME, MD as PCP - General (Internal Medicine) Cleatus Collar, MD (Ophthalmology) Tasia Lung, MD (Psychiatry) Purnell Daigle X, NP as Nurse Practitioner (Nurse Practitioner) Duwayne Purchase, MD as Consulting Physician (Orthopedic Surgery)  Extended Emergency Contact Information Primary Emergency Contact: Franchot Francis [place], [place] Phone: [phone] Relation: Son Secondary Emergency Contact: Ellie Leonor [place]  of [place] Home Phone: [phone] Relation: Sister  Code Status:  DNR Goals of care: Advanced Directive information    04/09/2024    6:16 PM  Advanced Directives  Does Patient Have a Medical Advance Directive? Yes  Type of Advance Directive Out of facility DNR (pink MOST or yellow form)  Does patient want to make changes to medical advance directive? Yes (ED - Information included in AVS)  Copy of Healthcare Power of Attorney in Chart? Yes - validated most recent copy scanned in chart (See row information)  Pre-existing out of facility DNR order (yellow form or pink MOST form) Pink MOST/Yellow Form most recent copy in chart - Physician notified to receive inpatient order     Chief Complaint  Patient presents with   Medical Management of Chronic Issues    HPI:  Pt is a 83 y.o. female seen today for medical management of chronic diseases.    Closed nondisplaced fracture of the surgical neck of right humerus 04/09/2024, healed   HTN, blood pressure is controlled on diuretics. Bun/creat 24/1.1 08/20/23             OA on Tylenol , ambulates with walker.              GERD, stable, on  Maalox qhs, Hgb 11.8 08/20/23             Depression/anxiety/hallucination/delusions/false believes, at her baseline, resumed Amitriptyline at 50mg -failed GDR, off Librium , on  Fluphenazine, Clonazepam , TSH 1.62 08/20/23             Dementia, supportive care in memory care unit [facility] since Flu 08/2021. CT head was negative for acute process. Taking Memantine . MMSE 21/30 10/13/22             Chronic constipation, on prn MOM, prn Bisacodyl , taking MiraLax  and Senna daily.              Allergic rhinitis, takes Claritin       OP, stopped taking Prolia due to cost, ? Allergic to Fosamax.  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     Past Medical History:  Diagnosis Date   Allergy    Anemia    Anxiety    Asthma    CAD (coronary artery disease)    Depression    Diverticulosis    Dysfunction of eustachian tube    Elevated LFTs    Esophageal reflux    Esophageal stricture 2002   Hemorrhoids    History of rectal polyps    HLD (hyperlipidemia)    HTN (hypertension)    Hypertonicity of bladder    Irritable bowel syndrome with constipation    Obesity    Optic neuritis    Osteoporosis    Peptic ulcer disease    Peripheral neuropathy    Trigeminal neuralgia    prior injury   Visual loss, bilateral    left- retinal  artery occulusion vs  optic neuritis, 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   Vitamin D deficiency    Past Surgical History:  Procedure Laterality Date   ABDOMINAL HYSTERECTOMY     ANAL RECTAL MANOMETRY N/A 07/05/2014   Procedure: ANO RECTAL MANOMETRY;  Surgeon: Bernarda Ned, MD;  Location: [facility];  Service: Endoscopy;  Laterality: N/A;   APPENDECTOMY     CAROTID ENDARTERECTOMY     COLONOSCOPY  2008, 2015   TONSILLECTOMY     UPPER GASTROINTESTINAL ENDOSCOPY  2002    Allergies  Allergen Reactions   Biaxin [Clarithromycin] Other (See Comments)    Allergic, per [facility]   Doxycycline Other (See Comments)    Allergic, per [facility]   Fosamax [Alendronate Sodium] Other (See Comments)    Allergic, per [facility]   Geodon [Ziprasidone Hydrochloride] Other (See Comments)    Allergic, per [facility]   Lipitor [Atorvastatin  Calcium] Other (See Comments)    Allergic, per [facility]   Penicillins Other (See Comments)    Allergic, per [facility]   Pravachol [Pravastatin Sodium] Other (See Comments)    Allergic, per [facility]   Statins Other (See Comments)    Allergic, per [facility]   Sulfa Antibiotics Other (See Comments)    Allergic, per [facility]   Zithromax [Azithromycin] Other (See Comments)    Allergic, per [facility]   Zocor [Simvastatin] Other (See Comments)    Allergic, per [facility]    Allergies as of 06/09/2024       Reactions   Biaxin [clarithromycin] Other (See Comments)   Allergic, per [facility]   Doxycycline Other (See Comments)   Allergic, per [facility]   Fosamax [alendronate Sodium] Other (See Comments)   Allergic, per [facility]   Geodon [ziprasidone Hydrochloride] Other (See Comments)   Allergic, per [facility]   Lipitor [atorvastatin Calcium] Other (See Comments)   Allergic, per [facility]   Penicillins Other (See Comments)   Allergic, per [facility]   Pravachol [pravastatin Sodium] Other (See Comments)   Allergic, per [facility]   Statins Other (See Comments)   Allergic, per [facility]   Sulfa Antibiotics Other (See Comments)   Allergic, per [facility]   Zithromax [azithromycin] Other (See Comments)   Allergic, per [facility]   Zocor [simvastatin] Other (See Comments)   Allergic, per [facility]        Medication List        Accurate as of June 09, 2024  2:16 PM. If you have any questions, ask your nurse or doctor.          acetaminophen  500 MG tablet Commonly known as: TYLENOL  Take 500-1,000 mg by mouth See admin instructions. Take 500 mg by mouth three times a day and an 1,000 mg every eight hours as needed for pain, fever, or headaches   Artificial Tears ophthalmic solution Place 1 drop into both eyes 3 (three) times daily. 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   bisacodyl  10 MG suppository Commonly known as: DULCOLAX Place 10 mg rectally daily as  needed (for constipation).   bisacodyl  5 MG EC tablet Commonly known as: DULCOLAX Take 5 mg by mouth daily as needed (for constipation).   carbamide peroxide 6.5 % OTIC solution Commonly known as: DEBROX Place 5 drops into both ears daily as needed (for pain from ear wax impaction).   clonazePAM  0.25 MG disintegrating tablet Commonly known as: KLONOPIN  Take 1 tablet (0.25 mg total) by mouth 2 (two) times daily. Morning and  evening   Daily Probiotic Supplement 250 MG capsule Generic drug: saccharomyces boulardii Take 250 mg by mouth daily.   fluPHENAZine 1 MG tablet Commonly known as: PROLIXIN Take 1 mg by mouth at bedtime.   furosemide 20 MG tablet Commonly known as: LASIX Take 20 mg by mouth daily.   Geri-Lanta 200-200-20 MG/5ML suspension Generic drug: alum & mag hydroxide-simeth Take 30 mLs by mouth See admin instructions. Take 30 ml's by mouth at bedtime and an additional 30 ml's every four hours as needed for heartburn/indigestion   guaiFENesin 600 MG 12 hr tablet Commonly known as: MUCINEX Take 600 mg by mouth daily as needed (for nasal congestion).   ketoconazole 2 % shampoo Commonly known as: NIZORAL Apply 1 application  topically See admin instructions. Shampoo on Tuesdays and Fridays   loratadine 10 MG tablet Commonly known as: CLARITIN Take 10 mg by mouth daily.   magnesium hydroxide 400 MG/5ML suspension Commonly known as: MILK OF MAGNESIA Take 30 mLs by mouth every 4 (four) hours as needed for mild constipation.   memantine  10 MG tablet Commonly known as: NAMENDA  Take 10 mg by mouth in the morning and at bedtime.   oxyCODONE  5 MG immediate release tablet Commonly known as: Oxy IR/ROXICODONE  Take 5 mg by mouth every 8 (eight) hours as needed (for pain).   OYSTER SHELL PO Take 250 mg by mouth in the morning and at bedtime.   PEG 3350  17 GM/SCOOP Powd Take 17 g by mouth in the morning.   SENNOSIDES PO Take 1 tablet by mouth at bedtime.   Sentry  Senior Tabs Take 1 tablet by mouth daily with breakfast.   spironolactone 25 MG tablet Commonly known as: ALDACTONE Take 12.5 mg by mouth daily.        Review of Systems  Constitutional:  Negative for appetite change, fatigue and fever.  HENT:  Positive for hearing loss. Negative for congestion, rhinorrhea and voice change.   Eyes:  Positive for visual disturbance.       Low vision  Respiratory:  Negative for cough and shortness of breath.   Cardiovascular:  Negative for leg swelling.  Gastrointestinal:  Negative for abdominal pain and constipation.  Genitourinary:  Negative for dysuria, frequency and urgency.  Musculoskeletal:  Positive for arthralgias and gait problem.  Skin:  Negative for color change.  Neurological:  Negative for speech difficulty, weakness and light-headedness.       Memory lapses  Psychiatric/Behavioral:  Positive for confusion. Negative for behavioral problems, hallucinations and sleep disturbance. The patient is not nervous/anxious.        Delusion    Immunization History  Administered Date(s) Administered   Fluad Quad(high Dose 65+) 06/26/2023   Influenza Split 07/23/2013   Influenza Whole 07/05/2009, 06/26/2018   Influenza-Unspecified 07/29/2014, 06/14/2015, 07/11/2016, 07/15/2017, 06/27/2019, 07/05/2020, 07/13/2021, 07/21/2022   Moderna Covid-19 Fall Seasonal Vaccine 75yrs & older 07/10/2023   Moderna SARS-COV2 Booster Vaccination 02/09/2022   Moderna Sars-Covid-2 Vaccination 09/26/2019, 10/24/2019, 08/02/2020   PFIZER(Purple Top)SARS-COV-2 Vaccination 09/26/2019   PPD Test 06/17/2013   Pneumococcal Conjugate-13 07/22/2017   Pneumococcal Polysaccharide-23 05/05/2009   Respiratory Syncytial Virus Vaccine,Recomb Aduvanted(Arexvy) 10/12/2022   Td 05/05/2009, 07/16/2019   Unspecified SARS-COV-2 Vaccination 02/21/2021, 06/13/2021   Zoster Recombinant(Shingrix) 09/27/2021, 12/05/2021   Zoster, Live 01/09/2011   Pertinent  Health Maintenance Due   Topic Date Due   Influenza Vaccine  04/24/2024   DEXA SCAN  Completed      11/23/2022   11:33 AM 12/17/2022  3:49 PM 02/15/2023    2:45 PM 03/20/2023   12:00 PM 04/09/2024   10:31 AM  Fall Risk  Falls in the past year? 1 0 0 0 1  Was there an injury with Fall? 1 0 0 0 1  Fall Risk Category Calculator 2 0 0 0 2  Patient at Risk for Falls Due to History of fall(s);Impaired balance/gait History of fall(s);Impaired balance/gait History of fall(s);Impaired balance/gait History of fall(s) History of fall(s)  Fall risk Follow up Falls evaluation completed Falls evaluation completed Falls evaluation completed Falls evaluation completed;Education provided;Falls prevention discussed Falls evaluation completed   Functional Status Survey:    Vitals:   06/09/24 1211  BP: 131/81  Resp: 18  Temp: 97.8 F (36.6 C)  SpO2: 95%  Weight: 125 lb 1.6 oz (56.7 kg)   Body mass index is 20.82 kg/m. Physical Exam Constitutional:      Appearance: Normal appearance.  HENT:     Head: Normocephalic.     Nose: Nose normal.     Mouth/Throat:     Mouth: Mucous membranes are moist.  Eyes:     Extraocular Movements: Extraocular movements intact.     Conjunctiva/sclera: Conjunctivae normal.     Pupils: Pupils are equal, round, and reactive to light.     Comments: Legally blind  Cardiovascular:     Rate and Rhythm: Normal rate and regular rhythm.     Heart sounds: No murmur heard.    Comments: DP pulses R+L present.  Pulmonary:     Effort: Pulmonary effort is normal.     Breath sounds: No rales.  Abdominal:     General: Bowel sounds are normal.     Palpations: Abdomen is soft.     Tenderness: There is no abdominal tenderness.  Genitourinary:    Comments: From previous examination a pinto bean sized cyst like lump palpated near the clitoris, asymptomatic.  Musculoskeletal:     Cervical back: Normal range of motion and neck supple.     Right lower leg: No edema.     Left lower leg: No edema.      Comments: Right shoulder has limited range of motion overhead  Skin:    General: Skin is warm and dry.  Neurological:     General: No focal deficit present.     Mental Status: She is alert. Mental status is at baseline.     Gait: Gait abnormal.     Comments: Oriented to person, place.   Psychiatric:        Mood and Affect: Mood normal.        Behavior: Behavior normal.        Thought Content: Thought content normal.     Comments: The patient appears at her baseline mentation during my examination.      Labs reviewed: Recent Labs    08/20/23 0000  NA 140  K 4.1  CL 102  CO2 25*  BUN 24*  CREATININE 1.1  CALCIUM 9.1   Recent Labs    08/20/23 0000  AST 22  ALT 12  ALKPHOS 116  ALBUMIN 3.9   Recent Labs    08/20/23 0000  WBC 5.9  NEUTROABS 3,086.00  HGB 11.8*  HCT 36  PLT 347   Lab Results  Component Value Date   TSH 1.62 08/20/2023   Lab Results  Component Value Date   HGBA1C 5.6 06/08/2022   Lab Results  Component Value Date   CHOL 201 (H) 06/08/2022   HDL 71  06/08/2022   LDLCALC 113 (H) 06/08/2022   LDLDIRECT 190.0 03/24/2012   TRIG 84 06/08/2022   CHOLHDL 2.8 06/08/2022    Significant Diagnostic Results in last 30 days:  No results found.  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  Closed nondisplaced fracture of the surgical neck of right humerus 04/09/2024, healed  GERD (gastroesophageal reflux disease)  stable, on  Maalox qhs, Hgb 11.8 08/20/23  Depression, psychotic (HCC) Depression/anxiety/hallucination/delusions/false believes, at her baseline, resumed Amitriptyline at 50mg -failed GDR, off Librium , on Fluphenazine, Clonazepam , TSH 1.62 08/20/23  Major neurocognitive disorder (HCC) supportive care in memory  care unit [facility] since Flu 08/2021. CT head was negative for acute process. Taking Memantine . MMSE 21/30 10/13/22  Constipation Stable, on prn MOM, prn Bisacodyl , taking MiraLax  and Senna daily.   Osteoporosis stopped taking Prolia due to cost, ? Allergic to Fosamax.  her son declined DEXA   Family/ staff Communication: Plan of care reviewed with the patient and charge nurse  Labs/tests ordered: CBC/differential, CMP/ eGFR, TSH, lipids, vitamin B12, vitamin D, HgbA1c

## 2024-06-09 NOTE — Assessment & Plan Note (Signed)
 stable, on  Maalox qhs, Hgb 11.8 08/20/23

## 2024-06-09 NOTE — Assessment & Plan Note (Signed)
stopped taking Prolia due to cost, ? Allergic to Fosamax.  her son declined DEXA 

## 2024-06-09 NOTE — Assessment & Plan Note (Signed)
 Blood pressure is controlled, continue diuretics Update CBC/differential, CMP/ eGFR, TSH, lipids, vitamin B12, vitamin D, HgbA1c

## 2024-06-09 NOTE — Assessment & Plan Note (Signed)
 on Tylenol , ambulates with walker.  Closed nondisplaced fracture of the surgical neck of right humerus 04/09/2024, healed

## 2024-06-09 NOTE — Assessment & Plan Note (Signed)
supportive care in memory care unit [facility] since Flu 08/2021. CT head was negative for acute process. Taking Memantine. MMSE 21/30 10/13/22 

## 2024-06-11 LAB — HEMOGLOBIN A1C: Hemoglobin A1C: 5.7

## 2024-06-11 LAB — CBC: RBC: 4.32 (ref 3.87–5.11)

## 2024-06-11 LAB — LIPID PANEL
Cholesterol: 255 — AB (ref 0–200)
HDL: 83 — AB (ref 35–70)
LDL Cholesterol: 146
LDl/HDL Ratio: 3.1
Triglycerides: 131 (ref 40–160)

## 2024-06-11 LAB — BASIC METABOLIC PANEL WITH GFR
BUN: 20 (ref 4–21)
CO2: 31 — AB (ref 13–22)
Chloride: 102 (ref 99–108)
Creatinine: 1 (ref 0.5–1.1)
Glucose: 94
Potassium: 4.8 meq/L (ref 3.5–5.1)
Sodium: 141 (ref 137–147)

## 2024-06-11 LAB — COMPREHENSIVE METABOLIC PANEL WITH GFR
Albumin: 4.6 (ref 3.5–5.0)
Calcium: 9.8 (ref 8.7–10.7)
Globulin: 2.7
eGFR: 56

## 2024-06-11 LAB — HEPATIC FUNCTION PANEL
ALT: 15 U/L (ref 7–35)
AST: 25 (ref 13–35)
Alkaline Phosphatase: 131 — AB (ref 25–125)
Bilirubin, Total: 0.4

## 2024-06-11 LAB — VITAMIN B12: Vitamin B-12: 342

## 2024-06-11 LAB — CBC AND DIFFERENTIAL
HCT: 41 (ref 36–46)
Hemoglobin: 13.5 (ref 12.0–16.0)
Neutrophils Absolute: 3656
Platelets: 397 K/uL (ref 150–400)
WBC: 6.6

## 2024-06-11 LAB — TSH: TSH: 1.3 (ref 0.41–5.90)

## 2024-06-11 LAB — VITAMIN D 25 HYDROXY (VIT D DEFICIENCY, FRACTURES): Vit D, 25-Hydroxy: 41

## 2024-06-24 ENCOUNTER — Non-Acute Institutional Stay (SKILLED_NURSING_FACILITY): Admitting: Nurse Practitioner

## 2024-06-24 ENCOUNTER — Encounter: Payer: Self-pay | Admitting: Nurse Practitioner

## 2024-06-24 DIAGNOSIS — M159 Polyosteoarthritis, unspecified: Secondary | ICD-10-CM

## 2024-06-24 DIAGNOSIS — F323 Major depressive disorder, single episode, severe with psychotic features: Secondary | ICD-10-CM

## 2024-06-24 DIAGNOSIS — K5909 Other constipation: Secondary | ICD-10-CM

## 2024-06-24 DIAGNOSIS — F0390 Unspecified dementia without behavioral disturbance: Secondary | ICD-10-CM

## 2024-06-24 DIAGNOSIS — I10 Essential (primary) hypertension: Secondary | ICD-10-CM

## 2024-06-24 DIAGNOSIS — K219 Gastro-esophageal reflux disease without esophagitis: Secondary | ICD-10-CM | POA: Diagnosis not present

## 2024-06-24 DIAGNOSIS — E785 Hyperlipidemia, unspecified: Secondary | ICD-10-CM

## 2024-06-24 DIAGNOSIS — R7303 Prediabetes: Secondary | ICD-10-CM | POA: Insufficient documentation

## 2024-06-24 NOTE — Assessment & Plan Note (Signed)
 stable, on  Maalox qhs, Hgb 13.5 06/11/24

## 2024-06-24 NOTE — Assessment & Plan Note (Signed)
 Hgb A1c 5.7, diet controlled

## 2024-06-24 NOTE — Assessment & Plan Note (Signed)
 supportive care in memory care unit [facility] since Flu 08/2021. CT head was negative for acute process. Taking Memantine . MMSE 21/30 10/13/22, Vit B12 342 06/11/24

## 2024-06-24 NOTE — Assessment & Plan Note (Signed)
 blood pressure is controlled on diuretics. Bun/creat 20/1.0 06/11/24

## 2024-06-24 NOTE — Assessment & Plan Note (Signed)
 Stable, on prn MOM, prn Bisacodyl, taking MiraLax and Senna daily.

## 2024-06-24 NOTE — Assessment & Plan Note (Signed)
 LDL 146 06/11/24, not taking statin.

## 2024-06-24 NOTE — Progress Notes (Unsigned)
 Location:  [facility] Room Number: 101 A Place of Service:  SNF (31) Provider:  Joh Rao X, NP   Patient Care Team: Charlanne Fredia CROME, MD as PCP - General (Internal Medicine) Cleatus Collar, MD (Ophthalmology) Tasia Lung, MD (Psychiatry) Qais Jowers X, NP as Nurse Practitioner (Nurse Practitioner) Duwayne Purchase, MD as Consulting Physician (Orthopedic Surgery)  Extended Emergency Contact Information Primary Emergency Contact: Franchot Francis [place], [place] Phone: [phone] Relation: Son Secondary Emergency Contact: Ellie Best  [place] Home Phone: [phone] Relation: Sister  Code Status:  DNR Goals of care: Advanced Directive information    04/09/2024    6:16 PM  Advanced Directives  Does Patient Have a Medical Advance Directive? Yes  Type of Advance Directive Out of facility DNR (pink MOST or yellow form)  Does patient want to make changes to medical advance directive? Yes (ED - Information included in AVS)  Copy of Healthcare Power of Attorney in Chart? Yes - validated most recent copy scanned in chart (See row information)  Pre-existing out of facility DNR order (yellow form or pink MOST form) Pink MOST/Yellow Form most recent copy in chart - Physician notified to receive inpatient order     Chief Complaint  Patient presents with   Routine Visit    HPI:  Pt is a 83 y.o. female seen today for medical management of chronic diseases.     Closed nondisplaced fracture of the surgical neck of right humerus 04/09/2024, healed              HTN, blood pressure is controlled on diuretics. Bun/creat 20/1.0 06/11/24             OA on Tylenol , ambulates with walker.              GERD, stable, on  Maalox qhs, Hgb 13.5 06/11/24             Depression/anxiety/hallucination/delusions/false believes, at her baseline, attempted to GDR on resumed Amitriptyline at 50mg -failed GDR, off Librium , on  Venlafaxine, Fluphenazine, Clonazepam , TSH 1.3 06/11/24             Dementia, supportive care in memory care unit [facility] since Flu 08/2021. CT head was negative for acute process. Taking Memantine . MMSE 21/30 10/13/22, Vit B12 342 06/11/24             Chronic constipation, on prn MOM, prn Bisacodyl , taking MiraLax  and Senna daily.              Allergic rhinitis, takes Claritin       OP, stopped taking Prolia due to cost, ? Allergic to Fosamax.  her son declined DEXA, Vit D 41 06/11/24 Dermatitis, mostly in cheeks, intermittent, follows dermatology.  HLD LDL 146 06/11/24, allergic to statin Prediabetes Hgb A1c 5.7, diet controlled    Past Medical History:  Diagnosis Date   Allergy    Anemia    Anxiety    Asthma    CAD (coronary artery disease)    Depression    Diverticulosis    Dysfunction of eustachian tube    Elevated LFTs    Esophageal reflux    Esophageal stricture 2002   Hemorrhoids    History of rectal polyps    HLD (hyperlipidemia)    HTN (hypertension)    Hypertonicity of bladder    Irritable bowel syndrome with constipation    Obesity    Optic neuritis  Osteoporosis    Peptic ulcer disease    Peripheral neuropathy    Trigeminal neuralgia    prior injury   Visual loss, bilateral    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, 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   Vitamin D deficiency    Past Surgical History:  Procedure Laterality Date   ABDOMINAL HYSTERECTOMY     ANAL RECTAL MANOMETRY N/A 07/05/2014   Procedure: ANO RECTAL MANOMETRY;  Surgeon: Bernarda Ned, MD;  Location: [facility];  Service: Endoscopy;  Laterality: N/A;   APPENDECTOMY     CAROTID ENDARTERECTOMY     COLONOSCOPY  2008, 2015   TONSILLECTOMY     UPPER GASTROINTESTINAL ENDOSCOPY  2002    Allergies  Allergen Reactions   Biaxin [Clarithromycin] Other (See Comments)    Allergic, per [facility]   Doxycycline Other (See Comments)    Allergic, per [facility]   Fosamax [Alendronate Sodium] Other (See Comments)     Allergic, per [facility]   Geodon [Ziprasidone Hydrochloride] Other (See Comments)    Allergic, per [facility]   Lipitor [Atorvastatin Calcium] Other (See Comments)    Allergic, per [facility]   Penicillins Other (See Comments)    Allergic, per [facility]   Pravachol [Pravastatin Sodium] Other (See Comments)    Allergic, per [facility]   Statins Other (See Comments)    Allergic, per [facility]   Sulfa Antibiotics Other (See Comments)    Allergic, per [facility]   Zithromax [Azithromycin] Other (See Comments)    Allergic, per [facility]   Zocor [Simvastatin] Other (See Comments)    Allergic, per [facility]    Outpatient Encounter Medications as of 06/24/2024  Medication Sig   acetaminophen  (TYLENOL ) 500 MG tablet Take 500-1,000 mg by mouth See admin instructions. Take 500 mg by mouth three times a day and an 1,000 mg every eight hours as needed for pain, fever, or headaches   Artificial Tears ophthalmic solution Place 1 drop into both eyes 3 (three) times daily. 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   bisacodyl  (DULCOLAX) 10 MG suppository Place 10 mg rectally daily as needed (for constipation).   bisacodyl  (DULCOLAX) 5 MG EC tablet Take 5 mg by mouth daily as needed (for constipation).   carbamide peroxide (DEBROX) 6.5 % OTIC solution Place 5 drops into both ears daily as needed (for pain from ear wax impaction).   clonazePAM  (KLONOPIN ) 0.25 MG disintegrating tablet Take 1 tablet (0.25 mg total) by mouth 2 (two) times daily. Morning and evening   fluPHENAZine (PROLIXIN) 1 MG tablet Take 1 mg by mouth at bedtime.   furosemide (LASIX) 20 MG tablet Take 20 mg by mouth daily.   GERI-LANTA 200-200-20 MG/5ML suspension Take 30 mLs by mouth See admin instructions. Take 30 ml's by mouth at bedtime and an additional 30 ml's every four hours as needed for heartburn/indigestion   guaiFENesin (MUCINEX) 600 MG 12 hr tablet Take 600 mg by mouth daily as needed (for  nasal congestion).   ketoconazole (NIZORAL) 2 % shampoo Apply 1 application  topically See admin instructions. Shampoo on Tuesdays and Fridays   loratadine (CLARITIN) 10 MG tablet Take 10 mg by mouth daily.   magnesium hydroxide (MILK OF MAGNESIA) 400 MG/5ML suspension Take 30 mLs by mouth every 4 (four) hours as needed for mild constipation.   memantine  (NAMENDA ) 10 MG tablet Take 10 mg by mouth in the morning and at bedtime.   Multiple Vitamins-Minerals (  SENTRY SENIOR) TABS Take 1 tablet by mouth daily with breakfast.   oxyCODONE  (OXY IR/ROXICODONE ) 5 MG immediate release tablet Take 5 mg by mouth every 8 (eight) hours as needed (for pain).   OYSTER SHELL PO Take 250 mg by mouth in the morning and at bedtime.   Polyethylene Glycol 3350  (PEG 3350 ) 17 GM/SCOOP POWD Take 17 g by mouth in the morning.   saccharomyces boulardii (DAILY PROBIOTIC SUPPLEMENT) 250 MG capsule Take 250 mg by mouth daily.   SENNOSIDES PO Take 1 tablet by mouth at bedtime.   spironolactone (ALDACTONE) 25 MG tablet Take 12.5 mg by mouth daily.   venlafaxine (EFFEXOR) 50 MG tablet Take 50 mg by mouth daily. Give 1 tablet by mouth one time a day related to DEPRESSION, UNSPECIFIED (F32.A)   No facility-administered encounter medications on file as of 06/24/2024.    Review of Systems  Constitutional:  Negative for appetite change, fatigue and fever.  HENT:  Positive for hearing loss. Negative for congestion, rhinorrhea and voice change.   Eyes:  Positive for visual disturbance.       Low vision  Respiratory:  Negative for cough and shortness of breath.   Cardiovascular:  Negative for leg swelling.  Gastrointestinal:  Negative for abdominal pain and constipation.  Genitourinary:  Negative for dysuria, frequency and urgency.  Musculoskeletal:  Positive for arthralgias and gait problem.  Skin:  Negative for color change.  Neurological:  Negative for speech difficulty, weakness and light-headedness.       Memory lapses   Psychiatric/Behavioral:  Positive for confusion. Negative for behavioral problems, hallucinations and sleep disturbance. The patient is not nervous/anxious.        Delusion    Immunization History  Administered Date(s) Administered   Fluad Quad(high Dose 65+) 06/26/2023   Influenza Split 07/23/2013   Influenza Whole 07/05/2009, 06/26/2018   Influenza-Unspecified 07/29/2014, 06/14/2015, 07/11/2016, 07/15/2017, 06/27/2019, 07/05/2020, 07/13/2021, 07/21/2022   Moderna Covid-19 Fall Seasonal Vaccine 69yrs & older 07/10/2023   Moderna SARS-COV2 Booster Vaccination 02/09/2022   Moderna Sars-Covid-2 Vaccination 09/26/2019, 10/24/2019, 08/02/2020   PFIZER(Purple Top)SARS-COV-2 Vaccination 09/26/2019   PPD Test 06/17/2013   Pneumococcal Conjugate-13 07/22/2017   Pneumococcal Polysaccharide-23 05/05/2009   Respiratory Syncytial Virus Vaccine,Recomb Aduvanted(Arexvy) 10/12/2022   Td 05/05/2009, 07/16/2019   Unspecified SARS-COV-2 Vaccination 02/21/2021, 06/13/2021   Zoster Recombinant(Shingrix) 09/27/2021, 12/05/2021   Zoster, Live 01/09/2011   Pertinent  Health Maintenance Due  Topic Date Due   Influenza Vaccine  04/24/2024   DEXA SCAN  Completed      11/23/2022   11:33 AM 12/17/2022    3:49 PM 02/15/2023    2:45 PM 03/20/2023   12:00 PM 04/09/2024   10:31 AM  Fall Risk  Falls in the past year? 1 0 0 0 1  Was there an injury with Fall? 1 0 0 0 1  Fall Risk Category Calculator 2 0 0 0 2  Patient at Risk for Falls Due to History of fall(s);Impaired balance/gait History of fall(s);Impaired balance/gait History of fall(s);Impaired balance/gait History of fall(s) History of fall(s)  Fall risk Follow up Falls evaluation completed Falls evaluation completed Falls evaluation completed Falls evaluation completed;Education provided;Falls prevention discussed Falls evaluation completed   Functional Status Survey:    Vitals:   06/24/24 0902 06/25/24 1147  BP: (!) 152/81 (!) 160/82  Pulse: 74    Resp: 18   Temp: 97.8 F (36.6 C)   SpO2: 96%   Weight: 125 lb 1.6 oz (56.7 kg)   Height: 5' 5 (  1.651 m)    Body mass index is 20.82 kg/m. Physical Exam Constitutional:      Appearance: Normal appearance.  HENT:     Head: Normocephalic.     Nose: Nose normal.     Mouth/Throat:     Mouth: Mucous membranes are moist.  Eyes:     Extraocular Movements: Extraocular movements intact.     Conjunctiva/sclera: Conjunctivae normal.     Pupils: Pupils are equal, round, and reactive to light.     Comments: Legally blind  Cardiovascular:     Rate and Rhythm: Normal rate and regular rhythm.     Heart sounds: No murmur heard.    Comments: DP pulses R+L present.  Pulmonary:     Effort: Pulmonary effort is normal.     Breath sounds: No rales.  Abdominal:     General: Bowel sounds are normal.     Palpations: Abdomen is soft.     Tenderness: There is no abdominal tenderness.  Genitourinary:    Comments: From previous examination a pinto bean sized cyst like lump palpated near the clitoris, asymptomatic.  Musculoskeletal:     Cervical back: Normal range of motion and neck supple.     Right lower leg: No edema.     Left lower leg: No edema.     Comments: Right shoulder has limited range of motion overhead  Skin:    General: Skin is warm and dry.  Neurological:     General: No focal deficit present.     Mental Status: She is alert. Mental status is at baseline.     Gait: Gait abnormal.     Comments: Oriented to person, place.   Psychiatric:        Mood and Affect: Mood normal.        Behavior: Behavior normal.        Thought Content: Thought content normal.     Comments: The patient appears at her baseline mentation during my examination.      Labs reviewed: Recent Labs    08/20/23 0000 06/11/24 0000  NA 140 141  K 4.1 4.8  CL 102 102  CO2 25* 31*  BUN 24* 20  CREATININE 1.1 1.0  CALCIUM 9.1 9.8   Recent Labs    08/20/23 0000 06/11/24 0000  AST 22 25  ALT 12 15   ALKPHOS 116 131*  ALBUMIN 3.9 4.6   Recent Labs    08/20/23 0000 06/11/24 0000  WBC 5.9 6.6  NEUTROABS 3,086.00 3,656.00  HGB 11.8* 13.5  HCT 36 41  PLT 347 397   Lab Results  Component Value Date   TSH 1.30 06/11/2024   Lab Results  Component Value Date   HGBA1C 5.7 06/11/2024   Lab Results  Component Value Date   CHOL 255 (A) 06/11/2024   HDL 83 (A) 06/11/2024   LDLCALC 146 06/11/2024   LDLDIRECT 190.0 03/24/2012   TRIG 131 06/11/2024   CHOLHDL 2.8 06/08/2022    Significant Diagnostic Results in last 30 days:  No results found.  Assessment/Plan Essential hypertension Sbp is elevated 150-160s, on diuretics. Bun/creat 20/1.0 06/11/24, will add Lisinopril 5mg  every day, Bp daily.   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 on Tylenol , ambulates with walker.   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 CT head was negative for acute process. Taking Memantine . MMSE 21/30 10/13/22, Vit B12 342 06/11/24  Constipation Stable, on prn MOM, prn Bisacodyl , taking MiraLax  and Senna daily.   Hyperlipidemia LDL 146 06/11/24, not taking statin.   Prediabetes Hgb A1c 5.7, diet controlled    Family/ staff Communication: plan of care reviewed with the patient and charge nurse.   Labs/tests ordered:  none

## 2024-06-24 NOTE — Assessment & Plan Note (Addendum)
 at her baseline, attempted to GDR on resumed Amitriptyline at 50mg -failed GDR, off Librium , on Venlafaxine, Fluphenazine, Clonazepam , TSH 1.3 06/11/24

## 2024-06-24 NOTE — Assessment & Plan Note (Signed)
on Tylenol, ambulates with walker 

## 2024-06-25 ENCOUNTER — Encounter: Payer: Self-pay | Admitting: Nurse Practitioner

## 2024-07-30 ENCOUNTER — Other Ambulatory Visit: Payer: Self-pay | Admitting: Nurse Practitioner

## 2024-07-30 DIAGNOSIS — F0390 Unspecified dementia without behavioral disturbance: Secondary | ICD-10-CM

## 2024-07-30 MED ORDER — CLONAZEPAM 0.25 MG PO TBDP: 0.2500 mg | ORAL_TABLET | Freq: Two times a day (BID) | ORAL | 0 refills | Status: DC

## 2024-08-05 ENCOUNTER — Non-Acute Institutional Stay (SKILLED_NURSING_FACILITY): Admitting: Family Medicine

## 2024-08-05 ENCOUNTER — Encounter: Payer: Self-pay | Admitting: Family Medicine

## 2024-08-05 DIAGNOSIS — H469 Unspecified optic neuritis: Secondary | ICD-10-CM | POA: Diagnosis not present

## 2024-08-05 DIAGNOSIS — F0390 Unspecified dementia without behavioral disturbance: Secondary | ICD-10-CM | POA: Diagnosis not present

## 2024-08-05 NOTE — Assessment & Plan Note (Signed)
 Patient's chief concern today was low vision and thinks changing her glasses would help.  Given the chronicity of her problems doubt that we could find a remedy.

## 2024-08-05 NOTE — Assessment & Plan Note (Signed)
 Appropriate for current environment in memory care.  No change in medications indicated

## 2024-08-05 NOTE — Progress Notes (Signed)
 Location:  [facility] Room Number: [name] Place of Service:  SNF [phone]) Provider:  Dr. Garnette Pinal  PCP: Charlanne Fredia CROME, MD  Patient Care Team: Charlanne Fredia CROME, MD as PCP - General (Internal Medicine) Cleatus Collar, MD (Ophthalmology) Tasia Lung, MD (Psychiatry) Mast, Man X, NP as Nurse Practitioner (Nurse Practitioner) Duwayne Purchase, MD as Consulting Physician (Orthopedic Surgery)  Extended Emergency Contact Information Primary Emergency Contact: Jean [name], [place] Phone: [phone] Relation: Son Secondary Emergency Contact: Ellie Best  [place] Home Phone: [phone] Relation: Sister  Code Status:  DNR Goals of care: Advanced Directive information    08/05/2024    3:19 PM  Advanced Directives  Does Patient Have a Medical Advance Directive? Yes  Type of Estate Agent of [place];Living will;Out of facility DNR (pink MOST or yellow form)  Does patient want to make changes to medical advance directive? No - Patient declined  Copy of Healthcare Power of Attorney in Chart? Yes - validated most recent copy scanned in chart (See row information)     Chief Complaint  Patient presents with   Medical Management of Chronic Issues    Medical Management of Chronic Issues.     HPI:  Pt is a 83 y.o. female seen today for medical management of chronic diseases.  Problems include dementia (she lives in memory care), hypertension, GERD, depression, and visual loss. Earlier this year she suffered a fracture of the right humerus.  She tells me she is still in therapy for that problem. Her chief complaint today is low vision.  She thinks it may be some new glasses would help but in reviewing her chart the problem is chronic vision loss and not related to an acuity problem. She seems well-adjusted and current living environment.  Reports no problem with  anorexia, insomnia, or joint pains.   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, 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   Vitamin D deficiency    Past Surgical History:  Procedure Laterality Date   ABDOMINAL HYSTERECTOMY     ANAL RECTAL MANOMETRY N/A 07/05/2014   Procedure: ANO RECTAL MANOMETRY;  Surgeon: Bernarda Ned, MD;  Location: [facility];  Service: Endoscopy;  Laterality: N/A;   APPENDECTOMY     CAROTID ENDARTERECTOMY     COLONOSCOPY  2008, 2015   TONSILLECTOMY     UPPER GASTROINTESTINAL ENDOSCOPY  2002    Allergies  Allergen Reactions   Biaxin [Clarithromycin] Other (See Comments)    Allergic, per [facility]   Doxycycline Other (See Comments)    Allergic, per [facility]   Fosamax [Alendronate Sodium] Other (See Comments)  Allergic, per [facility]   Geodon [Ziprasidone Hydrochloride] Other (See Comments)    Allergic, per [facility]   Lipitor [Atorvastatin Calcium] Other (See Comments)    Allergic, per [facility]   Penicillins Other (See Comments)    Allergic, per [facility]   Pravachol [Pravastatin Sodium] Other (See Comments)    Allergic, per [facility]   Statins Other (See Comments)    Allergic, per [facility]   Sulfa Antibiotics Other (See Comments)    Allergic, per [facility]   Zithromax [Azithromycin] Other (See Comments)    Allergic, per [facility]   Zocor [Simvastatin] Other (See Comments)    Allergic, per [facility]    Outpatient  Encounter Medications as of 08/05/2024  Medication Sig   acetaminophen  (TYLENOL ) 500 MG tablet Take 500-1,000 mg by mouth See admin instructions. Take 500 mg by mouth three times a day and an 1,000 mg every eight hours as needed for pain, fever, or headaches   Artificial Tears ophthalmic solution Place 1 drop into both eyes 3 (three) times daily. 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   clonazePAM  (KLONOPIN ) 0.25 MG disintegrating tablet Take 1 tablet (0.25 mg total) by mouth 2 (two) times daily. Morning and evening   fluPHENAZine (PROLIXIN) 1 MG tablet Take 1 mg by mouth at bedtime.   furosemide (LASIX) 20 MG tablet Take 20 mg by mouth daily.   ketoconazole (NIZORAL) 2 % shampoo Apply 1 application  topically See admin instructions. Shampoo on Tuesdays and Fridays   lisinopril (ZESTRIL) 5 MG tablet Take 5 mg by mouth daily.   loratadine (CLARITIN) 10 MG tablet Take 10 mg by mouth daily.   memantine  (NAMENDA ) 10 MG tablet Take 10 mg by mouth in the morning and at bedtime.   Multiple Vitamins-Minerals (SENTRY SENIOR) TABS Take 1 tablet by mouth daily with breakfast.   oxyCODONE  (OXY IR/ROXICODONE ) 5 MG immediate release tablet Take 5 mg by mouth every 8 (eight) hours as needed (for pain).   OYSTER SHELL PO Take 250 mg by mouth in the morning and at bedtime.   Polyethylene Glycol 3350  (PEG 3350 ) 17 GM/SCOOP POWD Take 17 g by mouth in the morning.   saccharomyces boulardii (DAILY PROBIOTIC SUPPLEMENT) 250 MG capsule Take 250 mg by mouth daily.   SENNOSIDES PO Take 1 tablet by mouth at bedtime.   spironolactone (ALDACTONE) 25 MG tablet Take 12.5 mg by mouth daily.   venlafaxine (EFFEXOR) 50 MG tablet Take 50 mg by mouth daily. Give 1 tablet by mouth one time a day related to DEPRESSION, UNSPECIFIED (F32.A)   bisacodyl  (DULCOLAX) 10 MG suppository Place 10 mg rectally daily as needed (for constipation). (Patient not taking:  Reported on 08/05/2024)   bisacodyl  (DULCOLAX) 5 MG EC tablet Take 5 mg by mouth daily as needed (for constipation). (Patient not taking: Reported on 08/05/2024)   carbamide peroxide (DEBROX) 6.5 % OTIC solution Place 5 drops into both ears daily as needed (for pain from ear wax impaction). (Patient not taking: Reported on 08/05/2024)   GERI-LANTA 200-200-20 MG/5ML suspension Take 30 mLs by mouth See admin instructions. Take 30 ml's by mouth at bedtime and an additional 30 ml's every four hours as needed for heartburn/indigestion (Patient not taking: Reported on 08/05/2024)   guaiFENesin (MUCINEX) 600 MG 12 hr tablet Take 600 mg by mouth daily as needed (for nasal congestion). (Patient not taking: Reported on 08/05/2024)   magnesium hydroxide (MILK OF MAGNESIA) 400  MG/5ML suspension Take 30 mLs by mouth every 4 (four) hours as needed for mild constipation. (Patient not taking: Reported on 08/05/2024)   No facility-administered encounter medications on file as of 08/05/2024.    Review of Systems  Constitutional: Negative.   HENT: Negative.    Eyes:  Positive for visual disturbance.  Respiratory: Negative.    Cardiovascular: Negative.   Gastrointestinal: Negative.   Neurological: Negative.   Psychiatric/Behavioral:  Positive for confusion.   All other systems reviewed and are negative.   Immunization History  Administered Date(s) Administered   Fluad Quad(high Dose 65+) 06/26/2023   Influenza Split 07/23/2013   Influenza Whole 07/05/2009, 06/26/2018   Influenza-Unspecified 07/29/2014, 06/14/2015, 07/11/2016, 07/15/2017, 06/27/2019, 07/05/2020, 07/13/2021, 07/21/2022   Moderna Covid-19 Fall Seasonal Vaccine 70yrs & older 07/10/2023   Moderna SARS-COV2 Booster Vaccination 02/09/2022   Moderna Sars-Covid-2 Vaccination 09/26/2019, 10/24/2019, 08/02/2020   PFIZER(Purple Top)SARS-COV-2 Vaccination 09/26/2019   PPD Test 06/17/2013   Pneumococcal Conjugate-13 07/22/2017   Pneumococcal  Polysaccharide-23 05/05/2009   Respiratory Syncytial Virus Vaccine,Recomb Aduvanted(Arexvy) 10/12/2022   Td 05/05/2009, 07/16/2019   Unspecified SARS-COV-2 Vaccination 02/21/2021, 06/13/2021   Zoster Recombinant(Shingrix) 09/27/2021, 12/05/2021   Zoster, Live 01/09/2011   Pertinent  Health Maintenance Due  Topic Date Due   Influenza Vaccine  04/24/2024   DEXA SCAN  Completed      11/23/2022   11:33 AM 12/17/2022    3:49 PM 02/15/2023    2:45 PM 03/20/2023   12:00 PM 04/09/2024   10:31 AM  Fall Risk  Falls in the past year? 1 0 0 0 1  Was there an injury with Fall? 1 0 0 0 1  Fall Risk Category Calculator 2 0 0 0 2  Patient at Risk for Falls Due to History of fall(s);Impaired balance/gait History of fall(s);Impaired balance/gait History of fall(s);Impaired balance/gait History of fall(s) History of fall(s)  Fall risk Follow up Falls evaluation completed Falls evaluation completed Falls evaluation completed Falls evaluation completed;Education provided;Falls prevention discussed Falls evaluation completed   Functional Status Survey:    Vitals:   08/05/24 1510  BP: 136/72  Pulse: 72  Resp: 18  Temp: (!) 96.1 F (35.6 C)  SpO2: 95%  Weight: 121 lb 3.2 oz (55 kg)  Height: 5' 5 (1.651 m)   Body mass index is 20.17 kg/m. Physical Exam Vitals and nursing note reviewed.  Constitutional:      Appearance: Normal appearance.  HENT:     Head: Normocephalic.     Mouth/Throat:     Mouth: Mucous membranes are moist.     Pharynx: Oropharynx is clear.  Eyes:     Pupils: Pupils are equal, round, and reactive to light.  Cardiovascular:     Rate and Rhythm: Normal rate and regular rhythm.     Pulses: Normal pulses.     Heart sounds: Normal heart sounds.  Pulmonary:     Effort: Pulmonary effort is normal.     Breath sounds: Normal breath sounds.  Abdominal:     General: Abdomen is flat. Bowel sounds are normal.     Palpations: Abdomen is soft.  Skin:    General: Skin is warm and  dry.  Neurological:     General: No focal deficit present.     Mental Status: She is alert and oriented to person, place, and time.  Psychiatric:        Mood and Affect: Mood normal.        Behavior: Behavior normal.     Labs reviewed:  Recent Labs    08/20/23 0000 06/11/24 0000  NA 140 141  K 4.1 4.8  CL 102 102  CO2 25* 31*  BUN 24* 20  CREATININE 1.1 1.0  CALCIUM 9.1 9.8   Recent Labs    08/20/23 0000 06/11/24 0000  AST 22 25  ALT 12 15  ALKPHOS 116 131*  ALBUMIN 3.9 4.6   Recent Labs    08/20/23 0000 06/11/24 0000  WBC 5.9 6.6  NEUTROABS 3,086.00 3,656.00  HGB 11.8* 13.5  HCT 36 41  PLT 347 397   Lab Results  Component Value Date   TSH 1.30 06/11/2024   Lab Results  Component Value Date   HGBA1C 5.7 06/11/2024   Lab Results  Component Value Date   CHOL 255 (A) 06/11/2024   HDL 83 (A) 06/11/2024   LDLCALC 146 06/11/2024   LDLDIRECT 190.0 03/24/2012   TRIG 131 06/11/2024   CHOLHDL 2.8 06/08/2022    Significant Diagnostic Results in last 30 days:  No results found.  Assessment/Plan Major neurocognitive disorder [facility]) Appropriate for current environment in memory care.  No change in medications indicated  Optic neuritis Patient's chief concern today was low vision and thinks changing her glasses would help.  Given the chronicity of her problems doubt that we could find a remedy.    Family/ staff Communication:   Labs/tests ordered:

## 2024-08-14 ENCOUNTER — Other Ambulatory Visit: Payer: Self-pay | Admitting: Nurse Practitioner

## 2024-08-14 DIAGNOSIS — F0390 Unspecified dementia without behavioral disturbance: Secondary | ICD-10-CM

## 2024-08-14 MED ORDER — CLONAZEPAM 0.25 MG PO TBDP: 0.2500 mg | ORAL_TABLET | Freq: Two times a day (BID) | ORAL | 0 refills | Status: DC

## 2024-09-01 ENCOUNTER — Encounter: Payer: Self-pay | Admitting: Nurse Practitioner

## 2024-09-01 ENCOUNTER — Non-Acute Institutional Stay (SKILLED_NURSING_FACILITY): Payer: Self-pay | Admitting: Nurse Practitioner

## 2024-09-01 DIAGNOSIS — K5909 Other constipation: Secondary | ICD-10-CM

## 2024-09-01 DIAGNOSIS — F323 Major depressive disorder, single episode, severe with psychotic features: Secondary | ICD-10-CM

## 2024-09-01 DIAGNOSIS — I10 Essential (primary) hypertension: Secondary | ICD-10-CM

## 2024-09-01 DIAGNOSIS — K219 Gastro-esophageal reflux disease without esophagitis: Secondary | ICD-10-CM

## 2024-09-01 DIAGNOSIS — M159 Polyosteoarthritis, unspecified: Secondary | ICD-10-CM

## 2024-09-01 DIAGNOSIS — F0390 Unspecified dementia without behavioral disturbance: Secondary | ICD-10-CM

## 2024-09-01 NOTE — Assessment & Plan Note (Signed)
 Her mood is stable,  at her baseline, attempted to GDR on resumed Amitriptyline at 50mg -failed GDR, off Librium , on Venlafaxine, Fluphenazine, Clonazepam , TSH 1.3 06/11/24

## 2024-09-01 NOTE — Assessment & Plan Note (Signed)
 No behavioral issues supportive care in memory care unit [facility] since Flu 08/2021. CT head was negative for acute process. Taking Memantine . MMSE 21/30 10/13/22, Vit B12 342 06/11/24

## 2024-09-01 NOTE — Assessment & Plan Note (Signed)
 blood pressure is controlled on diuretics. Bun/creat 20/1.0 06/11/24

## 2024-09-01 NOTE — Progress Notes (Unsigned)
 Location:   [facility] Room Number: 101 Place of Service:  SNF (31) Provider: Larwance Sharnae Winfree NP  Charlanne Fredia CROME, MD  Patient Care Team: Charlanne Fredia CROME, MD as PCP - General (Internal Medicine) Cleatus Collar, MD (Ophthalmology) Tasia Lung, MD (Psychiatry) Symia Herdt X, NP as Nurse Practitioner (Nurse Practitioner) Duwayne Purchase, MD as Consulting Physician (Orthopedic Surgery)  Extended Emergency Contact Information Primary Emergency Contact: Franchot Francis [place], [place] Phone: [phone] Relation: Son Secondary Emergency Contact: Ellie Best  [place] Home Phone: [phone] Relation: Sister  Code Status:  DNR Goals of care: Advanced Directive information    08/05/2024    3:19 PM  Advanced Directives  Does Patient Have a Medical Advance Directive? Yes  Type of Estate Agent of [place];Living will;Out of facility DNR (pink MOST or yellow form)  Does patient want to make changes to medical advance directive? No - Patient declined  Copy of Healthcare Power of Attorney in Chart? Yes - validated most recent copy scanned in chart (See row information)     Chief Complaint  Patient presents with   Medical Management of Chronic Issues    HPI:  Pt is a 83 y.o. female seen today for medical management of chronic diseases.    Closed nondisplaced fracture of the surgical neck of right humerus 04/09/2024, healed              HTN, blood pressure is controlled on diuretics. Bun/creat 20/1.0 06/11/24             OA on Tylenol , ambulates with walker.              GERD, stable, on  Maalox qhs, Hgb 13.5 06/11/24             Depression/anxiety/hallucination/delusions/false believes, at her baseline, attempted to GDR on resumed Amitriptyline at 50mg -failed GDR, off Librium , on Venlafaxine, Fluphenazine, Clonazepam , TSH 1.3 06/11/24             Dementia, supportive care in memory care unit [facility] since  Flu 08/2021. CT head was negative for acute process. Taking Memantine . MMSE 21/30 10/13/22, Vit B12 342 06/11/24             Chronic constipation, on prn MOM, prn Bisacodyl , taking MiraLax  and Senna daily.              Allergic rhinitis, takes Claritin       OP, stopped taking Prolia due to cost, ? Allergic to Fosamax.  her son declined DEXA, Vit D 41 06/11/24 Dermatitis, mostly in cheeks, intermittent, follows dermatology.  HLD LDL 146 06/11/24, allergic to statin Prediabetes Hgb A1c 5.7, diet controlled     Past Medical History:  Diagnosis Date   Allergy    Anemia    Anxiety    Asthma    CAD (coronary artery disease)    Depression    Diverticulosis    Dysfunction of eustachian tube    Elevated LFTs    Esophageal reflux    Esophageal stricture 2002   Hemorrhoids    History of rectal polyps    HLD (hyperlipidemia)    HTN (hypertension)    Hypertonicity of bladder    Irritable bowel syndrome with constipation    Obesity    Optic neuritis    Osteoporosis    Peptic ulcer disease    Peripheral neuropathy    Trigeminal neuralgia    prior  injury   Visual loss, bilateral    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, 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   Vitamin D  deficiency    Past Surgical History:  Procedure Laterality Date   ABDOMINAL HYSTERECTOMY     ANAL RECTAL MANOMETRY N/A 07/05/2014   Procedure: ANO RECTAL MANOMETRY;  Surgeon: Bernarda Ned, MD;  Location: [facility];  Service: Endoscopy;  Laterality: N/A;   APPENDECTOMY     CAROTID ENDARTERECTOMY     COLONOSCOPY  2008, 2015   TONSILLECTOMY     UPPER GASTROINTESTINAL ENDOSCOPY  2002    Allergies  Allergen Reactions   Biaxin [Clarithromycin] Other (See Comments)    Allergic, per [facility]   Doxycycline Other (See Comments)    Allergic, per [facility]   Fosamax [Alendronate Sodium] Other (See Comments)    Allergic, per [facility]   Geodon [Ziprasidone Hydrochloride] Other (See Comments)    Allergic, per [facility]   Lipitor  [Atorvastatin Calcium] Other (See Comments)    Allergic, per [facility]   Penicillins Other (See Comments)    Allergic, per [facility]   Pravachol [Pravastatin Sodium] Other (See Comments)    Allergic, per [facility]   Statins Other (See Comments)    Allergic, per [facility]   Sulfa Antibiotics Other (See Comments)    Allergic, per [facility]   Zithromax [Azithromycin] Other (See Comments)    Allergic, per [facility]   Zocor [Simvastatin] Other (See Comments)    Allergic, per [facility]    Allergies as of 09/01/2024       Reactions   Biaxin [clarithromycin] Other (See Comments)   Allergic, per [facility]   Doxycycline Other (See Comments)   Allergic, per [facility]   Fosamax [alendronate Sodium] Other (See Comments)   Allergic, per [facility]   Geodon [ziprasidone Hydrochloride] Other (See Comments)   Allergic, per [facility]   Lipitor [atorvastatin Calcium] Other (See Comments)   Allergic, per [facility]   Penicillins Other (See Comments)   Allergic, per [facility]   Pravachol [pravastatin Sodium] Other (See Comments)   Allergic, per [facility]   Statins Other (See Comments)   Allergic, per [facility]   Sulfa Antibiotics Other (See Comments)   Allergic, per [facility]   Zithromax [azithromycin] Other (See Comments)   Allergic, per [facility]   Zocor [simvastatin] Other (See Comments)   Allergic, per [facility]        Medication List        Accurate as of September 01, 2024  2:29 PM. If you have any questions, ask your nurse or doctor.          acetaminophen  500 MG tablet Commonly known as: TYLENOL  Take 500-1,000 mg by mouth See admin instructions. Take 500 mg by mouth three times a day and an 1,000 mg every eight hours as needed for pain, fever, or headaches   Artificial Tears ophthalmic solution Place 1 drop into both eyes 3 (three) times daily. 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   clonazePAM  0.25 MG disintegrating tablet Commonly known as: KLONOPIN  Take 1  tablet (0.25 mg total) by mouth 2 (two) times daily. Morning and evening   Daily Probiotic Supplement 250 MG capsule Generic drug: saccharomyces boulardii Take 250 mg by mouth daily.   fluPHENAZine 1 MG tablet Commonly known as: PROLIXIN Take 1 mg by mouth at bedtime.   furosemide 20 MG tablet Commonly known as: LASIX Take 20 mg by mouth daily.   ketoconazole 2 %  shampoo Commonly known as: NIZORAL Apply 1 application  topically See admin instructions. Shampoo on Tuesdays and Fridays   lisinopril 5 MG tablet Commonly known as: ZESTRIL Take 5 mg by mouth daily.   loratadine 10 MG tablet Commonly known as: CLARITIN Take 10 mg by mouth daily.   memantine  10 MG tablet Commonly known as: NAMENDA  Take 10 mg by mouth in the morning and at bedtime.   oxyCODONE  5 MG immediate release tablet Commonly known as: Oxy IR/ROXICODONE  Take 5 mg by mouth every 8 (eight) hours as needed (for pain).   OYSTER SHELL PO Take 250 mg by mouth in the morning and at bedtime.   PEG 3350  17 GM/SCOOP Powd Take 17 g by mouth in the morning.   SENNOSIDES PO Take 1 tablet by mouth at bedtime.   Sentry Senior Tabs Take 1 tablet by mouth daily with breakfast.   spironolactone 25 MG tablet Commonly known as: ALDACTONE Take 12.5 mg by mouth daily.   venlafaxine 50 MG tablet Commonly known as: EFFEXOR Take 50 mg by mouth daily. Give 1 tablet by mouth one time a day related to DEPRESSION, UNSPECIFIED (F32.A)        Review of Systems  Constitutional:  Negative for appetite change, fatigue and fever.  HENT:  Positive for hearing loss. Negative for congestion, rhinorrhea and voice change.   Eyes:  Positive for visual disturbance.       Low vision  Respiratory:  Negative for cough and shortness of breath.   Cardiovascular:  Negative for leg swelling.  Gastrointestinal:  Negative for abdominal pain and constipation.  Genitourinary:  Negative for dysuria, frequency and urgency.  Musculoskeletal:   Positive for arthralgias and gait problem.  Skin:  Negative for color change.  Neurological:  Negative for speech difficulty, weakness and light-headedness.       Memory lapses  Psychiatric/Behavioral:  Positive for confusion. Negative for behavioral problems, hallucinations and sleep disturbance. The patient is not nervous/anxious.        Delusion    Immunization History  Administered Date(s) Administered   Fluad Quad(high Dose 65+) 06/26/2023   Influenza Split 07/23/2013   Influenza Whole 07/05/2009, 06/26/2018   Influenza-Unspecified 07/29/2014, 06/14/2015, 07/11/2016, 07/15/2017, 06/27/2019, 07/05/2020, 07/13/2021, 07/21/2022   Moderna Covid-19 Fall Seasonal Vaccine 35yrs & older 07/10/2023   Moderna SARS-COV2 Booster Vaccination 02/09/2022   Moderna Sars-Covid-2 Vaccination 09/26/2019, 10/24/2019, 08/02/2020   PFIZER(Purple Top)SARS-COV-2 Vaccination 09/26/2019   PPD Test 06/17/2013   Pneumococcal Conjugate-13 07/22/2017   Pneumococcal Polysaccharide-23 05/05/2009   Respiratory Syncytial Virus Vaccine,Recomb Aduvanted(Arexvy) 10/12/2022   Td 05/05/2009, 07/16/2019   Unspecified SARS-COV-2 Vaccination 02/21/2021, 06/13/2021   Zoster Recombinant(Shingrix) 09/27/2021, 12/05/2021   Zoster, Live 01/09/2011   Pertinent  Health Maintenance Due  Topic Date Due   Influenza Vaccine  04/24/2024   Bone Density Scan  Completed      11/23/2022   11:33 AM 12/17/2022    3:49 PM 02/15/2023    2:45 PM 03/20/2023   12:00 PM 04/09/2024   10:31 AM  Fall Risk  Falls in the past year? 1 0 0 0 1  Was there an injury with Fall? 1  0  0  0  1   Fall Risk Category Calculator 2 0 0 0 2  Patient at Risk for Falls Due to History of fall(s);Impaired balance/gait History of fall(s);Impaired balance/gait History of fall(s);Impaired balance/gait History of fall(s) History of fall(s)  Fall risk Follow up Falls evaluation completed Falls evaluation completed Falls evaluation completed Falls  evaluation  completed;Education provided;Falls prevention discussed Falls evaluation completed     Data saved with a previous flowsheet row definition   Functional Status Survey:    Vitals:   09/01/24 1211  BP: 123/66  Pulse: 68  Resp: 17  Temp: 97.7 F (36.5 C)  SpO2: 95%  Weight: 122 lb 1.6 oz (55.4 kg)   Body mass index is 20.32 kg/m. Physical Exam Constitutional:      Appearance: Normal appearance.  HENT:     Head: Normocephalic.     Nose: Nose normal.     Mouth/Throat:     Mouth: Mucous membranes are moist.  Eyes:     Extraocular Movements: Extraocular movements intact.     Conjunctiva/sclera: Conjunctivae normal.     Pupils: Pupils are equal, round, and reactive to light.     Comments: Legally blind  Cardiovascular:     Rate and Rhythm: Normal rate and regular rhythm.     Heart sounds: No murmur heard.    Comments: DP pulses R+L present.  Pulmonary:     Effort: Pulmonary effort is normal.     Breath sounds: No rales.  Abdominal:     General: Bowel sounds are normal.     Palpations: Abdomen is soft.     Tenderness: There is no abdominal tenderness.  Genitourinary:    Comments: From previous examination a pinto bean sized cyst like lump palpated near the clitoris, asymptomatic.  Musculoskeletal:     Cervical back: Normal range of motion and neck supple.     Right lower leg: No edema.     Left lower leg: No edema.     Comments: Right shoulder has limited range of motion overhead  Skin:    General: Skin is warm and dry.  Neurological:     General: No focal deficit present.     Mental Status: She is alert. Mental status is at baseline.     Gait: Gait abnormal.     Comments: Oriented to person, place.   Psychiatric:        Mood and Affect: Mood normal.        Behavior: Behavior normal.        Thought Content: Thought content normal.     Comments: The patient appears at her baseline mentation during my examination.      Labs reviewed: Recent Labs     06/11/24 0000  NA 141  K 4.8  CL 102  CO2 31*  BUN 20  CREATININE 1.0  CALCIUM 9.8   Recent Labs    06/11/24 0000  AST 25  ALT 15  ALKPHOS 131*  ALBUMIN 4.6   Recent Labs    06/11/24 0000  WBC 6.6  NEUTROABS 3,656.00  HGB 13.5  HCT 41  PLT 397   Lab Results  Component Value Date   TSH 1.30 06/11/2024   Lab Results  Component Value Date   HGBA1C 5.7 06/11/2024   Lab Results  Component Value Date   CHOL 255 (A) 06/11/2024   HDL 83 (A) 06/11/2024   LDLCALC 146 06/11/2024   LDLDIRECT 190.0 03/24/2012   TRIG 131 06/11/2024   CHOLHDL 2.8 06/08/2022    Significant Diagnostic Results in last 30 days:  No results found.  Assessment/Plan  No problem-specific Assessment & Plan notes found for this encounter.   Family/ staff Communication: Plan of care reviewed with the patient and charge nurse  Labs/tests ordered: None

## 2024-09-01 NOTE — Assessment & Plan Note (Signed)
 stable, on  Maalox qhs, Hgb 13.5 06/11/24

## 2024-09-01 NOTE — Assessment & Plan Note (Signed)
 LDL 146 06/11/24, allergic to statin

## 2024-09-01 NOTE — Assessment & Plan Note (Signed)
 Stable  on prn MOM, prn Bisacodyl , taking MiraLax  and Senna daily.

## 2024-09-01 NOTE — Assessment & Plan Note (Signed)
 Pain is well-managed on Tylenol , ambulates with walker.

## 2024-09-15 ENCOUNTER — Other Ambulatory Visit: Payer: Self-pay | Admitting: Nurse Practitioner

## 2024-09-15 DIAGNOSIS — F0390 Unspecified dementia without behavioral disturbance: Secondary | ICD-10-CM

## 2024-09-15 MED ORDER — CLONAZEPAM 0.25 MG PO TBDP: 0.2500 mg | ORAL_TABLET | Freq: Two times a day (BID) | ORAL | 3 refills | Status: AC

## 2024-09-28 ENCOUNTER — Non-Acute Institutional Stay (SKILLED_NURSING_FACILITY): Payer: Self-pay | Admitting: Nurse Practitioner

## 2024-09-28 ENCOUNTER — Encounter: Payer: Self-pay | Admitting: Nurse Practitioner

## 2024-09-28 DIAGNOSIS — K219 Gastro-esophageal reflux disease without esophagitis: Secondary | ICD-10-CM

## 2024-09-28 DIAGNOSIS — F0390 Unspecified dementia without behavioral disturbance: Secondary | ICD-10-CM

## 2024-09-28 DIAGNOSIS — K5909 Other constipation: Secondary | ICD-10-CM | POA: Diagnosis not present

## 2024-09-28 DIAGNOSIS — R7303 Prediabetes: Secondary | ICD-10-CM

## 2024-09-28 DIAGNOSIS — M159 Polyosteoarthritis, unspecified: Secondary | ICD-10-CM | POA: Diagnosis not present

## 2024-09-28 DIAGNOSIS — F323 Major depressive disorder, single episode, severe with psychotic features: Secondary | ICD-10-CM

## 2024-09-28 DIAGNOSIS — M8000XA Age-related osteoporosis with current pathological fracture, unspecified site, initial encounter for fracture: Secondary | ICD-10-CM

## 2024-09-28 DIAGNOSIS — L309 Dermatitis, unspecified: Secondary | ICD-10-CM

## 2024-09-28 DIAGNOSIS — E785 Hyperlipidemia, unspecified: Secondary | ICD-10-CM | POA: Diagnosis not present

## 2024-09-28 DIAGNOSIS — I10 Essential (primary) hypertension: Secondary | ICD-10-CM | POA: Diagnosis not present

## 2024-09-28 DIAGNOSIS — J309 Allergic rhinitis, unspecified: Secondary | ICD-10-CM

## 2024-09-28 NOTE — Assessment & Plan Note (Signed)
 stopped taking Prolia due to cost, ? Allergic to Fosamax.  her son declined DEXA, Vit D 41 06/11/24

## 2024-09-28 NOTE — Assessment & Plan Note (Signed)
 supportive care in memory care unit [facility] since Flu 08/2021. CT head was negative for acute process. Taking Memantine . MMSE 21/30 10/13/22, Vit B12 342 06/11/24

## 2024-09-28 NOTE — Assessment & Plan Note (Signed)
 Depression/anxiety/hallucination/delusions/false believes, at her baseline, attempted to GDR on resumed Amitriptyline at 50mg -failed GDR, off Librium , on Venlafaxine, Fluphenazine, Clonazepam , TSH 1.3 06/11/24

## 2024-09-28 NOTE — Assessment & Plan Note (Signed)
 Hgb A1c 5.7, diet controlled

## 2024-09-28 NOTE — Progress Notes (Signed)
 " Location:   SNF [facility] Room Number: 101 Place of Service:  SNF (31) Provider: [facility] Icarus Partch NP  Charlanne Fredia CROME, MD  Patient Care Team: Charlanne Fredia CROME, MD as PCP - General (Internal Medicine) Cleatus Collar, MD (Ophthalmology) Tasia Lung, MD (Psychiatry) Alize Acy X, NP as Nurse Practitioner (Nurse Practitioner) Duwayne Purchase, MD as Consulting Physician (Orthopedic Surgery)  Extended Emergency Contact Information Primary Emergency Contact: Franchot Francis [place], [place] Phone: [phone] Relation: Son Secondary Emergency Contact: Ellie Best  [place] Home Phone: [phone] Relation: Sister  Code Status:  DNR Goals of care: Advanced Directive information    08/05/2024    3:19 PM  Advanced Directives  Does Patient Have a Medical Advance Directive? Yes  Type of Estate Agent of [place];Living will;Out of facility DNR (pink MOST or yellow form)  Does patient want to make changes to medical advance directive? No - Patient declined  Copy of Healthcare Power of Attorney in Chart? Yes - validated most recent copy scanned in chart (See row information)     Chief Complaint  Patient presents with   Medical Management of Chronic Issues    HPI:  Pt is a 84 y.o. female seen today for medical management of chronic diseases.     Closed nondisplaced fracture of the surgical neck of right humerus 04/09/2024, healed              HTN, blood pressure is controlled on diuretics. Bun/creat 20/1.0 06/11/24             OA on Tylenol , ambulates with walker.              GERD, stable, on  Maalox qhs, Hgb 13.5 06/11/24             Depression/anxiety/hallucination/delusions/false believes, at her baseline, attempted to GDR on resumed Amitriptyline at 50mg -failed GDR, off Librium , on Venlafaxine, Fluphenazine, Clonazepam , TSH 1.3 06/11/24             Dementia, supportive care in memory care unit [facility] since  Flu 08/2021. CT head was negative for acute process. Taking Memantine . MMSE 21/30 10/13/22, Vit B12 342 06/11/24             Chronic constipation, on prn MOM, prn Bisacodyl , taking MiraLax  and Senna daily.              Allergic rhinitis, takes Claritin       OP, stopped taking Prolia due to cost, ? Allergic to Fosamax.  her son declined DEXA, Vit D 41 06/11/24 Dermatitis, mostly in cheeks, intermittent, follows dermatology. (2% ketoconazole, 0.1% triamcinolone) HLD LDL 146 06/11/24, allergic to statin Prediabetes Hgb A1c 5.7, diet controlled  Past Medical History:  Diagnosis Date   Allergy    Anemia    Anxiety    Asthma    CAD (coronary artery disease)    Depression    Diverticulosis    Dysfunction of eustachian tube    Elevated LFTs    Esophageal reflux    Esophageal stricture 2002   Hemorrhoids    History of rectal polyps    HLD (hyperlipidemia)    HTN (hypertension)    Hypertonicity of bladder    Irritable bowel syndrome with constipation    Obesity    Optic neuritis    Osteoporosis    Peptic ulcer disease    Peripheral neuropathy    Trigeminal neuralgia  prior injury   Visual loss, bilateral    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, 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   Vitamin D  deficiency    Past Surgical History:  Procedure Laterality Date   ABDOMINAL HYSTERECTOMY     ANAL RECTAL MANOMETRY N/A 07/05/2014   Procedure: ANO RECTAL MANOMETRY;  Surgeon: Bernarda Ned, MD;  Location: [facility];  Service: Endoscopy;  Laterality: N/A;   APPENDECTOMY     CAROTID ENDARTERECTOMY     COLONOSCOPY  2008, 2015   TONSILLECTOMY     UPPER GASTROINTESTINAL ENDOSCOPY  2002    Allergies[1]  Allergies as of 09/28/2024       Reactions   Biaxin [clarithromycin] Other (See Comments)   Allergic, per [facility]   Doxycycline Other (See Comments)   Allergic, per [facility]   Fosamax [alendronate Sodium] Other (See Comments)   Allergic, per [facility]   Geodon [ziprasidone Hydrochloride] Other  (See Comments)   Allergic, per [facility]   Lipitor [atorvastatin Calcium] Other (See Comments)   Allergic, per [facility]   Penicillins Other (See Comments)   Allergic, per [facility]   Pravachol [pravastatin Sodium] Other (See Comments)   Allergic, per [facility]   Statins Other (See Comments)   Allergic, per [facility]   Sulfa Antibiotics Other (See Comments)   Allergic, per [facility]   Zithromax [azithromycin] Other (See Comments)   Allergic, per [facility]   Zocor [simvastatin] Other (See Comments)   Allergic, per [facility]        Medication List        Accurate as of September 28, 2024 11:59 PM. If you have any questions, ask your nurse or doctor.          acetaminophen  500 MG tablet Commonly known as: TYLENOL  Take 500-1,000 mg by mouth See admin instructions. Take 500 mg by mouth three times a day and an 1,000 mg every eight hours as needed for pain, fever, or headaches   Artificial Tears ophthalmic solution Place 1 drop into both eyes 3 (three) times daily. 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   clonazePAM  0.25 MG disintegrating tablet Commonly known as: KLONOPIN  Take 1 tablet (0.25 mg total) by mouth 2 (two) times daily. Morning and evening   Daily Probiotic Supplement 250 MG capsule Generic drug: saccharomyces boulardii Take 250 mg by mouth daily.   fluPHENAZine 1 MG tablet Commonly known as: PROLIXIN Take 1 mg by mouth at bedtime.   furosemide 20 MG tablet Commonly known as: LASIX Take 20 mg by mouth daily.   ketoconazole 2 % shampoo Commonly known as: NIZORAL Apply 1 application  topically See admin instructions. Shampoo on Tuesdays and Fridays   lisinopril 5 MG tablet Commonly known as: ZESTRIL Take 5 mg by mouth daily.   loratadine 10 MG tablet Commonly known as: CLARITIN Take 10 mg by mouth daily.   memantine  10 MG tablet Commonly known as: NAMENDA  Take 10 mg by mouth in the morning and at  bedtime.   oxyCODONE  5 MG immediate release tablet Commonly known as: Oxy IR/ROXICODONE  Take 5 mg by mouth every 8 (eight) hours as needed (for pain).   OYSTER SHELL PO Take 250 mg by mouth in the morning and at bedtime.   PEG 3350  17 GM/SCOOP Powd Take 17 g by mouth in the morning.   SENNOSIDES PO Take 1 tablet by mouth at bedtime.   [facility] Senior Tabs Take 1 tablet by  mouth daily with breakfast.   spironolactone 25 MG tablet Commonly known as: ALDACTONE Take 12.5 mg by mouth daily.   venlafaxine 50 MG tablet Commonly known as: EFFEXOR Take 50 mg by mouth daily. Give 1 tablet by mouth one time a day related to DEPRESSION, UNSPECIFIED (F32.A)        Review of Systems  Constitutional:  Negative for appetite change, fatigue and fever.  HENT:  Positive for hearing loss. Negative for congestion, rhinorrhea and voice change.   Eyes:  Positive for visual disturbance.       Low vision  Respiratory:  Negative for cough and shortness of breath.   Cardiovascular:  Negative for leg swelling.  Gastrointestinal:  Negative for abdominal pain and constipation.  Genitourinary:  Negative for dysuria, frequency and urgency.  Musculoskeletal:  Positive for arthralgias and gait problem.  Skin:  Positive for rash.  Neurological:  Negative for speech difficulty, weakness and light-headedness.       Memory lapses  Psychiatric/Behavioral:  Positive for confusion. Negative for behavioral problems, hallucinations and sleep disturbance. The patient is not nervous/anxious.        Delusion    Immunization History  Administered Date(s) Administered   Fluad Quad(high Dose 65+) 06/26/2023   Influenza Split 07/23/2013   Influenza Whole 07/05/2009, 06/26/2018   Influenza-Unspecified 07/29/2014, 06/14/2015, 07/11/2016, 07/15/2017, 06/27/2019, 07/05/2020, 07/13/2021, 07/21/2022   Moderna Covid-19 Fall Seasonal Vaccine 42yrs & older 07/10/2023   Moderna SARS-COV2 Booster Vaccination 02/09/2022    Moderna Sars-Covid-2 Vaccination 09/26/2019, 10/24/2019, 08/02/2020   PFIZER(Purple Top)SARS-COV-2 Vaccination 09/26/2019   PPD Test 06/17/2013   Pneumococcal Conjugate-13 07/22/2017   Pneumococcal Polysaccharide-23 05/05/2009   Respiratory Syncytial Virus Vaccine,Recomb Aduvanted(Arexvy) 10/12/2022   Td 05/05/2009, 07/16/2019   Unspecified SARS-COV-2 Vaccination 02/21/2021, 06/13/2021   Zoster Recombinant(Shingrix) 09/27/2021, 12/05/2021   Zoster, Live 01/09/2011   Pertinent  Health Maintenance Due  Topic Date Due   Influenza Vaccine  04/24/2024   Bone Density Scan  Completed      11/23/2022   11:33 AM 12/17/2022    3:49 PM 02/15/2023    2:45 PM 03/20/2023   12:00 PM 04/09/2024   10:31 AM  Fall Risk  Falls in the past year? 1 0 0 0 1  Was there an injury with Fall? 1  0  0  0  1   Fall Risk Category Calculator 2 0 0 0 2  Patient at Risk for Falls Due to History of fall(s);Impaired balance/gait History of fall(s);Impaired balance/gait History of fall(s);Impaired balance/gait History of fall(s) History of fall(s)  Fall risk Follow up Falls evaluation completed Falls evaluation completed Falls evaluation completed Falls evaluation completed;Education provided;Falls prevention discussed Falls evaluation completed     Data saved with a previous flowsheet row definition   Functional Status Survey:    Vitals:   09/28/24 1312  BP: 135/76  Pulse: 63  Resp: 17  Temp: 97.9 F (36.6 C)  SpO2: 96%  Weight: 123 lb 8 oz (56 kg)   Body mass index is 20.55 kg/m. Physical Exam Constitutional:      Appearance: Normal appearance.  HENT:     Head: Normocephalic.     Nose: Nose normal.     Mouth/Throat:     Mouth: Mucous membranes are moist.  Eyes:     Extraocular Movements: Extraocular movements intact.     Conjunctiva/sclera: Conjunctivae normal.     Pupils: Pupils are equal, round, and reactive to light.     Comments: Legally blind  Cardiovascular:  Rate and Rhythm: Normal  rate and regular rhythm.     Heart sounds: No murmur heard.    Comments: DP pulses R+L present.  Pulmonary:     Effort: Pulmonary effort is normal.     Breath sounds: No rales.  Abdominal:     General: Bowel sounds are normal.     Palpations: Abdomen is soft.     Tenderness: There is no abdominal tenderness.  Genitourinary:    Comments: From previous examination a pinto bean sized cyst like lump palpated near the clitoris, asymptomatic.  Musculoskeletal:     Cervical back: Normal range of motion and neck supple.     Right lower leg: No edema.     Left lower leg: No edema.     Comments: Right shoulder has limited range of motion overhead  Skin:    General: Skin is warm and dry.     Findings: Erythema and rash present.     Comments: cheeks  Neurological:     General: No focal deficit present.     Mental Status: She is alert. Mental status is at baseline.     Gait: Gait abnormal.     Comments: Oriented to person, place.   Psychiatric:        Mood and Affect: Mood normal.        Behavior: Behavior normal.        Thought Content: Thought content normal.     Comments: The patient appears at her baseline mentation during my examination.      Labs reviewed: Recent Labs    06/11/24 0000  NA 141  K 4.8  CL 102  CO2 31*  BUN 20  CREATININE 1.0  CALCIUM 9.8   Recent Labs    06/11/24 0000  AST 25  ALT 15  ALKPHOS 131*  ALBUMIN 4.6   Recent Labs    06/11/24 0000  WBC 6.6  NEUTROABS 3,656.00  HGB 13.5  HCT 41  PLT 397   Lab Results  Component Value Date   TSH 1.30 06/11/2024   Lab Results  Component Value Date   HGBA1C 5.7 06/11/2024   Lab Results  Component Value Date   CHOL 255 (A) 06/11/2024   HDL 83 (A) 06/11/2024   LDLCALC 146 06/11/2024   LDLDIRECT 190.0 03/24/2012   TRIG 131 06/11/2024   CHOLHDL 2.8 06/08/2022    Significant Diagnostic Results in last 30 days:  No results found.  Assessment/Plan  Dermatitis mostly in cheeks, intermittent,  follows dermatology. (2% ketoconazole, 0.1% triamcinolone)  Hyperlipidemia LDL 146 06/11/24, allergic to statin  Prediabetes Hgb A1c 5.7, diet controlled  Osteoporosis stopped taking Prolia due to cost, ? Allergic to Fosamax.  her son declined DEXA, Vit D 41 06/11/24  Allergic rhinitis  takes Claritin        Constipation  on prn MOM, prn Bisacodyl , taking MiraLax  and Senna daily.   Major neurocognitive disorder (HCC) supportive care in memory care unit [facility] since Flu 08/2021. CT head was negative for acute process. Taking Memantine . 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   Essential hypertension  blood pressure is controlled on diuretics. Bun/creat 20/1.0 06/11/24  Family/ staff Communication: Plan of care reviewed with the patient and charge nurse  Labs/tests ordered: None       [1]  Allergies Allergen Reactions   Biaxin [Clarithromycin] Other (See Comments)    Allergic, per [facility]   Doxycycline Other (See Comments)    Allergic, per [facility]   Fosamax [Alendronate Sodium] Other (See Comments)    Allergic, per [facility]   Geodon [Ziprasidone Hydrochloride] Other (See Comments)    Allergic, per [facility]   Lipitor [Atorvastatin Calcium] Other (See Comments)    Allergic, per [facility]   Penicillins Other (See Comments)    Allergic, per [facility]   Pravachol [Pravastatin Sodium] Other (See Comments)    Allergic, per [facility]   Statins Other (See Comments)    Allergic, per [facility]   Sulfa Antibiotics Other (See Comments)    Allergic, per [facility]   Zithromax [Azithromycin]  Other (See Comments)    Allergic, per [facility]   Zocor [Simvastatin] Other (See Comments)    Allergic, per [facility]   "

## 2024-09-28 NOTE — Assessment & Plan Note (Signed)
 mostly in cheeks, intermittent, follows dermatology. (2% ketoconazole, 0.1% triamcinolone)

## 2024-09-28 NOTE — Assessment & Plan Note (Signed)
 stable, on  Maalox qhs, Hgb 13.5 06/11/24

## 2024-09-28 NOTE — Assessment & Plan Note (Signed)
 on prn MOM, prn Bisacodyl, taking MiraLax and Senna daily.

## 2024-09-28 NOTE — Assessment & Plan Note (Signed)
takes Claritin

## 2024-09-28 NOTE — Assessment & Plan Note (Signed)
 LDL 146 06/11/24, allergic to statin

## 2024-09-28 NOTE — Assessment & Plan Note (Signed)
on Tylenol, ambulates with walker 

## 2024-09-28 NOTE — Assessment & Plan Note (Signed)
 blood pressure is controlled on diuretics. Bun/creat 20/1.0 06/11/24

## 2024-10-07 ENCOUNTER — Encounter: Payer: Self-pay | Admitting: Adult Health

## 2024-10-07 ENCOUNTER — Non-Acute Institutional Stay (SKILLED_NURSING_FACILITY): Admitting: Adult Health

## 2024-10-07 DIAGNOSIS — I10 Essential (primary) hypertension: Secondary | ICD-10-CM | POA: Diagnosis not present

## 2024-10-07 DIAGNOSIS — F0390 Unspecified dementia without behavioral disturbance: Secondary | ICD-10-CM

## 2024-10-07 DIAGNOSIS — F323 Major depressive disorder, single episode, severe with psychotic features: Secondary | ICD-10-CM | POA: Diagnosis not present

## 2024-10-07 NOTE — Progress Notes (Signed)
 "  Location:  [facility] Room Number: [place] CERT [phone] [id number] Place of Service:  SNF (31) Provider:  Medina-Vargas, Telly Jawad, DNP, FNP-BC  Patient Care Team: Charlanne Fredia CROME, MD as PCP - General (Internal Medicine) Cleatus Collar, MD (Ophthalmology) Tasia Lung, MD (Psychiatry) Mast, Man X, NP as Nurse Practitioner (Nurse Practitioner) Duwayne Purchase, MD as Consulting Physician (Orthopedic Surgery)  Extended Emergency Contact Information Primary Emergency Contact: Franchot Francis [place], [place] Phone: [phone] Relation: Son Secondary Emergency Contact: Ellie Best  [place] Home Phone: [phone] Relation: Sister  Code Status:  DNR  Goals of care: Advanced Directive information    10/07/2024   11:04 AM  Advanced Directives  Does Patient Have a Medical Advance Directive? Yes  Type of Advance Directive Out of facility DNR (pink MOST or yellow form)  Does patient want to make changes to medical advance directive? No - Patient declined     Chief Complaint  Patient presents with   Acute Visit    Elevated BP    HPI:  Pt is a 84 y.o. female seen today for an acute visit regarding elevated BP. She is a resident of [facility] memory care unit. She was seen sitting in the lounge area. She was alert and pleasant. SBP ranging from 116 to 207, with outlier 97. She is currently taking Lisinopril 5 mg daily. She denies headache nor dizziness. She also takes Spironolactone 12.5 mg daily and Furosemide 20 mg daily for hypertension.  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 Mood has been stable.  She has dementia with latest BIMS score 14/15. She takes Memantine  10 mg BID.    Past Medical History:  Diagnosis Date   Allergy    Anemia    Anxiety    Asthma    CAD (coronary artery disease)    Depression    Diverticulosis     Dysfunction of eustachian tube    Elevated LFTs    Esophageal reflux    Esophageal stricture 2002   Hemorrhoids    History of rectal polyps    HLD (hyperlipidemia)    HTN (hypertension)    Hypertonicity of bladder    Irritable bowel syndrome with constipation    Obesity    Optic neuritis    Osteoporosis    Peptic ulcer disease    Peripheral neuropathy    Trigeminal neuralgia    prior injury   Visual loss, bilateral    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, 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   Vitamin D  deficiency    Past Surgical History:  Procedure Laterality Date   ABDOMINAL HYSTERECTOMY     ANAL RECTAL MANOMETRY N/A 07/05/2014   Procedure: ANO RECTAL MANOMETRY;  Surgeon: Bernarda Ned, MD;  Location: [facility];  Service: Endoscopy;  Laterality: N/A;   APPENDECTOMY     CAROTID ENDARTERECTOMY     COLONOSCOPY  2008, 2015   TONSILLECTOMY     UPPER GASTROINTESTINAL ENDOSCOPY  2002    Allergies[1]  Outpatient Encounter Medications as of 10/07/2024  Medication Sig   acetaminophen  (TYLENOL ) 500 MG tablet Take 500 mg by mouth every 8 (eight) hours as needed. Give 2 tablet orally every 8 hours as needed for elevated temperature. Not to exceed 3000 mg/24 hours   Artificial Tears ophthalmic solution Place 1 drop into both eyes 3 (three) times daily. related  to Dry eye syndrome of unspecified lacrimal gland   aspirin  81 MG chewable tablet Chew 81 mg by mouth daily.   clonazePAM  (KLONOPIN ) 0.25 MG disintegrating tablet Take 1 tablet (0.25 mg total) by mouth 2 (two) times daily. Morning and evening   fluPHENAZine (PROLIXIN) 1 MG tablet Take 1 mg by mouth at bedtime.   furosemide (LASIX) 20 MG tablet Take 20 mg by mouth daily.   ketoconazole (NIZORAL) 2 % cream Apply 1 Application topically 2 (two) times daily.   ketoconazole (NIZORAL) 2 % shampoo Apply 1 application  topically See admin instructions. Shampoo on Tuesdays and Fridays   lisinopril (ZESTRIL) 5 MG tablet Take 5 mg by mouth  daily.   loratadine (CLARITIN) 10 MG tablet Take 10 mg by mouth daily.   memantine  (NAMENDA ) 10 MG tablet Take 10 mg by mouth in the morning and at bedtime.   Multiple Vitamins-Minerals (SENTRY SENIOR) TABS Take 1 tablet by mouth daily with breakfast.   OYSTER SHELL PO Take 250 mg by mouth in the morning and at bedtime.   Polyethylene Glycol 3350  (PEG 3350 ) 17 GM/SCOOP POWD Take 17 g by mouth in the morning.   predniSONE (DELTASONE) 20 MG tablet Take 20 mg by mouth daily.   saccharomyces boulardii (DAILY PROBIOTIC SUPPLEMENT) 250 MG capsule Take 250 mg by mouth daily.   SENNOSIDES PO Take 1 tablet by mouth at bedtime.   spironolactone (ALDACTONE) 25 MG tablet Take 12.5 mg by mouth daily.   triamcinolone cream (KENALOG) 0.1 % Apply 1 Application topically 2 (two) times daily. Every morning and at bedtime for rash   venlafaxine (EFFEXOR) 50 MG tablet Take 50 mg by mouth daily. Give 1 tablet by mouth one time a day related to DEPRESSION, UNSPECIFIED (F32.A)   oxyCODONE  (OXY IR/ROXICODONE ) 5 MG immediate release tablet Take 5 mg by mouth every 8 (eight) hours as needed (for pain). (Patient not taking: Reported on 10/07/2024)   No facility-administered encounter medications on file as of 10/07/2024.    Review of Systems  Constitutional:  Negative for appetite change, chills, fatigue and fever.  HENT:  Negative for congestion, hearing loss, rhinorrhea and sore throat.   Eyes: Negative.   Respiratory:  Negative for cough, shortness of breath and wheezing.   Cardiovascular:  Negative for chest pain, palpitations and leg swelling.  Gastrointestinal:  Negative for abdominal pain, constipation, diarrhea, nausea and vomiting.  Genitourinary:  Negative for dysuria.  Musculoskeletal:  Negative for arthralgias, back pain and myalgias.  Skin:  Negative for color change, rash and wound.  Neurological:  Negative for dizziness, weakness and headaches.  Psychiatric/Behavioral:  Negative for behavioral  problems. The patient is not nervous/anxious.       Immunization History  Administered Date(s) Administered   Fluad Quad(high Dose 65+) 06/26/2023   Influenza Split 07/23/2013   Influenza Whole 07/05/2009, 06/26/2018   Influenza-Unspecified 07/29/2014, 06/14/2015, 07/11/2016, 07/15/2017, 06/27/2019, 07/05/2020, 07/13/2021, 07/21/2022   Moderna Covid-19 Fall Seasonal Vaccine 32yrs & older 07/10/2023   Moderna SARS-COV2 Booster Vaccination 02/09/2022   Moderna Sars-Covid-2 Vaccination 09/26/2019, 10/24/2019, 08/02/2020   PFIZER(Purple Top)SARS-COV-2 Vaccination 09/26/2019   PPD Test 06/17/2013   Pneumococcal Conjugate-13 07/22/2017   Pneumococcal Polysaccharide-23 05/05/2009   Respiratory Syncytial Virus Vaccine,Recomb Aduvanted(Arexvy) 10/12/2022   Td 05/05/2009, 07/16/2019   Unspecified SARS-COV-2 Vaccination 02/21/2021, 06/13/2021   Zoster Recombinant(Shingrix) 09/27/2021, 12/05/2021   Zoster, Live 01/09/2011   Pertinent  Health Maintenance Due  Topic Date Due   Influenza Vaccine  04/24/2024   Bone Density Scan  Completed      11/23/2022   11:33 AM 12/17/2022    3:49 PM 02/15/2023    2:45 PM 03/20/2023   12:00 PM 04/09/2024   10:31 AM  Fall Risk  Falls in the past year? 1 0 0 0 1  Was there an injury with Fall? 1  0  0  0  1   Fall Risk Category Calculator 2 0 0 0 2  Patient at Risk for Falls Due to History of fall(s);Impaired balance/gait History of fall(s);Impaired balance/gait History of fall(s);Impaired balance/gait History of fall(s) History of fall(s)  Fall risk Follow up Falls evaluation completed Falls evaluation completed Falls evaluation completed Falls evaluation completed;Education provided;Falls prevention discussed Falls evaluation completed     Data saved with a previous flowsheet row definition     Vitals:   10/07/24 1042  BP: (!) 179/86  Pulse: 67  Resp: 17  Temp: 97.9 F (36.6 C)  SpO2: 96%  Weight: 123 lb 8 oz (56 kg)   Body mass index is 20.55  kg/m.  Physical Exam Constitutional:      General: She is not in acute distress.    Appearance: Normal appearance.  HENT:     Head: Normocephalic and atraumatic.     Nose: Nose normal.     Mouth/Throat:     Mouth: Mucous membranes are moist.  Eyes:     Conjunctiva/sclera: Conjunctivae normal.  Cardiovascular:     Rate and Rhythm: Normal rate and regular rhythm.  Pulmonary:     Effort: Pulmonary effort is normal.     Breath sounds: Normal breath sounds.  Abdominal:     General: Bowel sounds are normal.     Palpations: Abdomen is soft.  Musculoskeletal:        General: Normal range of motion.     Cervical back: Normal range of motion.  Skin:    General: Skin is warm and dry.  Neurological:     Mental Status: She is alert.  Psychiatric:        Mood and Affect: Mood normal.        Behavior: Behavior normal.      Labs reviewed: Recent Labs    06/11/24 0000  NA 141  K 4.8  CL 102  CO2 31*  BUN 20  CREATININE 1.0  CALCIUM 9.8   Recent Labs    06/11/24 0000  AST 25  ALT 15  ALKPHOS 131*  ALBUMIN 4.6   Recent Labs    06/11/24 0000  WBC 6.6  NEUTROABS 3,656.00  HGB 13.5  HCT 41  PLT 397   Lab Results  Component Value Date   TSH 1.30 06/11/2024   Lab Results  Component Value Date   HGBA1C 5.7 06/11/2024   Lab Results  Component Value Date   CHOL 255 (A) 06/11/2024   HDL 83 (A) 06/11/2024   LDLCALC 146 06/11/2024   LDLDIRECT 190.0 03/24/2012   TRIG 131 06/11/2024   CHOLHDL 2.8 06/08/2022    Significant Diagnostic Results in last 30 days:  No results found.  Assessment/Plan  1. Essential hypertension (Primary) -  BP has been elevated -  will increase Lisinopril from 5 mg daily to 10 mg daily -  continue Spironolactone 12.5 mg daily -  continue Furosemide 20 mg daily -  monitor BP  2. Depression, psychotic (HCC) -  mood is stable -  continue Venlafaxine 50 mg daily -  continue Fluphenazine 1 mg at bedtime  3. Senile dementia  (HCC) -  BIMS score 14/15 -  continue Memantine  10 mg BID    Family/ staff Communication: Discussed plan of care with resident and charge nurse.  Labs/tests ordered:  None    Esmeralda Malay Medina-Vargas, DNP, MSN, FNP-BC [facility] [phone] (Monday-Friday 8:00 a.m. - 5:00 p.m.) [phone] (after hours)      [1]  Allergies Allergen Reactions   Biaxin [Clarithromycin] Other (See Comments)    Allergic, per [facility]   Doxycycline Other (See Comments)    Allergic, per [facility]   Fosamax [Alendronate Sodium] Other (See Comments)    Allergic, per [facility]   Geodon [Ziprasidone Hydrochloride] Other (See Comments)    Allergic, per [facility]   Lipitor [Atorvastatin Calcium] Other (See Comments)    Allergic, per [facility]   Penicillins Other (See Comments)    Allergic, per [facility]   Pravachol [Pravastatin Sodium] Other (See Comments)    Allergic, per [facility]   Statins Other (See Comments)    Allergic, per [facility]   Sulfa Antibiotics Other (See Comments)    Allergic, per [facility]   Zithromax [Azithromycin] Other (See Comments)    Allergic, per [facility]   Zocor [Simvastatin] Other (See Comments)    Allergic, per [facility]   "
